# Patient Record
Sex: Male | Born: 1950 | Race: Black or African American | Hispanic: No | Smoking: Former smoker
Health system: Southern US, Community
[De-identification: ages and names within clinical notes are randomized; demographics above are authoritative.]

## PROBLEM LIST (undated history)

## (undated) DIAGNOSIS — I509 Heart failure, unspecified: Secondary | ICD-10-CM

## (undated) DIAGNOSIS — I10 Essential (primary) hypertension: Secondary | ICD-10-CM

## (undated) DIAGNOSIS — F1410 Cocaine abuse, uncomplicated: Secondary | ICD-10-CM

## (undated) DIAGNOSIS — F1010 Alcohol abuse, uncomplicated: Secondary | ICD-10-CM

## (undated) DIAGNOSIS — R0600 Dyspnea, unspecified: Secondary | ICD-10-CM

## (undated) DIAGNOSIS — L0291 Cutaneous abscess, unspecified: Secondary | ICD-10-CM

## (undated) DIAGNOSIS — M51369 Other intervertebral disc degeneration, lumbar region without mention of lumbar back pain or lower extremity pain: Secondary | ICD-10-CM

## (undated) DIAGNOSIS — N400 Enlarged prostate without lower urinary tract symptoms: Secondary | ICD-10-CM

## (undated) DIAGNOSIS — D649 Anemia, unspecified: Secondary | ICD-10-CM

## (undated) DIAGNOSIS — E119 Type 2 diabetes mellitus without complications: Secondary | ICD-10-CM

## (undated) DIAGNOSIS — K219 Gastro-esophageal reflux disease without esophagitis: Secondary | ICD-10-CM

## (undated) DIAGNOSIS — R569 Unspecified convulsions: Secondary | ICD-10-CM

## (undated) DIAGNOSIS — M48061 Spinal stenosis, lumbar region without neurogenic claudication: Secondary | ICD-10-CM

## (undated) DIAGNOSIS — M5136 Other intervertebral disc degeneration, lumbar region: Secondary | ICD-10-CM

## (undated) HISTORY — PX: BACK SURGERY: SHX140

---

## 1998-12-01 ENCOUNTER — Encounter: Payer: Self-pay | Admitting: Emergency Medicine

## 1998-12-01 ENCOUNTER — Emergency Department (HOSPITAL_COMMUNITY): Admission: EM | Admit: 1998-12-01 | Discharge: 1998-12-01 | Payer: Self-pay | Admitting: Emergency Medicine

## 1998-12-27 ENCOUNTER — Encounter: Payer: Self-pay | Admitting: Neurological Surgery

## 1998-12-27 ENCOUNTER — Ambulatory Visit (HOSPITAL_COMMUNITY): Admission: RE | Admit: 1998-12-27 | Discharge: 1998-12-27 | Payer: Self-pay | Admitting: Neurological Surgery

## 1999-01-15 ENCOUNTER — Encounter: Payer: Self-pay | Admitting: Neurological Surgery

## 1999-01-17 ENCOUNTER — Encounter: Payer: Self-pay | Admitting: Neurological Surgery

## 1999-01-17 ENCOUNTER — Inpatient Hospital Stay (HOSPITAL_COMMUNITY): Admission: RE | Admit: 1999-01-17 | Discharge: 1999-01-17 | Payer: Self-pay | Admitting: Neurological Surgery

## 1999-05-26 ENCOUNTER — Encounter: Payer: Self-pay | Admitting: Neurological Surgery

## 1999-05-26 ENCOUNTER — Ambulatory Visit (HOSPITAL_COMMUNITY): Admission: RE | Admit: 1999-05-26 | Discharge: 1999-05-26 | Payer: Self-pay | Admitting: Neurological Surgery

## 2001-01-06 ENCOUNTER — Emergency Department (HOSPITAL_COMMUNITY): Admission: EM | Admit: 2001-01-06 | Discharge: 2001-01-06 | Payer: Self-pay | Admitting: Emergency Medicine

## 2002-08-14 ENCOUNTER — Emergency Department (HOSPITAL_COMMUNITY): Admission: EM | Admit: 2002-08-14 | Discharge: 2002-08-14 | Payer: Self-pay | Admitting: Emergency Medicine

## 2006-07-03 ENCOUNTER — Emergency Department (HOSPITAL_COMMUNITY): Admission: EM | Admit: 2006-07-03 | Discharge: 2006-07-03 | Payer: Self-pay | Admitting: Family Medicine

## 2006-07-06 ENCOUNTER — Emergency Department (HOSPITAL_COMMUNITY): Admission: EM | Admit: 2006-07-06 | Discharge: 2006-07-06 | Payer: Self-pay | Admitting: Emergency Medicine

## 2006-07-07 ENCOUNTER — Observation Stay (HOSPITAL_COMMUNITY): Admission: EM | Admit: 2006-07-07 | Discharge: 2006-07-08 | Payer: Self-pay | Admitting: Emergency Medicine

## 2006-08-06 ENCOUNTER — Emergency Department (HOSPITAL_COMMUNITY): Admission: EM | Admit: 2006-08-06 | Discharge: 2006-08-07 | Payer: Self-pay | Admitting: Emergency Medicine

## 2007-01-07 ENCOUNTER — Emergency Department (HOSPITAL_COMMUNITY): Admission: EM | Admit: 2007-01-07 | Discharge: 2007-01-07 | Payer: Self-pay | Admitting: Family Medicine

## 2007-01-09 ENCOUNTER — Emergency Department (HOSPITAL_COMMUNITY): Admission: EM | Admit: 2007-01-09 | Discharge: 2007-01-09 | Payer: Self-pay | Admitting: Emergency Medicine

## 2007-01-11 ENCOUNTER — Emergency Department (HOSPITAL_COMMUNITY): Admission: EM | Admit: 2007-01-11 | Discharge: 2007-01-11 | Payer: Self-pay | Admitting: Family Medicine

## 2007-01-15 ENCOUNTER — Emergency Department (HOSPITAL_COMMUNITY): Admission: EM | Admit: 2007-01-15 | Discharge: 2007-01-15 | Payer: Self-pay | Admitting: Family Medicine

## 2011-11-18 ENCOUNTER — Emergency Department (HOSPITAL_COMMUNITY)
Admission: EM | Admit: 2011-11-18 | Discharge: 2011-11-18 | Disposition: A | Discharge status: Home / Self Care | Payer: Self-pay | Attending: Emergency Medicine | Admitting: Emergency Medicine

## 2011-11-18 ENCOUNTER — Encounter (HOSPITAL_COMMUNITY): Payer: Self-pay | Admitting: *Deleted

## 2011-11-18 DIAGNOSIS — M5430 Sciatica, unspecified side: Secondary | ICD-10-CM | POA: Insufficient documentation

## 2011-11-18 DIAGNOSIS — I10 Essential (primary) hypertension: Secondary | ICD-10-CM | POA: Insufficient documentation

## 2011-11-18 DIAGNOSIS — R209 Unspecified disturbances of skin sensation: Secondary | ICD-10-CM | POA: Insufficient documentation

## 2011-11-18 DIAGNOSIS — F17200 Nicotine dependence, unspecified, uncomplicated: Secondary | ICD-10-CM | POA: Insufficient documentation

## 2011-11-18 DIAGNOSIS — Z79899 Other long term (current) drug therapy: Secondary | ICD-10-CM | POA: Insufficient documentation

## 2011-11-18 DIAGNOSIS — M549 Dorsalgia, unspecified: Secondary | ICD-10-CM | POA: Insufficient documentation

## 2011-11-18 HISTORY — DX: Gastro-esophageal reflux disease without esophagitis: K21.9

## 2011-11-18 HISTORY — DX: Essential (primary) hypertension: I10

## 2011-11-18 HISTORY — DX: Benign prostatic hyperplasia without lower urinary tract symptoms: N40.0

## 2011-11-18 MED ORDER — DEXAMETHASONE SODIUM PHOSPHATE 10 MG/ML IJ SOLN
10.0000 mg | Freq: Once | INTRAMUSCULAR | Status: DC
  Filled 2011-11-18: qty 1

## 2011-11-18 MED ORDER — METHOCARBAMOL 500 MG PO TABS
500.0000 mg | ORAL_TABLET | Freq: Once | ORAL | Status: AC
  Administered 2011-11-18: 500 mg via ORAL
  Filled 2011-11-18: qty 1

## 2011-11-18 MED ORDER — IBUPROFEN 800 MG PO TABS: 800.0000 mg | ORAL_TABLET | Freq: Three times a day (TID) | ORAL | Status: AC

## 2011-11-18 MED ORDER — METHOCARBAMOL 500 MG PO TABS: 500.0000 mg | ORAL_TABLET | Freq: Two times a day (BID) | ORAL | Status: AC

## 2011-11-18 MED ORDER — HYDROCODONE-ACETAMINOPHEN 5-325 MG PO TABS: 1.0000 | ORAL_TABLET | Freq: Four times a day (QID) | ORAL | Status: AC | PRN

## 2011-11-18 MED ORDER — KETOROLAC TROMETHAMINE 60 MG/2ML IM SOLN
60.0000 mg | Freq: Once | INTRAMUSCULAR | Status: DC
  Filled 2011-11-18: qty 2

## 2011-11-18 MED ORDER — TRAMADOL HCL 50 MG PO TABS: 50.0000 mg | ORAL_TABLET | Freq: Four times a day (QID) | ORAL | Status: AC | PRN

## 2011-11-18 NOTE — ED Provider Notes (Signed)
History     CSN: [id number]  Arrival date & time 11/18/11  1033   First MD Initiated Contact with Patient 11/18/11 1204     HPI Patient reports a significant history of back pain and neck pain. Reports back pain for 8 years has been intermittent. Reports most recent exacerbation was 2 and half weeks ago. Pain is severe in his lower back and radiates down bilateral lower extremities. Denies new injury. Reports associated with numbness sensation mid back. Reports sharp shooting pains down bilateral lower extremities. Denies saddle anesthesias, perineal numbness, abdominal pain, incontinence, urinary symptoms. Reports numbness and tingling in bilateral feet. Patient is a 61 y.o. male presenting with back pain. The history is provided by the patient.  Back Pain  This is a chronic problem. The problem occurs constantly. The problem has been gradually worsening. The pain is associated with no known injury. The pain is present in the lumbar spine. The quality of the pain is described as shooting, stabbing and burning. The pain radiates to the left thigh and right thigh. The pain is severe. The symptoms are aggravated by certain positions. Associated symptoms include numbness, leg pain, paresthesias and tingling. Pertinent negatives include no chest pain, no fever, no headaches, no abdominal pain, no bowel incontinence, no perianal numbness, no bladder incontinence, no dysuria, no pelvic pain, no paresis and no weakness. He has tried NSAIDs for the symptoms. The treatment provided mild relief.    Past Medical History  Diagnosis Date  . Hypertension   . Acid reflux   . Enlarged prostate     Past Surgical History  Procedure Date  . Back surgery     cervical    No family history on file.  History  Substance Use Topics  . Smoking status: Current Everyday Smoker -- 0.5 packs/day  . Smokeless tobacco: Not on file  . Alcohol Use: 7.2 oz/week    12 Cans of beer per week      Review of Systems    Constitutional: Negative for fever and chills.  HENT: Negative for neck pain.   Respiratory: Negative for cough and shortness of breath.   Cardiovascular: Negative for chest pain and palpitations.  Gastrointestinal: Negative for nausea, vomiting, abdominal pain and bowel incontinence.  Genitourinary: Negative for bladder incontinence, dysuria, hematuria, flank pain and pelvic pain.  Musculoskeletal: Positive for back pain. Negative for myalgias and gait problem.       Denies saddle anesthesias, perineal numbness, bowel incontinence, urinary incontinence  Neurological: Positive for tingling, numbness and paresthesias. Negative for dizziness, weakness and headaches.  All other systems reviewed and are negative.    Allergies  Review of patient's allergies indicates no known allergies.  Home Medications   Current Outpatient Rx  Name Route Sig Dispense Refill  . DOXAZOSIN MESYLATE 8 MG PO TABS Oral Take 4 mg by mouth at bedtime. Takes 1/2 tablet    . RANITIDINE HCL 150 MG PO TABS Oral Take 150 mg by mouth 2 (two) times daily.    . TRAMADOL HCL 50 MG PO TABS Oral Take 50 mg by mouth every 6 (six) hours as needed. Pain    . TRIAMTERENE-HCTZ 75-50 MG PO TABS Oral Take 1 tablet by mouth daily.      BP 159/95  Pulse 108  Temp(Src) 98.4 F (36.9 C) (Oral)  Resp 20  Wt 249 lb (112.946 kg)  SpO2 98%  Physical Exam  Constitutional: He is oriented to person, place, and time. He appears well-developed and well-nourished.  HENT:  Head: Normocephalic and atraumatic.  Eyes: Conjunctivae are normal. Pupils are equal, round, and reactive to light.  Neck: Normal range of motion. Neck supple.  Cardiovascular: Normal rate, regular rhythm and normal heart sounds.   Pulmonary/Chest: Effort normal and breath sounds normal.  Abdominal: Soft. Bowel sounds are normal.  Musculoskeletal:       Lumbar back: He exhibits decreased range of motion (patient only able to flex approximately 45.). He exhibits  no tenderness, no bony tenderness, no swelling, no edema, no deformity, no laceration, no pain, no spasm and normal pulse.       Back:  Neurological: He is alert and oriented to person, place, and time.  Skin: Skin is warm and dry. No rash noted. No erythema. No pallor.  Psychiatric: He has a normal mood and affect. His behavior is normal.    ED Course  Procedures  MDM   Due to patient's report of numbness of back, which is a new finding recommended MRI. Will try to order outpatient MRI. Treat with Ultram, Robaxin, prednisone pack. The patient had surgery years ago in 1995 he followed up with Dr. Ellene Route. Will refer back to Dr. Ellene Route. Patient agrees to plan and is ready for discharge.     Sheliah Mends, PA-C 11/18/11 1258

## 2011-11-18 NOTE — Discharge Instructions (Signed)
You have an MRI schelduled for Thursday, 11/20/11 @ 11:45a. Please call back one day after your MRI for results. Followup with Dr. Ellene Route for further management your back pain. You should call and make an appointment.  Back Pain, Adult Low back pain is very common. About 1 in 5 people have back pain.The cause of low back pain is rarely dangerous. The pain often gets better over time.About half of people with a sudden onset of back pain feel better in just 2 weeks. About 8 in 10 people feel better by 6 weeks.  CAUSES Some common causes of back pain include:  Strain of the muscles or ligaments supporting the spine.   Wear and tear (degeneration) of the spinal discs.   Arthritis.   Direct injury to the back.  DIAGNOSIS Most of the time, the direct cause of low back pain is not known.However, back pain can be treated effectively even when the exact cause of the pain is unknown.Answering your caregiver's questions about your overall health and symptoms is one of the most accurate ways to make sure the cause of your pain is not dangerous. If your caregiver needs more information, he or she may order lab work or imaging tests (X-rays or MRIs).However, even if imaging tests show changes in your back, this usually does not require surgery. HOME CARE INSTRUCTIONS For many people, back pain returns.Since low back pain is rarely dangerous, it is often a condition that people can learn to [facility] - [facility] their own.   Remain active. It is stressful on the back to sit or stand in one place. Do not sit, drive, or stand in one place for more than 30 minutes at a time. Take short walks on level surfaces as soon as pain allows.Try to increase the length of time you walk each day.   Do not stay in bed.Resting more than 1 or 2 days can delay your recovery.   Do not avoid exercise or work.Your body is made to move.It is not dangerous to be active, even though your back may hurt.Your back will likely heal faster  if you return to being active before your pain is gone.   Pay attention to your body when you bend and lift. Many people have less discomfortwhen lifting if they bend their knees, keep the load close to their bodies,and avoid twisting. Often, the most comfortable positions are those that put less stress on your recovering back.   Find a comfortable position to sleep. Use a firm mattress and lie on your side with your knees slightly bent. If you lie on your back, put a pillow under your knees.   Only take over-the-counter or prescription medicines as directed by your caregiver. Over-the-counter medicines to reduce pain and inflammation are often the most helpful.Your caregiver may prescribe muscle relaxant drugs.These medicines help dull your pain so you can more quickly return to your normal activities and healthy exercise.   Put ice on the injured area.   Put ice in a plastic bag.   Place a towel between your skin and the bag.   Leave the ice on for 15 to 20 minutes, 3 to 4 times a day for the first 2 to 3 days. After that, ice and heat may be alternated to reduce pain and spasms.   Ask your caregiver about trying back exercises and gentle massage. This may be of some benefit.   Avoid feeling anxious or stressed.Stress increases muscle tension and can worsen back pain.It is important to  recognize when you are anxious or stressed and learn ways to manage it.Exercise is a great option.  SEEK MEDICAL CARE IF:  You have pain that is not relieved with rest or medicine.   You have pain that does not improve in 1 week.   You have new symptoms.   You are generally not feeling well.  SEEK IMMEDIATE MEDICAL CARE IF:   You have pain that radiates from your back into your legs.   You develop new bowel or bladder control problems.   You have unusual weakness or numbness in your arms or legs.   You develop nausea or vomiting.   You develop abdominal pain.   You feel faint.    Document Released: 09/15/2005 Document Revised: 05/28/2011 Document Reviewed: 02/03/2011 [facility] Patient Information 2012 [place].

## 2011-11-20 ENCOUNTER — Ambulatory Visit (HOSPITAL_COMMUNITY)
Admit: 2011-11-20 | Discharge: 2011-11-20 | Disposition: A | Discharge status: Home / Self Care | Payer: Self-pay | Attending: Emergency Medicine | Admitting: Emergency Medicine

## 2011-11-20 DIAGNOSIS — M545 Low back pain, unspecified: Secondary | ICD-10-CM | POA: Insufficient documentation

## 2011-11-20 DIAGNOSIS — M51379 Other intervertebral disc degeneration, lumbosacral region without mention of lumbar back pain or lower extremity pain: Secondary | ICD-10-CM | POA: Insufficient documentation

## 2011-11-20 DIAGNOSIS — M47817 Spondylosis without myelopathy or radiculopathy, lumbosacral region: Secondary | ICD-10-CM | POA: Insufficient documentation

## 2011-11-20 DIAGNOSIS — Q762 Congenital spondylolisthesis: Secondary | ICD-10-CM | POA: Insufficient documentation

## 2011-11-20 DIAGNOSIS — M79609 Pain in unspecified limb: Secondary | ICD-10-CM | POA: Insufficient documentation

## 2011-11-20 DIAGNOSIS — M5137 Other intervertebral disc degeneration, lumbosacral region: Secondary | ICD-10-CM | POA: Insufficient documentation

## 2011-11-20 NOTE — ED Provider Notes (Signed)
Medical screening examination/treatment/procedure(s) were performed by non-physician practitioner and as supervising physician I was immediately available for consultation/collaboration.  Chauncy Passy, MD 11/20/11 [phone]

## 2012-03-11 ENCOUNTER — Emergency Department (HOSPITAL_COMMUNITY)
Admission: EM | Admit: 2012-03-11 | Discharge: 2012-03-12 | Disposition: A | Discharge status: Home / Self Care | Payer: Non-veteran care | Attending: Emergency Medicine | Admitting: Emergency Medicine

## 2012-03-11 ENCOUNTER — Encounter (HOSPITAL_COMMUNITY): Payer: Self-pay | Admitting: *Deleted

## 2012-03-11 ENCOUNTER — Encounter (HOSPITAL_COMMUNITY): Payer: Self-pay | Admitting: Emergency Medicine

## 2012-03-11 ENCOUNTER — Emergency Department (INDEPENDENT_AMBULATORY_CARE_PROVIDER_SITE_OTHER)
Admission: EM | Admit: 2012-03-11 | Discharge: 2012-03-11 | Disposition: A | Discharge status: Nursing Home (Includes ICF) | Payer: Self-pay | Source: Home / Self Care | Attending: Emergency Medicine | Admitting: Emergency Medicine

## 2012-03-11 DIAGNOSIS — N400 Enlarged prostate without lower urinary tract symptoms: Secondary | ICD-10-CM | POA: Insufficient documentation

## 2012-03-11 DIAGNOSIS — E119 Type 2 diabetes mellitus without complications: Secondary | ICD-10-CM | POA: Insufficient documentation

## 2012-03-11 DIAGNOSIS — Z79899 Other long term (current) drug therapy: Secondary | ICD-10-CM | POA: Insufficient documentation

## 2012-03-11 DIAGNOSIS — K219 Gastro-esophageal reflux disease without esophagitis: Secondary | ICD-10-CM | POA: Insufficient documentation

## 2012-03-11 DIAGNOSIS — M5137 Other intervertebral disc degeneration, lumbosacral region: Secondary | ICD-10-CM | POA: Insufficient documentation

## 2012-03-11 DIAGNOSIS — M51379 Other intervertebral disc degeneration, lumbosacral region without mention of lumbar back pain or lower extremity pain: Secondary | ICD-10-CM | POA: Insufficient documentation

## 2012-03-11 DIAGNOSIS — I10 Essential (primary) hypertension: Secondary | ICD-10-CM | POA: Insufficient documentation

## 2012-03-11 DIAGNOSIS — F17200 Nicotine dependence, unspecified, uncomplicated: Secondary | ICD-10-CM | POA: Insufficient documentation

## 2012-03-11 HISTORY — DX: Spinal stenosis, lumbar region without neurogenic claudication: M48.061

## 2012-03-11 HISTORY — DX: Other intervertebral disc degeneration, lumbar region without mention of lumbar back pain or lower extremity pain: M51.369

## 2012-03-11 HISTORY — DX: Other intervertebral disc degeneration, lumbar region: M51.36

## 2012-03-11 LAB — POCT I-STAT 3, VENOUS BLOOD GAS (G3P V)
O2 Saturation: 86 %
TCO2: 26 mmol/L (ref 0–100)

## 2012-03-11 LAB — URINALYSIS, ROUTINE W REFLEX MICROSCOPIC
Bilirubin Urine: NEGATIVE
Glucose, UA: >1000 mg/dL — AB
Hgb urine dipstick: NEGATIVE
Ketones, ur: NEGATIVE mg/dL
Nitrite: NEGATIVE
Specific Gravity, Urine: 1.027 (ref 1.005–1.030)
Specific Gravity, Urine: 1.029 (ref 1.005–1.030)
Urobilinogen, UA: 0.2 mg/dL (ref 0.0–1.0)
Urobilinogen, UA: 0.2 mg/dL (ref 0.0–1.0)
pH: 5.5 (ref 5.0–8.0)
pH: 5.5 (ref 5.0–8.0)

## 2012-03-11 LAB — POCT I-STAT, CHEM 8
BUN: 27 mg/dL — ABNORMAL HIGH (ref 6–23)
Chloride: 98 mEq/L (ref 96–112)
Creatinine, Ser: 1.2 mg/dL (ref 0.50–1.35)
Glucose, Bld: 471 mg/dL — ABNORMAL HIGH (ref 70–99)
Potassium: 4.3 mEq/L (ref 3.5–5.1)

## 2012-03-11 LAB — DIFFERENTIAL
Basophils Absolute: 0 10*3/uL (ref 0.0–0.1)
Eosinophils Relative: 2 % (ref 0–5)
Lymphocytes Relative: 25 % (ref 12–46)
Lymphs Abs: 1.9 10*3/uL (ref 0.7–4.0)
Monocytes Absolute: 0.9 10*3/uL (ref 0.1–1.0)

## 2012-03-11 LAB — CBC
HCT: 41.2 % (ref 39.0–52.0)
Hemoglobin: 14.1 g/dL (ref 13.0–17.0)
MCV: 84.8 fL (ref 78.0–100.0)
RBC: 4.86 MIL/uL (ref 4.22–5.81)
RDW: 12 % (ref 11.5–15.5)

## 2012-03-11 LAB — COMPREHENSIVE METABOLIC PANEL
CO2: 27 mEq/L (ref 19–32)
Calcium: 10.4 mg/dL (ref 8.4–10.5)
Creatinine, Ser: 1.12 mg/dL (ref 0.50–1.35)
GFR calc Af Amer: 79 mL/min — ABNORMAL LOW (ref >90)
GFR calc non Af Amer: 68 mL/min — ABNORMAL LOW (ref >90)
Glucose, Bld: 349 mg/dL — ABNORMAL HIGH (ref 70–99)

## 2012-03-11 LAB — POCT URINALYSIS DIP (DEVICE)
Bilirubin Urine: NEGATIVE
Ketones, ur: NEGATIVE mg/dL
Protein, ur: NEGATIVE mg/dL
Specific Gravity, Urine: 1.01 (ref 1.005–1.030)
pH: 6 (ref 5.0–8.0)

## 2012-03-11 LAB — URINE MICROSCOPIC-ADD ON

## 2012-03-11 LAB — GLUCOSE, CAPILLARY: Glucose-Capillary: 373 mg/dL — ABNORMAL HIGH (ref 70–99)

## 2012-03-11 MED ORDER — SODIUM CHLORIDE 0.9 % IV BOLUS (SEPSIS)
1000.0000 mL | Freq: Once | INTRAVENOUS | Status: AC
  Administered 2012-03-11: 1000 mL via INTRAVENOUS

## 2012-03-11 MED ORDER — INSULIN ASPART 100 UNIT/ML ~~LOC~~ SOLN
5.0000 [IU] | Freq: Once | SUBCUTANEOUS | Status: AC
  Administered 2012-03-11: 23:00:00 via SUBCUTANEOUS
  Filled 2012-03-11: qty 1

## 2012-03-11 NOTE — ED Notes (Signed)
Pt is tx from urgent care, pt sent for further work up of CBG

## 2012-03-11 NOTE — ED Provider Notes (Signed)
History     CSN: [id number]  Arrival date & time 03/11/12  1640   First MD Initiated Contact with Patient 03/11/12 2157      Chief Complaint  Patient presents with  . Hyperglycemia    (Consider location/radiation/quality/duration/timing/severity/associated sxs/prior treatment) HPI Comments: Patient sent from urgent care with new onset hyperglycemia. He was getting a preop physical at the [facility] for lumbar surgery and found to have a random glucose greater than 500. Patient had polyuria and polydipsia for the past 3 weeks. No chest pain, shortness of breath, fever chills or vomiting. Is chronic and foot paresthesias are unchanged. Is a history of spinal stenosis and that is why he is going to have a surgery at the [facility].  The history is provided by the patient.    Past Medical History  Diagnosis Date  . Hypertension   . Acid reflux   . Enlarged prostate   . DDD (degenerative disc disease), lumbar   . Spinal stenosis, lumbar     Past Surgical History  Procedure Date  . Back surgery     cervical    History reviewed. No pertinent family history.  History  Substance Use Topics  . Smoking status: Current Everyday Smoker -- 0.5 packs/day  . Smokeless tobacco: Not on file  . Alcohol Use: 7.2 oz/week    12 Cans of beer per week      Review of Systems  Constitutional: Negative for activity change.  HENT: Negative for congestion and rhinorrhea.   Eyes: Negative for visual disturbance.  Respiratory: Negative for cough, chest tightness and shortness of breath.   Cardiovascular: Negative for chest pain.  Gastrointestinal: Negative for nausea, vomiting and abdominal pain.  Genitourinary: Negative for dysuria.  Musculoskeletal: Positive for gait problem. Negative for back pain.  Neurological: Negative for dizziness and light-headedness.    Allergies  Review of patient's allergies indicates no known allergies.  Home Medications   Current Outpatient Rx  Name Route Sig Dispense  Refill  . DOXAZOSIN MESYLATE 8 MG PO TABS Oral Take 4 mg by mouth at bedtime. Takes 1/2 tablet    . RANITIDINE HCL 150 MG PO TABS Oral Take 150 mg by mouth 2 (two) times daily.    . TRAMADOL HCL 50 MG PO TABS Oral Take 50 mg by mouth every 6 (six) hours as needed. Pain    . TRIAMTERENE-HCTZ 75-50 MG PO TABS Oral Take 1 tablet by mouth daily.    Marland Kitchen METFORMIN HCL 500 MG PO TABS Oral Take 1 tablet (500 mg total) by mouth 2 (two) times daily with a meal. 30 tablet 0    BP 125/79  Pulse 90  Temp 98.6 F (37 C) (Oral)  Resp 14  SpO2 96%  Physical Exam  Constitutional: He is oriented to person, place, and time. He appears well-developed and well-nourished. No distress.  HENT:  Head: Normocephalic.  Mouth/Throat: Oropharynx is clear and moist. No oropharyngeal exudate.  Eyes: Conjunctivae and EOM are normal. Pupils are equal, round, and reactive to light.  Neck: Normal range of motion. Neck supple.  Cardiovascular: Normal rate, regular rhythm and normal heart sounds.   No murmur heard. Pulmonary/Chest: Effort normal and breath sounds normal. No respiratory distress.  Abdominal: Soft. There is no tenderness. There is no rebound and no guarding.  Musculoskeletal: Normal range of motion. He exhibits no edema and no tenderness.  Neurological: He is alert and oriented to person, place, and time. No cranial nerve deficit.  Skin: Skin is warm.  ED Course  Procedures (including critical care time)  Labs Reviewed  URINALYSIS, ROUTINE W REFLEX MICROSCOPIC - Abnormal; Notable for the following:    Glucose, UA >1000 (*)     Hgb urine dipstick TRACE (*)     Leukocytes, UA SMALL (*)     All other components within normal limits  COMPREHENSIVE METABOLIC PANEL - Abnormal; Notable for the following:    Sodium 132 (*)     Chloride 93 (*)     Glucose, Bld 349 (*)     BUN 24 (*)     GFR calc non Af Amer 68 (*)     GFR calc Af Amer 79 (*)     All other components within normal limits  URINE  MICROSCOPIC-ADD ON - Abnormal; Notable for the following:    Squamous Epithelial / LPF FEW (*)     All other components within normal limits  POCT I-STAT 3, BLOOD GAS (G3P V) - Abnormal; Notable for the following:    pH, Ven 7.450 (*)     pCO2, Ven 36.3 (*)     pO2, Ven 48.0 (*)     Bicarbonate 25.2 (*)     All other components within normal limits  GLUCOSE, CAPILLARY - Abnormal; Notable for the following:    Glucose-Capillary 373 (*)     All other components within normal limits  CBC  DIFFERENTIAL  KETONES, QUALITATIVE  BLOOD GAS, VENOUS   No results found.   1. Diabetes mellitus, new onset       MDM  New-onset diabetes without evidence of DKA. Normal anion gap, no ketones in the urine, bicarbonate normal.  Patient tolerating by mouth in the ED. No vomiting, no abdominal pain. She has normal renal function so we'll start low-dose metformin and [facility].  Sugars controlled in the ED with IV fluids.       Ezequiel Essex, MD 03/12/12 [phone]

## 2012-03-11 NOTE — ED Notes (Signed)
Pt is oriented x3 and skin is clammy. Pt had just got out of the [place] which was not running the A/C and the Pt was sweating. Pt is speaking full sentences with out anything difficulty.  Pt state he feels fine and has no pain. Pt states he was at the [facility] in [place], [place], and was told that he had  a sugar reading of 500, and need to come to [facility]cc to be seen. I advice Pt to let us know if anything changed, so we can reevaluate him.

## 2012-03-11 NOTE — ED Notes (Signed)
Patient is scheduled for lumbar surgery 6/18.  Patient went today fro preop.  The preop team noted cbg elevated.  Family member reported a1c was "8 something".  Patient denies any complaints.

## 2012-03-11 NOTE — ED Provider Notes (Signed)
History     CSN: [id number]  Arrival date & time 03/11/12  1324   First MD Initiated Contact with Patient 03/11/12 1326      Chief Complaint  Patient presents with  . Hyperglycemia    (Consider location/radiation/quality/duration/timing/severity/associated sxs/prior treatment) HPI Comments: Patient was getting a preop physical at the [facility] for  Lumbar surgery earlier today. They found a random glucose of about 500. They advised patient to go to the [facility] ER.  Patient reports polyuria, polydipsia for the past 3 weeks. No nausea, vomiting, fevers, chest pain, shortness of breath. No abdominal pain, unintentional weight loss. C/o hand and foot parasthesias but states this has been for "years," he has severe cervical and lumbar DJD, spinal stenosis. It is unchanged recently. He has no diagnosis of diabetes, but per family member, patient's primary care physician in [place] did an A1c earlier this year, and it was over 8.  ROS as noted in HPI. All other ROS negative.    Patient is a 61 y.o. male presenting with diabetes problem. The history is provided by the patient. No language interpreter was used.  Diabetes He presents for his initial diabetic visit. He has type 2 diabetes mellitus. Associated symptoms include polydipsia and polyuria. Pertinent negatives for diabetes include no blurred vision, no chest pain, no fatigue, no visual change, no weakness and no weight loss. Risk factors for coronary artery disease include hypertension, male sex and obesity.    Past Medical History  Diagnosis Date  . Hypertension   . Acid reflux   . Enlarged prostate   . DDD (degenerative disc disease), lumbar   . Spinal stenosis, lumbar     Past Surgical History  Procedure Date  . Back surgery     cervical    History reviewed. No pertinent family history.  History  Substance Use Topics  . Smoking status: Current Everyday Smoker -- 0.5 packs/day  . Smokeless tobacco: Not on file  . Alcohol  Use: 7.2 oz/week    12 Cans of beer per week      Review of Systems  Constitutional: Negative for weight loss and fatigue.  Eyes: Negative for blurred vision.  Cardiovascular: Negative for chest pain.  Genitourinary: Positive for polyuria.  Neurological: Negative for weakness.  Hematological: Positive for polydipsia.    Allergies  Review of patient's allergies indicates no known allergies.  Home Medications   Current Outpatient Rx  Name Route Sig Dispense Refill  . OXYCODONE HCL PO Oral Take by mouth.    Marland Kitchen DOXAZOSIN MESYLATE 8 MG PO TABS Oral Take 4 mg by mouth at bedtime. Takes 1/2 tablet    . RANITIDINE HCL 150 MG PO TABS Oral Take 150 mg by mouth 2 (two) times daily.    . TRAMADOL HCL 50 MG PO TABS Oral Take 50 mg by mouth every 6 (six) hours as needed. Pain    . TRIAMTERENE-HCTZ 75-50 MG PO TABS Oral Take 1 tablet by mouth daily.      BP 135/82  Pulse 98  Temp 97.7 F (36.5 C) (Oral)  Resp 20  SpO2 96%  Physical Exam  Nursing note and vitals reviewed. Constitutional: He is oriented to person, place, and time. He appears well-developed and well-nourished.  HENT:  Head: Normocephalic and atraumatic.  Eyes: Conjunctivae and EOM are normal.  Neck: Normal range of motion.  Cardiovascular: Normal rate, regular rhythm and normal heart sounds.   Pulmonary/Chest: Effort normal and breath sounds normal. No respiratory distress.  Abdominal: Bowel  sounds are normal. He exhibits no distension. There is no tenderness.  Musculoskeletal: Normal range of motion.  Neurological: He is alert and oriented to person, place, and time.  Skin: Skin is warm and dry.  Psychiatric: He has a normal mood and affect. His behavior is normal.    ED Course  Procedures (including critical care time)  Labs Reviewed  POCT I-STAT, CHEM 8 - Abnormal; Notable for the following:    Sodium 133 (*)     BUN 27 (*)     Glucose, Bld 471 (*)     All other components within normal limits  POCT  URINALYSIS DIP (DEVICE) - Abnormal; Notable for the following:    Glucose, UA 500 (*)     Hgb urine dipstick TRACE (*)     All other components within normal limits  URINALYSIS, ROUTINE W REFLEX MICROSCOPIC   No results found.   1. Diabetes mellitus, new onset     Results for orders placed during the hospital encounter of 03/11/12  POCT I-STAT, CHEM 8      Component Value Range   Sodium 133 (*) 135 - 145 mEq/L   Potassium 4.3  3.5 - 5.1 mEq/L   Chloride 98  96 - 112 mEq/L   BUN 27 (*) 6 - 23 mg/dL   Creatinine, Ser 1.20  0.50 - 1.35 mg/dL   Glucose, Bld 471 (*) 70 - 99 mg/dL   Calcium, Ion 1.24  1.12 - 1.32 mmol/L   TCO2 26  0 - 100 mmol/L   Hemoglobin 15.0  13.0 - 17.0 g/dL   HCT 44.0  39.0 - 52.0 %  POCT URINALYSIS DIP (DEVICE)      Component Value Range   Glucose, UA 500 (*) NEGATIVE mg/dL   Bilirubin Urine NEGATIVE  NEGATIVE   Ketones, ur NEGATIVE  NEGATIVE mg/dL   Specific Gravity, Urine 1.010  1.005 - 1.030   Hgb urine dipstick TRACE (*) NEGATIVE   pH 6.0  5.0 - 8.0   Protein, ur NEGATIVE  NEGATIVE mg/dL   Urobilinogen, UA 0.2  0.0 - 1.0 mg/dL   Nitrite NEGATIVE  NEGATIVE   Leukocytes, UA NEGATIVE  NEGATIVE    MDM  Previous records reviewed. As noted in history of present illness.  Patient was last seen in February 2013 for back pain. No diagnosis of diabetes. No previous labs available.  EKG: NSR rate 79 nml axis, nml intrervals no hypertrophy no sttw changes. No previous EKG for comparison.   Pt with new onset DM. No evidence of MI, acidosis, ketosis, but pt is elderly. Transferring for IVF, glucommander protocol.   Cherly Beach, MD 03/11/12 [phone]

## 2012-03-11 NOTE — ED Notes (Signed)
Tim Bradley) [phone]

## 2012-03-11 NOTE — ED Notes (Signed)
Sent to ED from [facility] for eval of hyperglycemia. Pt states he was just told today that he was a diabetic. Pt had pre-op labs for back surgery done in [place], called today and told to come to hospital. Martin Majestic to [facility] and sent to ED. Denies pain. Aaox4. Appears in NAD

## 2012-03-12 LAB — GLUCOSE, CAPILLARY: Glucose-Capillary: 272 mg/dL — ABNORMAL HIGH (ref 70–99)

## 2012-03-12 MED ORDER — METFORMIN HCL 500 MG PO TABS
500.0000 mg | ORAL_TABLET | Freq: Once | ORAL | Status: AC
  Administered 2012-03-12: 500 mg via ORAL
  Filled 2012-03-12: qty 1

## 2012-03-12 MED ORDER — METFORMIN HCL 500 MG PO TABS: 500.0000 mg | ORAL_TABLET | Freq: Two times a day (BID) | ORAL | Status: DC

## 2012-03-12 NOTE — ED Notes (Signed)
224

## 2012-03-12 NOTE — Discharge Instructions (Signed)
Diabetes, Type 2 Take the medication metformin as prescribed. Follow up with your doctor at the [facility] for recheck of your diabetes. Return to the ED if you develop new or worsening symptoms. Diabetes is a long-lasting (chronic) disease. In type 2 diabetes, the pancreas does not make enough insulin (a hormone), and the body does not respond normally to the insulin that is made. This type of diabetes was also previously called adult-onset diabetes. It usually occurs after the age of 78, but it can occur at any age.  CAUSES  Type 2 diabetes happens because the pancreasis not making enough insulin or your body has trouble using the insulin that your pancreas does make properly. SYMPTOMS   Drinking more than usual.   Urinating more than usual.   Blurred vision.   Dry, itchy skin.   Frequent infections.   Feeling more tired than usual (fatigue).  DIAGNOSIS The diagnosis of type 2 diabetes is usually made by one of the following tests:  Fasting blood glucose test. You will not eat for at least 8 hours and then take a blood test.   Random blood glucose test. Your blood glucose (sugar) is checked at any time of the day regardless of when you ate.   Oral glucose tolerance test (OGTT). Your blood glucose is measured after you have not eaten (fasted) and then after you drink a glucose containing beverage.  TREATMENT   Healthy eating.   Exercise.   Medicine, if needed.   Monitoring blood glucose.   Seeing your caregiver regularly.  HOME CARE INSTRUCTIONS   Check your blood glucose at least once a day. More frequent monitoring may be necessary, depending on your medicines and on how well your diabetes is controlled. Your caregiver will advise you.   Take your medicine as directed by your caregiver.   Do not smoke.   Make wise food choices. Ask your caregiver for information. Weight loss can improve your diabetes.   Learn about low blood glucose (hypoglycemia) and how to treat it.    Get your eyes checked regularly.   Have a yearly physical exam. Have your blood pressure checked and your blood and urine tested.   Wear a pendant or bracelet saying that you have diabetes.   Check your feet every night for cuts, sores, blisters, and redness. Let your caregiver know if you have any problems.  SEEK MEDICAL CARE IF:   You have problems keeping your blood glucose in target range.   You have problems with your medicines.   You have symptoms of an illness that do not improve after 24 hours.   You have a sore or wound that is not healing.   You notice a change in vision or a new problem with your vision.   You have a fever.  MAKE SURE YOU:  Understand these instructions.   Will watch your condition.   Will get help right away if you are not doing well or get worse.  Document Released: 09/15/2005 Document Revised: 09/04/2011 Document Reviewed: 03/03/2011 [facility] Patient Information 2012 [place].

## 2012-03-12 NOTE — ED Notes (Signed)
Pharmacy notified for metformin

## 2014-08-02 ENCOUNTER — Encounter (HOSPITAL_COMMUNITY): Payer: Self-pay | Admitting: Emergency Medicine

## 2014-08-02 ENCOUNTER — Emergency Department (HOSPITAL_COMMUNITY): Payer: Non-veteran care

## 2014-08-02 ENCOUNTER — Emergency Department (HOSPITAL_COMMUNITY)
Admission: EM | Admit: 2014-08-02 | Discharge: 2014-08-03 | Disposition: A | Discharge status: Home / Self Care | Payer: Non-veteran care | Attending: Emergency Medicine | Admitting: Emergency Medicine

## 2014-08-02 DIAGNOSIS — E119 Type 2 diabetes mellitus without complications: Secondary | ICD-10-CM | POA: Diagnosis not present

## 2014-08-02 DIAGNOSIS — Z720 Tobacco use: Secondary | ICD-10-CM | POA: Insufficient documentation

## 2014-08-02 DIAGNOSIS — K219 Gastro-esophageal reflux disease without esophagitis: Secondary | ICD-10-CM | POA: Diagnosis not present

## 2014-08-02 DIAGNOSIS — M79652 Pain in left thigh: Secondary | ICD-10-CM | POA: Diagnosis not present

## 2014-08-02 DIAGNOSIS — Z9889 Other specified postprocedural states: Secondary | ICD-10-CM | POA: Insufficient documentation

## 2014-08-02 DIAGNOSIS — N400 Enlarged prostate without lower urinary tract symptoms: Secondary | ICD-10-CM | POA: Insufficient documentation

## 2014-08-02 DIAGNOSIS — Z79899 Other long term (current) drug therapy: Secondary | ICD-10-CM | POA: Insufficient documentation

## 2014-08-02 DIAGNOSIS — R1032 Left lower quadrant pain: Secondary | ICD-10-CM

## 2014-08-02 DIAGNOSIS — M25559 Pain in unspecified hip: Secondary | ICD-10-CM

## 2014-08-02 DIAGNOSIS — I10 Essential (primary) hypertension: Secondary | ICD-10-CM | POA: Diagnosis not present

## 2014-08-02 HISTORY — DX: Type 2 diabetes mellitus without complications: E11.9

## 2014-08-02 MED ORDER — OXYCODONE-ACETAMINOPHEN 5-325 MG PO TABS
2.0000 | ORAL_TABLET | Freq: Once | ORAL | Status: AC
  Administered 2014-08-02: 2 via ORAL
  Filled 2014-08-02: qty 2

## 2014-08-02 NOTE — ED Notes (Signed)
The patient said he started having leg pain a couple of days ago.  He said he can walk but the pain is excruciating.  Pulses are good and the leg color is appropriate for ethnicity.

## 2014-08-02 NOTE — ED Provider Notes (Signed)
CSN: [id number]     Arrival date & time 08/02/14  2025 History  This chart was scribed for non-physician practitioner, Alecia Lemming, PA-C working with Leota Jacobsen, MD, and Varney Biles MD, by Erling Conte, ED Scribe. This patient was seen in room TR07C/TR07C and the patient's care was started at 9:20 PM.   Chief Complaint  Patient presents with  . Leg Pain    The patient said he started having leg pain a couple of days ago.  He said he can walk but the pain is excruciating.    The history is provided by the patient. No language interpreter was used.    HPI Comments: Tim Bradley is a 63 y.o. male with a h/o HTN, acid reflux, enlarged prostate, lumbar DDD, lumbar spinal stenosis, DM w/o complication who presents to the Emergency Department complaining of constant, gradually worsening, "excruciating", "sharp", left leg pain that began 2 days ago.Pain began as an ache and gradually worsened. Pt states that the pain is localized in the medial upper left thigh. Pt states he is having associated difficulty walking and difficulty putting pressure on the leg. He states last night he was trying to walk and he fell twice. He denies any LOC or head injury. He notes the pain is so bad it is even difficult to stand up. Denies any known injury for the pain. He denies taking any prednisone. Pt takes Tramadol for his frequent headaches. He denies any h/o DVT. Pt denies any swelling, fever, recent weight loss, color change in the leg or testicular pain. Pt has a h/o of bone graft taken from left iliac crest.    Past Medical History  Diagnosis Date  . Hypertension   . Acid reflux   . Enlarged prostate   . DDD (degenerative disc disease), lumbar   . Spinal stenosis, lumbar   . Diabetes mellitus without complication    Past Surgical History  Procedure Laterality Date  . Back surgery      cervical   History reviewed. No pertinent family history. History  Substance Use Topics  . Smoking status:  Current Every Day Smoker -- 0.50 packs/day  . Smokeless tobacco: Not on file  . Alcohol Use: 7.2 oz/week    12 Cans of beer per week    Review of Systems  Constitutional: Negative for fever, chills and unexpected weight change.  Cardiovascular: Negative for leg swelling.  Gastrointestinal: Negative for constipation.       Neg for fecal incontinence  Genitourinary: Negative for hematuria, flank pain, difficulty urinating and testicular pain.       Negative for urinary incontinence or retention  Musculoskeletal: Positive for myalgias (left leg), back pain, arthralgias (left leg) and gait problem. Negative for joint swelling.  Skin: Negative for color change.  Neurological: Negative for weakness and numbness.       Negative for saddle paresthesias       Allergies  Review of patient's allergies indicates no known allergies.  Home Medications   Prior to Admission medications   Medication Sig Start Date End Date Taking? Authorizing Provider  doxazosin (CARDURA) 8 MG tablet Take 4 mg by mouth at bedtime. Takes 1/2 tablet    Historical Provider, MD  metFORMIN (GLUCOPHAGE) 500 MG tablet Take 1 tablet (500 mg total) by mouth 2 (two) times daily with a meal. 03/12/12 03/12/13  Ezequiel Essex, MD  ranitidine (ZANTAC) 150 MG tablet Take 150 mg by mouth 2 (two) times daily.    Historical Provider, MD  traMADol (ULTRAM) 50 MG tablet Take 50 mg by mouth every 6 (six) hours as needed. Pain    Historical Provider, MD  triamterene-hydrochlorothiazide (MAXZIDE) 75-50 MG per tablet Take 1 tablet by mouth daily.    Historical Provider, MD   Triage Vitals: BP 128/88 mmHg  Pulse 86  Temp(Src) 99 F (37.2 C)  Resp 18  Ht 6\' 2"  (1.88 m)  Wt 244 lb (110.678 kg)  BMI 31.31 kg/m2  SpO2 99%  Physical Exam  Constitutional: He appears well-developed and well-nourished.  HENT:  Head: Normocephalic and atraumatic.  Eyes: Conjunctivae are normal.  Neck: Normal range of motion.  Abdominal: Soft. There  is no tenderness. There is no CVA tenderness.  Genitourinary: Right testis shows no tenderness. Left testis shows no tenderness.  Musculoskeletal: He exhibits tenderness. He exhibits no edema.       Left hip: He exhibits decreased strength, tenderness and bony tenderness. He exhibits normal range of motion.       Thoracic back: He exhibits normal range of motion, no tenderness and no bony tenderness.       Lumbar back: Normal. He exhibits normal range of motion, no tenderness and no bony tenderness.       Right upper leg: He exhibits no tenderness, no bony tenderness and no swelling.       Left upper leg: He exhibits no tenderness, no bony tenderness and no swelling.       Legs: No step-off noted with palpation of spine.   Neurological: He is alert. He has normal reflexes. No sensory deficit. He exhibits normal muscle tone.  5/5 strength in entire lower extremities bilaterally. No sensation deficit.   Skin: Skin is warm and dry.  Psychiatric: He has a normal mood and affect.  Nursing note and vitals reviewed.   ED Course  Procedures (including critical care time)  DIAGNOSTIC STUDIES: Oxygen Saturation is 99% on RA, normal by my interpretation.    COORDINATION OF CARE: 9:25 PM- Will order Percocet and diagnostic imaging of left hip.  Pt advised of plan for treatment and pt agrees.     Labs Review Labs Reviewed - No data to display  Imaging Review Dg Hip Complete Left  08/02/2014   CLINICAL DATA:  One day history of pain.  No known trauma  EXAM: LEFT HIP - COMPLETE 2+ VIEW  COMPARISON:  None.  FINDINGS: Frontal pelvis as well as frontal and lateral left hip images were obtained. There is no demonstrable fracture or dislocation. There is slight narrowing of both hip joints. There is calcification slightly superior to the greater trochanter on the left, a finding that is likely due to ligamentous calcification.  There is a focal expansile lesion arising from the left iliac crest  measuring 4.9 x 3.8 cm. This mass has a lucent central region with a sclerotic periphery.  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 Its etiology is uncertain. Question whether the patient has had a graft taken from this area with reactive change following surgery in this area. If there is a history of prior surgery in this area, he would be reasonable to consider follow-up radiographic examination in 3 months to assess for stability. If the patient does not have history of prior surgery involving this area bone, nonemergent CT of the pelvis to further evaluate this lesion would be reasonable.  There is apparent enthesopathic calcification near the greater trochanter on the left.  There is mild  narrowing of both hip joints. No fracture or dislocation. No erosive change.   Electronically Signed   By: Lowella Grip M.D.   On: 08/02/2014 21:54     EKG Interpretation None      Patient was initially discussed after imaging with Dr. Zenia Resides. Patient is really having a tough time standing. CT ordered.   CT was neg except for arthritis. Patient discussed with and seen by Dr. Kathrynn Humble. Doubt septic joint. Patient is more mobile but still with significant pain with hip flexion.   Plan: check CK (pt ultimately refused), additional pain medication, crutches. Patient has [facility] at [facility] next week.   1:15 AM   Patient ambulatory with support of crutches. He is ready to go home.  Patient counseled on use of narcotic pain medications. Counseled not to combine these medications with others containing tylenol. Urged not to drink alcohol, drive, or perform any other activities that requires focus while taking these medications. The patient verbalizes understanding and agrees with the plan.  No red flag s/s of low back pain. Patient was counseled on back pain precautions and told to do activity as tolerated but do not lift, push, or pull heavy objects more  than 10 pounds for the next week.  Patient counseled to use ice or heat on back for no longer than 15 minutes every hour.   Patient prescribed muscle relaxer and counseled on proper use of muscle relaxant medication.    Patient prescribed narcotic pain medicine and counseled on proper use of narcotic pain medications. Counseled not to combine this medication with others containing tylenol.   Urged patient not to drink alcohol, drive, or perform any other activities that requires focus while taking either of these medications.  Patient urged to follow-up with PCP if pain does not improve with treatment and rest or if pain becomes recurrent. Urged to return with worsening severe pain, loss of bowel or bladder control, trouble walking.   The patient verbalizes understanding and agrees with the plan.     MDM   Final diagnoses:  Groin pain, left  Hip pain   Patient with pain and trouble walking. Pain is isolated to the left groin. There is no radicular features of pain or weakness in his leg. There is no swelling of the leg or signs of DVT. Given location of pain, patient had imaging of hip which was negative. Patient evaluated by Dr. Kathrynn Humble. We do not suspect septic joint. Pain seems to be related to a hip flexor muscle. Patient treated with pain and given crutches for mobility assistance. Patient has follow-up next week lower extremity is neurovascularly intact with good pulses.  I personally performed the services described in this documentation, which was scribed in my presence. The recorded information has been reviewed and is accurate.    Carlisle Cater, PA-C 08/03/14 [place], MD 08/03/14 [phone]

## 2014-08-02 NOTE — ED Notes (Signed)
Patient transported to X-ray 

## 2014-08-03 MED ORDER — OXYCODONE HCL 5 MG PO TABS
10.0000 mg | ORAL_TABLET | Freq: Once | ORAL | Status: AC
  Administered 2014-08-03: 10 mg via ORAL
  Filled 2014-08-03: qty 2

## 2014-08-03 MED ORDER — OXYCODONE-ACETAMINOPHEN 5-325 MG PO TABS: 1.0000 | ORAL_TABLET | Freq: Four times a day (QID) | ORAL | Status: DC | PRN

## 2014-08-03 MED ORDER — METHOCARBAMOL 500 MG PO TABS: 500.0000 mg | ORAL_TABLET | Freq: Two times a day (BID) | ORAL | Status: DC

## 2014-08-03 NOTE — Discharge Instructions (Signed)
Please read and follow all provided instructions.  Your diagnoses today include:  1. Groin pain, left   2. Hip pain     Tests performed today include:  Vital signs - see below for your results today  X-ray of hip - normal  CT of hip - mild arthritis, no other problems  Medications prescribed:   Percocet (oxycodone/acetaminophen) - narcotic pain medication  DO NOT drive or perform any activities that require you to be awake and alert because this medicine can make you drowsy. BE VERY CAREFUL not to take multiple medicines containing Tylenol (also called acetaminophen). Doing so can lead to an overdose which can damage your liver and cause liver failure and possibly death.   Robaxin (methocarbamol) - muscle relaxer medication  DO NOT drive or perform any activities that require you to be awake and alert because this medicine can make you drowsy.   Take any prescribed medications only as directed.  Home care instructions:   Follow any educational materials contained in this packet  Please rest, use ice or heat on your back for the next several days  Do not lift, push, pull anything more than 10 pounds for the next week  Follow-up instructions: Please follow-up with your primary care provider in the next 3 days for further evaluation of your symptoms.   Return instructions:  SEEK IMMEDIATE MEDICAL ATTENTION IF YOU HAVE:  New numbness, tingling, weakness, or problem with the use of your arms or legs  Severe back pain not relieved with medications  Loss control of your bowels or bladder  Increasing pain in any areas of the body (such as chest or abdominal pain)  Shortness of breath, dizziness, or fainting.   Worsening nausea (feeling sick to your stomach), vomiting, fever, or sweats  Any other emergent concerns regarding your health   Additional Information:  Your vital signs today were: BP 133/91 mmHg   Pulse 91   Temp(Src) 98.3 F (36.8 C) (Oral)   Resp 18   Ht  6\' 2"  (1.88 m)   Wt 244 lb (110.678 kg)   BMI 31.31 kg/m2   SpO2 98% If your blood pressure (BP) was elevated above 135/85 this visit, please have this repeated by your doctor within one month. --------------

## 2017-02-25 ENCOUNTER — Emergency Department (HOSPITAL_COMMUNITY)
Admission: EM | Admit: 2017-02-25 | Discharge: 2017-02-25 | Disposition: A | Discharge status: Home / Self Care | Payer: Medicare Other | Attending: Emergency Medicine | Admitting: Emergency Medicine

## 2017-02-25 ENCOUNTER — Encounter (HOSPITAL_COMMUNITY): Payer: Self-pay

## 2017-02-25 DIAGNOSIS — Z79899 Other long term (current) drug therapy: Secondary | ICD-10-CM | POA: Diagnosis not present

## 2017-02-25 DIAGNOSIS — Z7984 Long term (current) use of oral hypoglycemic drugs: Secondary | ICD-10-CM | POA: Insufficient documentation

## 2017-02-25 DIAGNOSIS — I10 Essential (primary) hypertension: Secondary | ICD-10-CM | POA: Insufficient documentation

## 2017-02-25 DIAGNOSIS — M545 Low back pain, unspecified: Secondary | ICD-10-CM

## 2017-02-25 DIAGNOSIS — E119 Type 2 diabetes mellitus without complications: Secondary | ICD-10-CM | POA: Insufficient documentation

## 2017-02-25 DIAGNOSIS — F17200 Nicotine dependence, unspecified, uncomplicated: Secondary | ICD-10-CM | POA: Diagnosis not present

## 2017-02-25 MED ORDER — HYDROCODONE-ACETAMINOPHEN 5-325 MG PO TABS
1.0000 | ORAL_TABLET | Freq: Once | ORAL | Status: AC
  Administered 2017-02-25: 1 via ORAL
  Filled 2017-02-25: qty 1

## 2017-02-25 MED ORDER — HYDROCODONE-ACETAMINOPHEN 5-325 MG PO TABS: 1.0000 | ORAL_TABLET | Freq: Four times a day (QID) | ORAL | 0 refills | Status: DC | PRN

## 2017-02-25 NOTE — Discharge Instructions (Signed)
Take the pain medicine as directed. [facility] Return for any new or worse symptoms.

## 2017-02-25 NOTE — ED Triage Notes (Signed)
Patient complains of chronic lower back pain that is worse x 1 week after moving boxes around. Alert and oriented

## 2017-02-25 NOTE — ED Provider Notes (Signed)
[facility] DEPT Provider Note   CSN: [id number] Arrival date & time: 02/25/17  1513  By signing my name below, I, Levester Fresh, attest that this documentation has been prepared under the direction and in the presence of Fredia Sorrow, MD . Electronically Signed: Levester Fresh, Scribe. 02/25/2017. 5:58 PM.  History   Chief Complaint Back Pain  HPI Comments BUZZ AXEL is a 66 y.o. male with a PMHx significant for lumbar degenerative disc disease, spinal stenosis, HTN and DM, who presents to the Emergency Department with complaints of his chronic bilateral lower back pain x3 days, which worsened after picking up a box.  Notes radiation to BLE.  Pain worse wityh movement. Numbness and weakness to LE at baseline with no recent changes. Pain has been constant since onset, rated a 8/10 in severity. No bladder or bowel incontinence.  No recent falls. No alleviating factors noted.  Sx not relieved by tramadol.  Pt denies experiencing any other acute sx. Reports no known drug allergies.   The history is provided by the patient and medical records. No language interpreter was used.   Past Medical History:  Diagnosis Date  . Acid reflux   . DDD (degenerative disc disease), lumbar   . Diabetes mellitus without complication ([place])   . Enlarged prostate   . Hypertension   . Spinal stenosis, lumbar     There are no active problems to display for this patient.   Past Surgical History:  Procedure Laterality Date  . BACK SURGERY     cervical       Home Medications    Prior to Admission medications   Medication Sig Start Date End Date Taking? Authorizing Provider  doxazosin (CARDURA) 8 MG tablet Take 4 mg by mouth at bedtime. Takes 1/2 tablet    [provider]  HYDROcodone-acetaminophen (NORCO/VICODIN) 5-325 MG tablet Take 1-2 tablets by mouth every 6 (six) hours as needed for moderate pain. 02/25/17   Fredia Sorrow, MD  metFORMIN (GLUCOPHAGE) 1000 MG tablet Take  1,000 mg by mouth 2 (two) times daily with a meal.    [provider]  metFORMIN (GLUCOPHAGE) 500 MG tablet Take 1 tablet (500 mg total) by mouth 2 (two) times daily with a meal. 03/12/12 03/12/13  Rancour, Annie Main, MD  methocarbamol (ROBAXIN) 500 MG tablet Take 1 tablet (500 mg total) by mouth 2 (two) times daily. 08/03/14   Carlisle Cater, PA-C  oxyCODONE-acetaminophen (PERCOCET/ROXICET) 5-325 MG per tablet Take 1-2 tablets by mouth every 6 (six) hours as needed for severe pain. 08/03/14   Carlisle Cater, PA-C  ranitidine (ZANTAC) 150 MG tablet Take 150 mg by mouth 2 (two) times daily.    [provider]  traMADol (ULTRAM) 50 MG tablet Take 50 mg by mouth every 6 (six) hours as needed. Pain    [provider]  triamterene-hydrochlorothiazide (MAXZIDE) 75-50 MG per tablet Take 1 tablet by mouth daily.    [provider]    Family History No family history on file.  Social History Social History  Substance Use Topics  . Smoking status: Current Every Day Smoker    Packs/day: 0.50  . Smokeless tobacco: Not on file  . Alcohol use 7.2 oz/week    12 Cans of beer per week     Allergies   Patient has no known allergies.   Review of Systems Review of Systems  Constitutional: Negative for chills and fever.  HENT: Negative for rhinorrhea and sore throat.   Eyes: Negative  for visual disturbance.  Respiratory: Negative for cough and shortness of breath.   Cardiovascular: Negative for chest pain and leg swelling.  Gastrointestinal: Negative for abdominal pain, diarrhea, nausea and vomiting.  Genitourinary: Negative for difficulty urinating and hematuria.  Musculoskeletal: Positive for back pain.  Skin: Negative for rash.  Neurological: Positive for weakness ([name] baseline) and numbness ([name] baseline). Negative for headaches.  Hematological: Does not bruise/bleed easily.   Physical Exam Updated Vital Signs BP (!) 139/92   Pulse 94   Temp 98.8 F (37.1  C) (Oral)   Resp 18   SpO2 99%   Physical Exam  Constitutional: He is oriented to person, place, and time. He appears well-developed and well-nourished. No distress.  HENT:  Head: Normocephalic and atraumatic.  Mouth/Throat: Mucous membranes are normal.  Eyes: Conjunctivae and EOM are normal. Pupils are equal, round, and reactive to light. No scleral icterus.  Pulmonary/Chest: Effort normal and breath sounds normal. He has no wheezes. He has no rales. He exhibits no tenderness.  Abdominal: Soft. Bowel sounds are normal. There is no tenderness.  Musculoskeletal: Normal range of motion. He exhibits no edema.  No pitting edema.  Lower lumbar area, midline and bilateral with tenderness to palpation. No appreciable spasms.  Neurological: He is alert and oriented to person, place, and time. No cranial nerve deficit or sensory deficit. He exhibits normal muscle tone. Coordination normal.  Nursing note and vitals reviewed.  ED Treatments / Results  DIAGNOSTIC STUDIES: Oxygen Saturation is 99% on room air, normal by my interpretation.    COORDINATION OF CARE: 4:58 PM Discussed treatment plan with pt at bedside and pt agreed to plan.  Labs (all labs ordered are listed, but only abnormal results are displayed) Labs Reviewed - No data to display  EKG  EKG Interpretation None      Radiology No results found.  Procedures Procedures (including critical care time)  Medications Ordered in ED Medications  HYDROcodone-acetaminophen (NORCO/VICODIN) 5-325 MG per tablet 1 tablet (not administered)     Initial Impression / Assessment and Plan / ED Course  I have reviewed the triage vital signs and the nursing notes.  Pertinent labs & imaging results that were available during my care of the patient were reviewed by me and considered in my medical decision making (see chart for details).    Patient with complaint of back pain for the past few days. Patient's had long-standing lower  extremity weakness and some numbness. But nothing new or worse. No fall or injury. Will treat symptomatically. Patient has follow-up with the [facility] hospital next week.well or a limited abdominal by [place] on other 25 to be started worrying [place] a local urgent ring of the local people first with his surgeon in be a is there a cure so where this is there still with this point somewhere else in [place] and air and  I personally performed the services described in this documentation, which was scribed in my presence. The recorded information has been reviewed and is accurate.    Final Clinical Impressions(s) / ED Diagnoses   Final diagnoses:  Acute bilateral low back pain without sciatica    New Prescriptions New Prescriptions   HYDROCODONE-ACETAMINOPHEN (NORCO/VICODIN) 5-325 MG TABLET    Take 1-2 tablets by mouth every 6 (six) hours as needed for moderate pain.     Fredia Sorrow, MD 02/25/17 [phone]

## 2017-02-27 DIAGNOSIS — L0291 Cutaneous abscess, unspecified: Secondary | ICD-10-CM

## 2017-02-27 HISTORY — DX: Cutaneous abscess, unspecified: L02.91

## 2017-03-25 ENCOUNTER — Encounter (HOSPITAL_COMMUNITY): Payer: Self-pay | Admitting: Emergency Medicine

## 2017-03-25 ENCOUNTER — Inpatient Hospital Stay (HOSPITAL_COMMUNITY)
Admission: EM | Admit: 2017-03-25 | Discharge: 2017-03-28 | DRG: 344 | Disposition: A | Discharge status: Home / Self Care | Payer: Medicare Other | Attending: General Surgery | Admitting: General Surgery

## 2017-03-25 DIAGNOSIS — N179 Acute kidney failure, unspecified: Secondary | ICD-10-CM | POA: Diagnosis present

## 2017-03-25 DIAGNOSIS — Z79899 Other long term (current) drug therapy: Secondary | ICD-10-CM

## 2017-03-25 DIAGNOSIS — N400 Enlarged prostate without lower urinary tract symptoms: Secondary | ICD-10-CM | POA: Diagnosis present

## 2017-03-25 DIAGNOSIS — F17210 Nicotine dependence, cigarettes, uncomplicated: Secondary | ICD-10-CM | POA: Diagnosis present

## 2017-03-25 DIAGNOSIS — E1169 Type 2 diabetes mellitus with other specified complication: Secondary | ICD-10-CM

## 2017-03-25 DIAGNOSIS — E871 Hypo-osmolality and hyponatremia: Secondary | ICD-10-CM | POA: Diagnosis not present

## 2017-03-25 DIAGNOSIS — K611 Rectal abscess: Secondary | ICD-10-CM | POA: Diagnosis not present

## 2017-03-25 DIAGNOSIS — E279 Disorder of adrenal gland, unspecified: Secondary | ICD-10-CM | POA: Diagnosis present

## 2017-03-25 DIAGNOSIS — I10 Essential (primary) hypertension: Secondary | ICD-10-CM | POA: Diagnosis present

## 2017-03-25 DIAGNOSIS — E119 Type 2 diabetes mellitus without complications: Secondary | ICD-10-CM | POA: Diagnosis present

## 2017-03-25 DIAGNOSIS — M48061 Spinal stenosis, lumbar region without neurogenic claudication: Secondary | ICD-10-CM | POA: Diagnosis present

## 2017-03-25 DIAGNOSIS — Z7984 Long term (current) use of oral hypoglycemic drugs: Secondary | ICD-10-CM

## 2017-03-25 DIAGNOSIS — K5900 Constipation, unspecified: Secondary | ICD-10-CM | POA: Diagnosis present

## 2017-03-25 DIAGNOSIS — E278 Other specified disorders of adrenal gland: Secondary | ICD-10-CM | POA: Diagnosis present

## 2017-03-25 DIAGNOSIS — K219 Gastro-esophageal reflux disease without esophagitis: Secondary | ICD-10-CM | POA: Diagnosis present

## 2017-03-25 DIAGNOSIS — F1410 Cocaine abuse, uncomplicated: Secondary | ICD-10-CM | POA: Diagnosis present

## 2017-03-25 DIAGNOSIS — S22009A Unspecified fracture of unspecified thoracic vertebra, initial encounter for closed fracture: Secondary | ICD-10-CM | POA: Diagnosis present

## 2017-03-25 DIAGNOSIS — L0231 Cutaneous abscess of buttock: Secondary | ICD-10-CM | POA: Diagnosis present

## 2017-03-25 DIAGNOSIS — A419 Sepsis, unspecified organism: Secondary | ICD-10-CM | POA: Diagnosis present

## 2017-03-25 DIAGNOSIS — N390 Urinary tract infection, site not specified: Secondary | ICD-10-CM

## 2017-03-25 HISTORY — DX: Cutaneous abscess, unspecified: L02.91

## 2017-03-25 NOTE — ED Triage Notes (Signed)
C/o large abscess to L buttocks x 2 weeks.  Denies fever/chills.

## 2017-03-26 ENCOUNTER — Emergency Department (HOSPITAL_COMMUNITY): Payer: Medicare Other

## 2017-03-26 ENCOUNTER — Encounter (HOSPITAL_COMMUNITY): Admission: EM | Disposition: A | Discharge status: Home / Self Care | Payer: Self-pay | Source: Home / Self Care

## 2017-03-26 ENCOUNTER — Observation Stay (HOSPITAL_COMMUNITY): Payer: Medicare Other | Admitting: Anesthesiology

## 2017-03-26 ENCOUNTER — Encounter (HOSPITAL_COMMUNITY): Payer: Self-pay | Admitting: Radiology

## 2017-03-26 DIAGNOSIS — E119 Type 2 diabetes mellitus without complications: Secondary | ICD-10-CM

## 2017-03-26 DIAGNOSIS — L0231 Cutaneous abscess of buttock: Secondary | ICD-10-CM | POA: Diagnosis present

## 2017-03-26 DIAGNOSIS — E871 Hypo-osmolality and hyponatremia: Secondary | ICD-10-CM | POA: Diagnosis not present

## 2017-03-26 DIAGNOSIS — S22009A Unspecified fracture of unspecified thoracic vertebra, initial encounter for closed fracture: Secondary | ICD-10-CM | POA: Diagnosis present

## 2017-03-26 DIAGNOSIS — N179 Acute kidney failure, unspecified: Secondary | ICD-10-CM

## 2017-03-26 DIAGNOSIS — E279 Disorder of adrenal gland, unspecified: Secondary | ICD-10-CM

## 2017-03-26 DIAGNOSIS — M48061 Spinal stenosis, lumbar region without neurogenic claudication: Secondary | ICD-10-CM | POA: Diagnosis present

## 2017-03-26 DIAGNOSIS — E278 Other specified disorders of adrenal gland: Secondary | ICD-10-CM | POA: Diagnosis present

## 2017-03-26 DIAGNOSIS — N390 Urinary tract infection, site not specified: Secondary | ICD-10-CM | POA: Diagnosis not present

## 2017-03-26 DIAGNOSIS — A419 Sepsis, unspecified organism: Secondary | ICD-10-CM

## 2017-03-26 DIAGNOSIS — E1169 Type 2 diabetes mellitus with other specified complication: Secondary | ICD-10-CM

## 2017-03-26 HISTORY — PX: INCISION AND DRAINAGE PERIRECTAL ABSCESS: SHX1804

## 2017-03-26 LAB — CBC WITH DIFFERENTIAL/PLATELET
BASOS ABS: 0 10*3/uL (ref 0.0–0.1)
Basophils Relative: 0 %
Eosinophils Absolute: 0 10*3/uL (ref 0.0–0.7)
Eosinophils Relative: 0 %
HEMATOCRIT: 45.5 % (ref 39.0–52.0)
HEMOGLOBIN: 14.7 g/dL (ref 13.0–17.0)
LYMPHS PCT: 14 %
Lymphs Abs: 1.6 10*3/uL (ref 0.7–4.0)
MCH: 28.4 pg (ref 26.0–34.0)
MCHC: 32.3 g/dL (ref 30.0–36.0)
MCV: 87.8 fL (ref 78.0–100.0)
Monocytes Absolute: 0.9 10*3/uL (ref 0.1–1.0)
Monocytes Relative: 8 %
NEUTROS ABS: 9 10*3/uL — AB (ref 1.7–7.7)
NEUTROS PCT: 78 %
Platelets: 303 10*3/uL (ref 150–400)
RBC: 5.18 MIL/uL (ref 4.22–5.81)
RDW: 13.3 % (ref 11.5–15.5)
WBC: 11.5 10*3/uL — AB (ref 4.0–10.5)

## 2017-03-26 LAB — COMPREHENSIVE METABOLIC PANEL
ALT: 14 U/L — ABNORMAL LOW (ref 17–63)
ANION GAP: 10 (ref 5–15)
AST: 21 U/L (ref 15–41)
Albumin: 3.6 g/dL (ref 3.5–5.0)
Alkaline Phosphatase: 91 U/L (ref 38–126)
BUN: 18 mg/dL (ref 6–20)
CHLORIDE: 100 mmol/L — AB (ref 101–111)
CO2: 21 mmol/L — AB (ref 22–32)
CREATININE: 1.46 mg/dL — AB (ref 0.61–1.24)
Calcium: 9.3 mg/dL (ref 8.9–10.3)
GFR calc non Af Amer: 48 mL/min — ABNORMAL LOW (ref >60)
GFR, EST AFRICAN AMERICAN: 55 mL/min — AB (ref >60)
Glucose, Bld: 245 mg/dL — ABNORMAL HIGH (ref 65–99)
Potassium: 3.9 mmol/L (ref 3.5–5.1)
SODIUM: 131 mmol/L — AB (ref 135–145)
Total Bilirubin: 0.9 mg/dL (ref 0.3–1.2)
Total Protein: 7.3 g/dL (ref 6.5–8.1)

## 2017-03-26 LAB — URINALYSIS, ROUTINE W REFLEX MICROSCOPIC
Bacteria, UA: NONE SEEN
Bilirubin Urine: NEGATIVE
GLUCOSE, UA: NEGATIVE mg/dL
KETONES UR: NEGATIVE mg/dL
NITRITE: NEGATIVE
PH: 5 (ref 5.0–8.0)
PROTEIN: NEGATIVE mg/dL
Specific Gravity, Urine: 1.014 (ref 1.005–1.030)

## 2017-03-26 LAB — I-STAT CG4 LACTIC ACID, ED
LACTIC ACID, VENOUS: 1.21 mmol/L (ref 0.5–1.9)
Lactic Acid, Venous: 3.3 mmol/L (ref 0.5–1.9)

## 2017-03-26 LAB — PROTIME-INR
INR: 1.32
Prothrombin Time: 16.5 seconds — ABNORMAL HIGH (ref 11.4–15.2)

## 2017-03-26 LAB — GLUCOSE, CAPILLARY
GLUCOSE-CAPILLARY: 130 mg/dL — AB (ref 65–99)
Glucose-Capillary: 112 mg/dL — ABNORMAL HIGH (ref 65–99)
Glucose-Capillary: 120 mg/dL — ABNORMAL HIGH (ref 65–99)
Glucose-Capillary: 186 mg/dL — ABNORMAL HIGH (ref 65–99)
Glucose-Capillary: 229 mg/dL — ABNORMAL HIGH (ref 65–99)

## 2017-03-26 LAB — SURGICAL PCR SCREEN
MRSA, PCR: NEGATIVE
STAPHYLOCOCCUS AUREUS: NEGATIVE

## 2017-03-26 SURGERY — INCISION AND DRAINAGE, ABSCESS, PERIRECTAL
Anesthesia: General | Site: Buttocks

## 2017-03-26 MED ORDER — MORPHINE SULFATE (PF) 4 MG/ML IV SOLN
4.0000 mg | Freq: Once | INTRAVENOUS | Status: AC
  Administered 2017-03-26: 4 mg via INTRAVENOUS
  Filled 2017-03-26: qty 1

## 2017-03-26 MED ORDER — DIPHENHYDRAMINE HCL 12.5 MG/5ML PO ELIX: 12.5000 mg | ORAL_SOLUTION | Freq: Four times a day (QID) | ORAL | Status: DC | PRN

## 2017-03-26 MED ORDER — ACETAMINOPHEN 325 MG PO TABS
650.0000 mg | ORAL_TABLET | Freq: Four times a day (QID) | ORAL | Status: DC | PRN
  Filled 2017-03-26: qty 2

## 2017-03-26 MED ORDER — VANCOMYCIN HCL IN DEXTROSE 1-5 GM/200ML-% IV SOLN
1000.0000 mg | Freq: Once | INTRAVENOUS | Status: AC
  Administered 2017-03-26: 1000 mg via INTRAVENOUS
  Filled 2017-03-26: qty 200

## 2017-03-26 MED ORDER — ACETAMINOPHEN 325 MG PO TABS
ORAL_TABLET | ORAL | Status: AC
  Administered 2017-03-26: 650 mg via ORAL
  Filled 2017-03-26: qty 2

## 2017-03-26 MED ORDER — DOXAZOSIN MESYLATE 4 MG PO TABS
4.0000 mg | ORAL_TABLET | Freq: Every day | ORAL | Status: DC
  Administered 2017-03-26 – 2017-03-27 (×2): 4 mg via ORAL
  Filled 2017-03-26 (×3): qty 1

## 2017-03-26 MED ORDER — SODIUM CHLORIDE 0.9 % IV SOLN
INTRAVENOUS | Status: DC
  Administered 2017-03-26: 08:00:00 via INTRAVENOUS

## 2017-03-26 MED ORDER — MUPIROCIN 2 % EX OINT
TOPICAL_OINTMENT | CUTANEOUS | Status: AC
  Administered 2017-03-26: 10:00:00
  Filled 2017-03-26: qty 22

## 2017-03-26 MED ORDER — MORPHINE SULFATE (PF) 2 MG/ML IV SOLN: 1.0000 mg | INTRAVENOUS | Status: DC | PRN

## 2017-03-26 MED ORDER — SODIUM CHLORIDE 0.9 % IV SOLN: INTRAVENOUS | Status: DC

## 2017-03-26 MED ORDER — LORAZEPAM 2 MG/ML IJ SOLN
0.5000 mg | Freq: Once | INTRAMUSCULAR | Status: AC
  Administered 2017-03-26: 0.5 mg via INTRAVENOUS
  Filled 2017-03-26: qty 1

## 2017-03-26 MED ORDER — ACETAMINOPHEN 650 MG RE SUPP: 650.0000 mg | Freq: Four times a day (QID) | RECTAL | Status: DC | PRN

## 2017-03-26 MED ORDER — ENOXAPARIN SODIUM 40 MG/0.4ML ~~LOC~~ SOLN: 40.0000 mg | SUBCUTANEOUS | Status: DC

## 2017-03-26 MED ORDER — INSULIN ASPART 100 UNIT/ML ~~LOC~~ SOLN: 0.0000 [IU] | SUBCUTANEOUS | Status: DC

## 2017-03-26 MED ORDER — FENTANYL CITRATE (PF) 250 MCG/5ML IJ SOLN
INTRAMUSCULAR | Status: DC | PRN
  Administered 2017-03-26: 100 ug via INTRAVENOUS

## 2017-03-26 MED ORDER — MORPHINE SULFATE (PF) 4 MG/ML IV SOLN: 1.0000 mg | INTRAVENOUS | Status: DC | PRN

## 2017-03-26 MED ORDER — INSULIN ASPART 100 UNIT/ML ~~LOC~~ SOLN
0.0000 [IU] | SUBCUTANEOUS | Status: DC
  Administered 2017-03-26: 5 [IU] via SUBCUTANEOUS
  Administered 2017-03-27: 2 [IU] via SUBCUTANEOUS
  Administered 2017-03-27: 3 [IU] via SUBCUTANEOUS

## 2017-03-26 MED ORDER — ACETAMINOPHEN 325 MG PO TABS
650.0000 mg | ORAL_TABLET | Freq: Once | ORAL | Status: AC | PRN
  Administered 2017-03-26: 650 mg via ORAL

## 2017-03-26 MED ORDER — ONDANSETRON HCL 4 MG/2ML IJ SOLN
INTRAMUSCULAR | Status: DC | PRN
  Administered 2017-03-26: 4 mg via INTRAVENOUS

## 2017-03-26 MED ORDER — IOPAMIDOL (ISOVUE-300) INJECTION 61%
INTRAVENOUS | Status: AC
  Administered 2017-03-26: 100 mL
  Filled 2017-03-26: qty 100

## 2017-03-26 MED ORDER — VANCOMYCIN HCL IN DEXTROSE 1-5 GM/200ML-% IV SOLN
1000.0000 mg | Freq: Two times a day (BID) | INTRAVENOUS | Status: DC
  Administered 2017-03-26: 1000 mg via INTRAVENOUS
  Filled 2017-03-26 (×3): qty 200

## 2017-03-26 MED ORDER — SUGAMMADEX SODIUM 200 MG/2ML IV SOLN
INTRAVENOUS | Status: AC
  Filled 2017-03-26: qty 2

## 2017-03-26 MED ORDER — 0.9 % SODIUM CHLORIDE (POUR BTL) OPTIME
TOPICAL | Status: DC | PRN
  Administered 2017-03-26: 1000 mL

## 2017-03-26 MED ORDER — SIMETHICONE 80 MG PO CHEW: 40.0000 mg | CHEWABLE_TABLET | Freq: Four times a day (QID) | ORAL | Status: DC | PRN

## 2017-03-26 MED ORDER — PIPERACILLIN-TAZOBACTAM 3.375 G IVPB 30 MIN
3.3750 g | INTRAVENOUS | Status: DC
  Filled 2017-03-26: qty 50

## 2017-03-26 MED ORDER — PROPOFOL 10 MG/ML IV BOLUS
INTRAVENOUS | Status: DC | PRN
  Administered 2017-03-26: 150 mg via INTRAVENOUS
  Administered 2017-03-26: 50 mg via INTRAVENOUS

## 2017-03-26 MED ORDER — PIPERACILLIN-TAZOBACTAM 3.375 G IVPB
3.3750 g | Freq: Three times a day (TID) | INTRAVENOUS | Status: DC
  Administered 2017-03-26 – 2017-03-27 (×2): 3.375 g via INTRAVENOUS
  Filled 2017-03-26 (×3): qty 50

## 2017-03-26 MED ORDER — ONDANSETRON HCL 4 MG/2ML IJ SOLN
INTRAMUSCULAR | Status: AC
  Filled 2017-03-26: qty 2

## 2017-03-26 MED ORDER — HYDRALAZINE HCL 20 MG/ML IJ SOLN: 10.0000 mg | INTRAMUSCULAR | Status: DC | PRN

## 2017-03-26 MED ORDER — SENNA 8.6 MG PO TABS
1.0000 | ORAL_TABLET | Freq: Two times a day (BID) | ORAL | Status: DC
  Administered 2017-03-26 – 2017-03-27 (×3): 8.6 mg via ORAL
  Filled 2017-03-26 (×3): qty 1

## 2017-03-26 MED ORDER — POLYETHYLENE GLYCOL 3350 17 G PO PACK
17.0000 g | PACK | Freq: Every day | ORAL | Status: DC
Start: 2017-03-26 — End: 2017-03-27

## 2017-03-26 MED ORDER — ACETAMINOPHEN 325 MG PO TABS: 650.0000 mg | ORAL_TABLET | Freq: Four times a day (QID) | ORAL | Status: DC | PRN

## 2017-03-26 MED ORDER — SODIUM CHLORIDE 0.9 % IV SOLN
INTRAVENOUS | Status: AC
  Administered 2017-03-26: 14:00:00 via INTRAVENOUS

## 2017-03-26 MED ORDER — LORAZEPAM 2 MG/ML IJ SOLN
0.5000 mg | Freq: Four times a day (QID) | INTRAMUSCULAR | Status: DC | PRN
  Administered 2017-03-26 – 2017-03-27 (×2): 0.5 mg via INTRAVENOUS
  Filled 2017-03-26 (×2): qty 1

## 2017-03-26 MED ORDER — ROCURONIUM BROMIDE 10 MG/ML (PF) SYRINGE
PREFILLED_SYRINGE | INTRAVENOUS | Status: DC | PRN
  Administered 2017-03-26: 30 mg via INTRAVENOUS

## 2017-03-26 MED ORDER — ENOXAPARIN SODIUM 40 MG/0.4ML ~~LOC~~ SOLN
40.0000 mg | SUBCUTANEOUS | Status: DC
  Administered 2017-03-27 – 2017-03-28 (×2): 40 mg via SUBCUTANEOUS
  Filled 2017-03-26 (×2): qty 0.4

## 2017-03-26 MED ORDER — ONDANSETRON HCL 4 MG/2ML IJ SOLN: 4.0000 mg | Freq: Four times a day (QID) | INTRAMUSCULAR | Status: DC | PRN

## 2017-03-26 MED ORDER — DIPHENHYDRAMINE HCL 50 MG/ML IJ SOLN: 12.5000 mg | Freq: Four times a day (QID) | INTRAMUSCULAR | Status: DC | PRN

## 2017-03-26 MED ORDER — OXYCODONE-ACETAMINOPHEN 5-325 MG PO TABS
1.0000 | ORAL_TABLET | ORAL | Status: DC | PRN
  Administered 2017-03-26: 1 via ORAL
  Administered 2017-03-27: 2 via ORAL
  Filled 2017-03-26: qty 2
  Filled 2017-03-26: qty 1

## 2017-03-26 MED ORDER — MIDAZOLAM HCL 2 MG/2ML IJ SOLN
INTRAMUSCULAR | Status: DC | PRN
  Administered 2017-03-26: 1 mg via INTRAVENOUS

## 2017-03-26 MED ORDER — SODIUM CHLORIDE 0.9 % IV BOLUS (SEPSIS)
1000.0000 mL | Freq: Once | INTRAVENOUS | Status: AC
  Administered 2017-03-26: 1000 mL via INTRAVENOUS

## 2017-03-26 MED ORDER — HYDROMORPHONE HCL 1 MG/ML IJ SOLN: 0.2500 mg | INTRAMUSCULAR | Status: DC | PRN

## 2017-03-26 MED ORDER — ONDANSETRON 4 MG PO TBDP: 4.0000 mg | ORAL_TABLET | Freq: Four times a day (QID) | ORAL | Status: DC | PRN

## 2017-03-26 MED ORDER — TRIAMTERENE-HCTZ 75-50 MG PO TABS
1.0000 | ORAL_TABLET | Freq: Every day | ORAL | Status: DC
  Administered 2017-03-26 – 2017-03-28 (×3): 1 via ORAL
  Filled 2017-03-26 (×3): qty 1

## 2017-03-26 MED ORDER — SODIUM CHLORIDE 0.9 % IV BOLUS (SEPSIS)
1000.0000 mL | Freq: Once | INTRAVENOUS | Status: AC
Start: 2017-03-26 — End: 2017-03-26
  Administered 2017-03-26: 1000 mL via INTRAVENOUS

## 2017-03-26 MED ORDER — FENTANYL CITRATE (PF) 250 MCG/5ML IJ SOLN
INTRAMUSCULAR | Status: AC
  Filled 2017-03-26: qty 5

## 2017-03-26 MED ORDER — MIDAZOLAM HCL 2 MG/2ML IJ SOLN
INTRAMUSCULAR | Status: AC
  Filled 2017-03-26: qty 2

## 2017-03-26 MED ORDER — SUGAMMADEX SODIUM 200 MG/2ML IV SOLN
INTRAVENOUS | Status: DC | PRN
  Administered 2017-03-26: 200 mg via INTRAVENOUS

## 2017-03-26 MED ORDER — LIDOCAINE 2% (20 MG/ML) 5 ML SYRINGE
INTRAMUSCULAR | Status: AC
  Filled 2017-03-26: qty 5

## 2017-03-26 MED ORDER — PROPOFOL 10 MG/ML IV BOLUS
INTRAVENOUS | Status: AC
  Filled 2017-03-26: qty 20

## 2017-03-26 MED ORDER — POLYETHYLENE GLYCOL 3350 17 G PO PACK: 17.0000 g | PACK | Freq: Every day | ORAL | Status: DC | PRN

## 2017-03-26 MED ORDER — PIPERACILLIN-TAZOBACTAM 3.375 G IVPB 30 MIN
3.3750 g | Freq: Once | INTRAVENOUS | Status: AC
  Administered 2017-03-26: 3.375 g via INTRAVENOUS
  Filled 2017-03-26: qty 50

## 2017-03-26 SURGICAL SUPPLY — 30 items
CANISTER SUCT 3000ML PPV (MISCELLANEOUS) ×3 IMPLANT
COVER SURGICAL LIGHT HANDLE (MISCELLANEOUS) ×3 IMPLANT
DRSG PAD ABDOMINAL 8X10 ST (GAUZE/BANDAGES/DRESSINGS) ×3 IMPLANT
ELECT CAUTERY BLADE 6.4 (BLADE) IMPLANT
ELECT REM PT RETURN 9FT ADLT (ELECTROSURGICAL)
ELECTRODE REM PT RTRN 9FT ADLT (ELECTROSURGICAL) IMPLANT
GAUZE IODOFORM PACK 1/2 7832 (GAUZE/BANDAGES/DRESSINGS) ×2 IMPLANT
GAUZE PACKING IODOFORM 1 (PACKING) IMPLANT
GAUZE SPONGE 4X4 12PLY STRL (GAUZE/BANDAGES/DRESSINGS) ×3 IMPLANT
GLOVE BIO SURGEON STRL SZ7 (GLOVE) ×3 IMPLANT
GLOVE BIOGEL PI IND STRL 7.5 (GLOVE) ×1 IMPLANT
GLOVE BIOGEL PI INDICATOR 7.5 (GLOVE) ×2
GOWN STRL REUS W/ TWL LRG LVL3 (GOWN DISPOSABLE) ×2 IMPLANT
GOWN STRL REUS W/TWL LRG LVL3 (GOWN DISPOSABLE) ×6
KIT BASIN OR (CUSTOM PROCEDURE TRAY) ×3 IMPLANT
KIT ROOM TURNOVER OR (KITS) ×3 IMPLANT
NS IRRIG 1000ML POUR BTL (IV SOLUTION) ×3 IMPLANT
PACK LITHOTOMY IV (CUSTOM PROCEDURE TRAY) ×1 IMPLANT
PACK SURGICAL SETUP 50X90 (CUSTOM PROCEDURE TRAY) ×2 IMPLANT
PAD ARMBOARD 7.5X6 YLW CONV (MISCELLANEOUS) ×3 IMPLANT
PENCIL BUTTON HOLSTER BLD 10FT (ELECTRODE) ×3 IMPLANT
SPONGE LAP 18X18 X RAY DECT (DISPOSABLE) ×3 IMPLANT
SWAB COLLECTION DEVICE MRSA (MISCELLANEOUS) ×2 IMPLANT
SWAB CULTURE ESWAB REG 1ML (MISCELLANEOUS) ×2 IMPLANT
TOWEL OR 17X24 6PK STRL BLUE (TOWEL DISPOSABLE) ×3 IMPLANT
TOWEL OR 17X26 10 PK STRL BLUE (TOWEL DISPOSABLE) ×3 IMPLANT
TUBE CONNECTING 12'X1/4 (SUCTIONS) ×1
TUBE CONNECTING 12X1/4 (SUCTIONS) ×2 IMPLANT
UNDERPAD 30X30 (UNDERPADS AND DIAPERS) ×3 IMPLANT
YANKAUER SUCT BULB TIP NO VENT (SUCTIONS) ×3 IMPLANT

## 2017-03-26 NOTE — H&P (Signed)
[facility] Surgery Admission Note  DEADRIAN Bradley November 24, 1950  [id number].    Requesting MD: Roxanne Mins Chief Complaint/Reason for Consult: Left gluteal abscess  HPI:  Tim Bradley is a 66yo male PMH DM who presented to [facility] late last night with 2 weeks of progressive left gluteal pain. States that he can feel a nodule in this area. States that the pain is constant and severe. Worse with sitting. He denies any drainage from this area. States that he has never had anything like this in the past. Denies fever, chills, nausea, vomiting, dysuria. Patient is diabetic and states that he does not check in blood glucose regularly.   Last meal last night Last [facility] yesterday  Hospital workup: - CT scan shows left gluteal fold abscess measuring at least 3.2 cm with surrounding inflammatory changes of left buttocks subcutaneous fat - WBC 11.5, lactic acid 3.3 (now 1.21), TMAX 101.1 - started on vancomycin and zosyn  PMH significant for DM, BPH, Tobacco abuse, Illicit drug use Anticoagulants: none Smokes about 1/3 PPD Lives at home alone  ROS: Review of Systems  Constitutional: Positive for chills and fever.  HENT: Negative.   Eyes: Negative.   Respiratory: Negative.   Cardiovascular: Negative.   Gastrointestinal: Negative.   Genitourinary: Negative for dysuria.       Left gluteal pain  Musculoskeletal: Negative.   Skin: Negative.   Neurological: Negative.   All systems reviewed and otherwise negative except for as above  No family history on file.  Past Medical History:  Diagnosis Date  . Acid reflux   . DDD (degenerative disc disease), lumbar   . Diabetes mellitus without complication ([place])   . Enlarged prostate   . Hypertension   . Spinal stenosis, lumbar     Past Surgical History:  Procedure Laterality Date  . BACK SURGERY     cervical    Social History:  reports that he has been smoking.  He has been smoking about 0.50 packs per day. He has never used smokeless  tobacco. He reports that he drinks about 7.2 oz of alcohol per week . He reports that he uses drugs, including Cocaine.  Allergies: No Known Allergies   (Not in a hospital admission)  Prior to Admission medications   Medication Sig Start Date End Date Taking? Authorizing Provider  clonazePAM (KLONOPIN) 1 MG tablet Take 1 mg by mouth 3 (three) times daily. 03/19/17  Yes [provider]  doxazosin (CARDURA) 8 MG tablet Take 4 mg by mouth at bedtime. Takes 1/2 tablet   Yes [provider]  metFORMIN (GLUCOPHAGE) 500 MG tablet Take 500 mg by mouth 2 (two) times daily with a meal.   Yes [provider]  ranitidine (ZANTAC) 150 MG tablet Take 150 mg by mouth 2 (two) times daily.   Yes [provider]  traMADol (ULTRAM) 50 MG tablet Take 50 mg by mouth every 6 (six) hours as needed. Pain   Yes [provider]  triamterene-hydrochlorothiazide (MAXZIDE) 75-50 MG per tablet Take 1 tablet by mouth daily.   Yes [provider]  HYDROcodone-acetaminophen (NORCO/VICODIN) 5-325 MG tablet Take 1-2 tablets by mouth every 6 (six) hours as needed for moderate pain. Patient not taking: Reported on 03/26/2017 02/25/17   Fredia Sorrow, MD  methocarbamol (ROBAXIN) 500 MG tablet Take 1 tablet (500 mg total) by mouth 2 (two) times daily. Patient not taking: Reported on 03/26/2017 08/03/14   Carlisle Cater, PA-C  oxyCODONE-acetaminophen (PERCOCET/ROXICET) 5-325 MG per tablet Take 1-2  tablets by mouth every 6 (six) hours as needed for severe pain. Patient not taking: Reported on 03/26/2017 08/03/14   Carlisle Cater, PA-C    Blood pressure 122/75, pulse 79, temperature 99.6 F (37.6 C), temperature source Oral, resp. rate 20, height 6' 2"  (1.88 m), weight 209 lb 12.8 oz (95.2 kg), SpO2 91 %. Physical Exam: General: pleasant, WD/WN AA male who is laying in bed in NAD HEENT: head is normocephalic, atraumatic.  Sclera are noninjected.  Pupils equal and round.  Ears and  nose without any masses or lesions.  Mouth is pink and moist. Dentition fair Heart: regular, rate, and rhythm.  No obvious murmurs, gallops, or rubs noted.  Palpable pedal pulses bilaterally Lungs: CTAB, no wheezes, rhonchi, or rales noted.  Respiratory effort nonlabored Abd: soft, NT/ND, +BS, no masses, hernias, or organomegaly MS: all 4 extremities are symmetrical with no cyanosis, clubbing, or edema. Skin: warm and dry with no masses, lesions, or rashes Psych: A&Ox3 with an appropriate affect. Neuro: cranial nerves grossly intact, extremity CSM intact bilaterally, normal speech GU: large left gluteal abscess with induration and tenderness, no active drainage  Results for orders placed or performed during the hospital encounter of 03/25/17 (from the past 48 hour(s))  Comprehensive metabolic panel     Status: Abnormal   Collection Time: 03/26/17 12:54 AM  Result Value Ref Range   Sodium 131 (L) 135 - 145 mmol/L   Potassium 3.9 3.5 - 5.1 mmol/L   Chloride 100 (L) 101 - 111 mmol/L   CO2 21 (L) 22 - 32 mmol/L   Glucose, Bld 245 (H) 65 - 99 mg/dL   BUN 18 6 - 20 mg/dL   Creatinine, Ser 1.46 (H) 0.61 - 1.24 mg/dL   Calcium 9.3 8.9 - 10.3 mg/dL   Total Protein 7.3 6.5 - 8.1 g/dL   Albumin 3.6 3.5 - 5.0 g/dL   AST 21 15 - 41 U/L   ALT 14 (L) 17 - 63 U/L   Alkaline Phosphatase 91 38 - 126 U/L   Total Bilirubin 0.9 0.3 - 1.2 mg/dL   GFR calc non Af Amer 48 (L) >60 mL/min   GFR calc Af Amer 55 (L) >60 mL/min    Comment: (NOTE) The eGFR has been calculated using the CKD EPI equation. This calculation has not been validated in all clinical situations. eGFR's persistently <60 mL/min signify possible Chronic Kidney Disease.    Anion gap 10 5 - 15  CBC with Differential     Status: Abnormal   Collection Time: 03/26/17 12:54 AM  Result Value Ref Range   WBC 11.5 (H) 4.0 - 10.5 K/uL   RBC 5.18 4.22 - 5.81 MIL/uL   Hemoglobin 14.7 13.0 - 17.0 g/dL   HCT 45.5 39.0 - 52.0 %   MCV 87.8 78.0 -  100.0 fL   MCH 28.4 26.0 - 34.0 pg   MCHC 32.3 30.0 - 36.0 g/dL   RDW 13.3 11.5 - 15.5 %   Platelets 303 150 - 400 K/uL   Neutrophils Relative % 78 %   Neutro Abs 9.0 (H) 1.7 - 7.7 K/uL   Lymphocytes Relative 14 %   Lymphs Abs 1.6 0.7 - 4.0 K/uL   Monocytes Relative 8 %   Monocytes Absolute 0.9 0.1 - 1.0 K/uL   Eosinophils Relative 0 %   Eosinophils Absolute 0.0 0.0 - 0.7 K/uL   Basophils Relative 0 %   Basophils Absolute 0.0 0.0 - 0.1 K/uL  Protime-INR  Status: Abnormal   Collection Time: 03/26/17 12:54 AM  Result Value Ref Range   Prothrombin Time 16.5 (H) 11.4 - 15.2 seconds   INR 1.32   I-Stat CG4 Lactic Acid, ED     Status: Abnormal   Collection Time: 03/26/17  1:20 AM  Result Value Ref Range   Lactic Acid, Venous 3.30 (HH) 0.5 - 1.9 mmol/L   Comment NOTIFIED PHYSICIAN   Urinalysis, Routine w reflex microscopic     Status: Abnormal   Collection Time: 03/26/17  1:23 AM  Result Value Ref Range   Color, Urine YELLOW YELLOW   APPearance CLEAR CLEAR   Specific Gravity, Urine 1.014 1.005 - 1.030   pH 5.0 5.0 - 8.0   Glucose, UA NEGATIVE NEGATIVE mg/dL   Hgb urine dipstick MODERATE (A) NEGATIVE   Bilirubin Urine NEGATIVE NEGATIVE   Ketones, ur NEGATIVE NEGATIVE mg/dL   Protein, ur NEGATIVE NEGATIVE mg/dL   Nitrite NEGATIVE NEGATIVE   Leukocytes, UA SMALL (A) NEGATIVE   RBC / HPF 6-30 0 - 5 RBC/hpf   WBC, UA TOO NUMEROUS TO COUNT 0 - 5 WBC/hpf   Bacteria, UA NONE SEEN NONE SEEN   Squamous Epithelial / LPF 0-5 (A) NONE SEEN   Mucous PRESENT   I-Stat CG4 Lactic Acid, ED     Status: None   Collection Time: 03/26/17  4:00 AM  Result Value Ref Range   Lactic Acid, Venous 1.21 0.5 - 1.9 mmol/L   Dg Chest 2 View  Result Date: 03/26/2017 CLINICAL DATA:  Fever abdominal pain. EXAM: CHEST  2 VIEW COMPARISON:  None. FINDINGS: The lungs are clear. The pulmonary vasculature is normal. Heart size is normal. Hilar and mediastinal contours are unremarkable. There is no pleural  effusion. IMPRESSION: No active cardiopulmonary disease. Electronically Signed   By: Andreas Newport M.D.   On: 03/26/2017 01:10   Ct Abdomen Pelvis W Contrast  Result Date: 03/26/2017 CLINICAL DATA:  66 y/o  M; abdominal bloating, left gluteal abscess. EXAM: CT ABDOMEN AND PELVIS WITH CONTRAST TECHNIQUE: Multidetector CT imaging of the abdomen and pelvis was performed using the standard protocol following bolus administration of intravenous contrast. CONTRAST:  161m ISOVUE-300 IOPAMIDOL (ISOVUE-300) INJECTION 61% COMPARISON:  11/20/2011 lumbar spine MRI. FINDINGS: Lower chest: No acute abnormality. Hepatobiliary: No focal liver abnormality is seen. No gallstones, gallbladder wall thickening, or biliary dilatation. Pancreas: Unremarkable. No pancreatic ductal dilatation or surrounding inflammatory changes. Spleen: Normal in size without focal abnormality. Adrenals/Urinary Tract: 13 mm right and 19 mm left adrenal nodule (series 3, image 24 and series 6, image 96). No focal kidney lesion is identified. No urinary stone disease or obstructive uropathy. Normal bladder. Stomach/Bowel: Stomach is within normal limits. Appendix appears normal. No evidence of bowel wall thickening, distention, or inflammatory changes. Moderate volume of stool throughout the colon. Vascular/Lymphatic: Aortic atherosclerosis. No enlarged abdominal or pelvic lymph nodes. Reproductive: Moderate enlargement of prostate. Other: Small paraumbilical hernia containing fat. Musculoskeletal: Partially visualize left gluteal fold abscess measuring at least 3.2 cm (series 3, image 96). Surrounding inflammatory changes in subcutaneous fat of the left buttocks. Superior endplate fracture of TJ24vertebral body with mild 30-40% loss of height appears recent. Advanced lumbar spine degenerative changes greatest at the L4-5 and L5-S1 levels. Multifactorial moderate to severe L2-3 through L5-S1 canal stenosis. Stable mild anterior chronic compression  deformity of L2. IMPRESSION: 1. Bilateral indeterminate adrenal nodules measuring up to 19 mm. In the absence of primary malignancy these are probably benign, consider 12 month follow-up  with adrenal CT. 2. Moderate volume of stool throughout the colon, question constipation. 3. Moderate prostate enlargement. 4. Partially visualized left gluteal fold abscess measuring at least 3.2 cm with surrounding inflammatory changes of left buttocks subcutaneous fat. 5. T12 vertebral body superior endplate fracture with mild 30-40% loss of height appears recent, correlate for focal pain. 6. Advanced degenerative changes of the lumbar spine with multiple levels of moderate to severe canal stenosis. Electronically Signed   By: Kristine Garbe M.D.   On: 03/26/2017 04:14   Anti-infectives    Start     Dose/Rate Route Frequency Ordered Stop   03/26/17 1100  vancomycin (VANCOCIN) IVPB 1000 mg/200 mL premix     1,000 mg 200 mL/hr over 60 Minutes Intravenous Every 12 hours 03/26/17 0304     03/26/17 0900  piperacillin-tazobactam (ZOSYN) IVPB 3.375 g     3.375 g 12.5 mL/hr over 240 Minutes Intravenous Every 8 hours 03/26/17 0248     03/26/17 0215  piperacillin-tazobactam (ZOSYN) IVPB 3.375 g     3.375 g 100 mL/hr over 30 Minutes Intravenous  Once 03/26/17 0212 03/26/17 1308   03/26/17 0215  vancomycin (VANCOCIN) IVPB 1000 mg/200 mL premix     1,000 mg 200 mL/hr over 60 Minutes Intravenous  Once 03/26/17 6578 03/26/17 4696        Assessment/Plan Left gluteal abscess - 2 weeks of progressive left gluteal tenderness - CT scan shows left gluteal fold abscess measuring at least 3.2 cm with surrounding inflammatory changes of left buttocks subcutaneous fat - WBC 11.5, lactic acid 3.3 (now 1.21), TMAX 101.1  DM - SSI BPH Tobacco abuse Illicit drug use - last use of cocaine 2-3 days ago AKI - Cr 1.46, continue IVF Hyponatremia - IVF Constipation - start daily miralax postoperatively  ID -  zosyn/vancomycin 6/28>> VTE - SCDs FEN - IVF, NPO  Plan - Agree with IV antibiotics. Plan for OR today for I&D of left gluteal abscess. Admit to med-surg. NPO, IVF, pain control. Blood cultures pending.  Jerrye Beavers, [facility] Surgery 03/26/2017, 7:03 AM Pager: [phone] Consults: [phone] Mon-Fri 7:00 am-4:30 pm Sat-Sun 7:00 am-11:30 am

## 2017-03-26 NOTE — Anesthesia Postprocedure Evaluation (Signed)
Anesthesia Post Note  Patient: ORLANDER NORWOOD  Procedure(s) Performed: Procedure(s) (LRB): IRRIGATION AND DEBRIDEMENT PERIRECTAL ABSCESS (N/A)     Patient location during evaluation: PACU Anesthesia Type: General Level of consciousness: awake and alert Pain management: pain level controlled Vital Signs Assessment: post-procedure vital signs reviewed and stable Respiratory status: spontaneous breathing, nonlabored ventilation, respiratory function stable and patient connected to nasal cannula oxygen Cardiovascular status: blood pressure returned to baseline and stable Postop Assessment: no signs of nausea or vomiting Anesthetic complications: no    Last Vitals:  Vitals:   03/26/17 1210 03/26/17 1225  BP: 100/64 (!) 100/54  Pulse: 91 90  Resp: (!) 23 (!) 24  Temp: 37.3 C     Last Pain:  Vitals:   03/26/17 1210  TempSrc:   PainSc: Asleep                 Keerat Denicola,W. EDMOND

## 2017-03-26 NOTE — ED Provider Notes (Addendum)
[facility] DEPT Provider Note   CSN: [id number] Arrival date & time: 03/25/17  2009  By signing my name below, I, Tim Bradley, attest that this documentation has been prepared under the direction and in the presence of Delora Fuel, MD. Electronically Signed: Collene Bradley, Scribe. 03/26/17. 2:13 AM.  History   Chief Complaint Chief Complaint  Patient presents with  . Abscess   HPI Comments: Tim Bradley is a 66 y.o. male with a history of DM, who presents to the Emergency Department complaining of a gradual-onset "knot" to the left buttocks, which appeared two weeks ago. Patient states a 10.5/10 "knot" appeared on his buttocks several weeks ago. Patient reports associated pain and swelling. No modifying factors indicated. Patient denies any fever, chills, numbness, or weakness.   Patient is also complaining of sudden-onset, constant abdominal pain that began two weeks ago. Patient reports gradually worsening pain. Patient reports associated constipation x2 weeks and nausea. Patient states his pain is worse with eating, nothing improves his pain. Patient denies any vomiting.   The history is provided by the patient. No language interpreter was used.    Past Medical History:  Diagnosis Date  . Acid reflux   . DDD (degenerative disc disease), lumbar   . Diabetes mellitus without complication ([place])   . Enlarged prostate   . Hypertension   . Spinal stenosis, lumbar     There are no active problems to display for this patient.   Past Surgical History:  Procedure Laterality Date  . BACK SURGERY     cervical       Home Medications    Prior to Admission medications   Medication Sig Start Date End Date Taking? Authorizing Provider  doxazosin (CARDURA) 8 MG tablet Take 4 mg by mouth at bedtime. Takes 1/2 tablet    [provider]  HYDROcodone-acetaminophen (NORCO/VICODIN) 5-325 MG tablet Take 1-2 tablets by mouth every 6 (six) hours as needed for moderate pain.  02/25/17   Fredia Sorrow, MD  metFORMIN (GLUCOPHAGE) 1000 MG tablet Take 1,000 mg by mouth 2 (two) times daily with a meal.    [provider]  metFORMIN (GLUCOPHAGE) 500 MG tablet Take 1 tablet (500 mg total) by mouth 2 (two) times daily with a meal. 03/12/12 03/12/13  Rancour, Annie Main, MD  methocarbamol (ROBAXIN) 500 MG tablet Take 1 tablet (500 mg total) by mouth 2 (two) times daily. 08/03/14   Carlisle Cater, PA-C  oxyCODONE-acetaminophen (PERCOCET/ROXICET) 5-325 MG per tablet Take 1-2 tablets by mouth every 6 (six) hours as needed for severe pain. 08/03/14   Carlisle Cater, PA-C  ranitidine (ZANTAC) 150 MG tablet Take 150 mg by mouth 2 (two) times daily.    [provider]  traMADol (ULTRAM) 50 MG tablet Take 50 mg by mouth every 6 (six) hours as needed. Pain    [provider]  triamterene-hydrochlorothiazide (MAXZIDE) 75-50 MG per tablet Take 1 tablet by mouth daily.    [provider]    Family History No family history on file.  Social History Social History  Substance Use Topics  . Smoking status: Current Every Day Smoker    Packs/day: 0.50  . Smokeless tobacco: Never Used  . Alcohol use 7.2 oz/week    12 Cans of beer per week     Allergies   Patient has no known allergies.   Review of Systems Review of Systems  Constitutional: Negative for chills and fever.  Gastrointestinal: Positive for constipation and nausea. Negative for abdominal pain, diarrhea  and vomiting.  Skin:       Abscess to the buttocks.   Neurological: Negative for weakness and numbness.  All other systems reviewed and are negative.    Physical Exam Updated Vital Signs BP (!) 131/96 (BP Location: Left Arm)   Pulse (!) 110   Temp 99.6 F (37.6 C) (Oral)   Resp (!) 22   Ht 6\' 2"  (1.88 m)   Wt 209 lb 12.8 oz (95.2 kg)   SpO2 99%   BMI 26.94 kg/m   Physical Exam  Constitutional: He is oriented to person, place, and time. He appears well-developed and  well-nourished.  HENT:  Head: Normocephalic and atraumatic.  Eyes: EOM are normal. Pupils are equal, round, and reactive to light.  Neck: Normal range of motion. Neck supple. No JVD present.  Cardiovascular: Normal rate, regular rhythm and normal heart sounds.   No murmur heard. Pulmonary/Chest: Effort normal and breath sounds normal. He has no wheezes. He has no rales. He exhibits no tenderness.  Abdominal: Soft. Bowel sounds are normal. He exhibits distension. He exhibits no mass. There is no tenderness.  Abdomen mildly distended.   Genitourinary:  Genitourinary Comments: Chaperone present throughout entire exam. Large left gluteal abscess whit induration and tenderness. Induration extending to the anus.    Musculoskeletal: Normal range of motion. He exhibits no edema.  Lymphadenopathy:    He has no cervical adenopathy.  Neurological: He is alert and oriented to person, place, and time. No cranial nerve deficit. He exhibits normal muscle tone. Coordination normal.  Skin: Skin is warm and dry. No rash noted.  Psychiatric: He has a normal mood and affect. His behavior is normal. Judgment and thought content normal.  Nursing note and vitals reviewed.    ED Treatments / Results  DIAGNOSTIC STUDIES: Oxygen Saturation is 99% on RA, normal by my interpretation.    COORDINATION OF CARE: 2:12 AM Discussed treatment plan with pt at bedside and pt agreed to plan, which includes imaging.   Labs (all labs ordered are listed, but only abnormal results are displayed) Labs Reviewed  COMPREHENSIVE METABOLIC PANEL - Abnormal; Notable for the following:       Result Value   Sodium 131 (*)    Chloride 100 (*)    CO2 21 (*)    Glucose, Bld 245 (*)    Creatinine, Ser 1.46 (*)    ALT 14 (*)    GFR calc non Af Amer 48 (*)    GFR calc Af Amer 55 (*)    All other components within normal limits  CBC WITH DIFFERENTIAL/PLATELET - Abnormal; Notable for the following:    WBC 11.5 (*)    Neutro Abs  9.0 (*)    All other components within normal limits  PROTIME-INR - Abnormal; Notable for the following:    Prothrombin Time 16.5 (*)    All other components within normal limits  URINALYSIS, ROUTINE W REFLEX MICROSCOPIC - Abnormal; Notable for the following:    Hgb urine dipstick MODERATE (*)    Leukocytes, UA SMALL (*)    Squamous Epithelial / LPF 0-5 (*)    All other components within normal limits  I-STAT CG4 LACTIC ACID, ED - Abnormal; Notable for the following:    Lactic Acid, Venous 3.30 (*)    All other components within normal limits  CULTURE, BLOOD (ROUTINE X 2)  CULTURE, BLOOD (ROUTINE X 2)  URINE CULTURE  I-STAT CG4 LACTIC ACID, ED    Radiology Dg Chest 2 View  Result Date: 03/26/2017 CLINICAL DATA:  Fever abdominal pain. EXAM: CHEST  2 VIEW COMPARISON:  None. FINDINGS: The lungs are clear. The pulmonary vasculature is normal. Heart size is normal. Hilar and mediastinal contours are unremarkable. There is no pleural effusion. IMPRESSION: No active cardiopulmonary disease. Electronically Signed   By: Andreas Newport M.D.   On: 03/26/2017 01:10   Ct Abdomen Pelvis W Contrast  Result Date: 03/26/2017 CLINICAL DATA:  66 y/o  M; abdominal bloating, left gluteal abscess. EXAM: CT ABDOMEN AND PELVIS WITH CONTRAST TECHNIQUE: Multidetector CT imaging of the abdomen and pelvis was performed using the standard protocol following bolus administration of intravenous contrast. CONTRAST:  132mL ISOVUE-300 IOPAMIDOL (ISOVUE-300) INJECTION 61% COMPARISON:  11/20/2011 lumbar spine MRI. FINDINGS: Lower chest: No acute abnormality. Hepatobiliary: No focal liver abnormality is seen. No gallstones, gallbladder wall thickening, or biliary dilatation. Pancreas: Unremarkable. No pancreatic ductal dilatation or surrounding inflammatory changes. Spleen: Normal in size without focal abnormality. Adrenals/Urinary Tract: 13 mm right and 19 mm left adrenal nodule (series 3, image 24 and series 6, image 96).  No focal kidney lesion is identified. No urinary stone disease or obstructive uropathy. Normal bladder. Stomach/Bowel: Stomach is within normal limits. Appendix appears normal. No evidence of bowel wall thickening, distention, or inflammatory changes. Moderate volume of stool throughout the colon. Vascular/Lymphatic: Aortic atherosclerosis. No enlarged abdominal or pelvic lymph nodes. Reproductive: Moderate enlargement of prostate. Other: Small paraumbilical hernia containing fat. Musculoskeletal: Partially visualize left gluteal fold abscess measuring at least 3.2 cm (series 3, image 96). Surrounding inflammatory changes in subcutaneous fat of the left buttocks. Superior endplate fracture of W41 vertebral body with mild 30-40% loss of height appears recent. Advanced lumbar spine degenerative changes greatest at the L4-5 and L5-S1 levels. Multifactorial moderate to severe L2-3 through L5-S1 canal stenosis. Stable mild anterior chronic compression deformity of L2. IMPRESSION: 1. Bilateral indeterminate adrenal nodules measuring up to 19 mm. In the absence of primary malignancy these are probably benign, consider 12 month follow-up with adrenal CT. 2. Moderate volume of stool throughout the colon, question constipation. 3. Moderate prostate enlargement. 4. Partially visualized left gluteal fold abscess measuring at least 3.2 cm with surrounding inflammatory changes of left buttocks subcutaneous fat. 5. T12 vertebral body superior endplate fracture with mild 30-40% loss of height appears recent, correlate for focal pain. 6. Advanced degenerative changes of the lumbar spine with multiple levels of moderate to severe canal stenosis. Electronically Signed   By: Kristine Garbe M.D.   On: 03/26/2017 04:14    Procedures Procedures (including critical care time) CRITICAL CARE Performed by: LKGMW,NUUVO Total critical care time: 60 minutes Critical care time was exclusive of separately billable procedures and  treating other patients. Critical care was necessary to treat or prevent imminent or life-threatening deterioration. Critical care was time spent personally by me on the following activities: development of treatment plan with patient and/or surrogate as well as nursing, discussions with consultants, evaluation of patient's response to treatment, examination of patient, obtaining history from patient or surrogate, ordering and performing treatments and interventions, ordering and review of laboratory studies, ordering and review of radiographic studies, pulse oximetry and re-evaluation of patient's condition.  Medications Ordered in ED Medications  piperacillin-tazobactam (ZOSYN) IVPB 3.375 g (not administered)  vancomycin (VANCOCIN) IVPB 1000 mg/200 mL premix (not administered)  doxazosin (CARDURA) tablet 4 mg (not administered)  triamterene-hydrochlorothiazide (MAXZIDE) 75-50 MG per tablet 1 tablet (not administered)  morphine 4 MG/ML injection 1-4 mg (not administered)  LORazepam (ATIVAN) injection 0.5  mg (not administered)  enoxaparin (LOVENOX) injection 40 mg (not administered)  0.9 %  sodium chloride infusion (not administered)  acetaminophen (TYLENOL) tablet 650 mg (not administered)    Or  acetaminophen (TYLENOL) suppository 650 mg (not administered)  senna (SENOKOT) tablet 8.6 mg (not administered)  insulin aspart (novoLOG) injection 0-15 Units (not administered)  polyethylene glycol (MIRALAX / GLYCOLAX) packet 17 g (not administered)  acetaminophen (TYLENOL) tablet 650 mg (650 mg Oral Given 03/26/17 0044)  piperacillin-tazobactam (ZOSYN) IVPB 3.375 g (0 g Intravenous Stopped 03/26/17 0633)  vancomycin (VANCOCIN) IVPB 1000 mg/200 mL premix (0 mg Intravenous Stopped 03/26/17 0633)  sodium chloride 0.9 % bolus 1,000 mL (0 mLs Intravenous Stopped 03/26/17 0633)    And  sodium chloride 0.9 % bolus 1,000 mL (0 mLs Intravenous Stopped 03/26/17 7544)    And  sodium chloride 0.9 % bolus 1,000  mL (0 mLs Intravenous Stopped 03/26/17 0633)  morphine 4 MG/ML injection 4 mg (4 mg Intravenous Given 03/26/17 0234)  LORazepam (ATIVAN) injection 0.5 mg (0.5 mg Intravenous Given 03/26/17 0232)  iopamidol (ISOVUE-300) 61 % injection (100 mLs  Contrast Given 03/26/17 0320)     Initial Impression / Assessment and Plan / ED Course  I have reviewed the triage vital signs and the nursing notes.  Pertinent labs & imaging results that were available during my care of the patient were reviewed by me and considered in my medical decision making (see chart for details).  Abscess of the left buttock in the diabetic patient. He does have tachycardia and fever, and initial lactic acid is elevated which does qualify for sepsis. He was started on sepsis protocol and received early goal-directed fluid and treatment. He was also started on antibiotics. There was concern that the abscess was too close to the rectum for drainage in the ED. He was sent for CT of abdomen and pelvis, but unfortunately, CT scan ended without showing the complete abscess and extensive abscess. Subsequent lactic acid level has normalized. Case is discussed with Dr. Juleen China of internal medicine teaching service, who agrees to admit the patient. Consultation has been requested with general surgery for incision and drainage. Of note, laboratory evaluation does show evidence of mild acute kidney injury. He is already received adequate hydration. Mild hyponatremia is present which is not of any clinical significance. Urinalysis does appear to show urinary tract infection with too numerous to count WBCs, but no bacteria seen. The specimen has been sent for culture.  Final Clinical Impressions(s) / ED Diagnoses   Final diagnoses:  Abscess of left buttock  Sepsis, due to unspecified organism [facility])  Urinary tract infection without hematuria, site unspecified  Hyponatremia  Acute kidney injury (nontraumatic) ([facility])    New Prescriptions New  Prescriptions   No medications on file   I personally performed the services described in this documentation, which was scribed in my presence. The recorded information has been reviewed and is accurate.       Delora Fuel, MD 92/01/00 [id number]    Delora Fuel, MD 97/58/83 [phone]

## 2017-03-26 NOTE — Anesthesia Procedure Notes (Signed)
Procedure Name: Intubation Date/Time: 03/26/2017 10:17 AM Performed by: Julieta Bellini Pre-anesthesia Checklist: Patient identified, Emergency Drugs available, Suction available and Patient being monitored Patient Re-evaluated:Patient Re-evaluated prior to inductionOxygen Delivery Method: Circle system utilized Preoxygenation: Pre-oxygenation with 100% oxygen Intubation Type: IV induction Ventilation: Oral airway inserted - appropriate to patient size, Two handed mask ventilation required and Mask ventilation with difficulty Laryngoscope Size: Mac Grade View: Grade I Tube type: Oral Tube size: 7.5 mm Number of attempts: 1 Airway Equipment and Method: Stylet Placement Confirmation: ETT inserted through vocal cords under direct vision,  positive ETCO2 and breath sounds checked- equal and bilateral Secured at: 23 cm Tube secured with: Tape Dental Injury: Teeth and Oropharynx as per pre-operative assessment

## 2017-03-26 NOTE — Progress Notes (Signed)
Pharmacy Antibiotic Note Tim Bradley is a 66 y.o. male admitted on 03/25/2017 with left buttocks cellulitis. Pharmacy has been consulted for Zosyn and vancomycin dosing.  Plan: 1. Vancomycin 1000 IV every 12 hours.  Goal trough 15-20 mcg/mL. 2. Zosyn 3.375g IV q8h (4 hour infusion).  3. SCr every 72 hours while on vancomycin   Height: 6\' 2"  (188 cm) Weight: 209 lb 12.8 oz (95.2 kg) IBW/kg (Calculated) : 82.2  Temp (24hrs), Avg:99.9 F (37.7 C), Min:98.9 F (37.2 C), Max:101.1 F (38.4 C)   Recent Labs Lab 03/26/17 0054 03/26/17 0120  WBC 11.5*  --   CREATININE 1.46*  --   LATICACIDVEN  --  3.30*    Estimated Creatinine Clearance: 56.3 mL/min (A) (by C-G formula based on SCr of 1.46 mg/dL (H)).    No Known Allergies  Antimicrobials this admission: 6/28 Zosyn >>  6/28 vancomycin >>   Microbiology results: px  Thank you for allowing pharmacy to be a part of this patient's care.  Vincenza Hews, PharmD, BCPS 03/26/2017, 3:07 AM

## 2017-03-26 NOTE — Op Note (Signed)
Preoperative diagnosis: perirectal abscess Postoperative diagnosis: same as above Procedure: Incision and drainage of perirectal abscess Surgeon: Dr Serita Grammes Anes: general Specimens cultures to micro EBL minimal Drains none Complications none Sponge and needle count correct dispo to recovery stable   Indications: this is a 54 yom who has a week history of a left buttock abscess that is worsening. He came in overnight and I have evaluated him. Discussed going to the OR for incision and drainage today.   Procedure: After informed consent obtained patient taken to the OR. He was already on abx.  He had scds in place.  He was placed under general anesthesia without complication.  He was rolled prone and appropriately padded.  He was prepped and draped in standard sterile surgical fashion. Timeout was performed.  He had a large left sided perirectal abscess. I made elliptical incision over this and drained purulence.  I took cultures although he had already been given antibiotics.  I then irrigated the cavity and placed packing. Dressing was placed. Tolerated well, extubated and transferred to recovery.

## 2017-03-26 NOTE — ED Notes (Signed)
Wait for treatment room explained.

## 2017-03-26 NOTE — Transfer of Care (Signed)
Immediate Anesthesia Transfer of Care Note  Patient: Tim Bradley  Procedure(s) Performed: Procedure(s): IRRIGATION AND DEBRIDEMENT PERIRECTAL ABSCESS (N/A)  Patient Location: PACU  Anesthesia Type:General  Level of Consciousness: awake, alert , oriented and patient cooperative  Airway & Oxygen Therapy: Patient Spontanous Breathing and Patient connected to nasal cannula oxygen  Post-op Assessment: Report given to RN, Post -op Vital signs reviewed and stable and Patient moving all extremities X 4  Post vital signs: Reviewed and stable  Last Vitals:  Vitals:   03/26/17 0730 03/26/17 0800  BP: 127/83 120/66  Pulse: 79 87  Resp: 16 16  Temp:      Last Pain:  Vitals:   03/26/17 0412  TempSrc:   PainSc: Asleep         Complications: No apparent anesthesia complications

## 2017-03-26 NOTE — ED Notes (Signed)
Vital signs reviewed.  Pt has went outside with son to smoke.  When he returns vitals to be rechecked with temp.

## 2017-03-26 NOTE — ED Notes (Signed)
Surgery at bedside.

## 2017-03-26 NOTE — Anesthesia Preprocedure Evaluation (Addendum)
Anesthesia Evaluation  Patient identified by MRN, date of birth, ID band Patient awake    Reviewed: Allergy & Precautions, H&P , NPO status , Patient's Chart, lab work & pertinent test results  Airway Mallampati: II  TM Distance: >3 FB Neck ROM: Full    Dental no notable dental hx. (+) Upper Dentures, Edentulous Lower, Dental Advisory Given   Pulmonary Current Smoker,    Pulmonary exam normal breath sounds clear to auscultation       Cardiovascular hypertension, Pt. on medications  Rhythm:Regular Rate:Normal     Neuro/Psych negative neurological ROS  negative psych ROS   GI/Hepatic Neg liver ROS, GERD  Medicated and Controlled,  Endo/Other  diabetes, Type 2, Oral Hypoglycemic Agents  Renal/GU negative Renal ROS  negative genitourinary   Musculoskeletal  (+) Arthritis , Osteoarthritis,    Abdominal   Peds  Hematology negative hematology ROS (+)   Anesthesia Other Findings   Reproductive/Obstetrics negative OB ROS                            Anesthesia Physical Anesthesia Plan  ASA: II  Anesthesia Plan: General   Post-op Pain Management:    Induction: Intravenous  PONV Risk Score and Plan: 2 and Ondansetron and Midazolam  Airway Management Planned: LMA and Oral ETT  Additional Equipment:   Intra-op Plan:   Post-operative Plan: Extubation in OR  Informed Consent: I have reviewed the patients History and Physical, chart, labs and discussed the procedure including the risks, benefits and alternatives for the proposed anesthesia with the patient or authorized representative who has indicated his/her understanding and acceptance.   Dental advisory given  Plan Discussed with: CRNA  Anesthesia Plan Comments:        Anesthesia Quick Evaluation

## 2017-03-26 NOTE — Consult Note (Signed)
Date: 03/26/2017               Patient Name:  Tim Bradley MRN: [id number]  DOB: [birth year] Age / Sex: 66 y.o., male   PCP: System, Pcp Not In         Requesting Physician: Dr. Heber [place], Rachel Moulds, DO    Consulting Reason:  Diabetes management      Chief Complaint: Left gluteal pain   History of Present Illness: Mr. Coffield is a 66yo man with PMHx of type 2 DM, HTN, severe spinal stenosis, BPH, and GERD who presents with a 2 week hx of left gluteal pain. He reports the pain gradually worsened and is now severe which prompted him to go to the emergency room. He has been using a heating pad over the area which provides temporary relief. He has also been using 2 pills of Tramadol a few times a day that does not provide any relief. He denies any drainage from the area. He denies any fevers or chills, but does does report some nausea. He also notes constipation that started about 1 month ago after he lifted a heavy box and had severe pain in his back. He was prescribed the Tramadol for his back pain. He describes having a bowel movement every 3-4 days and having to strain. He has tried an OTC laxative but did not have relief. His last BM was yesterday.   He takes Metformin for his diabetes. He has a meter at home but does not check his sugars regularly. No prior A1c available in records. Blood sugar on admission is 245.   Meds: Current Facility-Administered Medications  Medication Dose Route Frequency Provider Last Rate Last Dose  . 0.9 %  sodium chloride infusion   Intravenous Continuous Saifan Rayford, Sindy Guadeloupe, MD 100 mL/hr at 03/26/17 0826    . acetaminophen (TYLENOL) tablet 650 mg  650 mg Oral Q6H PRN Giavanna Kang J, MD       Or  . acetaminophen (TYLENOL) suppository 650 mg  650 mg Rectal Q6H PRN Bladyn Tipps J, MD      . doxazosin (CARDURA) tablet 4 mg  4 mg Oral QHS Miller, Brooke A, PA-C      . [START ON 03/27/2017] enoxaparin (LOVENOX) injection 40 mg  40 mg Subcutaneous Q24H Madelena Maturin J, MD      .  insulin aspart (novoLOG) injection 0-15 Units  0-15 Units Subcutaneous Q4H Layaan Mott J, MD      . LORazepam (ATIVAN) injection 0.5 mg  0.5 mg Intravenous Q6H PRN Izora Gala A, PA-C   0.5 mg at 03/26/17 0819  . morphine 4 MG/ML injection 1-4 mg  1-4 mg Intravenous Q2H PRN Izora Gala A, PA-C      . piperacillin-tazobactam (ZOSYN) IVPB 3.375 g  3.375 g Intravenous T4S Delora Fuel, MD      . polyethylene glycol (MIRALAX / GLYCOLAX) packet 17 g  17 g Oral Daily Epimenio Schetter J, MD      . senna (SENOKOT) tablet 8.6 mg  1 tablet Oral BID Kmarion Rawl J, MD      . triamterene-hydrochlorothiazide (MAXZIDE) 75-50 MG per tablet 1 tablet  1 tablet Oral Daily Izora Gala A, PA-C      . vancomycin (VANCOCIN) IVPB 1000 mg/200 mL premix  1,000 mg Intravenous F68L Delora Fuel, MD       Current Outpatient Prescriptions  Medication Sig Dispense Refill  . clonazePAM (KLONOPIN) 1 MG tablet Take 1 mg by mouth 3 (three)  times daily.  5  . doxazosin (CARDURA) 8 MG tablet Take 4 mg by mouth at bedtime. Takes 1/2 tablet    . metFORMIN (GLUCOPHAGE) 500 MG tablet Take 500 mg by mouth 2 (two) times daily with a meal.    . ranitidine (ZANTAC) 150 MG tablet Take 150 mg by mouth 2 (two) times daily.    . traMADol (ULTRAM) 50 MG tablet Take 50 mg by mouth every 6 (six) hours as needed. Pain    . triamterene-hydrochlorothiazide (MAXZIDE) 75-50 MG per tablet Take 1 tablet by mouth daily.    Marland Kitchen HYDROcodone-acetaminophen (NORCO/VICODIN) 5-325 MG tablet Take 1-2 tablets by mouth every 6 (six) hours as needed for moderate pain. (Patient not taking: Reported on 03/26/2017) 20 tablet 0  . methocarbamol (ROBAXIN) 500 MG tablet Take 1 tablet (500 mg total) by mouth 2 (two) times daily. (Patient not taking: Reported on 03/26/2017) 15 tablet 0  . oxyCODONE-acetaminophen (PERCOCET/ROXICET) 5-325 MG per tablet Take 1-2 tablets by mouth every 6 (six) hours as needed for severe pain. (Patient not taking: Reported on 03/26/2017) 12  tablet 0    Allergies: Allergies as of 03/25/2017  . (No Known Allergies)   Past Medical History:  Diagnosis Date  . Acid reflux   . DDD (degenerative disc disease), lumbar   . Diabetes mellitus without complication ([place])   . Enlarged prostate   . Hypertension   . Spinal stenosis, lumbar    Past Surgical History:  Procedure Laterality Date  . BACK SURGERY     cervical   Family History  Problem Relation Age of Onset  . Hypertension Mother   . Cancer - Lung Father    Social History   Social History  . Marital status: Legally Separated    Spouse name: N/A  . Number of children: N/A  . Years of education: N/A   Occupational History  . Not on file.   Social History Main Topics  . Smoking status: Current Every Day Smoker    Packs/day: 0.50  . Smokeless tobacco: Never Used  . Alcohol use 7.2 oz/week    12 Cans of beer per week  . Drug use: Yes    Types: Cocaine, Marijuana  . Sexual activity: Not on file   Other Topics Concern  . Not on file   Social History Narrative   [place] veteran    Review of Systems: All negative except per HPI  Physical Exam: Blood pressure 120/66, pulse 87, temperature 99.6 F (37.6 C), temperature source Oral, resp. rate 16, height 6\' 2"  (1.88 m), weight 209 lb 12.8 oz (95.2 kg), SpO2 95 %. General: Well-nourished man resting in bed, NAD HEENT: [place]/AT, EOMI, sclera anicteric, mucus membranes moist CV: RRR, no m/g/r Pulm: CTA bilaterally, breaths non-labored Abd: BS+, soft, non-tender  Ext: warm, no peripheral edema, moves all Neuro: alert and oriented x 3, strength intact Skin: There is a large area of induration and tenderness of the left buttock. No erythema appreciated.  Lab results: WBC 11.5 Glucose 245 BUN 18 Cr 1.46 PT 16.5/INR 1.32 Lactic acid 3.3>1.2 UA moderate Hgb, sm leuks, RBC 6-30, WBCs TNTC, bacteria none  Imaging results:   CXR: No active process.  CT abd/pelvis:  IMPRESSION: 1. Bilateral  indeterminate adrenal nodules measuring up to 19 mm. In the absence of primary malignancy these are probably benign, consider 12 month follow-up with adrenal CT. 2. Moderate volume of stool throughout the colon, question constipation. 3. Moderate prostate enlargement. 4. Partially visualized left gluteal fold  abscess measuring at least 3.2 cm with surrounding inflammatory changes of left buttocks subcutaneous fat. 5. T12 vertebral body superior endplate fracture with mild 30-40% loss of height appears recent, correlate for focal pain. 6. Advanced degenerative changes of the lumbar spine with multiple levels of moderate to severe canal stenosis.  Other results: EKG: pending   Assessment, Plan, & Recommendations by Problem:  Left gluteal abscess: Patient presenting with a 2 week hx of worsening left gluteal pain found to have an abscess on exam and CT. He will go to the OR this morning for I&D. Agree with IV antibiotics for now but consider switching to oral antibiotics after surgery given he does not have significant erythema, no longer febrile, and WBC count mild.  - I&D today - Recommend obtaining cultures to guide antibiotic therapy - Continue Vanc/Zosyn - Consider switching to Doxy and Augmentin  Type 2 DM: No prior A1c in records. He is taking Metformin only so does not need to check his blood sugars at home. He does need follow up at the [facility] for better monitoring. Will start a moderate insulin sliding scale while inpatient and consider starting Lantus once eating after surgery. Blood sugars likely elevated due to his infection. - Start moderate ISS - CBGs Q4H then with meals and bedtime - Check HbA1c for baseline   Constipation: Likely secondary to opioids as patient has been taking Tramadol for several years and then recently had prescription for Percocet after hurting his back 1 month ago. Will need to start a bowel regimen as do not want him straining given his gluteal  abscess. - Start Miralax and Senokot   Pyuria and hematuria: UA with TNTC WBCs, sm leuks, 6-30 RBCs, and no bacteria. Patient without dysuria, gross hematuria, or frequency. This likely does not represent a UTI. Agree with getting urine cx. Patient did admit to having multiple sexual partners since his wife left him. Can consider checking for GC/chlamydia if urine cx negative.   Bilateral adrenal nodules: Noted on CT scan. Will need follow up adrenal CT in 12 months.   HTN: BP well controlled. - Continue home Maxzide   BPH: - Continue home Doxazosin   GERD: - Continue home Ranitidine    Diet: NPO for now, Carb mod after surgery DVT Ppx: Lovenox SQ Dispo: Discharge likely in 1-2 days   Signed: Albin Felling, MD, MPH Internal Medicine Resident, PGY-III Pager: [phone] 03/26/2017, 8:22 AM

## 2017-03-27 ENCOUNTER — Encounter (HOSPITAL_COMMUNITY): Payer: Self-pay | Admitting: General Surgery

## 2017-03-27 DIAGNOSIS — N400 Enlarged prostate without lower urinary tract symptoms: Secondary | ICD-10-CM | POA: Diagnosis not present

## 2017-03-27 DIAGNOSIS — F1410 Cocaine abuse, uncomplicated: Secondary | ICD-10-CM | POA: Diagnosis not present

## 2017-03-27 DIAGNOSIS — N390 Urinary tract infection, site not specified: Secondary | ICD-10-CM | POA: Diagnosis not present

## 2017-03-27 DIAGNOSIS — Z7984 Long term (current) use of oral hypoglycemic drugs: Secondary | ICD-10-CM | POA: Diagnosis not present

## 2017-03-27 DIAGNOSIS — K5900 Constipation, unspecified: Secondary | ICD-10-CM | POA: Diagnosis present

## 2017-03-27 DIAGNOSIS — K611 Rectal abscess: Secondary | ICD-10-CM | POA: Diagnosis not present

## 2017-03-27 DIAGNOSIS — E278 Other specified disorders of adrenal gland: Secondary | ICD-10-CM | POA: Diagnosis present

## 2017-03-27 DIAGNOSIS — I10 Essential (primary) hypertension: Secondary | ICD-10-CM | POA: Diagnosis not present

## 2017-03-27 DIAGNOSIS — A419 Sepsis, unspecified organism: Secondary | ICD-10-CM | POA: Diagnosis not present

## 2017-03-27 DIAGNOSIS — E119 Type 2 diabetes mellitus without complications: Secondary | ICD-10-CM | POA: Diagnosis not present

## 2017-03-27 DIAGNOSIS — K219 Gastro-esophageal reflux disease without esophagitis: Secondary | ICD-10-CM | POA: Diagnosis present

## 2017-03-27 DIAGNOSIS — F17210 Nicotine dependence, cigarettes, uncomplicated: Secondary | ICD-10-CM | POA: Diagnosis not present

## 2017-03-27 DIAGNOSIS — Z79899 Other long term (current) drug therapy: Secondary | ICD-10-CM | POA: Diagnosis not present

## 2017-03-27 DIAGNOSIS — E871 Hypo-osmolality and hyponatremia: Secondary | ICD-10-CM | POA: Diagnosis not present

## 2017-03-27 DIAGNOSIS — M48061 Spinal stenosis, lumbar region without neurogenic claudication: Secondary | ICD-10-CM | POA: Diagnosis present

## 2017-03-27 DIAGNOSIS — L0231 Cutaneous abscess of buttock: Secondary | ICD-10-CM | POA: Diagnosis not present

## 2017-03-27 DIAGNOSIS — N179 Acute kidney failure, unspecified: Secondary | ICD-10-CM | POA: Diagnosis not present

## 2017-03-27 LAB — CBC
HEMATOCRIT: 36.4 % — AB (ref 39.0–52.0)
Hemoglobin: 11.8 g/dL — ABNORMAL LOW (ref 13.0–17.0)
MCH: 28.9 pg (ref 26.0–34.0)
MCHC: 32.4 g/dL (ref 30.0–36.0)
MCV: 89.2 fL (ref 78.0–100.0)
Platelets: 266 10*3/uL (ref 150–400)
RBC: 4.08 MIL/uL — ABNORMAL LOW (ref 4.22–5.81)
RDW: 13.6 % (ref 11.5–15.5)
WBC: 8.1 10*3/uL (ref 4.0–10.5)

## 2017-03-27 LAB — BASIC METABOLIC PANEL
Anion gap: 6 (ref 5–15)
BUN: 10 mg/dL (ref 6–20)
CALCIUM: 8 mg/dL — AB (ref 8.9–10.3)
CO2: 23 mmol/L (ref 22–32)
Chloride: 105 mmol/L (ref 101–111)
Creatinine, Ser: 1.1 mg/dL (ref 0.61–1.24)
GFR calc Af Amer: >60 mL/min (ref >60)
Glucose, Bld: 165 mg/dL — ABNORMAL HIGH (ref 65–99)
POTASSIUM: 3.6 mmol/L (ref 3.5–5.1)
SODIUM: 134 mmol/L — AB (ref 135–145)

## 2017-03-27 LAB — GLUCOSE, CAPILLARY
GLUCOSE-CAPILLARY: 119 mg/dL — AB (ref 65–99)
GLUCOSE-CAPILLARY: 135 mg/dL — AB (ref 65–99)
GLUCOSE-CAPILLARY: 133 mg/dL — AB (ref 65–99)
GLUCOSE-CAPILLARY: 180 mg/dL — AB (ref 65–99)
Glucose-Capillary: 115 mg/dL — ABNORMAL HIGH (ref 65–99)

## 2017-03-27 LAB — URINE CULTURE

## 2017-03-27 LAB — HEMOGLOBIN A1C
Hgb A1c MFr Bld: 6.3 % — ABNORMAL HIGH (ref 4.8–5.6)
Mean Plasma Glucose: 134 mg/dL

## 2017-03-27 MED ORDER — INSULIN ASPART 100 UNIT/ML ~~LOC~~ SOLN: 0.0000 [IU] | Freq: Every day | SUBCUTANEOUS | Status: DC

## 2017-03-27 MED ORDER — MORPHINE SULFATE (PF) 4 MG/ML IV SOLN
1.0000 mg | Freq: Four times a day (QID) | INTRAVENOUS | Status: DC | PRN
  Administered 2017-03-27 – 2017-03-28 (×2): 2 mg via INTRAVENOUS
  Filled 2017-03-27 (×2): qty 1

## 2017-03-27 MED ORDER — SODIUM CHLORIDE 0.9 % IV SOLN
3.0000 g | Freq: Four times a day (QID) | INTRAVENOUS | Status: DC
  Administered 2017-03-27 – 2017-03-28 (×4): 3 g via INTRAVENOUS
  Filled 2017-03-27 (×5): qty 3

## 2017-03-27 MED ORDER — INSULIN ASPART 100 UNIT/ML ~~LOC~~ SOLN
0.0000 [IU] | Freq: Three times a day (TID) | SUBCUTANEOUS | Status: DC
  Administered 2017-03-27 – 2017-03-28 (×3): 2 [IU] via SUBCUTANEOUS

## 2017-03-27 MED ORDER — FAMOTIDINE 20 MG PO TABS
20.0000 mg | ORAL_TABLET | Freq: Two times a day (BID) | ORAL | Status: DC
  Administered 2017-03-27 – 2017-03-28 (×3): 20 mg via ORAL
  Filled 2017-03-27 (×3): qty 1

## 2017-03-27 MED ORDER — POLYETHYLENE GLYCOL 3350 17 G PO PACK
17.0000 g | PACK | Freq: Two times a day (BID) | ORAL | Status: DC
  Administered 2017-03-27 – 2017-03-28 (×3): 17 g via ORAL
  Filled 2017-03-27 (×3): qty 1

## 2017-03-27 MED ORDER — CLONAZEPAM 1 MG PO TABS
1.0000 mg | ORAL_TABLET | Freq: Three times a day (TID) | ORAL | Status: DC
  Administered 2017-03-27 – 2017-03-28 (×5): 1 mg via ORAL
  Filled 2017-03-27 (×5): qty 1

## 2017-03-27 MED ORDER — OXYCODONE-ACETAMINOPHEN 5-325 MG PO TABS
1.0000 | ORAL_TABLET | ORAL | Status: DC | PRN
  Administered 2017-03-27 – 2017-03-28 (×6): 2 via ORAL
  Filled 2017-03-27 (×7): qty 2

## 2017-03-27 NOTE — Discharge Instructions (Signed)
Disposable Sitz Bath A disposable sitz bath is a plastic basin that fits over the toilet. A bag is hung above the toilet, and the bag is connected to a tube that opens into the basin. The bag is filled with warm water that flows into the basin through the tube. A sitz bath can be used to help relieve symptoms, clean, and promote healing in the genital and anal areas, as well as in the lower abdomen and buttocks. What are the risks? Sitz baths are generally very safe. It is possible for the skin between the genitals and the anus (perineum) to become infected, but this is rare. You can avoid this by cleaning your sitz bath supplies thoroughly. How to use a disposable sitz bath 1. Close the clamp on the tube. Make sure the clamp is closed tightly to prevent leakage. 2. Fill the sitz bath basin and the plastic bag with warm water. The water should be warm enough to be comfortable, but not hot. 3. Raise the toilet seat and place the filled basin on the toilet. Make sure the overflow opening is facing toward the back of the toilet. ? If you prefer, you may place the empty basin on the toilet first, and then use the plastic bag to fill the basin with warm water. 4. Hang the filled plastic bag overhead on a hook or towel rack close to the toilet. The bag should be higher than the toilet so that the water will flow down through the tube. 5. Attach the tube to the opening on the basin. Make sure that the tube is attached to the basin tightly to prevent leakage. 6. Sit on the basin and release the clamp. This will allow warm water to flow into the basin and flush the area around your genitals and anus. 7. Remain sitting on the basin for about 15-20 minutes, or as long as told by your health care provider. 8. Stand up and gently pat your skin dry. If directed, apply clean bandages (dressings) to the affected area as told by your health care provider. 9. Carefully remove the basin from the toilet seat and tip the  basin into the toilet to empty any remaining water. Empty any remaining water from the plastic bag into the toilet. Then, flush the toilet. 10. Wash the basin with warm water and soap. Let the basin air dry in the sink. You should also let the plastic bag and the tubing air dry. 11. Store the basin, tubing, and plastic bag in a clean, dry area. 12. Wash your hands with soap and water. If soap and water are not available, use hand sanitizer. Contact a health care provider if:  You have symptoms that get worse instead of better.  You develop new skin irritation, redness, or swelling around your genitals or anus. This information is not intended to replace advice given to you by your health care provider. Make sure you discuss any questions you have with your health care provider. Document Released: 03/16/2012 Document Revised: 02/21/2016 Document Reviewed: 08/05/2015 Elsevier Interactive Patient Education  2018 [employer].    Outpatient Metformin Instructions (Glucophage, Glucovance, Fortamet, Riomet, [place], [place], Actoplus met  Avandamet, Janumet)   Patient: Tim Bradley                                                03/26/2017:  Radiology Exam:  CT abdomen and Pelvis with IV contrast   As part of your exam today in the Radiology Department, you were given a radiographic contrast material or x-ray dye.  Because you have had this contrast material and you are taking a Metformin drug (Glucophage, Glucovance, Avandamet, Fortamet, Riomet, Metaglip, Glumetza, Actoplus met, Actoplus Met XR, Prandimet or Janumet), please observe the following instructions:   DO NOT  Take this medication for 48 hours after your exam.  Because you have normal renal function and have no comorbidities, you may restart your medication in 48 hours with no need for a renal function test or consultation with your physician.  You have normal renal function but have some comorbidities.  Comorbidities  include liver disease, alcohol overuse, heart failure, myocardial or muscular ischemia, sepsis, or other severe infection.  Therefore you should consult your physician before restarting your medication.  You have impaired renal function.  You should consult your physician before restarting your medication and you are advised to get a renal function test before restarting your medication.  Please discuss this with your physician.   Call your doctor before you start taking this medication again.  Your doctor may want to check your kidney function before you start taking this medication again.  I understand these instructions and have had an opportunity to discuss them with Radiology Department personnel.

## 2017-03-27 NOTE — Progress Notes (Signed)
Internal Medicine Attending:   I saw and examined the patient. I reviewed the resident's note and I agree with the resident's findings and plan as documented in the resident's note. Patient reports feeling well, notes back pain improved, passing gas but no BM yet.  Vitals stable, WBC down to 8.1.  CBG at inpatient goal of <180.  Diabetes - Continue SSI, can restart metformin on discharge.  Left gluteal abscess - Gram stain G+ Cocci in pairs, G- coccobacilli- cultures pending. - Discussed with surgery, recommend deescalation to Unasyn (do not need Pseudomonal coverage of Zosyn), can consider transition to Augmentin versus further narrowing tomorrow.  Pyuria/ microscopic hematuria - F/U culture, at minimum will need repeat U/A at followup

## 2017-03-27 NOTE — Care Management Important Message (Signed)
Important Message  Patient Details  Name: Tim Bradley MRN: [id number] Date of Birth: [birth year]   Medicare Important Message Given:  Yes    Ella Bodo, RN 03/27/2017, 4:52 PM

## 2017-03-27 NOTE — Progress Notes (Signed)
   Subjective:  Patient states he feels well today; he denies fevers or chills. He was able to eat well after the surgery and has not had N/V. He is passing flatus.   Objective:  Vital signs in last 24 hours: Vitals:   03/26/17 1311 03/26/17 2019 03/27/17 0422 03/27/17 0643  BP: 137/75 108/67 107/60 116/73  Pulse: 88 82 88 92  Resp: (!) 21 20 18 18   Temp: 99.4 F (37.4 C) 98.7 F (37.1 C) 99 F (37.2 C) 98.1 F (36.7 C)  TempSrc: Oral Oral Oral Oral  SpO2: 97% 96% 95% 97%  Weight:      Height:       Constitutional: NAD, pleasant, eating breakfast CV: RRR, no murmurs, rubs or gallops appreciated Resp: CTAB, no increased work of breathing MSK: left perirectal abscess mildly draining with packing in place, no surrounding erythema, induration or tenderness  Assessment/Plan:  Active Problems:   Abscess, gluteal, left   Type 2 diabetes mellitus ([facility])   Adrenal nodule ([facility])   Closed fracture of body of thoracic vertebra ([facility])   Spinal stenosis of lumbar region  Left gluteal abscess:  S/p I&D on 6/28 by surgery who is primary. Cultures are pending though were obtained after antibiotics started. Prelim gram stain showing G+ cocci in pairs and G- coccobacilli. Patient remains afebrile and has no leukocytosis. --d/c Vanc/Zosyn --start Unasyn --f/u cultures  Type 2 DM:  A1c is 6.3; on metformin outpatient. CBG's relatively stable from 112-135 since last night.  --moderate ISS --CBGs qid wc/hs  Constipation:  Still no bowel movement; passing flatus. --Miralax and Senokot   Pyuria and hematuria:  Asymptomatic . --f/u Urine culture --consider repeat UA outpatient  Bilateral adrenal nodules:  Noted on CT scan. Will need follow up adrenal CT in 12 months.   HTN:  BP well controlled. --Continue home Maxzide   BPH: --Continue home Doxazosin   GERD: --Continue home Ranitidine   Dispo: Anticipated discharge in approximately 1-2 day(s) per surgery primary.    Alphonzo Grieve, MD 03/27/2017, 10:57 AM Pager [phone]

## 2017-03-27 NOTE — Progress Notes (Signed)
Pharmacy Antibiotic Note  Tim Bradley is a 66 y.o. male admitted on 03/25/2017 with L-buttocks cellulitis, s/p I & D of abscess.  Pharmacy has been consulted for Unasyn dosing.  Cultures growing Gm (+) cocci and Gm (-) coccobacillus, narrowing abx to Unasyn.  Last dose of Zosyn at 7AM.  Plan: Unasyn 3g IV q6, next dose at 1500 today Pharmacy sign off as no renal adjustment necessary  Height: 6\' 2"  (188 cm) Weight: 209 lb 12.8 oz (95.2 kg) IBW/kg (Calculated) : 82.2  Temp (24hrs), Avg:98.8 F (37.1 C), Min:98.1 F (36.7 C), Max:99.4 F (37.4 C)   Recent Labs Lab 03/26/17 0054 03/26/17 0120 03/26/17 0400 03/27/17 0441  WBC 11.5*  --   --  8.1  CREATININE 1.46*  --   --  1.10  LATICACIDVEN  --  3.30* 1.21  --     Estimated Creatinine Clearance: 74.7 mL/min (by C-G formula based on SCr of 1.1 mg/dL).    No Known Allergies  Antimicrobials this admission: Vanc 6/28 >> 6/29 Zosyn 6/28 >> 6/29 Unasyn 6/29 >>  Microbiology results: 6/28 left gluteal abscess - GPC / GNcoccobacilli on Gram stain 6/28 surgical screen - negative 6/28 BCx -  Thank you for allowing pharmacy to be a part of this patient's care.  Lewie Chamber., PharmD Clinical Pharmacist [facility]

## 2017-03-27 NOTE — Progress Notes (Signed)
Patient ID: Tim Bradley, male   DOB: 1951-06-28, 66 y.o.   MRN: [id number]  [facility] Progress Note  1 Day Post-Op  Subjective: CC- perirectal abscess Patient states that his pain is much less today. He is able to sit in bed. Tolerating regular diet. Has not yet had BM, on miralax and senokot.  Objective: Vital signs in last 24 hours: Temp:  [98.1 F (36.7 C)-99.4 F (37.4 C)] 98.1 F (36.7 C) (06/29 0643) Pulse Rate:  [82-100] 92 (06/29 0643) Resp:  [18-27] 18 (06/29 0643) BP: (100-137)/(54-75) 116/73 (06/29 0643) SpO2:  [92 %-97 %] 97 % (06/29 0643) Last BM Date: 03/20/17  Intake/Output from previous day: 06/28 0701 - 06/29 0700 In: 1583.8 [P.O.:540; I.V.:743.8; IV Piggyback:300] Out: 5397 [Urine:1425; Blood:3] Intake/Output this shift: No intake/output data recorded.  PE: Gen:  Alert, NAD, pleasant HEENT: EOM's intact, pupils equal  Pulm:  CTAB, no W/R/R, effort normal Abd: Soft, NT/ND, +BS Ext:  No erythema, edema, or tenderness BUE/BLE  GU: perirectal abscess s/p I&D with packing in place, less tenderness and induration today, no active drainage noted >> ABD pad changed  Lab Results:   Recent Labs  03/26/17 0054 03/27/17 0441  WBC 11.5* 8.1  HGB 14.7 11.8*  HCT 45.5 36.4*  PLT 303 266   BMET  Recent Labs  03/26/17 0054 03/27/17 0441  NA 131* 134*  K 3.9 3.6  CL 100* 105  CO2 21* 23  GLUCOSE 245* 165*  BUN 18 10  CREATININE 1.46* 1.10  CALCIUM 9.3 8.0*   PT/INR  Recent Labs  03/26/17 0054  LABPROT 16.5*  INR 1.32   CMP     Component Value Date/Time   NA 134 (L) 03/27/2017 0441   K 3.6 03/27/2017 0441   CL 105 03/27/2017 0441   CO2 23 03/27/2017 0441   GLUCOSE 165 (H) 03/27/2017 0441   BUN 10 03/27/2017 0441   CREATININE 1.10 03/27/2017 0441   CALCIUM 8.0 (L) 03/27/2017 0441   PROT 7.3 03/26/2017 0054   ALBUMIN 3.6 03/26/2017 0054   AST 21 03/26/2017 0054   ALT 14 (L) 03/26/2017 0054   ALKPHOS 91 03/26/2017 0054    BILITOT 0.9 03/26/2017 0054   GFRNONAA >60 03/27/2017 0441   GFRAA >60 03/27/2017 0441   Lipase  No results found for: LIPASE     Studies/Results: Dg Chest 2 View  Result Date: 03/26/2017 CLINICAL DATA:  Fever abdominal pain. EXAM: CHEST  2 VIEW COMPARISON:  None. FINDINGS: The lungs are clear. The pulmonary vasculature is normal. Heart size is normal. Hilar and mediastinal contours are unremarkable. There is no pleural effusion. IMPRESSION: No active cardiopulmonary disease. Electronically Signed   By: Andreas Newport M.D.   On: 03/26/2017 01:10   Ct Abdomen Pelvis W Contrast  Result Date: 03/26/2017 CLINICAL DATA:  66 y/o  M; abdominal bloating, left gluteal abscess. EXAM: CT ABDOMEN AND PELVIS WITH CONTRAST TECHNIQUE: Multidetector CT imaging of the abdomen and pelvis was performed using the standard protocol following bolus administration of intravenous contrast. CONTRAST:  174mL ISOVUE-300 IOPAMIDOL (ISOVUE-300) INJECTION 61% COMPARISON:  11/20/2011 lumbar spine MRI. FINDINGS: Lower chest: No acute abnormality. Hepatobiliary: No focal liver abnormality is seen. No gallstones, gallbladder wall thickening, or biliary dilatation. Pancreas: Unremarkable. No pancreatic ductal dilatation or surrounding inflammatory changes. Spleen: Normal in size without focal abnormality. Adrenals/Urinary Tract: 13 mm right and 19 mm left adrenal nodule (series 3, image 24 and series 6, image 96). No focal kidney lesion is  identified. No urinary stone disease or obstructive uropathy. Normal bladder. Stomach/Bowel: Stomach is within normal limits. Appendix appears normal. No evidence of bowel wall thickening, distention, or inflammatory changes. Moderate volume of stool throughout the colon. Vascular/Lymphatic: Aortic atherosclerosis. No enlarged abdominal or pelvic lymph nodes. Reproductive: Moderate enlargement of prostate. Other: Small paraumbilical hernia containing fat. Musculoskeletal: Partially visualize  left gluteal fold abscess measuring at least 3.2 cm (series 3, image 96). Surrounding inflammatory changes in subcutaneous fat of the left buttocks. Superior endplate fracture of L37 vertebral body with mild 30-40% loss of height appears recent. Advanced lumbar spine degenerative changes greatest at the L4-5 and L5-S1 levels. Multifactorial moderate to severe L2-3 through L5-S1 canal stenosis. Stable mild anterior chronic compression deformity of L2. IMPRESSION: 1. Bilateral indeterminate adrenal nodules measuring up to 19 mm. In the absence of primary malignancy these are probably benign, consider 12 month follow-up with adrenal CT. 2. Moderate volume of stool throughout the colon, question constipation. 3. Moderate prostate enlargement. 4. Partially visualized left gluteal fold abscess measuring at least 3.2 cm with surrounding inflammatory changes of left buttocks subcutaneous fat. 5. T12 vertebral body superior endplate fracture with mild 30-40% loss of height appears recent, correlate for focal pain. 6. Advanced degenerative changes of the lumbar spine with multiple levels of moderate to severe canal stenosis. Electronically Signed   By: Kristine Garbe M.D.   On: 03/26/2017 04:14    Anti-infectives: Anti-infectives    Start     Dose/Rate Route Frequency Ordered Stop   03/26/17 1100  vancomycin (VANCOCIN) IVPB 1000 mg/200 mL premix     1,000 mg 200 mL/hr over 60 Minutes Intravenous Every 12 hours 03/26/17 0304     03/26/17 1000  piperacillin-tazobactam (ZOSYN) IVPB 3.375 g  Status:  Discontinued     3.375 g 100 mL/hr over 30 Minutes Intravenous To Surgery 03/26/17 0949 03/26/17 1308   03/26/17 0900  piperacillin-tazobactam (ZOSYN) IVPB 3.375 g     3.375 g 12.5 mL/hr over 240 Minutes Intravenous Every 8 hours 03/26/17 0248     03/26/17 0215  piperacillin-tazobactam (ZOSYN) IVPB 3.375 g     3.375 g 100 mL/hr over 30 Minutes Intravenous  Once 03/26/17 0212 03/26/17 3428   03/26/17 0215   vancomycin (VANCOCIN) IVPB 1000 mg/200 mL premix     1,000 mg 200 mL/hr over 60 Minutes Intravenous  Once 03/26/17 7681 03/26/17 1572       Assessment/Plan DM - SSI BPH Anxiety - restart home klonopin Tobacco abuse Illicit drug use - last use of cocaine 2-3 days ago AKI - resolved with IVF Hyponatremia - improving with IVF Constipation - miralax and senokot  Perirectal abscess S/p Incision and drainage of perirectal abscess 6/28 Dr. Donne Hazel - POD 1 - WBC WNL, afebrile - gram stain growing gram positive cocci and gram negative coccobacilli, culture pending  ID - zosyn/vancomycin 6/28>> FEN - carb modified diet VTE - SCDs, lovenox  Plan - keep packing in today, plan to remove tomorrow and likely start sitz baths. Follow culture and narrow abx as able. Continue miralax/senokot for now, may need enema or suppository if constipation not improving.   LOS: 0 days    Jerrye Beavers , [facility] 03/27/2017, 8:52 AM Pager: [phone] Consults: [phone] Mon-Fri 7:00 am-4:30 pm Sat-Sun 7:00 am-11:30 am

## 2017-03-28 LAB — CBC
HEMATOCRIT: 38.5 % — AB (ref 39.0–52.0)
HEMOGLOBIN: 12.5 g/dL — AB (ref 13.0–17.0)
MCH: 28.7 pg (ref 26.0–34.0)
MCHC: 32.5 g/dL (ref 30.0–36.0)
MCV: 88.3 fL (ref 78.0–100.0)
Platelets: 273 10*3/uL (ref 150–400)
RBC: 4.36 MIL/uL (ref 4.22–5.81)
RDW: 13.3 % (ref 11.5–15.5)
WBC: 6.9 10*3/uL (ref 4.0–10.5)

## 2017-03-28 LAB — GLUCOSE, CAPILLARY
GLUCOSE-CAPILLARY: 125 mg/dL — AB (ref 65–99)
GLUCOSE-CAPILLARY: 152 mg/dL — AB (ref 65–99)
Glucose-Capillary: 135 mg/dL — ABNORMAL HIGH (ref 65–99)

## 2017-03-28 MED ORDER — SENNOSIDES-DOCUSATE SODIUM 8.6-50 MG PO TABS
2.0000 | ORAL_TABLET | Freq: Two times a day (BID) | ORAL | Status: DC
  Administered 2017-03-28: 2 via ORAL
  Filled 2017-03-28: qty 2

## 2017-03-28 MED ORDER — AMOXICILLIN-POT CLAVULANATE 875-125 MG PO TABS
1.0000 | ORAL_TABLET | Freq: Two times a day (BID) | ORAL | Status: DC
  Administered 2017-03-28: 1 via ORAL
  Filled 2017-03-28: qty 1

## 2017-03-28 MED ORDER — OXYCODONE-ACETAMINOPHEN 5-325 MG PO TABS: 1.0000 | ORAL_TABLET | ORAL | 0 refills | Status: DC | PRN

## 2017-03-28 MED ORDER — AMOXICILLIN-POT CLAVULANATE 875-125 MG PO TABS: 1.0000 | ORAL_TABLET | Freq: Two times a day (BID) | ORAL | 0 refills | Status: DC

## 2017-03-28 NOTE — Discharge Summary (Signed)
Patient ID: Tim Bradley [id number] 66 y.o. [birth year]  Admit date: 03/25/2017  Discharge date and time: 03/28/2017  Admitting Physician: Rolm Bookbinder  Discharge Physician: Adin Hector  Admission Diagnoses: Hyponatremia [E87.1] Acute kidney injury (nontraumatic) ([place]) [N17.9] Abscess of left buttock [L02.31] Sepsis, due to unspecified organism [facility]) [A41.9] Urinary tract infection without hematuria, site unspecified [N39.0]  Discharge Diagnoses: Abscess of left buttock                                          History cocaine abuse                                          Hyponatremia                                           Bilateral adrenal nodules.                                          Hypertension.  Well controlled.  Operations: Procedure(s): IRRIGATION AND DEBRIDEMENT PERIRECTAL ABSCESS  Admission Condition: fair  Discharged Condition: good  Indication for Admission: This is a 66 year old African American male with past medical history of non-insulin dependent diabetes, hypertension, cocaine abuse, tobacco abuse, BPH.Marland Kitchen  He presented with a two-week history of progressive left gluteal pain and mass.  Worse with sitting.     Denies drainage.  CT scan showed left gluteal fold abscess measuring 3.2 cm.  WBC 11,500, lactic acid 3.3, repeat 1.21.  Fever 101.1.  Hospital Course: The patient was admitted started on IV fluids.  Start on IV vancomycin and Zosyn.  The patient was taken to the operating room on the day of admission by Dr. Donne Hazel and underwent incision and drainage of perirectal abscess.  The wound was packed on postop day 1 he was stable but having too much pain to go home.  Postop day 2 he was feeling better and was willing to go home.  He was tolerating diet.  Voiding uneventfully.    He was followed by internal medicine while in the hospital who noted his multiple medical problems and documented follow-up care for diabetes, BPH, hypertension,  incidental adrenal nodules.    On postop day 2 I removed all of his packing and examined the wound.  He was still a little tender still has some induration but it looks much better.  There was no.  No sore odor.  There was no bleeding.  The wound was loosely repacked.     He was given an 8 day prescription for Augmentin.  He was given a prescription for oxycodone.  He was told to remove the packing on Monday, 48 hours following discharge, and not to repack.  Warm tub baths 3 times a day were recommended.  He was advised follow-up in the [place] [facility] on July 12.  Consults: Internal medicine  Significant Diagnostic Studies: Lab work.  CT scan.  Cultures.  Treatments: surgery: Incision and drainage left perirectal abscess  Disposition: Home  Patient Instructions:  Allergies as of 03/28/2017   No Known Allergies  Medication List    TAKE these medications   amoxicillin-clavulanate 875-125 MG tablet Commonly known as:  AUGMENTIN Take 1 tablet by mouth every 12 (twelve) hours.   clonazePAM 1 MG tablet Commonly known as:  KLONOPIN Take 1 mg by mouth 3 (three) times daily.   doxazosin 8 MG tablet Commonly known as:  CARDURA Take 4 mg by mouth at bedtime. Takes 1/2 tablet   HYDROcodone-acetaminophen 5-325 MG tablet Commonly known as:  NORCO/VICODIN Take 1-2 tablets by mouth every 6 (six) hours as needed for moderate pain.   metFORMIN 500 MG tablet Commonly known as:  GLUCOPHAGE Take 500 mg by mouth 2 (two) times daily with a meal.   methocarbamol 500 MG tablet Commonly known as:  ROBAXIN Take 1 tablet (500 mg total) by mouth 2 (two) times daily.   oxyCODONE-acetaminophen 5-325 MG tablet Commonly known as:  PERCOCET/ROXICET Take 1-2 tablets by mouth every 6 (six) hours as needed for severe pain. What changed:  Another medication with the same name was added. Make sure you understand how and when to take each.   oxyCODONE-acetaminophen 5-325 MG  tablet Commonly known as:  PERCOCET/ROXICET Take 1 tablet by mouth every 4 (four) hours as needed for moderate pain or severe pain. What changed:  You were already taking a medication with the same name, and this prescription was added. Make sure you understand how and when to take each.   ranitidine 150 MG tablet Commonly known as:  ZANTAC Take 150 mg by mouth 2 (two) times daily.   traMADol 50 MG tablet Commonly known as:  ULTRAM Take 50 mg by mouth every 6 (six) hours as needed. Pain   triamterene-hydrochlorothiazide 75-50 MG tablet Commonly known as:  MAXZIDE Take 1 tablet by mouth daily.       Activity: activity as tolerated Diet: diabetic diet Wound Care: as directed  Follow-up:  With [facility] surgery, [place] clinic in 2 weeks.  Signed: Edsel Petrin. Dalbert Batman, M.D., FACS General and minimally invasive surgery Breast and Colorectal Surgery  Addendum: I logged onto the [place] website and reviewed his prescription medication history  03/28/2017, 11:30 AM

## 2017-03-28 NOTE — Progress Notes (Signed)
   Subjective:  Patient reports feeling well. States he has been eating his meals. No nausea, vomiting, or abdominal pain. States his last bowel movement was a week ago that he continues to pass flatus. States the pain at the site of the abscess in his left buttock is currently well-controlled. No other complaints.  Objective:  Vital signs in last 24 hours: Vitals:   03/27/17 0643 03/27/17 1350 03/27/17 2128 03/28/17 0629  BP: 116/73 126/73 120/70 122/74  Pulse: 92 91 90 84  Resp: 18 19 18 17   Temp: 98.1 F (36.7 C) 98.2 F (36.8 C) 98 F (36.7 C) 98.3 F (36.8 C)  TempSrc: Oral Oral Oral   SpO2: 97% 97% 96% 97%  Weight:      Height:       Constitutional: NAD, Lying comfortably in bed CV: RRR, no murmurs, rubs or gallops appreciated Resp: CTAB, no increased work of breathing Abdomen: Soft, nontender, nondistended, positive bowel sounds MSK: left perirectal abscess mildly draining with packing in place, no surrounding erythema, induration or tenderness  Assessment/Plan:  Active Problems:   Abscess, gluteal, left   Type 2 diabetes mellitus (HCC)   Adrenal nodule (HCC)   Closed fracture of body of thoracic vertebra (HCC)   Spinal stenosis of lumbar region  Left gluteal abscess:  S/p I&D on 6/28 by surgery who is primary. Cultures are pending though were obtained after antibiotics started. Prelim gram stain showing G+ cocci in pairs and G- coccobacilli. Patient remains afebrile and has no leukocytosis. --Continue Unasyn --f/u cultures  Type 2 DM:  A1c is 6.3; on metformin outpatient. CBG's relatively stable in the 100s. CBG 135 this morning.   --moderate ISS --CBGs qid wc/hs  Constipation:  Still no bowel movement; passing flatus. --Miralax --Senokot-S 2 tabs BID  Pyuria and hematuria:  Asymptomatic. Urine culture with multiple species, likely contaminant.  --f/u Urine culture --consider repeat UA outpatient  Bilateral adrenal nodules:  Noted on CT scan. Will  need follow up adrenal CT in 12 months.   HTN:  BP well controlled. --Continue home Maxzide   BPH: --Continue home Doxazosin   GERD: --Continue home Ranitidine   Dispo: Anticipated discharge in approximately 1-2 day(s) per surgery primary.   Shela Leff, MD 03/28/2017, 11:09 AM Pager [phone]

## 2017-03-30 LAB — AEROBIC/ANAEROBIC CULTURE (SURGICAL/DEEP WOUND)

## 2017-03-30 LAB — AEROBIC/ANAEROBIC CULTURE W GRAM STAIN (SURGICAL/DEEP WOUND)

## 2017-03-31 LAB — CULTURE, BLOOD (ROUTINE X 2)
CULTURE: NO GROWTH
CULTURE: NO GROWTH
SPECIAL REQUESTS: ADEQUATE

## 2017-04-09 ENCOUNTER — Emergency Department (HOSPITAL_COMMUNITY)
Admission: EM | Admit: 2017-04-09 | Discharge: 2017-04-09 | Disposition: A | Discharge status: Home / Self Care | Payer: Medicare Other | Attending: Emergency Medicine | Admitting: Emergency Medicine

## 2017-04-09 ENCOUNTER — Encounter (HOSPITAL_COMMUNITY): Payer: Self-pay | Admitting: Emergency Medicine

## 2017-04-09 ENCOUNTER — Emergency Department (HOSPITAL_COMMUNITY): Payer: Medicare Other

## 2017-04-09 DIAGNOSIS — E119 Type 2 diabetes mellitus without complications: Secondary | ICD-10-CM | POA: Insufficient documentation

## 2017-04-09 DIAGNOSIS — I10 Essential (primary) hypertension: Secondary | ICD-10-CM | POA: Diagnosis not present

## 2017-04-09 DIAGNOSIS — F17200 Nicotine dependence, unspecified, uncomplicated: Secondary | ICD-10-CM | POA: Diagnosis not present

## 2017-04-09 DIAGNOSIS — K5900 Constipation, unspecified: Secondary | ICD-10-CM

## 2017-04-09 DIAGNOSIS — Z79899 Other long term (current) drug therapy: Secondary | ICD-10-CM | POA: Insufficient documentation

## 2017-04-09 DIAGNOSIS — R1084 Generalized abdominal pain: Secondary | ICD-10-CM | POA: Diagnosis not present

## 2017-04-09 DIAGNOSIS — F1910 Other psychoactive substance abuse, uncomplicated: Secondary | ICD-10-CM

## 2017-04-09 DIAGNOSIS — Z7984 Long term (current) use of oral hypoglycemic drugs: Secondary | ICD-10-CM | POA: Insufficient documentation

## 2017-04-09 LAB — COMPREHENSIVE METABOLIC PANEL
ALT: 15 U/L — ABNORMAL LOW (ref 17–63)
ANION GAP: 8 (ref 5–15)
AST: 17 U/L (ref 15–41)
Albumin: 3.7 g/dL (ref 3.5–5.0)
Alkaline Phosphatase: 85 U/L (ref 38–126)
BILIRUBIN TOTAL: 0.6 mg/dL (ref 0.3–1.2)
BUN: 22 mg/dL — ABNORMAL HIGH (ref 6–20)
CO2: 26 mmol/L (ref 22–32)
Calcium: 9.6 mg/dL (ref 8.9–10.3)
Chloride: 103 mmol/L (ref 101–111)
Creatinine, Ser: 1.57 mg/dL — ABNORMAL HIGH (ref 0.61–1.24)
GFR, EST AFRICAN AMERICAN: 51 mL/min — AB (ref >60)
GFR, EST NON AFRICAN AMERICAN: 44 mL/min — AB (ref >60)
Glucose, Bld: 120 mg/dL — ABNORMAL HIGH (ref 65–99)
POTASSIUM: 3.8 mmol/L (ref 3.5–5.1)
Sodium: 137 mmol/L (ref 135–145)
TOTAL PROTEIN: 7.5 g/dL (ref 6.5–8.1)

## 2017-04-09 LAB — CBC WITH DIFFERENTIAL/PLATELET
Basophils Absolute: 0.1 10*3/uL (ref 0.0–0.1)
Basophils Relative: 1 %
Eosinophils Absolute: 0.1 10*3/uL (ref 0.0–0.7)
Eosinophils Relative: 1 %
HEMATOCRIT: 45.7 % (ref 39.0–52.0)
Hemoglobin: 14.8 g/dL (ref 13.0–17.0)
LYMPHS PCT: 32 %
Lymphs Abs: 2.6 10*3/uL (ref 0.7–4.0)
MCH: 28.5 pg (ref 26.0–34.0)
MCHC: 32.4 g/dL (ref 30.0–36.0)
MCV: 87.9 fL (ref 78.0–100.0)
MONO ABS: 0.4 10*3/uL (ref 0.1–1.0)
MONOS PCT: 5 %
NEUTROS ABS: 5 10*3/uL (ref 1.7–7.7)
Neutrophils Relative %: 61 %
Platelets: 377 10*3/uL (ref 150–400)
RBC: 5.2 MIL/uL (ref 4.22–5.81)
RDW: 13.8 % (ref 11.5–15.5)
WBC: 8.1 10*3/uL (ref 4.0–10.5)

## 2017-04-09 LAB — URINALYSIS, ROUTINE W REFLEX MICROSCOPIC
Bilirubin Urine: NEGATIVE
GLUCOSE, UA: NEGATIVE mg/dL
Ketones, ur: NEGATIVE mg/dL
NITRITE: NEGATIVE
PROTEIN: NEGATIVE mg/dL
Specific Gravity, Urine: 1.02 (ref 1.005–1.030)
pH: 5 (ref 5.0–8.0)

## 2017-04-09 LAB — LIPASE, BLOOD: LIPASE: 25 U/L (ref 11–51)

## 2017-04-09 MED ORDER — LORAZEPAM 1 MG PO TABS
1.0000 mg | ORAL_TABLET | Freq: Once | ORAL | Status: AC
  Administered 2017-04-09: 1 mg via ORAL
  Filled 2017-04-09: qty 1

## 2017-04-09 MED ORDER — MORPHINE SULFATE (PF) 4 MG/ML IV SOLN
4.0000 mg | Freq: Once | INTRAVENOUS | Status: AC
  Administered 2017-04-09: 4 mg via INTRAVENOUS
  Filled 2017-04-09: qty 1

## 2017-04-09 MED ORDER — IOPAMIDOL (ISOVUE-300) INJECTION 61%
INTRAVENOUS | Status: AC
  Administered 2017-04-09: 100 mL
  Filled 2017-04-09: qty 100

## 2017-04-09 MED ORDER — SODIUM CHLORIDE 0.9 % IV BOLUS (SEPSIS)
1000.0000 mL | Freq: Once | INTRAVENOUS | Status: AC
Start: 2017-04-09 — End: 2017-04-09
  Administered 2017-04-09: 1000 mL via INTRAVENOUS

## 2017-04-09 NOTE — Discharge Instructions (Signed)
You were evaluated in the ED for constipation and abdominal pain. Your lab work was overall reassuring. Your CT scan was also reassuring. I suspect your constipation is from a chronic use of tramadol and other narcotic pain medications even prescribed. Your urine was sent for culture, you will be notified if you have a urinary tract infection at which point you will need antibiotics.  We will start a bowel regimen to help with constipation. Please take MiraLAX, stool softener and ensure you drink 2-3 L of water daily. Increase fiber intake and physical activity to help induce BMs.  Call your PCP today make an appointment within 1 week to ensure that your symptoms have improved.  Note: Your CT scan found 2 nodule on your adrenal glands. You need to speak to your PCP about this, and you will likely need a repeat CT scan to monitor the growth and determine the cause.

## 2017-04-09 NOTE — ED Provider Notes (Signed)
[facility] DEPT Provider Note   CSN: [id number] Arrival date & time: 04/09/17  7616     History   Chief Complaint Chief Complaint  Patient presents with  . Constipation    HPI JAFAR POFFENBERGER is a 66 y.o. male with history of non-insulin-dependent type 2 diabetes mellitus, chronic back pain, tobacco abuse, EtOH abuse, cocaine abuse presents to the ED with gradually worsening generalized abdominal pain associated with changing pain BMs and abdominal distention 1 month. Patient reports smaller caliber stools with straining, BM every 2-3 days, painless. 20 lb weight loss since February 2018, after wife passed. No previous history of constipation in the past. No associated fevers, night sweats, chills, nausea, vomiting, melena, hematochezia, rectal pain. No recent falls, saddle anesthesia, B/B incontinence. Never had a colonoscopy. Does not know if colon cancer in family. Since wife passed patient states he hasn't been eating as much, has stopped socializing and started drinking more ETOH and using cocaine.   Was seen in ED for abscess last week, had surgical I&D and prescribed oxycodone which he took as prescribed. Takes tramadol for chronic back pain. Does not check CBGs for DM.   Daily ETOH 6 pack or more. Cocaine often. Cigarettes 0.5 ppd   HPI  Past Medical History:  Diagnosis Date  . Abscess 02/2017   LEFT GLUTEAL   . Acid reflux   . DDD (degenerative disc disease), lumbar   . Diabetes mellitus without complication ([place])   . Enlarged prostate   . Hypertension   . Spinal stenosis, lumbar     Patient Active Problem List   Diagnosis Date Noted  . Abscess, gluteal, left 03/26/2017  . Type 2 diabetes mellitus ([place]) 03/26/2017  . Adrenal nodule ([place]) 03/26/2017  . Closed fracture of body of thoracic vertebra ([place]) 03/26/2017  . Spinal stenosis of lumbar region 03/26/2017    Past Surgical History:  Procedure Laterality Date  . BACK SURGERY     cervical  . INCISION AND  DRAINAGE PERIRECTAL ABSCESS N/A 03/26/2017   Procedure: IRRIGATION AND DEBRIDEMENT PERIRECTAL ABSCESS;  Surgeon: Rolm Bookbinder, MD;  Location: [facility];  Service: General;  Laterality: N/A;       Home Medications    Prior to Admission medications   Medication Sig Start Date End Date Taking? Authorizing Provider  clonazePAM (KLONOPIN) 1 MG tablet Take 1 mg by mouth 3 (three) times daily. 03/19/17  Yes [provider]  doxazosin (CARDURA) 8 MG tablet Take 4 mg by mouth at bedtime. Takes 1/2 tablet   Yes [provider]  metFORMIN (GLUCOPHAGE) 500 MG tablet Take 500 mg by mouth 2 (two) times daily with a meal.   Yes [provider]  ranitidine (ZANTAC) 150 MG tablet Take 150 mg by mouth 2 (two) times daily.   Yes [provider]  traMADol (ULTRAM) 50 MG tablet Take 50 mg by mouth every 6 (six) hours as needed. Pain   Yes [provider]  triamterene-hydrochlorothiazide (MAXZIDE) 75-50 MG per tablet Take 1 tablet by mouth daily.   Yes [provider]  amoxicillin-clavulanate (AUGMENTIN) 875-125 MG tablet Take 1 tablet by mouth every 12 (twelve) hours. Patient not taking: Reported on 04/09/2017 03/28/17   Fanny Skates, MD  HYDROcodone-acetaminophen (NORCO/VICODIN) 5-325 MG tablet Take 1-2 tablets by mouth every 6 (six) hours as needed for moderate pain. Patient not taking: Reported on 03/26/2017 02/25/17   Fredia Sorrow, MD  methocarbamol (ROBAXIN) 500 MG tablet Take 1 tablet (500 mg total) by mouth 2 (  two) times daily. Patient not taking: Reported on 03/26/2017 08/03/14   Carlisle Cater, PA-C  oxyCODONE-acetaminophen (PERCOCET/ROXICET) 5-325 MG per tablet Take 1-2 tablets by mouth every 6 (six) hours as needed for severe pain. Patient not taking: Reported on 03/26/2017 08/03/14   Carlisle Cater, PA-C  oxyCODONE-acetaminophen (PERCOCET/ROXICET) 5-325 MG tablet Take 1 tablet by mouth every 4 (four) hours as needed for moderate pain or severe  pain. Patient not taking: Reported on 04/09/2017 03/28/17   Fanny Skates, MD    Family History Family History  Problem Relation Age of Onset  . Hypertension Mother   . Cancer - Lung Father     Social History Social History  Substance Use Topics  . Smoking status: Current Every Day Smoker    Packs/day: 0.50  . Smokeless tobacco: Never Used  . Alcohol use 7.2 oz/week    12 Cans of beer per week     Allergies   Patient has no known allergies.   Review of Systems Review of Systems  Constitutional: Positive for unexpected weight change. Negative for diaphoresis and fever.  HENT: Negative for congestion and sore throat.   Respiratory: Negative for cough and shortness of breath.   Cardiovascular: Negative for chest pain.  Gastrointestinal: Positive for abdominal distention, abdominal pain and constipation. Negative for anal bleeding, blood in stool, diarrhea, nausea, rectal pain and vomiting.  Endocrine: Negative for polydipsia and polyuria.  Genitourinary: Negative for difficulty urinating, dysuria and hematuria.  Musculoskeletal: Negative for back pain and myalgias.  Skin: Negative for color change.  Allergic/Immunologic: Positive for immunocompromised state.  Neurological: Negative for headaches.     Physical Exam Updated Vital Signs BP 133/85   Pulse 72   Temp (!) 97.5 F (36.4 C) (Oral)   Resp 19   Ht 6\' 2"  (1.88 m)   Wt 90.7 kg (200 lb)   SpO2 93%   BMI 25.68 kg/m   Physical Exam  Constitutional: He is oriented to person, place, and time. He appears well-developed and well-nourished. No distress.  HENT:  Head: Normocephalic and atraumatic.  Nose: Nose normal.  Mouth/Throat: No oropharyngeal exudate.  Mildly dry mucous membranes   Eyes: Pupils are equal, round, and reactive to light. Conjunctivae and EOM are normal.  Neck: Normal range of motion. Neck supple.  Cardiovascular: Regular rhythm, normal heart sounds and intact distal pulses.  Tachycardia  present.   No murmur heard. HR 110-120  Pulmonary/Chest: Effort normal and breath sounds normal. No respiratory distress. He has no wheezes. He has no rales.  Abdominal: Soft. Bowel sounds are normal. He exhibits no distension. There is generalized tenderness.    No suprapubic or CVA tenderness  No obvious distention  No hernias  Musculoskeletal: Normal range of motion. He exhibits no deformity.  Lymphadenopathy:    He has no cervical adenopathy.  Neurological: He is alert and oriented to person, place, and time.  Skin: Skin is warm and dry. Capillary refill takes less than 2 seconds.  Psychiatric: He has a normal mood and affect. His behavior is normal. Judgment and thought content normal.  Nursing note and vitals reviewed.    ED Treatments / Results  Labs (all labs ordered are listed, but only abnormal results are displayed) Labs Reviewed  COMPREHENSIVE METABOLIC PANEL - Abnormal; Notable for the following:       Result Value   Glucose, Bld 120 (*)    BUN 22 (*)    Creatinine, Ser 1.57 (*)    ALT 15 (*)  GFR calc non Af Amer 44 (*)    GFR calc Af Amer 51 (*)    All other components within normal limits  URINALYSIS, ROUTINE W REFLEX MICROSCOPIC - Abnormal; Notable for the following:    APPearance HAZY (*)    Hgb urine dipstick SMALL (*)    Leukocytes, UA LARGE (*)    Bacteria, UA RARE (*)    Squamous Epithelial / LPF 0-5 (*)    All other components within normal limits  URINE CULTURE  CBC WITH DIFFERENTIAL/PLATELET  LIPASE, BLOOD    EKG  EKG Interpretation  Date/Time:  Thursday April 09 2017 07:23:08 EDT Ventricular Rate:  88 PR Interval:    QRS Duration: 80 QT Interval:  352 QTC Calculation: 426 R Axis:   40 Text Interpretation:  Normal sinus rhythm Baseline wander in lead(s) V1 Reconfirmed by Tanna Furry [phone]) on 04/09/2017 8:00:41 AM       Radiology Ct Abdomen Pelvis W Contrast  Result Date: 04/09/2017 CLINICAL DATA:  Patient with constipation for  multiple weeks. EXAM: CT ABDOMEN AND PELVIS WITH CONTRAST TECHNIQUE: Multidetector CT imaging of the abdomen and pelvis was performed using the standard protocol following bolus administration of intravenous contrast. CONTRAST:  171mL ISOVUE-300 IOPAMIDOL (ISOVUE-300) INJECTION 61% COMPARISON:  CT abdomen pelvis 03/26/2017. FINDINGS: Lower chest: Normal heart size. Coronary arterial vascular calcifications. Dependent atelectasis within the bilateral lower lobes. No pleural effusion. Hepatobiliary: Liver is normal in size and contour. No focal lesion is identified. Gallbladder is unremarkable. Pancreas: Unremarkable Spleen: Unremarkable Adrenals/Urinary Tract: Unchanged 2.5 cm left adrenal nodule and 2.1 cm right adrenal nodule, indeterminate. Kidneys enhance symmetrically with contrast. No hydronephrosis. Urinary bladder is unremarkable. Stomach/Bowel: Stool throughout the colon. No abnormal bowel wall thickening or evidence for bowel obstruction. No free fluid or free intraperitoneal air. Normal morphology of the stomach. Vascular/Lymphatic: Normal caliber abdominal aorta. Peripheral calcified atherosclerotic plaque. No retroperitoneal lymphadenopathy. Reproductive: Median lobe prostatic hypertrophy. Other: Interval decrease in subcutaneous fat stranding involving the left gluteal cleft (image 98; series 3). Musculoskeletal: Stable probable bone island left ilium. Lumbar spine degenerative changes. Unchanged L2 compression deformity. Similar-appearing superior endplate compression deformity of the T12 vertebral body where there is approximately 30% height loss. IMPRESSION: Re- demonstrated superior endplate compression deformity of the T12 vertebral body. Recommend correlation for point tenderness. Interval decrease in inflammatory stranding involving the left gluteal cleft. Stool throughout the colon as can be seen with constipation. Persistent bilateral indeterminate adrenal nodules. Consider follow-up CT  Electronically Signed   By: Lovey Newcomer M.D.   On: 04/09/2017 11:56    Procedures Procedures (including critical care time)  Medications Ordered in ED Medications  morphine 4 MG/ML injection 4 mg (4 mg Intravenous Given 04/09/17 0701)  sodium chloride 0.9 % bolus 1,000 mL (0 mLs Intravenous Stopped 04/09/17 0855)  LORazepam (ATIVAN) tablet 1 mg (1 mg Oral Given 04/09/17 0855)  iopamidol (ISOVUE-300) 61 % injection (100 mLs  Contrast Given 04/09/17 1131)     Initial Impression / Assessment and Plan / ED Course  I have reviewed the triage vital signs and the nursing notes.  Pertinent labs & imaging results that were available during my care of the patient were reviewed by me and considered in my medical decision making (see chart for details).  Clinical Course as of Apr 09 1328  Thu Apr 09, 2017  0713 Leukocytes, UA: (!) LARGE [CG]  0714 WBC, UA: TOO NUMEROUS TO COUNT [CG]  0751 Creatinine: (!) 1.57 [CG]  0751 GFR, Est  African American: (!) 51 [CG]  0754 HR 88 Normal sinus rhythm Probable anteroseptal infarct, recent Baseline wander in lead(s) V1 Reconfirmed by Tanna Furry [phone]) on 04/09/2017 7:31:20 AM EKG 12-Lead [CG]  1243 IMPRESSION: Re- demonstrated superior endplate compression deformity of the T12 vertebral body. Recommend correlation for point tenderness.  Interval decrease in inflammatory stranding involving the left gluteal cleft.  Stool throughout the colon as can be seen with constipation.  Persistent bilateral indeterminate adrenal nodules. Considerfollow-up CT  [CG]    Clinical Course User Index [CG] Kinnie Feil, PA-C   66 year old male with history of non-insulin-dependent type 2 diabetes mellitus and chronic back pain on terminal presents to the ED for evaluation of constipation 1 month. No previous history of the same. Associated with generalized abdominal discomfort. 20 pound weight loss since 11-28-22, after his wife passed away. Has never had a  colonoscopy. Generalized abdominal tenderness on exam, dry mucous membranes, patient is tachycardic 110-120. Normotensive. Given age, chronicity of symptoms will get screening labs and likely CT.  ED lab work and imaging overall reassuring. Creatinine slightly elevated at 1.57, not significantly elevated from previous. CBC without leukocytosis or anemia. Lipase negative. U/A with large leukocytes, TNTC WBCs and rare bacteria. Patient does not have urinary symptoms. CT a/p scan also reassuring. Will send culture but hold antibiotics for UTI today as there is no fever or urinary symptoms.   Final Clinical Impressions(s) / ED Diagnoses   Final diagnoses:  Constipation in male  Polysubstance abuse   66 year old male presents to the ED for evaluation of constipation and generalized abdominal discomfort. ED lab work and CT scan overall reassuring. Urinalysis with possible UTI, however has no urinary symptoms or fever. We'll send culture to confirm, we'll hold off antibiotics at this time. Patient is considered safe for discharge with bowel regimen to help with constipation. Polysubstance abuse resources given. Patient is aware of persistent bilateral adrenal nodules found on CT scan, he was advised to follow-up with PCP for monitoring of these nodules. Strict ED return precautions given. Patient verbalized understanding and is agreeable with plan.  New Prescriptions New Prescriptions   No medications on file     Arlean Hopping 04/09/17 1329    Dina Rich Barbette Hair, MD 04/10/17 [phone]

## 2017-04-09 NOTE — ED Notes (Signed)
Pt presents today with a complaint of constipation. Pt states he went to the bathroom on 04/08/17 but it was small and stringy. Pt states he was here 2 weeks ago for the same thing.

## 2017-04-09 NOTE — ED Notes (Signed)
Pt reports he has been constipated "for months" and is very uncomfortable. He reports he was in the hospital for an abscess but they "did nothing" for my constipation.

## 2017-04-11 LAB — URINE CULTURE

## 2017-04-12 ENCOUNTER — Telehealth: Payer: Self-pay

## 2017-04-12 NOTE — Progress Notes (Signed)
ED Antimicrobial Stewardship Positive Culture Follow Up   Tim Bradley is an 66 y.o. male who presented to [facility] on 04/09/2017 with a chief complaint of constipation. Chief Complaint  Patient presents with  . Constipation    Recent Results (from the past 720 hour(s))  Culture, blood (Routine x 2)     Status: None   Collection Time: 03/26/17 12:54 AM  Result Value Ref Range Status   Specimen Description BLOOD LEFT ARM  Final   Special Requests   Final    BOTTLES DRAWN AEROBIC AND ANAEROBIC Blood Culture results may not be optimal due to an inadequate volume of blood received in culture bottles   Culture NO GROWTH 5 DAYS  Final   Report Status 03/31/2017 FINAL  Final  Culture, blood (Routine x 2)     Status: None   Collection Time: 03/26/17  1:10 AM  Result Value Ref Range Status   Specimen Description BLOOD RIGHT ARM  Final   Special Requests IN PEDIATRIC BOTTLE Blood Culture adequate volume  Final   Culture NO GROWTH 5 DAYS  Final   Report Status 03/31/2017 FINAL  Final  Urine culture     Status: Abnormal   Collection Time: 03/26/17  1:23 AM  Result Value Ref Range Status   Specimen Description URINE, RANDOM  Final   Special Requests NONE  Final   Culture MULTIPLE SPECIES PRESENT, SUGGEST RECOLLECTION (A)  Final   Report Status 03/27/2017 FINAL  Final  Surgical pcr screen     Status: None   Collection Time: 03/26/17  9:50 AM  Result Value Ref Range Status   MRSA, PCR NEGATIVE NEGATIVE Final   Staphylococcus aureus NEGATIVE NEGATIVE Final    Comment:        The Xpert SA Assay (FDA approved for NASAL specimens in patients over 47 years of age), is one component of a comprehensive surveillance program.  Test performance has been validated by [facility] [place] for patients greater than or equal to 50 year old. It is not intended to diagnose infection nor to guide or monitor treatment.   Aerobic/Anaerobic Culture (surgical/deep wound)     Status: Abnormal   Collection  Time: 03/26/17 10:36 AM  Result Value Ref Range Status   Specimen Description ABSCESS LEFT  Final   Special Requests GLUTEAL  Final   Gram Stain   Final    ABUNDANT WBC PRESENT, PREDOMINANTLY PMN ABUNDANT GRAM POSITIVE COCCI IN PAIRS ABUNDANT GRAM NEGATIVE COCCOBACILLI    Culture (A)  Final    MULTIPLE ORGANISMS PRESENT, NONE PREDOMINANT MIXED ANAEROBIC FLORA PRESENT.  CALL LAB IF FURTHER IID REQUIRED.    Report Status 03/30/2017 FINAL  Final  Urine culture     Status: Abnormal   Collection Time: 04/09/17  6:41 AM  Result Value Ref Range Status   Specimen Description URINE, RANDOM  Final   Special Requests NONE  Final   Culture 70,000 COLONIES/mL ESCHERICHIA COLI (A)  Final   Report Status 04/11/2017 FINAL  Final   Organism ID, Bacteria ESCHERICHIA COLI (A)  Final      Susceptibility   Escherichia coli - MIC*    AMPICILLIN >=32 RESISTANT Resistant     CEFAZOLIN <=4 SENSITIVE Sensitive     CEFTRIAXONE <=1 SENSITIVE Sensitive     CIPROFLOXACIN <=0.25 SENSITIVE Sensitive     GENTAMICIN >=16 RESISTANT Resistant     IMIPENEM <=0.25 SENSITIVE Sensitive     NITROFURANTOIN <=16 SENSITIVE Sensitive     TRIMETH/SULFA >=320  RESISTANT Resistant     AMPICILLIN/SULBACTAM >=32 RESISTANT Resistant     PIP/TAZO <=4 SENSITIVE Sensitive     Extended ESBL NEGATIVE Sensitive     * 70,000 COLONIES/mL ESCHERICHIA COLI   Call patient.  If urinary symptoms, cephalexin 500 mg BID x 7 days. If no symptoms, no antibiotic.   ED Provider: [place], PharmD 04/12/2017, 9:38 AM PGY2 Infectious Diseases Pharmacy Resident Phone# [phone]

## 2017-04-12 NOTE — Telephone Encounter (Signed)
Called for Urinary symptom check. Pt states no further problems Encouraged to return is any urinary problems

## 2017-11-27 ENCOUNTER — Encounter (HOSPITAL_COMMUNITY): Payer: Self-pay

## 2017-11-27 ENCOUNTER — Emergency Department (HOSPITAL_COMMUNITY)
Admission: EM | Admit: 2017-11-27 | Discharge: 2017-11-28 | Disposition: A | Discharge status: Home / Self Care | Payer: Medicare Other | Attending: Emergency Medicine | Admitting: Emergency Medicine

## 2017-11-27 ENCOUNTER — Other Ambulatory Visit: Payer: Self-pay

## 2017-11-27 DIAGNOSIS — Y998 Other external cause status: Secondary | ICD-10-CM | POA: Diagnosis not present

## 2017-11-27 DIAGNOSIS — Y33XXXA Other specified events, undetermined intent, initial encounter: Secondary | ICD-10-CM | POA: Diagnosis not present

## 2017-11-27 DIAGNOSIS — S22080A Wedge compression fracture of T11-T12 vertebra, initial encounter for closed fracture: Secondary | ICD-10-CM | POA: Diagnosis not present

## 2017-11-27 DIAGNOSIS — Z7984 Long term (current) use of oral hypoglycemic drugs: Secondary | ICD-10-CM | POA: Diagnosis not present

## 2017-11-27 DIAGNOSIS — Z79899 Other long term (current) drug therapy: Secondary | ICD-10-CM | POA: Diagnosis not present

## 2017-11-27 DIAGNOSIS — E119 Type 2 diabetes mellitus without complications: Secondary | ICD-10-CM | POA: Insufficient documentation

## 2017-11-27 DIAGNOSIS — Y929 Unspecified place or not applicable: Secondary | ICD-10-CM | POA: Diagnosis not present

## 2017-11-27 DIAGNOSIS — M5416 Radiculopathy, lumbar region: Secondary | ICD-10-CM | POA: Insufficient documentation

## 2017-11-27 DIAGNOSIS — Y939 Activity, unspecified: Secondary | ICD-10-CM | POA: Insufficient documentation

## 2017-11-27 DIAGNOSIS — F17200 Nicotine dependence, unspecified, uncomplicated: Secondary | ICD-10-CM | POA: Diagnosis not present

## 2017-11-27 NOTE — ED Provider Notes (Signed)
[facility] DEPT Provider Note   CSN: [id number] Arrival date & time: 11/27/17  1713     History   Chief Complaint Chief Complaint  Patient presents with  . Fall  . abnormal MRI    from [place] in [place] is a 67 y.o. male.  HPI Patient with no recent trauma.  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  Had routine MRI performed today.  Was called back due to concern for vertebral fracture and spinal cord compromise.  Patient states he has ongoing left leg numbness and believes he has some atrophy to his left quadricep.  Denies any new low back pain.  States he has chronic urinary frequency.  This is unchanged.  Past Medical History:  Diagnosis Date  . Abscess 02/2017   LEFT GLUTEAL   . Acid reflux   . DDD (degenerative disc disease), lumbar   . Diabetes mellitus without complication ([place])   . Enlarged prostate   . Hypertension   . Spinal stenosis, lumbar     Patient Active Problem List   Diagnosis Date Noted  . Abscess, gluteal, left 03/26/2017  . Type 2 diabetes mellitus ([place]) 03/26/2017  . Adrenal nodule ([place]) 03/26/2017  . Closed fracture of body of thoracic vertebra ([place]) 03/26/2017  . Spinal stenosis of lumbar region 03/26/2017    Past Surgical History:  Procedure Laterality Date  . BACK SURGERY     cervical  . INCISION AND DRAINAGE PERIRECTAL ABSCESS N/A 03/26/2017   Procedure: IRRIGATION AND DEBRIDEMENT PERIRECTAL ABSCESS;  Surgeon: Rolm Bookbinder, MD;  Location: [facility];  Service: General;  Laterality: N/A;       Home Medications    Prior to Admission medications   Medication Sig Start Date End Date Taking? Authorizing Provider  clonazePAM (KLONOPIN) 1 MG tablet Take 1 mg by mouth 3 (three) times daily. 03/19/17  Yes [provider]  gabapentin (NEURONTIN) 300 MG capsule Take 300 mg by mouth 3 (three) times daily. 10/13/17  Yes [provider]  metFORMIN (GLUCOPHAGE)  500 MG tablet Take 500 mg by mouth 2 (two) times daily with a meal.   Yes [provider]  ranitidine (ZANTAC) 150 MG tablet Take 150 mg by mouth daily as needed for heartburn.    Yes [provider]  triamterene-hydrochlorothiazide (MAXZIDE) 75-50 MG per tablet Take 1 tablet by mouth daily.   Yes [provider]  amoxicillin-clavulanate (AUGMENTIN) 875-125 MG tablet Take 1 tablet by mouth every 12 (twelve) hours. Patient not taking: Reported on 11/27/2017 03/28/17   Fanny Skates, MD  HYDROcodone-acetaminophen (NORCO/VICODIN) 5-325 MG tablet Take 1-2 tablets by mouth every 6 (six) hours as needed for moderate pain. Patient not taking: Reported on 03/26/2017 02/25/17   Fredia Sorrow, MD  methocarbamol (ROBAXIN) 500 MG tablet Take 1 tablet (500 mg total) by mouth 2 (two) times daily. Patient not taking: Reported on 03/26/2017 08/03/14   Carlisle Cater, PA-C  oxyCODONE-acetaminophen (PERCOCET/ROXICET) 5-325 MG per tablet Take 1-2 tablets by mouth every 6 (six) hours as needed for severe pain. Patient not taking: Reported on 03/26/2017 08/03/14   Carlisle Cater, PA-C  oxyCODONE-acetaminophen (PERCOCET/ROXICET) 5-325 MG tablet Take 1 tablet by mouth every 4 (four) hours as needed for moderate pain or severe pain. Patient not taking: Reported on 04/09/2017 03/28/17   Fanny Skates, MD    Family History Family History  Problem Relation Age of Onset  . Hypertension Mother   . Cancer -  Lung Father     Social History Social History   Tobacco Use  . Smoking status: Current Every Day Smoker    Packs/day: 0.50  . Smokeless tobacco: Never Used  Substance Use Topics  . Alcohol use: Yes    Alcohol/week: 7.2 oz    Types: 12 Cans of beer per week  . Drug use: Yes    Types: Cocaine, Marijuana     Allergies   Patient has no known allergies.   Review of Systems Review of Systems  Constitutional: Negative for chills and fever.  Respiratory: Negative for cough, chest  tightness, shortness of breath and wheezing.   Cardiovascular: Negative for chest pain.  Gastrointestinal: Negative for abdominal pain, constipation, diarrhea, nausea and vomiting.  Genitourinary: Positive for frequency. Negative for difficulty urinating, flank pain and hematuria.  Musculoskeletal: Positive for back pain, gait problem and myalgias. Negative for arthralgias, neck pain and neck stiffness.  Skin: Negative for rash and wound.  Neurological: Positive for numbness. Negative for dizziness, weakness, light-headedness and headaches.  All other systems reviewed and are negative.    Physical Exam Updated Vital Signs BP (!) 158/97 (BP Location: Right Arm)   Pulse (!) 109   Temp 98.3 F (36.8 C) (Oral)   Resp 20   Ht 6\' 2"  (1.88 m)   Wt 102.1 kg (225 lb)   SpO2 94%   BMI 28.89 kg/m   Physical Exam  Constitutional: He is oriented to person, place, and time. He appears well-developed and well-nourished. No distress.  HENT:  Head: Normocephalic and atraumatic.  Mouth/Throat: Oropharynx is clear and moist. No oropharyngeal exudate.  Eyes: EOM are normal. Pupils are equal, round, and reactive to light.  Neck: Normal range of motion. Neck supple.  Cardiovascular: Normal rate and regular rhythm. Exam reveals no gallop and no friction rub.  No murmur heard. Pulmonary/Chest: Effort normal and breath sounds normal. No stridor. No respiratory distress. He has no wheezes. He has no rales. He exhibits no tenderness.  Abdominal: Soft. Bowel sounds are normal. There is no tenderness. There is no rebound and no guarding.  Musculoskeletal: Normal range of motion. He exhibits tenderness. He exhibits no edema.  No definite midline thoracic or lumbar tenderness.  Patient does have some left-sided lumbar paraspinal tenderness.  Question left mild left quadricep atrophy compared to right.  Distal pulses are 2+.  Neurological: He is alert and oriented to person, place, and time.  5/5 motor  bilateral lower extremities.  Patient does have some decreased sensation to the left foot greater than right.  No saddle anesthesia.  Skin: Skin is warm and dry. Capillary refill takes less than 2 seconds. No rash noted. He is not diaphoretic. No erythema.  Psychiatric: He has a normal mood and affect. His behavior is normal.  Nursing note and vitals reviewed.    ED Treatments / Results  Labs (all labs ordered are listed, but only abnormal results are displayed) Labs Reviewed - No data to display  EKG  EKG Interpretation None       Radiology No results found.  Procedures Procedures (including critical care time)  Medications Ordered in ED Medications - No data to display   Initial Impression / Assessment and Plan / ED Course  I have reviewed the triage vital signs and the nursing notes.  Pertinent labs & imaging results that were available during my care of the patient were reviewed by me and considered in my medical decision making (see chart for details).  We will attempt to get MRI results from [place].  Patient does not appear to have any acute symptoms  Obtain results from MRI today.  Patient has a new acute/subacute T12 compression fracture with mild spinal canal stenosis at T11-12.  Been recommended repeat MRI of the lumbar spine in 6 months.  Patient also has multilevel lumbar severe spinal canal and neuroforaminal stenosis due to degenerative disc disease.  No definite acute changes.  Given ongoing nature of the patient's symptoms low suspicion for any acute neurologic changes.  He will need to follow-up with spinal surgery.  He was given extensive return precautions and has voiced understanding. Final Clinical Impressions(s) / ED Diagnoses   Final diagnoses:  Compression fracture of T12 vertebra [facility])  Lumbar radiculopathy, chronic    ED Discharge Orders    None       Julianne Rice, MD 11/28/17 0005

## 2017-11-27 NOTE — ED Notes (Addendum)
Patient's sister Roena Malady, [phone]

## 2017-11-27 NOTE — ED Triage Notes (Signed)
Patient went to his [facility] appointment today. Patient states he had an MRI today and was called by the [place] and told to get to the ED immediately because he fracture and a bulging disc ws pushing into the spinal cord and it could cause paralysis.

## 2017-11-27 NOTE — ED Notes (Signed)
Bed: WA09 Expected date:  Expected time:  Means of arrival:  Comments: Triage 3

## 2017-11-28 NOTE — ED Notes (Signed)
Patient unable to sign.  

## 2017-11-28 NOTE — ED Notes (Signed)
[name], Roena Malady, was contacted at this time and was made aware of pt's discharge---- pt's sister will be here at around 0930 to pick him up.

## 2017-12-07 ENCOUNTER — Other Ambulatory Visit: Payer: Self-pay | Admitting: Neurosurgery

## 2017-12-07 DIAGNOSIS — G959 Disease of spinal cord, unspecified: Secondary | ICD-10-CM

## 2017-12-18 ENCOUNTER — Ambulatory Visit
Admission: RE | Admit: 2017-12-18 | Discharge: 2017-12-18 | Disposition: A | Discharge status: Home / Self Care | Payer: Medicare Other | Source: Ambulatory Visit | Attending: Neurosurgery | Admitting: Neurosurgery

## 2017-12-18 DIAGNOSIS — G959 Disease of spinal cord, unspecified: Secondary | ICD-10-CM

## 2017-12-28 ENCOUNTER — Other Ambulatory Visit: Payer: Self-pay | Admitting: Neurological Surgery

## 2017-12-28 DIAGNOSIS — M48061 Spinal stenosis, lumbar region without neurogenic claudication: Secondary | ICD-10-CM

## 2018-01-07 ENCOUNTER — Other Ambulatory Visit: Payer: Medicare Other

## 2018-01-14 ENCOUNTER — Ambulatory Visit
Admission: RE | Admit: 2018-01-14 | Discharge: 2018-01-14 | Disposition: A | Discharge status: Home / Self Care | Payer: Medicare Other | Source: Ambulatory Visit | Attending: Neurological Surgery | Admitting: Neurological Surgery

## 2018-01-14 DIAGNOSIS — M48061 Spinal stenosis, lumbar region without neurogenic claudication: Secondary | ICD-10-CM

## 2018-01-23 ENCOUNTER — Other Ambulatory Visit: Payer: Medicare Other

## 2018-04-23 ENCOUNTER — Other Ambulatory Visit: Payer: Self-pay | Admitting: Neurological Surgery

## 2018-04-27 NOTE — Pre-Procedure Instructions (Signed)
Tim Bradley  04/27/2018      [id number] [place], [place] - [address] [place] [address] Phone: [phone] Fax: [phone]  [id number] [place], [place] [address] [place] [address] Phone: [phone] Fax: [phone]  [place], [place] - [address] [place]  [address] Phone: [phone] Fax: [phone]    Your procedure is scheduled on August 5  Report to [facility] at 1015 A.M.  Call this number if you have problems the morning of surgery:  [phone]   Remember:  Do not eat or drink after midnight.    Take these medicines the morning of surgery with A SIP OF WATER  clonazePAM (KLONOPIN)  gabapentin (NEURONTIN)  ranitidine (ZANTAC)  7 days prior to surgery STOP taking any Aspirin(unless otherwise instructed by your surgeon), Aleve, Naproxen, Ibuprofen, Motrin, Advil, Goody's, BC's, all herbal medications, fish oil, and all vitamins   WHAT DO I DO ABOUT MY DIABETES MEDICATION?   Marland Kitchen Do not take oral diabetes medicines (pills) the morning of surgery. metFORMIN (GLUCOPHAGE   How to Manage Your Diabetes Before and After Surgery  Why is it important to control my blood sugar before and after surgery? . Improving blood sugar levels before and after surgery helps healing and can limit problems. . A way of improving blood sugar control is eating a healthy diet by: o  Eating less sugar and carbohydrates o  Increasing activity/exercise o  Talking with your doctor about reaching your blood sugar goals . High blood sugars (greater than 180 mg/dL) can raise your risk of infections and slow your recovery, so you will need to focus on controlling your diabetes during the weeks before surgery. . Make sure that the doctor who takes  care of your diabetes knows about your planned surgery including the date and location.  How do I manage my blood sugar before surgery? . Check your blood sugar at least 4 times a day, starting 2 days before surgery, to make sure that the level is not too high or low. o Check your blood sugar the morning of your surgery when you wake up and every 2 hours until you get to the Short Stay unit. . If your blood sugar is less than 70 mg/dL, you will need to treat for low blood sugar: o Do not take insulin. o Treat a low blood sugar (less than 70 mg/dL) with  cup of clear juice (cranberry or apple), 4 glucose tablets, OR glucose gel. o Recheck blood sugar in 15 minutes after treatment (to make sure it is greater than 70 mg/dL). If your blood sugar is not greater than 70 mg/dL on recheck, call [phone] for further instructions. . Report your blood sugar to the short stay nurse when you get to Short Stay.  . If you are admitted to the hospital after surgery: o Your blood sugar will be checked by the staff and you will probably be given insulin after surgery (instead of oral diabetes medicines) to make sure you have good blood sugar levels. o The goal for blood sugar control after surgery is 80-180 mg/dL.     Do not wear jewelry  Do not wear lotions, powders, or cologne, or deodorant.  Men may shave face and neck.  Do not bring valuables to  the hospital.  [facility] is not responsible for any belongings or valuables.  Contacts, dentures or bridgework may not be worn into surgery.  Leave your suitcase in the car.  After surgery it may be brought to your room.  For patients admitted to the hospital, discharge time will be determined by your treatment team.  Patients discharged the day of surgery will not be allowed to drive home.    Special instructions:   - Preparing For Surgery  Before surgery, you can play an important role. Because skin is not sterile, your skin needs to be  as free of germs as possible. You can reduce the number of germs on your skin by washing with CHG (chlorahexidine gluconate) Soap before surgery.  CHG is an antiseptic cleaner which kills germs and bonds with the skin to continue killing germs even after washing.    Oral Hygiene is also important to reduce your risk of infection.  Remember - BRUSH YOUR TEETH THE MORNING OF SURGERY WITH YOUR REGULAR TOOTHPASTE  Please do not use if you have an allergy to CHG or antibacterial soaps. If your skin becomes reddened/irritated stop using the CHG.  Do not shave (including legs and underarms) for at least 48 hours prior to first CHG shower. It is OK to shave your face.  Please follow these instructions carefully.   1. Shower the NIGHT BEFORE SURGERY and the MORNING OF SURGERY with CHG.   2. If you chose to wash your hair, wash your hair first as usual with your normal shampoo.  3. After you shampoo, rinse your hair and body thoroughly to remove the shampoo.  4. Use CHG as you would any other liquid soap. You can apply CHG directly to the skin and wash gently with a scrungie or a clean washcloth.   5. Apply the CHG Soap to your body ONLY FROM THE NECK DOWN.  Do not use on open wounds or open sores. Avoid contact with your eyes, ears, mouth and genitals (private parts). Wash Face and genitals (private parts)  with your normal soap.  6. Wash thoroughly, paying special attention to the area where your surgery will be performed.  7. Thoroughly rinse your body with warm water from the neck down.  8. DO NOT shower/wash with your normal soap after using and rinsing off the CHG Soap.  9. Pat yourself dry with a CLEAN TOWEL.  10. Wear CLEAN PAJAMAS to bed the night before surgery, wear comfortable clothes the morning of surgery  11. Place CLEAN SHEETS on your bed the night of your first shower and DO NOT SLEEP WITH PETS.    Day of Surgery:  Do not apply any deodorants/lotions.  Please wear clean  clothes to the hospital/surgery center.   Remember to brush your teeth WITH YOUR REGULAR TOOTHPASTE.    Please read over the following fact sheets that you were given.

## 2018-04-28 ENCOUNTER — Encounter (HOSPITAL_COMMUNITY): Payer: Self-pay

## 2018-04-28 ENCOUNTER — Encounter (HOSPITAL_COMMUNITY)
Admission: RE | Admit: 2018-04-28 | Discharge: 2018-04-28 | Disposition: A | Discharge status: Home / Self Care | Payer: Medicare Other | Source: Ambulatory Visit | Attending: Neurological Surgery | Admitting: Neurological Surgery

## 2018-04-28 ENCOUNTER — Other Ambulatory Visit: Payer: Self-pay

## 2018-04-28 DIAGNOSIS — Z01810 Encounter for preprocedural cardiovascular examination: Secondary | ICD-10-CM | POA: Diagnosis not present

## 2018-04-28 DIAGNOSIS — K219 Gastro-esophageal reflux disease without esophagitis: Secondary | ICD-10-CM | POA: Diagnosis not present

## 2018-04-28 DIAGNOSIS — I10 Essential (primary) hypertension: Secondary | ICD-10-CM | POA: Diagnosis not present

## 2018-04-28 DIAGNOSIS — Z7984 Long term (current) use of oral hypoglycemic drugs: Secondary | ICD-10-CM | POA: Diagnosis not present

## 2018-04-28 DIAGNOSIS — F17200 Nicotine dependence, unspecified, uncomplicated: Secondary | ICD-10-CM | POA: Diagnosis not present

## 2018-04-28 DIAGNOSIS — E119 Type 2 diabetes mellitus without complications: Secondary | ICD-10-CM | POA: Diagnosis not present

## 2018-04-28 DIAGNOSIS — Z79899 Other long term (current) drug therapy: Secondary | ICD-10-CM | POA: Insufficient documentation

## 2018-04-28 DIAGNOSIS — Z01812 Encounter for preprocedural laboratory examination: Secondary | ICD-10-CM | POA: Diagnosis not present

## 2018-04-28 DIAGNOSIS — G9589 Other specified diseases of spinal cord: Secondary | ICD-10-CM | POA: Diagnosis not present

## 2018-04-28 LAB — COMPREHENSIVE METABOLIC PANEL
ALK PHOS: 53 U/L (ref 38–126)
ALT: 18 U/L (ref 0–44)
ANION GAP: 7 (ref 5–15)
AST: 20 U/L (ref 15–41)
Albumin: 3.1 g/dL — ABNORMAL LOW (ref 3.5–5.0)
BILIRUBIN TOTAL: 0.9 mg/dL (ref 0.3–1.2)
BUN: 21 mg/dL (ref 8–23)
CALCIUM: 9 mg/dL (ref 8.9–10.3)
CO2: 24 mmol/L (ref 22–32)
Chloride: 107 mmol/L (ref 98–111)
Creatinine, Ser: 1.57 mg/dL — ABNORMAL HIGH (ref 0.61–1.24)
GFR calc non Af Amer: 43 mL/min — ABNORMAL LOW (ref >60)
GFR, EST AFRICAN AMERICAN: 50 mL/min — AB (ref >60)
Glucose, Bld: 114 mg/dL — ABNORMAL HIGH (ref 70–99)
Potassium: 4.2 mmol/L (ref 3.5–5.1)
SODIUM: 138 mmol/L (ref 135–145)
TOTAL PROTEIN: 6.5 g/dL (ref 6.5–8.1)

## 2018-04-28 LAB — CBC
HEMATOCRIT: 46.1 % (ref 39.0–52.0)
HEMOGLOBIN: 14.4 g/dL (ref 13.0–17.0)
MCH: 29.5 pg (ref 26.0–34.0)
MCHC: 31.2 g/dL (ref 30.0–36.0)
MCV: 94.5 fL (ref 78.0–100.0)
Platelets: 253 10*3/uL (ref 150–400)
RBC: 4.88 MIL/uL (ref 4.22–5.81)
RDW: 13.2 % (ref 11.5–15.5)
WBC: 6.3 10*3/uL (ref 4.0–10.5)

## 2018-04-28 LAB — TYPE AND SCREEN
ABO/RH(D): A POS
Antibody Screen: NEGATIVE

## 2018-04-28 LAB — GLUCOSE, CAPILLARY: Glucose-Capillary: 111 mg/dL — ABNORMAL HIGH (ref 70–99)

## 2018-04-28 LAB — HEMOGLOBIN A1C
HEMOGLOBIN A1C: 5.7 % — AB (ref 4.8–5.6)
MEAN PLASMA GLUCOSE: 116.89 mg/dL

## 2018-04-28 LAB — SURGICAL PCR SCREEN
MRSA, PCR: NEGATIVE
STAPHYLOCOCCUS AUREUS: NEGATIVE

## 2018-04-28 LAB — ABO/RH: ABO/RH(D): A POS

## 2018-04-29 ENCOUNTER — Ambulatory Visit (HOSPITAL_COMMUNITY): Payer: Medicare Other | Admitting: Physician Assistant

## 2018-04-29 NOTE — Progress Notes (Signed)
Anesthesia Chart Review:  Case:  [id number] Date/Time:  05/03/18 1202   Procedure:  Cervical 5-6 Cervical 7-Thoracic 1 Anterior cervical decompression/discectomy/fusion (N/A ) - Cervical 5-6 Cervical 7-Thoracic 1 Anterior cervical decompression/discectomy/fusion   Anesthesia type:  General   Pre-op diagnosis:  Cervical myelopathy   Location:  [facility] OR ROOM 21 / [facility]   Surgeon:  Kristeen Miss, MD      DISCUSSION: 67 yo current smoker for above procedure. Pertinent hx includes HTN, DMII, Acid reflux.  Preop evaluation indicates pt has used cocaine within the past month. Dr. Clarice Pole office was made aware and requested DOS UDS, order placed.  Elevated creatinine 1.57 on preop testing. Review of previous labs shows similar values previously, appears stable.  Creatinine, Ser (mg/dL)  Date Value  04/28/2018 1.57 (H)  04/09/2017 1.57 (H)  03/27/2017 1.10  03/26/2017 1.46 (H)  03/11/2012 1.12    Anticipate can proceed with surgery as planned barring acute status change.  VS: BP (!) 151/91   Pulse 94   Temp 36.9 C   Resp 20   Ht 6\' 2"  (1.88 m)   Wt 219 lb 11.2 oz (99.7 kg)   SpO2 95%   BMI 28.21 kg/m   PROVIDERS: Roxanna Mew, MD is PCP at [facility]: Labs reviewed: Acceptable for surgery.  Elevated creatinine, stable when compared to previous labs one year ago. (all labs ordered are listed, but only abnormal results are displayed)  Labs Reviewed  GLUCOSE, CAPILLARY - Abnormal; Notable for the following components:      Result Value   Glucose-Capillary 111 (*)    All other components within normal limits  COMPREHENSIVE METABOLIC PANEL - Abnormal; Notable for the following components:   Glucose, Bld 114 (*)    Creatinine, Ser 1.57 (*)    Albumin 3.1 (*)    GFR calc non Af Amer 43 (*)    GFR calc Af Amer 50 (*)    All other components within normal limits  HEMOGLOBIN A1C - Abnormal; Notable for the following components:   Hgb A1c MFr Bld 5.7 (*)    All other  components within normal limits  SURGICAL PCR SCREEN  CBC  TYPE AND SCREEN  ABO/RH     IMAGES: CT Abdomen 04/09/2017 IMPRESSION: Re- demonstrated superior endplate compression deformity of the T12 vertebral body. Recommend correlation for point tenderness.  Interval decrease in inflammatory stranding involving the left gluteal cleft.  Stool throughout the colon as can be seen with constipation.  Persistent bilateral indeterminate adrenal nodules. Consider follow-up CT    EKG: 04/28/2018: Normal sinus rhythm. Minimal voltage criteria for LVH, may be normal variant   CV: N/A  Past Medical History:  Diagnosis Date  . Abscess 02/2017   LEFT GLUTEAL   . Acid reflux   . DDD (degenerative disc disease), lumbar   . Diabetes mellitus without complication ([place])   . Enlarged prostate   . Hypertension   . Spinal stenosis, lumbar     Past Surgical History:  Procedure Laterality Date  . BACK SURGERY     cervical x2  . INCISION AND DRAINAGE PERIRECTAL ABSCESS N/A 03/26/2017   Procedure: IRRIGATION AND DEBRIDEMENT PERIRECTAL ABSCESS;  Surgeon: Rolm Bookbinder, MD;  Location: [facility];  Service: General;  Laterality: N/A;    MEDICATIONS: . clonazePAM (KLONOPIN) 1 MG tablet  . diclofenac sodium (VOLTAREN) 1 % GEL  . HYDROcodone-acetaminophen (NORCO/VICODIN) 5-325 MG tablet  . lidocaine (LIDODERM) 5 %  . metFORMIN (GLUCOPHAGE) 500 MG  tablet  . methocarbamol (ROBAXIN) 500 MG tablet  . oxyCODONE-acetaminophen (PERCOCET/ROXICET) 5-325 MG per tablet  . oxyCODONE-acetaminophen (PERCOCET/ROXICET) 5-325 MG tablet  . ranitidine (ZANTAC) 150 MG tablet  . tamsulosin (FLOMAX) 0.4 MG CAPS capsule  . traMADol (ULTRAM) 50 MG tablet  . triamterene-hydrochlorothiazide (MAXZIDE) 75-50 MG per tablet   No current facility-administered medications for this encounter.      Wynonia Musty [facility] Short Stay Center/Anesthesiology Phone [phone] 04/29/2018 11:32 AM

## 2018-05-03 ENCOUNTER — Other Ambulatory Visit: Payer: Self-pay

## 2018-05-03 ENCOUNTER — Encounter (HOSPITAL_COMMUNITY)
Admission: RE | Disposition: A | Discharge status: Home / Self Care | Payer: Self-pay | Source: Ambulatory Visit | Attending: Neurological Surgery

## 2018-05-03 ENCOUNTER — Encounter (HOSPITAL_COMMUNITY): Payer: Self-pay

## 2018-05-03 ENCOUNTER — Ambulatory Visit (HOSPITAL_COMMUNITY)
Admission: RE | Admit: 2018-05-03 | Discharge: 2018-05-03 | Disposition: A | Discharge status: Home / Self Care | Payer: Medicare Other | Source: Ambulatory Visit | Attending: Neurological Surgery | Admitting: Neurological Surgery

## 2018-05-03 DIAGNOSIS — M4712 Other spondylosis with myelopathy, cervical region: Secondary | ICD-10-CM | POA: Insufficient documentation

## 2018-05-03 DIAGNOSIS — I10 Essential (primary) hypertension: Secondary | ICD-10-CM | POA: Diagnosis not present

## 2018-05-03 DIAGNOSIS — M6250 Muscle wasting and atrophy, not elsewhere classified, unspecified site: Secondary | ICD-10-CM | POA: Insufficient documentation

## 2018-05-03 DIAGNOSIS — Z538 Procedure and treatment not carried out for other reasons: Secondary | ICD-10-CM | POA: Diagnosis not present

## 2018-05-03 DIAGNOSIS — M4713 Other spondylosis with myelopathy, cervicothoracic region: Secondary | ICD-10-CM | POA: Insufficient documentation

## 2018-05-03 DIAGNOSIS — E119 Type 2 diabetes mellitus without complications: Secondary | ICD-10-CM | POA: Diagnosis not present

## 2018-05-03 DIAGNOSIS — F17210 Nicotine dependence, cigarettes, uncomplicated: Secondary | ICD-10-CM | POA: Diagnosis not present

## 2018-05-03 LAB — RAPID URINE DRUG SCREEN, HOSP PERFORMED
AMPHETAMINES: NOT DETECTED
BENZODIAZEPINES: NOT DETECTED
Barbiturates: NOT DETECTED
Cocaine: POSITIVE — AB
Opiates: NOT DETECTED
TETRAHYDROCANNABINOL: NOT DETECTED

## 2018-05-03 LAB — GLUCOSE, CAPILLARY
Glucose-Capillary: 98 mg/dL (ref 70–99)
Glucose-Capillary: 88 mg/dL (ref 70–99)

## 2018-05-03 SURGERY — CANCELLED PROCEDURE
Anesthesia: General

## 2018-05-03 MED ORDER — CHLORHEXIDINE GLUCONATE CLOTH 2 % EX PADS: 6.0000 | MEDICATED_PAD | Freq: Once | CUTANEOUS | Status: DC

## 2018-05-03 MED ORDER — MIDAZOLAM HCL 2 MG/2ML IJ SOLN
INTRAMUSCULAR | Status: AC
  Filled 2018-05-03: qty 2

## 2018-05-03 MED ORDER — ROCURONIUM BROMIDE 10 MG/ML (PF) SYRINGE
PREFILLED_SYRINGE | INTRAVENOUS | Status: AC
  Filled 2018-05-03: qty 10

## 2018-05-03 MED ORDER — LIDOCAINE 2% (20 MG/ML) 5 ML SYRINGE
INTRAMUSCULAR | Status: AC
  Filled 2018-05-03: qty 5

## 2018-05-03 MED ORDER — CEFAZOLIN SODIUM-DEXTROSE 2-4 GM/100ML-% IV SOLN
2.0000 g | INTRAVENOUS | Status: DC
  Filled 2018-05-03: qty 100

## 2018-05-03 MED ORDER — FENTANYL CITRATE (PF) 250 MCG/5ML IJ SOLN
INTRAMUSCULAR | Status: AC
  Filled 2018-05-03: qty 5

## 2018-05-03 MED ORDER — PROPOFOL 10 MG/ML IV BOLUS
INTRAVENOUS | Status: AC
  Filled 2018-05-03: qty 20

## 2018-05-03 MED ORDER — LACTATED RINGERS IV SOLN
INTRAVENOUS | Status: DC
  Administered 2018-05-03: 11:00:00 via INTRAVENOUS

## 2018-05-03 MED ORDER — GLYCOPYRROLATE PF 0.2 MG/ML IJ SOSY
PREFILLED_SYRINGE | INTRAMUSCULAR | Status: AC
  Filled 2018-05-03: qty 1

## 2018-05-03 NOTE — Anesthesia Preprocedure Evaluation (Deleted)
Anesthesia Evaluation  Patient identified by MRN, date of birth, ID band Patient awake    Reviewed: Allergy & Precautions, H&P , NPO status , Patient's Chart, lab work & pertinent test results  Airway Mallampati: II  TM Distance: >3 FB Neck ROM: Full    Dental no notable dental hx. (+) Edentulous Upper, Edentulous Lower, Dental Advisory Given   Pulmonary Current Smoker,    Pulmonary exam normal breath sounds clear to auscultation       Cardiovascular hypertension, Pt. on medications  Rhythm:Regular Rate:Normal     Neuro/Psych negative neurological ROS  negative psych ROS   GI/Hepatic GERD  Medicated and Controlled,(+)     substance abuse  cocaine use and marijuana use,   Endo/Other  diabetes, Type 2, Oral Hypoglycemic Agents  Renal/GU negative Renal ROS  negative genitourinary   Musculoskeletal  (+) Arthritis , Osteoarthritis,    Abdominal   Peds  Hematology negative hematology ROS (+)   Anesthesia Other Findings   Reproductive/Obstetrics negative OB ROS                             Anesthesia Physical Anesthesia Plan  ASA: III  Anesthesia Plan: General   Post-op Pain Management:    Induction: Intravenous  PONV Risk Score and Plan: 2 and Ondansetron, Dexamethasone and Midazolam  Airway Management Planned: Oral ETT  Additional Equipment:   Intra-op Plan:   Post-operative Plan: Extubation in OR  Informed Consent: I have reviewed the patients History and Physical, chart, labs and discussed the procedure including the risks, benefits and alternatives for the proposed anesthesia with the patient or authorized representative who has indicated his/her understanding and acceptance.   Dental advisory given  Plan Discussed with: CRNA  Anesthesia Plan Comments:         Anesthesia Quick Evaluation

## 2018-05-03 NOTE — H&P (Signed)
Tim Bradley is an 67 y.o. male.   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  Lately he is experienced increasing weakness in his hands particularly in the intrinsics with loss of muscle mass and atrophy.  A recent MRI performed in April demonstrates that the patient has evidence of a large disc herniation at the C7-T1 level behind the body of C7 in addition to advanced spondylosis at C5-C6 above his C6-C7 fusion with moderate cord compression at that level he has by foraminal stenosis at both C5-6 and at C7-T1.  I advised surgical decompression of both these areas and he is now admitted for this procedure.  Past Medical History:  Diagnosis Date  . Abscess 02/2017   LEFT GLUTEAL   . Acid reflux   . DDD (degenerative disc disease), lumbar   . Diabetes mellitus without complication ([place])   . Enlarged prostate   . Hypertension   . Spinal stenosis, lumbar     Past Surgical History:  Procedure Laterality Date  . BACK SURGERY     cervical x2  . INCISION AND DRAINAGE PERIRECTAL ABSCESS N/A 03/26/2017   Procedure: IRRIGATION AND DEBRIDEMENT PERIRECTAL ABSCESS;  Surgeon: Rolm Bookbinder, MD;  Location: [facility];  Service: General;  Laterality: N/A;    Family History  Problem Relation Age of Onset  . Hypertension Mother   . Cancer - Lung Father    Social History:  reports that he has been smoking.  He has been smoking about 0.50 packs per day. He has never used smokeless tobacco. He reports that he drinks about 7.2 oz of alcohol per week. He reports  that he has current or past drug history. Drugs: Cocaine and Marijuana.  Allergies: No Known Allergies  No medications prior to admission.    No results found for this or any previous visit (from the past 48 hour(s)). No results found.  Review of Systems  Constitutional: Positive for malaise/fatigue.  HENT: Negative.   Eyes: Negative.   Respiratory: Negative.   Cardiovascular: Negative.   Gastrointestinal: Negative.   Genitourinary: Negative.   Musculoskeletal: Positive for back pain and neck pain.  Skin: Negative.   Neurological:       Weakness in the upper and lower extremities.  Endo/Heme/Allergies: Negative.   Psychiatric/Behavioral: Negative.     There were no vitals taken for this visit. Physical Exam  Constitutional: He is oriented to person, place, and time. He appears well-developed and well-nourished.  HENT:  Head: Normocephalic and atraumatic.  Eyes: Pupils are equal, round, and reactive to light. Conjunctivae and EOM are normal.  Neck:  Omitted range of motion turning only 30 degrees to the left into the right flexing and extending less than 50% of normal.  Musculoskeletal:  Atrophy in intrinsic muscles of both hands left worse than right  Neurological: He is alert and oriented to person, place, and time.  Weakness in the wrist extensors grips and intrinsics bilaterally graded at 4- out of 5.  Rapid alternating movements were not possible in the upper extremities due to stiffness and rigidity.  Absent reflexes in the biceps and triceps 3+ reflexes in the patellae and Achilles upgoing Babinski's bilaterally.  Cranial nerve examination is normal.  Skin: Skin is warm and dry.  Psychiatric: He has a normal mood and affect. His behavior is normal. Judgment and thought content normal.     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  Bilateral upper extremity weakness with intrinsic muscle atrophy.  Plan: Anterior cervical decompression C5-6 and  C7-T1.  Arthrodesis with allograft and structural plate.  Earleen Newport, MD 05/03/2018, 7:37 AM

## 2018-05-03 NOTE — Progress Notes (Signed)
Pt notified of need for a urine specimen per orders for a UDS. Pt unable to produce sample at this time. Pt also requesting Xanax. BP 173/99, T; 99-O, HR 91, R 18, SpO2 97 on RA, CBG 98. Dr. Ola Spurr notified. Per Dr. Ola Spurr nothing will be ordered for pt until a specimen is collected and resulted.

## 2018-05-04 ENCOUNTER — Other Ambulatory Visit: Payer: Self-pay | Admitting: Neurological Surgery

## 2018-05-06 ENCOUNTER — Ambulatory Visit (HOSPITAL_COMMUNITY): Payer: Medicare Other | Admitting: Anesthesiology

## 2018-05-06 ENCOUNTER — Other Ambulatory Visit: Payer: Self-pay | Admitting: Neurological Surgery

## 2018-05-06 ENCOUNTER — Other Ambulatory Visit: Payer: Self-pay

## 2018-05-06 ENCOUNTER — Encounter (HOSPITAL_COMMUNITY): Payer: Self-pay | Admitting: *Deleted

## 2018-05-06 NOTE — Anesthesia Preprocedure Evaluation (Deleted)
Anesthesia Evaluation  Patient identified by MRN, date of birth, ID band Patient awake    Reviewed: Allergy & Precautions, H&P , NPO status , Patient's Chart, lab work & pertinent test results  Airway Mallampati: II  TM Distance: >3 FB Neck ROM: Full    Dental no notable dental hx. (+) Edentulous Upper, Edentulous Lower, Dental Advisory Given   Pulmonary Current Smoker,    Pulmonary exam normal breath sounds clear to auscultation       Cardiovascular Exercise Tolerance: Good hypertension, Pt. on medications  Rhythm:Regular Rate:Normal     Neuro/Psych negative neurological ROS  negative psych ROS   GI/Hepatic GERD  Medicated,(+)     substance abuse  cocaine use,   Endo/Other  diabetes, Type 2, Oral Hypoglycemic Agents  Renal/GU negative Renal ROS  negative genitourinary   Musculoskeletal  (+) Arthritis ,   Abdominal   Peds  Hematology negative hematology ROS (+)   Anesthesia Other Findings   Reproductive/Obstetrics negative OB ROS                            Anesthesia Physical Anesthesia Plan  ASA: III  Anesthesia Plan: General   Post-op Pain Management:    Induction: Intravenous  PONV Risk Score and Plan: 2 and Ondansetron and Midazolam  Airway Management Planned: Oral ETT  Additional Equipment:   Intra-op Plan:   Post-operative Plan: Extubation in OR  Informed Consent: I have reviewed the patients History and Physical, chart, labs and discussed the procedure including the risks, benefits and alternatives for the proposed anesthesia with the patient or authorized representative who has indicated his/her understanding and acceptance.   Dental advisory given  Plan Discussed with: CRNA  Anesthesia Plan Comments:         Anesthesia Quick Evaluation

## 2018-05-06 NOTE — Progress Notes (Signed)
   05/06/18 1641  OBSTRUCTIVE SLEEP APNEA  Have you ever been diagnosed with sleep apnea through a sleep study? No  Do you snore loudly (loud enough to be heard through closed doors)?  0  Do you often feel tired, fatigued, or sleepy during the daytime (such as falling asleep during driving or talking to someone)? 0  Has anyone observed you stop breathing during your sleep? 0  Do you have, or are you being treated for high blood pressure? 1  BMI more than 35 kg/m2? 0  Age > 50 (1-yes) 1  Neck circumference greater than:Male 16 inches or larger, Male 17inches or larger? 1  Male Gender (Yes=1) 1  Obstructive Sleep Apnea Score 4

## 2018-05-06 NOTE — Progress Notes (Signed)
Pt denies SOB, chest pain, and being under the care of a cardiologist. Pt denies having a stress test, echo and cardiac cath . Pt denies having a chest x ray within the last year. Pt had recent labs (in [place]) . Pt made aware to stop taking  Aspirin, vitamins, fish oil and herbal medications. Do not take any NSAIDs ie: Ibuprofen, Advil, Naproxen (Aleve), Motrin, BC and Goody Powder, Voltaren or any medication containing Aspirin. Pt made aware to not take Metformin DOS. Pt made aware to check BG every 2 hours prior to arrival to hospital on DOS. Pt made aware to treat a BG < 70 with  4 ounces of apple  juice, wait 15 minutes after intervention to recheck BG, if BG remains < 70, call Short Stay unit to speak with a nurse. Pt verbalized understanding of all pre-op instructions.

## 2018-05-07 ENCOUNTER — Encounter (HOSPITAL_COMMUNITY)
Admission: RE | Disposition: A | Discharge status: Home / Self Care | Payer: Self-pay | Source: Ambulatory Visit | Attending: Neurological Surgery

## 2018-05-07 ENCOUNTER — Ambulatory Visit (HOSPITAL_COMMUNITY)
Admission: RE | Admit: 2018-05-07 | Discharge: 2018-05-07 | Disposition: A | Discharge status: Home / Self Care | Payer: Medicare Other | Source: Ambulatory Visit | Attending: Neurological Surgery | Admitting: Neurological Surgery

## 2018-05-07 ENCOUNTER — Encounter (HOSPITAL_COMMUNITY): Payer: Self-pay | Admitting: *Deleted

## 2018-05-07 DIAGNOSIS — Z7984 Long term (current) use of oral hypoglycemic drugs: Secondary | ICD-10-CM | POA: Insufficient documentation

## 2018-05-07 DIAGNOSIS — I10 Essential (primary) hypertension: Secondary | ICD-10-CM | POA: Diagnosis not present

## 2018-05-07 DIAGNOSIS — M5136 Other intervertebral disc degeneration, lumbar region: Secondary | ICD-10-CM | POA: Insufficient documentation

## 2018-05-07 DIAGNOSIS — M50022 Cervical disc disorder at C5-C6 level with myelopathy: Secondary | ICD-10-CM | POA: Diagnosis not present

## 2018-05-07 DIAGNOSIS — Z5309 Procedure and treatment not carried out because of other contraindication: Secondary | ICD-10-CM | POA: Diagnosis not present

## 2018-05-07 DIAGNOSIS — E119 Type 2 diabetes mellitus without complications: Secondary | ICD-10-CM | POA: Insufficient documentation

## 2018-05-07 DIAGNOSIS — M48061 Spinal stenosis, lumbar region without neurogenic claudication: Secondary | ICD-10-CM | POA: Insufficient documentation

## 2018-05-07 DIAGNOSIS — M4712 Other spondylosis with myelopathy, cervical region: Secondary | ICD-10-CM | POA: Diagnosis not present

## 2018-05-07 DIAGNOSIS — N400 Enlarged prostate without lower urinary tract symptoms: Secondary | ICD-10-CM | POA: Insufficient documentation

## 2018-05-07 DIAGNOSIS — F17210 Nicotine dependence, cigarettes, uncomplicated: Secondary | ICD-10-CM | POA: Insufficient documentation

## 2018-05-07 DIAGNOSIS — K219 Gastro-esophageal reflux disease without esophagitis: Secondary | ICD-10-CM | POA: Diagnosis not present

## 2018-05-07 LAB — GLUCOSE, CAPILLARY: Glucose-Capillary: 110 mg/dL — ABNORMAL HIGH (ref 70–99)

## 2018-05-07 LAB — RAPID URINE DRUG SCREEN, HOSP PERFORMED
AMPHETAMINES: NOT DETECTED
BARBITURATES: NOT DETECTED
Benzodiazepines: NOT DETECTED
Cocaine: POSITIVE — AB
Opiates: NOT DETECTED
TETRAHYDROCANNABINOL: NOT DETECTED

## 2018-05-07 SURGERY — ANTERIOR CERVICAL DECOMPRESSION/DISCECTOMY FUSION 2 LEVELS
Anesthesia: General

## 2018-05-07 MED ORDER — LIDOCAINE-EPINEPHRINE 1 %-1:100000 IJ SOLN
INTRAMUSCULAR | Status: AC
  Filled 2018-05-07: qty 1

## 2018-05-07 MED ORDER — FENTANYL CITRATE (PF) 250 MCG/5ML IJ SOLN
INTRAMUSCULAR | Status: AC
  Filled 2018-05-07: qty 5

## 2018-05-07 MED ORDER — BUPIVACAINE HCL (PF) 0.5 % IJ SOLN
INTRAMUSCULAR | Status: AC
  Filled 2018-05-07: qty 30

## 2018-05-07 MED ORDER — MIDAZOLAM HCL 2 MG/2ML IJ SOLN
INTRAMUSCULAR | Status: AC
  Filled 2018-05-07: qty 2

## 2018-05-07 SURGICAL SUPPLY — 45 items
ADH SKN CLS APL DERMABOND .7 (GAUZE/BANDAGES/DRESSINGS)
BAG DECANTER FOR FLEXI CONT (MISCELLANEOUS) ×1 IMPLANT
BIT DRILL NEURO 2X3.1 SFT TUCH (MISCELLANEOUS) ×1 IMPLANT
BNDG GAUZE ELAST 4 BULKY (GAUZE/BANDAGES/DRESSINGS) IMPLANT
BUR BARREL STRAIGHT FLUTE 4.0 (BURR) IMPLANT
CANISTER SUCT 3000ML PPV (MISCELLANEOUS) ×1 IMPLANT
DECANTER SPIKE VIAL GLASS SM (MISCELLANEOUS) ×1 IMPLANT
DERMABOND ADVANCED (GAUZE/BANDAGES/DRESSINGS)
DERMABOND ADVANCED .7 DNX12 (GAUZE/BANDAGES/DRESSINGS) ×1 IMPLANT
DRAPE LAPAROTOMY 100X72 PEDS (DRAPES) ×1 IMPLANT
DRAPE MICROSCOPE LEICA (MISCELLANEOUS) IMPLANT
DRILL NEURO 2X3.1 SOFT TOUCH (MISCELLANEOUS)
DURAPREP 6ML APPLICATOR 50/CS (WOUND CARE) ×1 IMPLANT
ELECT REM PT RETURN 9FT ADLT (ELECTROSURGICAL)
ELECTRODE REM PT RTRN 9FT ADLT (ELECTROSURGICAL) ×1 IMPLANT
FLOSEAL 5ML (HEMOSTASIS) IMPLANT
GAUZE 4X4 16PLY RFD (DISPOSABLE) IMPLANT
GAUZE SPONGE 4X4 12PLY STRL (GAUZE/BANDAGES/DRESSINGS) ×1 IMPLANT
GLOVE BIOGEL PI IND STRL 8.5 (GLOVE) ×1 IMPLANT
GLOVE BIOGEL PI INDICATOR 8.5 (GLOVE)
GLOVE ECLIPSE 8.5 STRL (GLOVE) ×1 IMPLANT
GLOVE EXAM NITRILE LRG STRL (GLOVE) IMPLANT
GLOVE EXAM NITRILE XL STR (GLOVE) IMPLANT
GLOVE EXAM NITRILE XS STR PU (GLOVE) IMPLANT
GOWN STRL REUS W/ TWL LRG LVL3 (GOWN DISPOSABLE) IMPLANT
GOWN STRL REUS W/ TWL XL LVL3 (GOWN DISPOSABLE) IMPLANT
GOWN STRL REUS W/TWL 2XL LVL3 (GOWN DISPOSABLE) ×4 IMPLANT
GOWN STRL REUS W/TWL LRG LVL3 (GOWN DISPOSABLE)
GOWN STRL REUS W/TWL XL LVL3 (GOWN DISPOSABLE)
HALTER HD/CHIN CERV TRACTION D (MISCELLANEOUS) ×1 IMPLANT
HEMOSTAT POWDER KIT SURGIFOAM (HEMOSTASIS) ×1 IMPLANT
KIT BASIN OR (CUSTOM PROCEDURE TRAY) ×1 IMPLANT
KIT TURNOVER KIT B (KITS) ×1 IMPLANT
NDL SPNL 22GX3.5 QUINCKE BK (NEEDLE) ×1 IMPLANT
NEEDLE HYPO 22GX1.5 SAFETY (NEEDLE) ×1 IMPLANT
NEEDLE SPNL 22GX3.5 QUINCKE BK (NEEDLE) IMPLANT
NS IRRIG 1000ML POUR BTL (IV SOLUTION) ×1 IMPLANT
PACK LAMINECTOMY NEURO (CUSTOM PROCEDURE TRAY) ×1 IMPLANT
PAD ARMBOARD 7.5X6 YLW CONV (MISCELLANEOUS) ×3 IMPLANT
RUBBERBAND STERILE (MISCELLANEOUS) IMPLANT
SPONGE INTESTINAL PEANUT (DISPOSABLE) ×1 IMPLANT
SUT VIC AB 4-0 RB1 18 (SUTURE) ×2 IMPLANT
TOWEL GREEN STERILE (TOWEL DISPOSABLE) ×1 IMPLANT
TOWEL GREEN STERILE FF (TOWEL DISPOSABLE) ×1 IMPLANT
WATER STERILE IRR 1000ML POUR (IV SOLUTION) ×1 IMPLANT

## 2018-05-07 NOTE — Progress Notes (Signed)
Patient's urine drug screen came back positive for cocaine, tho patient emphatically denies using any since this past Sunday.  He wants to wait until the drug screen comes back "Neg", so decision by all was to let patient reschedule.  Dr. Ellene Route aware and has been by to chat with patient. Discharged from facility by [facility] personnel and follow by family.

## 2018-05-07 NOTE — H&P (Addendum)
Patient was to be admitted on 05/03/2018 for anterior cervical decompression above and below previously C6-C7 decompression and arthrodesis.  At the time of admission he was found to be positive for substances in his urine.  He is been rescheduled for surgery today and no changes have occurred to his history and physical since 05/03/2018. Tim Bradley is an 67 y.o. male.   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  Lately he is experienced increasing weakness in his hands particularly in the intrinsics with loss of muscle mass and atrophy.  A recent MRI performed in April demonstrates that the patient has evidence of a large disc herniation at the C7-T1 level behind the body of C7 in addition to advanced spondylosis at C5-C6 above his C6-C7 fusion with moderate cord compression at that level he has by foraminal stenosis at both C5-6 and at C7-T1.  I advised surgical decompression of both these areas and he is now admitted for this procedure.      Past Medical History:  Diagnosis Date  . Abscess 02/2017   LEFT GLUTEAL   . Acid reflux   . DDD (degenerative disc disease), lumbar   . Diabetes mellitus without complication ([place])   . Enlarged prostate   . Hypertension   . Spinal stenosis, lumbar          Past Surgical History:  Procedure Laterality Date  . BACK SURGERY     cervical x2  . INCISION AND DRAINAGE PERIRECTAL ABSCESS N/A 03/26/2017   Procedure: IRRIGATION AND DEBRIDEMENT PERIRECTAL ABSCESS;  Surgeon: Rolm Bookbinder, MD;   Location: [facility];  Service: General;  Laterality: N/A;         Family History  Problem Relation Age of Onset  . Hypertension Mother   . Cancer - Lung Father    Social History:  reports that he has been smoking.  He has been smoking about 0.50 packs per day. He has never used smokeless tobacco. He reports that he drinks about 7.2 oz of alcohol per week. He reports that he has current or past drug history. Drugs: Cocaine and Marijuana.  Allergies: No Known Allergies  No medications prior to admission.    LabResultsLast48Hours  No results found for this or any previous visit (from the past 48 hour(s)).   ImagingResults(Last48hours)  No results found.    Review of Systems  Constitutional: Positive for malaise/fatigue.  HENT: Negative.   Eyes: Negative.   Respiratory: Negative.   Cardiovascular: Negative.   Gastrointestinal: Negative.   Genitourinary: Negative.   Musculoskeletal: Positive for back pain and neck pain.  Skin: Negative.   Neurological:       Weakness in the upper and lower extremities.  Endo/Heme/Allergies: Negative.   Psychiatric/Behavioral: Negative.     There were no vitals taken for this visit. Physical Exam  Constitutional: He is oriented to person, place, and time. He appears well-developed and well-nourished.  HENT:  Head: Normocephalic and atraumatic.  Eyes: Pupils are equal, round, and reactive to light. Conjunctivae and EOM are normal.  Neck:  Omitted range  of motion turning only 30 degrees to the left into the right flexing and extending less than 50% of normal.  Musculoskeletal:  Atrophy in intrinsic muscles of both hands left worse than right  Neurological: He is alert and oriented to person, place, and time.  Weakness in the wrist extensors grips and intrinsics bilaterally graded at 4- out of 5.  Rapid alternating movements were not possible in the upper extremities due to stiffness and rigidity.  Absent reflexes in the  biceps and triceps 3+ reflexes in the patellae and Achilles upgoing Babinski's bilaterally.  Cranial nerve examination is normal.  Skin: Skin is warm and dry.  Psychiatric: He has a normal mood and affect. His behavior is normal. Judgment and thought content normal.     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  Bilateral upper extremity weakness with intrinsic muscle atrophy.  Plan: Anterior cervical decompression C5-6 and C7-T1.  Arthrodesis with allograft and structural plate.  Tim Newport, MD 05/03/2018, 7:37 AM

## 2018-08-13 ENCOUNTER — Emergency Department (HOSPITAL_COMMUNITY)
Admission: EM | Admit: 2018-08-13 | Discharge: 2018-08-13 | Disposition: A | Discharge status: Home / Self Care | Payer: Non-veteran care | Attending: Emergency Medicine | Admitting: Emergency Medicine

## 2018-08-13 ENCOUNTER — Encounter (HOSPITAL_COMMUNITY): Payer: Self-pay

## 2018-08-13 ENCOUNTER — Emergency Department (HOSPITAL_COMMUNITY): Payer: Non-veteran care

## 2018-08-13 DIAGNOSIS — K5900 Constipation, unspecified: Secondary | ICD-10-CM | POA: Diagnosis not present

## 2018-08-13 DIAGNOSIS — R1084 Generalized abdominal pain: Secondary | ICD-10-CM | POA: Diagnosis present

## 2018-08-13 DIAGNOSIS — F17210 Nicotine dependence, cigarettes, uncomplicated: Secondary | ICD-10-CM | POA: Diagnosis not present

## 2018-08-13 DIAGNOSIS — F1410 Cocaine abuse, uncomplicated: Secondary | ICD-10-CM | POA: Diagnosis not present

## 2018-08-13 DIAGNOSIS — F1210 Cannabis abuse, uncomplicated: Secondary | ICD-10-CM | POA: Diagnosis not present

## 2018-08-13 DIAGNOSIS — E119 Type 2 diabetes mellitus without complications: Secondary | ICD-10-CM | POA: Insufficient documentation

## 2018-08-13 DIAGNOSIS — Z7984 Long term (current) use of oral hypoglycemic drugs: Secondary | ICD-10-CM | POA: Insufficient documentation

## 2018-08-13 DIAGNOSIS — F1910 Other psychoactive substance abuse, uncomplicated: Secondary | ICD-10-CM | POA: Diagnosis not present

## 2018-08-13 DIAGNOSIS — K2960 Other gastritis without bleeding: Secondary | ICD-10-CM | POA: Insufficient documentation

## 2018-08-13 DIAGNOSIS — I10 Essential (primary) hypertension: Secondary | ICD-10-CM | POA: Diagnosis not present

## 2018-08-13 DIAGNOSIS — Z79899 Other long term (current) drug therapy: Secondary | ICD-10-CM | POA: Diagnosis not present

## 2018-08-13 LAB — COMPREHENSIVE METABOLIC PANEL
ALBUMIN: 2.7 g/dL — AB (ref 3.5–5.0)
ALK PHOS: 72 U/L (ref 38–126)
ALT: 17 U/L (ref 0–44)
ANION GAP: 10 (ref 5–15)
AST: 19 U/L (ref 15–41)
BILIRUBIN TOTAL: 0.7 mg/dL (ref 0.3–1.2)
BUN: 19 mg/dL (ref 8–23)
CALCIUM: 8.9 mg/dL (ref 8.9–10.3)
CO2: 27 mmol/L (ref 22–32)
Chloride: 94 mmol/L — ABNORMAL LOW (ref 98–111)
Creatinine, Ser: 1.66 mg/dL — ABNORMAL HIGH (ref 0.61–1.24)
GFR calc Af Amer: 47 mL/min — ABNORMAL LOW (ref >60)
GFR calc non Af Amer: 40 mL/min — ABNORMAL LOW (ref >60)
GLUCOSE: 143 mg/dL — AB (ref 70–99)
POTASSIUM: 4 mmol/L (ref 3.5–5.1)
Sodium: 131 mmol/L — ABNORMAL LOW (ref 135–145)
Total Protein: 6.5 g/dL (ref 6.5–8.1)

## 2018-08-13 LAB — URINALYSIS, ROUTINE W REFLEX MICROSCOPIC
Bilirubin Urine: NEGATIVE
Glucose, UA: NEGATIVE mg/dL
Hgb urine dipstick: NEGATIVE
Ketones, ur: NEGATIVE mg/dL
Nitrite: NEGATIVE
SPECIFIC GRAVITY, URINE: 1.032 — AB (ref 1.005–1.030)
pH: 6 (ref 5.0–8.0)

## 2018-08-13 LAB — CBC WITH DIFFERENTIAL/PLATELET
Abs Immature Granulocytes: 0.03 10*3/uL (ref 0.00–0.07)
Basophils Absolute: 0 10*3/uL (ref 0.0–0.1)
Basophils Relative: 0 %
EOS ABS: 0.1 10*3/uL (ref 0.0–0.5)
EOS PCT: 1 %
HEMATOCRIT: 47.4 % (ref 39.0–52.0)
HEMOGLOBIN: 14.5 g/dL (ref 13.0–17.0)
Immature Granulocytes: 0 %
LYMPHS ABS: 1.6 10*3/uL (ref 0.7–4.0)
LYMPHS PCT: 22 %
MCH: 27.9 pg (ref 26.0–34.0)
MCHC: 30.6 g/dL (ref 30.0–36.0)
MCV: 91.3 fL (ref 80.0–100.0)
MONO ABS: 0.7 10*3/uL (ref 0.1–1.0)
MONOS PCT: 10 %
NEUTROS ABS: 4.8 10*3/uL (ref 1.7–7.7)
Neutrophils Relative %: 67 %
Platelets: 411 10*3/uL — ABNORMAL HIGH (ref 150–400)
RBC: 5.19 MIL/uL (ref 4.22–5.81)
RDW: 11.8 % (ref 11.5–15.5)
WBC: 7.3 10*3/uL (ref 4.0–10.5)
nRBC: 0 % (ref 0.0–0.2)

## 2018-08-13 MED ORDER — FLEET ENEMA 7-19 GM/118ML RE ENEM: 1.0000 | ENEMA | Freq: Once | RECTAL | 0 refills | Status: AC

## 2018-08-13 MED ORDER — POLYETHYLENE GLYCOL 3350 17 G PO PACK: PACK | ORAL | 0 refills | Status: DC

## 2018-08-13 MED ORDER — IOHEXOL 300 MG/ML  SOLN
100.0000 mL | Freq: Once | INTRAMUSCULAR | Status: AC | PRN
  Administered 2018-08-13: 100 mL via INTRAVENOUS

## 2018-08-13 MED ORDER — MAGNESIUM CITRATE PO SOLN: 1.0000 | Freq: Once | ORAL | 0 refills | Status: AC

## 2018-08-13 MED ORDER — METOCLOPRAMIDE HCL 5 MG/ML IJ SOLN
10.0000 mg | Freq: Once | INTRAMUSCULAR | Status: AC
  Administered 2018-08-13: 10 mg via INTRAVENOUS
  Filled 2018-08-13: qty 2

## 2018-08-13 MED ORDER — METOCLOPRAMIDE HCL 10 MG PO TABS: 10.0000 mg | ORAL_TABLET | Freq: Four times a day (QID) | ORAL | 0 refills | Status: DC

## 2018-08-13 MED ORDER — DOCUSATE SODIUM 250 MG PO CAPS: 250.0000 mg | ORAL_CAPSULE | Freq: Every day | ORAL | 0 refills | Status: DC

## 2018-08-13 NOTE — ED Triage Notes (Signed)
Pt states that he has not has a BM in three days, took stool softners without relief, some nausea and generalized abd pain.

## 2018-08-13 NOTE — ED Notes (Signed)
Gave patient a urinal and reminded him that we need a urine specimen.

## 2018-08-13 NOTE — ED Provider Notes (Signed)
[facility] EMERGENCY DEPARTMENT Provider Note   CSN: [id number] Arrival date & time: 08/13/18  0113     History   Chief Complaint Chief Complaint  Patient presents with  . Constipation    HPI Tim Bradley is a 67 y.o. male.  Patient here for evaluation of generalized abdominal pain that started today. He has had nausea without vomiting. He denies fever. No bowel movement in 3 days but he reports he is passing gas. He felt the pain was caused by constipation and he took a stool softener without relief or BM. No abdominal surgeries in the past. No aggravating or alleviating factors.   The history is provided by the patient. No language interpreter was used.  Constipation   Associated symptoms include abdominal pain. Pertinent negatives include no dysuria.    Past Medical History:  Diagnosis Date  . Abscess 02/2017   LEFT GLUTEAL   . Acid reflux   . DDD (degenerative disc disease), lumbar   . Diabetes mellitus without complication ([place])   . Enlarged prostate   . Hypertension   . Spinal stenosis, lumbar     Patient Active Problem List   Diagnosis Date Noted  . Abscess, gluteal, left 03/26/2017  . Type 2 diabetes mellitus ([place]) 03/26/2017  . Adrenal nodule ([place]) 03/26/2017  . Closed fracture of body of thoracic vertebra ([place]) 03/26/2017  . Spinal stenosis of lumbar region 03/26/2017    Past Surgical History:  Procedure Laterality Date  . BACK SURGERY     cervical x2  . INCISION AND DRAINAGE PERIRECTAL ABSCESS N/A 03/26/2017   Procedure: IRRIGATION AND DEBRIDEMENT PERIRECTAL ABSCESS;  Surgeon: Rolm Bookbinder, MD;  Location: [facility];  Service: General;  Laterality: N/A;        Home Medications    Prior to Admission medications   Medication Sig Start Date End Date Taking? Authorizing Provider  clonazePAM (KLONOPIN) 1 MG tablet Take 1 mg by mouth 3 (three) times daily. 03/19/17   [provider]  diclofenac sodium (VOLTAREN) 1 % GEL  Apply 2 g topically 4 (four) times daily as needed (knee pain).    [provider]  HYDROcodone-acetaminophen (NORCO/VICODIN) 5-325 MG tablet Take 1-2 tablets by mouth every 6 (six) hours as needed for moderate pain. Patient not taking: Reported on 03/26/2017 02/25/17   Fredia Sorrow, MD  lidocaine (LIDODERM) 5 % Place 1 patch onto the skin daily. Remove & Discard patch within 12 hours or as directed to knees per MD    [provider]  metFORMIN (GLUCOPHAGE) 500 MG tablet Take 500 mg by mouth 2 (two) times daily with a meal.    [provider]  methocarbamol (ROBAXIN) 500 MG tablet Take 1 tablet (500 mg total) by mouth 2 (two) times daily. Patient not taking: Reported on 03/26/2017 08/03/14   Carlisle Cater, PA-C  oxyCODONE-acetaminophen (PERCOCET/ROXICET) 5-325 MG per tablet Take 1-2 tablets by mouth every 6 (six) hours as needed for severe pain. Patient not taking: Reported on 03/26/2017 08/03/14   Carlisle Cater, PA-C  oxyCODONE-acetaminophen (PERCOCET/ROXICET) 5-325 MG tablet Take 1 tablet by mouth every 4 (four) hours as needed for moderate pain or severe pain. Patient not taking: Reported on 04/09/2017 03/28/17   Fanny Skates, MD  ranitidine (ZANTAC) 150 MG tablet Take 150 mg by mouth daily.     [provider]  tamsulosin (FLOMAX) 0.4 MG CAPS capsule Take 0.4 mg by mouth daily.    [provider]  traMADol (ULTRAM) 50 MG  tablet Take 50 mg by mouth 3 (three) times daily. for pain 04/20/18   [provider]  triamterene-hydrochlorothiazide (MAXZIDE) 75-50 MG per tablet Take 1 tablet by mouth daily.    [provider]    Family History Family History  Problem Relation Age of Onset  . Hypertension Mother   . Cancer - Lung Father     Social History Social History   Tobacco Use  . Smoking status: Current Every Day Smoker    Packs/day: 0.25    Types: Cigarettes  . Smokeless tobacco: Never Used  Substance Use Topics  .  Alcohol use: Yes    Alcohol/week: 12.0 standard drinks    Types: 12 Cans of beer per week    Comment: i can of beer daily  . Drug use: Yes    Types: Cocaine, Marijuana    Comment: cocaine last use Sunday 05/02/18     Allergies   Patient has no known allergies.   Review of Systems Review of Systems  Constitutional: Negative for chills and fever.  Respiratory: Negative.  Negative for shortness of breath.   Cardiovascular: Negative.  Negative for chest pain.  Gastrointestinal: Positive for abdominal distention, abdominal pain, constipation and nausea. Negative for vomiting.  Genitourinary: Negative for dysuria and hematuria.  Musculoskeletal: Negative.  Negative for back pain.  Skin: Negative.   Neurological: Negative.  Negative for syncope and weakness.     Physical Exam Updated Vital Signs BP (!) 159/106   Pulse (!) 108   Temp 98.3 F (36.8 C) (Oral)   Resp 10   SpO2 95%   Physical Exam  Constitutional: He is oriented to person, place, and time. He appears well-developed and well-nourished.  HENT:  Head: Normocephalic.  Neck: Normal range of motion. Neck supple.  Cardiovascular: Normal rate and regular rhythm.  Pulmonary/Chest: Effort normal and breath sounds normal. He has no wheezes. He has no rales.  Abdominal: Soft. He exhibits distension. He exhibits no mass. There is tenderness. There is no rebound and no guarding.  Abdomen is soft, diffusely tender, without mass. BS decreased.   Musculoskeletal: Normal range of motion.  Neurological: He is alert and oriented to person, place, and time.  Skin: Skin is warm and dry. No rash noted.  Psychiatric: He has a normal mood and affect.     ED Treatments / Results  Labs (all labs ordered are listed, but only abnormal results are displayed) Labs Reviewed - No data to display Results for orders placed or performed during the hospital encounter of 08/13/18  CBC with Differential  Result Value Ref Range   WBC 7.3 4.0 -  10.5 K/uL   RBC 5.19 4.22 - 5.81 MIL/uL   Hemoglobin 14.5 13.0 - 17.0 g/dL   HCT 47.4 39.0 - 52.0 %   MCV 91.3 80.0 - 100.0 fL   MCH 27.9 26.0 - 34.0 pg   MCHC 30.6 30.0 - 36.0 g/dL   RDW 11.8 11.5 - 15.5 %   Platelets 411 (H) 150 - 400 K/uL   nRBC 0.0 0.0 - 0.2 %   Neutrophils Relative % 67 %   Neutro Abs 4.8 1.7 - 7.7 K/uL   Lymphocytes Relative 22 %   Lymphs Abs 1.6 0.7 - 4.0 K/uL   Monocytes Relative 10 %   Monocytes Absolute 0.7 0.1 - 1.0 K/uL   Eosinophils Relative 1 %   Eosinophils Absolute 0.1 0.0 - 0.5 K/uL   Basophils Relative 0 %   Basophils Absolute 0.0 0.0 -  0.1 K/uL   Immature Granulocytes 0 %   Abs Immature Granulocytes 0.03 0.00 - 0.07 K/uL  Comprehensive metabolic panel  Result Value Ref Range   Sodium 131 (L) 135 - 145 mmol/L   Potassium 4.0 3.5 - 5.1 mmol/L   Chloride 94 (L) 98 - 111 mmol/L   CO2 27 22 - 32 mmol/L   Glucose, Bld 143 (H) 70 - 99 mg/dL   BUN 19 8 - 23 mg/dL   Creatinine, Ser 1.66 (H) 0.61 - 1.24 mg/dL   Calcium 8.9 8.9 - 10.3 mg/dL   Total Protein 6.5 6.5 - 8.1 g/dL   Albumin 2.7 (L) 3.5 - 5.0 g/dL   AST 19 15 - 41 U/L   ALT 17 0 - 44 U/L   Alkaline Phosphatase 72 38 - 126 U/L   Total Bilirubin 0.7 0.3 - 1.2 mg/dL   GFR calc non Af Amer 40 (L) >60 mL/min   GFR calc Af Amer 47 (L) >60 mL/min   Anion gap 10 5 - 15  Urinalysis, Routine w reflex microscopic  Result Value Ref Range   Color, Urine YELLOW YELLOW   APPearance HAZY (A) CLEAR   Specific Gravity, Urine 1.032 (H) 1.005 - 1.030   pH 6.0 5.0 - 8.0   Glucose, UA NEGATIVE NEGATIVE mg/dL   Hgb urine dipstick NEGATIVE NEGATIVE   Bilirubin Urine NEGATIVE NEGATIVE   Ketones, ur NEGATIVE NEGATIVE mg/dL   Protein, ur >=300 (A) NEGATIVE mg/dL   Nitrite NEGATIVE NEGATIVE   Leukocytes, UA TRACE (A) NEGATIVE   RBC / HPF 0-5 0 - 5 RBC/hpf   WBC, UA 21-50 0 - 5 WBC/hpf   Bacteria, UA RARE (A) NONE SEEN   Squamous Epithelial / LPF 0-5 0 - 5   Mucus PRESENT    Trichomonas, UA PRESENT (A)  NONE SEEN   Hyaline Casts, UA PRESENT     EKG None  Radiology No results found. Ct Abdomen Pelvis W Contrast  Result Date: 08/13/2018 CLINICAL DATA:  Nausea, abdominal pain without bowel movement for 3 days. History of perirectal abscess, prostatomegaly. EXAM: CT ABDOMEN AND PELVIS WITH CONTRAST TECHNIQUE: Multidetector CT imaging of the abdomen and pelvis was performed using the standard protocol following bolus administration of intravenous contrast. CONTRAST:  115mL OMNIPAQUE IOHEXOL 300 MG/ML  SOLN COMPARISON:  CT abdomen and pelvis April 09, 2017 FINDINGS: LOWER CHEST: Minimal dependent atelectasis. Included heart size is normal. No pericardial effusion. HEPATOBILIARY: Liver and gallbladder are normal. PANCREAS: Normal. SPLEEN: Normal. ADRENALS/URINARY TRACT: Kidneys are orthotopic, demonstrating symmetric enhancement. No nephrolithiasis, hydronephrosis or solid renal masses. The unopacified ureters are normal in course and caliber. Delayed imaging through the kidneys demonstrates symmetric prompt contrast excretion within the proximal urinary collecting system. Urinary bladder is partially distended and non suspicious. Stable bilateral adrenal nodules measuring to 2.4 cm on the LEFT, 10 Hounsfield units on delayed phase with greater than 50% washout compatible with benign adenomas. STOMACH/BOWEL: Edematous, thickened gastric walls with fat stranding. The small and large bowel are normal in course and caliber without inflammatory changes, sensitivity decreased without oral contrast. Mild colonic diverticulosis. Mild amount of retained large bowel stool. Normal appendix. VASCULAR/LYMPHATIC: Aortoiliac vessels are normal in course and caliber. Mild intimal thickening calcific atherosclerosis. No lymphadenopathy by CT size criteria. REPRODUCTIVE: 5.8 x 6.2 cm prostate with significant filled bladder invasion. OTHER: No intraperitoneal free fluid or free air. Small bilobed fat containing umbilical  hernia. 18 mm LEFT paraspinal sebaceous cyst. MUSCULOSKELETAL: Nonacute. Old moderate T12 compression  fracture without further height loss. Minimal old L2 compression fracture. Severe lower lumbar facet arthropathy. Severe L4-5 and L5-S1 spondylosis. Severe canal stenosis L3-4. Multilevel severe neural foraminal narrowing. IMPRESSION: 1. Acute gastritis without complication. Mild amount of retained large bowel stool without bowel obstruction. 2. Stable prostatomegaly invading base of bladder. 3. Stable bilateral adrenal nodules compatible with adenomas. 4. Severe canal stenosis L3-4. Multilevel severe neural foraminal narrowing. Aortic Atherosclerosis (ICD10-I70.0). Electronically Signed   By: Elon Alas M.D.   On: 08/13/2018 04:59    Procedures Procedures (including critical care time)  Medications Ordered in ED Medications - No data to display   Initial Impression / Assessment and Plan / ED Course  I have reviewed the triage vital signs and the nursing notes.  Pertinent labs & imaging results that were available during my care of the patient were reviewed by me and considered in my medical decision making (see chart for details).     Patient to ED with complaint of diffuse abdominal pain and constipation having no BM in 3-4 days. No vomiting. He continues to eat and drink. No fever.   Labs and CT imaging ordered. The patient has been sleeping on multiple rechecks. Given likelihood of constipation and history of substance abuse and multiple recent positive drug screens, narcotic are avoided.   5:15 - CT scan results show uncomplicated gastritis and constipation without obstruction. These results are reviewed with Dr. Leonette Monarch.   6:00 - Patient awake and sitting up. He is having belching and complains of generalized abdominal pain as on initial presentation. IV Reglan provided. Discussed plan to discharge home with recommended treatments for constipation, including Miralax up to TID,  stool softener, Mag Citrate and enema.   The patient is felt appropriate for discharge home.    Final Clinical Impressions(s) / ED Diagnoses   Final diagnoses:  None   1. Diffuse abdominal pain 2. Constipation 3. Gastritis 4. Substance abuse  ED Discharge Orders    None       Charlann Lange, PA-C 08/13/18 1975    Fatima Blank, MD 08/13/18 [phone]

## 2018-08-13 NOTE — Discharge Instructions (Addendum)
Several medications to relieve your constipation have been recommended. Use them as directed. A prescription for Reglan for abdominal cramping has been sent to your pharmacy.   It is important to follow up with your doctor for recheck in 2 days.   Return to the emergency department if you develop high fever, severe pain, uncontrolled vomiting or bleeding per rectum.

## 2018-08-14 LAB — URINE CULTURE

## 2018-08-16 ENCOUNTER — Other Ambulatory Visit: Payer: Self-pay

## 2018-08-16 ENCOUNTER — Emergency Department (HOSPITAL_COMMUNITY): Payer: Medicare Other

## 2018-08-16 ENCOUNTER — Encounter (HOSPITAL_COMMUNITY): Payer: Self-pay | Admitting: Emergency Medicine

## 2018-08-16 ENCOUNTER — Emergency Department (HOSPITAL_COMMUNITY)
Admission: EM | Admit: 2018-08-16 | Discharge: 2018-08-16 | Disposition: A | Discharge status: Home / Self Care | Payer: Medicare Other | Attending: Emergency Medicine | Admitting: Emergency Medicine

## 2018-08-16 DIAGNOSIS — F17210 Nicotine dependence, cigarettes, uncomplicated: Secondary | ICD-10-CM | POA: Diagnosis not present

## 2018-08-16 DIAGNOSIS — E119 Type 2 diabetes mellitus without complications: Secondary | ICD-10-CM | POA: Diagnosis not present

## 2018-08-16 DIAGNOSIS — K5900 Constipation, unspecified: Secondary | ICD-10-CM

## 2018-08-16 DIAGNOSIS — I10 Essential (primary) hypertension: Secondary | ICD-10-CM | POA: Insufficient documentation

## 2018-08-16 MED ORDER — SENNOSIDES-DOCUSATE SODIUM 8.6-50 MG PO TABS: 1.0000 | ORAL_TABLET | Freq: Two times a day (BID) | ORAL | 0 refills | Status: AC

## 2018-08-16 NOTE — ED Triage Notes (Addendum)
Patient reports constipation and abdominal pain. Seen yesterday at [facility] for same. Last BM x1 week. Reports taking OTC laxatives. Denies N/V.

## 2018-08-16 NOTE — ED Provider Notes (Signed)
[facility] DEPT Provider Note  CSN: [id number] Arrival date & time: 08/16/18  1627  History   Chief Complaint Chief Complaint  Patient presents with  . Constipation   HPI Tim Bradley is a 67 y.o. male with a medical history of HTN, chronic back pain, BPH, Type 2 DM who presented to the ED for The history is provided by the patient and a caregiver.  Constipation   This is a chronic problem. The current episode started more than 2 days ago. The stool is described as firm and formed. Associated symptoms include abdominal pain. Pertinent negatives include no flatus and no dysuria. There is no fiber in the patient's diet. He does not exercise regularly. There has not been adequate water intake. Treatments tried: Colace. The treatment provided no relief.   Additional history obtained from medical chart. Patient seen in the ED on 08/13/18 for the same complaint. CT abdomen shows gastritis and constipation. Discharged with supportive treatment recommendations.  Past Medical History:  Diagnosis Date  . Abscess 02/2017   LEFT GLUTEAL   . Acid reflux   . DDD (degenerative disc disease), lumbar   . Diabetes mellitus without complication ([place])   . Enlarged prostate   . Hypertension   . Spinal stenosis, lumbar     Patient Active Problem List   Diagnosis Date Noted  . Abscess, gluteal, left 03/26/2017  . Type 2 diabetes mellitus ([place]) 03/26/2017  . Adrenal nodule ([place]) 03/26/2017  . Closed fracture of body of thoracic vertebra ([place]) 03/26/2017  . Spinal stenosis of lumbar region 03/26/2017    Past Surgical History:  Procedure Laterality Date  . BACK SURGERY     cervical x2  . INCISION AND DRAINAGE PERIRECTAL ABSCESS N/A 03/26/2017   Procedure: IRRIGATION AND DEBRIDEMENT PERIRECTAL ABSCESS;  Surgeon: Rolm Bookbinder, MD;  Location: [facility];  Service: General;  Laterality: N/A;        Home Medications    Prior to Admission medications     Medication Sig Start Date End Date Taking? Authorizing Provider  docusate sodium (COLACE) 250 MG capsule Take 1 capsule (250 mg total) by mouth daily. 08/13/18   Charlann Lange, PA-C  HYDROcodone-acetaminophen (NORCO/VICODIN) 5-325 MG tablet Take 1-2 tablets by mouth every 6 (six) hours as needed for moderate pain. Patient not taking: Reported on 03/26/2017 02/25/17   Fredia Sorrow, MD  metFORMIN (GLUCOPHAGE) 500 MG tablet Take 500 mg by mouth 2 (two) times daily with a meal.    [provider]  methocarbamol (ROBAXIN) 500 MG tablet Take 1 tablet (500 mg total) by mouth 2 (two) times daily. Patient not taking: Reported on 03/26/2017 08/03/14   Carlisle Cater, PA-C  metoCLOPramide (REGLAN) 10 MG tablet Take 1 tablet (10 mg total) by mouth every 6 (six) hours. 08/13/18   Charlann Lange, PA-C  oxyCODONE-acetaminophen (PERCOCET/ROXICET) 5-325 MG per tablet Take 1-2 tablets by mouth every 6 (six) hours as needed for severe pain. Patient not taking: Reported on 03/26/2017 08/03/14   Carlisle Cater, PA-C  oxyCODONE-acetaminophen (PERCOCET/ROXICET) 5-325 MG tablet Take 1 tablet by mouth every 4 (four) hours as needed for moderate pain or severe pain. Patient not taking: Reported on 04/09/2017 03/28/17   Fanny Skates, MD  polyethylene glycol [facility]) packet Use one dose up to three times daily, maximum 3 consecutive days 08/13/18   Charlann Lange, PA-C  ranitidine (ZANTAC) 150 MG tablet Take 150 mg by mouth daily.     [provider]  tamsulosin (FLOMAX) 0.4 MG  CAPS capsule Take 0.4 mg by mouth daily.    [provider]  traMADol (ULTRAM) 50 MG tablet Take 50 mg by mouth 3 (three) times daily. for pain 04/20/18   [provider]  triamterene-hydrochlorothiazide (MAXZIDE) 75-50 MG per tablet Take 1 tablet by mouth daily.    [provider]    Family History Family History  Problem Relation Age of Onset  . Hypertension Mother   . Cancer - Lung Father      Social History Social History   Tobacco Use  . Smoking status: Current Every Day Smoker    Packs/day: 0.25    Types: Cigarettes  . Smokeless tobacco: Never Used  Substance Use Topics  . Alcohol use: Yes    Alcohol/week: 12.0 standard drinks    Types: 12 Cans of beer per week    Comment: i can of beer daily  . Drug use: Yes    Types: Cocaine, Marijuana    Comment: cocaine last use Sunday 05/02/18     Allergies   Patient has no known allergies.   Review of Systems Review of Systems  Constitutional: Negative for fever.  Gastrointestinal: Positive for abdominal pain and constipation. Negative for blood in stool, diarrhea, flatus, nausea, rectal pain and vomiting.  Genitourinary: Negative for dysuria.  All other systems reviewed and are negative.    Physical Exam Updated Vital Signs BP (!) 166/98 (BP Location: Right Arm)   Pulse 69   Temp 98 F (36.7 C) (Oral)   Resp 14   Ht 6\' 2"  (1.88 m)   Wt 99.8 kg   SpO2 98%   BMI 28.25 kg/m   Physical Exam  Constitutional:  Sitting upright on stool uncomfortable.  Cardiovascular: Normal rate, regular rhythm and normal heart sounds.  Pulmonary/Chest: Effort normal and breath sounds normal.  Abdominal: Soft. Bowel sounds are normal.  Endorsed left side tenderness to palpation.  Genitourinary: Prostate is enlarged.  Genitourinary Comments: RN chaperone present. No stool impaction in rectal vault appreciated due to prostate size.   Skin: Skin is warm and intact. Capillary refill takes 2 to 3 seconds.  Nursing note and vitals reviewed.  ED Treatments / Results  Labs (all labs ordered are listed, but only abnormal results are displayed) Labs Reviewed - No data to display  EKG None  Radiology No results found.  Procedures Procedures (including critical care time)  Medications Ordered in ED Medications - No data to display   Initial Impression / Assessment and Plan / ED Course  Triage vital signs and the  nursing notes have been reviewed.  Pertinent labs & imaging results that were available during care of the patient were reviewed and considered in medical decision making (see chart for details).  Patient presents to the ED with complaints of constipation that has been present > 5 days. Patient states he has taken 8 Colace tablets since he was seen in the ED on 08/13/18 for the same complaint. He does not report any worsening in the constipation, but states it has not improved with stool softener. Abdomen is non-distended, soft with normal bowel sounds which is reassuring. He endorses left sided abdominal tenderness and appears to be in discomfort. CT abdomen ordered at last ED visit. Will order acute abdomen x-ray. Plans to perform enema today for acute evacuation. Clinical Course as of Aug 16 2340  Mon Aug 16, 2018  2042 Patient hypertensive in triage. He carries a diagnosis of HTN, but states he has not taken anti-hypertensives today.  No s/s of end organ damage that warrant further evaluation today.   [GM]  2114 Nonobstructive pattern seen on abdominal x-ray. Enema performed for acute evacuation of stool burden. Patient reports significant relief after enema.   [GM]    Clinical Course User Index [GM] Clarance Bollard, Jonelle Sports, PA-C     Final Clinical Impressions(s) / ED Diagnoses  1. Constipation. Relieved with enema. Rx for Senokot prescribed. Education provided on OTC and supportive treatment for relief. Advised to follow-up with PCP if constipation continues to be an issue. Education provided on red flag s/s that warrant return to the ED vs PCP.  Dispo: Home. After thorough clinical evaluation, this patient is determined to be medically stable and can be safely discharged with the previously mentioned treatment and/or outpatient follow-up/referral(s). At this time, there are no other apparent medical conditions that require further screening, evaluation or treatment.   Final diagnoses:   Constipation, unspecified constipation type    ED Discharge Orders         Ordered    senna-docusate (SENOKOT-S) 8.6-50 MG tablet  2 times daily     08/16/18 2243            Junita Push 08/16/18 2342    Dorie Rank, MD 08/17/18 [phone]

## 2018-08-16 NOTE — Discharge Instructions (Addendum)
I'm glad we were able to provide you with some relief today.   I have prescribed you Senokot which is a combination stool softener and laxative. You may take this twice a day. Continue to take the Miralax as well. Please be sure to stay hydrated and drink plenty of water.  Please follow-up with your PCP soon to discuss this.  Thank you for allowing [place] to take care of you today. Take care!

## 2018-08-17 ENCOUNTER — Emergency Department (HOSPITAL_COMMUNITY): Payer: Medicare Other

## 2018-08-17 ENCOUNTER — Emergency Department (HOSPITAL_COMMUNITY)
Admission: EM | Admit: 2018-08-17 | Discharge: 2018-08-17 | Disposition: A | Discharge status: Home / Self Care | Payer: Medicare Other | Attending: Emergency Medicine | Admitting: Emergency Medicine

## 2018-08-17 ENCOUNTER — Encounter (HOSPITAL_COMMUNITY): Payer: Self-pay

## 2018-08-17 ENCOUNTER — Other Ambulatory Visit: Payer: Self-pay

## 2018-08-17 ENCOUNTER — Emergency Department (HOSPITAL_COMMUNITY)
Admission: EM | Admit: 2018-08-17 | Discharge: 2018-08-17 | Disposition: A | Discharge status: Home / Self Care | Payer: Non-veteran care | Attending: Emergency Medicine | Admitting: Emergency Medicine

## 2018-08-17 ENCOUNTER — Encounter (HOSPITAL_COMMUNITY): Payer: Self-pay | Admitting: Emergency Medicine

## 2018-08-17 DIAGNOSIS — R1084 Generalized abdominal pain: Secondary | ICD-10-CM | POA: Diagnosis not present

## 2018-08-17 DIAGNOSIS — Z7984 Long term (current) use of oral hypoglycemic drugs: Secondary | ICD-10-CM | POA: Diagnosis not present

## 2018-08-17 DIAGNOSIS — I10 Essential (primary) hypertension: Secondary | ICD-10-CM | POA: Insufficient documentation

## 2018-08-17 DIAGNOSIS — F17210 Nicotine dependence, cigarettes, uncomplicated: Secondary | ICD-10-CM | POA: Insufficient documentation

## 2018-08-17 DIAGNOSIS — E119 Type 2 diabetes mellitus without complications: Secondary | ICD-10-CM | POA: Insufficient documentation

## 2018-08-17 DIAGNOSIS — R1013 Epigastric pain: Secondary | ICD-10-CM | POA: Insufficient documentation

## 2018-08-17 DIAGNOSIS — Z79899 Other long term (current) drug therapy: Secondary | ICD-10-CM | POA: Insufficient documentation

## 2018-08-17 DIAGNOSIS — R109 Unspecified abdominal pain: Secondary | ICD-10-CM | POA: Diagnosis present

## 2018-08-17 LAB — CBC WITH DIFFERENTIAL/PLATELET
Abs Immature Granulocytes: 0.07 10*3/uL (ref 0.00–0.07)
Basophils Absolute: 0.1 10*3/uL (ref 0.0–0.1)
Basophils Relative: 1 %
EOS PCT: 1 %
Eosinophils Absolute: 0.1 10*3/uL (ref 0.0–0.5)
HEMATOCRIT: 44.1 % (ref 39.0–52.0)
Hemoglobin: 14.4 g/dL (ref 13.0–17.0)
Immature Granulocytes: 1 %
LYMPHS ABS: 2.1 10*3/uL (ref 0.7–4.0)
Lymphocytes Relative: 19 %
MCH: 28.5 pg (ref 26.0–34.0)
MCHC: 32.7 g/dL (ref 30.0–36.0)
MCV: 87.3 fL (ref 80.0–100.0)
MONO ABS: 1 10*3/uL (ref 0.1–1.0)
MONOS PCT: 9 %
Neutro Abs: 7.4 10*3/uL (ref 1.7–7.7)
Neutrophils Relative %: 69 %
Platelets: 475 10*3/uL — ABNORMAL HIGH (ref 150–400)
RBC: 5.05 MIL/uL (ref 4.22–5.81)
RDW: 11.6 % (ref 11.5–15.5)
WBC: 10.7 10*3/uL — ABNORMAL HIGH (ref 4.0–10.5)
nRBC: 0 % (ref 0.0–0.2)

## 2018-08-17 LAB — COMPREHENSIVE METABOLIC PANEL
ALBUMIN: 3.1 g/dL — AB (ref 3.5–5.0)
ALK PHOS: 74 U/L (ref 38–126)
ALT: 18 U/L (ref 0–44)
AST: 23 U/L (ref 15–41)
Anion gap: 11 (ref 5–15)
BUN: 20 mg/dL (ref 8–23)
CALCIUM: 9.1 mg/dL (ref 8.9–10.3)
CHLORIDE: 92 mmol/L — AB (ref 98–111)
CO2: 24 mmol/L (ref 22–32)
CREATININE: 1.61 mg/dL — AB (ref 0.61–1.24)
GFR calc Af Amer: 49 mL/min — ABNORMAL LOW (ref >60)
GFR calc non Af Amer: 42 mL/min — ABNORMAL LOW (ref >60)
GLUCOSE: 139 mg/dL — AB (ref 70–99)
Potassium: 4.7 mmol/L (ref 3.5–5.1)
SODIUM: 127 mmol/L — AB (ref 135–145)
Total Bilirubin: 0.9 mg/dL (ref 0.3–1.2)
Total Protein: 7.7 g/dL (ref 6.5–8.1)

## 2018-08-17 LAB — URINALYSIS, ROUTINE W REFLEX MICROSCOPIC
Bilirubin Urine: NEGATIVE
Glucose, UA: NEGATIVE mg/dL
Ketones, ur: NEGATIVE mg/dL
Nitrite: NEGATIVE
PROTEIN: 100 mg/dL — AB
SPECIFIC GRAVITY, URINE: 1.008 (ref 1.005–1.030)
pH: 6 (ref 5.0–8.0)

## 2018-08-17 LAB — LIPASE, BLOOD: Lipase: 51 U/L (ref 11–51)

## 2018-08-17 LAB — I-STAT CG4 LACTIC ACID, ED: LACTIC ACID, VENOUS: 1.45 mmol/L (ref 0.5–1.9)

## 2018-08-17 MED ORDER — PANTOPRAZOLE SODIUM 40 MG PO TBEC: 40.0000 mg | DELAYED_RELEASE_TABLET | Freq: Every day | ORAL | 0 refills | Status: DC

## 2018-08-17 MED ORDER — DICYCLOMINE HCL 20 MG PO TABS: 20.0000 mg | ORAL_TABLET | Freq: Three times a day (TID) | ORAL | 0 refills | Status: DC

## 2018-08-17 MED ORDER — LACTULOSE 10 GM/15ML PO SOLN: 20.0000 g | Freq: Two times a day (BID) | ORAL | 0 refills | Status: DC | PRN

## 2018-08-17 MED ORDER — ALUM & MAG HYDROXIDE-SIMETH 200-200-20 MG/5ML PO SUSP
15.0000 mL | Freq: Once | ORAL | Status: AC
  Administered 2018-08-17: 15 mL via ORAL
  Filled 2018-08-17: qty 30

## 2018-08-17 MED ORDER — ACETAMINOPHEN 500 MG PO TABS
1000.0000 mg | ORAL_TABLET | Freq: Once | ORAL | Status: AC
  Administered 2018-08-17: 1000 mg via ORAL
  Filled 2018-08-17: qty 2

## 2018-08-17 MED ORDER — HYDROMORPHONE HCL 1 MG/ML IJ SOLN
0.5000 mg | Freq: Once | INTRAMUSCULAR | Status: AC
  Administered 2018-08-17: 0.5 mg via INTRAVENOUS
  Filled 2018-08-17: qty 1

## 2018-08-17 MED ORDER — ONDANSETRON HCL 4 MG/2ML IJ SOLN
4.0000 mg | Freq: Once | INTRAMUSCULAR | Status: AC
  Administered 2018-08-17: 4 mg via INTRAVENOUS
  Filled 2018-08-17: qty 2

## 2018-08-17 MED ORDER — SODIUM CHLORIDE 0.9 % IV BOLUS
500.0000 mL | Freq: Once | INTRAVENOUS | Status: AC
  Administered 2018-08-17: 500 mL via INTRAVENOUS

## 2018-08-17 MED ORDER — FAMOTIDINE 20 MG PO TABS
20.0000 mg | ORAL_TABLET | Freq: Once | ORAL | Status: AC
  Administered 2018-08-17: 20 mg via ORAL
  Filled 2018-08-17: qty 1

## 2018-08-17 NOTE — ED Triage Notes (Signed)
Patient is complaining of abdominal pain. Patient was here earlier for the same symptoms. Patient states that woke up and was having abdominal pain all over. Patient walks with a cane at baseline. Patient was not given anything by EMS. Patient is from home.

## 2018-08-17 NOTE — ED Notes (Signed)
Bed: WTR5 Expected date:  Expected time:  Means of arrival:  Comments: 

## 2018-08-17 NOTE — ED Notes (Signed)
Pt made aware that he could not get up alone and pt verified he understood. Pt has call bell to call staff if help is needed.

## 2018-08-17 NOTE — ED Notes (Signed)
Bed: WA19 Expected date:  Expected time:  Means of arrival:  Comments: EMS 

## 2018-08-17 NOTE — ED Notes (Signed)
Patient was awakened by security approx 0800 today. Patient came into triage wanting coffee at that time. Patient had been sleeping in the lobby. Patient was redirected to the lobby. Patient checked himself in with c/o abdominal pain. When writer went to retrieve the patient from the lobby to go to Triage 5 he had 4-5 empty coffee cups and 3/4 eaten sandwich, shoes off and a blanket where he had been lying. Dr. Ashok Cordia notified of the situation.

## 2018-08-17 NOTE — ED Provider Notes (Signed)
[facility] DEPT Provider Note   CSN: [id number] Arrival date & time: 08/17/18  0219     History   Chief Complaint Chief Complaint  Patient presents with  . Abdominal Pain    HPI Tim Bradley is a 67 y.o. male.  Patient presents to the emergency department for evaluation of abdominal pain.  Patient seen in the ER earlier tonight.  He was evaluated and diagnosed with constipation, provided an enema.  He reports that since he went home he has started having increased abdominal pain.  He reports diffuse, sharp and stabbing pain all over.  He still feels constipated, like he might need another enema.     Past Medical History:  Diagnosis Date  . Abscess 02/2017   LEFT GLUTEAL   . Acid reflux   . DDD (degenerative disc disease), lumbar   . Diabetes mellitus without complication ([place])   . Enlarged prostate   . Hypertension   . Spinal stenosis, lumbar     Patient Active Problem List   Diagnosis Date Noted  . Abscess, gluteal, left 03/26/2017  . Type 2 diabetes mellitus ([place]) 03/26/2017  . Adrenal nodule ([place]) 03/26/2017  . Closed fracture of body of thoracic vertebra ([place]) 03/26/2017  . Spinal stenosis of lumbar region 03/26/2017    Past Surgical History:  Procedure Laterality Date  . BACK SURGERY     cervical x2  . INCISION AND DRAINAGE PERIRECTAL ABSCESS N/A 03/26/2017   Procedure: IRRIGATION AND DEBRIDEMENT PERIRECTAL ABSCESS;  Surgeon: Rolm Bookbinder, MD;  Location: [facility];  Service: General;  Laterality: N/A;        Home Medications    Prior to Admission medications   Medication Sig Start Date End Date Taking? Authorizing Provider  metFORMIN (GLUCOPHAGE) 500 MG tablet Take 500 mg by mouth 2 (two) times daily with a meal.   Yes [provider]  polyethylene glycol (MIRALAX) packet Use one dose up to three times daily, maximum 3 consecutive days Patient taking differently: Take 17 g by mouth 3 (three) times daily as  needed for mild constipation. Use one dose up to three times daily, maximum 3 consecutive days 08/13/18  Yes Charlann Lange, PA-C  tamsulosin (FLOMAX) 0.4 MG CAPS capsule Take 0.4 mg by mouth daily.   Yes [provider]  traMADol (ULTRAM) 50 MG tablet Take 50 mg by mouth 3 (three) times daily. for pain 04/20/18  Yes [provider]  triamterene-hydrochlorothiazide (MAXZIDE) 75-50 MG per tablet Take 1 tablet by mouth daily.   Yes [provider]  dicyclomine (BENTYL) 20 MG tablet Take 1 tablet (20 mg total) by mouth 3 (three) times daily before meals. 08/17/18   Orpah Greek, MD  docusate sodium (COLACE) 250 MG capsule Take 1 capsule (250 mg total) by mouth daily. 08/13/18   Charlann Lange, PA-C  lactulose (CHRONULAC) 10 GM/15ML solution Take 30 mLs (20 g total) by mouth 2 (two) times daily as needed for moderate constipation. Use this instead of MiraLAX 08/17/18   Pollina, Gwenyth Allegra, MD  metoCLOPramide (REGLAN) 10 MG tablet Take 1 tablet (10 mg total) by mouth every 6 (six) hours. 08/13/18   Charlann Lange, PA-C  senna-docusate (SENOKOT-S) 8.6-50 MG tablet Take 1 tablet by mouth 2 (two) times daily for 10 days. 08/16/18 08/26/18  Mortis, Jonelle Sports, PA-C    Family History Family History  Problem Relation Age of Onset  . Hypertension Mother   . Cancer - Lung Father  Social History Social History   Tobacco Use  . Smoking status: Current Every Day Smoker    Packs/day: 0.25    Types: Cigarettes  . Smokeless tobacco: Never Used  Substance Use Topics  . Alcohol use: Yes    Alcohol/week: 12.0 standard drinks    Types: 12 Cans of beer per week    Comment: i can of beer daily  . Drug use: Yes    Types: Cocaine, Marijuana    Comment: cocaine last use Sunday 05/02/18     Allergies   Patient has no known allergies.   Review of Systems Review of Systems  Gastrointestinal: Positive for abdominal pain.  All other systems reviewed and are  negative.    Physical Exam Updated Vital Signs BP 136/83   Pulse 93   Temp 98.1 F (36.7 C) (Oral)   Resp (!) 21   Ht 6\' 2"  (1.88 m)   Wt 90.7 kg   SpO2 91%   BMI 25.68 kg/m   Physical Exam  Constitutional: He is oriented to person, place, and time. He appears well-developed and well-nourished. No distress.  HENT:  Head: Normocephalic and atraumatic.  Right Ear: Hearing normal.  Left Ear: Hearing normal.  Nose: Nose normal.  Mouth/Throat: Oropharynx is clear and moist and mucous membranes are normal.  Eyes: Pupils are equal, round, and reactive to light. Conjunctivae and EOM are normal.  Neck: Normal range of motion. Neck supple.  Cardiovascular: Regular rhythm, S1 normal and S2 normal. Exam reveals no gallop and no friction rub.  No murmur heard. Pulmonary/Chest: Effort normal and breath sounds normal. No respiratory distress. He exhibits no tenderness.  Abdominal: Soft. Normal appearance and bowel sounds are normal. There is no hepatosplenomegaly. There is generalized tenderness. There is no rebound, no guarding, no tenderness at McBurney's point and negative Murphy's sign. No hernia.  Musculoskeletal: Normal range of motion.  Neurological: He is alert and oriented to person, place, and time. He has normal strength. No cranial nerve deficit or sensory deficit. Coordination normal. GCS eye subscore is 4. GCS verbal subscore is 5. GCS motor subscore is 6.  Skin: Skin is warm, dry and intact. No rash noted. No cyanosis.  Psychiatric: He has a normal mood and affect. His speech is normal and behavior is normal. Thought content normal.  Nursing note and vitals reviewed.    ED Treatments / Results  Labs (all labs ordered are listed, but only abnormal results are displayed) Labs Reviewed  CBC WITH DIFFERENTIAL/PLATELET - Abnormal; Notable for the following components:      Result Value   WBC 10.7 (*)    Platelets 475 (*)    All other components within normal limits    COMPREHENSIVE METABOLIC PANEL - Abnormal; Notable for the following components:   Sodium 127 (*)    Chloride 92 (*)    Glucose, Bld 139 (*)    Creatinine, Ser 1.61 (*)    Albumin 3.1 (*)    GFR calc non Af Amer 42 (*)    GFR calc Af Amer 49 (*)    All other components within normal limits  URINALYSIS, ROUTINE W REFLEX MICROSCOPIC - Abnormal; Notable for the following components:   Hgb urine dipstick SMALL (*)    Protein, ur 100 (*)    Leukocytes, UA MODERATE (*)    Bacteria, UA RARE (*)    All other components within normal limits  LIPASE, BLOOD  I-STAT CG4 LACTIC ACID, ED  I-STAT CG4 LACTIC ACID, ED  EKG None  Radiology Dg Abd Acute W/chest  Result Date: 08/17/2018 CLINICAL DATA:  Severe abdominal pain EXAM: DG ABDOMEN ACUTE W/ 1V CHEST COMPARISON:  Chest and abdominal radiograph 08/16/2018 FINDINGS: There is no evidence of dilated bowel loops or free intraperitoneal air. No radiopaque calculi or other significant radiographic abnormality is seen. Heart size and mediastinal contours are within normal limits. Both lungs are clear. IMPRESSION: Nonobstructed bowel gas pattern.  No acute cardiopulmonary disease. Electronically Signed   By: Ulyses Jarred M.D.   On: 08/17/2018 03:33   Dg Abdomen Acute W/chest  Result Date: 08/16/2018 CLINICAL DATA:  Constipation and abdominal pain. Seen yesterday for same. EXAM: DG ABDOMEN ACUTE W/ 1V CHEST COMPARISON:  CT abdomen and pelvis 08/13/2018 FINDINGS: Normal heart size and pulmonary vascularity. No focal airspace disease or consolidation in the lungs. No blunting of costophrenic angles. No pneumothorax. Mediastinal contours appear intact. Degenerative changes in the spine and shoulders. Postoperative change in the cervical spine. Tortuous aorta. Scattered gas and stool throughout the colon. No small or large bowel distention. No free intra-abdominal air. No abnormal air-fluid levels. No radiopaque stones. Degenerative changes in the lumbar  spine and hips. Vascular calcifications. IMPRESSION: No evidence of active pulmonary disease. Nonobstructive bowel gas pattern. Electronically Signed   By: Lucienne Capers M.D.   On: 08/16/2018 21:36    Procedures Procedures (including critical care time)  Medications Ordered in ED Medications  HYDROmorphone (DILAUDID) injection 0.5 mg (0.5 mg Intravenous Given 08/17/18 0406)  ondansetron (ZOFRAN) injection 4 mg (4 mg Intravenous Given 08/17/18 0406)  sodium chloride 0.9 % bolus 500 mL (0 mLs Intravenous Stopped 08/17/18 0602)     Initial Impression / Assessment and Plan / ED Course  I have reviewed the triage vital signs and the nursing notes.  Pertinent labs & imaging results that were available during my care of the patient were reviewed by me and considered in my medical decision making (see chart for details).     Patient presents to the emergency department for evaluation of abdominal pain.  Patient seen earlier tonight for the same symptoms.  He had an x-ray and a CT scan at that time.  CT scan showed only evidence of constipation, no acute pathology.  Patient has had lab work and urinalysis performed at this visit.  All lab work is normal including a lactic acid of 1.45.  No consideration for ischemic bowel based on CAT scan and lactic acid.  Repeat x-ray shows no change, no evidence of free air, obstruction, etc. after the enema was given.  I do not feel the patient requires any further work-up at this time.  Vital signs are all normal.    Final Clinical Impressions(s) / ED Diagnoses   Final diagnoses:  Generalized abdominal pain    ED Discharge Orders         Ordered    dicyclomine (BENTYL) 20 MG tablet  3 times daily before meals     08/17/18 0641    lactulose (CHRONULAC) 10 GM/15ML solution  2 times daily PRN     08/17/18 0641           Orpah Greek, MD 08/17/18 [phone]

## 2018-08-17 NOTE — ED Provider Notes (Signed)
[facility] DEPT Provider Note   CSN: [id number] Arrival date & time: 08/17/18  0831     History   Chief Complaint No chief complaint on file.   HPI Tim Bradley is a 67 y.o. male.  Patient c/o epigastric pain. Pain describes as gradual onset, constant, dull, non radiating. Patient had been seen in ED with same earlier this AM - he was discharged, but stayed in waiting room - nursing indicates drank several cups of coffee and ate [place] sandwich, and then decided to check back in this AM. Patient denies any acute or abrupt change in his pain. No vomiting. No fever or chills. Last bm yesterday. No chest pain or discomfort. No cough or uri symptoms.   The history is provided by the patient.    Past Medical History:  Diagnosis Date  . Abscess 02/2017   LEFT GLUTEAL   . Acid reflux   . DDD (degenerative disc disease), lumbar   . Diabetes mellitus without complication ([place])   . Enlarged prostate   . Hypertension   . Spinal stenosis, lumbar     Patient Active Problem List   Diagnosis Date Noted  . Abscess, gluteal, left 03/26/2017  . Type 2 diabetes mellitus ([place]) 03/26/2017  . Adrenal nodule ([place]) 03/26/2017  . Closed fracture of body of thoracic vertebra ([place]) 03/26/2017  . Spinal stenosis of lumbar region 03/26/2017    Past Surgical History:  Procedure Laterality Date  . BACK SURGERY     cervical x2  . INCISION AND DRAINAGE PERIRECTAL ABSCESS N/A 03/26/2017   Procedure: IRRIGATION AND DEBRIDEMENT PERIRECTAL ABSCESS;  Surgeon: Rolm Bookbinder, MD;  Location: [facility];  Service: General;  Laterality: N/A;        Home Medications    Prior to Admission medications   Medication Sig Start Date End Date Taking? Authorizing Provider  dicyclomine (BENTYL) 20 MG tablet Take 1 tablet (20 mg total) by mouth 3 (three) times daily before meals. 08/17/18   Orpah Greek, MD  docusate sodium (COLACE) 250 MG capsule Take 1 capsule (250 mg  total) by mouth daily. 08/13/18   Charlann Lange, PA-C  lactulose (CHRONULAC) 10 GM/15ML solution Take 30 mLs (20 g total) by mouth 2 (two) times daily as needed for moderate constipation. Use this instead of MiraLAX 08/17/18   Pollina, Gwenyth Allegra, MD  metFORMIN (GLUCOPHAGE) 500 MG tablet Take 500 mg by mouth 2 (two) times daily with a meal.    [provider]  metoCLOPramide (REGLAN) 10 MG tablet Take 1 tablet (10 mg total) by mouth every 6 (six) hours. 08/13/18   Charlann Lange, PA-C  polyethylene glycol (MIRALAX) packet Use one dose up to three times daily, maximum 3 consecutive days Patient taking differently: Take 17 g by mouth 3 (three) times daily as needed for mild constipation. Use one dose up to three times daily, maximum 3 consecutive days 08/13/18   Charlann Lange, PA-C  senna-docusate (SENOKOT-S) 8.6-50 MG tablet Take 1 tablet by mouth 2 (two) times daily for 10 days. 08/16/18 08/26/18  Mortis, Alvie Heidelberg I, PA-C  tamsulosin (FLOMAX) 0.4 MG CAPS capsule Take 0.4 mg by mouth daily.    [provider]  traMADol (ULTRAM) 50 MG tablet Take 50 mg by mouth 3 (three) times daily. for pain 04/20/18   [provider]  triamterene-hydrochlorothiazide (MAXZIDE) 75-50 MG per tablet Take 1 tablet by mouth daily.    [provider]    Family History Family History  Problem  Relation Age of Onset  . Hypertension Mother   . Cancer - Lung Father     Social History Social History   Tobacco Use  . Smoking status: Current Every Day Smoker    Packs/day: 0.25    Types: Cigarettes  . Smokeless tobacco: Never Used  Substance Use Topics  . Alcohol use: Yes    Alcohol/week: 12.0 standard drinks    Types: 12 Cans of beer per week    Comment: i can of beer daily  . Drug use: Yes    Types: Cocaine, Marijuana    Comment: cocaine last use Sunday 05/02/18     Allergies   Patient has no known allergies.   Review of Systems Review of Systems  Constitutional:  Negative for fever.  HENT: Negative for sore throat.   Eyes: Negative for redness.  Respiratory: Negative for shortness of breath.   Cardiovascular: Negative for chest pain.  Gastrointestinal: Positive for abdominal pain. Negative for vomiting.  Genitourinary: Negative for dysuria and flank pain.  Musculoskeletal: Negative for back pain and neck pain.  Skin: Negative for rash.  Neurological: Negative for headaches.  Hematological: Does not bruise/bleed easily.  Psychiatric/Behavioral: Negative for confusion.     Physical Exam Updated Vital Signs There were no vitals taken for this visit.  Physical Exam  Constitutional: He appears well-developed and well-nourished.  HENT:  Mouth/Throat: Oropharynx is clear and moist.  Eyes: Conjunctivae are normal. No scleral icterus.  Neck: Neck supple. No tracheal deviation present.  Cardiovascular: Normal rate, regular rhythm, normal heart sounds and intact distal pulses.  Pulmonary/Chest: Effort normal and breath sounds normal. No accessory muscle usage. No respiratory distress.  Abdominal: Soft. Bowel sounds are normal. He exhibits no distension and no mass. There is no tenderness. There is no rebound and no guarding. No hernia.  Genitourinary:  Genitourinary Comments: No cva tenderness.   Musculoskeletal: He exhibits no edema.  Neurological: He is alert.  Skin: Skin is warm and dry. No rash noted.  Psychiatric: He has a normal mood and affect.  Nursing note and vitals reviewed.    ED Treatments / Results  Labs (all labs ordered are listed, but only abnormal results are displayed) Labs Reviewed - No data to display  EKG None  Radiology Dg Abd Acute W/chest  Result Date: 08/17/2018 CLINICAL DATA:  Severe abdominal pain EXAM: DG ABDOMEN ACUTE W/ 1V CHEST COMPARISON:  Chest and abdominal radiograph 08/16/2018 FINDINGS: There is no evidence of dilated bowel loops or free intraperitoneal air. No radiopaque calculi or other significant  radiographic abnormality is seen. Heart size and mediastinal contours are within normal limits. Both lungs are clear. IMPRESSION: Nonobstructed bowel gas pattern.  No acute cardiopulmonary disease. Electronically Signed   By: Ulyses Jarred M.D.   On: 08/17/2018 03:33   Dg Abdomen Acute W/chest  Result Date: 08/16/2018 CLINICAL DATA:  Constipation and abdominal pain. Seen yesterday for same. EXAM: DG ABDOMEN ACUTE W/ 1V CHEST COMPARISON:  CT abdomen and pelvis 08/13/2018 FINDINGS: Normal heart size and pulmonary vascularity. No focal airspace disease or consolidation in the lungs. No blunting of costophrenic angles. No pneumothorax. Mediastinal contours appear intact. Degenerative changes in the spine and shoulders. Postoperative change in the cervical spine. Tortuous aorta. Scattered gas and stool throughout the colon. No small or large bowel distention. No free intra-abdominal air. No abnormal air-fluid levels. No radiopaque stones. Degenerative changes in the lumbar spine and hips. Vascular calcifications. IMPRESSION: No evidence of active pulmonary disease. Nonobstructive bowel gas pattern.  Electronically Signed   By: Lucienne Capers M.D.   On: 08/16/2018 21:36    Procedures Procedures (including critical care time)  Medications Ordered in ED Medications  famotidine (PEPCID) tablet 20 mg (has no administration in time range)  alum & mag hydroxide-simeth (MAALOX/MYLANTA) 200-200-20 MG/5ML suspension 15 mL (has no administration in time range)  acetaminophen (TYLENOL) tablet 1,000 mg (has no administration in time range)     Initial Impression / Assessment and Plan / ED Course  I have reviewed the triage vital signs and the nursing notes.  Pertinent labs & imaging results that were available during my care of the patient were reviewed by me and considered in my medical decision making (see chart for details).  Reviewed nursing notes and prior charts for additional history.  Pt had labs done  earlier today - lipase normal. Pt had abd series earlier today - no sbo. For abd pain, pt with ct 11/15 - ?gastritis then.   pepcid po, maalox po, and acetaminophen - given for symptom relief.  Pt is eating and drinking. abd exam benign.   rx for acid blocker therapy and encouraged for close pcp f/u.  Pt currently appears stable for d/c.     Final Clinical Impressions(s) / ED Diagnoses   Final diagnoses:  None    ED Discharge Orders    None       Lajean Saver, MD 08/23/18 1600

## 2018-08-17 NOTE — Discharge Instructions (Addendum)
It was our pleasure to provide your ER care today - we hope that you feel better.  Take protonix (acid blocker medication). You may also try pepcid and maalox for symptom relief.  If constipated, get adequate fiber in diet, and take colace 2x/day and miralax 1x/day as need - these two medications as available over the counter.   Follow up with primary care doctor in the next 1-2 days for recheck. From earlier lab tests, your sodium level was mildly low - follow up with primary care doctor for recheck this week.   Return to ER if worse, new symptoms, fevers, persistent vomiting, new or severe pain, other concern.

## 2018-08-20 ENCOUNTER — Other Ambulatory Visit: Payer: Self-pay

## 2018-08-20 ENCOUNTER — Emergency Department (HOSPITAL_COMMUNITY): Payer: Medicare Other

## 2018-08-20 ENCOUNTER — Inpatient Hospital Stay (HOSPITAL_COMMUNITY)
Admission: EM | Admit: 2018-08-20 | Discharge: 2018-08-22 | DRG: 384 | Disposition: A | Discharge status: Home / Self Care | Payer: Medicare Other | Attending: Internal Medicine | Admitting: Internal Medicine

## 2018-08-20 ENCOUNTER — Encounter (HOSPITAL_COMMUNITY): Payer: Self-pay | Admitting: Emergency Medicine

## 2018-08-20 DIAGNOSIS — K259 Gastric ulcer, unspecified as acute or chronic, without hemorrhage or perforation: Principal | ICD-10-CM | POA: Diagnosis present

## 2018-08-20 DIAGNOSIS — Z79899 Other long term (current) drug therapy: Secondary | ICD-10-CM

## 2018-08-20 DIAGNOSIS — K2970 Gastritis, unspecified, without bleeding: Secondary | ICD-10-CM | POA: Diagnosis present

## 2018-08-20 DIAGNOSIS — N183 Chronic kidney disease, stage 3 (moderate): Secondary | ICD-10-CM | POA: Diagnosis present

## 2018-08-20 DIAGNOSIS — D62 Acute posthemorrhagic anemia: Secondary | ICD-10-CM

## 2018-08-20 DIAGNOSIS — D649 Anemia, unspecified: Secondary | ICD-10-CM

## 2018-08-20 DIAGNOSIS — K5900 Constipation, unspecified: Secondary | ICD-10-CM | POA: Diagnosis not present

## 2018-08-20 DIAGNOSIS — K2971 Gastritis, unspecified, with bleeding: Secondary | ICD-10-CM | POA: Diagnosis present

## 2018-08-20 DIAGNOSIS — I129 Hypertensive chronic kidney disease with stage 1 through stage 4 chronic kidney disease, or unspecified chronic kidney disease: Secondary | ICD-10-CM | POA: Diagnosis present

## 2018-08-20 DIAGNOSIS — K219 Gastro-esophageal reflux disease without esophagitis: Secondary | ICD-10-CM | POA: Diagnosis present

## 2018-08-20 DIAGNOSIS — Z23 Encounter for immunization: Secondary | ICD-10-CM

## 2018-08-20 DIAGNOSIS — Z7984 Long term (current) use of oral hypoglycemic drugs: Secondary | ICD-10-CM

## 2018-08-20 DIAGNOSIS — M5136 Other intervertebral disc degeneration, lumbar region: Secondary | ICD-10-CM | POA: Diagnosis present

## 2018-08-20 DIAGNOSIS — R195 Other fecal abnormalities: Secondary | ICD-10-CM

## 2018-08-20 DIAGNOSIS — R1013 Epigastric pain: Secondary | ICD-10-CM

## 2018-08-20 DIAGNOSIS — E861 Hypovolemia: Secondary | ICD-10-CM | POA: Diagnosis present

## 2018-08-20 DIAGNOSIS — M48061 Spinal stenosis, lumbar region without neurogenic claudication: Secondary | ICD-10-CM | POA: Diagnosis present

## 2018-08-20 DIAGNOSIS — N400 Enlarged prostate without lower urinary tract symptoms: Secondary | ICD-10-CM | POA: Diagnosis present

## 2018-08-20 DIAGNOSIS — E871 Hypo-osmolality and hyponatremia: Secondary | ICD-10-CM

## 2018-08-20 DIAGNOSIS — F17210 Nicotine dependence, cigarettes, uncomplicated: Secondary | ICD-10-CM | POA: Diagnosis present

## 2018-08-20 DIAGNOSIS — Z87891 Personal history of nicotine dependence: Secondary | ICD-10-CM

## 2018-08-20 DIAGNOSIS — I447 Left bundle-branch block, unspecified: Secondary | ICD-10-CM | POA: Diagnosis present

## 2018-08-20 DIAGNOSIS — Z8249 Family history of ischemic heart disease and other diseases of the circulatory system: Secondary | ICD-10-CM

## 2018-08-20 DIAGNOSIS — E1122 Type 2 diabetes mellitus with diabetic chronic kidney disease: Secondary | ICD-10-CM | POA: Diagnosis present

## 2018-08-20 LAB — CBC WITH DIFFERENTIAL/PLATELET
ABS IMMATURE GRANULOCYTES: 0.05 10*3/uL (ref 0.00–0.07)
BASOS PCT: 0 %
Basophils Absolute: 0 10*3/uL (ref 0.0–0.1)
Eosinophils Absolute: 0.1 10*3/uL (ref 0.0–0.5)
Eosinophils Relative: 1 %
HCT: 38.1 % — ABNORMAL LOW (ref 39.0–52.0)
Hemoglobin: 11.9 g/dL — ABNORMAL LOW (ref 13.0–17.0)
IMMATURE GRANULOCYTES: 1 %
Lymphocytes Relative: 14 %
Lymphs Abs: 1.5 10*3/uL (ref 0.7–4.0)
MCH: 27.9 pg (ref 26.0–34.0)
MCHC: 31.2 g/dL (ref 30.0–36.0)
MCV: 89.4 fL (ref 80.0–100.0)
MONOS PCT: 7 %
Monocytes Absolute: 0.7 10*3/uL (ref 0.1–1.0)
NEUTROS ABS: 8.1 10*3/uL — AB (ref 1.7–7.7)
NEUTROS PCT: 77 %
PLATELETS: 484 10*3/uL — AB (ref 150–400)
RBC: 4.26 MIL/uL (ref 4.22–5.81)
RDW: 11.8 % (ref 11.5–15.5)
WBC: 10.5 10*3/uL (ref 4.0–10.5)
nRBC: 0 % (ref 0.0–0.2)

## 2018-08-20 LAB — COMPREHENSIVE METABOLIC PANEL
ALT: 20 U/L (ref 0–44)
AST: 21 U/L (ref 15–41)
Albumin: 2.3 g/dL — ABNORMAL LOW (ref 3.5–5.0)
Alkaline Phosphatase: 62 U/L (ref 38–126)
Anion gap: 10 (ref 5–15)
BUN: 31 mg/dL — ABNORMAL HIGH (ref 8–23)
CALCIUM: 9.2 mg/dL (ref 8.9–10.3)
CO2: 23 mmol/L (ref 22–32)
CREATININE: 1.66 mg/dL — AB (ref 0.61–1.24)
Chloride: 97 mmol/L — ABNORMAL LOW (ref 98–111)
GFR, EST AFRICAN AMERICAN: 47 mL/min — AB (ref >60)
GFR, EST NON AFRICAN AMERICAN: 40 mL/min — AB (ref >60)
Glucose, Bld: 171 mg/dL — ABNORMAL HIGH (ref 70–99)
Potassium: 3.9 mmol/L (ref 3.5–5.1)
Sodium: 130 mmol/L — ABNORMAL LOW (ref 135–145)
Total Bilirubin: 0.7 mg/dL (ref 0.3–1.2)
Total Protein: 6.9 g/dL (ref 6.5–8.1)

## 2018-08-20 LAB — POC OCCULT BLOOD, ED: Fecal Occult Bld: POSITIVE — AB

## 2018-08-20 LAB — RAPID URINE DRUG SCREEN, HOSP PERFORMED
AMPHETAMINES: NOT DETECTED
Barbiturates: NOT DETECTED
Benzodiazepines: NOT DETECTED
Cocaine: POSITIVE — AB
Opiates: NOT DETECTED
Tetrahydrocannabinol: NOT DETECTED

## 2018-08-20 LAB — LIPASE, BLOOD: LIPASE: 32 U/L (ref 11–51)

## 2018-08-20 LAB — TROPONIN I

## 2018-08-20 LAB — I-STAT TROPONIN, ED: Troponin i, poc: 0 ng/mL (ref 0.00–0.08)

## 2018-08-20 LAB — GLUCOSE, CAPILLARY
GLUCOSE-CAPILLARY: 175 mg/dL — AB (ref 70–99)
GLUCOSE-CAPILLARY: 173 mg/dL — AB (ref 70–99)

## 2018-08-20 LAB — I-STAT CG4 LACTIC ACID, ED: LACTIC ACID, VENOUS: 1.62 mmol/L (ref 0.5–1.9)

## 2018-08-20 MED ORDER — IOPAMIDOL (ISOVUE-370) INJECTION 76%
100.0000 mL | Freq: Once | INTRAVENOUS | Status: AC | PRN
  Administered 2018-08-20: 100 mL via INTRAVENOUS

## 2018-08-20 MED ORDER — PANTOPRAZOLE SODIUM 40 MG IV SOLR
40.0000 mg | Freq: Once | INTRAVENOUS | Status: AC
  Administered 2018-08-20: 40 mg via INTRAVENOUS
  Filled 2018-08-20: qty 40

## 2018-08-20 MED ORDER — SODIUM CHLORIDE 0.9 % IV BOLUS
1000.0000 mL | Freq: Once | INTRAVENOUS | Status: AC
  Administered 2018-08-20: 1000 mL via INTRAVENOUS

## 2018-08-20 MED ORDER — ALUM & MAG HYDROXIDE-SIMETH 200-200-20 MG/5ML PO SUSP
30.0000 mL | Freq: Once | ORAL | Status: AC
  Administered 2018-08-20: 30 mL via ORAL
  Filled 2018-08-20: qty 30

## 2018-08-20 MED ORDER — INSULIN ASPART 100 UNIT/ML ~~LOC~~ SOLN
0.0000 [IU] | Freq: Three times a day (TID) | SUBCUTANEOUS | Status: DC
  Administered 2018-08-20: 3 [IU] via SUBCUTANEOUS
  Administered 2018-08-21: 2 [IU] via SUBCUTANEOUS
  Administered 2018-08-22: 3 [IU] via SUBCUTANEOUS

## 2018-08-20 MED ORDER — SODIUM CHLORIDE 0.9 % IV SOLN
INTRAVENOUS | Status: DC
  Administered 2018-08-20: 23:00:00 via INTRAVENOUS

## 2018-08-20 MED ORDER — NICOTINE 7 MG/24HR TD PT24
7.0000 mg | MEDICATED_PATCH | Freq: Every day | TRANSDERMAL | Status: DC
  Administered 2018-08-20 – 2018-08-22 (×3): 7 mg via TRANSDERMAL
  Filled 2018-08-20 (×3): qty 1

## 2018-08-20 MED ORDER — HYDROMORPHONE HCL 1 MG/ML IJ SOLN
0.5000 mg | Freq: Once | INTRAMUSCULAR | Status: DC
  Filled 2018-08-20: qty 1

## 2018-08-20 MED ORDER — PANTOPRAZOLE SODIUM 40 MG PO TBEC
40.0000 mg | DELAYED_RELEASE_TABLET | Freq: Every day | ORAL | Status: DC
  Administered 2018-08-21: 40 mg via ORAL
  Filled 2018-08-20: qty 1

## 2018-08-20 MED ORDER — PANTOPRAZOLE SODIUM 40 MG IV SOLR: 40.0000 mg | Freq: Once | INTRAVENOUS | Status: DC

## 2018-08-20 MED ORDER — SUCRALFATE 1 G PO TABS
1.0000 g | ORAL_TABLET | Freq: Every day | ORAL | Status: DC
  Administered 2018-08-21: 1 g via ORAL
  Filled 2018-08-20: qty 1

## 2018-08-20 MED ORDER — SODIUM CHLORIDE 0.9 % IV SOLN
8.0000 mg/h | INTRAVENOUS | Status: DC
  Administered 2018-08-20: 8 mg/h via INTRAVENOUS
  Filled 2018-08-20 (×2): qty 80

## 2018-08-20 MED ORDER — METOCLOPRAMIDE HCL 5 MG/ML IJ SOLN: 10.0000 mg | Freq: Once | INTRAMUSCULAR | Status: DC

## 2018-08-20 MED ORDER — TAMSULOSIN HCL 0.4 MG PO CAPS
0.4000 mg | ORAL_CAPSULE | Freq: Every day | ORAL | Status: DC
  Administered 2018-08-20 – 2018-08-22 (×3): 0.4 mg via ORAL
  Filled 2018-08-20 (×3): qty 1

## 2018-08-20 MED ORDER — SENNOSIDES-DOCUSATE SODIUM 8.6-50 MG PO TABS: 1.0000 | ORAL_TABLET | Freq: Every evening | ORAL | Status: DC | PRN

## 2018-08-20 MED ORDER — LIDOCAINE VISCOUS HCL 2 % MT SOLN
15.0000 mL | Freq: Once | OROMUCOSAL | Status: DC
  Filled 2018-08-20: qty 15

## 2018-08-20 MED ORDER — INSULIN ASPART 100 UNIT/ML ~~LOC~~ SOLN: 0.0000 [IU] | Freq: Every day | SUBCUTANEOUS | Status: DC

## 2018-08-20 MED ORDER — PANTOPRAZOLE SODIUM 40 MG IV SOLR: 40.0000 mg | Freq: Two times a day (BID) | INTRAVENOUS | Status: DC

## 2018-08-20 MED ORDER — SUCRALFATE 1 G PO TABS
1.0000 g | ORAL_TABLET | Freq: Once | ORAL | Status: AC
  Administered 2018-08-20: 1 g via ORAL
  Filled 2018-08-20: qty 1

## 2018-08-20 MED ORDER — IOPAMIDOL (ISOVUE-370) INJECTION 76%
INTRAVENOUS | Status: AC
  Filled 2018-08-20: qty 100

## 2018-08-20 MED ORDER — ACETAMINOPHEN 325 MG PO TABS: 650.0000 mg | ORAL_TABLET | Freq: Four times a day (QID) | ORAL | Status: DC | PRN

## 2018-08-20 MED ORDER — ACETAMINOPHEN 650 MG RE SUPP: 650.0000 mg | Freq: Four times a day (QID) | RECTAL | Status: DC | PRN

## 2018-08-20 NOTE — Consult Note (Signed)
Referring Provider: Emergency room provider Primary Care Physician:  System, Pcp Not In Primary Gastroenterologist: Unassigned  Reason for Consultation: Abdominal pain  HPI: Tim Bradley is a 67 y.o. male admitted to the hospital for further evaluation of abdominal pain.  He has been having intermittent abdominal pain for last 2 weeks.  Describes pain as left upper quadrant abdominal pain radiating towards epigastric area and then towards right upper quadrant.  Associated with nausea and nonbloody vomiting.  Abdominal pain is sharp in nature and worse with the smoking cigarette.  Pain not associate with the food.  Complaining of constipation but denies any diarrhea.  Denies any blood in the stool or black stool.  No previous EGD or colonoscopy.  No family history of colon cancer  Past Medical History:  Diagnosis Date  . Abscess 02/2017   LEFT GLUTEAL   . Acid reflux   . DDD (degenerative disc disease), lumbar   . Diabetes mellitus without complication ([place])   . Enlarged prostate   . Hypertension   . Spinal stenosis, lumbar     Past Surgical History:  Procedure Laterality Date  . BACK SURGERY     cervical x2  . INCISION AND DRAINAGE PERIRECTAL ABSCESS N/A 03/26/2017   Procedure: IRRIGATION AND DEBRIDEMENT PERIRECTAL ABSCESS;  Surgeon: Rolm Bookbinder, MD;  Location: [facility];  Service: General;  Laterality: N/A;    Prior to Admission medications   Medication Sig Start Date End Date Taking? Authorizing Provider  metFORMIN (GLUCOPHAGE) 500 MG tablet Take 500 mg by mouth 2 (two) times daily with a meal.   Yes [provider]  polyethylene glycol (MIRALAX) packet Use one dose up to three times daily, maximum 3 consecutive days Patient taking differently: Take 17 g by mouth 3 (three) times daily as needed for mild constipation. Use one dose up to three times daily, maximum 3 consecutive days 08/13/18  Yes Upstill, Shari, PA-C  senna-docusate (SENOKOT-S) 8.6-50 MG tablet Take 1  tablet by mouth 2 (two) times daily for 10 days. 08/16/18 08/26/18 Yes Mortis, Jonelle Sports, PA-C  tamsulosin (FLOMAX) 0.4 MG CAPS capsule Take 0.4 mg by mouth daily.   Yes [provider]  traMADol (ULTRAM) 50 MG tablet Take 50 mg by mouth 3 (three) times daily. for pain 04/20/18  Yes [provider]  triamterene-hydrochlorothiazide (MAXZIDE) 75-50 MG per tablet Take 1 tablet by mouth daily.   Yes [provider]  dicyclomine (BENTYL) 20 MG tablet Take 1 tablet (20 mg total) by mouth 3 (three) times daily before meals. 08/17/18   Orpah Greek, MD  docusate sodium (COLACE) 250 MG capsule Take 1 capsule (250 mg total) by mouth daily. 08/13/18   Charlann Lange, PA-C  lactulose (CHRONULAC) 10 GM/15ML solution Take 30 mLs (20 g total) by mouth 2 (two) times daily as needed for moderate constipation. Use this instead of MiraLAX Patient not taking: Reported on 08/20/2018 08/17/18   Orpah Greek, MD  metoCLOPramide (REGLAN) 10 MG tablet Take 1 tablet (10 mg total) by mouth every 6 (six) hours. Patient not taking: Reported on 08/20/2018 08/13/18   Charlann Lange, PA-C  pantoprazole (PROTONIX) 40 MG tablet Take 1 tablet (40 mg total) by mouth daily. 08/17/18   Lajean Saver, MD    Scheduled Meds: . alum & mag hydroxide-simeth  30 mL Oral Once   And  . lidocaine  15 mL Oral Once  . tamsulosin  0.4 mg Oral Daily   Continuous Infusions: PRN Meds:.acetaminophen **OR** acetaminophen, senna-docusate  Allergies as of 08/20/2018  . (No Known Allergies)    Family History  Problem Relation Age of Onset  . Hypertension Mother   . Cancer - Lung Father     Social History   Socioeconomic History  . Marital status: Widowed    Spouse name: Not on file  . Number of children: 0  . Years of education: Not on file  . Highest education level: High school graduate  Occupational History  . Occupation: retired  Scientific laboratory technician  . Financial resource strain: Somewhat  hard  . Food insecurity:    Worry: Never true    Inability: Never true  . Transportation needs:    Medical: Yes    Non-medical: No  Tobacco Use  . Smoking status: Current Every Day Smoker    Packs/day: 0.25    Types: Cigarettes  . Smokeless tobacco: Never Used  Substance and Sexual Activity  . Alcohol use: Yes    Alcohol/week: 12.0 standard drinks    Types: 12 Cans of beer per week    Comment: i can of beer daily  . Drug use: Yes    Types: Cocaine, Marijuana    Comment: cocaine last use Sunday 05/02/18  . Sexual activity: Not on file  Lifestyle  . Physical activity:    Days per week: 0 days    Minutes per session: 0 min  . Stress: Only a little  Relationships  . Social connections:    Talks on phone: More than three times a week    Gets together: Once a week    Attends religious service: Never    Active member of club or organization: No    Attends meetings of clubs or organizations: Never    Relationship status: Widowed  . Intimate partner violence:    Fear of current or ex partner: No    Emotionally abused: No    Physically abused: No    Forced sexual activity: No  Other Topics Concern  . Not on file  Social History Narrative   [place] veteran    Review of Systems: Review of Systems  Constitutional: Negative for chills and fever.  HENT: Negative for hearing loss and tinnitus.   Eyes: Negative for blurred vision and double vision.  Respiratory: Negative for cough and hemoptysis.   Cardiovascular: Negative for chest pain and palpitations.  Gastrointestinal: Positive for abdominal pain, constipation, nausea and vomiting. Negative for heartburn.  Genitourinary: Negative for dysuria and urgency.  Musculoskeletal: Negative for myalgias and neck pain.  Skin: Negative for rash.  Neurological: Negative for seizures and loss of consciousness.  Endo/Heme/Allergies: Does not bruise/bleed easily.  Psychiatric/Behavioral: Negative for hallucinations and suicidal ideas.     Physical Exam: Vital signs: Vitals:   08/20/18 1030 08/20/18 1408  BP: (!) 146/89 134/88  Pulse: 88 90  Resp: (!) 21 18  Temp:  98.8 F (37.1 C)  SpO2: 100% 97%     Physical Exam  Constitutional: He is oriented to person, place, and time. He appears well-developed and well-nourished.  HENT:  Head: Normocephalic and atraumatic.  Mouth/Throat: Oropharynx is clear and moist. No oropharyngeal exudate.  Eyes: EOM are normal. No scleral icterus.  Neck: Normal range of motion. Neck supple. No thyromegaly present.  Cardiovascular: Normal rate, regular rhythm and normal heart sounds.  Pulmonary/Chest: Effort normal and breath sounds normal. No respiratory distress.  Abdominal: Soft. Bowel sounds are normal. He exhibits no distension. There is no tenderness. There is no rebound and no guarding.  Musculoskeletal:  Normal range of motion. He exhibits no edema.  Neurological: He is alert and oriented to person, place, and time.  Skin: Skin is warm. No erythema.  Psychiatric: He has a normal mood and affect. Judgment normal.  Vitals reviewed.   GI:  Lab Results: Recent Labs    08/20/18 0626  WBC 10.5  HGB 11.9*  HCT 38.1*  PLT 484*   BMET Recent Labs    08/20/18 0626  NA 130*  K 3.9  CL 97*  CO2 23  GLUCOSE 171*  BUN 31*  CREATININE 1.66*  CALCIUM 9.2   LFT Recent Labs    08/20/18 0626  PROT 6.9  ALBUMIN 2.3*  AST 21  ALT 20  ALKPHOS 62  BILITOT 0.7   PT/INR No results for input(s): LABPROT, INR in the last 72 hours.   Studies/Results: Ct Angio Abd/pel W And/or Wo Contrast  Result Date: 08/20/2018 CLINICAL DATA:  Acute abdominal pain with cramping EXAM: CT ANGIOGRAPHY ABDOMEN AND PELVIS WITH CONTRAST AND WITHOUT CONTRAST TECHNIQUE: Multidetector CT imaging of the abdomen and pelvis was performed using the standard protocol during bolus administration of intravenous contrast. Multiplanar reconstructed images and MIPs were obtained and reviewed to evaluate  the vascular anatomy. CONTRAST:  165mL ISOVUE-370 IOPAMIDOL (ISOVUE-370) INJECTION 76% COMPARISON:  08/13/2018 FINDINGS: VASCULAR Aorta: Aortoiliac atherosclerosis noted without significant aneurysm, occlusion, dissection, retroperitoneal hemorrhage. Celiac: Widely patent origin including its branches. Left gastric and splenic branches are patent. Hepatic vasculature and gastroduodenal branches originate from the SMA. SMA: Widely patent origin. Hepatic vasculature including the gastroduodenal artery originates off of the SMA. SMA main trunk and its branches throughout the mesentery widely patent. Renals: Atherosclerotic origins but remain patent. No accessory renal artery. IMA: Remains patent off the distal aorta including its branches Inflow: Iliac atherosclerosis noted without occlusion or inflow disease. The common, internal external iliac arteries remain patent. No aneurysm, dissection or acute vascular process. Proximal Outflow: Atherosclerosis of the patent common femoral, proximal profunda femoral, and proximal superficial femoral arteries without occlusion. Veins: No veno-occlusive process Review of the MIP images confirms the above findings. NON-VASCULAR Lower chest: Minor dependent basilar atelectasis. Normal heart size. No pericardial or pleural effusion. Small hiatal hernia noted. Hepatobiliary: No focal liver abnormality is seen. No gallstones, gallbladder wall thickening, or biliary dilatation. Pancreas: Unremarkable. No pancreatic ductal dilatation or surrounding inflammatory changes. Spleen: Normal in size without focal abnormality. Adrenals/Urinary Tract: Stable hypodense adrenal nodules, compatible with adenomas by prior imaging. No renal obstruction, hydronephrosis, or surrounding inflammatory process. No hydroureter or ureteral calculus. Bladder unremarkable. Prostate gland is enlarged impressing upon the bladder base. Stomach/Bowel: Similar nonspecific wall prominence of the stomach with mucosal  enhancement suggesting gastritis as before. Negative for obstruction, significant dilatation, ileus, free air. Normal appearing appendix. Negative for fluid collection, ascites, or abscess. Lymphatic: No adenopathy. Reproductive: Markedly enlarged heterogeneous prostate impressing upon the bladder base. Other: No abdominal wall hernia or abnormality. No abdominopelvic ascites. Musculoskeletal: Advanced degenerative changes of the spine. Chronic T12 compression fracture. No acute osseous finding by CT. IMPRESSION: VASCULAR Nonocclusive atherosclerosis. Patent mesenteric and renal vasculature. No evidence of mesenteric occlusive disease by CT. Complete replacement of the hepatic and gastroduodenal arterial vasculature from the SMA, normal variant. Aortoiliac atherosclerosis without significant aneurysm or occlusive disease. NON-VASCULAR Nonspecific gastric wall prominence with mucosal enhancement can be seen with gastritis. As previously described 08/13/2018. Stable adrenal adenomas Marked prostate enlargement No other acute intra-abdominal or pelvic finding. Electronically Signed   By: Jerilynn Mages.  Shick M.D.  On: 08/20/2018 08:39    Impression/Plan: -Abdominal pain with a CT scan showing nonspecific gastric wall thickening.  Patient's pain is worse with the smoking and not associate with food.  Normal lipase.  Normal LFTs.  Multiple ER visit for abdominal pain. -Occult Blood positive stool  Recommendations -------------------------- - Continue  Protonix -EGD tomorrow for further evaluation. NPO PM  - Recommend Outpatient colonoscopy.  Risks (bleeding, infection, bowel perforation that could require surgery, sedation-related changes in cardiopulmonary systems), benefits (identification and possible treatment of source of symptoms, exclusion of certain causes of symptoms), and alternatives (watchful waiting, radiographic imaging studies, empiric medical treatment)  were explained to patient in detail and patient  wishes to proceed.    LOS: 0 days   Otis Brace  MD, FACP 08/20/2018, 3:08 PM  Contact #  [phone]

## 2018-08-20 NOTE — ED Notes (Signed)
GI at bedside

## 2018-08-20 NOTE — ED Triage Notes (Signed)
BIB EMS from home. C/o generalized abd pain, cramping with constipation for over 1 week now. Seen for same recently w/o relief. Began having N/V around 0200 tonight.

## 2018-08-20 NOTE — H&P (Addendum)
Date: 08/20/2018               Patient Name:  Tim Bradley MRN: [id number]  DOB: 1951-01-04 Age / Sex: 67 y.o., male   PCP: System, Pcp Not In         Medical Service: Internal Medicine Teaching Service         Attending Physician: Dr. Oval Linsey, MD    First Contact: Dr. Sharon Seller Pager: [phone]  Second Contact: Dr. Frederico Hamman Pager: [phone]       After Hours (After 5p/  First Contact Pager: [phone]  weekends / holidays): Second Contact Pager: [phone]   Chief Complaint: Abdominal pain  History of Present Illness: Mr. Tim Bradley is a 67 year old male with past medical history of hypertension, diabetes and benign prostatic hyperplasia that presents to the emergency department for a 2 week history of abdominal pain that he describes as sharp and diffuse throughout the abdomen. He states he has not had a bowel movement in 2-1/2 weeks. He had one episode of nonbloody, nonbilious emesis this morning.  He states the abdominal pain is relieved by morphine and worse after eating.  He denies NSAID use or alcohol use.  He reports several visits to the ED in the past week and states he has not been able to pick up prescriptions given to him after each encounter.  He denies fever/chills, chest pain, shortness of breath or leg swelling.    Patient has presented to the ED on 4 separate occasions in the past week for abdominal pain.  During these encounters he has had imaging of his abdomen including a CT scan of abdomen and pelvis 33/82 showing uncomplicated gastritis with constipation.  He also had an enema in the ED with a bowel movement noted 11/18.  Meds:  Current Meds  Medication Sig  . metFORMIN (GLUCOPHAGE) 500 MG tablet Take 500 mg by mouth 2 (two) times daily with a meal.  . polyethylene glycol (MIRALAX) packet Use one dose up to three times daily, maximum 3 consecutive days (Patient taking differently: Take 17 g by mouth 3 (three) times daily as needed for mild constipation. Use one  dose up to three times daily, maximum 3 consecutive days)  . senna-docusate (SENOKOT-S) 8.6-50 MG tablet Take 1 tablet by mouth 2 (two) times daily for 10 days.  . tamsulosin (FLOMAX) 0.4 MG CAPS capsule Take 0.4 mg by mouth daily.  . traMADol (ULTRAM) 50 MG tablet Take 50 mg by mouth 3 (three) times daily. for pain  . triamterene-hydrochlorothiazide (MAXZIDE) 75-50 MG per tablet Take 1 tablet by mouth daily.     Allergies: Allergies as of 08/20/2018  . (No Known Allergies)   Past Medical History:  Diagnosis Date  . Abscess 02/2017   LEFT GLUTEAL   . Acid reflux   . DDD (degenerative disc disease), lumbar   . Diabetes mellitus without complication ([place])   . Enlarged prostate   . Hypertension   . Spinal stenosis, lumbar     Family History:  Family History  Problem Relation Age of Onset  . Hypertension Mother   . Cancer - Lung Father      Social History: Tobacco use: 20-pack-year smoking history Alcohol use: Denies Illicit drug use: Denies  Review of Systems: A complete ROS was negative except as per HPI.   Physical Exam: Blood pressure 134/88, pulse 90, temperature 98.8 F (37.1 C), temperature source Oral, resp. rate 18, height 6\' 2"  (1.88 m), weight 90.7 kg,  SpO2 97 %. Physical Exam  Constitutional: He is oriented to person, place, and time and well-developed, well-nourished, and in no distress.  HENT:  Head: Normocephalic and atraumatic.  Eyes: Pupils are equal, round, and reactive to light. Conjunctivae are normal.  Neck: Normal range of motion.  Cardiovascular: Normal rate, regular rhythm and normal heart sounds. Exam reveals no gallop and no friction rub.  No murmur heard. Abdominal: Soft. Bowel sounds are normal. He exhibits no distension and no mass. There is no tenderness. There is no rebound and no guarding.  Musculoskeletal: He exhibits no edema.  Neurological: He is alert and oriented to person, place, and time.  Skin: Skin is warm and dry.    EKG:  personally reviewed my interpretation is normal sinus rhythm, new LBBB  Assessment & Plan by Problem: Active Problems:   Gastritis  Gastritis Patient had a CT angiogram of the abdomen and pelvis with and without contrast today.  It noted nonspecific gastric wall prominence with mucosal enhancement as previously seen on previous image on 11/15.  Lactic acid was normal.  Patient denies alcohol and NSAID use.  Does drink coffee.  Does report acid reflux after eating.  Will need further discussion on lifestyle modifications with diet. Patient currently denies abdominal pain. He received Carafate in the ED with improvement in pain. Will treat gastritis with PPI and Carafate daily.  GI was consulted in the ED.   -PPI -Carafate -GI cocktail - f/u GI recs  Constipation No stool burden was noted on imaging today. Unclear if patient is constipated as it is reported he had a bowel movement with an enema this week by EDP.  Patient states he has not had a bowel movement in over 2 weeks. -Senakot   Normocytic anemia Patient has been evaluated in the ED on several occasions for abdominal pain. His hemoglobin on 11/19 was 14 and today was 11. FOBT was positive.  GI was consulted by the EDP and recommendations included monitoring hemoglobin overnight. -CBC in the morning  EKG changes New LBBB noted on EKG this admission.  Initial troponin negative.  Denies chest pain currently.  Will need ischemic workup outpatient.    Hyponatremia Sodium of 130 on admission. Appears chronic.  -bmet in the am  DM II -SSI  BPH -continue home flomax  CKD III Creatinine stable  Dispo: Admit patient to Observation with expected length of stay less than 2 midnights.  Signed: Valinda Party, DO 08/20/2018, 2:23 PM  Pager: [phone]

## 2018-08-20 NOTE — ED Notes (Signed)
ED Provider at bedside. 

## 2018-08-20 NOTE — ED Provider Notes (Signed)
[facility] EMERGENCY DEPARTMENT Provider Note   CSN: [id number] Arrival date & time: 08/20/18  2542     History   Chief Complaint Chief Complaint  Patient presents with  . Abdominal Pain    HPI Tim Bradley is a 67 y.o. male.  HPI 67 year old male, diabetic, presents with abdominal pain.  Patient states has had the symptoms for 1 to 2 weeks.  This is patient's 5th visit for this complaint in the last week.  He states persistent constipation.  He notes one episode of vomiting this morning at approximately 2 AM.  He denies any change in his abdominal pain, new or worsening abdominal pain.  He states only a small amount of stool.  He denies any hematochezia, melena, hematemesis, fevers, chills, chest pain, shortness of breath.  Past Medical History:  Diagnosis Date  . Abscess 02/2017   LEFT GLUTEAL   . Acid reflux   . DDD (degenerative disc disease), lumbar   . Diabetes mellitus without complication ([place])   . Enlarged prostate   . Hypertension   . Spinal stenosis, lumbar     Patient Active Problem List   Diagnosis Date Noted  . Abscess, gluteal, left 03/26/2017  . Type 2 diabetes mellitus ([place]) 03/26/2017  . Adrenal nodule ([place]) 03/26/2017  . Closed fracture of body of thoracic vertebra ([place]) 03/26/2017  . Spinal stenosis of lumbar region 03/26/2017    Past Surgical History:  Procedure Laterality Date  . BACK SURGERY     cervical x2  . INCISION AND DRAINAGE PERIRECTAL ABSCESS N/A 03/26/2017   Procedure: IRRIGATION AND DEBRIDEMENT PERIRECTAL ABSCESS;  Surgeon: Rolm Bookbinder, MD;  Location: [facility];  Service: General;  Laterality: N/A;        Home Medications    Prior to Admission medications   Medication Sig Start Date End Date Taking? Authorizing Provider  dicyclomine (BENTYL) 20 MG tablet Take 1 tablet (20 mg total) by mouth 3 (three) times daily before meals. 08/17/18   Orpah Greek, MD  docusate sodium (COLACE) 250 MG capsule  Take 1 capsule (250 mg total) by mouth daily. 08/13/18   Charlann Lange, PA-C  lactulose (CHRONULAC) 10 GM/15ML solution Take 30 mLs (20 g total) by mouth 2 (two) times daily as needed for moderate constipation. Use this instead of MiraLAX 08/17/18   Pollina, Gwenyth Allegra, MD  metFORMIN (GLUCOPHAGE) 500 MG tablet Take 500 mg by mouth 2 (two) times daily with a meal.    [provider]  metoCLOPramide (REGLAN) 10 MG tablet Take 1 tablet (10 mg total) by mouth every 6 (six) hours. 08/13/18   Charlann Lange, PA-C  pantoprazole (PROTONIX) 40 MG tablet Take 1 tablet (40 mg total) by mouth daily. 08/17/18   Lajean Saver, MD  polyethylene glycol [facility]) packet Use one dose up to three times daily, maximum 3 consecutive days Patient taking differently: Take 17 g by mouth 3 (three) times daily as needed for mild constipation. Use one dose up to three times daily, maximum 3 consecutive days 08/13/18   Charlann Lange, PA-C  senna-docusate (SENOKOT-S) 8.6-50 MG tablet Take 1 tablet by mouth 2 (two) times daily for 10 days. 08/16/18 08/26/18  Mortis, Alvie Heidelberg I, PA-C  tamsulosin (FLOMAX) 0.4 MG CAPS capsule Take 0.4 mg by mouth daily.    [provider]  traMADol (ULTRAM) 50 MG tablet Take 50 mg by mouth 3 (three) times daily. for pain 04/20/18   [provider]  triamterene-hydrochlorothiazide (MAXZIDE) 75-50 MG per  tablet Take 1 tablet by mouth daily.    [provider]    Family History Family History  Problem Relation Age of Onset  . Hypertension Mother   . Cancer - Lung Father     Social History Social History   Tobacco Use  . Smoking status: Current Every Day Smoker    Packs/day: 0.25    Types: Cigarettes  . Smokeless tobacco: Never Used  Substance Use Topics  . Alcohol use: Yes    Alcohol/week: 12.0 standard drinks    Types: 12 Cans of beer per week    Comment: i can of beer daily  . Drug use: Yes    Types: Cocaine, Marijuana    Comment: cocaine  last use Sunday 05/02/18     Allergies   Patient has no known allergies.   Review of Systems Review of Systems  Constitutional: Negative for chills and fever.  Respiratory: Negative for shortness of breath.   Cardiovascular: Negative for chest pain.  Gastrointestinal: Positive for abdominal pain, constipation, nausea and vomiting. Negative for diarrhea.  Genitourinary: Negative for dysuria.     Physical Exam Updated Vital Signs BP (!) 150/88 (BP Location: Left Arm)   Pulse (!) 105   Temp 98.8 F (37.1 C) (Oral)   Resp (!) 23   Ht 6\' 2"  (1.88 m)   Wt 90.7 kg   SpO2 99%   BMI 25.67 kg/m   Physical Exam  Constitutional: He is oriented to person, place, and time. He appears well-developed and well-nourished.  HENT:  Head: Normocephalic and atraumatic.  Eyes: Conjunctivae and EOM are normal.  Neck: Neck supple.  Cardiovascular: Normal rate, regular rhythm and normal heart sounds.  No murmur heard. Pulmonary/Chest: Effort normal and breath sounds normal. No respiratory distress. He has no wheezes. He has no rales.  Abdominal: Soft. Bowel sounds are normal. He exhibits no distension. There is generalized tenderness. A hernia (umbilical, easily reduced) is present.  Musculoskeletal: Normal range of motion. He exhibits no tenderness or deformity.  Neurological: He is alert and oriented to person, place, and time.  Skin: Skin is warm and dry. No rash noted. No erythema.  Psychiatric: He has a normal mood and affect. His behavior is normal.  Nursing note and vitals reviewed.    ED Treatments / Results  Labs (all labs ordered are listed, but only abnormal results are displayed) Labs Reviewed  CBC WITH DIFFERENTIAL/PLATELET  COMPREHENSIVE METABOLIC PANEL  LIPASE, BLOOD  I-STAT CG4 LACTIC ACID, ED    EKG None  Radiology No results found.  Procedures Procedures (including critical care time)  Medications Ordered in ED Medications  sodium chloride 0.9 % bolus 1,000  mL (has no administration in time range)     Initial Impression / Assessment and Plan / ED Course  I have reviewed the triage vital signs and the nursing notes.  Pertinent labs & imaging results that were available during my care of the patient were reviewed by me and considered in my medical decision making (see chart for details).  Clinical Course as of Aug 20 900  Fri Aug 20, 2018  0848 CT Angio Abd/Pel W and/or Wo Contrast [CK]  0855 Resting comfortably in bed, no acute distress, nontoxic, non-lethargic.  Vital signs stable.  Patient's abdomen is soft and nontender to palpation.  He states he is feeling better after Protonix.  He has not had any vomiting in the ED.  Will p.o. challenge and give 1 dose of Carafate.  Patient states  Bentyl at home has been helping.  He has not been taking the Protonix previously prescribed to him.  CT angios shows possible gastritis, no evidence of mesenteric ischemia.   [CK]    Clinical Course User Index [CK] Kimyatta Lecy S, PA-C    Pt seen and evaluated by Dr. Ellender Hose. He commends adding a troponin, EKG and CT angios abdomen and pelvis.  Troponin negative.  EKG with left bundle branch block.  Patient has not been taking his Protonix as prescribed.  CT shows likely gastritis.  No evidence of mesenteric ischemia, small bowel obstruction, perforation, or abdominal abscess.  Hemoglobin dropped from 14.4-11.9 in the last 3 days, BUN increased, guaiac positive.  Protonix bolus and drip started.  Will discuss with GI for consult and the hospitalist for admission.    10:32 AM Discussed with the GI on-call, Dr. Paulita Fujita.  No emergent interventions by GI at this time.  Will discuss with hospitalist for observation of hgb.  Final Clinical Impressions(s) / ED Diagnoses   Final diagnoses:  None    ED Discharge Orders    None       Rachel Moulds 08/20/18 1629    Duffy Bruce, MD 08/21/18 1329

## 2018-08-21 ENCOUNTER — Observation Stay (HOSPITAL_COMMUNITY): Payer: Medicare Other | Admitting: Certified Registered"

## 2018-08-21 ENCOUNTER — Encounter (HOSPITAL_COMMUNITY): Payer: Self-pay | Admitting: Gastroenterology

## 2018-08-21 ENCOUNTER — Encounter (HOSPITAL_COMMUNITY)
Admission: EM | Disposition: A | Discharge status: Home / Self Care | Payer: Self-pay | Source: Home / Self Care | Attending: Internal Medicine

## 2018-08-21 DIAGNOSIS — E1122 Type 2 diabetes mellitus with diabetic chronic kidney disease: Secondary | ICD-10-CM | POA: Diagnosis present

## 2018-08-21 DIAGNOSIS — M48061 Spinal stenosis, lumbar region without neurogenic claudication: Secondary | ICD-10-CM | POA: Diagnosis present

## 2018-08-21 DIAGNOSIS — E871 Hypo-osmolality and hyponatremia: Secondary | ICD-10-CM | POA: Diagnosis present

## 2018-08-21 DIAGNOSIS — D62 Acute posthemorrhagic anemia: Secondary | ICD-10-CM | POA: Diagnosis not present

## 2018-08-21 DIAGNOSIS — K219 Gastro-esophageal reflux disease without esophagitis: Secondary | ICD-10-CM | POA: Diagnosis present

## 2018-08-21 DIAGNOSIS — Z23 Encounter for immunization: Secondary | ICD-10-CM | POA: Diagnosis not present

## 2018-08-21 DIAGNOSIS — I129 Hypertensive chronic kidney disease with stage 1 through stage 4 chronic kidney disease, or unspecified chronic kidney disease: Secondary | ICD-10-CM | POA: Diagnosis present

## 2018-08-21 DIAGNOSIS — M5136 Other intervertebral disc degeneration, lumbar region: Secondary | ICD-10-CM | POA: Diagnosis present

## 2018-08-21 DIAGNOSIS — Z79899 Other long term (current) drug therapy: Secondary | ICD-10-CM | POA: Diagnosis not present

## 2018-08-21 DIAGNOSIS — Z7984 Long term (current) use of oral hypoglycemic drugs: Secondary | ICD-10-CM | POA: Diagnosis not present

## 2018-08-21 DIAGNOSIS — K259 Gastric ulcer, unspecified as acute or chronic, without hemorrhage or perforation: Secondary | ICD-10-CM | POA: Diagnosis present

## 2018-08-21 DIAGNOSIS — K2971 Gastritis, unspecified, with bleeding: Secondary | ICD-10-CM | POA: Diagnosis present

## 2018-08-21 DIAGNOSIS — N183 Chronic kidney disease, stage 3 (moderate): Secondary | ICD-10-CM | POA: Diagnosis present

## 2018-08-21 DIAGNOSIS — E861 Hypovolemia: Secondary | ICD-10-CM | POA: Diagnosis present

## 2018-08-21 DIAGNOSIS — R1013 Epigastric pain: Secondary | ICD-10-CM | POA: Diagnosis present

## 2018-08-21 DIAGNOSIS — K254 Chronic or unspecified gastric ulcer with hemorrhage: Secondary | ICD-10-CM | POA: Diagnosis not present

## 2018-08-21 DIAGNOSIS — K5900 Constipation, unspecified: Secondary | ICD-10-CM | POA: Diagnosis present

## 2018-08-21 DIAGNOSIS — F17210 Nicotine dependence, cigarettes, uncomplicated: Secondary | ICD-10-CM | POA: Diagnosis present

## 2018-08-21 DIAGNOSIS — N400 Enlarged prostate without lower urinary tract symptoms: Secondary | ICD-10-CM | POA: Diagnosis present

## 2018-08-21 DIAGNOSIS — Z8249 Family history of ischemic heart disease and other diseases of the circulatory system: Secondary | ICD-10-CM | POA: Diagnosis not present

## 2018-08-21 DIAGNOSIS — I447 Left bundle-branch block, unspecified: Secondary | ICD-10-CM | POA: Diagnosis present

## 2018-08-21 HISTORY — PX: BIOPSY: SHX5522

## 2018-08-21 HISTORY — PX: ESOPHAGOGASTRODUODENOSCOPY (EGD) WITH PROPOFOL: SHX5813

## 2018-08-21 LAB — MRSA PCR SCREENING: MRSA BY PCR: NEGATIVE

## 2018-08-21 LAB — BASIC METABOLIC PANEL
Anion gap: 8 (ref 5–15)
BUN: 27 mg/dL — ABNORMAL HIGH (ref 8–23)
CALCIUM: 8.4 mg/dL — AB (ref 8.9–10.3)
CO2: 23 mmol/L (ref 22–32)
CREATININE: 1.69 mg/dL — AB (ref 0.61–1.24)
Chloride: 103 mmol/L (ref 98–111)
GFR, EST AFRICAN AMERICAN: 46 mL/min — AB (ref >60)
GFR, EST NON AFRICAN AMERICAN: 40 mL/min — AB (ref >60)
Glucose, Bld: 151 mg/dL — ABNORMAL HIGH (ref 70–99)
Potassium: 3.9 mmol/L (ref 3.5–5.1)
SODIUM: 134 mmol/L — AB (ref 135–145)

## 2018-08-21 LAB — CBC
HCT: 31.5 % — ABNORMAL LOW (ref 39.0–52.0)
Hemoglobin: 10 g/dL — ABNORMAL LOW (ref 13.0–17.0)
MCH: 28.1 pg (ref 26.0–34.0)
MCHC: 31.7 g/dL (ref 30.0–36.0)
MCV: 88.5 fL (ref 80.0–100.0)
NRBC: 0 % (ref 0.0–0.2)
PLATELETS: 410 10*3/uL — AB (ref 150–400)
RBC: 3.56 MIL/uL — AB (ref 4.22–5.81)
RDW: 11.7 % (ref 11.5–15.5)
WBC: 7.6 10*3/uL (ref 4.0–10.5)

## 2018-08-21 LAB — HIV ANTIBODY (ROUTINE TESTING W REFLEX): HIV Screen 4th Generation wRfx: NONREACTIVE

## 2018-08-21 LAB — GLUCOSE, CAPILLARY
GLUCOSE-CAPILLARY: 131 mg/dL — AB (ref 70–99)
GLUCOSE-CAPILLARY: 74 mg/dL (ref 70–99)
GLUCOSE-CAPILLARY: 157 mg/dL — AB (ref 70–99)
Glucose-Capillary: 127 mg/dL — ABNORMAL HIGH (ref 70–99)
Glucose-Capillary: 94 mg/dL (ref 70–99)
Glucose-Capillary: 201 mg/dL — ABNORMAL HIGH (ref 70–99)

## 2018-08-21 LAB — TROPONIN I: Troponin I: <0.03 ng/mL (ref <0.03)

## 2018-08-21 SURGERY — ESOPHAGOGASTRODUODENOSCOPY (EGD) WITH PROPOFOL
Anesthesia: Monitor Anesthesia Care

## 2018-08-21 MED ORDER — SODIUM CHLORIDE 0.9 % IV SOLN
INTRAVENOUS | Status: DC
  Administered 2018-08-21: 15:00:00 via INTRAVENOUS

## 2018-08-21 MED ORDER — PANTOPRAZOLE SODIUM 40 MG PO TBEC
40.0000 mg | DELAYED_RELEASE_TABLET | Freq: Two times a day (BID) | ORAL | Status: DC
  Administered 2018-08-21 – 2018-08-22 (×2): 40 mg via ORAL
  Filled 2018-08-21 (×2): qty 1

## 2018-08-21 MED ORDER — PROPOFOL 500 MG/50ML IV EMUL
INTRAVENOUS | Status: DC | PRN
  Administered 2018-08-21: 125 ug/kg/min via INTRAVENOUS

## 2018-08-21 MED ORDER — PROPOFOL 10 MG/ML IV BOLUS
INTRAVENOUS | Status: DC | PRN
  Administered 2018-08-21: 20 mg via INTRAVENOUS
  Administered 2018-08-21: 40 mg via INTRAVENOUS

## 2018-08-21 MED ORDER — SUCRALFATE 1 G PO TABS
1.0000 g | ORAL_TABLET | Freq: Four times a day (QID) | ORAL | Status: DC
  Administered 2018-08-21 – 2018-08-22 (×4): 1 g via ORAL
  Filled 2018-08-21 (×4): qty 1

## 2018-08-21 MED ORDER — PHENYLEPHRINE 40 MCG/ML (10ML) SYRINGE FOR IV PUSH (FOR BLOOD PRESSURE SUPPORT)
PREFILLED_SYRINGE | INTRAVENOUS | Status: DC | PRN
  Administered 2018-08-21: 160 ug via INTRAVENOUS

## 2018-08-21 MED ORDER — SENNOSIDES-DOCUSATE SODIUM 8.6-50 MG PO TABS
2.0000 | ORAL_TABLET | Freq: Two times a day (BID) | ORAL | Status: DC
  Administered 2018-08-21 – 2018-08-22 (×3): 2 via ORAL
  Filled 2018-08-21 (×3): qty 2

## 2018-08-21 MED ORDER — DEXTROSE 50 % IV SOLN
INTRAVENOUS | Status: AC
  Administered 2018-08-21: 50 mL
  Filled 2018-08-21: qty 50

## 2018-08-21 SURGICAL SUPPLY — 15 items

## 2018-08-21 NOTE — Progress Notes (Signed)
   Subjective:  No acute events overnight.  He denies abdominal pain, nausea, and vomiting.  Remains n.p.o. for endoscopy this afternoon.  Objective:  Vital signs in last 24 hours: Vitals:   08/21/18 1458 08/21/18 1629 08/21/18 1630 08/21/18 1640  BP: (!) 143/80 102/63  100/64  Pulse: 75 77 75 70  Resp: 18 (!) 23 (!) 23 18  Temp:  98.1 F (36.7 C)    TempSrc:  Oral    SpO2: 97% 100% 100% 96%  Weight: 90.7 kg     Height: 6\' 2"  (1.88 m)      General: Well-developed elderly male, watching TV in bed in no acute distress CV: Regular rate and rhythm with no murmurs or gallops Abd: Abdomen is soft and nontender, tympanic, normoactive bowel sounds  Assessment/Plan:  Active Problems:   Gastritis   Acute blood loss anemia secondary to massive gastric ulcer   Hyponatremia  # Acute blood loss anemia 2/2 massive gastric ulcer: Patient presented with LUQ and dark stools yesterday and was found to have a 3 g drop in hemoglobin.  He underwent EGD today which showed a massive proximal gastric ulcer as well as a medium size antral ulcer, both no nbleeding.  GI recommended PPI and Carafate. Will follow up biopsy.   # Hyponatremia: Na 130 on admission likely from hypovolemia. Improved today to 134.   # Constipation: Patient denied BM since admission. Reported last BM was 2 weeks ago. Abdomen is tympanic on exam but non-acute. Started bowel regimen.   # New LBBB: No cardiac symptoms. Will refer to cardiology for outpatient stress test.   Dispo: Anticipate discharge tomorrow if hemoglobin remains stable.  Welford Roche, MD 08/21/2018, 5:00 PM Pager: [phone]

## 2018-08-21 NOTE — Op Note (Signed)
[facility] Patient Name: Tim Bradley Procedure Date : 08/21/2018 MRN: [id number] Attending MD: Ronald Lobo , MD Date of Birth: 10-20-1950 CSN: [id number] Age: 67 Admit Type: Inpatient Procedure:                Upper GI endoscopy Indications:              Upper abdominal pain, gastric thickening on CT Providers:                Ronald Lobo, MD, Angus Seller, Laverda Sorenson,                            Technician, Lance Coon, CRNA Referring MD:              Medicines:                Monitored Anesthesia Care Complications:            No immediate complications. Estimated Blood Loss:     Estimated blood loss was minimal. Procedure:                Pre-Anesthesia Assessment:                           - Prior to the procedure, a History and Physical                            was performed, and patient medications and                            allergies were reviewed. The patient's tolerance of                            previous anesthesia was also reviewed. The risks                            and benefits of the procedure and the sedation                            options and risks were discussed with the patient.                            All questions were answered, and informed consent                            was obtained. Prior Anticoagulants: The patient has                            taken no previous anticoagulant or antiplatelet                            agents. ASA Grade Assessment: III - A patient with                            severe systemic disease. After reviewing the risks  and benefits, the patient was deemed in                            satisfactory condition to undergo the procedure.                           After obtaining informed consent, the endoscope was                            passed under direct vision. Throughout the                            procedure, the patient's blood pressure, pulse, and                          oxygen saturations were monitored continuously. The                            GIF-H190 ([id number] Olympus Adult EGD was introduced                            through the mouth, and advanced to the second part                            of duodenum. The upper GI endoscopy was                            accomplished without difficulty. The patient                            tolerated the procedure well. Scope In: Scope Out: Findings:      The larynx was normal.      The examined esophagus was normal.      A large amount of food (residue) was found in the gastric fundus.      One non-obstructing non-bleeding cratered gastric ulcer of significant       severity with pigmented material was found on the lesser curvature of       the gastric body. The lesion was 60 mm in largest dimension. Biopsies       were taken with a cold forceps for histology. Estimated blood loss was       minimal.      One non-bleeding cratered gastric ulcer with a clean ulcer base (Forrest       Class III) was found on the anterior wall of the gastric antrum. The       lesion was 25 mm in largest dimension. Biopsies were taken with a cold       forceps for histology. Estimated blood loss was minimal.      The exam of the stomach was otherwise normal.      The cardia and gastric fundus were normal on retroflexion.      The examined duodenum was normal. Impression:               - MASSIVE PROXIMAL GASTRIC ULCER and medium-sized  antral ulcer                           - Normal larynx.                           - Normal esophagus.                           - A large amount of food (residue) in the stomach,                            possibly related to patient's diabetes.                           - Non-obstructing non-bleeding gastric ulcer with                            pigmented material, roughly 5-6 cm in diameter.                            Biopsied.                            - Non-bleeding gastric ulcer with a clean ulcer                            base (Forrest Class III) roughly 2 cm diameter.                            Biopsied.                           - Normal examined duodenum. Recommendation:           - Await pathology results.                           - Use Protonix (pantoprazole) 40 mg PO BID for 2                            months.                           - Use sucralfate tablets 1 gram PO QID for 2 weeks. Procedure Code(s):        --- Professional ---                           [phone], Esophagogastroduodenoscopy, flexible,                            transoral; with biopsy, single or multiple Diagnosis Code(s):        --- Professional ---                           K25.9, Gastric ulcer, unspecified as acute or  chronic, without hemorrhage or perforation                           R10.10, Upper abdominal pain, unspecified CPT copyright 2018 American Medical Association. All rights reserved. The codes documented in this report are preliminary and upon coder review may  be revised to meet current compliance requirements. Ronald Lobo, MD 08/21/2018 4:35:03 PM This report has been signed electronically. Number of Addenda: 0

## 2018-08-21 NOTE — Interval H&P Note (Signed)
History and Physical Interval Note:  08/21/2018 4:09 PM  Tim Bradley  has presented today for surgery, with the diagnosis of Abnormal CT, Gastritis  The various methods of treatment have been discussed with the patient. After consideration of risks, benefits and other options for treatment, the patient has consented to  Procedure(s): ESOPHAGOGASTRODUODENOSCOPY (EGD) WITH PROPOFOL (N/A) as a surgical intervention .  The patient's history has been reviewed, patient examined, no change in status, stable for surgery.  I have reviewed the patient's chart and labs.  Questions were answered to the patient's satisfaction.     [name]

## 2018-08-21 NOTE — Progress Notes (Addendum)
Patient's endoscopy, somewhat surprisingly, shows a massive gastric ulcer along the lesser curve in the distal body of the stomach near the incisura, as well as smaller ulcer in the antrum.  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  These ulcers look benign, but the patient only admits to very limited exposure to nonsteroidal anti-inflammatory drugs, and no aspirin use.  Please see dictated procedure report for more details.  I have intensified his antipeptic therapy and have advanced him to a low fiber diet, since he is hungry.  Please call me if you would like to discuss his case in more detail.  Cleotis Nipper, M.D. Pager [phone] If no answer or after 5 PM call [phone]

## 2018-08-21 NOTE — Transfer of Care (Signed)
Immediate Anesthesia Transfer of Care Note  Patient: Tim Bradley  Procedure(s) Performed: ESOPHAGOGASTRODUODENOSCOPY (EGD) WITH PROPOFOL (N/A ) BIOPSY  Patient Location: Endoscopy Unit  Anesthesia Type:MAC  Level of Consciousness: drowsy and patient cooperative  Airway & Oxygen Therapy: Patient Spontanous Breathing  Post-op Assessment: Report given to RN and Post -op Vital signs reviewed and stable  Post vital signs: Reviewed and stable  Last Vitals:  Vitals Value Taken Time  BP 102/63 08/21/2018  4:29 PM  Temp    Pulse 75 08/21/2018  4:30 PM  Resp 23 08/21/2018  4:30 PM  SpO2 100 % 08/21/2018  4:30 PM  Vitals shown include unvalidated device data.  Last Pain:  Vitals:   08/21/18 1458  TempSrc:   PainSc: 0-No pain         Complications: No apparent anesthesia complications

## 2018-08-21 NOTE — Anesthesia Preprocedure Evaluation (Signed)
Anesthesia Evaluation  Patient identified by MRN, date of birth, ID band Patient awake    Reviewed: Allergy & Precautions, NPO status , Patient's Chart, lab work & pertinent test results  Airway Mallampati: II  TM Distance: >3 FB Neck ROM: Full    Dental  (+) Edentulous Upper, Edentulous Lower   Pulmonary Current Smoker,    Pulmonary exam normal breath sounds clear to auscultation       Cardiovascular hypertension, Pt. on medications Normal cardiovascular exam Rhythm:Regular Rate:Normal     Neuro/Psych negative neurological ROS  negative psych ROS   GI/Hepatic Neg liver ROS, GERD  ,Gastritis    Endo/Other  diabetes, Type 2, Oral Hypoglycemic Agents  Renal/GU negative Renal ROS     Musculoskeletal  (+) Arthritis ,   Abdominal   Peds  Hematology  (+) Blood dyscrasia, anemia ,   Anesthesia Other Findings Day of surgery medications reviewed with the patient.  Reproductive/Obstetrics                             Anesthesia Physical Anesthesia Plan  ASA: III  Anesthesia Plan: MAC   Post-op Pain Management:    Induction: Intravenous  PONV Risk Score and Plan: 0 and Propofol infusion and Treatment may vary due to age or medical condition  Airway Management Planned: Nasal Cannula and Natural Airway  Additional Equipment:   Intra-op Plan:   Post-operative Plan:   Informed Consent: I have reviewed the patients History and Physical, chart, labs and discussed the procedure including the risks, benefits and alternatives for the proposed anesthesia with the patient or authorized representative who has indicated his/her understanding and acceptance.   Dental advisory given  Plan Discussed with: CRNA and Anesthesiologist  Anesthesia Plan Comments:         Anesthesia Quick Evaluation

## 2018-08-21 NOTE — Anesthesia Postprocedure Evaluation (Signed)
Anesthesia Post Note  Patient: CHANOCH MCCLEERY  Procedure(s) Performed: ESOPHAGOGASTRODUODENOSCOPY (EGD) WITH PROPOFOL (N/A ) BIOPSY     Patient location during evaluation: Endoscopy Anesthesia Type: MAC Level of consciousness: awake and alert, oriented and awake Pain management: pain level controlled Vital Signs Assessment: post-procedure vital signs reviewed and stable Respiratory status: spontaneous breathing, nonlabored ventilation and respiratory function stable Cardiovascular status: stable and blood pressure returned to baseline Postop Assessment: no apparent nausea or vomiting Anesthetic complications: no    Last Vitals:  Vitals:   08/21/18 1640 08/21/18 1705  BP: 100/64 125/72  Pulse: 70 70  Resp: 18 18  Temp:  37.1 C  SpO2: 96% 97%    Last Pain:  Vitals:   08/21/18 1707  TempSrc:   PainSc: 0-No pain                 Catalina Gravel

## 2018-08-21 NOTE — Anesthesia Procedure Notes (Signed)
Procedure Name: MAC Date/Time: 08/21/2018 4:05 PM Performed by: Lance Coon, CRNA Pre-anesthesia Checklist: Patient identified, Emergency Drugs available, Suction available, Patient being monitored and Timeout performed Patient Re-evaluated:Patient Re-evaluated prior to induction Oxygen Delivery Method: Nasal cannula

## 2018-08-22 DIAGNOSIS — K5900 Constipation, unspecified: Secondary | ICD-10-CM

## 2018-08-22 DIAGNOSIS — D62 Acute posthemorrhagic anemia: Secondary | ICD-10-CM

## 2018-08-22 DIAGNOSIS — K254 Chronic or unspecified gastric ulcer with hemorrhage: Secondary | ICD-10-CM

## 2018-08-22 LAB — CBC
HCT: 31.2 % — ABNORMAL LOW (ref 39.0–52.0)
Hemoglobin: 10 g/dL — ABNORMAL LOW (ref 13.0–17.0)
MCH: 28.8 pg (ref 26.0–34.0)
MCHC: 32.1 g/dL (ref 30.0–36.0)
MCV: 89.9 fL (ref 80.0–100.0)
PLATELETS: 426 10*3/uL — AB (ref 150–400)
RBC: 3.47 MIL/uL — ABNORMAL LOW (ref 4.22–5.81)
RDW: 11.9 % (ref 11.5–15.5)
WBC: 6.4 10*3/uL (ref 4.0–10.5)
nRBC: 0 % (ref 0.0–0.2)

## 2018-08-22 LAB — BASIC METABOLIC PANEL
Anion gap: 8 (ref 5–15)
BUN: 26 mg/dL — ABNORMAL HIGH (ref 8–23)
CO2: 22 mmol/L (ref 22–32)
CREATININE: 1.42 mg/dL — AB (ref 0.61–1.24)
Calcium: 8.2 mg/dL — ABNORMAL LOW (ref 8.9–10.3)
Chloride: 106 mmol/L (ref 98–111)
GFR calc Af Amer: 57 mL/min — ABNORMAL LOW (ref >60)
GFR calc non Af Amer: 49 mL/min — ABNORMAL LOW (ref >60)
Glucose, Bld: 185 mg/dL — ABNORMAL HIGH (ref 70–99)
Potassium: 4.1 mmol/L (ref 3.5–5.1)
Sodium: 136 mmol/L (ref 135–145)

## 2018-08-22 LAB — GLUCOSE, CAPILLARY
Glucose-Capillary: 122 mg/dL — ABNORMAL HIGH (ref 70–99)
Glucose-Capillary: 173 mg/dL — ABNORMAL HIGH (ref 70–99)

## 2018-08-22 MED ORDER — RAMELTEON 8 MG PO TABS
8.0000 mg | ORAL_TABLET | Freq: Once | ORAL | Status: AC
  Administered 2018-08-22: 8 mg via ORAL
  Filled 2018-08-22: qty 1

## 2018-08-22 MED ORDER — PANTOPRAZOLE SODIUM 40 MG PO TBEC: 40.0000 mg | DELAYED_RELEASE_TABLET | Freq: Two times a day (BID) | ORAL | 1 refills | Status: DC

## 2018-08-22 MED ORDER — INFLUENZA VAC SPLIT HIGH-DOSE 0.5 ML IM SUSY: 0.5000 mL | PREFILLED_SYRINGE | INTRAMUSCULAR | Status: DC

## 2018-08-22 MED ORDER — SUCRALFATE 1 G PO TABS: 1.0000 g | ORAL_TABLET | Freq: Four times a day (QID) | ORAL | 0 refills | Status: DC

## 2018-08-22 MED ORDER — MAGNESIUM CITRATE PO SOLN
1.0000 | Freq: Once | ORAL | Status: AC
  Administered 2018-08-22: 1 via ORAL
  Filled 2018-08-22: qty 296

## 2018-08-22 MED ORDER — PNEUMOCOCCAL VAC POLYVALENT 25 MCG/0.5ML IJ INJ: 0.5000 mL | INJECTION | INTRAMUSCULAR | Status: DC

## 2018-08-22 NOTE — Plan of Care (Signed)
  Problem: Education: Goal: Knowledge of General Education information will improve Description Including pain rating scale, medication(s)/side effects and non-pharmacologic comfort measures Outcome: Progressing   

## 2018-08-22 NOTE — Progress Notes (Signed)
  Date: 08/22/2018  Patient name: Tim Bradley  Medical record number: [id number]  Date of birth: 1951-04-05   I have seen and evaluated this patient and I have discussed the plan of care with the house staff. Please see their note for complete details. I concur with their findings with the following additions/corrections: [name] was seen on morning rounds with the team.  He ate all of his breakfast and is hungry for more food.  He went for a walk in the hallways after he left his room.  Pathology reports from his ulcer biopsy are pending.  He remains on Carafate and a PPI.  He is stable to be discharged home if arrangements can be made.  He will need outpatient follow-up  Bartholomew Crews, MD 08/22/2018, 2:12 PM

## 2018-08-22 NOTE — Discharge Summary (Signed)
Name: Tim Bradley MRN: [id number] DOB: 1951-04-28 67 y.o. PCP: System, Pcp Not In  Date of Admission: 08/20/2018  6:12 AM Date of Discharge: 08/22/2018 Attending Physician: Dr. Eppie Gibson   Discharge Diagnosis: 1. Upper GI bleed secondary to gastric ulcer  Discharge Medications: Allergies as of 08/22/2018   No Known Allergies     Medication List    STOP taking these medications   lactulose 10 GM/15ML solution Commonly known as:  CHRONULAC   metoCLOPramide 10 MG tablet Commonly known as:  REGLAN     TAKE these medications   dicyclomine 20 MG tablet Commonly known as:  BENTYL Take 1 tablet (20 mg total) by mouth 3 (three) times daily before meals.   docusate sodium 250 MG capsule Commonly known as:  COLACE Take 1 capsule (250 mg total) by mouth daily.   metFORMIN 500 MG tablet Commonly known as:  GLUCOPHAGE Take 500 mg by mouth 2 (two) times daily with a meal.   pantoprazole 40 MG tablet Commonly known as:  PROTONIX Take 1 tablet (40 mg total) by mouth 2 (two) times daily before a meal. What changed:  when to take this   polyethylene glycol packet Commonly known as:  MIRALAX / GLYCOLAX Use one dose up to three times daily, maximum 3 consecutive days What changed:    how much to take  how to take this  when to take this  reasons to take this   senna-docusate 8.6-50 MG tablet Commonly known as:  Senokot-S Take 1 tablet by mouth 2 (two) times daily for 10 days.   sucralfate 1 g tablet Commonly known as:  CARAFATE Take 1 tablet (1 g total) by mouth 4 (four) times daily.   tamsulosin 0.4 MG Caps capsule Commonly known as:  FLOMAX Take 0.4 mg by mouth daily.   traMADol 50 MG tablet Commonly known as:  ULTRAM Take 50 mg by mouth 3 (three) times daily. for pain   triamterene-hydrochlorothiazide 75-50 MG tablet Commonly known as:  MAXZIDE Take 1 tablet by mouth daily.       Disposition and follow-up:   Tim Bradley was discharged from [facility] in Stable condition.  At the hospital follow up visit please address:  1.  Please ensure patient has been compliant with PPI and carafate, and H. pylori treatment. Please ensure patient has follow up with GI.   2.  Labs / imaging needed at time of follow-up: CBC to check Hgb, will need confirmation of eradication testing in 4 weeks   3.  Pending labs/ test needing follow-up: None   Follow-up Appointments: [facility] Follow up.   Specialty:  General Practice Why:  Please schedule a hospital follow-up appointment with your primary care doctor within the next 1 or 2 weeks to ensure you are continuing to do well. Contact information: [place] [address] [phone]           Hospital Course by problem list:  1. Upper GI bleed secondary to large gastric ulcer: Tim Bradley presented with a 2-week history of LUQ pain, nausea, vomiting, dark stools and was found to have a 3g decrease in hemoglobin. GI was consulted for EGD given concern for upper GI bleed, which showed a massive gastric ulcer (see report below). He was started on PPI and Carafate and discharged home in stable condition. EGD biopsy results, obtained after patient was discharged, showed presence of H. pylori. Patient was contacted  on 11/27 with biopsy results. He was started on clarithromycin 500 mg BID and amoxicillin 1g BID in addition to the PPI. He was advised to follow up with his PCP at the [facility] as he will need testing for confirmation of eradication in 4 weeks.   2. New LBBB: This was noted in patient's admission EKG without . We recommended outpatient evaluation given lack of cardiac symptoms on presentation.   Discharge Vitals:   BP 117/71 (BP Location: Left Arm)   Pulse 81   Temp 98.3 F (36.8 C) (Oral)   Resp 18   Ht 6\' 2"  (1.88 m)   Wt 199 lb 15.3 oz (90.7 kg)   SpO2 98%   BMI 25.67 kg/m   Pertinent Labs, Studies, and Procedures:    CBC Latest Ref Rng & Units 08/22/2018 08/21/2018 08/20/2018  WBC 4.0 - 10.5 K/uL 6.4 7.6 10.5  Hemoglobin 13.0 - 17.0 g/dL 10.0(L) 10.0(L) 11.9(L)  Hematocrit 39.0 - 52.0 % 31.2(L) 31.5(L) 38.1(L)  Platelets 150 - 400 K/uL 426(H) 410(H) 484(H)   BMP Latest Ref Rng & Units 08/22/2018 08/21/2018 08/20/2018  Glucose 70 - 99 mg/dL 185(H) 151(H) 171(H)  BUN 8 - 23 mg/dL 26(H) 27(H) 31(H)  Creatinine 0.61 - 1.24 mg/dL 1.42(H) 1.69(H) 1.66(H)  Sodium 135 - 145 mmol/L 136 134(L) 130(L)  Potassium 3.5 - 5.1 mmol/L 4.1 3.9 3.9  Chloride 98 - 111 mmol/L 106 103 97(L)  CO2 22 - 32 mmol/L 22 23 23   Calcium 8.9 - 10.3 mg/dL 8.2(L) 8.4(L) 9.2   EGD 11/23:  - MASSIVE PROXIMAL GASTRIC ULCER and medium-sized antral ulcer - Normal larynx. - Normal esophagus. - A large amount of food (residue) in the stomach, possibly related to patient's diabetes. - Non-obstructing non-bleeding gastric ulcer with pigmented material, roughly 5-6 cm in diameter. Biopsied. - Non-bleeding gastric ulcer with a clean ulcer base (Forrest Class III) roughly 2 cm diameter. Biopsied. - Normal examined duodenum.  Discharge Instructions: Discharge Instructions    Call MD for:  persistant nausea and vomiting   Complete by:  As directed    Call MD for:  severe uncontrolled pain   Complete by:  As directed    Diet - low sodium heart healthy   Complete by:  As directed    Discharge instructions   Complete by:  As directed    Tim Bradley, Tim Bradley were admitted to the hospital for abdominal pain and were found to have a very big ulcer in your stomach.  He will need start taking Protonix 40 mg 2 times a day.  Please take this medicine 30 minutes before eating breakfast and dinner.  You will also start taking Carafate 1 mg tabs 4 times a day this will help with the pain.  To prevent ulcers in your stomach from recurrening please avoid the use of alcohol in the future as well as NSAIDs (ibuprofen, Advil, Aleve, Motrin, naproxen,  Goody/BC powder). The stomach doctors will contact you   Please make sure to make a hospital follow-up appointment with your primary care provider within the next 1 to 2 weeks to make sure you are continued to do well.  Please call if you have any questions.   Increase activity slowly   Complete by:  As directed       Signed: Welford Roche, MD 08/23/2018, 1:11 PM   Pager: [phone]

## 2018-08-22 NOTE — Progress Notes (Signed)
   Subjective:  No acute events overnight yesterday.  Patient tolerated endoscopy well.  Denies abdominal pain and states he feels very hungry after eating breakfast.  No bowel movement since his admission reports last bowel movement was 2 weeks ago. Denies N/V.   Objective:  Vital signs in last 24 hours: Vitals:   08/21/18 1705 08/21/18 2100 08/22/18 0517 08/22/18 1427  BP: 125/72 108/60 (!) 142/82 117/71  Pulse: 70 77 (!) 114 81  Resp: 18 20 20 18   Temp: 98.7 F (37.1 C) 98.7 F (37.1 C) 98.3 F (36.8 C) 98.3 F (36.8 C)  TempSrc: Oral Oral Oral Oral  SpO2: 97% 98% 98% 98%  Weight:      Height:       General: Elderly male in bed, no acute distress Abd: Abdomen remains soft but tympanic to percussion, hyperactive bowel sounds, nontender  Assessment/Plan:  Principal Problem:   Acute blood loss anemia secondary to massive gastric ulcer Active Problems:   Gastritis   Hyponatremia   Constipation  # Acute blood loss anemia 2/2 massive gastric ulcer: Hgb remains stable in the 10s after endoscopic procedure. He has tolerated PO intake very well as well. GI recommended PPI x 8 weeks and Carafate x 2 weeks. Will follow up biopsy.   # Hyponatremia: Resolved.   # Constipation: Patient reports last bowel movement was 2 weeks ago.  His abdominal exam is benign and CTA of abdomen and pelvis on 11/22 was negative for obstruction, significant dilatation, ileus, and free air.  We will adjust bowel regimen.   # New LBBB: No cardiac symptoms. Will refer to cardiology for outpatient stress test.   Dispo: Home today.    Welford Roche, MD 08/22/2018, 2:44 PM Pager: [phone]

## 2018-08-22 NOTE — Progress Notes (Signed)
Hemoglobin completely stable.  Endoscopic findings reviewed with patient and sister at bedside.  No abdominal pain--this severe pain he was previously having has resolved.  Tolerating soft diet without difficulty.  Impression: Stable massive gastric ulceration without evidence of bleeding, obstruction, or ongoing pain.  Recommendations:  1.  Okay for discharge at any time.  No dietary restrictions.  Would recommend twice daily PPI for 1 month, then once daily PPI thereafter.  No need for sucralfate following discharge (ideally, he might take it for a week or so, but rather than complicate his medical regimen, it probably makes the most sense to single-agent therapy).  No dietary restrictions.  General principles of gastroparesis diet discussed with patient and sister, in view of the retained food noted on his endoscopy.  This might be a consequence of his ulcer disease, or due to his diabetes. 2.  We will contact the patient with his biopsy results in the near future, and arrange endoscopic follow-up.  In the past, he has had his care through the [facility] in [place] but he thinks he would like to have his follow-up endoscopy here, since he lives in [place]. 3.  I will sign off.  Please call if you have any questions or if we can be of further assistance in this patient's care.  Cleotis Nipper, M.D. Pager [phone] If no answer or after 5 PM call [phone]

## 2018-08-24 ENCOUNTER — Encounter (HOSPITAL_COMMUNITY): Payer: Self-pay | Admitting: Gastroenterology

## 2018-08-25 ENCOUNTER — Other Ambulatory Visit: Payer: Self-pay | Admitting: Internal Medicine

## 2018-08-25 MED ORDER — CLARITHROMYCIN 500 MG PO TABS: 500.0000 mg | ORAL_TABLET | Freq: Two times a day (BID) | ORAL | 0 refills | Status: AC

## 2018-08-25 MED ORDER — AMOXICILLIN 500 MG PO CAPS: 1000.0000 mg | ORAL_CAPSULE | Freq: Two times a day (BID) | ORAL | 0 refills | Status: AC

## 2018-08-31 ENCOUNTER — Emergency Department (HOSPITAL_COMMUNITY): Payer: Medicare Other

## 2018-08-31 ENCOUNTER — Emergency Department (HOSPITAL_COMMUNITY)
Admission: EM | Admit: 2018-08-31 | Discharge: 2018-08-31 | Disposition: A | Discharge status: Home / Self Care | Payer: Medicare Other | Attending: Emergency Medicine | Admitting: Emergency Medicine

## 2018-08-31 ENCOUNTER — Other Ambulatory Visit: Payer: Self-pay

## 2018-08-31 ENCOUNTER — Encounter (HOSPITAL_COMMUNITY): Payer: Self-pay | Admitting: Emergency Medicine

## 2018-08-31 DIAGNOSIS — Y998 Other external cause status: Secondary | ICD-10-CM | POA: Diagnosis not present

## 2018-08-31 DIAGNOSIS — I10 Essential (primary) hypertension: Secondary | ICD-10-CM | POA: Insufficient documentation

## 2018-08-31 DIAGNOSIS — W19XXXA Unspecified fall, initial encounter: Secondary | ICD-10-CM | POA: Diagnosis not present

## 2018-08-31 DIAGNOSIS — Z7984 Long term (current) use of oral hypoglycemic drugs: Secondary | ICD-10-CM | POA: Diagnosis not present

## 2018-08-31 DIAGNOSIS — M5412 Radiculopathy, cervical region: Secondary | ICD-10-CM | POA: Insufficient documentation

## 2018-08-31 DIAGNOSIS — Z79899 Other long term (current) drug therapy: Secondary | ICD-10-CM | POA: Diagnosis not present

## 2018-08-31 DIAGNOSIS — F17210 Nicotine dependence, cigarettes, uncomplicated: Secondary | ICD-10-CM | POA: Insufficient documentation

## 2018-08-31 DIAGNOSIS — E119 Type 2 diabetes mellitus without complications: Secondary | ICD-10-CM | POA: Diagnosis not present

## 2018-08-31 DIAGNOSIS — Y939 Activity, unspecified: Secondary | ICD-10-CM | POA: Diagnosis not present

## 2018-08-31 DIAGNOSIS — Y929 Unspecified place or not applicable: Secondary | ICD-10-CM | POA: Insufficient documentation

## 2018-08-31 DIAGNOSIS — S0990XA Unspecified injury of head, initial encounter: Secondary | ICD-10-CM | POA: Diagnosis present

## 2018-08-31 MED ORDER — LORAZEPAM 2 MG/ML IJ SOLN
2.0000 mg | Freq: Once | INTRAMUSCULAR | Status: AC
  Administered 2018-08-31: 2 mg via INTRAVENOUS
  Filled 2018-08-31: qty 1

## 2018-08-31 MED ORDER — ACETAMINOPHEN 325 MG PO TABS
650.0000 mg | ORAL_TABLET | Freq: Once | ORAL | Status: AC
  Administered 2018-08-31: 650 mg via ORAL
  Filled 2018-08-31: qty 2

## 2018-08-31 MED ORDER — METHOCARBAMOL 500 MG PO TABS
500.0000 mg | ORAL_TABLET | Freq: Once | ORAL | Status: AC
  Administered 2018-08-31: 500 mg via ORAL
  Filled 2018-08-31: qty 1

## 2018-08-31 MED ORDER — LORAZEPAM 1 MG PO TABS
1.0000 mg | ORAL_TABLET | Freq: Once | ORAL | Status: AC
  Administered 2018-08-31: 1 mg via ORAL
  Filled 2018-08-31: qty 1

## 2018-08-31 NOTE — ED Notes (Signed)
Got patient on the monitor patient is resting with call bell in reach  ?

## 2018-08-31 NOTE — ED Provider Notes (Signed)
[facility] EMERGENCY DEPARTMENT Provider Note   CSN: [id number] Arrival date & time: 08/31/18  1049     History   Chief Complaint Chief Complaint  Patient presents with  . Fall    HPI Tim Bradley is a 67 y.o. male.  The history is provided by the patient.  Fall  This is a new problem. The current episode started more than 2 days ago. The problem has been resolved. Pertinent negatives include no chest pain, no abdominal pain, no headaches and no shortness of breath. Associated symptoms comments: Tingling to bilaterally upper extremities, hx of multiple neck surgeries, chronic hand weakness but states tingling is new since the fall three days ago.. Nothing aggravates the symptoms. Nothing relieves the symptoms. He has tried nothing for the symptoms. The treatment provided no relief.    Past Medical History:  Diagnosis Date  . Abscess 02/2017   LEFT GLUTEAL   . Acid reflux   . DDD (degenerative disc disease), lumbar   . Diabetes mellitus without complication ([place])   . Enlarged prostate   . Hypertension   . Spinal stenosis, lumbar     Patient Active Problem List   Diagnosis Date Noted  . Constipation 08/22/2018  . Gastritis 08/20/2018  . Heme positive stool   . Acute blood loss anemia secondary to massive gastric ulcer   . Hyponatremia   . Abscess, gluteal, left 03/26/2017  . Type 2 diabetes mellitus ([place]) 03/26/2017  . Adrenal nodule ([place]) 03/26/2017  . Closed fracture of body of thoracic vertebra ([place]) 03/26/2017  . Spinal stenosis of lumbar region 03/26/2017    Past Surgical History:  Procedure Laterality Date  . BACK SURGERY     cervical x2  . BIOPSY  08/21/2018   Procedure: BIOPSY;  Surgeon: Ronald Lobo, MD;  Location: [facility];  Service: Endoscopy;;  . ESOPHAGOGASTRODUODENOSCOPY (EGD) WITH PROPOFOL N/A 08/21/2018   Procedure: ESOPHAGOGASTRODUODENOSCOPY (EGD) WITH PROPOFOL;  Surgeon: Ronald Lobo, MD;  Location: [facility];   Service: Endoscopy;  Laterality: N/A;  . INCISION AND DRAINAGE PERIRECTAL ABSCESS N/A 03/26/2017   Procedure: IRRIGATION AND DEBRIDEMENT PERIRECTAL ABSCESS;  Surgeon: Rolm Bookbinder, MD;  Location: [facility];  Service: General;  Laterality: N/A;        Home Medications    Prior to Admission medications   Medication Sig Start Date End Date Taking? Authorizing Provider  amoxicillin (AMOXIL) 500 MG capsule Take 2 capsules (1,000 mg total) by mouth 2 (two) times daily for 14 days. 08/25/18 09/08/18  Welford Roche, MD  clarithromycin (BIAXIN) 500 MG tablet Take 1 tablet (500 mg total) by mouth 2 (two) times daily for 14 days. 08/25/18 09/08/18  Welford Roche, MD  dicyclomine (BENTYL) 20 MG tablet Take 1 tablet (20 mg total) by mouth 3 (three) times daily before meals. 08/17/18   Orpah Greek, MD  docusate sodium (COLACE) 250 MG capsule Take 1 capsule (250 mg total) by mouth daily. 08/13/18   Charlann Lange, PA-C  metFORMIN (GLUCOPHAGE) 500 MG tablet Take 500 mg by mouth 2 (two) times daily with a meal.    [provider]  pantoprazole (PROTONIX) 40 MG tablet Take 1 tablet (40 mg total) by mouth 2 (two) times daily before a meal. 08/22/18   Santos-Sanchez, Merlene Morse, MD  polyethylene glycol (MIRALAX) packet Use one dose up to three times daily, maximum 3 consecutive days Patient taking differently: Take 17 g by mouth 3 (three) times daily as needed for mild constipation. Use one dose up  to three times daily, maximum 3 consecutive days 08/13/18   Charlann Lange, PA-C  sucralfate (CARAFATE) 1 g tablet Take 1 tablet (1 g total) by mouth 4 (four) times daily. 08/22/18   Santos-Sanchez, Merlene Morse, MD  tamsulosin (FLOMAX) 0.4 MG CAPS capsule Take 0.4 mg by mouth daily.    [provider]  traMADol (ULTRAM) 50 MG tablet Take 50 mg by mouth 3 (three) times daily. for pain 04/20/18   [provider]  triamterene-hydrochlorothiazide (MAXZIDE) 75-50 MG per tablet  Take 1 tablet by mouth daily.    [provider]    Family History Family History  Problem Relation Age of Onset  . Hypertension Mother   . Cancer - Lung Father     Social History Social History   Tobacco Use  . Smoking status: Current Every Day Smoker    Packs/day: 0.25    Types: Cigarettes  . Smokeless tobacco: Never Used  Substance Use Topics  . Alcohol use: Yes    Alcohol/week: 12.0 standard drinks    Types: 12 Cans of beer per week    Comment: i can of beer daily  . Drug use: Yes    Types: Cocaine, Marijuana    Comment: cocaine last use Sunday 05/02/18     Allergies   Patient has no known allergies.   Review of Systems Review of Systems  Constitutional: Negative for chills and fever.  HENT: Negative for ear pain and sore throat.   Eyes: Negative for pain and visual disturbance.  Respiratory: Negative for cough and shortness of breath.   Cardiovascular: Negative for chest pain and palpitations.  Gastrointestinal: Negative for abdominal pain and vomiting.  Genitourinary: Negative for dysuria and hematuria.  Musculoskeletal: Negative for arthralgias and back pain.  Skin: Negative for color change and rash.  Neurological: Positive for weakness (chronic) and numbness. Negative for dizziness, tremors, seizures, syncope, facial asymmetry, speech difficulty, light-headedness and headaches.  All other systems reviewed and are negative.    Physical Exam Updated Vital Signs  ED Triage Vitals [08/31/18 1047]  Enc Vitals Group     BP 140/60     Pulse Rate 60     Resp 16     Temp 97.8 F (36.6 C)     Temp Source Oral     SpO2 99 %     Weight      Height      Head Circumference      Peak Flow      Pain Score 3     Pain Loc      Pain Edu?      Excl. in [place]?     Physical Exam  Constitutional: He is oriented to person, place, and time. He appears well-developed and well-nourished.  HENT:  Head: Normocephalic and atraumatic.  Eyes: Pupils are equal,  round, and reactive to light. Conjunctivae and EOM are normal.  Neck: Normal range of motion. Neck supple.  Cardiovascular: Normal rate, regular rhythm, normal heart sounds and intact distal pulses.  No murmur heard. Pulmonary/Chest: Effort normal and breath sounds normal. No respiratory distress.  Abdominal: Soft. There is no tenderness.  Musculoskeletal: Normal range of motion. He exhibits no edema or tenderness.  No midline spinal tenderness, decreased range of motion of the neck at baseline about 30 degrees in each direction  Neurological: He is alert and oriented to person, place, and time. No cranial nerve deficit. Coordination normal.  Patient with 4+ out of 5 strength in bilateral intrinsic hand  muscles, 2+ out of 5 strength in left fourth and fifth fingers which patient states is chronic, weakness bilaterally and wrist extensors 4+ out of 5, decreased sensation along ulnar side of bilateral upper extremities, otherwise strength and sensation intact throughout  Skin: Skin is warm and dry.  Psychiatric: He has a normal mood and affect.  Nursing note and vitals reviewed.    ED Treatments / Results  Labs (all labs ordered are listed, but only abnormal results are displayed) Labs Reviewed - No data to display  EKG None  Radiology No results found.  Procedures Procedures (including critical care time)  Medications Ordered in ED Medications  acetaminophen (TYLENOL) tablet 650 mg (650 mg Oral Given 08/31/18 1120)  methocarbamol (ROBAXIN) tablet 500 mg (500 mg Oral Given 08/31/18 1120)  LORazepam (ATIVAN) tablet 1 mg (1 mg Oral Given 08/31/18 1348)  LORazepam (ATIVAN) injection 2 mg (2 mg Intravenous Given 08/31/18 1507)     Initial Impression / Assessment and Plan / ED Course  I have reviewed the triage vital signs and the nursing notes.  Pertinent labs & imaging results that were available during my care of the patient were reviewed by me and considered in my medical decision  making (see chart for details).     Tim Bradley is a 67 year old male with history of chronic neck pain with multiple cervical surgeries in the past who presents to the ED following a fall 2 days ago with numbness to bilateral upper extremities.  Patient denies any neck pain, midline spinal pain.  Patient with normal vitals.  No fever.  He states that he has baseline weakness in both hands but typically does not have numbness in his arms.  Patient appears to have numbness in the medial sides of each arm bilaterally for mid upper arm down to 4th-5th fingers.  Patient has 4+ out of 5 strength in bilateral intrinsic hand muscles, wrist extensors but otherwise sensation and strength appears normal throughout.  Has no midline spinal tenderness.  Range of motion of the neck appears to be baseline as about 30 degrees to each side which is the same from prior notes.  Patient has the most severe weakness in his left fourth and fifth digits which she states is also baseline.  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    Patient given Tylenol and Robaxin for pain.  Patient given Ativan for MRI which has been delayed as patient was unable to tolerate MRI initially.  Patient waiting for repeat MRI with IV Ativan at this time.  Hemodynamically stable.  Patient handed off to oncoming ED staff with patient pending MRI results.  Will need to recontact neurosurgery to evaluate imaging.  Patient does not appear to have any new muscle weakness but does appear to have some new sensation changes. Pt refuses to wear cervical collar.  This chart was dictated using voice recognition  software.  Despite best efforts to proofread,  errors can occur which can change the documentation meaning.   Final Clinical Impressions(s) / ED Diagnoses   Final diagnoses:  Fall, initial encounter  Cervical radiculopathy    ED Discharge Orders  None       Lennice Sites, DO 08/31/18 8412

## 2018-08-31 NOTE — ED Triage Notes (Signed)
Pt had a fall on 11/29 where he had a LOC and hit the top and has a healing lac on top of head. Pt states ever since the fall he has had bilateral tingling in both arms with swelling in both hands worse on right. Pt is alert and ox4.

## 2018-08-31 NOTE — Progress Notes (Signed)
Pt re-attempted MRI. Pt was given IV medications and pt was laying on the stretcher asleep after 15-20 minutes.  Moved pt over to the MRI table and pt said he knows he won't be able to complete exam.  When asked if he didn't want to do it, he said I can't do this, I feel sick.  Pt was moved back to his stretcher to be transported back to the ER.

## 2018-08-31 NOTE — ED Notes (Signed)
PT returned from MRI at this time. Resting comfortably in progressive care area

## 2018-08-31 NOTE — ED Notes (Signed)
Patient transported to MRI 

## 2018-08-31 NOTE — ED Notes (Signed)
Pt refusing to wear C-collar despite recommendations.

## 2018-08-31 NOTE — ED Notes (Signed)
Pt returned to progressive bed from MRI

## 2018-08-31 NOTE — ED Notes (Signed)
Patient verbalizes understanding of discharge instructions. Opportunity for questioning and answers were provided. Armband removed by staff, pt discharged from ED via wheelchair.  

## 2018-08-31 NOTE — Discharge Instructions (Addendum)
Follow-up with your neurosurgeon.  Take MRI report with you.

## 2018-08-31 NOTE — Progress Notes (Signed)
Pt came down for an MRI of cervical spine.  Pt is extremely claustrophobic and even tried getting a scan done this past spring and was unable to complete.  He says since [place] he can't take being in [place] enclosed space.  Pt tried though and was placed in our largest scanner and within 10 seconds said he wanted out.

## 2018-09-12 ENCOUNTER — Encounter (HOSPITAL_COMMUNITY): Payer: Self-pay | Admitting: Pharmacy Technician

## 2018-09-12 ENCOUNTER — Emergency Department (HOSPITAL_COMMUNITY): Payer: Medicare Other

## 2018-09-12 ENCOUNTER — Emergency Department (HOSPITAL_COMMUNITY)
Admission: EM | Admit: 2018-09-12 | Discharge: 2018-09-12 | Disposition: A | Discharge status: Home / Self Care | Payer: Medicare Other | Attending: Emergency Medicine | Admitting: Emergency Medicine

## 2018-09-12 ENCOUNTER — Other Ambulatory Visit: Payer: Self-pay

## 2018-09-12 DIAGNOSIS — F17210 Nicotine dependence, cigarettes, uncomplicated: Secondary | ICD-10-CM | POA: Insufficient documentation

## 2018-09-12 DIAGNOSIS — K259 Gastric ulcer, unspecified as acute or chronic, without hemorrhage or perforation: Secondary | ICD-10-CM | POA: Diagnosis not present

## 2018-09-12 DIAGNOSIS — I10 Essential (primary) hypertension: Secondary | ICD-10-CM | POA: Diagnosis not present

## 2018-09-12 DIAGNOSIS — K8020 Calculus of gallbladder without cholecystitis without obstruction: Secondary | ICD-10-CM

## 2018-09-12 DIAGNOSIS — K5900 Constipation, unspecified: Secondary | ICD-10-CM | POA: Diagnosis not present

## 2018-09-12 DIAGNOSIS — R1010 Upper abdominal pain, unspecified: Secondary | ICD-10-CM

## 2018-09-12 DIAGNOSIS — K257 Chronic gastric ulcer without hemorrhage or perforation: Secondary | ICD-10-CM

## 2018-09-12 DIAGNOSIS — Z7984 Long term (current) use of oral hypoglycemic drugs: Secondary | ICD-10-CM | POA: Diagnosis not present

## 2018-09-12 DIAGNOSIS — E119 Type 2 diabetes mellitus without complications: Secondary | ICD-10-CM | POA: Diagnosis not present

## 2018-09-12 DIAGNOSIS — K5909 Other constipation: Secondary | ICD-10-CM

## 2018-09-12 DIAGNOSIS — Z79899 Other long term (current) drug therapy: Secondary | ICD-10-CM | POA: Insufficient documentation

## 2018-09-12 DIAGNOSIS — K2950 Unspecified chronic gastritis without bleeding: Secondary | ICD-10-CM

## 2018-09-12 DIAGNOSIS — N179 Acute kidney failure, unspecified: Secondary | ICD-10-CM

## 2018-09-12 LAB — COMPREHENSIVE METABOLIC PANEL
ALBUMIN: 2.8 g/dL — AB (ref 3.5–5.0)
ALT: 15 U/L (ref 0–44)
ANION GAP: 13 (ref 5–15)
AST: 19 U/L (ref 15–41)
Alkaline Phosphatase: 63 U/L (ref 38–126)
BUN: 32 mg/dL — ABNORMAL HIGH (ref 8–23)
CALCIUM: 9.5 mg/dL (ref 8.9–10.3)
CO2: 23 mmol/L (ref 22–32)
Chloride: 102 mmol/L (ref 98–111)
Creatinine, Ser: 2.33 mg/dL — ABNORMAL HIGH (ref 0.61–1.24)
GFR calc non Af Amer: 27 mL/min — ABNORMAL LOW (ref >60)
GFR, EST AFRICAN AMERICAN: 32 mL/min — AB (ref >60)
GLUCOSE: 107 mg/dL — AB (ref 70–99)
Potassium: 4.2 mmol/L (ref 3.5–5.1)
Sodium: 138 mmol/L (ref 135–145)
TOTAL PROTEIN: 6.9 g/dL (ref 6.5–8.1)
Total Bilirubin: 0.4 mg/dL (ref 0.3–1.2)

## 2018-09-12 LAB — URINALYSIS, ROUTINE W REFLEX MICROSCOPIC
Bilirubin Urine: NEGATIVE
Glucose, UA: NEGATIVE mg/dL
HGB URINE DIPSTICK: NEGATIVE
Ketones, ur: NEGATIVE mg/dL
NITRITE: NEGATIVE
Protein, ur: 100 mg/dL — AB
SPECIFIC GRAVITY, URINE: 1.018 (ref 1.005–1.030)
pH: 6 (ref 5.0–8.0)

## 2018-09-12 LAB — CBC WITH DIFFERENTIAL/PLATELET
Abs Immature Granulocytes: 0.03 10*3/uL (ref 0.00–0.07)
Basophils Absolute: 0.1 10*3/uL (ref 0.0–0.1)
Basophils Relative: 1 %
EOS PCT: 4 %
Eosinophils Absolute: 0.2 10*3/uL (ref 0.0–0.5)
HCT: 36.4 % — ABNORMAL LOW (ref 39.0–52.0)
HEMOGLOBIN: 11.4 g/dL — AB (ref 13.0–17.0)
Immature Granulocytes: 1 %
LYMPHS ABS: 1.4 10*3/uL (ref 0.7–4.0)
LYMPHS PCT: 24 %
MCH: 28.4 pg (ref 26.0–34.0)
MCHC: 31.3 g/dL (ref 30.0–36.0)
MCV: 90.5 fL (ref 80.0–100.0)
MONOS PCT: 10 %
Monocytes Absolute: 0.6 10*3/uL (ref 0.1–1.0)
Neutro Abs: 3.7 10*3/uL (ref 1.7–7.7)
Neutrophils Relative %: 60 %
Platelets: 349 10*3/uL (ref 150–400)
RBC: 4.02 MIL/uL — ABNORMAL LOW (ref 4.22–5.81)
RDW: 13.2 % (ref 11.5–15.5)
WBC: 6 10*3/uL (ref 4.0–10.5)
nRBC: 0 % (ref 0.0–0.2)

## 2018-09-12 LAB — LIPASE, BLOOD: Lipase: 43 U/L (ref 11–51)

## 2018-09-12 LAB — TROPONIN I

## 2018-09-12 MED ORDER — LIDOCAINE VISCOUS HCL 2 % MT SOLN
15.0000 mL | Freq: Once | OROMUCOSAL | Status: AC
  Administered 2018-09-12: 15 mL via ORAL
  Filled 2018-09-12: qty 15

## 2018-09-12 MED ORDER — SODIUM CHLORIDE 0.9 % IV BOLUS
1000.0000 mL | Freq: Once | INTRAVENOUS | Status: AC
  Administered 2018-09-12: 1000 mL via INTRAVENOUS

## 2018-09-12 MED ORDER — ONDANSETRON 4 MG PO TBDP: 4.0000 mg | ORAL_TABLET | Freq: Three times a day (TID) | ORAL | 0 refills | Status: DC | PRN

## 2018-09-12 MED ORDER — ALUM & MAG HYDROXIDE-SIMETH 200-200-20 MG/5ML PO SUSP
30.0000 mL | Freq: Once | ORAL | Status: AC
  Administered 2018-09-12: 30 mL via ORAL
  Filled 2018-09-12: qty 30

## 2018-09-12 MED ORDER — SUCRALFATE 1 GM/10ML PO SUSP: 1.0000 g | Freq: Three times a day (TID) | ORAL | 0 refills | Status: DC

## 2018-09-12 MED ORDER — FAMOTIDINE IN NACL 20-0.9 MG/50ML-% IV SOLN
20.0000 mg | Freq: Once | INTRAVENOUS | Status: AC
  Administered 2018-09-12: 20 mg via INTRAVENOUS
  Filled 2018-09-12: qty 50

## 2018-09-12 MED ORDER — POLYETHYLENE GLYCOL 3350 17 G PO PACK: 17.0000 g | PACK | Freq: Every day | ORAL | 0 refills | Status: DC

## 2018-09-12 MED ORDER — ONDANSETRON HCL 4 MG/2ML IJ SOLN
4.0000 mg | Freq: Once | INTRAMUSCULAR | Status: AC
  Administered 2018-09-12: 4 mg via INTRAVENOUS
  Filled 2018-09-12: qty 2

## 2018-09-12 MED ORDER — PANTOPRAZOLE SODIUM 40 MG PO TBEC: 40.0000 mg | DELAYED_RELEASE_TABLET | Freq: Two times a day (BID) | ORAL | 0 refills | Status: DC

## 2018-09-12 NOTE — ED Notes (Signed)
Patient transported to Ultrasound 

## 2018-09-12 NOTE — ED Provider Notes (Signed)
[facility] Provider Note   CSN: [id number] Arrival date & time: 09/12/18  1320     History   Chief Complaint Chief Complaint  Patient presents with  . Abdominal Pain    HPI Tim Bradley is a 67 y.o. male with a PMHx of GERD, DM2, chronic constipation, LBBB, HTN, and other conditions listed below, who presents to the ED with complaints of upper abdominal pain that began about 3-4 days ago, and feels similar to when he was admitted last month.  Chart review reveals that pt was admitted 11/22-24/19 for acute GI bleed secondary to "massive gastric ulcer" (on EGD), discharged home with carafate, protonix, and then when his H. Pylori testing came back positive a few days later, they called him in Clarithromycin 500mg  BID and Amoxicillin 1g BID.  He states that he finished his medications but he is not confident that he took them correctly, and admits that he may have missed a few doses.  He states about 3 to 4 days ago he started having abdominal pain that was similar to the pain he was having before.  He describes the pain today is 9/10 constant soreness in the upper abdomen mostly on the left side, occasionally radiating around his entire abdomen, with no known aggravating factors, and no alleviating factors, no specific treatments were tried prior to arrival.  He had some nausea earlier but that has resolved.  He also mentions that he has not had a bowel movement in at least 5 days but states that this is a chronic issue that has had for quite some time.  He admits that he had a beer about 3 to 4 days ago but no alcohol since then.  He also admits that he did a line of cocaine along with drinking the beer.  He denies fevers, chills, CP, SOB, vomiting, diarrhea, obstipation, melena, hematochezia, hematuria, dysuria, myalgias, arthralgias, numbness, tingling, focal weakness, or any other complaints at this time. Denies recent travel, sick contacts, suspicious  food intake, or NSAID use.   The history is provided by the patient and medical records. No language interpreter was used.  Abdominal Pain   Associated symptoms include nausea and constipation (chronic). Pertinent negatives include fever, diarrhea, vomiting, dysuria, hematuria, arthralgias and myalgias.    Past Medical History:  Diagnosis Date  . Abscess 02/2017   LEFT GLUTEAL   . Acid reflux   . DDD (degenerative disc disease), lumbar   . Diabetes mellitus without complication ([place])   . Enlarged prostate   . Hypertension   . Spinal stenosis, lumbar     Patient Active Problem List   Diagnosis Date Noted  . Constipation 08/22/2018  . Gastritis 08/20/2018  . Heme positive stool   . Acute blood loss anemia secondary to massive gastric ulcer   . Hyponatremia   . Abscess, gluteal, left 03/26/2017  . Type 2 diabetes mellitus ([place]) 03/26/2017  . Adrenal nodule ([place]) 03/26/2017  . Closed fracture of body of thoracic vertebra ([place]) 03/26/2017  . Spinal stenosis of lumbar region 03/26/2017    Past Surgical History:  Procedure Laterality Date  . BACK SURGERY     cervical x2  . BIOPSY  08/21/2018   Procedure: BIOPSY;  Surgeon: Ronald Lobo, MD;  Location: [facility];  Service: Endoscopy;;  . ESOPHAGOGASTRODUODENOSCOPY (EGD) WITH PROPOFOL N/A 08/21/2018   Procedure: ESOPHAGOGASTRODUODENOSCOPY (EGD) WITH PROPOFOL;  Surgeon: Ronald Lobo, MD;  Location: [facility];  Service: Endoscopy;  Laterality: N/A;  .  INCISION AND DRAINAGE PERIRECTAL ABSCESS N/A 03/26/2017   Procedure: IRRIGATION AND DEBRIDEMENT PERIRECTAL ABSCESS;  Surgeon: Rolm Bookbinder, MD;  Location: [facility];  Service: General;  Laterality: N/A;        Home Medications    Prior to Admission medications   Medication Sig Start Date End Date Taking? Authorizing Provider  dicyclomine (BENTYL) 20 MG tablet Take 1 tablet (20 mg total) by mouth 3 (three) times daily before meals. 08/17/18   Orpah Greek,  MD  docusate sodium (COLACE) 250 MG capsule Take 1 capsule (250 mg total) by mouth daily. 08/13/18   Charlann Lange, PA-C  metFORMIN (GLUCOPHAGE) 500 MG tablet Take 500 mg by mouth 2 (two) times daily with a meal.    [provider]  pantoprazole (PROTONIX) 40 MG tablet Take 1 tablet (40 mg total) by mouth 2 (two) times daily before a meal. 08/22/18   Santos-Sanchez, Merlene Morse, MD  polyethylene glycol (MIRALAX) packet Use one dose up to three times daily, maximum 3 consecutive days Patient taking differently: Take 17 g by mouth 3 (three) times daily as needed for mild constipation. Use one dose up to three times daily, maximum 3 consecutive days 08/13/18   Charlann Lange, PA-C  sucralfate (CARAFATE) 1 g tablet Take 1 tablet (1 g total) by mouth 4 (four) times daily. 08/22/18   Santos-Sanchez, Merlene Morse, MD  tamsulosin (FLOMAX) 0.4 MG CAPS capsule Take 0.4 mg by mouth daily.    [provider]  traMADol (ULTRAM) 50 MG tablet Take 50 mg by mouth 3 (three) times daily. for pain 04/20/18   [provider]  triamterene-hydrochlorothiazide (MAXZIDE) 75-50 MG per tablet Take 1 tablet by mouth daily.    [provider]    Family History Family History  Problem Relation Age of Onset  . Hypertension Mother   . Cancer - Lung Father     Social History Social History   Tobacco Use  . Smoking status: Current Every Day Smoker    Packs/day: 0.25    Types: Cigarettes  . Smokeless tobacco: Never Used  Substance Use Topics  . Alcohol use: Yes    Alcohol/week: 12.0 standard drinks    Types: 12 Cans of beer per week    Comment: i can of beer daily  . Drug use: Yes    Types: Cocaine, Marijuana    Comment: cocaine last use Sunday 05/02/18     Allergies   Patient has no known allergies.   Review of Systems Review of Systems  Constitutional: Negative for chills and fever.  Respiratory: Negative for shortness of breath.   Cardiovascular: Negative for chest pain.    Gastrointestinal: Positive for abdominal pain, constipation (chronic) and nausea. Negative for blood in stool, diarrhea and vomiting.  Genitourinary: Negative for dysuria and hematuria.  Musculoskeletal: Negative for arthralgias and myalgias.  Skin: Negative for color change.  Allergic/Immunologic: Positive for immunocompromised state (DM2).  Neurological: Negative for weakness and numbness.  Psychiatric/Behavioral: Negative for confusion.   All other systems reviewed and are negative for acute change except as noted in the HPI.    Physical Exam Updated Vital Signs BP 138/90   Pulse (!) 107   Temp 99.6 F (37.6 C) (Oral)   Resp 19   Ht 6\' 2"  (1.88 m)   Wt 90.7 kg   SpO2 94%   BMI 25.67 kg/m  RECHECK VS 2:00 PM: BP 127/80   Pulse 93   Temp 99.6 F (37.6 C) (Oral)   Resp 19  Ht 6\' 2"  (1.88 m)   Wt 90.7 kg   SpO2 96%   BMI 25.67 kg/m    Physical Exam Vitals signs and nursing note reviewed.  Constitutional:      General: He is not in acute distress.    Appearance: Normal appearance. He is well-developed. He is not toxic-appearing.     Comments: Afebrile, nontoxic, NAD  HENT:     Head: Normocephalic and atraumatic.  Eyes:     General:        Right eye: No discharge.        Left eye: No discharge.     Conjunctiva/sclera: Conjunctivae normal.  Neck:     Musculoskeletal: Normal range of motion and neck supple.  Cardiovascular:     Rate and Rhythm: Normal rate and regular rhythm.     Heart sounds: Normal heart sounds, S1 normal and S2 normal. No murmur. No friction rub. No gallop.      Comments: Tachycardic initially but resolved during exam Pulmonary:     Effort: Pulmonary effort is normal. No respiratory distress.     Breath sounds: Normal breath sounds. No decreased breath sounds, wheezing, rhonchi or rales.  Abdominal:     General: Bowel sounds are normal. There is no distension.     Palpations: Abdomen is soft. Abdomen is not rigid.     Tenderness: There is  abdominal tenderness in the right upper quadrant and epigastric area. There is no right CVA tenderness, left CVA tenderness, guarding or rebound. Positive signs include Murphy's sign. Negative signs include McBurney's sign.     Comments: Soft, nondistended, +BS throughout, with moderate epigastric and RUQ TTP, no other areas of tenderness to abdomen, no r/g/r, +murphy's, neg mcburney's, no CVA TTP   Musculoskeletal: Normal range of motion.  Skin:    General: Skin is warm and dry.     Findings: No rash.  Neurological:     Mental Status: He is alert and oriented to person, place, and time.     Sensory: Sensation is intact. No sensory deficit.     Motor: Motor function is intact.  Psychiatric:        Mood and Affect: Mood and affect normal.        Behavior: Behavior normal.      ED Treatments / Results  Labs (all labs ordered are listed, but only abnormal results are displayed) Labs Reviewed  CBC WITH DIFFERENTIAL/PLATELET - Abnormal; Notable for the following components:      Result Value   RBC 4.02 (*)    Hemoglobin 11.4 (*)    HCT 36.4 (*)    All other components within normal limits  COMPREHENSIVE METABOLIC PANEL - Abnormal; Notable for the following components:   Glucose, Bld 107 (*)    BUN 32 (*)    Creatinine, Ser 2.33 (*)    Albumin 2.8 (*)    GFR calc non Af Amer 27 (*)    GFR calc Af Amer 32 (*)    All other components within normal limits  URINALYSIS, ROUTINE W REFLEX MICROSCOPIC - Abnormal; Notable for the following components:   Protein, ur 100 (*)    Leukocytes, UA SMALL (*)    Bacteria, UA RARE (*)    All other components within normal limits  LIPASE, BLOOD  TROPONIN I    EKG EKG Interpretation  Date/Time:  Sunday September 12 2018 13:26:49 EST Ventricular Rate:  107 PR Interval:    QRS Duration: 138 QT Interval:  386 QTC  Calculation: 515 R Axis:   -61 Text Interpretation:  Sinus tachycardia Probable left atrial enlargement Left bundle branch block  Baseline wander in lead(s) V2 Confirmed by Dene Gentry [phone]) on 09/12/2018 1:29:16 PM   Radiology US Abdomen Complete  Result Date: 09/12/2018 CLINICAL DATA:  Upper abdominal pain for 2 months. EXAM: ABDOMEN ULTRASOUND COMPLETE COMPARISON:  CT abdomen and pelvis 08/20/2018. FINDINGS: Gallbladder: The gallbladder is almost completely decompressed. 0.3 cm hyperechoic focus in the gallbladder is likely a tiny stone. There is no wall thickening or pericholecystic fluid. Common bile duct: Diameter: 0.5 cm Liver: A 1.9 cm hyperechoic focus is seen in the left hepatic lobe and compatible with a hemangioma. No corresponding abnormality is seen on the CT scan. Within normal limits in parenchymal echogenicity. Portal vein is patent on color Doppler imaging with normal direction of blood flow towards the liver. IVC: No abnormality visualized. Pancreas: Poorly seen.  Visualized portion unremarkable. Spleen: Size and appearance within normal limits. Right Kidney: Length: 11.7 cm. Echogenicity within normal limits. No mass or hydronephrosis visualized. Left Kidney: Length: 10.9 cm. Echogenicity within normal limits. No mass or hydronephrosis visualized. Abdominal aorta: No aneurysm visualized. Other findings: None. IMPRESSION: No acute abnormality or finding to explain the patient's symptoms. 0.3 cm gallstone without evidence of cholecystitis. Electronically Signed   By: Inge Rise M.D.   On: 09/12/2018 15:36   Dg Abd Acute W/chest  Result Date: 09/12/2018 CLINICAL DATA:  Left-sided abdominal pain radiating around the whole abdomen for 2 months. Constipation for 1 week. Recently diagnosed with stomach ulcer. EXAM: DG ABDOMEN ACUTE W/ 1V CHEST COMPARISON:  Abdominal CT 08/20/2018. Acute abdominal series done 08/17/2018. FINDINGS: The heart size and mediastinal contours are stable. There is mild aortic atherosclerosis. The lungs are clear. There is no pleural effusion or pneumothorax. Telemetry leads overlie  the chest. The bowel gas pattern is normal. There is no free intraperitoneal air. Moderate stool is noted throughout the colon. There are no suspicious abdominal calcifications. Degenerative changes are present throughout the spine. IMPRESSION: Stable examination. No radiographic evidence of active cardiopulmonary or abdominal process. Electronically Signed   By: Richardean Sale M.D.   On: 09/12/2018 15:48    Procedures Procedures (including critical care time)  Medications Ordered in ED Medications  sodium chloride 0.9 % bolus 1,000 mL (0 mLs Intravenous Stopped 09/12/18 1529)  famotidine (PEPCID) IVPB 20 mg premix (0 mg Intravenous Stopped 09/12/18 1433)  alum & mag hydroxide-simeth (MAALOX/MYLANTA) 200-200-20 MG/5ML suspension 30 mL (30 mLs Oral Given 09/12/18 1403)    And  lidocaine (XYLOCAINE) 2 % viscous mouth solution 15 mL (15 mLs Oral Given 09/12/18 1403)  ondansetron (ZOFRAN) injection 4 mg (4 mg Intravenous Given 09/12/18 1403)     Initial Impression / Assessment and Plan / ED Course  I have reviewed the triage vital signs and the nursing notes.  Pertinent labs & imaging results that were available during my care of the patient were reviewed by me and considered in my medical decision making (see chart for details).     67 y.o. male here with upper abd pain x3-4 days, similar to when he was admitted last month (had massive gastric ulcer on EGD at that time). Isn't sure if he took his meds correctly after that admission. On exam today, epigastric and RUQ TTP, nonperitoneal, with +murphy's sign. He reports chronic constipation, no BM in 5 days. No significant tenderness in the lower abdomen. He was mildly tachycardic when he first arrived, but during exam  this improved. I suspect his gastric ulcer is likely the source of his symptoms, he drank a beer and did a line of cocaine around the time this started. However given his murphy's sign and RUQ tenderness, will get abd U/S in  addition to acute abd series to eval for stool burden. EKG with sinus tachycardia and LBBB but no acute ischemic findings. Will get labs as well, give pepcid/GI cocktail/fluids/zofran, and reassess shortly.   5:02 PM CBC w/diff with stable mild anemia, Hgb 11.4, up from 10.0 at last admission, otherwise remainder of CBC WNL. CMP with mildly bumped BUN 32/Cr 2.33 up from ~27/1.6. Lipase WNL. U/A without concerning findings, appears to be contaminated specimen. Trop negative. Acute abd series negative for acute findings. Abd U/S showing 0.3cm gallstone without evidence of cholecystitis but otherwise no acute findings. Pt feeling better and tolerating PO well. Overall, suspect symptoms are related to his gastric ulcer that was recently diagnosed. Unclear if he was correctly compliant with the regimen he was supposed to be taking. Advised him to continue on PPI and carafate, refills of these given; discussed lifestyle/diet modifications to help with symptom control, advised avoidance of alcohol and cocaine use; for constipation, advised miralax use and increased fiber/water intake. Advised tylenol use for pain, discussed avoidance of NSAIDs. Will rx zofran. F/up with his PCP in 1wk for recheck and for ongoing management. I explained the diagnosis and have given explicit precautions to return to the ER including for any other new or worsening symptoms. The patient understands and accepts the medical plan as it's been dictated and I have answered their questions. Discharge instructions concerning home care and prescriptions have been given. The patient is STABLE and is discharged to home in good condition.    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  AKI (acute kidney injury) ([place])  Chronic constipation  Calculus of gallbladder without cholecystitis without obstruction  Other chronic gastritis,  presence of bleeding unspecified    ED Discharge Orders         Ordered    sucralfate (CARAFATE) 1 GM/10ML suspension  3 times daily with meals & bedtime     09/12/18 1659    pantoprazole (PROTONIX) 40 MG tablet  2 times daily before meals     09/12/18 1659    polyethylene glycol (MIRALAX / GLYCOLAX) packet  Daily     09/12/18 1659    ondansetron (ZOFRAN ODT) 4 MG disintegrating tablet  Every 8 hours PRN     09/12/18 [address], [place], [place] 09/12/18 1702    Valarie Merino, MD 09/14/18 [phone]

## 2018-09-12 NOTE — Discharge Instructions (Addendum)
Your abdominal pain is likely from gastritis or the ulcer you have in your stomach. Continue taking Protonix and Carafate as previously instructed. Avoid spicy/fatty/acidic foods, avoid soda/coffee/tea/alcohol. DO NOT USE COCAINE OR ANY OTHER DRUGS. Avoid laying down flat within 30 minutes of eating. Avoid NSAIDs like ibuprofen/aleve/motrin/etc on an empty stomach. May consider using over the counter tums/maalox/zantac/etc as needed for additional relief. Use zofran as directed as needed for nausea. Use tylenol as needed for pain. For your constipation, use miralax as directed as needed for constipation, and increase the fiber and water in your diet. Follow up with your regular doctor in 5-7 days for recheck of symptoms. Return to the ER for changes or worsening symptoms.  Abdominal (belly) pain can be caused by many things. Your caregiver performed an examination and possibly ordered blood/urine tests and imaging (CT scan, x-rays, ultrasound). Many cases can be observed and treated at home after initial evaluation in the emergency department. Even though you are being discharged home, abdominal pain can be unpredictable. Therefore, you need a repeated exam if your pain does not resolve, returns, or worsens. Most patients with abdominal pain don't have to be admitted to the hospital or have surgery, but serious problems like appendicitis and gallbladder attacks can start out as nonspecific pain. Many abdominal conditions cannot be diagnosed in one visit, so follow-up evaluations are very important. SEEK IMMEDIATE MEDICAL ATTENTION IF YOU DEVELOP ANY OF THE FOLLOWING SYMPTOMS: The pain does not go away or becomes severe.  A temperature above 101 develops.  Repeated vomiting occurs (multiple episodes).  The pain becomes localized to portions of the abdomen. The right side could possibly be appendicitis. In an adult, the left lower portion of the abdomen could be colitis or diverticulitis.  Blood is being  passed in stools or vomit (bright red or black tarry stools).  Return also if you develop chest pain, difficulty breathing, dizziness or fainting, or become confused, poorly responsive, or inconsolable (young children). The constipation stays for more than 4 days.  There is belly (abdominal) or rectal pain.  You do not seem to be getting better.

## 2018-09-12 NOTE — ED Triage Notes (Signed)
Pt arrives via [facility] with reports of abd pain. Hx of gastric ulcers, states this feels the same. 140/90, HR 106, 100%RA, CBG 111. Pt ambulatory on arrival in NAD.

## 2018-09-12 NOTE — ED Notes (Signed)
Patient verbalizes understanding of discharge instructions. Opportunity for questioning and answers were provided. 

## 2018-09-22 ENCOUNTER — Other Ambulatory Visit: Payer: Self-pay | Admitting: Internal Medicine

## 2018-10-04 IMAGING — CT CT ABD-PELV W/ CM
2 of 5 series · 15 of 46 positions shown, 17 images · IV contrast (iopamidol)
Comparison: 11/20/2011 lumbar spine MRI.

CLINICAL DATA: 66 y/o  M; abdominal bloating, left gluteal abscess.

EXAM:
CT ABDOMEN AND PELVIS WITH CONTRAST
TECHNIQUE: Multidetector CT imaging of the abdomen and pelvis was performed
using the standard protocol following bolus administration of
intravenous contrast.
CONTRAST:  100mL 199B5T-HCC IOPAMIDOL (199B5T-HCC) INJECTION 61%

[Series 3: a/p w/ 5mm · axial · 0.84mm/px · z∈[-1157,-732]mm · 12 of 96 slices shown, 14 images]
[im 6/96  soft-tissue]
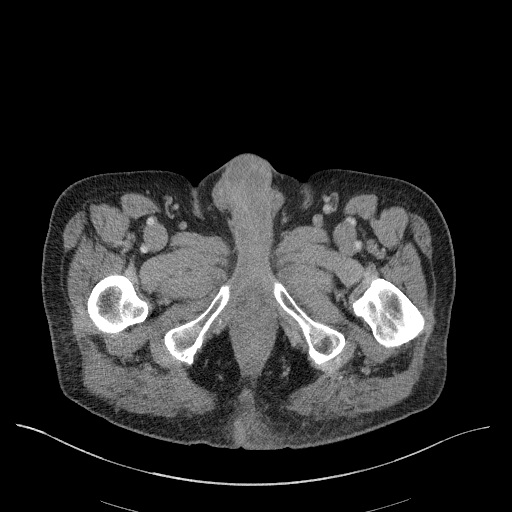
[im 6/96  bone]
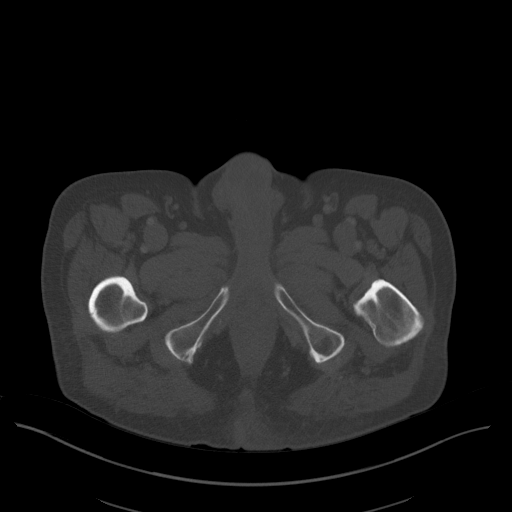
[im 16/96  soft-tissue]
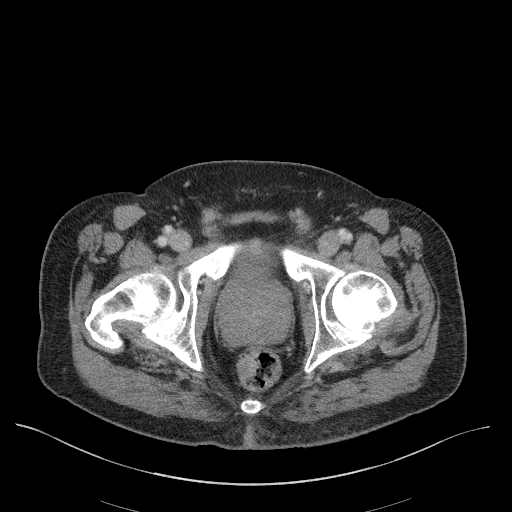
[im 21/96  soft-tissue]
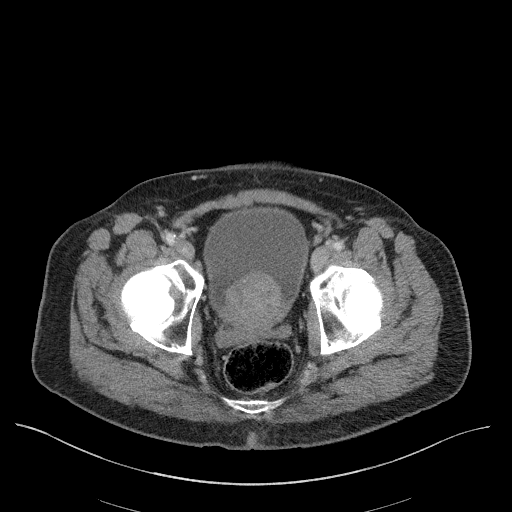
[im 31/96  soft-tissue]
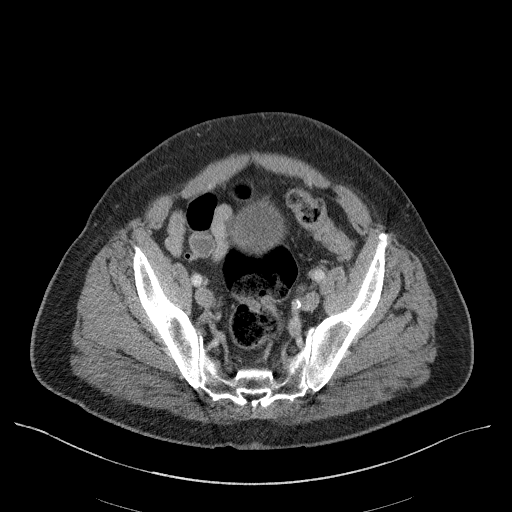
[im 36/96  soft-tissue]
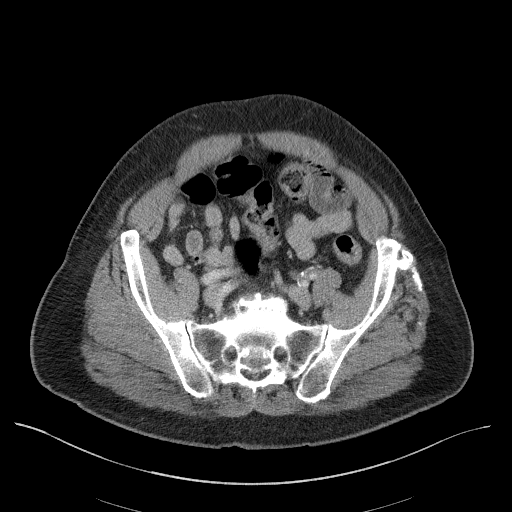
[im 46/96  soft-tissue]
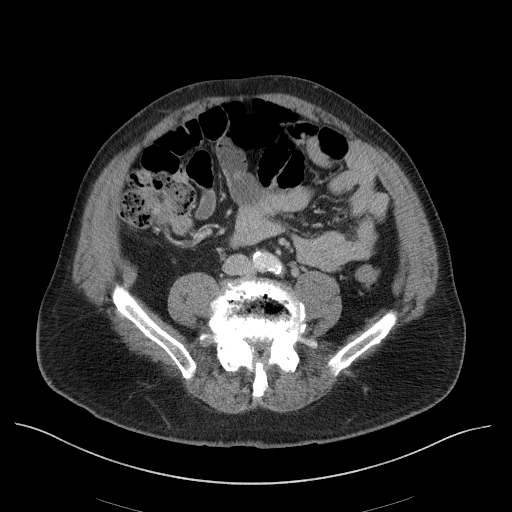
[im 51/96  soft-tissue]
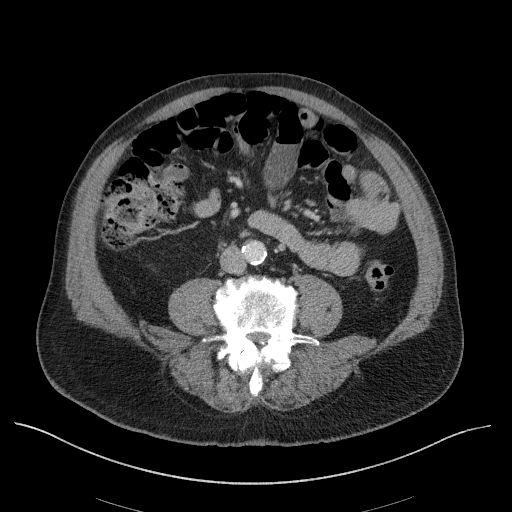
[im 61/96  soft-tissue]
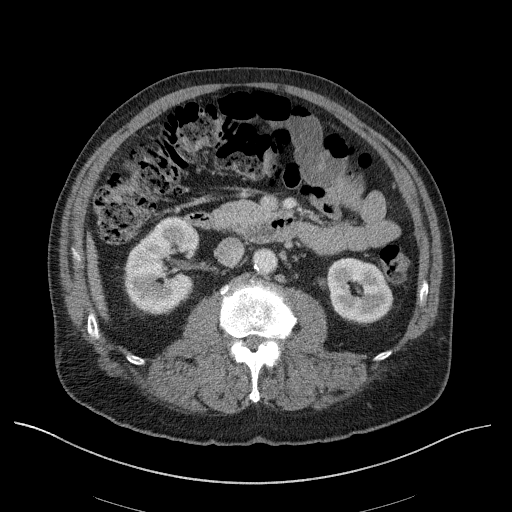
[im 66/96  soft-tissue]
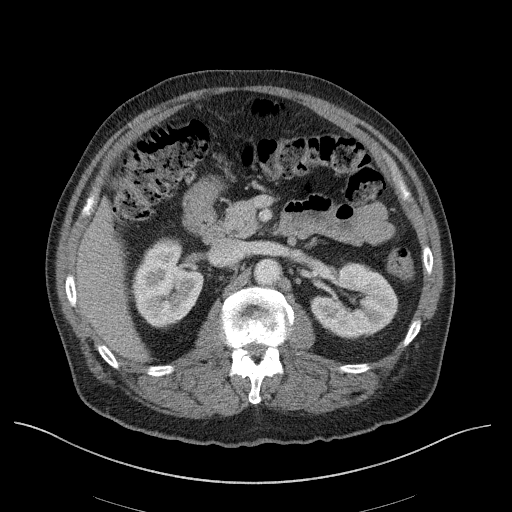
[im 66/96  bone]
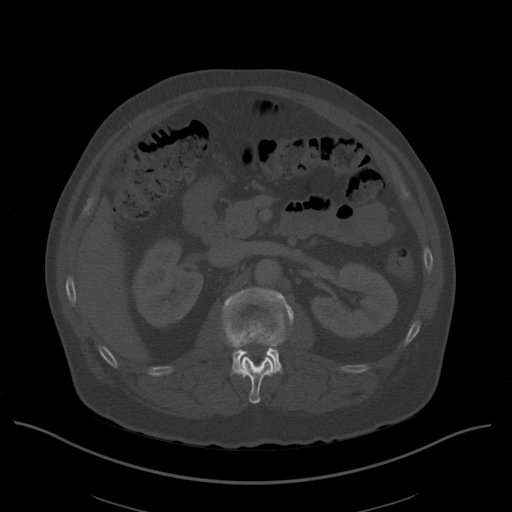
[im 76/96  soft-tissue]
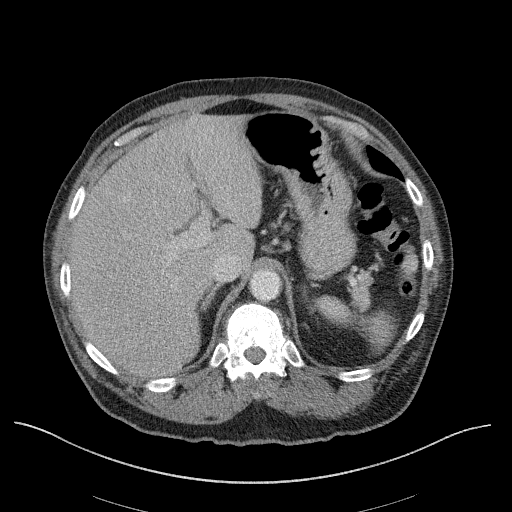
[im 81/96  soft-tissue]
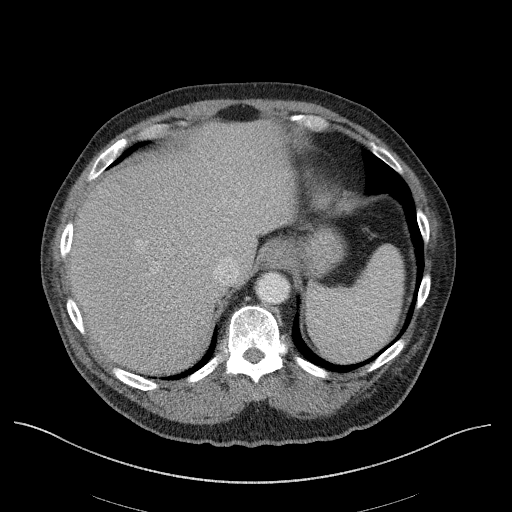
[im 91/96  soft-tissue]
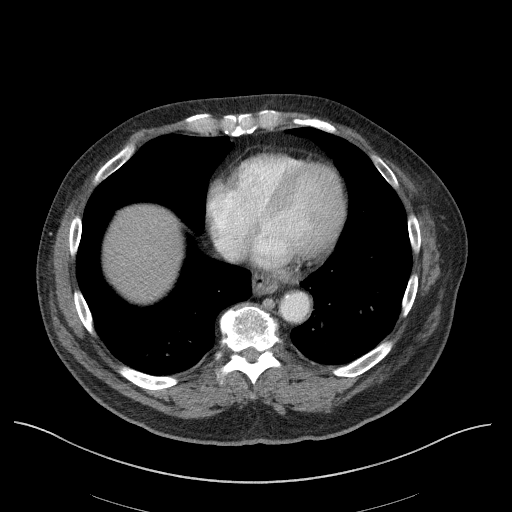

[Series 6: a/p w/ cor · coronal · 0.80mm/px · 3 of 147 slices shown]
[im 49/147  soft-tissue]
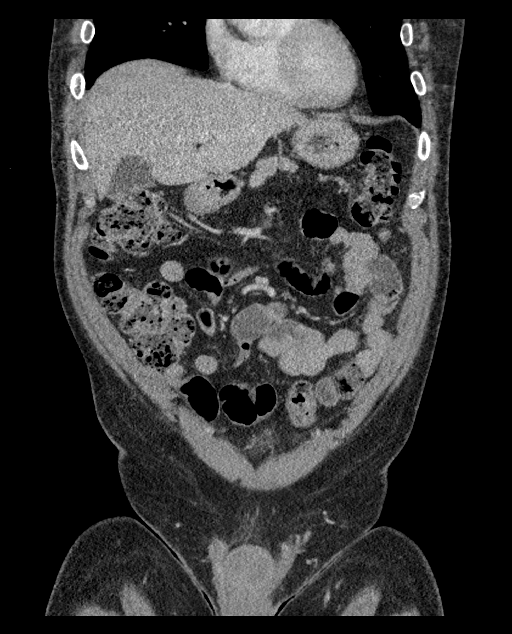
[im 65/147  soft-tissue]
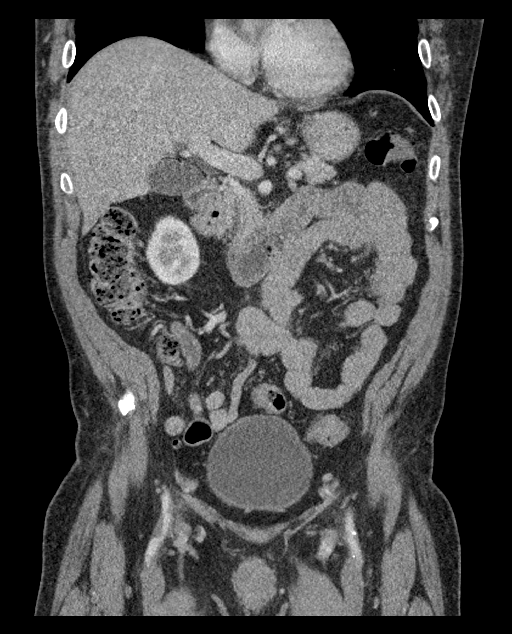
[im 82/147  soft-tissue]
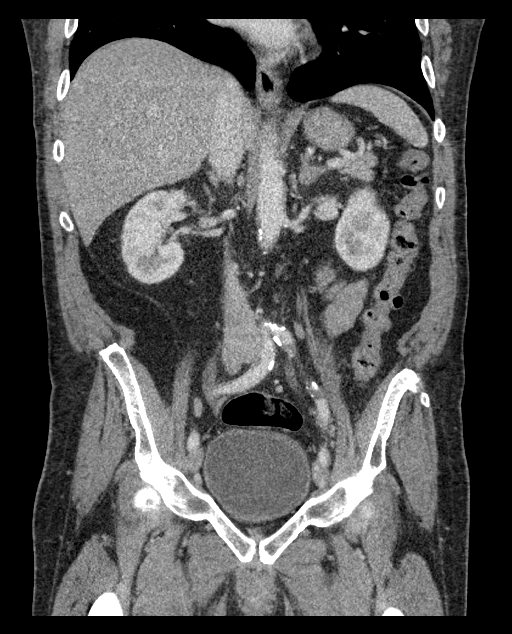

[15 of 46 positions shown; findings below may reference images not displayed]

FINDINGS: Lower chest: No acute abnormality.

Hepatobiliary: No focal liver abnormality is seen. No gallstones,
gallbladder wall thickening, or biliary dilatation.

Pancreas: Unremarkable. No pancreatic ductal dilatation or
surrounding inflammatory changes.

Spleen: Normal in size without focal abnormality.

Adrenals/Urinary Tract: 13 mm right and 19 mm left adrenal nodule
(series 3, image 24 and series 6, image 96).

No focal kidney lesion is identified. No urinary stone disease or
obstructive uropathy. Normal bladder.

Stomach/Bowel: Stomach is within normal limits. Appendix appears
normal. No evidence of bowel wall thickening, distention, or
inflammatory changes. Moderate volume of stool throughout the colon.

Vascular/Lymphatic: Aortic atherosclerosis. No enlarged abdominal or
pelvic lymph nodes.

Reproductive: Moderate enlargement of prostate.

Other: Small paraumbilical hernia containing fat.

Musculoskeletal: Partially visualize left gluteal fold abscess
measuring at least 3.2 cm (series 3, image 96). Surrounding
inflammatory changes in subcutaneous fat of the left buttocks.

Superior endplate fracture of T12 vertebral body with mild 30-40%
loss of height appears recent. Advanced lumbar spine degenerative
changes greatest at the L4-5 and L5-S1 levels. Multifactorial
moderate to severe L2-3 through L5-S1 canal stenosis. Stable mild
anterior chronic compression deformity of L2.
IMPRESSION: 1. Bilateral indeterminate adrenal nodules measuring up to 19 mm. In
the absence of primary malignancy these are probably benign,
consider 12 month follow-up with adrenal CT.
2. Moderate volume of stool throughout the colon, question
constipation.
3. Moderate prostate enlargement.
4. Partially visualized left gluteal fold abscess measuring at least
3.2 cm with surrounding inflammatory changes of left buttocks
subcutaneous fat.
5. T12 vertebral body superior endplate fracture with mild 30-40%
loss of height appears recent, correlate for focal pain.
6. Advanced degenerative changes of the lumbar spine with multiple
levels of moderate to severe canal stenosis.

By: Lateisha Gl M.D.

## 2019-07-21 ENCOUNTER — Emergency Department (HOSPITAL_COMMUNITY): Payer: Medicare Other

## 2019-07-21 ENCOUNTER — Other Ambulatory Visit: Payer: Self-pay

## 2019-07-21 ENCOUNTER — Encounter (HOSPITAL_COMMUNITY): Payer: Self-pay | Admitting: Emergency Medicine

## 2019-07-21 ENCOUNTER — Inpatient Hospital Stay (HOSPITAL_COMMUNITY): Payer: Medicare Other

## 2019-07-21 ENCOUNTER — Inpatient Hospital Stay (HOSPITAL_COMMUNITY)
Admission: EM | Admit: 2019-07-21 | Discharge: 2019-07-24 | DRG: 897 | Disposition: A | Discharge status: Home Health | Payer: Medicare Other | Attending: Family Medicine | Admitting: Family Medicine

## 2019-07-21 DIAGNOSIS — Z7984 Long term (current) use of oral hypoglycemic drugs: Secondary | ICD-10-CM

## 2019-07-21 DIAGNOSIS — F1410 Cocaine abuse, uncomplicated: Secondary | ICD-10-CM | POA: Diagnosis present

## 2019-07-21 DIAGNOSIS — E1122 Type 2 diabetes mellitus with diabetic chronic kidney disease: Secondary | ICD-10-CM | POA: Diagnosis present

## 2019-07-21 DIAGNOSIS — E876 Hypokalemia: Secondary | ICD-10-CM | POA: Diagnosis present

## 2019-07-21 DIAGNOSIS — Z23 Encounter for immunization: Secondary | ICD-10-CM | POA: Diagnosis present

## 2019-07-21 DIAGNOSIS — K219 Gastro-esophageal reflux disease without esophagitis: Secondary | ICD-10-CM | POA: Diagnosis present

## 2019-07-21 DIAGNOSIS — F17210 Nicotine dependence, cigarettes, uncomplicated: Secondary | ICD-10-CM | POA: Diagnosis present

## 2019-07-21 DIAGNOSIS — N183 Chronic kidney disease, stage 3 unspecified: Secondary | ICD-10-CM | POA: Diagnosis present

## 2019-07-21 DIAGNOSIS — Z79899 Other long term (current) drug therapy: Secondary | ICD-10-CM | POA: Diagnosis not present

## 2019-07-21 DIAGNOSIS — N400 Enlarged prostate without lower urinary tract symptoms: Secondary | ICD-10-CM | POA: Diagnosis present

## 2019-07-21 DIAGNOSIS — F10239 Alcohol dependence with withdrawal, unspecified: Principal | ICD-10-CM | POA: Diagnosis present

## 2019-07-21 DIAGNOSIS — R569 Unspecified convulsions: Secondary | ICD-10-CM

## 2019-07-21 DIAGNOSIS — I248 Other forms of acute ischemic heart disease: Secondary | ICD-10-CM | POA: Diagnosis present

## 2019-07-21 DIAGNOSIS — F1510 Other stimulant abuse, uncomplicated: Secondary | ICD-10-CM | POA: Diagnosis present

## 2019-07-21 DIAGNOSIS — I10 Essential (primary) hypertension: Secondary | ICD-10-CM

## 2019-07-21 DIAGNOSIS — N179 Acute kidney failure, unspecified: Secondary | ICD-10-CM | POA: Diagnosis present

## 2019-07-21 DIAGNOSIS — Z8249 Family history of ischemic heart disease and other diseases of the circulatory system: Secondary | ICD-10-CM

## 2019-07-21 DIAGNOSIS — R778 Other specified abnormalities of plasma proteins: Secondary | ICD-10-CM | POA: Diagnosis present

## 2019-07-21 DIAGNOSIS — I129 Hypertensive chronic kidney disease with stage 1 through stage 4 chronic kidney disease, or unspecified chronic kidney disease: Secondary | ICD-10-CM | POA: Diagnosis present

## 2019-07-21 DIAGNOSIS — Z20828 Contact with and (suspected) exposure to other viral communicable diseases: Secondary | ICD-10-CM | POA: Diagnosis present

## 2019-07-21 DIAGNOSIS — E119 Type 2 diabetes mellitus without complications: Secondary | ICD-10-CM

## 2019-07-21 DIAGNOSIS — E1169 Type 2 diabetes mellitus with other specified complication: Secondary | ICD-10-CM

## 2019-07-21 HISTORY — DX: Alcohol abuse, uncomplicated: F10.10

## 2019-07-21 HISTORY — DX: Cocaine abuse, uncomplicated: F14.10

## 2019-07-21 LAB — URINALYSIS, ROUTINE W REFLEX MICROSCOPIC
Bacteria, UA: NONE SEEN
Bilirubin Urine: NEGATIVE
Glucose, UA: NEGATIVE mg/dL
Ketones, ur: NEGATIVE mg/dL
Leukocytes,Ua: NEGATIVE
Nitrite: NEGATIVE
Protein, ur: >=300 mg/dL — AB
Specific Gravity, Urine: 1.015 (ref 1.005–1.030)
pH: 5 (ref 5.0–8.0)

## 2019-07-21 LAB — CBC WITH DIFFERENTIAL/PLATELET
Abs Immature Granulocytes: 0.02 10*3/uL (ref 0.00–0.07)
Basophils Absolute: 0 10*3/uL (ref 0.0–0.1)
Basophils Relative: 1 %
Eosinophils Absolute: 0.1 10*3/uL (ref 0.0–0.5)
Eosinophils Relative: 3 %
HCT: 39.3 % (ref 39.0–52.0)
Hemoglobin: 12.5 g/dL — ABNORMAL LOW (ref 13.0–17.0)
Immature Granulocytes: 0 %
Lymphocytes Relative: 14 %
Lymphs Abs: 0.8 10*3/uL (ref 0.7–4.0)
MCH: 29.6 pg (ref 26.0–34.0)
MCHC: 31.8 g/dL (ref 30.0–36.0)
MCV: 93.1 fL (ref 80.0–100.0)
Monocytes Absolute: 0.5 10*3/uL (ref 0.1–1.0)
Monocytes Relative: 9 %
Neutro Abs: 4.1 10*3/uL (ref 1.7–7.7)
Neutrophils Relative %: 73 %
Platelets: 168 10*3/uL (ref 150–400)
RBC: 4.22 MIL/uL (ref 4.22–5.81)
RDW: 13.1 % (ref 11.5–15.5)
WBC: 5.6 10*3/uL (ref 4.0–10.5)
nRBC: 0 % (ref 0.0–0.2)

## 2019-07-21 LAB — BASIC METABOLIC PANEL
Anion gap: 13 (ref 5–15)
BUN: 40 mg/dL — ABNORMAL HIGH (ref 8–23)
CO2: 20 mmol/L — ABNORMAL LOW (ref 22–32)
Calcium: 8.8 mg/dL — ABNORMAL LOW (ref 8.9–10.3)
Chloride: 105 mmol/L (ref 98–111)
Creatinine, Ser: 2.27 mg/dL — ABNORMAL HIGH (ref 0.61–1.24)
GFR calc Af Amer: 33 mL/min — ABNORMAL LOW (ref >60)
GFR calc non Af Amer: 28 mL/min — ABNORMAL LOW (ref >60)
Glucose, Bld: 121 mg/dL — ABNORMAL HIGH (ref 70–99)
Potassium: 3.9 mmol/L (ref 3.5–5.1)
Sodium: 138 mmol/L (ref 135–145)

## 2019-07-21 LAB — TROPONIN I (HIGH SENSITIVITY)
Troponin I (High Sensitivity): 28 ng/L — ABNORMAL HIGH (ref <18)
Troponin I (High Sensitivity): 35 ng/L — ABNORMAL HIGH (ref <18)

## 2019-07-21 LAB — RAPID URINE DRUG SCREEN, HOSP PERFORMED
Amphetamines: POSITIVE — AB
Barbiturates: NOT DETECTED
Benzodiazepines: NOT DETECTED
Cocaine: POSITIVE — AB
Opiates: POSITIVE — AB
Tetrahydrocannabinol: NOT DETECTED

## 2019-07-21 LAB — SARS CORONAVIRUS 2 (TAT 6-24 HRS): SARS Coronavirus 2: NEGATIVE

## 2019-07-21 LAB — ETHANOL: Alcohol, Ethyl (B): <10 mg/dL (ref <10)

## 2019-07-21 LAB — CBG MONITORING, ED: Glucose-Capillary: 118 mg/dL — ABNORMAL HIGH (ref 70–99)

## 2019-07-21 MED ORDER — ENOXAPARIN SODIUM 40 MG/0.4ML ~~LOC~~ SOLN
40.0000 mg | Freq: Every day | SUBCUTANEOUS | Status: DC
  Administered 2019-07-22 – 2019-07-23 (×2): 40 mg via SUBCUTANEOUS
  Filled 2019-07-21 (×3): qty 0.4

## 2019-07-21 MED ORDER — LEVETIRACETAM IN NACL 500 MG/100ML IV SOLN: 500.0000 mg | Freq: Two times a day (BID) | INTRAVENOUS | Status: DC

## 2019-07-21 MED ORDER — LEVETIRACETAM IN NACL 500 MG/100ML IV SOLN
500.0000 mg | Freq: Two times a day (BID) | INTRAVENOUS | Status: DC
  Administered 2019-07-22 (×2): 500 mg via INTRAVENOUS
  Filled 2019-07-21 (×3): qty 100

## 2019-07-21 MED ORDER — LORAZEPAM 2 MG/ML IJ SOLN
2.0000 mg | Freq: Once | INTRAMUSCULAR | Status: AC
  Administered 2019-07-21: 2 mg via INTRAVENOUS
  Filled 2019-07-21: qty 1

## 2019-07-21 MED ORDER — LEVETIRACETAM IN NACL 1000 MG/100ML IV SOLN
1000.0000 mg | Freq: Once | INTRAVENOUS | Status: AC
  Administered 2019-07-21: 1000 mg via INTRAVENOUS
  Filled 2019-07-21: qty 100

## 2019-07-21 NOTE — ED Notes (Signed)
[name] [phone] please update

## 2019-07-21 NOTE — ED Notes (Signed)
When pt returns from MRI, pt to go upstairs.

## 2019-07-21 NOTE — ED Provider Notes (Signed)
Care assumed from Domenic Moras, PA-C, at shift change, please see their notes for full documentation of patient's complaint/HPI. Briefly, pt here with witnessed seizure like activity that lasted approximately 5 minutes PTA. While being evaluated in the ED patient had another seizure that lasted about 1 minute, currently in postictal state. Has already received 2 mg Ativan and Keppra. Per family patient has had multiple similar episodes since January 2020; has never been evaluated for seizures in the past. Results so far show baseline elevation in creatinine at 2.27. Awaiting CT Head. Plan is to consult neurology and admit to medicine.   Physical Exam  BP (!) 162/95 (BP Location: Right Arm)   Pulse 81   Temp 98.5 F (36.9 C) (Oral)   Resp 14   SpO2 94%   Physical Exam  ED Course/Procedures     Procedures  MDM  Head CT without acute findings besides soft tissue swelling above orbit on right side. Will consult neuro at this time as well as medicine for admission.    Discussed case with neuro who will evaluate patient. Able to speak with niece on the phone with neurologist Dr. Cheral Marker; niece states that pt does not drink heavily; less likely to be alcohol withdrawal seizures. She is aware of his cocaine use.   Neuro reccs- MRI brain, EEG, Keppra IV 500 mg BID and change to PO when pt capable of taking PO meds.   Upon reevaluation of patient he is awake and alert. He cannot recall events of today but states he has no complaints at this time.   Discussed case with Dr. Flossie Buffy with hospitalist service; agrees to accept patient for admission.        Eustaquio Maize, PA-C 07/21/19 1853    Wyvonnia Dusky, MD 07/22/19 [phone]

## 2019-07-21 NOTE — ED Notes (Signed)
Tech attempted to get blood x2. Tech was unsuccessful. Nurse notified.

## 2019-07-21 NOTE — H&P (Signed)
History and Physical    Tim Bradley [id number] DOB: Apr 08, 1951 DOA: 07/21/2019  PCP: System, Pcp Not In  Patient coming from: Home  I have personally briefly reviewed patient's old medical records in [place]  Chief Complaint: Witnessed seizure  HPI: Tim Bradley is a 68 y.o. male with medical history significant cocaine abuse, hypertension presents with concerns for witnessed seizure.  Patient unable to provide much history due to somnolent and incoherent speech from missing dentition.  No family at bedside.  Reportedly from ED physician he had a witnessed seizure at home lasting for 5 minutes. Family report that patient had similar episodes since January.  Patient endorsed cocaine use last night but denies alcohol use.  While in the ED, patient had another witnessed seizure activity with reportedly lipsmacking, eyes rolled back and arms tremor followed by postictal state.  He was given 2 mg of Ativan and loading dose and started on Keppra.  Neurology was consulted by ED PA and recommend admission for MRI brain and EEG work-up.    Review of Systems:  Unable to fully obtain due to AMS  Past Medical History:  Diagnosis Date  . Abscess 02/2017   LEFT GLUTEAL   . Acid reflux   . Alcohol abuse   . Cocaine abuse ([place])   . DDD (degenerative disc disease), lumbar   . Diabetes mellitus without complication (Tim Bradley)   . Enlarged prostate   . Hypertension   . Spinal stenosis, lumbar     Past Surgical History:  Procedure Laterality Date  . BACK SURGERY     cervical x2  . BIOPSY  08/21/2018   Procedure: BIOPSY;  Surgeon: Ronald Lobo, MD;  Location: [place];  Service: Endoscopy;;  . ESOPHAGOGASTRODUODENOSCOPY (EGD) WITH PROPOFOL N/A 08/21/2018   Procedure: ESOPHAGOGASTRODUODENOSCOPY (EGD) WITH PROPOFOL;  Surgeon: Ronald Lobo, MD;  Location: [place];  Service: Endoscopy;  Laterality: N/A;  . INCISION AND DRAINAGE PERIRECTAL ABSCESS N/A 03/26/2017   Procedure:  IRRIGATION AND DEBRIDEMENT PERIRECTAL ABSCESS;  Surgeon: Rolm Bookbinder, MD;  Location: [place];  Service: General;  Laterality: N/A;     reports that he has been smoking cigarettes. He has been smoking about 0.25 packs per day. He has never used smokeless tobacco. He reports current alcohol use of about 12.0 standard drinks of alcohol per week. He reports current drug use. Drugs: Cocaine and Marijuana.  No Known Allergies  Family History  Problem Relation Age of Onset  . Hypertension Mother   . Cancer - Lung Father     Family history reviewed and not pertinent   Prior to Admission medications   Medication Sig Start Date End Date Taking? Authorizing Provider  dicyclomine (BENTYL) 20 MG tablet Take 1 tablet (20 mg total) by mouth 3 (three) times daily before meals. Patient not taking: Reported on 09/12/2018 08/17/18   Orpah Greek, MD  docusate sodium (COLACE) 250 MG capsule Take 1 capsule (250 mg total) by mouth daily. 08/13/18   Charlann Lange, PA-C  metFORMIN (GLUCOPHAGE) 500 MG tablet Take 500 mg by mouth 2 (two) times daily with a meal.    [provider]  ondansetron (ZOFRAN ODT) 4 MG disintegrating tablet Take 1 tablet (4 mg total) by mouth every 8 (eight) hours as needed for nausea or vomiting. 09/12/18   Street, [place], PA-C  pantoprazole (PROTONIX) 40 MG tablet Take 1 tablet (40 mg total) by mouth 2 (two) times daily before a meal. 08/22/18   Welford Roche, MD  pantoprazole (  PROTONIX) 40 MG tablet Take 1 tablet (40 mg total) by mouth 2 (two) times daily before a meal. 09/12/18   Street, [place], PA-C  polyethylene glycol (MIRALAX / GLYCOLAX) packet Take 17 g by mouth daily. Take up to 3 times per day until you achieve soft daily bowel movements, then titrate dosing to continue having daily soft bowel movements 09/12/18   Street, [place], PA-C  polyethylene glycol [facility]) packet Use one dose up to three times daily, maximum 3 consecutive days  Patient not taking: Reported on 09/12/2018 08/13/18   Charlann Lange, PA-C  sucralfate (CARAFATE) 1 g tablet Take 1 tablet (1 g total) by mouth 4 (four) times daily. 08/22/18   Santos-Sanchez, Merlene Morse, MD  sucralfate (CARAFATE) 1 GM/10ML suspension Take 10 mLs (1 g total) by mouth 4 (four) times daily -  with meals and at bedtime. 09/12/18   Street, [place], PA-C  tamsulosin (FLOMAX) 0.4 MG CAPS capsule Take 0.4 mg by mouth daily.    [provider]  triamterene-hydrochlorothiazide (MAXZIDE) 75-50 MG per tablet Take 1 tablet by mouth daily.    [provider]    Physical Exam: Vitals:   07/21/19 1815 07/21/19 1830 07/21/19 1845 07/21/19 1900  BP: 134/81 (!) 165/93 135/87 134/82  Pulse:   67 65  Resp: 15 16 15 14   Temp:      TempSrc:      SpO2:   97% 97%    Constitutional: NAD, calm, comfortable, asleep sitting upright in bed. Awoke briefly to noxious stimuli. Able to answer some questions but difficult to understand speech due to missing dentition.  Vitals:   07/21/19 1815 07/21/19 1830 07/21/19 1845 07/21/19 1900  BP: 134/81 (!) 165/93 135/87 134/82  Pulse:   67 65  Resp: 15 16 15 14   Temp:      TempSrc:      SpO2:   97% 97%   Eyes: PERRL, lids and conjunctivae normal ENMT: Mucous membranes are moist. Posterior pharynx clear of any exudate or lesions.No dentition.  Neck: normal, supple, no masses Respiratory: clear to auscultation bilaterally, no wheezing, no crackles. Normal respiratory effort with O2 of 97% on via 2L [place]. No accessory muscle use.  Cardiovascular: Regular rate and rhythm, no murmurs / rubs / gallops. No extremity edema. 2+ pedal pulses.  Abdomen: no tenderness, no masses palpated.  Bowel sounds positive.  Musculoskeletal: no clubbing / cyanosis. No joint deformity upper and lower extremities. Good ROM, no contractures. Normal muscle tone.  Skin: no rashes, lesions, ulcers. No induration Neurologic: CN 2-12 grossly intact. Sensation intact, DTR  normal. Strength 5/5 in all 4. Alert to noxious stimuli but unable to determine orientation due to somnolent. Able to follow simple commands.  Psychiatric: Unable to access due to somnolent.    Labs on Admission: I have personally reviewed following labs and imaging studies  CBC: Recent Labs  Lab 07/21/19 1421  WBC 5.6  NEUTROABS 4.1  HGB 12.5*  HCT 39.3  MCV 93.1  PLT [id number]   Basic Metabolic Panel: Recent Labs  Lab 07/21/19 1421  NA 138  K 3.9  CL 105  CO2 20*  GLUCOSE 121*  BUN 40*  CREATININE 2.27*  CALCIUM 8.8*   GFR: CrCl cannot be calculated (Unknown ideal weight.). Liver Function Tests: No results for input(s): AST, ALT, ALKPHOS, BILITOT, PROT, ALBUMIN in the last 168 hours. No results for input(s): LIPASE, AMYLASE in the last 168 hours. No results for input(s): AMMONIA in the last 168 hours. Coagulation Profile:  No results for input(s): INR, PROTIME in the last 168 hours. Cardiac Enzymes: No results for input(s): CKTOTAL, CKMB, CKMBINDEX, TROPONINI in the last 168 hours. BNP (last 3 results) No results for input(s): PROBNP in the last 8760 hours. HbA1C: No results for input(s): HGBA1C in the last 72 hours. CBG: Recent Labs  Lab 07/21/19 1426  GLUCAP 118*   Lipid Profile: No results for input(s): CHOL, HDL, LDLCALC, TRIG, CHOLHDL, LDLDIRECT in the last 72 hours. Thyroid Function Tests: No results for input(s): TSH, T4TOTAL, FREET4, T3FREE, THYROIDAB in the last 72 hours. Anemia Panel: No results for input(s): VITAMINB12, FOLATE, FERRITIN, TIBC, IRON, RETICCTPCT in the last 72 hours. Urine analysis:    Component Value Date/Time   COLORURINE YELLOW 07/21/2019 1830   APPEARANCEUR CLEAR 07/21/2019 1830   LABSPEC 1.015 07/21/2019 1830   PHURINE 5.0 07/21/2019 1830   GLUCOSEU NEGATIVE 07/21/2019 1830   HGBUR MODERATE (A) 07/21/2019 [place] NEGATIVE 07/21/2019 [place] 07/21/2019 1830   PROTEINUR >=300 (A) 07/21/2019 1830    UROBILINOGEN 0.2 03/11/2012 2012   NITRITE NEGATIVE 07/21/2019 1830   LEUKOCYTESUR NEGATIVE 07/21/2019 1830    Radiological Exams on Admission: Ct Head Wo Contrast  Result Date: 07/21/2019 CLINICAL DATA:  Seizure. EXAM: CT HEAD WITHOUT CONTRAST TECHNIQUE: Contiguous axial images were obtained from the base of the skull through the vertex without intravenous contrast. COMPARISON:  None FINDINGS: Brain: No evidence of acute infarction, hemorrhage, hydrocephalus, extra-axial collection or mass lesion/mass effect. Vascular: No hyperdense vessel or unexpected calcification. Skull: Normal. Negative for fracture or focal lesion. Sinuses/Orbits: No acute finding. Other: Soft tissue swelling over the right frontal region may represent a small hematoma, correlate clinically IMPRESSION: No acute intracranial findings. Small area of soft tissue swelling over right supra orbital region. Electronically Signed   By: Zetta Bills M.D.   On: 07/21/2019 15:07    EKG: Independently reviewed.   Assessment/Plan Seizure Witnessed at home and in ED Unclear tigger but pt endorse cocaine use. Reportedly had similar episodes since January IV Keppar loading dose in ED. Then IV 500mg  BID until able to tolerate PO.  MRI brain when able  EEG  Seizure precautions. Speech eval prior to starting diet Appreciate neurology recs  Hypertension -continue Maxzide and Doxazosin   Cocaine use - UDS pending - reports use last night  Medication reconciliation not completed at the time of this documentation.  All medication deemed necessary at this time was ordered for the time being.  DVT prophylaxis:.Lovenox Code Status:Full Family Communication: Plan discussed with patient at bedside  disposition Plan: Home with at least 2 midnight stays  Consults called: Neurology Admission status: inpatient  Albertha Beattie T Chancy Smigiel DO [facility]   If 7PM-7AM, please contact night-coverage [email or web address] Password [facility]   07/21/2019, 7:58 PM

## 2019-07-21 NOTE — ED Provider Notes (Signed)
[place] EMERGENCY DEPARTMENT Provider Note   CSN: [id number] Arrival date & time: 07/21/19  1401     History   Chief Complaint Chief Complaint  Patient presents with  . Seizures    HPI Tim Bradley is a 68 y.o. male.     The history is provided by the patient. No language interpreter was used.  Seizures    68 year old male brought here via EMS from home for evaluation of seizure activity.  Patient has history of diabetes, spinal stenosis, hypertension.  Patient states he was talking to one of his friends at home when he had an apparent syncopal episode.  He reports open up his eyes and EMS was surrounding him.  He denies any significant pain anywhere denies any headache, neck pain, pain in his chest, trouble breathing, abdominal pain, back pain, dysuria, focal numbness or focal weakness.  He denies any prior history of seizures however per EMS note, patient was having a witnessed seizure by friend lasting for approximately 5 minutes and it was reported that patient has had several similar episodes since the start of this year but has not been seen or evaluated for that.  Patient does admits to drinking alcohol on a regular basis usually 1-2 beer, last drink was last night.  Denies any substance use, denies any medication changes or any recent sickness.  No recent sick contact with anyone with COVID-19.  Patient states he lives at home alone.  Patient denies tongue biting or urinary or bowel incontinence.  Past Medical History:  Diagnosis Date  . Abscess 02/2017   LEFT GLUTEAL   . Acid reflux   . DDD (degenerative disc disease), lumbar   . Diabetes mellitus without complication ([place])   . Enlarged prostate   . Hypertension   . Spinal stenosis, lumbar     Patient Active Problem List   Diagnosis Date Noted  . Constipation 08/22/2018  . Gastritis 08/20/2018  . Heme positive stool   . Acute blood loss anemia secondary to massive gastric ulcer   .  Hyponatremia   . Abscess, gluteal, left 03/26/2017  . Type 2 diabetes mellitus ([place]) 03/26/2017  . Adrenal nodule ([place]) 03/26/2017  . Closed fracture of body of thoracic vertebra ([place]) 03/26/2017  . Spinal stenosis of lumbar region 03/26/2017    Past Surgical History:  Procedure Laterality Date  . BACK SURGERY     cervical x2  . BIOPSY  08/21/2018   Procedure: BIOPSY;  Surgeon: Ronald Lobo, MD;  Location: [place];  Service: Endoscopy;;  . ESOPHAGOGASTRODUODENOSCOPY (EGD) WITH PROPOFOL N/A 08/21/2018   Procedure: ESOPHAGOGASTRODUODENOSCOPY (EGD) WITH PROPOFOL;  Surgeon: Ronald Lobo, MD;  Location: [place];  Service: Endoscopy;  Laterality: N/A;  . INCISION AND DRAINAGE PERIRECTAL ABSCESS N/A 03/26/2017   Procedure: IRRIGATION AND DEBRIDEMENT PERIRECTAL ABSCESS;  Surgeon: Rolm Bookbinder, MD;  Location: [place];  Service: General;  Laterality: N/A;        Home Medications    Prior to Admission medications   Medication Sig Start Date End Date Taking? Authorizing Provider  dicyclomine (BENTYL) 20 MG tablet Take 1 tablet (20 mg total) by mouth 3 (three) times daily before meals. Patient not taking: Reported on 09/12/2018 08/17/18   Orpah Greek, MD  docusate sodium (COLACE) 250 MG capsule Take 1 capsule (250 mg total) by mouth daily. 08/13/18   Charlann Lange, PA-C  metFORMIN (GLUCOPHAGE) 500 MG tablet Take 500 mg by mouth 2 (two) times daily with a meal.  [provider]  ondansetron (ZOFRAN ODT) 4 MG disintegrating tablet Take 1 tablet (4 mg total) by mouth every 8 (eight) hours as needed for nausea or vomiting. 09/12/18   Street, [place], PA-C  pantoprazole (PROTONIX) 40 MG tablet Take 1 tablet (40 mg total) by mouth 2 (two) times daily before a meal. 08/22/18   Santos-Sanchez, Merlene Morse, MD  pantoprazole (PROTONIX) 40 MG tablet Take 1 tablet (40 mg total) by mouth 2 (two) times daily before a meal. 09/12/18   Street, [place], PA-C  polyethylene  glycol (MIRALAX / GLYCOLAX) packet Take 17 g by mouth daily. Take up to 3 times per day until you achieve soft daily bowel movements, then titrate dosing to continue having daily soft bowel movements 09/12/18   Street, [place], PA-C  polyethylene glycol [facility]) packet Use one dose up to three times daily, maximum 3 consecutive days Patient not taking: Reported on 09/12/2018 08/13/18   Charlann Lange, PA-C  sucralfate (CARAFATE) 1 g tablet Take 1 tablet (1 g total) by mouth 4 (four) times daily. 08/22/18   Santos-Sanchez, Merlene Morse, MD  sucralfate (CARAFATE) 1 GM/10ML suspension Take 10 mLs (1 g total) by mouth 4 (four) times daily -  with meals and at bedtime. 09/12/18   Street, [place], PA-C  tamsulosin (FLOMAX) 0.4 MG CAPS capsule Take 0.4 mg by mouth daily.    [provider]  triamterene-hydrochlorothiazide (MAXZIDE) 75-50 MG per tablet Take 1 tablet by mouth daily.    [provider]    Family History Family History  Problem Relation Age of Onset  . Hypertension Mother   . Cancer - Lung Father     Social History Social History   Tobacco Use  . Smoking status: Current Every Day Smoker    Packs/day: 0.25    Types: Cigarettes  . Smokeless tobacco: Never Used  Substance Use Topics  . Alcohol use: Yes    Alcohol/week: 12.0 standard drinks    Types: 12 Cans of beer per week    Comment: i can of beer daily  . Drug use: Yes    Types: Cocaine, Marijuana    Comment: cocaine last use Sunday 05/02/18     Allergies   Patient has no known allergies.   Review of Systems Review of Systems  Neurological: Positive for seizures.  All other systems reviewed and are negative.    Physical Exam Updated Vital Signs BP (!) 162/95 (BP Location: Right Arm)   Pulse 81   Temp 98.5 F (36.9 C) (Oral)   Resp 14   SpO2 94%   Physical Exam Vitals signs and nursing note reviewed.  Constitutional:      General: He is not in acute distress.    Appearance: He is  well-developed.     Comments: Elderly male appears to be in no acute discomfort.  HENT:     Head: Normocephalic and atraumatic.     Comments: No scalp tenderness no facial tenderness. Eyes:     Extraocular Movements: Extraocular movements intact.     Conjunctiva/sclera: Conjunctivae normal.     Pupils: Pupils are equal, round, and reactive to light.  Neck:     Musculoskeletal: Normal range of motion and neck supple. No neck rigidity.  Cardiovascular:     Rate and Rhythm: Normal rate and regular rhythm.     Pulses: Normal pulses.     Heart sounds: Normal heart sounds.  Pulmonary:     Effort: Pulmonary effort is normal.     Breath sounds: Normal  breath sounds.  Abdominal:     Palpations: Abdomen is soft.     Tenderness: There is no abdominal tenderness.  Musculoskeletal:        General: Swelling (Bilateral 1+ pitting edema to lower extremities) present.  Skin:    Findings: No rash.  Neurological:     Mental Status: He is alert and oriented to person, place, and time.  Psychiatric:        Mood and Affect: Mood normal.      ED Treatments / Results  Labs (all labs ordered are listed, but only abnormal results are displayed) Labs Reviewed  BASIC METABOLIC PANEL - Abnormal; Notable for the following components:      Result Value   CO2 20 (*)    Glucose, Bld 121 (*)    BUN 40 (*)    Creatinine, Ser 2.27 (*)    Calcium 8.8 (*)    GFR calc non Af Amer 28 (*)    GFR calc Af Amer 33 (*)    All other components within normal limits  CBC WITH DIFFERENTIAL/PLATELET - Abnormal; Notable for the following components:   Hemoglobin 12.5 (*)    All other components within normal limits  CBG MONITORING, ED - Abnormal; Notable for the following components:   Glucose-Capillary 118 (*)    All other components within normal limits  SARS CORONAVIRUS 2 (TAT 6-24 HRS)  ETHANOL  RAPID URINE DRUG SCREEN, HOSP PERFORMED  URINALYSIS, ROUTINE W REFLEX MICROSCOPIC  TROPONIN I (HIGH SENSITIVITY)     EKG None  ED ECG REPORT   Date: 07/21/2019  Rate: 84  Rhythm: normal sinus rhythm  QRS Axis: left  Intervals: QT prolonged  ST/T Wave abnormalities: nonspecific ST changes  Conduction Disutrbances:left bundle branch block  Narrative Interpretation:   Old EKG Reviewed: unchanged  I have personally reviewed the EKG tracing and agree with the computerized printout as noted.   Radiology No results found.  Procedures Procedures (including critical care time)  Medications Ordered in ED Medications  LORazepam (ATIVAN) injection 2 mg (2 mg Intravenous Given 07/21/19 1425)  levETIRAcetam (KEPPRA) IVPB 1000 mg/100 mL premix (0 mg Intravenous Stopped 07/21/19 1500)     Initial Impression / Assessment and Plan / ED Course  I have reviewed the triage vital signs and the nursing notes.  Pertinent labs & imaging results that were available during my care of the patient were reviewed by me and considered in my medical decision making (see chart for details).        BP (!) 162/95 (BP Location: Right Arm)   Pulse 81   Temp 98.5 F (36.9 C) (Oral)   Resp 14   SpO2 94%    Final Clinical Impressions(s) / ED Diagnoses   Final diagnoses:  Seizure [facility])    ED Discharge Orders    None     2:31 PM Patient brought here for witnessed seizure by family member at home lasting for approximately 5 minutes.  When he arrived and during my interview, patient was alert, oriented able to give history and appears to be in no acute discomfort.  However during the physical examination patient did develop seizure activities which includes lipsmacking, eyes rolled back, arm tremors left greater than right lasting for less than a minute followed by a postictal state.  Per note, patient has had similar symptoms throughout this year without any official diagnosis or evaluation for seizure activities.  He does admits to drinking alcohol on a regular basis usually 1-2  beer every other day last drink  was yesterday therefore I have low suspicion for alcohol withdrawal seizure.  Work-up initiated.  Anticipate admission.  Patient was given Ativan as well as a loading dose of Keppra.  3:07 PM Patient still postictal, mildly hypoxic with O2 sats of 94% on room air, patient placed on 2 L of oxygen with improvement of his O2.  Patient did report alcohol use yesterday as well as cocaine use last night.  Will obtain EKG and troponin.  Patient signed out to [facility], PA-C who will f/u on head CT and labs, and will consult neuro as well as having pt admit to medicine.    Domenic Moras, PA-C 07/21/19 1511    Lucrezia Starch, MD 07/21/19 [id number]

## 2019-07-21 NOTE — Consult Note (Addendum)
Neurology Consultation  Reason for Consult: Seizure  Referring Physician: Dr. Langston Masker  History is obtained from: Chart as patient is extremely lethargic secondary to lorazepam and also postictal  HPI: Tim Bradley is a 68 y.o. male with a past medical history of spinal stenosis, hypertension, diabetes, acid reflux, cocaine abuse and history of alcohol abuse.  Per note patient was talking to one of his friends at home when he had a syncopal episode.  He does not recall the episode; however, when he opened his eyes EMS was surrounding him.  Per EMS, patient had a seizure witnessed by his friend lasting approximately 5 minutes. It was also reported that he has had several similar episodes since the start of this year but has not been seen by a physician for this.  Per notes, he admits to drinking alcohol on a regular basis but it is unclear how much. Per report, he had 1-2 beers last night; however there is no alcohol in his system per blood EtOH level.   Currently patient is laying in bed and responds to noxious stimuli only. When noxious stimuli is given patient immediately becomes aggressive.  Only minimal exam was able to be gathered due to this.    ROS:  Unable to obtain due to altered mental status.   Past Medical History:  Diagnosis Date  . Abscess 02/2017   LEFT GLUTEAL   . Acid reflux   . Alcohol abuse   . Cocaine abuse ([place])   . DDD (degenerative disc disease), lumbar   . Diabetes mellitus without complication ([place])   . Enlarged prostate   . Hypertension   . Spinal stenosis, lumbar      Family History  Problem Relation Age of Onset  . Hypertension Mother   . Cancer - Lung Father    Social History:   reports that he has been smoking cigarettes. He has been smoking about 0.25 packs per day. He has never used smokeless tobacco. He reports current alcohol use of about 12.0 standard drinks of alcohol per week. He reports current drug use. Drugs: Cocaine and  Marijuana.  Medications No current facility-administered medications for this encounter.   Current Outpatient Medications:  .  dicyclomine (BENTYL) 20 MG tablet, Take 1 tablet (20 mg total) by mouth 3 (three) times daily before meals. (Patient not taking: Reported on 09/12/2018), Disp: 21 tablet, Rfl: 0 .  docusate sodium (COLACE) 250 MG capsule, Take 1 capsule (250 mg total) by mouth daily., Disp: 10 capsule, Rfl: 0 .  metFORMIN (GLUCOPHAGE) 500 MG tablet, Take 500 mg by mouth 2 (two) times daily with a meal., Disp: , Rfl:  .  ondansetron (ZOFRAN ODT) 4 MG disintegrating tablet, Take 1 tablet (4 mg total) by mouth every 8 (eight) hours as needed for nausea or vomiting., Disp: 15 tablet, Rfl: 0 .  pantoprazole (PROTONIX) 40 MG tablet, Take 1 tablet (40 mg total) by mouth 2 (two) times daily before a meal., Disp: 60 tablet, Rfl: 1 .  pantoprazole (PROTONIX) 40 MG tablet, Take 1 tablet (40 mg total) by mouth 2 (two) times daily before a meal., Disp: 60 tablet, Rfl: 0 .  polyethylene glycol (MIRALAX / GLYCOLAX) packet, Take 17 g by mouth daily. Take up to 3 times per day until you achieve soft daily bowel movements, then titrate dosing to continue having daily soft bowel movements, Disp: 30 each, Rfl: 0 .  polyethylene glycol (MIRALAX) packet, Use one dose up to three times daily, maximum 3 consecutive  days (Patient not taking: Reported on 09/12/2018), Disp: 9 each, Rfl: 0 .  sucralfate (CARAFATE) 1 g tablet, Take 1 tablet (1 g total) by mouth 4 (four) times daily., Disp: 56 tablet, Rfl: 0 .  sucralfate (CARAFATE) 1 GM/10ML suspension, Take 10 mLs (1 g total) by mouth 4 (four) times daily -  with meals and at bedtime., Disp: 840 mL, Rfl: 0 .  tamsulosin (FLOMAX) 0.4 MG CAPS capsule, Take 0.4 mg by mouth daily., Disp: , Rfl:  .  triamterene-hydrochlorothiazide (MAXZIDE) 75-50 MG per tablet, Take 1 tablet by mouth daily., Disp: , Rfl:    Exam: Current vital signs: BP (!) 162/95 (BP Location: Right  Arm)   Pulse 81   Temp 98.5 F (36.9 C) (Oral)   Resp 14   SpO2 94%  Vital signs in last 24 hours: Temp:  [98.5 F (36.9 C)] 98.5 F (36.9 C) (10/22 1408) Pulse Rate:  [81] 81 (10/22 1408) Resp:  [14] 14 (10/22 1408) BP: (162)/(95) 162/95 (10/22 1408) SpO2:  [94 %-98 %] 94 % (10/22 1408)  Physical Exam  Review of systems was unable to be obtained as patient is extremely lethargic and only awakens to noxious stimuli    Neuro: Mental Status: Patient is in a deep sleep and only responds to noxious stimuli.  He becomes very agitated with any form of testing and attempts to push practitioner away Cranial Nerves: II: Not evaluated as patient was clenching eyes extremely tight III,IV, VI: Was able to obtain dolls response and pupillary response V: Unable to be obtained VII: Facial grimace symmetrical Motor: While aggressively attempting to push practitioner away he showed 5/5 strength throughout Sensory: Responded to noxious stimuli throughout Deep Tendon Reflexes: 2+ and symmetric in the biceps and patellae.  Plantars: Upgoing toes Cerebellar: Unable to be obtained  Labs I have reviewed labs in epic and the results pertinent to this consultation are:   CBC    Component Value Date/Time   WBC 5.6 07/21/2019 1421   RBC 4.22 07/21/2019 1421   HGB 12.5 (L) 07/21/2019 1421   HCT 39.3 07/21/2019 1421   PLT 168 07/21/2019 1421   MCV 93.1 07/21/2019 1421   MCH 29.6 07/21/2019 1421   MCHC 31.8 07/21/2019 1421   RDW 13.1 07/21/2019 1421   LYMPHSABS 0.8 07/21/2019 1421   MONOABS 0.5 07/21/2019 1421   EOSABS 0.1 07/21/2019 1421   BASOSABS 0.0 07/21/2019 1421    CMP     Component Value Date/Time   NA 138 07/21/2019 1421   K 3.9 07/21/2019 1421   CL 105 07/21/2019 1421   CO2 20 (L) 07/21/2019 1421   GLUCOSE 121 (H) 07/21/2019 1421   BUN 40 (H) 07/21/2019 1421   CREATININE 2.27 (H) 07/21/2019 1421   CALCIUM 8.8 (L) 07/21/2019 1421   PROT 6.9 09/12/2018 1401   ALBUMIN  2.8 (L) 09/12/2018 1401   AST 19 09/12/2018 1401   ALT 15 09/12/2018 1401   ALKPHOS 63 09/12/2018 1401   BILITOT 0.4 09/12/2018 1401   GFRNONAA 28 (L) 07/21/2019 1421   GFRAA 33 (L) 07/21/2019 1421    Lipid Panel  No results found for: CHOL, TRIG, HDL, CHOLHDL, VLDL, LDLCALC, LDLDIRECT   Imaging I have reviewed the images obtained:  CT-scan of the brain-no acute findings  Etta Quill PA-C [facility] [phone] 07/21/2019, 5:05 PM    Assessment:  68 year old male presenting to hospital secondary to seizure activity.   1. Etiology for seizures includes metabolic derangement, EtOH withdrawal and  onset of epilepsy within the past year (lesional versus cryptogenic). Cocaine-induced seizure is also possible.  2. CT head was negative  3. Neurological exam was limited due to somnolence/postictal state, but strength was 5/5 in all four extremities and no clinical seizure activity was seen.   Recommendations: 1.  MRI brain without contrast when able 2.  EEG 3.  Load Keppra 1000 mg followed by Keppra IV 500 mg twice daily and change to p.o. when capable of taking p.o. medications 4.  Seizure precautions 5..Per [facility] statutes, patients with seizures are not allowed to drive until  they have been seizure-free for six months. Use caution when using heavy equipment or power tools. Avoid working on ladders or at heights. Take showers instead of baths. Ensure the water temperature is not too high on the home water heater. Do not go swimming alone. When caring for infants or small children, sit down when holding, feeding, or changing them to minimize risk of injury to the child in the event you have a seizure. Also, Maintain good sleep hygiene. Avoid alcohol. 6. CIWA protocol 7. EtOH cessation counseling.   I have seen and examined the patient. I have formulated the assessment and plan. 68 year old male presenting with seizure today. Per report, has had recurrent  seizures since January. Exam limited by postictal state reveals 5/5 strength in all 4 extremities with no clinical seizure activity. Recommendations as above.  Electronically signed: Dr. Kerney Elbe

## 2019-07-21 NOTE — ED Notes (Signed)
Patient transported to MRI 

## 2019-07-21 NOTE — ED Notes (Addendum)
ED TO INPATIENT HANDOFF REPORT  ED Nurse Name and Phone #:  [phone]  S Name/Age/Gender Tim Bradley 68 y.o. male Room/Bed: 032C/032C  Code Status   Code Status: Full Code  Home/SNF/Other Home Patient oriented to: self, place, time and situation Is this baseline? Yes   Triage Complete: Triage complete  Chief Complaint sz  Triage Note Pt BIB [facility] from home. Per EMS pt experienced a witnessed seizure lasting approximately 5 minutes. Per pt family pt has been having similar episodes since January but has not been seen for prior events. VSS. NAD> A&Ox4.   Allergies No Known Allergies  Level of Care/Admitting Diagnosis ED Disposition    ED Disposition Condition [facility] Area: [place] [[id number]]  Level of Care: Telemetry Cardiac [103]  Covid Evaluation: Asymptomatic Screening Protocol (No Symptoms)  Diagnosis: Seizure [facility]) [id number]  Admitting Physician: Orene Desanctis [id number]  Attending Physician: Orene Desanctis [id number]  Estimated length of stay: past midnight tomorrow  Certification:: I certify this patient will need inpatient services for at least 2 midnights  PT Class (Do Not Modify): Inpatient [101]  PT Acc Code (Do Not Modify): Private [1]       B Medical/Surgery History Past Medical History:  Diagnosis Date  . Abscess 02/2017   LEFT GLUTEAL   . Acid reflux   . Alcohol abuse   . Cocaine abuse ([place])   . DDD (degenerative disc disease), lumbar   . Diabetes mellitus without complication ([place])   . Enlarged prostate   . Hypertension   . Spinal stenosis, lumbar    Past Surgical History:  Procedure Laterality Date  . BACK SURGERY     cervical x2  . BIOPSY  08/21/2018   Procedure: BIOPSY;  Surgeon: Ronald Lobo, MD;  Location: [place];  Service: Endoscopy;;  . ESOPHAGOGASTRODUODENOSCOPY (EGD) WITH PROPOFOL N/A 08/21/2018   Procedure: ESOPHAGOGASTRODUODENOSCOPY (EGD) WITH PROPOFOL;  Surgeon: Ronald Lobo,  MD;  Location: [place];  Service: Endoscopy;  Laterality: N/A;  . INCISION AND DRAINAGE PERIRECTAL ABSCESS N/A 03/26/2017   Procedure: IRRIGATION AND DEBRIDEMENT PERIRECTAL ABSCESS;  Surgeon: Rolm Bookbinder, MD;  Location: [place];  Service: General;  Laterality: N/A;     A IV Location/Drains/Wounds Patient Lines/Drains/Airways Status   Active Line/Drains/Airways    Name:   Placement date:   Placement time:   Site:   Days:   Peripheral IV 07/21/19 Right Antecubital   07/21/19    1403    Antecubital   less than 1          Intake/Output Last 24 hours  Intake/Output Summary (Last 24 hours) at 07/21/2019 2138 Last data filed at 07/21/2019 1831 Gross per 24 hour  Intake 100 ml  Output 300 ml  Net -200 ml    Labs/Imaging Results for orders placed or performed during the hospital encounter of 07/21/19 (from the past 48 hour(s))  Basic metabolic panel     Status: Abnormal   Collection Time: 07/21/19  2:21 PM  Result Value Ref Range   Sodium 138 135 - 145 mmol/L   Potassium 3.9 3.5 - 5.1 mmol/L   Chloride 105 98 - 111 mmol/L   CO2 20 (L) 22 - 32 mmol/L   Glucose, Bld 121 (H) 70 - 99 mg/dL   BUN 40 (H) 8 - 23 mg/dL   Creatinine, Ser 2.27 (H) 0.61 - 1.24 mg/dL   Calcium 8.8 (L) 8.9 - 10.3 mg/dL   GFR calc non Af Wyvonnia Lora  28 (L) >60 mL/min   GFR calc Af Amer 33 (L) >60 mL/min   Anion gap 13 5 - 15    Comment: Performed at [facility] [address], [place], [place] [address]  CBC WITH DIFFERENTIAL     Status: Abnormal   Collection Time: 07/21/19  2:21 PM  Result Value Ref Range   WBC 5.6 4.0 - 10.5 K/uL   RBC 4.22 4.22 - 5.81 MIL/uL   Hemoglobin 12.5 (L) 13.0 - 17.0 g/dL   HCT 39.3 39.0 - 52.0 %   MCV 93.1 80.0 - 100.0 fL   MCH 29.6 26.0 - 34.0 pg   MCHC 31.8 30.0 - 36.0 g/dL   RDW 13.1 11.5 - 15.5 %   Platelets 168 150 - 400 K/uL   nRBC 0.0 0.0 - 0.2 %   Neutrophils Relative % 73 %   Neutro Abs 4.1 1.7 - 7.7 K/uL   Lymphocytes Relative 14 %   Lymphs Abs 0.8  0.7 - 4.0 K/uL   Monocytes Relative 9 %   Monocytes Absolute 0.5 0.1 - 1.0 K/uL   Eosinophils Relative 3 %   Eosinophils Absolute 0.1 0.0 - 0.5 K/uL   Basophils Relative 1 %   Basophils Absolute 0.0 0.0 - 0.1 K/uL   Immature Granulocytes 0 %   Abs Immature Granulocytes 0.02 0.00 - 0.07 K/uL    Comment: Performed at [facility] [address] [place], [place] [address]  Ethanol/ETOH     Status: None   Collection Time: 07/21/19  2:21 PM  Result Value Ref Range   Alcohol, Ethyl (B) <10 <10 mg/dL    Comment: (NOTE) Lowest detectable limit for serum alcohol is 10 mg/dL. For medical purposes only. Performed at [facility], [place] [address], [place], [place] [address]   CBG monitoring, ED     Status: Abnormal   Collection Time: 07/21/19  2:26 PM  Result Value Ref Range   Glucose-Capillary 118 (H) 70 - 99 mg/dL  Troponin I (High Sensitivity)     Status: Abnormal   Collection Time: 07/21/19  3:00 PM  Result Value Ref Range   Troponin I (High Sensitivity) 28 (H) <18 ng/L    Comment: (NOTE) Elevated high sensitivity troponin I (hsTnI) values and significant  changes across serial measurements may suggest ACS but many other  chronic and acute conditions are known to elevate hsTnI results.  Refer to the "Links" section for chest pain algorithms and additional  guidance. Performed at [facility], [place] [address] [place], [place] [address]   SARS CORONAVIRUS 2 (TAT 6-24 HRS) Nasopharyngeal Nasopharyngeal Swab     Status: None   Collection Time: 07/21/19  3:10 PM   Specimen: Nasopharyngeal Swab  Result Value Ref Range   SARS Coronavirus 2 NEGATIVE NEGATIVE    Comment: (NOTE) SARS-CoV-2 target nucleic acids are NOT DETECTED. The SARS-CoV-2 RNA is generally detectable in upper and lower respiratory specimens during the acute phase of infection. Negative results do not preclude SARS-CoV-2 infection, do not rule out co-infections with other pathogens, and should not be  used as the sole basis for treatment or other patient management decisions. Negative results must be combined with clinical observations, patient history, and epidemiological information. The expected result is Negative. Fact Sheet for Patients: SugarRoll.be Fact Sheet for Healthcare Providers: https://www.woods-mathews.com/ This test is not yet approved or cleared by the [place] FDA and  has been authorized for detection and/or diagnosis of SARS-CoV-2  by FDA under an Emergency Use Authorization (EUA). This EUA will remain  in effect (meaning this test can be used) for the duration of the COVID-19 declaration under Section 56 4(b)(1) of the Act, 21 U.S.C. section 360bbb-3(b)(1), unless the authorization is terminated or revoked sooner. Performed at [facility], [place] [address], [place], [place] [address]   Troponin I (High Sensitivity)     Status: Abnormal   Collection Time: 07/21/19  5:00 PM  Result Value Ref Range   Troponin I (High Sensitivity) 35 (H) <18 ng/L    Comment: (NOTE) Elevated high sensitivity troponin I (hsTnI) values and significant  changes across serial measurements may suggest ACS but many other  chronic and acute conditions are known to elevate hsTnI results.  Refer to the "Links" section for chest pain algorithms and additional  guidance. Performed at [facility], [place] [address], [place], [place] [address]   Urine rapid drug screen (hosp performed)     Status: Abnormal   Collection Time: 07/21/19  6:30 PM  Result Value Ref Range   Opiates POSITIVE (A) NONE DETECTED   Cocaine POSITIVE (A) NONE DETECTED   Benzodiazepines NONE DETECTED NONE DETECTED   Amphetamines POSITIVE (A) NONE DETECTED   Tetrahydrocannabinol NONE DETECTED NONE DETECTED   Barbiturates NONE DETECTED NONE DETECTED    Comment: (NOTE) DRUG SCREEN FOR MEDICAL PURPOSES ONLY.  IF CONFIRMATION IS NEEDED FOR ANY PURPOSE, NOTIFY  LAB WITHIN 5 DAYS. LOWEST DETECTABLE LIMITS FOR URINE DRUG SCREEN Drug Class                     Cutoff (ng/mL) Amphetamine and metabolites    1000 Barbiturate and metabolites    200 Benzodiazepine                 [id number] Tricyclics and metabolites     300 Opiates and metabolites        300 Cocaine and metabolites        300 THC                            50 Performed at [facility], [place] [address], [place], [place] [address]   Urinalysis, Routine w reflex microscopic     Status: Abnormal   Collection Time: 07/21/19  6:30 PM  Result Value Ref Range   Color, Urine YELLOW YELLOW   APPearance CLEAR CLEAR   Specific Gravity, Urine 1.015 1.005 - 1.030   pH 5.0 5.0 - 8.0   Glucose, UA NEGATIVE NEGATIVE mg/dL   Hgb urine dipstick MODERATE (A) NEGATIVE   Bilirubin Urine NEGATIVE NEGATIVE   Ketones, ur NEGATIVE NEGATIVE mg/dL   Protein, ur >=300 (A) NEGATIVE mg/dL   Nitrite NEGATIVE NEGATIVE   Leukocytes,Ua NEGATIVE NEGATIVE   RBC / HPF 11-20 0 - 5 RBC/hpf   WBC, UA 0-5 0 - 5 WBC/hpf   Bacteria, UA NONE SEEN NONE SEEN   Squamous Epithelial / LPF 0-5 0 - 5    Comment: Performed at [facility], [place] [address] [place], [place] [address]   Ct Head Wo Contrast  Result Date: 07/21/2019 CLINICAL DATA:  Seizure. EXAM: CT HEAD WITHOUT CONTRAST TECHNIQUE: Contiguous axial images were obtained from the base of the skull through the vertex without intravenous contrast. COMPARISON:  None FINDINGS: Brain: No evidence of acute infarction, hemorrhage, hydrocephalus, extra-axial collection or mass lesion/mass effect. Vascular: No hyperdense vessel or  unexpected calcification. Skull: Normal. Negative for fracture or focal lesion. Sinuses/Orbits: No acute finding. Other: Soft tissue swelling over the right frontal region may represent a small hematoma, correlate clinically IMPRESSION: No acute intracranial findings. Small area of soft tissue swelling over right supra orbital region.  Electronically Signed   By: Zetta Bills M.D.   On: 07/21/2019 15:07    Pending Labs Unresulted Labs (From admission, onward)    Start     Ordered   07/22/19 [id number]  Basic metabolic panel  Tomorrow morning,   R     07/21/19 1943   07/22/19 0500  CBC  Tomorrow morning,   R     07/21/19 1943   07/22/19 0500  TSH  Tomorrow morning,   R     07/21/19 1943          Vitals/Pain Today's Vitals   07/21/19 1845 07/21/19 1900 07/21/19 2030 07/21/19 2100  BP: 135/87 134/82  (!) 136/92  Pulse: 67 65  69  Resp: 15 14  17   Temp:      TempSrc:      SpO2: 97% 97%  98%  PainSc:   0-No pain     Isolation Precautions No active isolations  Medications Medications  levETIRAcetam (KEPPRA) IVPB 500 mg/100 mL premix (has no administration in time range)  enoxaparin (LOVENOX) injection 40 mg (has no administration in time range)  LORazepam (ATIVAN) injection 2 mg (2 mg Intravenous Given 07/21/19 1425)  levETIRAcetam (KEPPRA) IVPB 1000 mg/100 mL premix (0 mg Intravenous Stopped 07/21/19 1500)    Mobility walks with device Low fall risk   Focused Assessments Neuro Assessment Handoff:  Cardiac Rhythm: (S) Normal sinus rhythm NIH Stroke Scale ( + Modified Stroke Scale Criteria)  LOC Questions (1b. )   +: (S) Answers both questions correctly LOC Commands (1c. )   + : Performs both tasks correctly Best Gaze (2. )  +: Normal Visual (3. )  +: No visual loss Motor Arm, Left (5a. )   +: No drift Motor Arm, Right (5b. )   +: No drift Motor Leg, Left (6a. )   +: No drift Motor Leg, Right (6b. )   +: No drift Sensory (8. )   +: Normal, no sensory loss Best Language (9. )   +: No aphasia Extinction/Inattention (11.)   +: No Abnormality Modified SS Total  +: 0 Last date known well: 07/21/19   Neuro Assessment: Exceptions to WDL Neuro Checks:      Last Documented NIHSS Modified Score: 0 (07/21/19 2031) Has TPA been given? No If patient is a Neuro Trauma and patient is going to OR before  floor call report to [place] nurse: [phone] or [phone]     R Recommendations: See Admitting Provider Note  Report given to:   Additional Notes: -

## 2019-07-21 NOTE — ED Triage Notes (Signed)
Pt BIB [facility] from home. Per EMS pt experienced a witnessed seizure lasting approximately 5 minutes. Per pt family pt has been having similar episodes since January but has not been seen for prior events. VSS. NAD> A&Ox4.

## 2019-07-22 ENCOUNTER — Inpatient Hospital Stay (HOSPITAL_COMMUNITY): Payer: Medicare Other

## 2019-07-22 DIAGNOSIS — R569 Unspecified convulsions: Secondary | ICD-10-CM

## 2019-07-22 DIAGNOSIS — I10 Essential (primary) hypertension: Secondary | ICD-10-CM

## 2019-07-22 LAB — CBC
HCT: 39.3 % (ref 39.0–52.0)
Hemoglobin: 12.7 g/dL — ABNORMAL LOW (ref 13.0–17.0)
MCH: 30 pg (ref 26.0–34.0)
MCHC: 32.3 g/dL (ref 30.0–36.0)
MCV: 92.9 fL (ref 80.0–100.0)
Platelets: 168 10*3/uL (ref 150–400)
RBC: 4.23 MIL/uL (ref 4.22–5.81)
RDW: 13 % (ref 11.5–15.5)
WBC: 6.2 10*3/uL (ref 4.0–10.5)
nRBC: 0 % (ref 0.0–0.2)

## 2019-07-22 LAB — TSH: TSH: 0.758 u[IU]/mL (ref 0.350–4.500)

## 2019-07-22 LAB — BASIC METABOLIC PANEL
Anion gap: 10 (ref 5–15)
BUN: 38 mg/dL — ABNORMAL HIGH (ref 8–23)
CO2: 25 mmol/L (ref 22–32)
Calcium: 8.7 mg/dL — ABNORMAL LOW (ref 8.9–10.3)
Chloride: 106 mmol/L (ref 98–111)
Creatinine, Ser: 2.26 mg/dL — ABNORMAL HIGH (ref 0.61–1.24)
GFR calc Af Amer: 33 mL/min — ABNORMAL LOW (ref >60)
GFR calc non Af Amer: 28 mL/min — ABNORMAL LOW (ref >60)
Glucose, Bld: 97 mg/dL (ref 70–99)
Potassium: 3.4 mmol/L — ABNORMAL LOW (ref 3.5–5.1)
Sodium: 141 mmol/L (ref 135–145)

## 2019-07-22 LAB — GLUCOSE, CAPILLARY
Glucose-Capillary: 85 mg/dL (ref 70–99)
Glucose-Capillary: 190 mg/dL — ABNORMAL HIGH (ref 70–99)

## 2019-07-22 MED ORDER — INFLUENZA VAC A&B SA ADJ QUAD 0.5 ML IM PRSY
0.5000 mL | PREFILLED_SYRINGE | INTRAMUSCULAR | Status: AC
  Administered 2019-07-23: 0.5 mL via INTRAMUSCULAR
  Filled 2019-07-22: qty 0.5

## 2019-07-22 MED ORDER — DOXAZOSIN MESYLATE 8 MG PO TABS
8.0000 mg | ORAL_TABLET | Freq: Every day | ORAL | Status: DC
  Administered 2019-07-22 – 2019-07-24 (×3): 8 mg via ORAL
  Filled 2019-07-22 (×3): qty 1

## 2019-07-22 MED ORDER — SODIUM CHLORIDE 0.9 % IV SOLN
INTRAVENOUS | Status: DC | PRN
  Administered 2019-07-22 (×2): 250 mL via INTRAVENOUS

## 2019-07-22 MED ORDER — POTASSIUM CHLORIDE 10 MEQ/100ML IV SOLN
10.0000 meq | INTRAVENOUS | Status: AC
  Administered 2019-07-22 (×2): 10 meq via INTRAVENOUS
  Filled 2019-07-22 (×2): qty 100

## 2019-07-22 MED ORDER — TRIAMTERENE-HCTZ 75-50 MG PO TABS
1.0000 | ORAL_TABLET | Freq: Every day | ORAL | Status: DC
  Administered 2019-07-22 – 2019-07-23 (×2): 1 via ORAL
  Filled 2019-07-22 (×2): qty 1

## 2019-07-22 NOTE — Progress Notes (Signed)
EEG complete - results pending 

## 2019-07-22 NOTE — Progress Notes (Signed)
PROGRESS NOTE  Tim Bradley [id number] DOB: Jan 14, 1951 DOA: 07/21/2019 PCP: System, Pcp Not In  HPI/Recap of past 24 hours: Tim Bradley is a 68 y.o. male with medical history significant cocaine abuse, hypertension presents with concerns for witnessed seizure.  Patient unable to provide much history due to somnolent and incoherent speech from missing dentition.  No family at bedside.  Reportedly from ED physician he had a witnessed seizure at home lasting for 5 minutes. Family report that patient had similar episodes since January.  Patient endorsed cocaine use last night but denies alcohol use.  While in the ED, patient had another witnessed seizure activity with reportedly lipsmacking, eyes rolled back and arms tremor followed by postictal state.  He was given 2 mg of Ativan and loading dose and started on Keppra.  Neurology was consulted by ED PA and recommend admission for MRI brain and EEG work-up.   07/22/19: Patient was seen and examined at his bedside this morning.  He is alert and oriented x3.  Admits to recent alcohol and cocaine use prior to admission.  Assessment/Plan: Active Problems:   Seizure ([place])  Witnessed seizure activity likely multifactorial in the setting of amphetamine, cocaine and alcohol use. MRI brain no acute intracranial findings, mild to moderate chronic microvascular ischemic disease. Seen by neurology, following EEG 07/22/2019 no seizure or epileptiform discharges Started on [place] protocol in place Polysubstance/amphetamine/cocaine/alcohol cessation counseling done at bedside Continue seizure precautions IV Ativan for breakthrough seizures  Polysubstance abuse disorder UDS done on 07/21/2019 + for amphetamine, opiates, cocaine Admits to regular use of alcohol  Alcohol abuse with concern for withdrawal Continue CIWA protocol  Elevated troponin, likely demand ischemia in the setting of seizure activity Troponin S peaked at 35 Denies any  anginal symptoms  CKD 3 Appears to be at baseline 2.2 with GFR of 30. Continue to avoid nephrotoxins  monitor urine output  Hypokalemia Potassium 3.4 repleted BMP in the morning  Essential hypertension Blood pressure and heart rate elevated. Continue home antihypertensives Continue to monitor vital signs   DVT prophylaxis:.Lovenox subcu daily Code Status:Full Family Communication: Plan discussed with patient at bedside   Disposition Plan: Patient is currently not appropriate for discharge due to ongoing work-up.  Patient will require at least 2 midnights for further evaluation and treatment of present condition.  Consults called: Neurology    Objective: Vitals:   07/21/19 2319 07/22/19 0332 07/22/19 0818 07/22/19 1154  BP: (!) 160/106 (!) 150/91 (!) 162/102 (!) 109/92  Pulse: 81 (!) 55 73 71  Resp:  17 18 18   Temp: 98.4 F (36.9 C) 98.1 F (36.7 C) 98.5 F (36.9 C) 98.6 F (37 C)  TempSrc: Oral Oral Oral Oral  SpO2: 98% 100% 100% 97%  Weight: 94.6 kg       Intake/Output Summary (Last 24 hours) at 07/22/2019 1407 Last data filed at 07/22/2019 1154 Gross per 24 hour  Intake 460.85 ml  Output 1050 ml  Net -589.15 ml   Filed Weights   07/21/19 2319  Weight: 94.6 kg    Exam:   General: 68 y.o. year-old male well developed well nourished in no acute distress.  Alert and oriented x3.  Cardiovascular: Regular rate and rhythm with no rubs or gallops.  No thyromegaly or JVD noted.    Respiratory: Clear to auscultation with no wheezes or rales. Good inspiratory effort.  Abdomen: Soft nontender nondistended with normal bowel sounds x4 quadrants.  Musculoskeletal: No lower extremity edema. 2/4 pulses in  all 4 extremities.  Psychiatry: Mood is appropriate for condition and setting   Data Reviewed: CBC: Recent Labs  Lab 07/21/19 1421 07/22/19 0332  WBC 5.6 6.2  NEUTROABS 4.1  --   HGB 12.5* 12.7*  HCT 39.3 39.3  MCV 93.1 92.9  PLT 168 [id number]   Basic  Metabolic Panel: Recent Labs  Lab 07/21/19 1421 07/22/19 0332  NA 138 141  K 3.9 3.4*  CL 105 106  CO2 20* 25  GLUCOSE 121* 97  BUN 40* 38*  CREATININE 2.27* 2.26*  CALCIUM 8.8* 8.7*   GFR: CrCl cannot be calculated (Unknown ideal weight.). Liver Function Tests: No results for input(s): AST, ALT, ALKPHOS, BILITOT, PROT, ALBUMIN in the last 168 hours. No results for input(s): LIPASE, AMYLASE in the last 168 hours. No results for input(s): AMMONIA in the last 168 hours. Coagulation Profile: No results for input(s): INR, PROTIME in the last 168 hours. Cardiac Enzymes: No results for input(s): CKTOTAL, CKMB, CKMBINDEX, TROPONINI in the last 168 hours. BNP (last 3 results) No results for input(s): PROBNP in the last 8760 hours. HbA1C: No results for input(s): HGBA1C in the last 72 hours. CBG: Recent Labs  Lab 07/21/19 1426  GLUCAP 118*   Lipid Profile: No results for input(s): CHOL, HDL, LDLCALC, TRIG, CHOLHDL, LDLDIRECT in the last 72 hours. Thyroid Function Tests: Recent Labs    07/22/19 0332  TSH 0.758   Anemia Panel: No results for input(s): VITAMINB12, FOLATE, FERRITIN, TIBC, IRON, RETICCTPCT in the last 72 hours. Urine analysis:    Component Value Date/Time   COLORURINE YELLOW 07/21/2019 1830   APPEARANCEUR CLEAR 07/21/2019 1830   LABSPEC 1.015 07/21/2019 1830   PHURINE 5.0 07/21/2019 1830   GLUCOSEU NEGATIVE 07/21/2019 1830   HGBUR MODERATE (A) 07/21/2019 1830   BILIRUBINUR NEGATIVE 07/21/2019 [place] 07/21/2019 1830   PROTEINUR >=300 (A) 07/21/2019 1830   UROBILINOGEN 0.2 03/11/2012 2012   NITRITE NEGATIVE 07/21/2019 1830   LEUKOCYTESUR NEGATIVE 07/21/2019 1830   Sepsis Labs: @LABRCNTIP (procalcitonin:4,lacticidven:4)  ) Recent Results (from the past 240 hour(s))  SARS CORONAVIRUS 2 (TAT 6-24 HRS) Nasopharyngeal Nasopharyngeal Swab     Status: None   Collection Time: 07/21/19  3:10 PM   Specimen: Nasopharyngeal Swab  Result Value  Ref Range Status   SARS Coronavirus 2 NEGATIVE NEGATIVE Final    Comment: (NOTE) SARS-CoV-2 target nucleic acids are NOT DETECTED. The SARS-CoV-2 RNA is generally detectable in upper and lower respiratory specimens during the acute phase of infection. Negative results do not preclude SARS-CoV-2 infection, do not rule out co-infections with other pathogens, and should not be used as the sole basis for treatment or other patient management decisions. Negative results must be combined with clinical observations, patient history, and epidemiological information. The expected result is Negative. Fact Sheet for Patients: SugarRoll.be Fact Sheet for Healthcare Providers: https://www.woods-mathews.com/ This test is not yet approved or cleared by the [place] FDA and  has been authorized for detection and/or diagnosis of SARS-CoV-2 by FDA under an Emergency Use Authorization (EUA). This EUA will remain  in effect (meaning this test can be used) for the duration of the COVID-19 declaration under Section 56 4(b)(1) of the Act, 21 U.S.C. section 360bbb-3(b)(1), unless the authorization is terminated or revoked sooner. Performed at [facility], [place] [address], [place], [place] [address]       Studies: Ct Head Wo Contrast  Result Date: 07/21/2019 CLINICAL DATA:  Seizure. EXAM: CT HEAD WITHOUT CONTRAST TECHNIQUE: Contiguous  axial images were obtained from the base of the skull through the vertex without intravenous contrast. COMPARISON:  None FINDINGS: Brain: No evidence of acute infarction, hemorrhage, hydrocephalus, extra-axial collection or mass lesion/mass effect. Vascular: No hyperdense vessel or unexpected calcification. Skull: Normal. Negative for fracture or focal lesion. Sinuses/Orbits: No acute finding. Other: Soft tissue swelling over the right frontal region may represent a small hematoma, correlate clinically IMPRESSION: No acute  intracranial findings. Small area of soft tissue swelling over right supra orbital region. Electronically Signed   By: Zetta Bills M.D.   On: 07/21/2019 15:07   Mr Brain Wo Contrast  Result Date: 07/21/2019 CLINICAL DATA:  Initial evaluation for new onset seizure. EXAM: MRI HEAD WITHOUT CONTRAST TECHNIQUE: Multiplanar, multiecho pulse sequences of the brain and surrounding structures were obtained without intravenous contrast. COMPARISON:  Prior head CT from earlier the same day. FINDINGS: Brain: Generalized age-related cerebral atrophy. Patchy and confluent T2/FLAIR hyperintensity involving the periventricular and deep white matter both cerebral hemispheres, most consistent with chronic small vessel ischemic disease, mild to moderate in nature. No abnormal foci of restricted diffusion to suggest acute or subacute ischemia or changes related to seizure. No encephalomalacia to suggest chronic cortical infarction. No foci of susceptibility artifact to suggest acute or chronic intracranial hemorrhage. No mass lesion, midline shift or mass effect. Ventricles normal size without hydrocephalus. No extra-axial fluid collection. Pituitary gland suprasellar region normal. Midline structures intact. No intrinsic temporal lobe abnormality. Vascular: Major intracranial vascular flow voids are maintained. Skull and upper cervical spine: Craniocervical junction within normal limits. Postsurgical changes partially visualize within the upper cervical spine. Bone marrow signal intensity normal. No acute scalp soft tissue abnormality. Sinuses/Orbits: Globes and orbital soft tissues within normal limits. Paranasal sinuses are clear. No mastoid effusion. Inner ear structures grossly normal. Other: None. IMPRESSION: 1. No acute intracranial abnormality. 2. Age-related cerebral atrophy with mild to moderate chronic microvascular ischemic disease. Electronically Signed   By: Jeannine Boga M.D.   On: 07/21/2019 23:16     Scheduled Meds:  doxazosin  8 mg Oral Daily   enoxaparin (LOVENOX) injection  40 mg Subcutaneous Daily   triamterene-hydrochlorothiazide  1 tablet Oral Daily    Continuous Infusions:  sodium chloride Stopped (07/22/19 0601)   levETIRAcetam 500 mg (07/22/19 0917)     LOS: 1 day     Kayleen Memos, MD [facility] Pager [phone]  If 7PM-7AM, please contact night-coverage [email or web address] Password [email or web address] 07/22/2019, 2:07 PM

## 2019-07-22 NOTE — TOC Initial Note (Addendum)
Transition of Care [facility]) - Initial/Assessment Note    Patient Details  Name: Tim Bradley MRN: [id number] Date of Birth: [birth year]  Transition of Care [facility]) CM/SW Contact:    Pollie Friar, RN Phone Number: 07/22/2019, 1:25 PM  Clinical Narrative:                 Pt states he lives home alone but has a male friend that comes and goes. He has no other support at home.  Pt states he doesn't take his medications as prescribed. States "I sometimes forget to take them".  [facility]Dr Roxanna Mew SW at the [place]: Mickel Baas [id number] ext: [phone] Pt drives. If not able to after admission may need some transportation assistance. CM will provide him with resources. TOC following.  Expected Discharge Plan: Home/Self Care Barriers to Discharge: Continued Medical Work up   Patient Goals and CMS Choice        Expected Discharge Plan and Services Expected Discharge Plan: Home/Self Care   Discharge Planning Services: CM Consult                                          Prior Living Arrangements/Services   Lives with:: Self Patient language and need for interpreter reviewed:: Yes(no needs) Do you feel safe going back to the place where you live?: Yes        Care giver support system in place?: No (comment) Current home services: DME(walker and cane) Criminal Activity/Legal Involvement Pertinent to Current Situation/Hospitalization: No - Comment as needed  Activities of Daily Living      Permission Sought/Granted                  Emotional Assessment Appearance:: Appears stated age Attitude/Demeanor/Rapport: Lethargic Affect (typically observed): Accepting Orientation: : Oriented to Self, Oriented to Place, Oriented to  Time, Oriented to Situation   Psych Involvement: No (comment)  Admission diagnosis:  Seizure [facility]) [R56.9] Patient Active Problem List   Diagnosis Date Noted  . Seizure ([employer] [employer]) 07/21/2019  . Cocaine abuse ([place])   .  Constipation 08/22/2018  . Gastritis 08/20/2018  . Heme positive stool   . Acute blood loss anemia secondary to massive gastric ulcer   . Hyponatremia   . Abscess, gluteal, left 03/26/2017  . Type 2 diabetes mellitus ([place]) 03/26/2017  . Adrenal nodule ([place]) 03/26/2017  . Closed fracture of body of thoracic vertebra ([place]) 03/26/2017  . Spinal stenosis of lumbar region 03/26/2017   PCP:  System, Pcp Not In Pharmacy:   [id number] - [place], [place] [id number] [place] [id number] Phone: [phone] Fax: [phone]  [place], [place] - [address] [place] [address] Phone: [phone] Fax: [place], [place]. [place] [address] Phone: [phone] Fax: [phone]     Social Determinants of Health (SDOH) Interventions    Readmission Risk Interventions No flowsheet data found.

## 2019-07-22 NOTE — Procedures (Signed)
Patient Name: Tim Bradley  MRN: [id number]  Epilepsy Attending: Lora Havens  Referring Physician/Provider: Etta Quill, PA Date: 07/22/2019 Duration: 25.59 mins  Patient history: 68yo M with seizure like activity. EEG to evaluate for seizure.  Level of alertness: awake, asleep  AEDs during EEG study: Keppra  Technical aspects: This EEG study was done with scalp electrodes positioned according to the 10-20 International system of electrode placement. Electrical activity was acquired at a sampling rate of 500Hz  and reviewed with a high frequency filter of 70Hz  and a low frequency filter of 1Hz . EEG data were recorded continuously and digitally stored.    DESCRIPTION: During awake state, no clear posterior dominant rhythm was seen. Sleep was characterized by sleep spindles (12 to 14 Hz), maximal frontocentral.  EEG also showed continuous generalized 2 to 5 Hz theta-delta slowing. Hyperventilation and photic stimulation were not performed.  Abnormality  -Continued slow, generalized  IMPRESSION: This study is suggestive of mild to moderate diffuse encephalopathy, nonspecific etiology. No seizures or epileptiform discharges were seen throughout the recording.  Tim Bradley [name]

## 2019-07-22 NOTE — Progress Notes (Addendum)
NEURO HOSPITALIST PROGRESS NOTE   Subjective: Patient awake, keeps eyes closed, in bed, NAD, EEG being placed.  Exam: Vitals:   07/22/19 0332 07/22/19 0818  BP: (!) 150/91 (!) 162/102  Pulse: (!) 55 73  Resp: 17 18  Temp: 98.1 F (36.7 C) 98.5 F (36.9 C)  SpO2: 100% 100%    Physical Exam  HEENT-  Normocephalic, no lesions, without obvious abnormality.  Normal external eye and conjunctiva.   Cardiovascular- S1-S2 audible, pulses palpable throughout   Lungs- no excessive working breathing.  Saturations within normal limits on RA Extremities- Warm, dry and intact Musculoskeletal-no joint tenderness, deformity or swelling Skin-warm and dry, no hyperpigmentation, vitiligo, or suspicious lesions  Neuro:  Mental Status: Alert, oriented name/age/month/year thought content appropriate.  Speech fluent without evidence of aphasia.  Able to follow  commands without difficulty. Cranial Nerves: II:  Visual fields grossly normal. PERRL.  III,IV, VI: ptosis not present, extra-ocular motions intact bilaterally  V,VII: smile symmetric, edentulous. Facial light touch sensation normal bilaterally VIII: hearing intact to voice IX,X: uvula rises symmetrically XI: bilateral shoulder shrug XII: midline tongue extension Motor: Right : Upper extremity   5/5   Left:     Upper extremity   5/5  Lower extremity   5/5    Lower extremity   5/5 Tone and bulk:normal tone throughout; no atrophy noted Sensory:  light touch intact throughout, bilaterally Cerebellar: normal finger-to-nose Gait: deferred    Medications:  Scheduled: . doxazosin  8 mg Oral Daily  . enoxaparin (LOVENOX) injection  40 mg Subcutaneous Daily  . triamterene-hydrochlorothiazide  1 tablet Oral Daily   Continuous: . sodium chloride Stopped (07/22/19 0601)  . levETIRAcetam 500 mg (07/22/19 0917)   [id number] chloride  Pertinent Labs/Diagnostics:   Ct Head Wo Contrast  Result Date:  07/21/2019 CLINICAL DATA:  Seizure. EXAM: CT HEAD WITHOUT CONTRAST TECHNIQUE: Contiguous axial images were obtained from the base of the skull through the vertex without intravenous contrast. COMPARISON:  None FINDINGS: Brain: No evidence of acute infarction, hemorrhage, hydrocephalus, extra-axial collection or mass lesion/mass effect. Vascular: No hyperdense vessel or unexpected calcification. Skull: Normal. Negative for fracture or focal lesion. Sinuses/Orbits: No acute finding. Other: Soft tissue swelling over the right frontal region may represent a small hematoma, correlate clinically IMPRESSION: No acute intracranial findings. Small area of soft tissue swelling over right supra orbital region. Electronically Signed   By: Zetta Bills M.D.   On: 07/21/2019 15:07   Mr Brain Wo Contrast  Result Date: 07/21/2019 CLINICAL DATA:  Initial evaluation for new onset seizure. EXAM: MRI HEAD WITHOUT CONTRAST TECHNIQUE: Multiplanar, multiecho pulse sequences of the brain and surrounding structures were obtained without intravenous contrast. COMPARISON:  Prior head CT from earlier the same day. FINDINGS: Brain: Generalized age-related cerebral atrophy. Patchy and confluent T2/FLAIR hyperintensity involving the periventricular and deep white matter both cerebral hemispheres, most consistent with chronic small vessel ischemic disease, mild to moderate in nature. No abnormal foci of restricted diffusion to suggest acute or subacute ischemia or changes related to seizure. No encephalomalacia to suggest chronic cortical infarction. No foci of susceptibility artifact to suggest acute or chronic intracranial hemorrhage. No mass lesion, midline shift or mass effect. Ventricles normal size without hydrocephalus. No extra-axial fluid collection. Pituitary gland suprasellar region normal. Midline structures intact. No intrinsic temporal lobe abnormality. Vascular: Major intracranial vascular flow voids are maintained. Skull  and upper cervical spine: Craniocervical junction within normal limits. Postsurgical changes partially visualize within the upper cervical spine. Bone marrow signal intensity normal. No acute scalp soft tissue abnormality. Sinuses/Orbits: Globes and orbital soft tissues within normal limits. Paranasal sinuses are clear. No mastoid effusion. Inner ear structures grossly normal. Other: None. IMPRESSION: 1. No acute intracranial abnormality. 2. Age-related cerebral atrophy with mild to moderate chronic microvascular ischemic disease. Electronically Signed   By: Jeannine Boga M.D.   On: 07/21/2019 23:16   Assessment:  68 year old male presenting to hospital secondary to seizure activity.   1. Etiology for seizures includes metabolic derangement, EtOH withdrawal and onset of epilepsy within the past year (lesional versus cryptogenic). Cocaine-induced seizure is also possible as UDS was positive for opiates, amphetamines and cocaine. 2. CT head was negative, MRI brain showed no acute abnormality.  3. No clinical seizure activity was seen or reported from nursing o/n. Patient being connected to EEG.  Recommendations: -- Continue Keppra IV 500 mg twice daily and change to p.o. when capable of taking p.o. medications -- Seizure precautions --.Per [facility] statutes, patients with seizures are not allowed to drive until  they have been seizure-free for six months. Use caution when using heavy equipment or power tools. Avoid working on ladders or at heights. Take showers instead of baths. Ensure the water temperature is not too high on the home water heater. Do not go swimming alone. When caring for infants or small children, sit down when holding, feeding, or changing them to minimize risk of injury to the child in the event you have a seizure. Also, Maintain good sleep hygiene. Avoid alcohol. --.CIWA protocol -- EtOH cessation counseling.   Laurey Morale, MSN, NP-C [facility] [phone]  Addendum: Routine EEG 07/22/2019 This study is suggestive of mild to moderate diffuse encephalopathy, nonspecific etiology. No seizures or epileptiform discharges were seen throughout the recording.  Neurology to sign off at this time. Please call with any further questions.    Electronically signed: Dr. Kerney Elbe 07/22/2019, 9:58 AM

## 2019-07-23 DIAGNOSIS — I10 Essential (primary) hypertension: Secondary | ICD-10-CM | POA: Diagnosis not present

## 2019-07-23 LAB — BASIC METABOLIC PANEL
Anion gap: 9 (ref 5–15)
BUN: 32 mg/dL — ABNORMAL HIGH (ref 8–23)
CO2: 22 mmol/L (ref 22–32)
Calcium: 8.5 mg/dL — ABNORMAL LOW (ref 8.9–10.3)
Chloride: 108 mmol/L (ref 98–111)
Creatinine, Ser: 1.99 mg/dL — ABNORMAL HIGH (ref 0.61–1.24)
GFR calc Af Amer: 38 mL/min — ABNORMAL LOW (ref >60)
GFR calc non Af Amer: 33 mL/min — ABNORMAL LOW (ref >60)
Glucose, Bld: 118 mg/dL — ABNORMAL HIGH (ref 70–99)
Potassium: 3.8 mmol/L (ref 3.5–5.1)
Sodium: 139 mmol/L (ref 135–145)

## 2019-07-23 LAB — GLUCOSE, CAPILLARY
Glucose-Capillary: 100 mg/dL — ABNORMAL HIGH (ref 70–99)
Glucose-Capillary: 130 mg/dL — ABNORMAL HIGH (ref 70–99)
Glucose-Capillary: 113 mg/dL — ABNORMAL HIGH (ref 70–99)
Glucose-Capillary: 183 mg/dL — ABNORMAL HIGH (ref 70–99)

## 2019-07-23 MED ORDER — AMLODIPINE BESYLATE 10 MG PO TABS
10.0000 mg | ORAL_TABLET | Freq: Every day | ORAL | Status: DC
  Administered 2019-07-23 – 2019-07-24 (×2): 10 mg via ORAL
  Filled 2019-07-23 (×2): qty 1

## 2019-07-23 MED ORDER — LORAZEPAM 1 MG PO TABS: 1.0000 mg | ORAL_TABLET | ORAL | Status: DC | PRN

## 2019-07-23 MED ORDER — VITAMIN B-1 100 MG PO TABS
100.0000 mg | ORAL_TABLET | Freq: Every day | ORAL | Status: DC
  Administered 2019-07-23 – 2019-07-24 (×2): 100 mg via ORAL
  Filled 2019-07-23 (×2): qty 1

## 2019-07-23 MED ORDER — FOLIC ACID 1 MG PO TABS
1.0000 mg | ORAL_TABLET | Freq: Every day | ORAL | Status: DC
  Administered 2019-07-23 – 2019-07-24 (×2): 1 mg via ORAL
  Filled 2019-07-23 (×2): qty 1

## 2019-07-23 MED ORDER — LORAZEPAM 2 MG/ML IJ SOLN: 1.0000 mg | INTRAMUSCULAR | Status: DC | PRN

## 2019-07-23 MED ORDER — ADULT MULTIVITAMIN W/MINERALS CH
1.0000 | ORAL_TABLET | Freq: Every day | ORAL | Status: DC
  Administered 2019-07-23 – 2019-07-24 (×2): 1 via ORAL
  Filled 2019-07-23 (×2): qty 1

## 2019-07-23 MED ORDER — LEVETIRACETAM 500 MG PO TABS
500.0000 mg | ORAL_TABLET | Freq: Two times a day (BID) | ORAL | Status: DC
  Administered 2019-07-23 – 2019-07-24 (×3): 500 mg via ORAL
  Filled 2019-07-23 (×3): qty 1

## 2019-07-23 MED ORDER — THIAMINE HCL 100 MG/ML IJ SOLN: 100.0000 mg | Freq: Every day | INTRAMUSCULAR | Status: DC

## 2019-07-23 MED ORDER — GABAPENTIN 100 MG PO CAPS
200.0000 mg | ORAL_CAPSULE | Freq: Two times a day (BID) | ORAL | Status: DC
  Administered 2019-07-23 – 2019-07-24 (×3): 200 mg via ORAL
  Filled 2019-07-23 (×3): qty 2

## 2019-07-23 MED ORDER — TRAZODONE HCL 50 MG PO TABS
50.0000 mg | ORAL_TABLET | Freq: Once | ORAL | Status: AC
  Administered 2019-07-23: 50 mg via ORAL
  Filled 2019-07-23: qty 1

## 2019-07-23 NOTE — Evaluation (Addendum)
Occupational Therapy Evaluation Patient Details Name: Tim Bradley MRN: [id number] DOB: November 27, 1950 Today's Date: 07/23/2019    History of Present Illness 68 yo male presenting with concerns for witnessed seizure activity with reportedly lipsmacking, eyes rolled back and arms tremor followed by postictal state; endorsed cocaine use. PMH including abscess (02/2017), Acid reflux, Alcohol abuse, Cocaine abuse, DDD (degenerative disc disease) at lumbar, Diabetes mellitus, Enlarged prostate, HTN, and Spinal stenosis at lumbar.    Clinical Impression   PTA, pt was living alone and was performing ADLs and IADLs with use of SPC for functional mobility. Pt reporting he has his girlfriend who can assist him at home. Pt currently requiring Mod A for LB ADLs and functional mobility with RW. Pt presenting with poor balance, activity tolerance, and safety awareness throughout impacting his safe performance of ADLs. Pt would benefit from further acute OT to facilitate safe dc. Recommend dc to home with HHOT for further OT to optimize safety, independence with ADLs, and return to PLOF.      Follow Up Recommendations  Home health OT;Supervision/Assistance - 24 hour    Equipment Recommendations  3 in 1 bedside commode    Recommendations for Other Services PT consult     Precautions / Restrictions Precautions Precautions: Fall      Mobility Bed Mobility               General bed mobility comments: Sitting in recliner upon arrival  Transfers Overall transfer level: Needs assistance Equipment used: Rolling walker (2 wheeled) Transfers: Sit to/from Stand Sit to Stand: Min assist;+2 safety/equipment;Mod assist         General transfer comment: Min A for power up into standing and then Mod A for balance in standing. +2 for safety    Balance Overall balance assessment: Needs assistance Sitting-balance support: No upper extremity supported;Feet supported Sitting balance-Leahy Scale:  Good     Standing balance support: Bilateral upper extremity supported;During functional activity Standing balance-Leahy Scale: Poor Standing balance comment: Reliant on physical A  and UE support                           ADL either performed or assessed with clinical judgement   ADL Overall ADL's : Needs assistance/impaired Eating/Feeding: Set up;Sitting   Grooming: Set up;Sitting;Supervision/safety   Upper Body Bathing: Set up;Supervision/ safety;Sitting   Lower Body Bathing: Moderate assistance;+2 for safety/equipment;Sit to/from stand   Upper Body Dressing : Set up;Supervision/safety;Sitting Upper Body Dressing Details (indicate cue type and reason): Pt donning second gown like jacket. Pt requiring increased time to tie the strings presenting with decreased FM coorindation Lower Body Dressing: Moderate assistance;+2 for safety/equipment;Sit to/from stand Lower Body Dressing Details (indicate cue type and reason): Pt donning his socks while seated at recliner with Min Guard A. Pt requiring Mod A for standing balance  Toilet Transfer: Moderate assistance;RW;Ambulation(Simulated to recliner) Toilet Transfer Details (indicate cue type and reason): Mod A for balance and safety in standing. Requiring Min A for power up and then Min-Mod for safe descent         Functional mobility during ADLs: Moderate assistance;+2 for safety/equipment;Rolling walker General ADL Comments: Pt presenting with decreased balance, activity tolerance, and safety awareness     Vision         Perception     Praxis      Pertinent Vitals/Pain Pain Assessment: Faces Faces Pain Scale: Hurts even more Pain Location: Back Pain Descriptors / Indicators: Constant;Discomfort;Grimacing  Pain Intervention(s): Monitored during session;Limited activity within patient's tolerance;Repositioned     Hand Dominance Left   Extremity/Trunk Assessment Upper Extremity Assessment Upper Extremity  Assessment: Generalized weakness(Poor fine motor skills)   Lower Extremity Assessment Lower Extremity Assessment: Defer to PT evaluation   Cervical / Trunk Assessment Cervical / Trunk Assessment: Other exceptions;Kyphotic Cervical / Trunk Exceptions: Reports several back sx and a back injury from [employer] service   Communication Communication Communication: No difficulties   Cognition Arousal/Alertness: Awake/alert Behavior During Therapy: WFL for tasks assessed/performed Overall Cognitive Status: Impaired/Different from baseline Area of Impairment: Safety/judgement                         Safety/Judgement: Decreased awareness of safety;Decreased awareness of deficits     General Comments: Feel pt is close to baseline cognition. Presenting with decreased awareness of physical deficits and safety.   General Comments  VSS. BP 157/93. SpO2 98-94% on RA. HR 113 with activity.    Exercises     Shoulder Instructions      Home Living Family/patient expects to be discharged to:: Private residence Living Arrangements: Alone Available Help at Discharge: Friend(s);Available PRN/intermittently Type of Home: House Home Access: Stairs to enter;Other (comment)([facility] [place] Entrance Stairs-Number of Steps: 2   Home Layout: One level     Bathroom Shower/Tub: Teacher, early years/pre: Standard     Home Equipment: Environmental consultant - 2 wheels;Cane - single point;Shower seat          Prior Functioning/Environment Level of Independence: Independent with assistive device(s)        Comments: Performs ADLs and IADLs. does not drive. Uses SPC for functional mobility        OT Problem List: Decreased strength;Decreased range of motion;Decreased activity tolerance;Impaired balance (sitting and/or standing);Decreased knowledge of use of DME or AE;Decreased knowledge of precautions      OT Treatment/Interventions: Self-care/ADL training;Therapeutic  exercise;Energy conservation;DME and/or AE instruction;Therapeutic activities;Patient/family education    OT Goals(Current goals can be found in the care plan section) Acute Rehab OT Goals Patient Stated Goal: "Go home" OT Goal Formulation: With patient Time For Goal Achievement: 08/06/19 Potential to Achieve Goals: Good  OT Frequency: Min 2X/week   Barriers to D/C:            Co-evaluation PT/OT/SLP Co-Evaluation/Treatment: Yes Reason for Co-Treatment: For patient/therapist safety;To address functional/ADL transfers   OT goals addressed during session: ADL's and self-care      AM-PAC OT "6 Clicks" Daily Activity     Outcome Measure Help from another person eating meals?: None Help from another person taking care of personal grooming?: None Help from another person toileting, which includes using toliet, bedpan, or urinal?: A Little Help from another person bathing (including washing, rinsing, drying)?: A Lot Help from another person to put on and taking off regular upper body clothing?: None Help from another person to put on and taking off regular lower body clothing?: A Lot 6 Click Score: 19   End of Session Equipment Utilized During Treatment: Gait belt;Rolling walker Nurse Communication: Mobility status  Activity Tolerance: Patient tolerated treatment well Patient left: in chair;with call bell/phone within reach;with chair alarm set  OT Visit Diagnosis: Unsteadiness on feet (R26.81);Other abnormalities of gait and mobility (R26.89);Muscle weakness (generalized) (M62.81)                Time: [id number] OT Time Calculation (min): 20 min Charges:  OT General Charges $OT Visit: 1  Visit OT Evaluation $OT Eval Moderate Complexity: 1 Mod  Shaira Sova MSOT, OTR/L Acute Rehab Pager: [phone] Office: [place] 07/23/2019, 1:03 PM

## 2019-07-23 NOTE — Progress Notes (Signed)
PROGRESS NOTE  Tim Bradley [id number] DOB: 1950/11/02 DOA: 07/21/2019 PCP: System, Pcp Not In  HPI/Recap of past 24 hours: Tim Bradley is a 68 y.o. male with medical history significant cocaine abuse, hypertension presents with concerns for witnessed seizure.  Patient unable to provide much history due to somnolent and incoherent speech from missing dentition.  No family at bedside.  Reportedly from ED physician he had a witnessed seizure at home lasting for 5 minutes. Family report that patient had similar episodes since January.  Patient endorsed cocaine use last night but denies alcohol use.  While in the ED, patient had another witnessed seizure activity with reportedly lipsmacking, eyes rolled back and arms tremor followed by postictal state.  He was given 2 mg of Ativan and loading dose and started on Keppra.  Neurology was consulted by ED PA and recommend admission for MRI brain and EEG work-up.   07/23/19: Patient was seen and examined at his bedside this morning.  No acute events overnight.  Reports jitteriness.  Started on CIWA protocol.  Assessment/Plan: Active Problems:   Seizure ([place])  Witnessed seizure activity likely multifactorial in the setting of amphetamine, cocaine and alcohol use. MRI brain no acute intracranial findings, mild to moderate chronic microvascular ischemic disease. Seen by neurology, signed off on 07/22/2019. EEG 07/22/2019 no seizure or epileptiform discharges Started on Keppra, continue p.o. Keppra 500 mg twice daily per neurology's recommendations Start CIWA protocol Start gabapentin for alcohol withdrawal Polysubstance/amphetamine/cocaine/alcohol cessation counseling done at bedside Continue seizure precautions Continue IV Ativan for breakthrough seizures  Polysubstance abuse disorder UDS done on 07/21/2019 + for amphetamine, opiates, cocaine Admits to regular use of alcohol  Alcohol abuse with concern for withdrawal Start CIWA  protocol.  Start gabapentin 200 mg twice daily.  Elevated troponin, likely demand ischemia in the setting of seizure activity Troponin S peaked at 35 Denies any anginal symptoms  AKI on CKD 3 Presented with creatinine of 2.27 with GFR of 33 Baseline creatinine appears to be 1.9 with GFR 38 Creatinine 1.99 on 07/23/2019 with GFR 38 Continue to avoid nephrotoxins like NSAIDs Repeat BMP in the morning  Resolved post repletion, hypokalemia Potassium 3.4 repleted>> 3.8 on 07/23/2019  Essential hypertension Blood pressure and heart rate elevated. Hold off Maxide due to AKI Start amlodipine 10 mg daily.  DVT prophylaxis:.Lovenox subcu daily Code Status:Full Family Communication: None at bedside.   Disposition Plan: Patient is currently not appropriate for discharge due to concern for alcohol withdrawal.     Consults called: Neurology    Objective: Vitals:   07/23/19 0600 07/23/19 0926 07/23/19 1235 07/23/19 1240  BP:   (!) 158/83   Pulse: 81  84   Resp:   (!) 21   Temp:  98.3 F (36.8 C)  98.3 F (36.8 C)  TempSrc:  Oral  Oral  SpO2:   98%   Weight:        Intake/Output Summary (Last 24 hours) at 07/23/2019 1524 Last data filed at 07/23/2019 0600 Gross per 24 hour  Intake 774.75 ml  Output 1650 ml  Net -875.25 ml   Filed Weights   07/21/19 2319  Weight: 94.6 kg    Exam:  . General: 68 y.o. year-old male well-developed well-nourished in no acute distress.  Alert oriented x3.   . Cardiovascular: Regular rate and rhythm no rubs or gallops no JVD or thyromegaly . Respiratory: Clear to auscultation no wheezes or rales.  Poor inspiratory effort.   . Abdomen: Soft nontender  nondistended normal bowel sounds present.   . Musculoskeletal: No lower extremity edema.  2/4 pulses in all 4 extremities.   Psychiatry: Mood is appropriate for condition and setting.   Data Reviewed: CBC: Recent Labs  Lab 07/21/19 1421 07/22/19 0332  WBC 5.6 6.2  NEUTROABS 4.1  --    HGB 12.5* 12.7*  HCT 39.3 39.3  MCV 93.1 92.9  PLT 168 [id number]   Basic Metabolic Panel: Recent Labs  Lab 07/21/19 1421 07/22/19 0332 07/23/19 0640  NA 138 141 139  K 3.9 3.4* 3.8  CL 105 106 108  CO2 20* 25 22  GLUCOSE 121* 97 118*  BUN 40* 38* 32*  CREATININE 2.27* 2.26* 1.99*  CALCIUM 8.8* 8.7* 8.5*   GFR: CrCl cannot be calculated (Unknown ideal weight.). Liver Function Tests: No results for input(s): AST, ALT, ALKPHOS, BILITOT, PROT, ALBUMIN in the last 168 hours. No results for input(s): LIPASE, AMYLASE in the last 168 hours. No results for input(s): AMMONIA in the last 168 hours. Coagulation Profile: No results for input(s): INR, PROTIME in the last 168 hours. Cardiac Enzymes: No results for input(s): CKTOTAL, CKMB, CKMBINDEX, TROPONINI in the last 168 hours. BNP (last 3 results) No results for input(s): PROBNP in the last 8760 hours. HbA1C: No results for input(s): HGBA1C in the last 72 hours. CBG: Recent Labs  Lab 07/21/19 1426 07/22/19 1643 07/22/19 2111 07/23/19 0615 07/23/19 1243  GLUCAP 118* 85 190* 100* 130*   Lipid Profile: No results for input(s): CHOL, HDL, LDLCALC, TRIG, CHOLHDL, LDLDIRECT in the last 72 hours. Thyroid Function Tests: Recent Labs    07/22/19 0332  TSH 0.758   Anemia Panel: No results for input(s): VITAMINB12, FOLATE, FERRITIN, TIBC, IRON, RETICCTPCT in the last 72 hours. Urine analysis:    Component Value Date/Time   COLORURINE YELLOW 07/21/2019 1830   APPEARANCEUR CLEAR 07/21/2019 1830   LABSPEC 1.015 07/21/2019 1830   PHURINE 5.0 07/21/2019 1830   GLUCOSEU NEGATIVE 07/21/2019 1830   HGBUR MODERATE (A) 07/21/2019 1830   BILIRUBINUR NEGATIVE 07/21/2019 [place] 07/21/2019 1830   PROTEINUR >=300 (A) 07/21/2019 1830   UROBILINOGEN 0.2 03/11/2012 2012   NITRITE NEGATIVE 07/21/2019 1830   LEUKOCYTESUR NEGATIVE 07/21/2019 1830   Sepsis Labs: @LABRCNTIP (procalcitonin:4,lacticidven:4)  ) Recent Results  (from the past 240 hour(s))  SARS CORONAVIRUS 2 (TAT 6-24 HRS) Nasopharyngeal Nasopharyngeal Swab     Status: None   Collection Time: 07/21/19  3:10 PM   Specimen: Nasopharyngeal Swab  Result Value Ref Range Status   SARS Coronavirus 2 NEGATIVE NEGATIVE Final    Comment: (NOTE) SARS-CoV-2 target nucleic acids are NOT DETECTED. The SARS-CoV-2 RNA is generally detectable in upper and lower respiratory specimens during the acute phase of infection. Negative results do not preclude SARS-CoV-2 infection, do not rule out co-infections with other pathogens, and should not be used as the sole basis for treatment or other patient management decisions. Negative results must be combined with clinical observations, patient history, and epidemiological information. The expected result is Negative. Fact Sheet for Patients: SugarRoll.be Fact Sheet for Healthcare Providers: https://www.woods-mathews.com/ This test is not yet approved or cleared by the [place] FDA and  has been authorized for detection and/or diagnosis of SARS-CoV-2 by FDA under an Emergency Use Authorization (EUA). This EUA will remain  in effect (meaning this test can be used) for the duration of the COVID-19 declaration under Section 56 4(b)(1) of the Act, 21 U.S.C. section 360bbb-3(b)(1), unless the authorization is terminated or revoked  sooner. Performed at [facility], [place] [address], [place], [place] [address]       Studies: No results found.  Scheduled Meds: . doxazosin  8 mg Oral Daily  . enoxaparin (LOVENOX) injection  40 mg Subcutaneous Daily  . folic acid  1 mg Oral Daily  . levETIRAcetam  500 mg Oral BID  . multivitamin with minerals  1 tablet Oral Daily  . thiamine  100 mg Oral Daily  . triamterene-hydrochlorothiazide  1 tablet Oral Daily    Continuous Infusions: . sodium chloride Stopped (07/23/19 0055)     LOS: 2 days     Kayleen Memos, MD  [facility] Pager [phone]  If 7PM-7AM, please contact night-coverage [email or web address] Password [email or web address] 07/23/2019, 3:24 PM

## 2019-07-23 NOTE — Evaluation (Signed)
Physical Therapy Evaluation Patient Details Name: Tim Bradley MRN: [id number] DOB: 09/25/1951 Today's Date: 07/23/2019   History of Present Illness  68 yo male presenting with concerns for witnessed seizure activity with reportedly lipsmacking, eyes rolled back and arms tremor followed by postictal state; endorsed cocaine use. PMH including abscess (02/2017), Acid reflux, Alcohol abuse, Cocaine abuse, DDD (degenerative disc disease) at lumbar, Diabetes mellitus, Enlarged prostate, HTN, and Spinal stenosis at lumbar.  Clinical Impression  Pt admitted with above diagnosis. Pt reports he has friends that can stay with him 24/t but given his drug abuse history, question the benefit of these friends. Pt presents with balance impairment, needing mod A with use of cane. With RW, was able to ambulate with min A for safety. Decreased safety awareness noted.  Pt currently with functional limitations due to the deficits listed below (see PT Problem List). Pt will benefit from skilled PT to increase their independence and safety with mobility to allow discharge to the venue listed below.       Follow Up Recommendations Home health PT;Supervision for mobility/OOB    Equipment Recommendations  None recommended by PT    Recommendations for Other Services       Precautions / Restrictions Precautions Precautions: Fall Restrictions Weight Bearing Restrictions: No      Mobility  Bed Mobility               General bed mobility comments: Sitting in recliner upon arrival  Transfers Overall transfer level: Needs assistance Equipment used: Rolling walker (2 wheeled);Straight cane Transfers: Sit to/from Stand Sit to Stand: Min assist;+2 safety/equipment;Mod assist         General transfer comment: Min A for power up into standing and then Mod A for balance in standing. +2 for safety  Ambulation/Gait Ambulation/Gait assistance: Mod assist;+2 physical assistance;+2 safety/equipment Gait  Distance (Feet): 100 Feet Assistive device: Rolling walker (2 wheeled);Straight cane Gait Pattern/deviations: Narrow base of support;Staggering right;Staggering left Gait velocity: decreased Gait velocity interpretation: <1.31 ft/sec, indicative of household ambulator General Gait Details: began with SPC and pt with LOB out of BOS with mod A to correct. Improved stability with RW but still needed min A +2 for support and safety, increasing B knee flexion with distance. vc's for staying close to RW.   Stairs            Wheelchair Mobility    Modified Rankin (Stroke Patients Only)       Balance Overall balance assessment: Needs assistance Sitting-balance support: No upper extremity supported;Feet supported Sitting balance-Leahy Scale: Good     Standing balance support: Bilateral upper extremity supported;During functional activity Standing balance-Leahy Scale: Poor Standing balance comment: Reliant on physical A  and UE support                             Pertinent Vitals/Pain Pain Assessment: Faces Faces Pain Scale: Hurts even more Pain Location: Back Pain Descriptors / Indicators: Constant;Discomfort;Grimacing Pain Intervention(s): Monitored during session;Repositioned;Limited activity within patient's tolerance    Home Living Family/patient expects to be discharged to:: Private residence Living Arrangements: Alone Available Help at Discharge: Friend(s);Available PRN/intermittently Type of Home: House Home Access: Stairs to enter;Other (comment)([facility] [place] Entrance Stairs-Rails: None Entrance Stairs-Number of Steps: 2 Home Layout: One level Home Equipment: Walker - 2 wheels;Cane - single point;Shower seat      Prior Function Level of Independence: Independent with assistive device(s)  Comments: Performs ADLs and IADLs. does not drive. Uses SPC for functional mobility     Hand Dominance   Dominant Hand: Left     Extremity/Trunk Assessment   Upper Extremity Assessment Upper Extremity Assessment: Defer to OT evaluation    Lower Extremity Assessment Lower Extremity Assessment: Generalized weakness(poor coordination noted)    Cervical / Trunk Assessment Cervical / Trunk Assessment: Other exceptions;Kyphotic Cervical / Trunk Exceptions: Reports several back sx and a back injury from [employer] service  Communication   Communication: No difficulties  Cognition Arousal/Alertness: Awake/alert Behavior During Therapy: WFL for tasks assessed/performed Overall Cognitive Status: Impaired/Different from baseline Area of Impairment: Safety/judgement                         Safety/Judgement: Decreased awareness of safety;Decreased awareness of deficits     General Comments: Feel pt is close to baseline cognition. Presenting with decreased awareness of physical deficits and safety.      General Comments General comments (skin integrity, edema, etc.): VSS    Exercises     Assessment/Plan    PT Assessment Patient needs continued PT services  PT Problem List Decreased strength;Decreased balance;Decreased activity tolerance;Decreased mobility;Decreased coordination;Decreased knowledge of use of DME;Decreased knowledge of precautions       PT Treatment Interventions DME instruction;Gait training;Stair training;Functional mobility training;Therapeutic activities;Therapeutic exercise;Balance training;Neuromuscular re-education;Patient/family education;Cognitive remediation    PT Goals (Current goals can be found in the Care Plan section)  Acute Rehab PT Goals Patient Stated Goal: "Go home" PT Goal Formulation: With patient Time For Goal Achievement: 08/06/19 Potential to Achieve Goals: Good    Frequency Min 3X/week   Barriers to discharge Decreased caregiver support pt reports he has friends that can stay with him but question the reliability of this    Co-evaluation PT/OT/SLP  Co-Evaluation/Treatment: Yes Reason for Co-Treatment: For patient/therapist safety;Necessary to address cognition/behavior during functional activity PT goals addressed during session: Mobility/safety with mobility;Balance;Proper use of DME OT goals addressed during session: ADL's and self-care       AM-PAC PT "6 Clicks" Mobility  Outcome Measure Help needed turning from your back to your side while in a flat bed without using bedrails?: None Help needed moving from lying on your back to sitting on the side of a flat bed without using bedrails?: None Help needed moving to and from a bed to a chair (including a wheelchair)?: A Little Help needed standing up from a chair using your arms (e.g., wheelchair or bedside chair)?: A Little Help needed to walk in hospital room?: A Lot Help needed climbing 3-5 steps with a railing? : A Lot 6 Click Score: 18    End of Session Equipment Utilized During Treatment: Gait belt Activity Tolerance: Patient tolerated treatment well Patient left: in chair;with call bell/phone within reach;with chair alarm set Nurse Communication: Mobility status PT Visit Diagnosis: Unsteadiness on feet (R26.81);Muscle weakness (generalized) (M62.81);Difficulty in walking, not elsewhere classified (R26.2)    Time: [id number] PT Time Calculation (min) (ACUTE ONLY): 17 min   Charges:   PT Evaluation $PT Eval Moderate Complexity: [place]  Pager [phone] Office [place] 07/23/2019, 2:56 PM

## 2019-07-24 DIAGNOSIS — E119 Type 2 diabetes mellitus without complications: Secondary | ICD-10-CM

## 2019-07-24 DIAGNOSIS — F1410 Cocaine abuse, uncomplicated: Secondary | ICD-10-CM | POA: Diagnosis not present

## 2019-07-24 LAB — BASIC METABOLIC PANEL
Anion gap: 10 (ref 5–15)
BUN: 27 mg/dL — ABNORMAL HIGH (ref 8–23)
CO2: 22 mmol/L (ref 22–32)
Calcium: 8.3 mg/dL — ABNORMAL LOW (ref 8.9–10.3)
Chloride: 106 mmol/L (ref 98–111)
Creatinine, Ser: 1.94 mg/dL — ABNORMAL HIGH (ref 0.61–1.24)
GFR calc Af Amer: 39 mL/min — ABNORMAL LOW (ref >60)
GFR calc non Af Amer: 34 mL/min — ABNORMAL LOW (ref >60)
Glucose, Bld: 109 mg/dL — ABNORMAL HIGH (ref 70–99)
Potassium: 3.9 mmol/L (ref 3.5–5.1)
Sodium: 138 mmol/L (ref 135–145)

## 2019-07-24 LAB — GLUCOSE, CAPILLARY: Glucose-Capillary: 129 mg/dL — ABNORMAL HIGH (ref 70–99)

## 2019-07-24 MED ORDER — LEVETIRACETAM 500 MG PO TABS: 500.0000 mg | ORAL_TABLET | Freq: Two times a day (BID) | ORAL | 0 refills | Status: DC

## 2019-07-24 NOTE — Discharge Summary (Signed)
Physician Discharge Summary  Patient ID: Tim Bradley MRN: [id number] DOB/AGE: 1951/05/28 68 y.o.  Admit date: 07/21/2019 Discharge date: 07/24/2019  Admission Diagnoses:  Discharge Diagnoses:  Active Problems:   Type 2 diabetes mellitus ([place])   Seizure ([place])   Cocaine abuse [facility])   Discharged Condition: good  Hospital Course: This is a 68 year old male with a history of diabetes, hypertension, polysubstance abuse.  Patient had a witnessed seizure at home and was admitted.  He was loaded with Keppra and continued on Keppra throughout the hospitalization.  Neurology consulted and found no evidence of seizure activity on EEG and a negative MRI.  They recommended continuing the [place].  Patient had no further seizure activity in the hospital.  The patient was counseled that [place] law requires no driving until the patient has been seizure-free for at least 6 months.  Consults: neurology  Significant Diagnostic Studies: EEG negative.  MRI negative  Treatments: IV Keppra  Discharge Exam: Blood pressure (!) 146/84, pulse 80, temperature 99.4 F (37.4 C), temperature source Oral, resp. rate 18, weight 94.6 kg, SpO2 97 %. General appearance: alert, cooperative and no distress Resp: clear to auscultation bilaterally Cardio: regular rate and rhythm, S1, S2 normal, no murmur, click, rub or gallop GI: soft, non-tender; bowel sounds normal; no masses,  no organomegaly Extremities: extremities normal, atraumatic, no cyanosis or edema Skin: Skin color, texture, turgor normal. No rashes or lesions  Disposition: Discharge disposition: 01-Home or Self Care       Discharge Instructions    Call MD for:  difficulty breathing, headache or visual disturbances   Complete by: As directed    Call MD for:  hives   Complete by: As directed    Call MD for:  persistant nausea and vomiting   Complete by: As directed    Call MD for:  redness, tenderness, or signs of infection (pain, swelling,  redness, odor or green/yellow discharge around incision site)   Complete by: As directed    Call MD for:  severe uncontrolled pain   Complete by: As directed    Call MD for:  temperature >100.4   Complete by: As directed    Diet - low sodium heart healthy   Complete by: As directed    Increase activity slowly   Complete by: As directed      Allergies as of 07/24/2019   No Known Allergies     Medication List    TAKE these medications   diclofenac sodium 1 % Gel Commonly known as: VOLTAREN Apply 2 g topically 2 (two) times daily as needed (to affected areas- for pain).   dicyclomine 20 MG tablet Commonly known as: BENTYL Take 1 tablet (20 mg total) by mouth 3 (three) times daily before meals. What changed:   when to take this  reasons to take this   docusate sodium 250 MG capsule Commonly known as: COLACE Take 1 capsule (250 mg total) by mouth daily.   levETIRAcetam 500 MG tablet Commonly known as: KEPPRA Take 1 tablet (500 mg total) by mouth 2 (two) times daily.   metFORMIN 500 MG tablet Commonly known as: GLUCOPHAGE Take 500 mg by mouth 2 (two) times daily with a meal.   pantoprazole 40 MG tablet Commonly known as: PROTONIX Take 1 tablet (40 mg total) by mouth 2 (two) times daily before a meal.   polyethylene glycol 17 g packet Commonly known as: MiraLax Use one dose up to three times daily, maximum 3 consecutive days What  changed:   how much to take  how to take this  when to take this  additional instructions   sucralfate 1 g tablet Commonly known as: CARAFATE Take 1 tablet (1 g total) by mouth 4 (four) times daily.   tamsulosin 0.4 MG Caps capsule Commonly known as: FLOMAX Take 0.4 mg by mouth daily.   triamterene-hydrochlorothiazide 75-50 MG tablet Commonly known as: MAXZIDE Take 1 tablet by mouth daily.            Durable Medical Equipment  (From admission, onward)         Start     Ordered   07/23/19 1525  For home use only DME  3 n 1  Once     07/23/19 1524         [place]. Schedule an appointment as soon as possible for a visit in 2 week(s).   Specialty: General Practice Contact information: [place] [phone]          37 minutes spent in discharging this patient  Signed: Truett Mainland 07/24/2019, 10:20 AM

## 2019-07-24 NOTE — Discharge Instructions (Signed)
No Driving until you have been seizure free for at least 6 months. This is in accordance to [place] state law.  Seizure, Adult A seizure is a sudden burst of abnormal electrical activity in the brain. Seizures usually last from 30 seconds to 2 minutes. They can cause many different symptoms. Usually, seizures are not harmful unless they last a long time. What are the causes? Common causes of this condition include:  Fever or infection.  Conditions that affect the brain, such as: ? A brain abnormality that you were born with. ? A brain or head injury. ? Bleeding in the brain. ? A tumor. ? Stroke. ? Brain disorders such as autism or cerebral palsy.  Low blood sugar.  Conditions that are passed from parent to child (are inherited).  Problems with substances, such as: ? Having a reaction to a drug or a medicine. ? Suddenly stopping the use of a substance (withdrawal). In some cases, the cause may not be known. A person who has repeated seizures over time without a clear cause has a condition called epilepsy. What increases the risk? You are more likely to get this condition if you have:  A family history of epilepsy.  Had a seizure in the past.  A brain disorder.  A history of head injury, lack of oxygen at birth, or strokes. What are the signs or symptoms? There are many types of seizures. The symptoms vary depending on the type of seizure you have. Examples of symptoms during a seizure include:  Shaking (convulsions).  Stiffness in the body.  Passing out (losing consciousness).  Head nodding.  Staring.  Not responding to sound or touch.  Loss of bladder control and bowel control. Some people have symptoms right before and right after a seizure happens. Symptoms before a seizure may include:  Fear.  Worry (anxiety).  Feeling like you may vomit (nauseous).  Feeling like the room is spinning (vertigo).  Feeling like you saw or heard something before (dj  vu).  Odd tastes or smells.  Changes in how you see. You may see flashing lights or spots. Symptoms after a seizure happens can include:  Confusion.  Sleepiness.  Headache.  Weakness on one side of the body. How is this treated? Most seizures will stop on their own in under 5 minutes. In these cases, no treatment is needed. Seizures that last longer than 5 minutes will usually need treatment. Treatment can include:  Medicines given through an IV tube.  Avoiding things that are known to cause your seizures. These can include medicines that you take for another condition.  Medicines to treat epilepsy.  Surgery to stop the seizures. This may be needed if medicines do not help. Follow these instructions at home: Medicines  Take over-the-counter and prescription medicines only as told by your doctor.  Do not eat or drink anything that may keep your medicine from working, such as alcohol. Activity  Do not do any activities that would be dangerous if you had another seizure, like driving or swimming. Wait until your doctor says it is safe for you to do them.  If you live in the [place] ask your local DMV (department of motor vehicles) when you can drive.  Get plenty of rest. Teaching others Teach friends and family what to do when you have a seizure. They should:  Lay you on the ground.  Protect your head and body.  Loosen any tight clothing around your neck.  Turn you on your side.  Not hold you down.  Not put anything into your mouth.  Know whether or not you need emergency care.  Stay with you until you are better.  General instructions  Contact your doctor each time you have a seizure.  Avoid anything that gives you seizures.  Keep a seizure diary. Write down: ? What you think caused each seizure. ? What you remember about each seizure.  Keep all follow-up visits as told by your doctor. This is important. Contact a doctor if:  You have another  seizure.  You have seizures more often.  There is any change in what happens during your seizures.  You keep having seizures with treatment.  You have symptoms of being sick or having an infection. Get help right away if:  You have a seizure that: ? Lasts longer than 5 minutes. ? Is different than seizures you had before. ? Makes it harder to breathe. ? Happens after you hurt your head.  You have any of these symptoms after a seizure: ? Not being able to speak. ? Not being able to use a part of your body. ? Confusion. ? A bad headache.  You have two or more seizures in a row.  You do not wake up right after a seizure.  You get hurt during a seizure. These symptoms may be an emergency. Do not wait to see if the symptoms will go away. Get medical help right away. Call your local emergency services (911 in the [place] Do not drive yourself to the hospital. Summary  Seizures usually last from 30 seconds to 2 minutes. Usually, they are not harmful unless they last a long time.  Do not eat or drink anything that may keep your medicine from working, such as alcohol.  Teach friends and family what to do when you have a seizure.  Contact your doctor each time you have a seizure. This information is not intended to replace advice given to you by your health care provider. Make sure you discuss any questions you have with your health care provider. Document Released: 03/03/2008 Document Revised: 12/03/2018 Document Reviewed: 12/03/2018 Elsevier Patient Education  [place].

## 2019-07-24 NOTE — TOC Transition Note (Signed)
Transition of Care [name]) - CM/SW Discharge Note   Patient Details  Name: Tim Bradley MRN: [id number] Date of Birth: [birth year]  [facility]) CM/SW Contact:  Carles Collet, RN Phone Number: 07/24/2019, 11:10 AM   Clinical Narrative:    Patient to DC to home, will have friend provide transport.  [place] set up w [place]. Declined 3/1. No other CM needs    Final next level of care: Home w Home Health Services Barriers to Discharge: No Barriers Identified   Patient Goals and CMS Choice Patient states their goals for this hospitalization and ongoing recovery are:: to go home CMS Medicare.gov Compare Post Acute Care list provided to:: Patient Choice offered to / list presented to : Patient  Discharge Placement                       Discharge Plan and Services   Discharge Planning Services: CM Consult                      HH Arranged: PT [facility] Agency: [place] Date [place]: 07/24/19 Time [place]: 1109 Representative spoke with at [place]: [place] ([place]) Interventions     Readmission Risk Interventions Readmission Risk Prevention Plan 07/24/2019  Transportation Screening Complete  PCP or Specialist Appt within 3-5 Days Complete  HRI or [place] Complete  Social Work Consult for [place] Planning/Counseling Complete  Palliative Care Screening Not Applicable  Medication Review Press photographer) Complete  Some recent data might be hidden

## 2019-07-24 NOTE — Progress Notes (Signed)
Patient discharged home with home health. Friend called for transport home. All discharge paperwork went over with patient, all questions and concerns addressed. IV taken out. All belongings sent with patient. [place]

## 2019-07-27 LAB — GLUCOSE, CAPILLARY
Glucose-Capillary: 92 mg/dL (ref 70–99)
Glucose-Capillary: 95 mg/dL (ref 70–99)

## 2019-08-25 ENCOUNTER — Other Ambulatory Visit: Payer: Self-pay

## 2019-08-25 ENCOUNTER — Emergency Department (HOSPITAL_COMMUNITY)
Admission: EM | Admit: 2019-08-25 | Discharge: 2019-08-25 | Disposition: A | Discharge status: Home / Self Care | Payer: No Typology Code available for payment source | Attending: Emergency Medicine | Admitting: Emergency Medicine

## 2019-08-25 DIAGNOSIS — Z79899 Other long term (current) drug therapy: Secondary | ICD-10-CM | POA: Diagnosis not present

## 2019-08-25 DIAGNOSIS — I10 Essential (primary) hypertension: Secondary | ICD-10-CM | POA: Insufficient documentation

## 2019-08-25 DIAGNOSIS — Z7984 Long term (current) use of oral hypoglycemic drugs: Secondary | ICD-10-CM | POA: Insufficient documentation

## 2019-08-25 DIAGNOSIS — E119 Type 2 diabetes mellitus without complications: Secondary | ICD-10-CM | POA: Insufficient documentation

## 2019-08-25 DIAGNOSIS — F17210 Nicotine dependence, cigarettes, uncomplicated: Secondary | ICD-10-CM | POA: Insufficient documentation

## 2019-08-25 DIAGNOSIS — R569 Unspecified convulsions: Secondary | ICD-10-CM | POA: Insufficient documentation

## 2019-08-25 LAB — HEPATIC FUNCTION PANEL
ALT: 15 U/L (ref 0–44)
AST: 19 U/L (ref 15–41)
Albumin: 3 g/dL — ABNORMAL LOW (ref 3.5–5.0)
Alkaline Phosphatase: 48 U/L (ref 38–126)
Bilirubin, Direct: <0.1 mg/dL (ref 0.0–0.2)
Total Bilirubin: 0.2 mg/dL — ABNORMAL LOW (ref 0.3–1.2)
Total Protein: 6.4 g/dL — ABNORMAL LOW (ref 6.5–8.1)

## 2019-08-25 LAB — CBC
HCT: 43.4 % (ref 39.0–52.0)
Hemoglobin: 13.7 g/dL (ref 13.0–17.0)
MCH: 29.4 pg (ref 26.0–34.0)
MCHC: 31.6 g/dL (ref 30.0–36.0)
MCV: 93.1 fL (ref 80.0–100.0)
Platelets: 252 10*3/uL (ref 150–400)
RBC: 4.66 MIL/uL (ref 4.22–5.81)
RDW: 12.9 % (ref 11.5–15.5)
WBC: 5.8 10*3/uL (ref 4.0–10.5)
nRBC: 0 % (ref 0.0–0.2)

## 2019-08-25 LAB — BASIC METABOLIC PANEL
Anion gap: 7 (ref 5–15)
BUN: 28 mg/dL — ABNORMAL HIGH (ref 8–23)
CO2: 26 mmol/L (ref 22–32)
Calcium: 9.2 mg/dL (ref 8.9–10.3)
Chloride: 106 mmol/L (ref 98–111)
Creatinine, Ser: 2.19 mg/dL — ABNORMAL HIGH (ref 0.61–1.24)
GFR calc Af Amer: 34 mL/min — ABNORMAL LOW (ref >60)
GFR calc non Af Amer: 29 mL/min — ABNORMAL LOW (ref >60)
Glucose, Bld: 108 mg/dL — ABNORMAL HIGH (ref 70–99)
Potassium: 4 mmol/L (ref 3.5–5.1)
Sodium: 139 mmol/L (ref 135–145)

## 2019-08-25 LAB — ETHANOL: Alcohol, Ethyl (B): <10 mg/dL (ref <10)

## 2019-08-25 MED ORDER — LEVETIRACETAM 500 MG PO TABS
500.0000 mg | ORAL_TABLET | Freq: Once | ORAL | Status: AC
  Administered 2019-08-25: 500 mg via ORAL
  Filled 2019-08-25: qty 1

## 2019-08-25 NOTE — ED Triage Notes (Signed)
Pt arrives POV with friend who dropped him off before triage, states "I guess I blacked out or something." Pt is unsure of how he got here, states he lives by himself and remembers laying in the bed this morning and the next thing he remembers, he was here in a wheelchair in the ER. When asked if he had a seizure, pt sts "that's what they say" and when asked if he's had seizures before says "they say so." Pt A/O x 4 in triage.

## 2019-08-25 NOTE — ED Notes (Signed)
Spoke with pt's friend who drove pt to ED Coral Else"); Coral Else states that pt's "mouth and arms were shaking" and pt wasn't responding to questions for 2-3 minutes

## 2019-08-25 NOTE — ED Provider Notes (Signed)
[facility] EMERGENCY DEPARTMENT Provider Note   CSN: [id number] Arrival date & time: 08/25/19  1231     History   Chief Complaint No chief complaint on file.   HPI Tim Bradley is a 68 y.o. male.     HPI Tim Bradley is a 68 y.o. male with history of alcohol abuse, polysubstance abuse, diabetes, hypertension, "seizures" presents to emergency department after episode of possible seizure.  Patient was a passenger in a car with a friend who is driving.  I did speak on the phone with this friend who said that all of a sudden patient stopped responding and started moving his mouth side to side and jerking his arms.  This lasted about 2 to 3 minutes.  Afterwards patient was confused.  Patient states he does not remember what happened and the first thing he remembers is being rolled into the room here in the emergency department.  Patient denies any recent drug use but admits to drinking alcohol since last night, states "I been sipping on the 40 since yesterday."  He states that he was recently admitted to the hospital for possible seizures.  He was started on Keppra, but he has not taken any since his discharge.  He states "I have a whole bottle of it at home."  During his hospitalization, his EEG was negative but he was recommended to continue to take [place].  Patient is currently at baseline and feels back to his normal self.  He denies any complaints.  Past Medical History:  Diagnosis Date  . Abscess 02/2017   LEFT GLUTEAL   . Acid reflux   . Alcohol abuse   . Cocaine abuse ([name])   . DDD (degenerative disc disease), lumbar   . Diabetes mellitus without complication ([place])   . Enlarged prostate   . Hypertension   . Spinal stenosis, lumbar     Patient Active Problem List   Diagnosis Date Noted  . Seizure ([place]) 07/21/2019  . Cocaine abuse ([place])   . Constipation 08/22/2018  . Gastritis 08/20/2018  . Heme positive stool   . Acute blood loss anemia secondary to  massive gastric ulcer   . Hyponatremia   . Abscess, gluteal, left 03/26/2017  . Type 2 diabetes mellitus ([place]) 03/26/2017  . Adrenal nodule ([place]) 03/26/2017  . Closed fracture of body of thoracic vertebra ([place]) 03/26/2017  . Spinal stenosis of lumbar region 03/26/2017    Past Surgical History:  Procedure Laterality Date  . BACK SURGERY     cervical x2  . BIOPSY  08/21/2018   Procedure: BIOPSY;  Surgeon: Ronald Lobo, MD;  Location: [facility];  Service: Endoscopy;;  . ESOPHAGOGASTRODUODENOSCOPY (EGD) WITH PROPOFOL N/A 08/21/2018   Procedure: ESOPHAGOGASTRODUODENOSCOPY (EGD) WITH PROPOFOL;  Surgeon: Ronald Lobo, MD;  Location: [facility];  Service: Endoscopy;  Laterality: N/A;  . INCISION AND DRAINAGE PERIRECTAL ABSCESS N/A 03/26/2017   Procedure: IRRIGATION AND DEBRIDEMENT PERIRECTAL ABSCESS;  Surgeon: Rolm Bookbinder, MD;  Location: [facility];  Service: General;  Laterality: N/A;        Home Medications    Prior to Admission medications   Medication Sig Start Date End Date Taking? Authorizing Provider  diclofenac sodium (VOLTAREN) 1 % GEL Apply 2 g topically 2 (two) times daily as needed (to affected areas- for pain).    [provider]  dicyclomine (BENTYL) 20 MG tablet Take 1 tablet (20 mg total) by mouth 3 (three) times daily before meals. Patient taking differently: Take 20  mg by mouth 3 (three) times daily as needed for spasms.  08/17/18   Orpah Greek, MD  docusate sodium (COLACE) 250 MG capsule Take 1 capsule (250 mg total) by mouth daily. 08/13/18   Charlann Lange, PA-C  levETIRAcetam (KEPPRA) 500 MG tablet Take 1 tablet (500 mg total) by mouth 2 (two) times daily. 07/24/19   Truett Mainland, DO  metFORMIN (GLUCOPHAGE) 500 MG tablet Take 500 mg by mouth 2 (two) times daily with a meal.    [provider]  pantoprazole (PROTONIX) 40 MG tablet Take 1 tablet (40 mg total) by mouth 2 (two) times daily before a meal. 09/12/18   Street,  [place], PA-C  polyethylene glycol [facility]) packet Use one dose up to three times daily, maximum 3 consecutive days Patient taking differently: Take 17 g by mouth daily.  08/13/18   Charlann Lange, PA-C  sucralfate (CARAFATE) 1 g tablet Take 1 tablet (1 g total) by mouth 4 (four) times daily. 08/22/18   Santos-Sanchez, Merlene Morse, MD  tamsulosin (FLOMAX) 0.4 MG CAPS capsule Take 0.4 mg by mouth daily.    [provider]  triamterene-hydrochlorothiazide (MAXZIDE) 75-50 MG per tablet Take 1 tablet by mouth daily.    [provider]    Family History Family History  Problem Relation Age of Onset  . Hypertension Mother   . Cancer - Lung Father     Social History Social History   Tobacco Use  . Smoking status: Current Every Day Smoker    Packs/day: 0.25    Types: Cigarettes  . Smokeless tobacco: Never Used  Substance Use Topics  . Alcohol use: Yes    Alcohol/week: 12.0 standard drinks    Types: 12 Cans of beer per week    Comment: i can of beer daily  . Drug use: Yes    Types: Cocaine, Marijuana    Comment: cocaine last use Wednesday 07/20/2019     Allergies   Patient has no known allergies.   Review of Systems Review of Systems  Constitutional: Negative for chills and fever.  Respiratory: Negative for cough, chest tightness and shortness of breath.   Cardiovascular: Negative for chest pain, palpitations and leg swelling.  Gastrointestinal: Negative for abdominal distention, abdominal pain, diarrhea, nausea and vomiting.  Genitourinary: Negative for dysuria, frequency, hematuria and urgency.  Musculoskeletal: Negative for arthralgias, myalgias, neck pain and neck stiffness.  Skin: Negative for rash.  Allergic/Immunologic: Negative for immunocompromised state.  Neurological: Positive for seizures. Negative for dizziness, speech difficulty, weakness, light-headedness, numbness and headaches.  All other systems reviewed and are negative.    Physical Exam  Updated Vital Signs BP (!) 149/85   Pulse 93   Temp 98.4 F (36.9 C) (Oral)   Resp (!) 23   SpO2 93%   Physical Exam Vitals signs and nursing note reviewed.  Constitutional:      General: He is not in acute distress.    Appearance: He is well-developed.  HENT:     Head: Normocephalic and atraumatic.  Eyes:     Conjunctiva/sclera: Conjunctivae normal.     Pupils: Pupils are equal, round, and reactive to light.  Neck:     Musculoskeletal: Normal range of motion and neck supple.     Comments: No meningismus Cardiovascular:     Rate and Rhythm: Normal rate and regular rhythm.     Heart sounds: Normal heart sounds.  Pulmonary:     Effort: Pulmonary effort is normal. No respiratory distress.  Breath sounds: Normal breath sounds.  Skin:    General: Skin is warm and dry.     Findings: No rash.  Neurological:     General: No focal deficit present.     Mental Status: He is alert and oriented to person, place, and time. Mental status is at baseline.     Cranial Nerves: No cranial nerve deficit.     Sensory: No sensory deficit.     Motor: No abnormal muscle tone.     Coordination: Coordination normal.     Comments: 5/5 and equal upper and lower extremity strength bilaterally. Equal grip strength bilaterally. Normal finger to nose and heel to shin. No pronator drift.   Psychiatric:        Behavior: Behavior normal.      ED Treatments / Results  Labs (all labs ordered are listed, but only abnormal results are displayed) Labs Reviewed  BASIC METABOLIC PANEL - Abnormal; Notable for the following components:      Result Value   Glucose, Bld 108 (*)    BUN 28 (*)    Creatinine, Ser 2.19 (*)    GFR calc non Af Amer 29 (*)    GFR calc Af Amer 34 (*)    All other components within normal limits  HEPATIC FUNCTION PANEL - Abnormal; Notable for the following components:   Total Protein 6.4 (*)    Albumin 3.0 (*)    Total Bilirubin 0.2 (*)    All other components within normal  limits  CBC  ETHANOL  CBG MONITORING, ED    EKG EKG Interpretation  Date/Time:  Thursday August 25 2019 14:02:49 EST Ventricular Rate:  92 PR Interval:    QRS Duration: 157 QT Interval:  428 QTC Calculation: 530 R Axis:   -58 Text Interpretation: Sinus rhythm Biatrial enlargement Left bundle branch block Abnormal ECG Confirmed by Carmin Muskrat [phone]) on 08/25/2019 2:04:32 PM   Radiology No results found.  Procedures Procedures (including critical care time)  Medications Ordered in ED Medications  levETIRAcetam (KEPPRA) tablet 500 mg (500 mg Oral Given 08/25/19 1426)     Initial Impression / Assessment and Plan / ED Course  I have reviewed the triage vital signs and the nursing notes.  Pertinent labs & imaging results that were available during my care of the patient were reviewed by me and considered in my medical decision making (see chart for details).        Patient seen and examined.  Here after episode of possible seizure.  Patient post ictal after.  Currently at baseline.  No neurological deficits.  He denies any drug use to me but has polysubstance abuse history including cocaine.  Patient also has history of alcohol abuse and admits drinking since last night.  Will check labs, electrolytes, drug screen, alcohol level, monitor.  I will give him a dose of Keppra 500 mg that he is supposed to be taking.  3:26 PM Labs show elevated BUN/Creatinine, hx of the same.  Otherwise unremarkable labs. He received a dose of keppra in ED. patient has been monitored, no more seizure-like activity.  He continues to be alert and oriented and having no complaints.  I think he is stable for discharge home at this time.  I advised him to stop drinking alcohol, stop using drugs, drink plenty of fluids instead.  Make sure to start taking his Keppra regularly as prescribed.  Follow-up with his doctor.  Vitals:   08/25/19 1236 08/25/19 1412 08/25/19 1415 08/25/19  1445  BP: (!)  149/100  (!) 149/85 (!) 136/92  Pulse: 97 95 93 84  Resp: 20 (!) 24 (!) 23 18  Temp: 98.4 F (36.9 C)     TempSrc: Oral     SpO2: 98% 100% 93% 94%     Final Clinical Impressions(s) / ED Diagnoses   Final diagnoses:  Seizure-like activity [facility])    ED Discharge Orders    None       Jeannett Senior, PA-C 08/25/19 1530    Carmin Muskrat, MD 08/26/19 [phone]

## 2019-08-25 NOTE — ED Notes (Signed)
Pt refusing IV at this time; Lahoma Rocker, PA aware

## 2019-08-25 NOTE — ED Notes (Signed)
Patient verbalizes understanding of discharge instructions. Opportunity for questioning and answers were provided. Pt discharged from ED. 

## 2019-08-25 NOTE — Discharge Instructions (Addendum)
Please take Keppra as prescribed by your doctor.  It is imperative that you take it so that you do not have any more seizures.  Stop drinking alcohol.  Do not do use drugs.  Follow-up with your doctor as needed.

## 2020-01-27 ENCOUNTER — Emergency Department (HOSPITAL_COMMUNITY)
Admission: EM | Admit: 2020-01-27 | Discharge: 2020-01-27 | Disposition: A | Discharge status: Home / Self Care | Payer: No Typology Code available for payment source | Attending: Emergency Medicine | Admitting: Emergency Medicine

## 2020-01-27 ENCOUNTER — Encounter (HOSPITAL_COMMUNITY): Payer: Self-pay | Admitting: Emergency Medicine

## 2020-01-27 ENCOUNTER — Other Ambulatory Visit: Payer: Self-pay

## 2020-01-27 DIAGNOSIS — R339 Retention of urine, unspecified: Secondary | ICD-10-CM | POA: Diagnosis not present

## 2020-01-27 DIAGNOSIS — N400 Enlarged prostate without lower urinary tract symptoms: Secondary | ICD-10-CM | POA: Insufficient documentation

## 2020-01-27 DIAGNOSIS — Z7984 Long term (current) use of oral hypoglycemic drugs: Secondary | ICD-10-CM | POA: Insufficient documentation

## 2020-01-27 DIAGNOSIS — E119 Type 2 diabetes mellitus without complications: Secondary | ICD-10-CM | POA: Insufficient documentation

## 2020-01-27 DIAGNOSIS — F17210 Nicotine dependence, cigarettes, uncomplicated: Secondary | ICD-10-CM | POA: Insufficient documentation

## 2020-01-27 MED ORDER — TAMSULOSIN HCL 0.4 MG PO CAPS: 0.4000 mg | ORAL_CAPSULE | Freq: Every day | ORAL | 0 refills | Status: DC

## 2020-01-27 MED ORDER — TAMSULOSIN HCL 0.4 MG PO CAPS
0.4000 mg | ORAL_CAPSULE | Freq: Once | ORAL | Status: AC
  Administered 2020-01-27: 09:00:00 0.4 mg via ORAL
  Filled 2020-01-27: qty 1

## 2020-01-27 NOTE — ED Provider Notes (Signed)
[facility] EMERGENCY DEPARTMENT Provider Note   CSN: [id number] Arrival date & time: 01/27/20  6378     History Chief Complaint  Patient presents with  . Urinary Retention    Tim Bradley is a 69 y.o. male.  69 yo M with a chief complaints of decreased urinary stream and difficulty completing his urinary effort.  This is been going on for at least a few years.  He is seen at the [facility] for this.  Typically takes Flomax.  Has run out of his medicine over the past week or so.  Feels that it is gotten worse.  Has a significant spasm about mid stream where he stops urinating.  He denies fevers denies flank pain.  He feels that if he got his medicine he would be better.  Typically gets it through the mail in order did about 10 days ago but has not received it.  The history is provided by the patient.  Illness Severity:  Moderate Onset quality:  Gradual Duration:  48 months Timing:  Constant Progression:  Worsening Chronicity:  New Associated symptoms: no abdominal pain, no chest pain, no congestion, no diarrhea, no fever, no headaches, no myalgias, no rash, no shortness of breath and no vomiting        Past Medical History:  Diagnosis Date  . Abscess 02/2017   LEFT GLUTEAL   . Acid reflux   . Alcohol abuse   . Cocaine abuse ([place])   . DDD (degenerative disc disease), lumbar   . Diabetes mellitus without complication ([place])   . Enlarged prostate   . Hypertension   . Spinal stenosis, lumbar     Patient Active Problem List   Diagnosis Date Noted  . Seizure ([place]) 07/21/2019  . Cocaine abuse ([place])   . Constipation 08/22/2018  . Gastritis 08/20/2018  . Heme positive stool   . Acute blood loss anemia secondary to massive gastric ulcer   . Hyponatremia   . Abscess, gluteal, left 03/26/2017  . Type 2 diabetes mellitus ([place]) 03/26/2017  . Adrenal nodule ([place]) 03/26/2017  . Closed fracture of body of thoracic vertebra ([place]) 03/26/2017  . Spinal stenosis of  lumbar region 03/26/2017    Past Surgical History:  Procedure Laterality Date  . BACK SURGERY     cervical x2  . BIOPSY  08/21/2018   Procedure: BIOPSY;  Surgeon: Ronald Lobo, MD;  Location: [place];  Service: Endoscopy;;  . ESOPHAGOGASTRODUODENOSCOPY (EGD) WITH PROPOFOL N/A 08/21/2018   Procedure: ESOPHAGOGASTRODUODENOSCOPY (EGD) WITH PROPOFOL;  Surgeon: Ronald Lobo, MD;  Location: [place];  Service: Endoscopy;  Laterality: N/A;  . INCISION AND DRAINAGE PERIRECTAL ABSCESS N/A 03/26/2017   Procedure: IRRIGATION AND DEBRIDEMENT PERIRECTAL ABSCESS;  Surgeon: Rolm Bookbinder, MD;  Location: [place];  Service: General;  Laterality: N/A;       Family History  Problem Relation Age of Onset  . Hypertension Mother   . Cancer - Lung Father     Social History   Tobacco Use  . Smoking status: Current Every Day Smoker    Packs/day: 0.25    Types: Cigarettes  . Smokeless tobacco: Never Used  Substance Use Topics  . Alcohol use: Yes    Alcohol/week: 12.0 standard drinks    Types: 12 Cans of beer per week    Comment: i can of beer daily  . Drug use: Yes    Types: Cocaine, Marijuana    Comment: cocaine last use Wednesday 07/20/2019    Home Medications  Prior to Admission medications   Medication Sig Start Date End Date Taking? Authorizing Provider  diclofenac sodium (VOLTAREN) 1 % GEL Apply 2 g topically 2 (two) times daily as needed (to affected areas- for pain).    [provider]  dicyclomine (BENTYL) 20 MG tablet Take 1 tablet (20 mg total) by mouth 3 (three) times daily before meals. Patient taking differently: Take 20 mg by mouth 3 (three) times daily as needed for spasms.  08/17/18   Orpah Greek, MD  docusate sodium (COLACE) 250 MG capsule Take 1 capsule (250 mg total) by mouth daily. 08/13/18   Charlann Lange, PA-C  levETIRAcetam (KEPPRA) 500 MG tablet Take 1 tablet (500 mg total) by mouth 2 (two) times daily. 07/24/19   Truett Mainland, DO   metFORMIN (GLUCOPHAGE) 500 MG tablet Take 500 mg by mouth 2 (two) times daily with a meal.    [provider]  pantoprazole (PROTONIX) 40 MG tablet Take 1 tablet (40 mg total) by mouth 2 (two) times daily before a meal. 09/12/18   Street, [place], PA-C  polyethylene glycol [facility]) packet Use one dose up to three times daily, maximum 3 consecutive days Patient taking differently: Take 17 g by mouth daily.  08/13/18   Charlann Lange, PA-C  sucralfate (CARAFATE) 1 g tablet Take 1 tablet (1 g total) by mouth 4 (four) times daily. 08/22/18   Santos-Sanchez, Merlene Morse, MD  tamsulosin (FLOMAX) 0.4 MG CAPS capsule Take 0.4 mg by mouth daily.    [provider]  tamsulosin (FLOMAX) 0.4 MG CAPS capsule Take 1 capsule (0.4 mg total) by mouth daily after supper. 01/27/20   Deno Etienne, DO  triamterene-hydrochlorothiazide (MAXZIDE) 75-50 MG per tablet Take 1 tablet by mouth daily.    [provider]    Allergies    Patient has no known allergies.  Review of Systems   Review of Systems  Constitutional: Negative for chills and fever.  HENT: Negative for congestion and facial swelling.   Eyes: Negative for discharge and visual disturbance.  Respiratory: Negative for shortness of breath.   Cardiovascular: Negative for chest pain and palpitations.  Gastrointestinal: Negative for abdominal pain, diarrhea and vomiting.  Genitourinary: Positive for difficulty urinating and frequency. Negative for discharge, penile pain and testicular pain.  Musculoskeletal: Negative for arthralgias and myalgias.  Skin: Negative for color change and rash.  Neurological: Negative for tremors, syncope and headaches.  Psychiatric/Behavioral: Negative for confusion and dysphoric mood.    Physical Exam Updated Vital Signs BP (!) 141/92 (BP Location: Right Arm)   Pulse 82   Temp 98.6 F (37 C) (Oral)   Resp 17   Ht 6\' 2"  (1.88 m)   Wt 94.3 kg   SpO2 98%   BMI 26.71 kg/m   Physical Exam Vitals  and nursing note reviewed.  Constitutional:      Appearance: He is well-developed.  HENT:     Head: Normocephalic and atraumatic.  Eyes:     Pupils: Pupils are equal, round, and reactive to light.  Neck:     Vascular: No JVD.  Cardiovascular:     Rate and Rhythm: Normal rate and regular rhythm.     Heart sounds: No murmur. No friction rub. No gallop.   Pulmonary:     Effort: No respiratory distress.     Breath sounds: No wheezing.  Abdominal:     General: There is no distension.     Tenderness: There is no abdominal tenderness. There is no guarding  or rebound.  Musculoskeletal:        General: Normal range of motion.     Cervical back: Normal range of motion and neck supple.  Skin:    Coloration: Skin is not pale.     Findings: No rash.  Neurological:     Mental Status: He is alert and oriented to person, place, and time.  Psychiatric:        Behavior: Behavior normal.     ED Results / Procedures / Treatments   Labs (all labs ordered are listed, but only abnormal results are displayed) Labs Reviewed  URINALYSIS, ROUTINE W REFLEX MICROSCOPIC    EKG None  Radiology No results found.  Procedures Procedures (including critical care time)  Medications Ordered in ED Medications  tamsulosin (FLOMAX) capsule 0.4 mg (has no administration in time range)    ED Course  I have reviewed the triage vital signs and the nursing notes.  Pertinent labs & imaging results that were available during my care of the patient were reviewed by me and considered in my medical decision making (see chart for details).    MDM Rules/Calculators/A&P                      69 yo M with a chief complaints of urinary retention.  Patient is able to start his stream but about halfway from when he would expect to be done he feels like it suddenly stops and he has a severe pain in his bladder.  Typically takes a medicine for this but is run out.  I discussed multiple treatment options with him,  he has 300+ cc of urine in his bladder I offered to place a Foley catheter which she declined.  Offered to send urine which he also is declining.  Will give him a dose of his Flomax here.  Represcribe him paper copy so he can pick it up at a local pharmacy.  Given urology follow-up through our system if you prefer over the [place].  8:47 AM:  I have discussed the diagnosis/risks/treatment options with the patient and believe the pt to be eligible for discharge home to follow-up with Urology. We also discussed returning to the ED immediately if new or worsening sx occur. We discussed the sx which are most concerning (e.g., sudden worsening pain, fever, flank pain) that necessitate immediate return. Medications administered to the patient during their visit and any new prescriptions provided to the patient are listed below.  Medications given during this visit Medications  tamsulosin (FLOMAX) capsule 0.4 mg (has no administration in time range)     The patient appears reasonably screen and/or stabilized for discharge and I doubt any other medical condition or other [facility] requiring further screening, evaluation, or treatment in the ED at this time prior to discharge.   Final Clinical Impression(s) / ED Diagnoses Final diagnoses:  Urinary retention    Rx / DC Orders ED Discharge Orders         Ordered    tamsulosin (FLOMAX) 0.4 MG CAPS capsule  Daily after supper     01/27/20 [place], [place], DO 01/27/20 [id number]

## 2020-01-27 NOTE — ED Triage Notes (Signed)
Pt reports urinary retention x1-2 weeks after running out of his medication (thinks the med if flomax). States he is able to urinate but has difficulty getting urine stream started at times and also endorses discomfort when this occurs.

## 2020-01-27 NOTE — Discharge Instructions (Signed)
Please follow-up with the [facility] or with the urology group.  Please return for an inability to urinate if you start having fever or flank pain.  Or if you would like to have your urine tested.  Take your medications as prescribed.

## 2020-03-19 ENCOUNTER — Emergency Department (HOSPITAL_COMMUNITY)
Admission: EM | Admit: 2020-03-19 | Discharge: 2020-03-19 | Disposition: A | Discharge status: Home / Self Care | Payer: Non-veteran care | Attending: Emergency Medicine | Admitting: Emergency Medicine

## 2020-03-19 ENCOUNTER — Encounter (HOSPITAL_COMMUNITY): Payer: Self-pay | Admitting: Emergency Medicine

## 2020-03-19 DIAGNOSIS — I10 Essential (primary) hypertension: Secondary | ICD-10-CM | POA: Insufficient documentation

## 2020-03-19 DIAGNOSIS — Z7984 Long term (current) use of oral hypoglycemic drugs: Secondary | ICD-10-CM | POA: Diagnosis not present

## 2020-03-19 DIAGNOSIS — R569 Unspecified convulsions: Secondary | ICD-10-CM | POA: Insufficient documentation

## 2020-03-19 DIAGNOSIS — F17210 Nicotine dependence, cigarettes, uncomplicated: Secondary | ICD-10-CM | POA: Insufficient documentation

## 2020-03-19 DIAGNOSIS — E119 Type 2 diabetes mellitus without complications: Secondary | ICD-10-CM | POA: Diagnosis not present

## 2020-03-19 LAB — COMPREHENSIVE METABOLIC PANEL
ALT: 19 U/L (ref 0–44)
AST: 22 U/L (ref 15–41)
Albumin: 3.3 g/dL — ABNORMAL LOW (ref 3.5–5.0)
Alkaline Phosphatase: 50 U/L (ref 38–126)
Anion gap: 9 (ref 5–15)
BUN: 38 mg/dL — ABNORMAL HIGH (ref 8–23)
CO2: 23 mmol/L (ref 22–32)
Calcium: 8.5 mg/dL — ABNORMAL LOW (ref 8.9–10.3)
Chloride: 108 mmol/L (ref 98–111)
Creatinine, Ser: 2.41 mg/dL — ABNORMAL HIGH (ref 0.61–1.24)
GFR calc Af Amer: 30 mL/min — ABNORMAL LOW (ref >60)
GFR calc non Af Amer: 26 mL/min — ABNORMAL LOW (ref >60)
Glucose, Bld: 147 mg/dL — ABNORMAL HIGH (ref 70–99)
Potassium: 4.4 mmol/L (ref 3.5–5.1)
Sodium: 140 mmol/L (ref 135–145)
Total Bilirubin: 0.8 mg/dL (ref 0.3–1.2)
Total Protein: 6.4 g/dL — ABNORMAL LOW (ref 6.5–8.1)

## 2020-03-19 LAB — CBC WITH DIFFERENTIAL/PLATELET
Abs Immature Granulocytes: 0.02 10*3/uL (ref 0.00–0.07)
Basophils Absolute: 0 10*3/uL (ref 0.0–0.1)
Basophils Relative: 1 %
Eosinophils Absolute: 0.1 10*3/uL (ref 0.0–0.5)
Eosinophils Relative: 1 %
HCT: 41.2 % (ref 39.0–52.0)
Hemoglobin: 13.3 g/dL (ref 13.0–17.0)
Immature Granulocytes: 0 %
Lymphocytes Relative: 14 %
Lymphs Abs: 1 10*3/uL (ref 0.7–4.0)
MCH: 29.6 pg (ref 26.0–34.0)
MCHC: 32.3 g/dL (ref 30.0–36.0)
MCV: 91.8 fL (ref 80.0–100.0)
Monocytes Absolute: 0.6 10*3/uL (ref 0.1–1.0)
Monocytes Relative: 9 %
Neutro Abs: 5.3 10*3/uL (ref 1.7–7.7)
Neutrophils Relative %: 75 %
Platelets: 227 10*3/uL (ref 150–400)
RBC: 4.49 MIL/uL (ref 4.22–5.81)
RDW: 13.2 % (ref 11.5–15.5)
WBC: 7 10*3/uL (ref 4.0–10.5)
nRBC: 0 % (ref 0.0–0.2)

## 2020-03-19 LAB — URINALYSIS, ROUTINE W REFLEX MICROSCOPIC
Bacteria, UA: NONE SEEN
Bilirubin Urine: NEGATIVE
Glucose, UA: NEGATIVE mg/dL
Ketones, ur: NEGATIVE mg/dL
Nitrite: NEGATIVE
Protein, ur: >=300 mg/dL — AB
Specific Gravity, Urine: 1.015 (ref 1.005–1.030)
pH: 5 (ref 5.0–8.0)

## 2020-03-19 LAB — RAPID URINE DRUG SCREEN, HOSP PERFORMED
Amphetamines: NOT DETECTED
Barbiturates: NOT DETECTED
Benzodiazepines: NOT DETECTED
Cocaine: POSITIVE — AB
Opiates: NOT DETECTED
Tetrahydrocannabinol: NOT DETECTED

## 2020-03-19 LAB — CBG MONITORING, ED: Glucose-Capillary: 123 mg/dL — ABNORMAL HIGH (ref 70–99)

## 2020-03-19 LAB — ETHANOL: Alcohol, Ethyl (B): <10 mg/dL (ref <10)

## 2020-03-19 MED ORDER — LEVETIRACETAM IN NACL 1000 MG/100ML IV SOLN
1000.0000 mg | Freq: Once | INTRAVENOUS | Status: AC
  Administered 2020-03-19: 1000 mg via INTRAVENOUS
  Filled 2020-03-19: qty 100

## 2020-03-19 MED ORDER — LEVETIRACETAM 500 MG PO TABS
500.0000 mg | ORAL_TABLET | Freq: Two times a day (BID) | ORAL | 1 refills | Status: DC
Start: 2020-03-19 — End: 2020-07-01

## 2020-03-19 NOTE — ED Provider Notes (Signed)
[facility] DEPT Provider Note   CSN: [id number] Arrival date & time: 03/19/20  1259     History Chief Complaint  Patient presents with  . Seizures    Tim Bradley is a 69 y.o. male with PMH significant for HTN, type II DM, alcohol abuse, polysubstance use disorder, and seizure-like activity presents the ED via EMS for seizure.  EMS reports that patient was postictal for an additional 10 to 12 minutes after they arrived to the scene.  He was no longer seizing at that point, but evidently his roommate watched him seize for 5 minutes duration before it resolved spontaneously.  Patient reports that he never in his life had seizures until approximately 1 year ago.  He has not been seen in the ER for nearly 8 months, however he states that his seizures have been increasing in frequency.  He typically has one episode every 1 to 2 weeks.  He felt entirely fine leading up to his seizure today and otherwise would have had no complaints.  He states that he does not drink alcohol with the frequency that he used to, however does admit to drinking a little bit a few days ago.  He also states that he will recreationally use cocaine, however none in the past 48 hours.  He denies any other illicit drug use.  He did have an episode of incontinence with today's seizure-like episode and admits that he was particularly confused subsequent to his seizure.  I attempted to call the roommate, but she was unable to be reached.  According to the patient, his episodes typically will last that long prior to resolution.  Patient has not followed up with a primary care provider at the [facility] for several months and states that he does not take his Metformin medication anymore.  He denies ever having been on antiseizure medications.  Patient denies any fevers or chills, recent illness or infection, chest pain or shortness of breath, cough, numbness or weakness, blurred vision, headache, tongue biting, back  pain, or other symptoms.  HPI     Past Medical History:  Diagnosis Date  . Abscess 02/2017   LEFT GLUTEAL   . Acid reflux   . Alcohol abuse   . Cocaine abuse ([place])   . DDD (degenerative disc disease), lumbar   . Diabetes mellitus without complication ([place])   . Enlarged prostate   . Hypertension   . Spinal stenosis, lumbar     Patient Active Problem List   Diagnosis Date Noted  . Seizure ([place]) 07/21/2019  . Cocaine abuse ([place])   . Constipation 08/22/2018  . Gastritis 08/20/2018  . Heme positive stool   . Acute blood loss anemia secondary to massive gastric ulcer   . Hyponatremia   . Abscess, gluteal, left 03/26/2017  . Type 2 diabetes mellitus ([place]) 03/26/2017  . Adrenal nodule ([place]) 03/26/2017  . Closed fracture of body of thoracic vertebra ([place]) 03/26/2017  . Spinal stenosis of lumbar region 03/26/2017    Past Surgical History:  Procedure Laterality Date  . BACK SURGERY     cervical x2  . BIOPSY  08/21/2018   Procedure: BIOPSY;  Surgeon: Ronald Lobo, MD;  Location: [place];  Service: Endoscopy;;  . ESOPHAGOGASTRODUODENOSCOPY (EGD) WITH PROPOFOL N/A 08/21/2018   Procedure: ESOPHAGOGASTRODUODENOSCOPY (EGD) WITH PROPOFOL;  Surgeon: Ronald Lobo, MD;  Location: [place];  Service: Endoscopy;  Laterality: N/A;  . INCISION AND DRAINAGE PERIRECTAL ABSCESS N/A 03/26/2017   Procedure: IRRIGATION AND DEBRIDEMENT PERIRECTAL  ABSCESS;  Surgeon: Rolm Bookbinder, MD;  Location: [place];  Service: General;  Laterality: N/A;       Family History  Problem Relation Age of Onset  . Hypertension Mother   . Cancer - Lung Father     Social History   Tobacco Use  . Smoking status: Current Every Day Smoker    Packs/day: 0.25    Types: Cigarettes  . Smokeless tobacco: Never Used  Vaping Use  . Vaping Use: Never used  Substance Use Topics  . Alcohol use: Yes    Alcohol/week: 12.0 standard drinks    Types: 12 Cans of beer per week    Comment: i can of beer  daily  . Drug use: Yes    Types: Cocaine, Marijuana    Comment: cocaine last use Wednesday 07/20/2019    Home Medications Prior to Admission medications   Medication Sig Start Date End Date Taking? Authorizing Provider  tamsulosin (FLOMAX) 0.4 MG CAPS capsule Take 1 capsule (0.4 mg total) by mouth daily after supper. 01/27/20  Yes Deno Etienne, DO  triamterene-hydrochlorothiazide (MAXZIDE) 75-50 MG per tablet Take 1 tablet by mouth daily.   Yes [provider]  diclofenac sodium (VOLTAREN) 1 % GEL Apply 2 g topically 2 (two) times daily as needed (to affected areas- for pain). Patient not taking: Reported on 03/19/2020    [provider]  dicyclomine (BENTYL) 20 MG tablet Take 1 tablet (20 mg total) by mouth 3 (three) times daily before meals. Patient not taking: Reported on 03/19/2020 08/17/18   Orpah Greek, MD  docusate sodium (COLACE) 250 MG capsule Take 1 capsule (250 mg total) by mouth daily. Patient not taking: Reported on 03/19/2020 08/13/18   Charlann Lange, PA-C  levETIRAcetam (KEPPRA) 500 MG tablet Take 1 tablet (500 mg total) by mouth 2 (two) times daily. 03/19/20   Corena Herter, PA-C  pantoprazole (PROTONIX) 40 MG tablet Take 1 tablet (40 mg total) by mouth 2 (two) times daily before a meal. Patient not taking: Reported on 03/19/2020 09/12/18   Street, [place], PA-C  polyethylene glycol [facility]) packet Use one dose up to three times daily, maximum 3 consecutive days Patient not taking: Reported on 03/19/2020 08/13/18   Charlann Lange, PA-C  sucralfate (CARAFATE) 1 g tablet Take 1 tablet (1 g total) by mouth 4 (four) times daily. Patient not taking: Reported on 03/19/2020 08/22/18   Welford Roche, MD    Allergies    Patient has no known allergies.  Review of Systems   Review of Systems  Physical Exam Updated Vital Signs BP (!) 146/92   Pulse 68   Temp 98.3 F (36.8 C) (Oral)   Resp 12   SpO2 98%   Physical Exam Vitals and nursing  note reviewed. Exam conducted with a chaperone present.  Constitutional:      Comments: Smells of urine.  HENT:     Head: Normocephalic and atraumatic.     Mouth/Throat:     Comments: No tongue biting. Eyes:     General: No scleral icterus.    Extraocular Movements: Extraocular movements intact.     Conjunctiva/sclera: Conjunctivae normal.  Cardiovascular:     Rate and Rhythm: Normal rate and regular rhythm.     Pulses: Normal pulses.     Heart sounds: Normal heart sounds.  Pulmonary:     Effort: Pulmonary effort is normal.  Abdominal:     General: Abdomen is flat. There is no distension.     Palpations: Abdomen  is soft.     Tenderness: There is no abdominal tenderness.  Musculoskeletal:        General: Normal range of motion.     Cervical back: Normal range of motion. No rigidity.     Comments: Sebaceous cyst on upper thoracic back, not over midline.  No midline TTP or overlying skin changes.  Skin:    General: Skin is dry.     Capillary Refill: Capillary refill takes less than 2 seconds.  Neurological:     Mental Status: He is alert.     GCS: GCS eye subscore is 4. GCS verbal subscore is 5. GCS motor subscore is 6.     Comments: Alert and oriented x4.  Can move all extremities.  Sensation intact throughout.  Peripheral pulses intact.  Can ambulate without ataxia or dizziness.  Psychiatric:        Mood and Affect: Mood normal.        Behavior: Behavior normal.        Thought Content: Thought content normal.     ED Results / Procedures / Treatments   Labs (all labs ordered are listed, but only abnormal results are displayed) Labs Reviewed  COMPREHENSIVE METABOLIC PANEL - Abnormal; Notable for the following components:      Result Value   Glucose, Bld 147 (*)    BUN 38 (*)    Creatinine, Ser 2.41 (*)    Calcium 8.5 (*)    Total Protein 6.4 (*)    Albumin 3.3 (*)    GFR calc non Af Amer 26 (*)    GFR calc Af Amer 30 (*)    All other components within normal limits   RAPID URINE DRUG SCREEN, HOSP PERFORMED - Abnormal; Notable for the following components:   Cocaine POSITIVE (*)    All other components within normal limits  URINALYSIS, ROUTINE W REFLEX MICROSCOPIC - Abnormal; Notable for the following components:   Hgb urine dipstick SMALL (*)    Protein, ur >=300 (*)    Leukocytes,Ua SMALL (*)    All other components within normal limits  CBG MONITORING, ED - Abnormal; Notable for the following components:   Glucose-Capillary 123 (*)    All other components within normal limits  CBC WITH DIFFERENTIAL/PLATELET  ETHANOL    EKG None  Radiology No results found.  Procedures Procedures (including critical care time)  Medications Ordered in ED Medications  levETIRAcetam (KEPPRA) IVPB 1000 mg/100 mL premix (0 mg Intravenous Stopped 03/19/20 1446)    ED Course  I have reviewed the triage vital signs and the nursing notes.  Pertinent labs & imaging results that were available during my care of the patient were reviewed by me and considered in my medical decision making (see chart for details).    MDM Rules/Calculators/A&P                          Reviewed patient's medical record and he had CT and MR brain obtained 07/21/2019 which revealed no acute intracranial abnormalities that might otherwise explain his relatively newly onset seizure disorder.  CMP is notable for progressively worsening renal function with creatinine elevated 2.41 and GFR reduced to 30.  Encouraged patient to follow-up with his primary care provider as soon as possible regarding this progression and for ongoing evaluation.  UDS performed here in the ED is significant for cocaine which may have precipitated his seizure here today.  Alcohol less than 10, however he is adamant  that this was not a withdrawal induced seizure.  There is no tachycardia, tremor, or other signs or symptoms on my examination otherwise concerning for alcohol withdrawal.  On subsequent evaluation,  patient is accompanied by his best friend Collie Siad.  Collie Siad informs me that she is unhappy with his substance use and has been making an effort to help him coordinate efforts to improve his health and wellbeing.  She will happily assist him with any outpatient follow-up.  Patient can be safely discharged with Keppra 500 mg twice daily as initially directed by hospitalist after admission for seizure like activity in November 2020.  Will refer him to a neurologist for ongoing evaluation and management.  I discussed case with Dr. Sedonia Small who agrees with assessment and plan.    Patient provided strict instructions to avoid driving , bathing, and swimming until being cleared by neurology.  Patient voiced understanding and is agreeable to those precautions.  Strict ED return precautions discussed.  All of the evaluation and work-up results were discussed with the patient and any family at bedside. They were provided opportunity to ask any additional questions and have none at this time. They have expressed understanding of verbal discharge instructions as well as return precautions and are agreeable to the plan.    Final Clinical Impression(s) / ED Diagnoses Final diagnoses:  Seizure-like activity [facility])    Rx / DC Orders ED Discharge Orders         Ordered    levETIRAcetam (KEPPRA) 500 MG tablet  2 times daily     Discontinue  Reprint     03/19/20 1707           Corena Herter, PA-C 03/19/20 1710    Maudie Flakes, MD 03/20/20 1615

## 2020-03-19 NOTE — ED Notes (Signed)
Family at bedside. 

## 2020-03-19 NOTE — ED Notes (Signed)
Patient provided with urinal and made aware urine sample is needed. 

## 2020-03-19 NOTE — ED Triage Notes (Signed)
Per EMS, patient from home, witnessed seizure by roommate lasting approximately five minutes. Postictal upon EMS arrival. Hx seizures. Patient reports "[facility] is [facility]."  20g L FA

## 2020-03-19 NOTE — Discharge Instructions (Addendum)
Please follow-up with your primary care provider at the [facility] regarding this encounter.  You will need to have ongoing evaluation and management of your progressively worsening kidney function.  I would also like for you to call [facility] to schedule an appointment for ongoing evaluation and management of your seizure disorder.  They may wish to obtain further imaging/laboratory work-up.  Please return to the ED or seek immediate medical attention should you experience any new or worsening symptoms.

## 2020-04-10 ENCOUNTER — Encounter (HOSPITAL_COMMUNITY): Payer: Self-pay

## 2020-04-10 ENCOUNTER — Inpatient Hospital Stay (HOSPITAL_COMMUNITY)
Admission: EM | Admit: 2020-04-10 | Discharge: 2020-04-13 | DRG: 291 | Discharge status: Against Medical Advice | Payer: No Typology Code available for payment source | Attending: Internal Medicine | Admitting: Internal Medicine

## 2020-04-10 ENCOUNTER — Emergency Department (HOSPITAL_COMMUNITY): Payer: No Typology Code available for payment source

## 2020-04-10 ENCOUNTER — Other Ambulatory Visit: Payer: Self-pay

## 2020-04-10 DIAGNOSIS — Z9119 Patient's noncompliance with other medical treatment and regimen: Secondary | ICD-10-CM

## 2020-04-10 DIAGNOSIS — F1210 Cannabis abuse, uncomplicated: Secondary | ICD-10-CM | POA: Diagnosis present

## 2020-04-10 DIAGNOSIS — I714 Abdominal aortic aneurysm, without rupture: Secondary | ICD-10-CM | POA: Diagnosis present

## 2020-04-10 DIAGNOSIS — N179 Acute kidney failure, unspecified: Secondary | ICD-10-CM | POA: Diagnosis present

## 2020-04-10 DIAGNOSIS — I10 Essential (primary) hypertension: Secondary | ICD-10-CM

## 2020-04-10 DIAGNOSIS — R609 Edema, unspecified: Secondary | ICD-10-CM | POA: Diagnosis not present

## 2020-04-10 DIAGNOSIS — K219 Gastro-esophageal reflux disease without esophagitis: Secondary | ICD-10-CM | POA: Diagnosis present

## 2020-04-10 DIAGNOSIS — G40909 Epilepsy, unspecified, not intractable, without status epilepticus: Secondary | ICD-10-CM | POA: Diagnosis present

## 2020-04-10 DIAGNOSIS — Z20822 Contact with and (suspected) exposure to covid-19: Secondary | ICD-10-CM | POA: Diagnosis present

## 2020-04-10 DIAGNOSIS — F17210 Nicotine dependence, cigarettes, uncomplicated: Secondary | ICD-10-CM | POA: Diagnosis present

## 2020-04-10 DIAGNOSIS — N400 Enlarged prostate without lower urinary tract symptoms: Secondary | ICD-10-CM

## 2020-04-10 DIAGNOSIS — I509 Heart failure, unspecified: Secondary | ICD-10-CM

## 2020-04-10 DIAGNOSIS — I5043 Acute on chronic combined systolic (congestive) and diastolic (congestive) heart failure: Secondary | ICD-10-CM | POA: Diagnosis present

## 2020-04-10 DIAGNOSIS — N401 Enlarged prostate with lower urinary tract symptoms: Secondary | ICD-10-CM | POA: Diagnosis present

## 2020-04-10 DIAGNOSIS — R569 Unspecified convulsions: Secondary | ICD-10-CM

## 2020-04-10 DIAGNOSIS — F1410 Cocaine abuse, uncomplicated: Secondary | ICD-10-CM | POA: Diagnosis present

## 2020-04-10 DIAGNOSIS — R0602 Shortness of breath: Secondary | ICD-10-CM

## 2020-04-10 DIAGNOSIS — I447 Left bundle-branch block, unspecified: Secondary | ICD-10-CM | POA: Diagnosis present

## 2020-04-10 DIAGNOSIS — E1122 Type 2 diabetes mellitus with diabetic chronic kidney disease: Secondary | ICD-10-CM | POA: Diagnosis present

## 2020-04-10 DIAGNOSIS — Z79899 Other long term (current) drug therapy: Secondary | ICD-10-CM

## 2020-04-10 DIAGNOSIS — E872 Acidosis: Secondary | ICD-10-CM | POA: Diagnosis present

## 2020-04-10 DIAGNOSIS — N184 Chronic kidney disease, stage 4 (severe): Secondary | ICD-10-CM | POA: Diagnosis present

## 2020-04-10 DIAGNOSIS — R531 Weakness: Secondary | ICD-10-CM

## 2020-04-10 DIAGNOSIS — I130 Hypertensive heart and chronic kidney disease with heart failure and stage 1 through stage 4 chronic kidney disease, or unspecified chronic kidney disease: Secondary | ICD-10-CM | POA: Diagnosis not present

## 2020-04-10 DIAGNOSIS — R0600 Dyspnea, unspecified: Secondary | ICD-10-CM

## 2020-04-10 DIAGNOSIS — R600 Localized edema: Secondary | ICD-10-CM

## 2020-04-10 HISTORY — DX: Anemia, unspecified: D64.9

## 2020-04-10 HISTORY — DX: Heart failure, unspecified: I50.9

## 2020-04-10 HISTORY — DX: Dyspnea, unspecified: R06.00

## 2020-04-10 LAB — BASIC METABOLIC PANEL
Anion gap: 9 (ref 5–15)
BUN: 46 mg/dL — ABNORMAL HIGH (ref 8–23)
CO2: 19 mmol/L — ABNORMAL LOW (ref 22–32)
Calcium: 8.9 mg/dL (ref 8.9–10.3)
Chloride: 113 mmol/L — ABNORMAL HIGH (ref 98–111)
Creatinine, Ser: 2.61 mg/dL — ABNORMAL HIGH (ref 0.61–1.24)
GFR calc Af Amer: 27 mL/min — ABNORMAL LOW (ref >60)
GFR calc non Af Amer: 24 mL/min — ABNORMAL LOW (ref >60)
Glucose, Bld: 96 mg/dL (ref 70–99)
Potassium: 5.3 mmol/L — ABNORMAL HIGH (ref 3.5–5.1)
Sodium: 141 mmol/L (ref 135–145)

## 2020-04-10 LAB — CBC
HCT: 39.2 % (ref 39.0–52.0)
Hemoglobin: 12.5 g/dL — ABNORMAL LOW (ref 13.0–17.0)
MCH: 28.9 pg (ref 26.0–34.0)
MCHC: 31.9 g/dL (ref 30.0–36.0)
MCV: 90.7 fL (ref 80.0–100.0)
Platelets: 235 10*3/uL (ref 150–400)
RBC: 4.32 MIL/uL (ref 4.22–5.81)
RDW: 13.3 % (ref 11.5–15.5)
WBC: 6.2 10*3/uL (ref 4.0–10.5)
nRBC: 0 % (ref 0.0–0.2)

## 2020-04-10 LAB — TROPONIN I (HIGH SENSITIVITY): Troponin I (High Sensitivity): 18 ng/L — ABNORMAL HIGH (ref <18)

## 2020-04-10 LAB — BRAIN NATRIURETIC PEPTIDE: B Natriuretic Peptide: 1366.8 pg/mL — ABNORMAL HIGH (ref 0.0–100.0)

## 2020-04-10 MED ORDER — FUROSEMIDE 10 MG/ML IJ SOLN
40.0000 mg | INTRAMUSCULAR | Status: AC
  Administered 2020-04-10: 40 mg via INTRAVENOUS
  Filled 2020-04-10: qty 4

## 2020-04-10 NOTE — ED Triage Notes (Signed)
Pt from home with ems for bilateral leg swelling and pain, hx of CHF already taking lasix. Pt had a fall yesterday because of the pain, no injuries from the fall, ems came out and evaluated him. VSS pt a.o

## 2020-04-10 NOTE — ED Notes (Signed)
EDP at bedside  

## 2020-04-10 NOTE — ED Provider Notes (Signed)
[name] Provider Note   CSN: [id number] Arrival date & time: 04/10/20  1215     History Chief Complaint  Patient presents with  . Leg Swelling    Tim Bradley is a 69 y.o. male.  The history is provided by the patient and medical records.    69 y.o. M with hx of acid reflux, alcohol abuse, CHF, cocaine abuse, DM, HTN, seizure, presenting to the ED for leg swelling and SOB.  States leg swelling has been worsening over the past few weeks, both legs equally bad.  States his legs feel very "heavy" and he does not have the strength to get up and move around.  States he has fallen x3 since this began, most recent was yesterday.  States he has not injured himself during the falls, no head trauma or LOC.  States his SOB has developed over the past few days, worse at night when lying flat.  He is sleeping propped up.  He is unsure of any significant weight gain, he does not check his weights at home. He is on fluid pills from the [place] and reports he has been compliant with medications.  Has had good urine output.  Denies chest pain, fevers, cough, or other infectious symptoms.  Patient reports he lives alone, wife died 3 years ago.  States he has walker but mostly uses his wheelchair in the house but is hard to navigate due to the furniture.  He does have home health aid that comes every so often but has asked for them to come regularly-- states the [place] is supposed to be setting this up.  Past Medical History:  Diagnosis Date  . Abscess 02/2017   LEFT GLUTEAL   . Acid reflux   . Alcohol abuse   . CHF (congestive heart failure) ([place])   . Cocaine abuse ([place])   . DDD (degenerative disc disease), lumbar   . Diabetes mellitus without complication ([place])   . Enlarged prostate   . Hypertension   . Spinal stenosis, lumbar     Patient Active Problem List   Diagnosis Date Noted  . Seizure ([place]) 07/21/2019  . Cocaine abuse ([place])   . Constipation 08/22/2018   . Gastritis 08/20/2018  . Heme positive stool   . Acute blood loss anemia secondary to massive gastric ulcer   . Hyponatremia   . Abscess, gluteal, left 03/26/2017  . Type 2 diabetes mellitus ([place]) 03/26/2017  . Adrenal nodule ([place]) 03/26/2017  . Closed fracture of body of thoracic vertebra ([place]) 03/26/2017  . Spinal stenosis of lumbar region 03/26/2017    Past Surgical History:  Procedure Laterality Date  . BACK SURGERY     cervical x2  . BIOPSY  08/21/2018   Procedure: BIOPSY;  Surgeon: Ronald Lobo, MD;  Location: [facility];  Service: Endoscopy;;  . ESOPHAGOGASTRODUODENOSCOPY (EGD) WITH PROPOFOL N/A 08/21/2018   Procedure: ESOPHAGOGASTRODUODENOSCOPY (EGD) WITH PROPOFOL;  Surgeon: Ronald Lobo, MD;  Location: [facility];  Service: Endoscopy;  Laterality: N/A;  . INCISION AND DRAINAGE PERIRECTAL ABSCESS N/A 03/26/2017   Procedure: IRRIGATION AND DEBRIDEMENT PERIRECTAL ABSCESS;  Surgeon: Rolm Bookbinder, MD;  Location: [facility];  Service: General;  Laterality: N/A;       Family History  Problem Relation Age of Onset  . Hypertension Mother   . Cancer - Lung Father     Social History   Tobacco Use  . Smoking status: Current Every Day Smoker    Packs/day: 0.25  Types: Cigarettes  . Smokeless tobacco: Never Used  Vaping Use  . Vaping Use: Never used  Substance Use Topics  . Alcohol use: Yes    Alcohol/week: 12.0 standard drinks    Types: 12 Cans of beer per week    Comment: i can of beer daily  . Drug use: Yes    Types: Cocaine, Marijuana    Comment: cocaine last use Wednesday 07/20/2019    Home Medications Prior to Admission medications   Medication Sig Start Date End Date Taking? Authorizing Provider  diclofenac sodium (VOLTAREN) 1 % GEL Apply 2 g topically 2 (two) times daily as needed (to affected areas- for pain). Patient not taking: Reported on 03/19/2020    [provider]  dicyclomine (BENTYL) 20 MG tablet Take 1 tablet (20 mg total) by  mouth 3 (three) times daily before meals. Patient not taking: Reported on 03/19/2020 08/17/18   Orpah Greek, MD  docusate sodium (COLACE) 250 MG capsule Take 1 capsule (250 mg total) by mouth daily. Patient not taking: Reported on 03/19/2020 08/13/18   Charlann Lange, PA-C  levETIRAcetam (KEPPRA) 500 MG tablet Take 1 tablet (500 mg total) by mouth 2 (two) times daily. 03/19/20   Corena Herter, PA-C  pantoprazole (PROTONIX) 40 MG tablet Take 1 tablet (40 mg total) by mouth 2 (two) times daily before a meal. Patient not taking: Reported on 03/19/2020 09/12/18   Street, [place], PA-C  polyethylene glycol [facility]) packet Use one dose up to three times daily, maximum 3 consecutive days Patient not taking: Reported on 03/19/2020 08/13/18   Charlann Lange, PA-C  sucralfate (CARAFATE) 1 g tablet Take 1 tablet (1 g total) by mouth 4 (four) times daily. Patient not taking: Reported on 03/19/2020 08/22/18   Welford Roche, MD  tamsulosin (FLOMAX) 0.4 MG CAPS capsule Take 1 capsule (0.4 mg total) by mouth daily after supper. 01/27/20   Deno Etienne, DO  triamterene-hydrochlorothiazide (MAXZIDE) 75-50 MG per tablet Take 1 tablet by mouth daily.    [provider]    Allergies    Patient has no known allergies.  Review of Systems   Review of Systems  Respiratory: Positive for shortness of breath.   Cardiovascular: Positive for leg swelling.  All other systems reviewed and are negative.   Physical Exam Updated Vital Signs BP (!) 140/97 (BP Location: Right Arm)   Pulse 80   Temp 98.3 F (36.8 C) (Oral)   Resp 15   Ht 6\' 2"  (1.88 m)   Wt 90.7 kg   SpO2 100%   BMI 25.68 kg/m   Physical Exam Vitals and nursing note reviewed.  Constitutional:      Appearance: He is well-developed.     Comments: elderly  HENT:     Head: Normocephalic and atraumatic.  Eyes:     Conjunctiva/sclera: Conjunctivae normal.     Pupils: Pupils are equal, round, and reactive to light.   Cardiovascular:     Rate and Rhythm: Normal rate and regular rhythm.     Heart sounds: Normal heart sounds.  Pulmonary:     Effort: Pulmonary effort is normal.     Breath sounds: Normal breath sounds.     Comments: Lungs grossly clear, NAD, able to speak in full sentences, O2 sats 98% during exam on RA Abdominal:     General: Bowel sounds are normal.     Palpations: Abdomen is soft.  Musculoskeletal:        General: Normal range of motion.  Cervical back: Normal range of motion.     Comments: 2+ pitting edema BLE, no calf asymmetry, tenderness, or palpable cords  Skin:    General: Skin is warm and dry.  Neurological:     Mental Status: He is alert and oriented to person, place, and time.     ED Results / Procedures / Treatments   Labs (all labs ordered are listed, but only abnormal results are displayed) Labs Reviewed  BASIC METABOLIC PANEL - Abnormal; Notable for the following components:      Result Value   Potassium 5.3 (*)    Chloride 113 (*)    CO2 19 (*)    BUN 46 (*)    Creatinine, Ser 2.61 (*)    GFR calc non Af Amer 24 (*)    GFR calc Af Amer 27 (*)    All other components within normal limits  CBC - Abnormal; Notable for the following components:   Hemoglobin 12.5 (*)    All other components within normal limits  BRAIN NATRIURETIC PEPTIDE - Abnormal; Notable for the following components:   B Natriuretic Peptide 1,366.8 (*)    All other components within normal limits  URINALYSIS, ROUTINE W REFLEX MICROSCOPIC - Abnormal; Notable for the following components:   Color, Urine COLORLESS (*)    Hgb urine dipstick SMALL (*)    Protein, ur 30 (*)    All other components within normal limits  TROPONIN I (HIGH SENSITIVITY) - Abnormal; Notable for the following components:   Troponin I (High Sensitivity) 18 (*)    All other components within normal limits  TROPONIN I (HIGH SENSITIVITY)    EKG EKG Interpretation  Date/Time:  Tuesday April 10 2020 12:26:20  EDT Ventricular Rate:  90 PR Interval:  146 QRS Duration: 144 QT Interval:  380 QTC Calculation: 464 R Axis:   -117 Text Interpretation: Normal sinus rhythm Right atrial enlargement Right superior axis deviation Left bundle branch block Abnormal ECG Confirmed by Pattricia Boss [phone]) on 04/10/2020 2:43:54 PM   Radiology DG Chest 2 View  Result Date: 04/10/2020 CLINICAL DATA:  Shortness of breath EXAM: CHEST - 2 VIEW COMPARISON:  2018 FINDINGS: Mild chronic interstitial prominence with pulmonary vascular congestion. No pleural effusion or pneumothorax. Stable cardiomediastinal contours. No acute osseous abnormality. IMPRESSION: Mild pulmonary vascular congestion. Electronically Signed   By: Macy Mis M.D.   On: 04/10/2020 12:53    Procedures Procedures (including critical care time)  Medications Ordered in ED Medications  furosemide (LASIX) injection 40 mg (40 mg Intravenous Given 04/10/20 2257)    ED Course  I have reviewed the triage vital signs and the nursing notes.  Pertinent labs & imaging results that were available during my care of the patient were reviewed by me and considered in my medical decision making (see chart for details).    MDM Rules/Calculators/A&P  69 y.o. M presenting to the ED for peripheral edema and SOB.  States leg swelling has been worsening over the past few weeks, SOB over the past few days.  He is afebrile, non-toxic.  Does have 2+ pitting edema of BLE, no rales noted. No signs of respiratory distress currently.  Basic labs reassuring.  CXR with some vascular congestion.  Will add on BNP and troponin.  Given IV lasix.  Troponin just at threshold of normal of 18.  BNP 1366.  Patient starting to diurese.  Will monitor here and obtain delta troponin.  2:08 AM Delta troponin is now within normal limits at  14.  Patient has diuresed out almost 1 L, states he is feeling better.  His oxygen saturation remained stable on room air.  He denies any current  shortness of breath or chest pain.  Discussed with patient that he will need some further diuresis over the next few days, he would like to do this at home.  As his vitals remained stable on room air and troponin has down trended, I feel this is reasonable to attempt, however he is aware he may worsen and require repeat ER visit/admission.  We will plan to discharge home with a few days of Lasix and close PCP follow-up.  3:00 AM Patient now does not feel like he can go home.  He is unable to even stand at the bedside on his own, he required 2 person assist.  Patient does live alone.  In his current state, he is likely going to have a hard time caring for himself, especially if requiring increased diuresis.    Discussed with Dr. Tonie Griffith-- will admit for ongoing care.  Final Clinical Impression(s) / ED Diagnoses Final diagnoses:  Peripheral edema  Acute on chronic congestive heart failure, unspecified heart failure type [facility])    Rx / DC Orders ED Discharge Orders    None       Larene Pickett, PA-C 04/11/20 [id number]    Sherwood Gambler, MD 04/11/20 [phone]

## 2020-04-11 ENCOUNTER — Encounter (HOSPITAL_COMMUNITY): Payer: Self-pay | Admitting: Family Medicine

## 2020-04-11 ENCOUNTER — Inpatient Hospital Stay (HOSPITAL_COMMUNITY): Payer: No Typology Code available for payment source

## 2020-04-11 DIAGNOSIS — N400 Enlarged prostate without lower urinary tract symptoms: Secondary | ICD-10-CM | POA: Diagnosis not present

## 2020-04-11 DIAGNOSIS — I5041 Acute combined systolic (congestive) and diastolic (congestive) heart failure: Secondary | ICD-10-CM | POA: Diagnosis not present

## 2020-04-11 DIAGNOSIS — R569 Unspecified convulsions: Secondary | ICD-10-CM

## 2020-04-11 DIAGNOSIS — R0602 Shortness of breath: Secondary | ICD-10-CM | POA: Diagnosis not present

## 2020-04-11 DIAGNOSIS — R531 Weakness: Secondary | ICD-10-CM

## 2020-04-11 DIAGNOSIS — J449 Chronic obstructive pulmonary disease, unspecified: Secondary | ICD-10-CM | POA: Diagnosis present

## 2020-04-11 DIAGNOSIS — N401 Enlarged prostate with lower urinary tract symptoms: Secondary | ICD-10-CM | POA: Diagnosis present

## 2020-04-11 DIAGNOSIS — N184 Chronic kidney disease, stage 4 (severe): Secondary | ICD-10-CM | POA: Diagnosis present

## 2020-04-11 DIAGNOSIS — I447 Left bundle-branch block, unspecified: Secondary | ICD-10-CM | POA: Diagnosis present

## 2020-04-11 DIAGNOSIS — G40909 Epilepsy, unspecified, not intractable, without status epilepticus: Secondary | ICD-10-CM

## 2020-04-11 DIAGNOSIS — F1410 Cocaine abuse, uncomplicated: Secondary | ICD-10-CM | POA: Diagnosis present

## 2020-04-11 DIAGNOSIS — F1210 Cannabis abuse, uncomplicated: Secondary | ICD-10-CM | POA: Diagnosis present

## 2020-04-11 DIAGNOSIS — I5021 Acute systolic (congestive) heart failure: Secondary | ICD-10-CM

## 2020-04-11 DIAGNOSIS — Z79899 Other long term (current) drug therapy: Secondary | ICD-10-CM | POA: Diagnosis not present

## 2020-04-11 DIAGNOSIS — Z20822 Contact with and (suspected) exposure to covid-19: Secondary | ICD-10-CM | POA: Diagnosis present

## 2020-04-11 DIAGNOSIS — E872 Acidosis: Secondary | ICD-10-CM | POA: Diagnosis present

## 2020-04-11 DIAGNOSIS — N179 Acute kidney failure, unspecified: Secondary | ICD-10-CM | POA: Diagnosis present

## 2020-04-11 DIAGNOSIS — R0600 Dyspnea, unspecified: Secondary | ICD-10-CM

## 2020-04-11 DIAGNOSIS — Z720 Tobacco use: Secondary | ICD-10-CM

## 2020-04-11 DIAGNOSIS — I82509 Chronic embolism and thrombosis of unspecified deep veins of unspecified lower extremity: Secondary | ICD-10-CM | POA: Diagnosis present

## 2020-04-11 DIAGNOSIS — I10 Essential (primary) hypertension: Secondary | ICD-10-CM | POA: Diagnosis not present

## 2020-04-11 DIAGNOSIS — Z9119 Patient's noncompliance with other medical treatment and regimen: Secondary | ICD-10-CM | POA: Diagnosis not present

## 2020-04-11 DIAGNOSIS — F17210 Nicotine dependence, cigarettes, uncomplicated: Secondary | ICD-10-CM | POA: Diagnosis present

## 2020-04-11 DIAGNOSIS — I130 Hypertensive heart and chronic kidney disease with heart failure and stage 1 through stage 4 chronic kidney disease, or unspecified chronic kidney disease: Secondary | ICD-10-CM | POA: Diagnosis present

## 2020-04-11 DIAGNOSIS — I714 Abdominal aortic aneurysm, without rupture: Secondary | ICD-10-CM | POA: Diagnosis present

## 2020-04-11 DIAGNOSIS — E1122 Type 2 diabetes mellitus with diabetic chronic kidney disease: Secondary | ICD-10-CM | POA: Diagnosis present

## 2020-04-11 DIAGNOSIS — I509 Heart failure, unspecified: Secondary | ICD-10-CM | POA: Diagnosis not present

## 2020-04-11 DIAGNOSIS — I5043 Acute on chronic combined systolic (congestive) and diastolic (congestive) heart failure: Secondary | ICD-10-CM | POA: Diagnosis present

## 2020-04-11 DIAGNOSIS — R609 Edema, unspecified: Secondary | ICD-10-CM | POA: Diagnosis present

## 2020-04-11 DIAGNOSIS — R600 Localized edema: Secondary | ICD-10-CM

## 2020-04-11 DIAGNOSIS — F1010 Alcohol abuse, uncomplicated: Secondary | ICD-10-CM

## 2020-04-11 DIAGNOSIS — K219 Gastro-esophageal reflux disease without esophagitis: Secondary | ICD-10-CM | POA: Diagnosis present

## 2020-04-11 HISTORY — DX: Chronic kidney disease, stage 4 (severe): N18.4

## 2020-04-11 LAB — COMPREHENSIVE METABOLIC PANEL
ALT: 15 U/L (ref 0–44)
AST: 18 U/L (ref 15–41)
Albumin: 3.2 g/dL — ABNORMAL LOW (ref 3.5–5.0)
Alkaline Phosphatase: 54 U/L (ref 38–126)
Anion gap: 13 (ref 5–15)
BUN: 46 mg/dL — ABNORMAL HIGH (ref 8–23)
CO2: 20 mmol/L — ABNORMAL LOW (ref 22–32)
Calcium: 9.4 mg/dL (ref 8.9–10.3)
Chloride: 110 mmol/L (ref 98–111)
Creatinine, Ser: 2.41 mg/dL — ABNORMAL HIGH (ref 0.61–1.24)
GFR calc Af Amer: 30 mL/min — ABNORMAL LOW (ref >60)
GFR calc non Af Amer: 26 mL/min — ABNORMAL LOW (ref >60)
Glucose, Bld: 86 mg/dL (ref 70–99)
Potassium: 4.7 mmol/L (ref 3.5–5.1)
Sodium: 143 mmol/L (ref 135–145)
Total Bilirubin: 0.7 mg/dL (ref 0.3–1.2)
Total Protein: 5.8 g/dL — ABNORMAL LOW (ref 6.5–8.1)

## 2020-04-11 LAB — RAPID URINE DRUG SCREEN, HOSP PERFORMED
Amphetamines: NOT DETECTED
Barbiturates: NOT DETECTED
Benzodiazepines: NOT DETECTED
Cocaine: POSITIVE — AB
Opiates: NOT DETECTED
Tetrahydrocannabinol: NOT DETECTED

## 2020-04-11 LAB — TSH: TSH: 1.213 u[IU]/mL (ref 0.350–4.500)

## 2020-04-11 LAB — CBC WITH DIFFERENTIAL/PLATELET
Abs Immature Granulocytes: 0.01 10*3/uL (ref 0.00–0.07)
Basophils Absolute: 0 10*3/uL (ref 0.0–0.1)
Basophils Relative: 1 %
Eosinophils Absolute: 0.1 10*3/uL (ref 0.0–0.5)
Eosinophils Relative: 2 %
HCT: 41.9 % (ref 39.0–52.0)
Hemoglobin: 13.3 g/dL (ref 13.0–17.0)
Immature Granulocytes: 0 %
Lymphocytes Relative: 18 %
Lymphs Abs: 1 10*3/uL (ref 0.7–4.0)
MCH: 28.7 pg (ref 26.0–34.0)
MCHC: 31.7 g/dL (ref 30.0–36.0)
MCV: 90.3 fL (ref 80.0–100.0)
Monocytes Absolute: 0.8 10*3/uL (ref 0.1–1.0)
Monocytes Relative: 13 %
Neutro Abs: 3.9 10*3/uL (ref 1.7–7.7)
Neutrophils Relative %: 66 %
Platelets: 231 10*3/uL (ref 150–400)
RBC: 4.64 MIL/uL (ref 4.22–5.81)
RDW: 13.2 % (ref 11.5–15.5)
WBC: 5.9 10*3/uL (ref 4.0–10.5)
nRBC: 0 % (ref 0.0–0.2)

## 2020-04-11 LAB — ECHOCARDIOGRAM COMPLETE
Height: 74 in
Weight: 3200 oz

## 2020-04-11 LAB — URINALYSIS, ROUTINE W REFLEX MICROSCOPIC
Bacteria, UA: NONE SEEN
Bilirubin Urine: NEGATIVE
Glucose, UA: NEGATIVE mg/dL
Ketones, ur: NEGATIVE mg/dL
Leukocytes,Ua: NEGATIVE
Nitrite: NEGATIVE
Protein, ur: 30 mg/dL — AB
Specific Gravity, Urine: 1.005 (ref 1.005–1.030)
pH: 5 (ref 5.0–8.0)

## 2020-04-11 LAB — MAGNESIUM: Magnesium: 2 mg/dL (ref 1.7–2.4)

## 2020-04-11 LAB — TROPONIN I (HIGH SENSITIVITY): Troponin I (High Sensitivity): 14 ng/L (ref <18)

## 2020-04-11 LAB — PHOSPHORUS: Phosphorus: 4.1 mg/dL (ref 2.5–4.6)

## 2020-04-11 LAB — SARS CORONAVIRUS 2 BY RT PCR (HOSPITAL ORDER, PERFORMED IN ~~LOC~~ HOSPITAL LAB): SARS Coronavirus 2: NEGATIVE

## 2020-04-11 MED ORDER — CARVEDILOL 3.125 MG PO TABS
3.1250 mg | ORAL_TABLET | Freq: Two times a day (BID) | ORAL | Status: DC
  Administered 2020-04-11 – 2020-04-13 (×5): 3.125 mg via ORAL
  Filled 2020-04-11 (×7): qty 1

## 2020-04-11 MED ORDER — HEPARIN SODIUM (PORCINE) 5000 UNIT/ML IJ SOLN
5000.0000 [IU] | Freq: Three times a day (TID) | INTRAMUSCULAR | Status: DC
  Administered 2020-04-11 – 2020-04-13 (×7): 5000 [IU] via SUBCUTANEOUS
  Filled 2020-04-11 (×7): qty 1

## 2020-04-11 MED ORDER — TRIAMTERENE-HCTZ 75-50 MG PO TABS: 1.0000 | ORAL_TABLET | Freq: Every day | ORAL | Status: DC

## 2020-04-11 MED ORDER — POTASSIUM CHLORIDE CRYS ER 10 MEQ PO TBCR
10.0000 meq | EXTENDED_RELEASE_TABLET | Freq: Two times a day (BID) | ORAL | Status: DC
  Administered 2020-04-11 – 2020-04-12 (×4): 10 meq via ORAL
  Filled 2020-04-11 (×6): qty 1

## 2020-04-11 MED ORDER — ACETAMINOPHEN 325 MG PO TABS: 650.0000 mg | ORAL_TABLET | ORAL | Status: DC | PRN

## 2020-04-11 MED ORDER — SODIUM CHLORIDE 0.9 % IV SOLN: 250.0000 mL | INTRAVENOUS | Status: DC | PRN

## 2020-04-11 MED ORDER — SODIUM CHLORIDE 0.9% FLUSH
3.0000 mL | Freq: Two times a day (BID) | INTRAVENOUS | Status: DC
  Administered 2020-04-11 – 2020-04-12 (×3): 3 mL via INTRAVENOUS

## 2020-04-11 MED ORDER — SACUBITRIL-VALSARTAN 24-26 MG PO TABS
1.0000 | ORAL_TABLET | Freq: Two times a day (BID) | ORAL | Status: DC
  Filled 2020-04-11 (×2): qty 1

## 2020-04-11 MED ORDER — ONDANSETRON HCL 4 MG/2ML IJ SOLN: 4.0000 mg | Freq: Four times a day (QID) | INTRAMUSCULAR | Status: DC | PRN

## 2020-04-11 MED ORDER — LEVETIRACETAM 500 MG PO TABS
500.0000 mg | ORAL_TABLET | Freq: Two times a day (BID) | ORAL | Status: DC
  Administered 2020-04-11 – 2020-04-13 (×5): 500 mg via ORAL
  Filled 2020-04-11 (×6): qty 1

## 2020-04-11 MED ORDER — FUROSEMIDE 40 MG PO TABS
40.0000 mg | ORAL_TABLET | Freq: Every day | ORAL | 0 refills | Status: DC
Start: 2020-04-11 — End: 2020-05-09

## 2020-04-11 MED ORDER — ISOSORB DINITRATE-HYDRALAZINE 20-37.5 MG PO TABS
1.0000 | ORAL_TABLET | Freq: Three times a day (TID) | ORAL | Status: DC
  Administered 2020-04-11 – 2020-04-13 (×5): 1 via ORAL
  Filled 2020-04-11 (×8): qty 1

## 2020-04-11 MED ORDER — FUROSEMIDE 10 MG/ML IJ SOLN
40.0000 mg | Freq: Two times a day (BID) | INTRAMUSCULAR | Status: AC
  Administered 2020-04-11 – 2020-04-12 (×3): 40 mg via INTRAVENOUS
  Filled 2020-04-11 (×3): qty 4

## 2020-04-11 MED ORDER — SODIUM CHLORIDE 0.9% FLUSH
3.0000 mL | INTRAVENOUS | Status: DC | PRN
  Administered 2020-04-11: 3 mL via INTRAVENOUS

## 2020-04-11 MED ORDER — ENOXAPARIN SODIUM 40 MG/0.4ML ~~LOC~~ SOLN: 40.0000 mg | SUBCUTANEOUS | Status: DC

## 2020-04-11 MED ORDER — ASPIRIN EC 81 MG PO TBEC
81.0000 mg | DELAYED_RELEASE_TABLET | Freq: Every day | ORAL | Status: DC
  Administered 2020-04-11 – 2020-04-13 (×3): 81 mg via ORAL
  Filled 2020-04-11 (×3): qty 1

## 2020-04-11 MED ORDER — TAMSULOSIN HCL 0.4 MG PO CAPS
0.4000 mg | ORAL_CAPSULE | Freq: Every day | ORAL | Status: DC
  Administered 2020-04-11 – 2020-04-12 (×2): 0.4 mg via ORAL
  Filled 2020-04-11 (×2): qty 1

## 2020-04-11 NOTE — ED Notes (Signed)
CALLED PTAR--Tim Bradley

## 2020-04-11 NOTE — Progress Notes (Signed)
Care started prior to midnight in the emergency room and patient was admitted early this morning after midnight by Dr. Harrold Donath and I am in current agreement with this assessment and plan.  Additional changes to the plan of care been made accordingly. The patient is a 69 year old African-American male with a past medical history difficult for but not limited to, hypertension, CKD stage IV, BPH, seizure disorder, history of type 2 diabetes mellitus as well as other comorbidities who presents with a chief complaint of leg swelling with generalized weakness as well as a fall at home.  Will last 2 weeks he reports increased leg swelling which has made it hard for him to get around the house.  He currently lives alone son reports a fall yesterday and complains of generalized weakness and had some difficulty getting up.  He is able to get to the phone to call EMS and transfer to the hospital.  Denies any chest pain, loss of consciousness or seizure activity and feels he has been compliant with his medications.  Has had home health arranged through the [place] in [place] but they have not come out to initiate yet.  He states that he does feel smothered when he lays flat he states that he has never been diagnosed with congestive heart failure.  He reports urinary hesitancy due to his BPH and has not had any dysuria.  He has a history of cocaine use with previous drug screens positive for cocaine but states that none recently.  He drinks beer every other day and smokes 1 pack of cigarettes a week.  In the ED he was found to have bilateral leg edema and the admitting physician noted bilateral rales on auscultation of his lungs.  His BNP is elevated and he does have chronic kidney disease stage IV and also generalized weakness and not able to stand without the help of 2-3 staff members.  Currently is being admitted for the following and treated for but not limited to:  Acute CHF (congestive heart failure) ([place]),  unknown type currently -Mr. Tim Bradley be admitted to cardiac telemetry floor.   -BNP was 1366.8  -Serial troponins will be obtained to make sure no cardiac ischemia as etiology of CHF. Initial Troponin I was 18 and repeat was 14 -Patient be diuresed with Lasix 40 mg IV twice daily for the next 6 hours.   -Strict I's and O's.  Monitor daily weight. -Obtain echocardiogram to evaluate wall motion, valvular structures, EF -Patient will be started on therapy for CHF with Coreg and Entresto. -Check UDS given Hx of Cocaine Use; Was started on Core so need to be careful -Continue to monitor for signs and symptoms of volume overload and repeat chest x-ray in a.m.  Essential Hypertension -Will stop home dose of Maxide given that he is going to be diuresed.  -Patient was started on Coreg and Entresto for blood pressure and CHF and will defer to Cardiology to change/adjust.   -Continue to Monitor blood pressure.  Leg Edema -Likely 2/2 to CHF.   -Being diuresed with Lasix -May consider obtaining LE venous Duplex  Dyspnea -Secondary to CHF.  -C/w Incentive spirometer hour to 2 hours while awake.  -SpO2: 100 % -C/w Supplemental oxygen as needed to keep O2 sat between 92-96%  CKD (chronic kidney disease) stage 4, GFR 15-29 ml/min (HCC) Metabolic acidosis -Stable CKD stage IV. -BUN/Cr went from 46/2.61 -> 46/2.41 -We will hold his Maxide given that he has been started on Coreg and Entresto  but may need to be changed given his renal dysfunction  -Patient CO2 is 20, anion gap is 13, chloride level is 110 -Avoid nephrotoxic medications, contrast dyes, hypotension and renally dose medications  -Repeat CMP in a.m.  Generalized Weakness -PT/OT to evaluate and Treat -Any home health needs will be identified and coordinated with home health  Seizure Disorder ([place]) -Continue home dose of Levetiracetam 500 mg po BID.   -C/w Seizure precautions  BPH (benign prostatic hyperplasia) -Continue home  dose of Tamsulosin 0.4 mg qHS  Tobacco use -Smoking cessation education to be provided prior to discharge  We will continue to monitor the patient's clinical response to intervention and repeat blood work and imaging in the a.m. as well as follow up on Cardiology evaluation recommendations

## 2020-04-11 NOTE — Consult Note (Addendum)
Cardiology Consultation:   Patient ID: Tim Bradley MRN: [id number]; DOB: 1951/05/04  Admit date: 04/10/2020 Date of Consult: 04/11/2020  Primary Care Provider: System, Pcp Not In [facility] Cardiologist: New to [facility] (Dr. Johnsie Cancel)  Patient Profile:   Tim Bradley is a 69 y.o. male with a hx of hypertension, chronic kidney disease stage IV, seizure disorder, cocaine abuse, diabetes mellitus, tobacco smoking and alcohol abuse who is being seen today for the evaluation of CHF at the request of Dr. Alfredia Ferguson.   History of Present Illness:   Mr. Gang has 3 weeks history of progressive worsening lower extremity edema.  He typically denies shortness of breath or orthopnea but reports sleeping on chair/couch in reclined position with pillows.  He thinks he cannot lay flat. History is unreliable.  He reports last cocaine use about a month ago however urine drugs positive for cocaine.  He smokes 1 pack of cigarette and drinks couple of beers.  Denies fever, chills, cough, congestion, palpitation, chest pain or syncope.  Also reports generalized weakness and leg giving out recently.  Had a fall as well.  Denies prior history of CHF, MI or stroke.  Reports taking his medications.  Endorse eating high salt diet every day.  BUN/SCr 38/2.41>>46/2.41 Urine drug screen positive for cocaine BNP 1366 High-sensitivity troponin 18>>14 TSH normal Covid negative Chest x-ray -mild vascular congestion  Admitted for acute CHF exacerbation and started on IV Lasix.  Breathing improving.  He is also started on Coreg and Entresto. Home Maxzide held.   Patient was found to have new onset left bundle branch block when admitted November 2019 for acute GI bleed secondary to gastric ulcer.  No inpatient work-up was done secondary to asymptomatic.  Seems no outpatient evaluation has been done.   Past Medical History:  Diagnosis Date   Abscess 02/2017   LEFT GLUTEAL    Acid reflux    Alcohol abuse    Anemia     CHF (congestive heart failure) (HCC)    CKD (chronic kidney disease) stage 4, GFR 15-29 ml/min ([place]) 04/11/2020   Cocaine abuse ([place])    DDD (degenerative disc disease), lumbar    Diabetes mellitus without complication ([place])    Dyspnea    Enlarged prostate    Hypertension    Spinal stenosis, lumbar     Past Surgical History:  Procedure Laterality Date   BACK SURGERY     cervical x2   BIOPSY  08/21/2018   Procedure: BIOPSY;  Surgeon: Ronald Lobo, MD;  Location: [place];  Service: Endoscopy;;   ESOPHAGOGASTRODUODENOSCOPY (EGD) WITH PROPOFOL N/A 08/21/2018   Procedure: ESOPHAGOGASTRODUODENOSCOPY (EGD) WITH PROPOFOL;  Surgeon: Ronald Lobo, MD;  Location: [place];  Service: Endoscopy;  Laterality: N/A;   INCISION AND DRAINAGE PERIRECTAL ABSCESS N/A 03/26/2017   Procedure: IRRIGATION AND DEBRIDEMENT PERIRECTAL ABSCESS;  Surgeon: Rolm Bookbinder, MD;  Location: [place];  Service: General;  Laterality: N/A;    Inpatient Medications: Scheduled Meds:  aspirin EC  81 mg Oral Daily   carvedilol  3.125 mg Oral BID WC   furosemide  40 mg Intravenous BID   heparin injection (subcutaneous)  5,000 Units Subcutaneous Q8H   levETIRAcetam  500 mg Oral BID   potassium chloride  10 mEq Oral BID   sacubitril-valsartan  1 tablet Oral BID   sodium chloride flush  3 mL Intravenous Q12H   tamsulosin  0.4 mg Oral QPC supper   Continuous Infusions:  sodium chloride     PRN Meds:  sodium chloride, acetaminophen, ondansetron (ZOFRAN) IV, sodium chloride flush  Allergies:   No Known Allergies  Social History:   Social History   Socioeconomic History   Marital status: Widowed    Spouse name: Not on file   Number of children: 0   Years of education: Not on file   Highest education level: High school graduate  Occupational History   Occupation: retired  Tobacco Use   Smoking status: Current Every Day Smoker    Packs/day: 0.25    Types: Cigarettes   Smokeless tobacco: Never Used   Vaping Use   Vaping Use: Never used  Substance and Sexual Activity   Alcohol use: Yes    Alcohol/week: 12.0 standard drinks    Types: 12 Cans of beer per week    Comment: i can of beer daily   Drug use: Yes    Types: Cocaine, Marijuana    Comment: cocaine last use Wednesday 07/20/2019   Sexual activity: Not on file  Other Topics Concern   Not on file  Social History Narrative   [place] veteran   Social Determinants of Health   Financial Resource Strain:    Difficulty of Paying Living Expenses:   Food Insecurity:    Worried About Charity fundraiser in the Last Year:    Arboriculturist in the Last Year:   Transportation Needs:    Film/video editor (Medical):    Lack of Transportation (Non-Medical):   Physical Activity:    Days of Exercise per Week:    Minutes of Exercise per Session:   Stress:    Feeling of Stress :   Social Connections:    Frequency of Communication with Friends and Family:    Frequency of Social Gatherings with Friends and Family:    Attends Religious Services:    Active Member of Clubs or Organizations:    Attends Music therapist:    Marital Status:   Intimate Partner Violence:    Fear of Current or Ex-Partner:    Emotionally Abused:    Physically Abused:    Sexually Abused:     Family History:   Family History  Problem Relation Age of Onset   Hypertension Mother    Cancer - Lung Father      ROS:  Please see the history of present illness.  All other ROS reviewed and negative.     Physical Exam/Data:   Vitals:   04/11/20 0530 04/11/20 0845 04/11/20 0900 04/11/20 0927  BP: (!) 156/94 (!) 158/97 (!) 152/89 (!) 147/84  Pulse: 71  86 70  Resp: (!) 22 16 (!) 21   Temp:      TempSrc:      SpO2: 98%  100%   Weight:      Height:        Intake/Output Summary (Last 24 hours) at 04/11/2020 1317 Last data filed at 04/11/2020 0902 Gross per 24 hour  Intake --  Output 3800 ml  Net -3800 ml   Last 3 Weights 04/10/2020  04/10/2020 01/27/2020  Weight (lbs) 200 lb 200 lb 208 lb  Weight (kg) 90.719 kg 90.719 kg 94.348 kg     Body mass index is 25.68 kg/m.  General:  Well nourished, well developed, in no acute distress HEENT: normal Lymph: no adenopathy Neck: no JVD Endocrine:  No thryomegaly Vascular: No carotid bruits; FA pulses 2+ bilaterally without bruits  Cardiac:  normal S1, S2; RRR; no murmur  Lungs:  clear to auscultation  bilaterally, no wheezing, rhonchi or rales  Abd: soft, nontender, no hepatomegaly  Ext: 1+ BL LE edema Musculoskeletal:  No deformities, BUE and BLE strength normal and equal Skin: warm and dry  Neuro:  CNs 2-12 intact, no focal abnormalities noted Psych:  Normal affect   EKG:  The EKG was personally reviewed and demonstrates:  NSR, LBBB Telemetry:  Telemetry was personally reviewed and demonstrates:  Sinus rhythm   Relevant CV Studies:  Pending echo  Laboratory Data:  High Sensitivity Troponin:   Recent Labs  Lab 04/10/20 2234 04/11/20 0027  TROPONINIHS 18* 14     Chemistry Recent Labs  Lab 04/10/20 1229 04/11/20 0805  NA 141 143  K 5.3* 4.7  CL 113* 110  CO2 19* 20*  GLUCOSE 96 86  BUN 46* 46*  CREATININE 2.61* 2.41*  CALCIUM 8.9 9.4  GFRNONAA 24* 26*  GFRAA 27* 30*  ANIONGAP 9 13    Recent Labs  Lab 04/11/20 0805  PROT 5.8*  ALBUMIN 3.2*  AST 18  ALT 15  ALKPHOS 54  BILITOT 0.7   Hematology Recent Labs  Lab 04/10/20 1229 04/11/20 0805  WBC 6.2 5.9  RBC 4.32 4.64  HGB 12.5* 13.3  HCT 39.2 41.9  MCV 90.7 90.3  MCH 28.9 28.7  MCHC 31.9 31.7  RDW 13.3 13.2  PLT 235 231   BNP Recent Labs  Lab 04/10/20 2234  BNP 1,366.8*    Radiology/Studies:  DG Chest 2 View  Result Date: 04/10/2020 CLINICAL DATA:  Shortness of breath EXAM: CHEST - 2 VIEW COMPARISON:  2018 FINDINGS: Mild chronic interstitial prominence with pulmonary vascular congestion. No pleural effusion or pneumothorax. Stable cardiomediastinal contours. No acute osseous  abnormality. IMPRESSION: Mild pulmonary vascular congestion. Electronically Signed   By: Macy Mis M.D.   On: 04/10/2020 12:53   {  Assessment and Plan:   Acute CHF exacerbation, Unknown type -Presented with 3 weeks history of progressive worsening lower extremity edema and sounds like shortness of breath with orthopnea.  He sleeps chronically on recliner/couch.  Admits to eating high salt ham every day.  He was noted to have left bundle branch block in 2019 but never had an ischemic evaluation.  This occurred in setting of alcohol cocaine abuse. - BNP 1366. CXR with vascular congestion -Agree with IV diuresis and follow renal function -Started on Coreg and Entresto by primary team - Change Entresto to BiDil given CKD  2.  Kidney disease stage IV -Renal function improved with IV diuresis -avoid nephrotoxic agent   3.  Hypertension -Home Maxide on hold -Continue Coreg - Change Entresto to BiDil  4.  Generalized weakness/seizure disorder -Per primary team  5.  Tobacco, alcohol and Cocaine abuse -Recommended cessation.  Seems poor insight  6. LBBB - Noted 07/2018 first time. No ischemic evaluation.  - Denies chest pain   For questions or updates, please contact [place] Please consult [email or web address] for contact info under    Jarrett Soho, [place]  04/11/2020 1:17 PM  Patient examined chart reviewed Discussed care with patient and PA Exam with patient in no distress. Poor dentition Basilar crackles Mild JVP elevation PMI palpable soft abdomen eating lunch. Plus 2 LE edema. Patient takes very poor care of himself. Discussed smoking cessation and drug use. He also had a very high salt diet and just ate an entire Ham Primary service started [facility] but no LV function known yet and Cr 2.6 would stop this and Rx BP/CHF with Bidil which is  also covered by Medicaid. Suspect he will need 48 hour stay and baseline Cr will be in high 2's Further recommendations pending  echo. ECG with LBBB suggests non ischemic DCM  Jenkins Rouge MD [facility]

## 2020-04-11 NOTE — ED Notes (Signed)
Please call the niece with an update [phone]

## 2020-04-11 NOTE — ED Notes (Signed)
Admitting at bedside 

## 2020-04-11 NOTE — ED Notes (Signed)
Pt states he is having increased discomfort while urinating starting now. No tenderness on palpation. PA Lattie Haw informed. Urine sample sent at this time.

## 2020-04-11 NOTE — ED Notes (Signed)
Just cleaned pt up from bowel movement. Everything was soiled, new condom cath reapplied and lining has been changed.

## 2020-04-11 NOTE — ED Notes (Signed)
Breakfast Ordered--Tim Bradley  

## 2020-04-11 NOTE — H&P (Signed)
History and Physical    [name] DOB: December 16, 1950 DOA: 04/10/2020  [facility] [place]  Patient coming from:  Home  Chief Complaint:   Swelling of legs, SOB with exertion, generalized weakness  HPI: Tim Bradley is a 69 y.o. male with medical history significant for hypertension, BPH, seizure disorder, history of type 2 diabetes mellitus that he states is in remission.  Tim Bradley presents by EMS with complaint of swelling in his legs with generalized weakness and a fall at home.  He reports over the last 2 weeks he has had increasing swelling of his legs which is made it hard for him to get around his house.  He lives alone.  He reports he had a fall yesterday afternoon and had generalized weakness some had difficulty getting up.  He was able to get to the phone to call EMS and was transported to the hospital.  States he has not had any chest pain or pressure.  Reports he did not have any head injury and he had no loss of consciousness.  He states he did not have any seizure activity.  He reports he has been compliant with his medications.  Dates he has had home health arranged through the [place] in [place] did not come out to start services yet.  He reports that he has been sleeping sitting up in his couch semiinclined position.  He states he has been doing this for the last month or 2.  He does report having feeling of being smothered if he lays flat.  He states he has never been diagnosed with congestive heart failure. Reports he has not had any fever, cough, upper respiratory symptoms, abdominal pain, nausea, vomiting, diarrhea.  Patient reports he has urinary hesitancy due to BPH but he has not had any dysuria.  He denies any flank pain.  Denies any history of kidney stones. He has a history of cocaine use with previous drug screens positive for cocaine but states he has not used recently.  He states he drinks 1 beer every other day.  States he smokes 1 pack of cigarettes a  week.  ED Course: In the emergency room patient was found to have bilateral edema of his legs and bilateral rales on auscultation of his lungs.  BNP is elevated.  Patient has chronic kidney disease stage IV which seems to be at baseline.  Patient has generalized weakness and was not able to stand up without the help of 2-3 staff members so he could not be safely discharged home.   Review of Systems:  General: Reports weakness.denies fever, chills, weight loss, night sweats.  Denies dizziness.  Denies change in appetite HENT: Denies head trauma, headache, denies change in hearing, tinnitus.  Denies nasal congestion or bleeding.  Denies sore throat, sores in mouth.  Denies difficulty swallowing Eyes: Denies blurry vision, pain in eye, drainage.  Denies discoloration of eyes. Neck: Denies pain.  Denies swelling.  Denies pain with movement. Cardiovascular: Denies chest pain, palpitations.  Reports lower extremity edema.  Works orthopnea Respiratory: Reports shortness of breath with exertion.  Denies cough.  Denies wheezing.  Denies sputum production Gastrointestinal: Denies abdominal pain, swelling.  Denies nausea, vomiting, diarrhea.  Denies melena.  Denies hematemesis. Musculoskeletal: Denies limitation of movement.  Denies deformity or swelling.  Denies pain.  Denies arthralgias or myalgias. Genitourinary: Reports chronic urinary hesitancy and weak stream.  Denies pelvic pain.  Denies urinary frequency.  Denies dysuria.  Skin: Denies  rash.  Denies petechiae, purpura, ecchymosis. Neurological: Denies headache.  Denies syncope.  Denies recent seizure activity.  Denies weakness or paresthesia.  Denies slurred speech, drooping face.  Denies visual change. Psychiatric: Denies depression, anxiety.  Denies suicidal thoughts or ideation.  Denies hallucinations.  Past Medical History:  Diagnosis Date   Abscess 02/2017   LEFT GLUTEAL    Acid reflux    Alcohol abuse    Anemia    CHF (congestive  heart failure) (HCC)    CKD (chronic kidney disease) stage 4, GFR 15-29 ml/min ([place]) 04/11/2020   Cocaine abuse ([place])    DDD (degenerative disc disease), lumbar    Diabetes mellitus without complication ([employer])    Dyspnea    Enlarged prostate    Hypertension    Spinal stenosis, lumbar     Past Surgical History:  Procedure Laterality Date   BACK SURGERY     cervical x2   BIOPSY  08/21/2018   Procedure: BIOPSY;  Surgeon: Ronald Lobo, MD;  Location: [facility];  Service: Endoscopy;;   ESOPHAGOGASTRODUODENOSCOPY (EGD) WITH PROPOFOL N/A 08/21/2018   Procedure: ESOPHAGOGASTRODUODENOSCOPY (EGD) WITH PROPOFOL;  Surgeon: Ronald Lobo, MD;  Location: [facility];  Service: Endoscopy;  Laterality: N/A;   INCISION AND DRAINAGE PERIRECTAL ABSCESS N/A 03/26/2017   Procedure: IRRIGATION AND DEBRIDEMENT PERIRECTAL ABSCESS;  Surgeon: Rolm Bookbinder, MD;  Location: [facility];  Service: General;  Laterality: N/A;    Social History  reports that he has been smoking cigarettes. He has been smoking about 0.25 packs per day. He has never used smokeless tobacco. He reports current alcohol use of about 12.0 standard drinks of alcohol per week. He reports current drug use. Drugs: Cocaine and Marijuana.  No Known Allergies  Family History  Problem Relation Age of Onset   Hypertension Mother    Cancer - Lung Father      Prior to Admission medications   Medication Sig Start Date End Date Taking? Authorizing Provider  levETIRAcetam (KEPPRA) 500 MG tablet Take 1 tablet (500 mg total) by mouth 2 (two) times daily. 03/19/20  Yes Corena Herter, PA-C  tamsulosin (FLOMAX) 0.4 MG CAPS capsule Take 1 capsule (0.4 mg total) by mouth daily after supper. 01/27/20  Yes Deno Etienne, DO  triamterene-hydrochlorothiazide (MAXZIDE) 75-50 MG per tablet Take 1 tablet by mouth daily.   Yes [provider]  dicyclomine (BENTYL) 20 MG tablet Take 1 tablet (20 mg total) by mouth 3 (three) times daily  before meals. Patient not taking: Reported on 03/19/2020 08/17/18   Orpah Greek, MD  docusate sodium (COLACE) 250 MG capsule Take 1 capsule (250 mg total) by mouth daily. Patient not taking: Reported on 03/19/2020 08/13/18   Charlann Lange, PA-C  furosemide (LASIX) 40 MG tablet Take 1 tablet (40 mg total) by mouth daily. 04/11/20   Larene Pickett, PA-C  pantoprazole (PROTONIX) 40 MG tablet Take 1 tablet (40 mg total) by mouth 2 (two) times daily before a meal. Patient not taking: Reported on 03/19/2020 09/12/18   Street, [place], PA-C  polyethylene glycol [facility]) packet Use one dose up to three times daily, maximum 3 consecutive days Patient not taking: Reported on 03/19/2020 08/13/18   Charlann Lange, PA-C  sucralfate (CARAFATE) 1 g tablet Take 1 tablet (1 g total) by mouth 4 (four) times daily. Patient not taking: Reported on 03/19/2020 08/22/18   Welford Roche, MD    Physical Exam: Vitals:   04/11/20 0015 04/11/20 0115 04/11/20 0215 04/11/20 0416  BP: (!) 178/90 Marland Kitchen)  143/81 (!) 149/91 (!) 157/90  Pulse: 88 71 73 68  Resp: 20 19 18 14   Temp:      TempSrc:      SpO2: 98% 98% 98% 97%  Weight:      Height:        Constitutional: NAD, calm, comfortable Vitals:   04/11/20 0015 04/11/20 0115 04/11/20 0215 04/11/20 0416  BP: (!) 178/90 (!) 143/81 (!) 149/91 (!) 157/90  Pulse: 88 71 73 68  Resp: 20 19 18 14   Temp:      TempSrc:      SpO2: 98% 98% 98% 97%  Weight:      Height:       General: WDWN, Alert and oriented x3.  Eyes: EOMI, PERRL, lids and conjunctivae normal.  Sclera nonicteric.  No nystagmus HENT:  [place]/AT, external ears normal.  Nares patent without epistasis.  Mucous membranes are moist. Posterior pharynx clear of any exudate or lesions. Normal dentition.  Neck: Soft, normal range of motion, supple, no masses, no thyromegaly.  Trachea midline Respiratory: Equal breath sounds but diminished.  Diffuse scattered rales.  No rhonchi.  No wheezing, no  crackles. Normal respiratory effort. No accessory muscle use.  Cardiovascular: Regular rate and rhythm, no murmurs / rubs / gallops.  Mild JVD noted.  Has 2+ lower extremity edema laterally. 1+ pedal pulses. No carotid bruits.  Abdomen: Soft, no tenderness, nondistended, no rebound or guarding.  No masses palpated. Bowel sounds normoactive Musculoskeletal: FROM.  Has clubbing of digits.  No cyanosis. No joint deformity upper and lower extremities. Normal muscle tone.  Skin: Warm, dry, intact no rashes, lesions, ulcers. No induration Neurologic: CN 2-12 grossly intact.  Normal speech.  Sensation intact, patella DTR +1 bilaterally. Strength 3/5 in lower extremities.  Strength 4-5 in upper extremities.  Moves all 4 extremities spontaneously. Psychiatric: Normal judgment and insight.  Normal mood.    Labs on Admission: I have personally reviewed following labs and imaging studies  CBC: Recent Labs  Lab 04/10/20 1229  WBC 6.2  HGB 12.5*  HCT 39.2  MCV 90.7  PLT 258    Basic Metabolic Panel: Recent Labs  Lab 04/10/20 1229  NA 141  K 5.3*  CL 113*  CO2 19*  GLUCOSE 96  BUN 46*  CREATININE 2.61*  CALCIUM 8.9    GFR: Estimated Creatinine Clearance: 30.2 mL/min (A) (by C-G formula based on SCr of 2.61 mg/dL (H)).  Liver Function Tests: No results for input(s): AST, ALT, ALKPHOS, BILITOT, PROT, ALBUMIN in the last 168 hours.  Urine analysis:    Component Value Date/Time   COLORURINE COLORLESS (A) 04/11/2020 0144   APPEARANCEUR CLEAR 04/11/2020 0144   LABSPEC 1.005 04/11/2020 0144   PHURINE 5.0 04/11/2020 0144   GLUCOSEU NEGATIVE 04/11/2020 0144   HGBUR SMALL (A) 04/11/2020 0144   BILIRUBINUR NEGATIVE 04/11/2020 0144   KETONESUR NEGATIVE 04/11/2020 0144   PROTEINUR 30 (A) 04/11/2020 0144   UROBILINOGEN 0.2 03/11/2012 2012   NITRITE NEGATIVE 04/11/2020 0144   LEUKOCYTESUR NEGATIVE 04/11/2020 0144    Radiological Exams on Admission: DG Chest 2 View  Result Date:  04/10/2020 CLINICAL DATA:  Shortness of breath EXAM: CHEST - 2 VIEW COMPARISON:  2018 FINDINGS: Mild chronic interstitial prominence with pulmonary vascular congestion. No pleural effusion or pneumothorax. Stable cardiomediastinal contours. No acute osseous abnormality. IMPRESSION: Mild pulmonary vascular congestion. Electronically Signed   By: Macy Mis M.D.   On: 04/10/2020 12:53    EKG: Independently reviewed.  EKG shows normal  sinus rhythm with right atrial enlargement and left bundle branch block.  QTc 464  Assessment/Plan Principal Problem:   Acute CHF (congestive heart failure) [facility]) Mr. Demicco be admitted to cardiac telemetry floor.  Serial troponins will be obtained to make sure no cardiac ischemia as etiology of CHF. Patient be diuresed with Lasix 40 mg IV twice daily for the next 6 hours.  Monitor INO's.  Monitor daily weight. Obtain echocardiogram to evaluate wall motion, valvular structures, EF Patient will be started on therapy for CHF with Coreg and Entresto.    Essential hypertension Continue home dose of Maxide.  Patient started on Coreg and Entresto for blood pressure and CHF.  Monitor blood pressure.    Leg edema [place] to CHF.  Being diuresed with Lasix    Dyspnea Secondary to CHF.  Incentive spirometer hour to 2 hours while awake.  Supplemental oxygen as needed to keep O2 sat between 92-96%    CKD (chronic kidney disease) stage 4, GFR 15-29 ml/min (HCC) Stable CKD stage IV. Recheck electrolytes renal function morning    Generalized weakness Consult physical therapy for evaluation in the morning.  Any home health needs will be identified and coordinated with home health    Seizure [facility]) Continue home dose of Keppra.  Seizure precautions    BPH (benign prostatic hyperplasia) Continue home dose of Flomax      Tobacco use Smoking cessation education to be provided for discharge   DVT prophylaxis: [place] score elevated.  Lovenox for DVT prophylaxis Code  Status:   Full code Family Communication:  Diagnosis and plan discussed with patient.  Patient verbalized understanding and agrees with plan.  Further recommendations to follow as clinical indicated  Disposition Plan:   Patient is from:  Home  Anticipated DC to:  Home  Anticipated DC date:  Anticipate greater than 2 midnight stay in the hospital to stabilize medical condition  Anticipated DC barriers: Patient will need home health services when he goes home.  He states he is already at home health arranged by the [place]       and will coordinate with them for any new services that are identified   Admission status:  Inpatient  Severity of Illness: The appropriate patient status for this patient is INPATIENT. Inpatient status is judged to be reasonable and necessary in order to provide the required intensity of service to ensure the patient's safety. The patient's presenting symptoms, physical exam findings, and initial radiographic and laboratory data in the context of their chronic comorbidities is felt to place them at high risk for further clinical deterioration. Furthermore, it is not anticipated that the patient will be medically stable for discharge from the hospital within 2 midnights of admission. The following factors support the patient status of inpatient.   " The patient's presenting symptoms include shortness of breath, leg edema, generalized weakness. " The worrisome physical exam findings include edema of legs.  JVD.  Bilateral rales lung fields. " The initial radiographic and laboratory data are worrisome because of elevated BNP.  Pulmonary vascular congestion on chest x-ray. " The chronic co-morbidities include hypertension, history of cocaine use, tobacco use.   * I certify that at the point of admission it is my clinical judgment that the patient will require inpatient hospital care spanning beyond 2 midnights from the point of admission due to high intensity of service, high risk  for further deterioration and high frequency of surveillance required.Yevonne Aline Jadalee Westcott MD [facility]  How to contact the [facility] Attending or Consulting provider [place] or covering provider during after hours [place], for this patient?   1. Check the care team in [facility] and look for a) attending/consulting [facility] provider listed and b) the [facility] team listed 2. Log into [email or web address] and use [place]'s universal password to access. If you do not have the password, please contact the hospital operator. 3. Locate the [facility] provider you are looking for under [facility] and page to a number that you can be directly reached. 4. If you still have difficulty reaching the provider, please page the [facility] (Director on Call) for the Hospitalists listed on amion for assistance.  04/11/2020, 5:21 AM

## 2020-04-11 NOTE — ED Notes (Signed)
Warm blanket was given

## 2020-04-11 NOTE — ED Notes (Signed)
Pt wishes to go home. Secretary requested to call [facility] for transport home. PA Lattie Haw to print discharge paperwork

## 2020-04-11 NOTE — ED Notes (Signed)
[facility] CANCELLED PER RN BAILEY--Tim Bradley

## 2020-04-11 NOTE — Progress Notes (Signed)
  Echocardiogram 2D Echocardiogram has been performed.  Tim Bradley 04/11/2020, 5:12 PM

## 2020-04-12 ENCOUNTER — Encounter (HOSPITAL_COMMUNITY): Payer: Self-pay | Admitting: Family Medicine

## 2020-04-12 ENCOUNTER — Inpatient Hospital Stay (HOSPITAL_COMMUNITY): Payer: No Typology Code available for payment source

## 2020-04-12 DIAGNOSIS — I5041 Acute combined systolic (congestive) and diastolic (congestive) heart failure: Secondary | ICD-10-CM

## 2020-04-12 LAB — COMPREHENSIVE METABOLIC PANEL
ALT: 18 U/L (ref 0–44)
AST: 19 U/L (ref 15–41)
Albumin: 3 g/dL — ABNORMAL LOW (ref 3.5–5.0)
Alkaline Phosphatase: 50 U/L (ref 38–126)
Anion gap: 12 (ref 5–15)
BUN: 49 mg/dL — ABNORMAL HIGH (ref 8–23)
CO2: 23 mmol/L (ref 22–32)
Calcium: 9.1 mg/dL (ref 8.9–10.3)
Chloride: 104 mmol/L (ref 98–111)
Creatinine, Ser: 2.64 mg/dL — ABNORMAL HIGH (ref 0.61–1.24)
GFR calc Af Amer: 27 mL/min — ABNORMAL LOW (ref >60)
GFR calc non Af Amer: 23 mL/min — ABNORMAL LOW (ref >60)
Glucose, Bld: 129 mg/dL — ABNORMAL HIGH (ref 70–99)
Potassium: 4.7 mmol/L (ref 3.5–5.1)
Sodium: 139 mmol/L (ref 135–145)
Total Bilirubin: 0.7 mg/dL (ref 0.3–1.2)
Total Protein: 6.1 g/dL — ABNORMAL LOW (ref 6.5–8.1)

## 2020-04-12 LAB — CBC WITH DIFFERENTIAL/PLATELET
Abs Immature Granulocytes: 0.03 10*3/uL (ref 0.00–0.07)
Basophils Absolute: 0 10*3/uL (ref 0.0–0.1)
Basophils Relative: 1 %
Eosinophils Absolute: 0.1 10*3/uL (ref 0.0–0.5)
Eosinophils Relative: 3 %
HCT: 42.2 % (ref 39.0–52.0)
Hemoglobin: 13.3 g/dL (ref 13.0–17.0)
Immature Granulocytes: 1 %
Lymphocytes Relative: 30 %
Lymphs Abs: 1.3 10*3/uL (ref 0.7–4.0)
MCH: 28.8 pg (ref 26.0–34.0)
MCHC: 31.5 g/dL (ref 30.0–36.0)
MCV: 91.3 fL (ref 80.0–100.0)
Monocytes Absolute: 0.5 10*3/uL (ref 0.1–1.0)
Monocytes Relative: 11 %
Neutro Abs: 2.4 10*3/uL (ref 1.7–7.7)
Neutrophils Relative %: 54 %
Platelets: 217 10*3/uL (ref 150–400)
RBC: 4.62 MIL/uL (ref 4.22–5.81)
RDW: 13.2 % (ref 11.5–15.5)
WBC: 4.4 10*3/uL (ref 4.0–10.5)
nRBC: 0 % (ref 0.0–0.2)

## 2020-04-12 LAB — MAGNESIUM: Magnesium: 1.8 mg/dL (ref 1.7–2.4)

## 2020-04-12 LAB — PHOSPHORUS: Phosphorus: 4.4 mg/dL (ref 2.5–4.6)

## 2020-04-12 NOTE — ED Notes (Signed)
Lunch Tray Ordered @ 1120. 

## 2020-04-12 NOTE — ED Notes (Signed)
Patient condom cath off and linen soiled. Patient cleaned up and new condom cath applied.

## 2020-04-12 NOTE — Progress Notes (Addendum)
Progress Note  Patient Name: Tim Bradley Date of Encounter: 04/12/2020  Primary Cardiologist: Dr. Johnsie Cancel, MD   Subjective   Doing well. No specific complaints including SOB, CP  Inpatient Medications    Scheduled Meds:  aspirin EC  81 mg Oral Daily   carvedilol  3.125 mg Oral BID WC   heparin injection (subcutaneous)  5,000 Units Subcutaneous Q8H   isosorbide-hydrALAZINE  1 tablet Oral TID   levETIRAcetam  500 mg Oral BID   potassium chloride  10 mEq Oral BID   sodium chloride flush  3 mL Intravenous Q12H   tamsulosin  0.4 mg Oral QPC supper   Continuous Infusions:  sodium chloride     PRN Meds: sodium chloride, acetaminophen, ondansetron (ZOFRAN) IV, sodium chloride flush   Vital Signs    Vitals:   04/12/20 0045 04/12/20 0526 04/12/20 0546 04/12/20 0730  BP: 106/81 129/88 129/80 133/87  Pulse:  65 72 61  Resp: 18 (!) 24 18 (!) 21  Temp:      TempSrc:      SpO2:  98% 99% 91%  Weight:      Height:        Intake/Output Summary (Last 24 hours) at 04/12/2020 0907 Last data filed at 04/12/2020 4196 Gross per 24 hour  Intake --  Output 1200 ml  Net -1200 ml   Filed Weights   04/10/20 1217 04/10/20 1227  Weight: 200 lb (90.7 kg) 200 lb (90.7 kg)    Physical Exam   General: Well developed, well nourished, NAD Neck: Negative for carotid bruits. No JVD Lungs:Clear to ausculation bilaterally. Breathing is unlabored. Cardiovascular: RRR with S1 S2. No murmurs Abdomen: Soft, non-tender, non-distended. No obvious abdominal masses. Extremities: 1+ BLE edema. Radial pulses 2+ bilaterally Neuro: Alert and oriented. No focal deficits. No facial asymmetry. MAE spontaneously. Psych: Responds to questions appropriately with normal affect.    Labs    Chemistry Recent Labs  Lab 04/10/20 1229 04/11/20 0805 04/12/20 0500  NA 141 143 139  K 5.3* 4.7 4.7  CL 113* 110 104  CO2 19* 20* 23  GLUCOSE 96 86 129*  BUN 46* 46* 49*  CREATININE 2.61* 2.41* 2.64*   CALCIUM 8.9 9.4 9.1  PROT  --  5.8* 6.1*  ALBUMIN  --  3.2* 3.0*  AST  --  18 19  ALT  --  15 18  ALKPHOS  --  54 50  BILITOT  --  0.7 0.7  GFRNONAA 24* 26* 23*  GFRAA 27* 30* 27*  ANIONGAP 9 13 12      Hematology Recent Labs  Lab 04/10/20 1229 04/11/20 0805 04/12/20 0500  WBC 6.2 5.9 4.4  RBC 4.32 4.64 4.62  HGB 12.5* 13.3 13.3  HCT 39.2 41.9 42.2  MCV 90.7 90.3 91.3  MCH 28.9 28.7 28.8  MCHC 31.9 31.7 31.5  RDW 13.3 13.2 13.2  PLT 235 231 217    Cardiac EnzymesNo results for input(s): TROPONINI in the last 168 hours. No results for input(s): TROPIPOC in the last 168 hours.   BNP Recent Labs  Lab 04/10/20 2234  BNP 1,366.8*     DDimer No results for input(s): DDIMER in the last 168 hours.   Radiology    DG Chest 2 View  Result Date: 04/10/2020 CLINICAL DATA:  Shortness of breath EXAM: CHEST - 2 VIEW COMPARISON:  2018 FINDINGS: Mild chronic interstitial prominence with pulmonary vascular congestion. No pleural effusion or pneumothorax. Stable cardiomediastinal contours. No acute osseous  abnormality. IMPRESSION: Mild pulmonary vascular congestion. Electronically Signed   By: Macy Mis M.D.   On: 04/10/2020 12:53   ECHOCARDIOGRAM COMPLETE  Result Date: 04/11/2020    ECHOCARDIOGRAM REPORT   Patient Name:   Tim Bradley Date of Exam: 04/11/2020 Medical Rec #:  [id number]     Height:       74.0 in Accession #:    [id number]    Weight:       200.0 lb Date of Birth:  1950-12-10     BSA:          2.173 m Patient Age:    69 years      BP:           156/94 mmHg Patient Gender: M             HR:           71 bpm. Exam Location:  Inpatient Procedure: 2D Echo, Cardiac Doppler and Color Doppler Indications:    Dyspnea  History:        Patient has no prior history of Echocardiogram examinations.                 COPD, Signs/Symptoms:Dyspnea; Risk Factors:Hypertension and                 Diabetes. CKD.  Sonographer:    Clayton Lefort RDCS (AE) Referring Phys: [id number] [place]  1. Left ventricular ejection fraction, by estimation, is <20%. The left ventricle has severely decreased function. The left ventricle demonstrates global hypokinesis. There is moderate concentric left ventricular hypertrophy. Left ventricular diastolic parameters are consistent with Grade I diastolic dysfunction (impaired relaxation).  2. Right ventricular systolic function is mildly reduced. The right ventricular size is normal. There is normal pulmonary artery systolic pressure.  3. Left atrial size was mildly dilated.  4. The mitral valve is normal in structure. Trivial mitral valve regurgitation. No evidence of mitral stenosis.  5. The aortic valve is tricuspid. Aortic valve regurgitation is not visualized. No aortic stenosis is present.  6. Aortic dilatation noted. Aneurysm of the ascending aorta, measuring 41 mm. There is mild dilatation at the level of the sinuses of Valsalva measuring 37 mm.  7. The inferior vena cava is dilated in size with >50% respiratory variability, suggesting right atrial pressure of 8 mmHg. FINDINGS  Left Ventricle: Left ventricular ejection fraction, by estimation, is <20%. The left ventricle has severely decreased function. The left ventricle demonstrates global hypokinesis. The left ventricular internal cavity size was normal in size. There is moderate concentric left ventricular hypertrophy. Left ventricular diastolic parameters are consistent with Grade I diastolic dysfunction (impaired relaxation). Right Ventricle: The right ventricular size is normal. No increase in right ventricular wall thickness. Right ventricular systolic function is mildly reduced. There is normal pulmonary artery systolic pressure. The tricuspid regurgitant velocity is 2.35 m/s, and with an assumed right atrial pressure of 8 mmHg, the estimated right ventricular systolic pressure is 82.5 mmHg. Left Atrium: Left atrial size was mildly dilated. Right Atrium: Right atrial size was  normal in size. Pericardium: There is no evidence of pericardial effusion. Mitral Valve: The mitral valve is normal in structure. Normal mobility of the mitral valve leaflets. Trivial mitral valve regurgitation. No evidence of mitral valve stenosis. Tricuspid Valve: The tricuspid valve is normal in structure. Tricuspid valve regurgitation is trivial. No evidence of tricuspid stenosis. Aortic Valve: The aortic valve is tricuspid. Aortic valve regurgitation is not visualized. No aortic  stenosis is present. Aortic valve mean gradient measures 2.0 mmHg. Aortic valve peak gradient measures 3.4 mmHg. Aortic valve area, by VTI measures 2.39 cm. Pulmonic Valve: The pulmonic valve was normal in structure. Pulmonic valve regurgitation is not visualized. No evidence of pulmonic stenosis. Aorta: Aortic dilatation noted. There is mild dilatation at the level of the sinuses of Valsalva measuring 37 mm. There is an aneurysm involving the ascending aorta. The aneurysm measures 41 mm. Venous: The inferior vena cava is dilated in size with greater than 50% respiratory variability, suggesting right atrial pressure of 8 mmHg. IAS/Shunts: No atrial level shunt detected by color flow Doppler.  LEFT VENTRICLE PLAX 2D LVIDd:         4.60 cm LVIDs:         4.20 cm LV PW:         1.29 cm LV IVS:        1.46 cm LVOT diam:     2.00 cm LV SV:         38 LV SV Index:   17 LVOT Area:     3.14 cm  RIGHT VENTRICLE             IVC RV Basal diam:  3.20 cm     IVC diam: 2.20 cm RV S prime:     13.70 cm/s TAPSE (M-mode): 2.1 cm LEFT ATRIUM           Index       RIGHT ATRIUM           Index LA diam:      4.20 cm 1.93 cm/m  RA Area:     15.80 cm LA Vol (A4C): 71.8 ml 33.03 ml/m RA Volume:   38.90 ml  17.90 ml/m  AORTIC VALVE AV Area (Vmax):    2.62 cm AV Area (Vmean):   2.61 cm AV Area (VTI):     2.39 cm AV Vmax:           91.90 cm/s AV Vmean:          61.000 cm/s AV VTI:            0.158 m AV Peak Grad:      3.4 mmHg AV Mean Grad:      2.0 mmHg  LVOT Vmax:         76.60 cm/s LVOT Vmean:        50.700 cm/s LVOT VTI:          0.120 m LVOT/AV VTI ratio: 0.76  AORTA Ao Root diam: 3.70 cm Ao Asc diam:  4.10 cm TRICUSPID VALVE TR Peak grad:   22.1 mmHg TR Vmax:        235.00 cm/s  SHUNTS Systemic VTI:  0.12 m Systemic Diam: 2.00 cm Skeet Latch MD Electronically signed by Skeet Latch MD Signature Date/Time: 04/11/2020/6:29:12 PM    Final     Telemetry    04/12/20 NSR- Personally Reviewed  ECG    No new tracing as of 04/12/20- Personally Reviewed  Cardiac Studies   Echocardiogram 04/11/2020:   1. Left ventricular ejection fraction, by estimation, is <20%. The left  ventricle has severely decreased function. The left ventricle demonstrates  global hypokinesis. There is moderate concentric left ventricular  hypertrophy. Left ventricular diastolic  parameters are consistent with Grade I diastolic dysfunction (impaired  relaxation).   2. Right ventricular systolic function is mildly reduced. The right  ventricular size is normal. There is normal pulmonary artery systolic  pressure.  3. Left atrial size was mildly dilated.   4. The mitral valve is normal in structure. Trivial mitral valve  regurgitation. No evidence of mitral stenosis.   5. The aortic valve is tricuspid. Aortic valve regurgitation is not  visualized. No aortic stenosis is present.   6. Aortic dilatation noted. Aneurysm of the ascending aorta, measuring 41  mm. There is mild dilatation at the level of the sinuses of Valsalva  measuring 37 mm.   7. The inferior vena cava is dilated in size with >50% respiratory  variability, suggesting right atrial pressure of 8 mmHg.   Patient Profile     68 y.o. male  with a hx of hypertension, chronic kidney disease stage IV, seizure disorder, cocaine abuse, diabetes mellitus, tobacco smoking and alcohol abuse who is being seen today for the evaluation of CHF at the request of Dr. Alfredia Ferguson.   Assessment & Plan   1. Acute  systolic CHF exacerbation; LVEF <20%: -Patient presented with a 3-week history of progressive lower extremity edema, shortness of breath and orthopnea symptoms  -BNP 1366 with a CXR with vascular congestion -Started on Coreg and Entresto by primary team -Entresto was changed to BiDil given CKD -Unfortunately, echocardiogram from 04/11/2020 shows an LVEF at <20% with global hypokinesis and moderate concentric LVH, G1 DD and an aortic aneurysm measuring 41 mm below disease -Needs optimal GDMT with plan for repeat echocardiogram in 3 months.  If LV with no improvement, consider further ischemic work-up at that time. -May need additional dose of Lasix however would be cautious given creatiine rise overnight  -I&O, net of 5 L -Weight, 200lb -Continue ASA, carvedilol, Bidil   2. CKD Stage IV: -Creatinine, 2.64, up from 2.41 yesterday -Baseline appears to be 2.0 range  3.  Hypertension: -Stable, 129/80>129/88>106/81 -Continue Coreg -Change BiDil   4. Generalized weakness/seizure disorder: -Per primary team   5. Polysubstance abuse:  -Cessation strongly encouraged    6. LBBB: -Noted 07/2018 first time. No ischemic evaluation.  -Denies chest pain   7.  AAA: -Noted on echocardiogram from 04/11/2020 with ascending aortic dilation measuring 41 mm -Continue with aggressive BP control    Signed, Kathyrn Drown NP-C [facility] Pager: [phone] 04/12/2020, 9:07 AM     For questions or updates, please contact   Please consult [email or web address] for contact info under Cardiology/STEMI.  Patient examined chart reviewed Discussed care with NP. Denies dyspnea edema improved exam with clear lungs, no murmur increased PMI and LE edema  He will need f/u in CHF clinic on d/c. I changed entresto to bidil due to cost and renal failure yesterday. Good diuresis with lasix 2200 cc Would change to PO lasix in am  And follow Cr should likely have f/u with nephrology as well. Counseled on drug cessation    Jenkins Rouge MD [facility]

## 2020-04-12 NOTE — ED Notes (Signed)
Report given to Christy RN

## 2020-04-12 NOTE — ED Notes (Signed)
Breakfast Ordered--Tim Bradley  

## 2020-04-12 NOTE — Progress Notes (Signed)
PT Cancellation Note  Patient Details Name: Tim Bradley MRN: [id number] DOB: 1951-02-06   Cancelled Treatment:    Reason Eval/Treat Not Completed: Other (comment).  In transition to the floor, pt unavailable.   Ramond Dial 04/12/2020, 3:53 PM   Mee Hives, PT MS Acute Rehab Dept. Number: [place] [facility] [place]

## 2020-04-12 NOTE — Progress Notes (Signed)
PROGRESS NOTE    Tim Bradley  BHA:[id number] DOB: [birth year] DOA: 04/10/2020 PCP: System, Pcp Not In   Brief Narrative:  The patient is a 69 year old African-American male with a past medical history difficult for but not limited to, hypertension, CKD stage IV, BPH, seizure disorder, history of type 2 diabetes mellitus as well as other comorbidities who presents with a chief complaint of leg swelling with generalized weakness as well as a fall at home.  Will last 2 weeks he reports increased leg swelling which has made it hard for him to get around the house.  He currently lives alone son reports a fall yesterday and complains of generalized weakness and had some difficulty getting up.  He is able to get to the phone to call EMS and transfer to the hospital.  Denies any chest pain, loss of consciousness or seizure activity and feels he has been compliant with his medications.  Has had home health arranged through the [facility] in [place] but they have not come out to initiate yet.  He states that he does feel smothered when he lays flat he states that he has never been diagnosed with congestive heart failure.  He reports urinary hesitancy due to his BPH and has not had any dysuria.  He has a history of cocaine use with previous drug screens positive for cocaine but states that none recently.  He drinks beer every other day and smokes 1 pack of cigarettes a week.  In the ED he was found to have bilateral leg edema and the admitting physician noted bilateral rales on auscultation of his lungs.  His BNP is elevated and he does have chronic kidney disease stage IV and also generalized weakness and not able to stand without the help of 2-3 staff members.    **Interim History Cardiology was consulted and patient underwent an echocardiogram which showed a left ventricular ejection fraction less than 20% with severely decreased function and grade 1 diastolic dysfunction.  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   Assessment & Plan:   Principal Problem:   Acute CHF (congestive heart failure) (HCC) Active Problems:   Seizure (HCC)   Essential hypertension   Leg edema   Dyspnea   BPH (benign prostatic hyperplasia)   CKD (chronic kidney disease) stage 4, GFR 15-29 ml/min (HCC)   Generalized weakness  Acute Systolic and Grade 1 Diastolic CHF with EF of <97% -Tim Bradley is to be admitted to cardiac telemetry floor.  -BNP was 1366.8  -Serialtroponins will be obtained to make sure no cardiac ischemia as etiology of CHF. Initial Troponin I was 18 and repeat was 14 -Patient be diuresed with Lasix 40 mg IV twice daily yesterday and received IV 40 mg this morning and cardiology recommends change to p.o. Lasix in the a.m. -Strict I's and O's. Monitor daily weight. -Obtain echocardiogram to evaluate wall motion, valvular structures, EF; echocardiogram showed left ventricular ejection fraction less than 20% with severely reduced function global hypokinesis as well as moderate concentric LVH and grade 1 diastolic dysfunction -Patient will be started on therapy for CHF with Coreg and Entresto but he was changed to BiDil at 1 tab p.o. 3 times daily -Check UDS given Hx of Cocaine Use and he again was positive; Was started on Coreg so need to be careful and currently is on 3.125 mg p.o. twice daily -Cardiology feels the need to optimize goal-directed medical therapy  and there is a plan for repeat echocardiogram in 3 months and if the LV function has no improvement then can consider further ischemic work-up at that time -Patient is -5 L since admission and had 2,200 out yesterday; weight not be done in the last weight was taken at 200 pounds -Continue strict I's and O's and daily weights -Currently they recommend cautious diuresis and given a dose of IV Lasix this morning -Neurology recommends continuing aspirin, carvedilol, BiDil for  now -Continue to monitor for signs and symptoms of volume overload and repeat chest x-ray in a.m. -Chest x-ray this morning done and pending official read however I looked at the x-ray and it seems to be a little improved  Essential Hypertension -Will stop home dose of Maxide given that he is going to be diuresed.  -Patient was started on Coreg and Entresto for blood pressure and CHF and will defer to Cardiology to change/adjust.  Stopped his Nadara Mode start the patient on BiDil 1 tab p.o. 3 times daily -Continue to Monitor blood pressure per protocol  Leg Edema -Likely 2/2 to CHF.  -Being diuresed with Lasix -May consider obtaining LE venous Duplex to rule out DVT but his lower extremity swelling is improving  Dyspnea, improving -Secondary to CHF.  -C/wIncentive spirometer hour to 2 hours while awake.  -SpO2: 91 % -C/w Supplemental oxygen as needed to keep O2 sat between 92-96%  CKD (chronic kidney disease) stage 4, GFR 15-29 ml/min (HCC) Metabolic acidosis -Stable CKD stage IV. -BUN/Cr went from 46/2.61 -> 46/2.41 -> 49/2.64 -We will hold his Maxide given that he has been started on Coreg and Entresto but may need to be changed given his renal dysfunction  -Patient CO2 is 20, anion gap is 13, chloride level is 110 yesterday; today his CO2 is 23, anion gap is 12, chloride level is 104 -Avoid nephrotoxic medications, contrast dyes, hypotension and renally dose medications  -Repeat CMP in a.m. -We will need nephrology consultation in outpatient setting  Generalized Weakness -PT/OT to evaluate and Treat -Any home health needs will be identified and coordinated with home health  Seizure Disorder ([place]) -Continue home dose of Levetiracetam 500 mg po BID.  -C/w Seizure precautions  BPH (benign prostatic hyperplasia) -Continue home dose of Tamsulosin 0.4 mg qHS  Tobacco use and polysubstance abuse -Smoking cessation education to be provided prior to  discharge -Patient tested positive for cocaine and recommend cessation  AAA   -Cardiology recommends aggressive blood pressure control  -Had an echocardiogram done which showed an a sending aortic dilatation measuring 41 mm and 37 mm with Valsalva  DVT prophylaxis: Heparin 5,000 units sq q8h Code Status: FULL CODE  Family Communication: No family present at bedside  Disposition Plan: Pending PT evaluation and further evaluation by cardiology clearance by them  Status is: Inpatient  Remains inpatient appropriate because:Ongoing diagnostic testing needed not appropriate for outpatient work up, IV treatments appropriate due to intensity of illness or inability to take PO and Inpatient level of care appropriate due to severity of illness   Dispo: The patient is from: Home              Anticipated d/c is to: TBD              Anticipated d/c date is: 1 day              Patient currently is not medically stable to d/c.  Consultants:   Cardiology   Procedures:  ECHOCARDIOGRAM IMPRESSIONS  1. Left ventricular ejection fraction, by estimation, is <20%. The left  ventricle has severely decreased function. The left ventricle demonstrates  global hypokinesis. There is moderate concentric left ventricular  hypertrophy. Left ventricular diastolic  parameters are consistent with Grade I diastolic dysfunction (impaired  relaxation).  2. Right ventricular systolic function is mildly reduced. The right  ventricular size is normal. There is normal pulmonary artery systolic  pressure.  3. Left atrial size was mildly dilated.  4. The mitral valve is normal in structure. Trivial mitral valve  regurgitation. No evidence of mitral stenosis.  5. The aortic valve is tricuspid. Aortic valve regurgitation is not  visualized. No aortic stenosis is present.  6. Aortic dilatation noted. Aneurysm of the ascending aorta, measuring 41  mm. There is mild dilatation at the level of the sinuses of  Valsalva  measuring 37 mm.  7. The inferior vena cava is dilated in size with >50% respiratory  variability, suggesting right atrial pressure of 8 mmHg.   FINDINGS  Left Ventricle: Left ventricular ejection fraction, by estimation, is  <20%. The left ventricle has severely decreased function. The left  ventricle demonstrates global hypokinesis. The left ventricular internal  cavity size was normal in size. There is  moderate concentric left ventricular hypertrophy. Left ventricular  diastolic parameters are consistent with Grade I diastolic dysfunction  (impaired relaxation).   Right Ventricle: The right ventricular size is normal. No increase in  right ventricular wall thickness. Right ventricular systolic function is  mildly reduced. There is normal pulmonary artery systolic pressure. The  tricuspid regurgitant velocity is 2.35  m/s, and with an assumed right atrial pressure of 8 mmHg, the estimated  right ventricular systolic pressure is 43.1 mmHg.   Left Atrium: Left atrial size was mildly dilated.   Right Atrium: Right atrial size was normal in size.   Pericardium: There is no evidence of pericardial effusion.   Mitral Valve: The mitral valve is normal in structure. Normal mobility of  the mitral valve leaflets. Trivial mitral valve regurgitation. No evidence  of mitral valve stenosis.   Tricuspid Valve: The tricuspid valve is normal in structure. Tricuspid  valve regurgitation is trivial. No evidence of tricuspid stenosis.   Aortic Valve: The aortic valve is tricuspid. Aortic valve regurgitation is  not visualized. No aortic stenosis is present. Aortic valve mean gradient  measures 2.0 mmHg. Aortic valve peak gradient measures 3.4 mmHg. Aortic  valve area, by VTI measures 2.39  cm.   Pulmonic Valve: The pulmonic valve was normal in structure. Pulmonic valve  regurgitation is not visualized. No evidence of pulmonic stenosis.   Aorta: Aortic dilatation noted. There  is mild dilatation at the level of  the sinuses of Valsalva measuring 37 mm. There is an aneurysm involving  the ascending aorta. The aneurysm measures 41 mm.   Venous: The inferior vena cava is dilated in size with greater than 50%  respiratory variability, suggesting right atrial pressure of 8 mmHg.   IAS/Shunts: No atrial level shunt detected by color flow Doppler.     LEFT VENTRICLE  PLAX 2D  LVIDd:     4.60 cm  LVIDs:     4.20 cm  LV PW:     1.29 cm  LV IVS:    1.46 cm  LVOT diam:   2.00 cm  LV SV:     38  LV SV Index:  17  LVOT Area:   3.14 cm     RIGHT VENTRICLE  IVC  RV Basal diam: 3.20 cm   IVC diam: 2.20 cm  RV S prime:   13.70 cm/s  TAPSE (M-mode): 2.1 cm   LEFT ATRIUM      Index    RIGHT ATRIUM      Index  LA diam:   4.20 cm 1.93 cm/m RA Area:   15.80 cm  LA Vol (A4C): 71.8 ml 33.03 ml/m RA Volume:  38.90 ml 17.90 ml/m  AORTIC VALVE  AV Area (Vmax):  2.62 cm  AV Area (Vmean):  2.61 cm  AV Area (VTI):   2.39 cm  AV Vmax:      91.90 cm/s  AV Vmean:     61.000 cm/s  AV VTI:      0.158 m  AV Peak Grad:   3.4 mmHg  AV Mean Grad:   2.0 mmHg  LVOT Vmax:     76.60 cm/s  LVOT Vmean:    50.700 cm/s  LVOT VTI:     0.120 m  LVOT/AV VTI ratio: 0.76    AORTA  Ao Root diam: 3.70 cm  Ao Asc diam: 4.10 cm   TRICUSPID VALVE  TR Peak grad:  22.1 mmHg  TR Vmax:    235.00 cm/s    SHUNTS  Systemic VTI: 0.12 m  Systemic Diam: 2.00 cm   Antimicrobials:  Anti-infectives (From admission, onward)   None     Subjective: Seen and examined at bedside states that he is no longer short of breath.  Wanting to go home.  Denies chest pain, lightheadedness or dizziness.  No other concerns or complaints at this time. He still has some leg swelling but this is improved.  Objective: Vitals:   04/12/20 0045 04/12/20 0526 04/12/20 0546 04/12/20 0730  BP:  106/81 129/88 129/80 133/87  Pulse:  65 72 61  Resp: 18 (!) 24 18 (!) 21  Temp:      TempSrc:      SpO2:  98% 99% 91%  Weight:      Height:        Intake/Output Summary (Last 24 hours) at 04/12/2020 1308 Last data filed at 04/12/2020 5053 Gross per 24 hour  Intake --  Output 1200 ml  Net -1200 ml   Filed Weights   04/10/20 1217 04/10/20 1227  Weight: 90.7 kg 90.7 kg   Examination: Physical Exam:  Constitutional: WN/WD slightly overweight African-American male currently in NAD and appears calm and mildly uncomfortable Eyes: Lids and conjunctivae normal, sclerae anicteric  ENMT: External Ears, Nose appear normal. Grossly normal hearing. Neck: Appears normal, supple, no cervical masses, normal ROM, no appreciable thyromegaly; no JVD Respiratory: Diminished to auscultation bilaterally, no wheezing, rales, rhonchi or crackles. Normal respiratory effort and patient is not tachypenic. No accessory muscle use.  Unlabored breathing Cardiovascular: RRR, no murmurs / rubs / gallops. S1 and S2 auscultated.  1+ lower extremity edema bilaterally Abdomen: Soft, non-tender, slightly distended. Bowel sounds positive.  GU: Deferred. Musculoskeletal: No clubbing / cyanosis of digits/nails. No joint deformity upper and lower extremities.  Skin: No rashes, lesions, ulcers on limited skin evaluation. No induration; Warm and dry.  Neurologic: CN 2-12 grossly intact with no focal deficits. Romberg sign and cerebellar reflexes not assessed.  Psychiatric: Normal judgment and insight. Alert and oriented x 3.  Mildly anxious mood and appropriate affect.   Data Reviewed: I have personally reviewed following labs and imaging studies  CBC: Recent Labs  Lab 04/10/20 1229 04/11/20 0805 04/12/20 0500  WBC 6.2 5.9  4.4  NEUTROABS  --  3.9 2.4  HGB 12.5* 13.3 13.3  HCT 39.2 41.9 42.2  MCV 90.7 90.3 91.3  PLT 235 231 448   Basic Metabolic Panel: Recent Labs  Lab 04/10/20 1229 04/11/20 0805  04/12/20 0500  NA 141 143 139  K 5.3* 4.7 4.7  CL 113* 110 104  CO2 19* 20* 23  GLUCOSE 96 86 129*  BUN 46* 46* 49*  CREATININE 2.61* 2.41* 2.64*  CALCIUM 8.9 9.4 9.1  MG  --  2.0 1.8  PHOS  --  4.1 4.4   GFR: Estimated Creatinine Clearance: 29.8 mL/min (A) (by C-G formula based on SCr of 2.64 mg/dL (H)). Liver Function Tests: Recent Labs  Lab 04/11/20 0805 04/12/20 0500  AST 18 19  ALT 15 18  ALKPHOS 54 50  BILITOT 0.7 0.7  PROT 5.8* 6.1*  ALBUMIN 3.2* 3.0*   No results for input(s): LIPASE, AMYLASE in the last 168 hours. No results for input(s): AMMONIA in the last 168 hours. Coagulation Profile: No results for input(s): INR, PROTIME in the last 168 hours. Cardiac Enzymes: No results for input(s): CKTOTAL, CKMB, CKMBINDEX, TROPONINI in the last 168 hours. BNP (last 3 results) No results for input(s): PROBNP in the last 8760 hours. HbA1C: No results for input(s): HGBA1C in the last 72 hours. CBG: No results for input(s): GLUCAP in the last 168 hours. Lipid Profile: No results for input(s): CHOL, HDL, LDLCALC, TRIG, CHOLHDL, LDLDIRECT in the last 72 hours. Thyroid Function Tests: Recent Labs    04/11/20 0805  TSH 1.213   Anemia Panel: No results for input(s): VITAMINB12, FOLATE, FERRITIN, TIBC, IRON, RETICCTPCT in the last 72 hours. Sepsis Labs: No results for input(s): PROCALCITON, LATICACIDVEN in the last 168 hours.  Recent Results (from the past 240 hour(s))  SARS Coronavirus 2 by RT PCR (hospital order, performed in [facility] hospital lab) Nasopharyngeal Nasopharyngeal Swab     Status: None   Collection Time: 04/11/20  6:15 AM   Specimen: Nasopharyngeal Swab  Result Value Ref Range Status   SARS Coronavirus 2 NEGATIVE NEGATIVE Final    Comment: (NOTE) SARS-CoV-2 target nucleic acids are NOT DETECTED.  The SARS-CoV-2 RNA is generally detectable in upper and lower respiratory specimens during the acute phase of infection. The lowest concentration of  SARS-CoV-2 viral copies this assay can detect is 250 copies / mL. A negative result does not preclude SARS-CoV-2 infection and should not be used as the sole basis for treatment or other patient management decisions.  A negative result may occur with improper specimen collection / handling, submission of specimen other than nasopharyngeal swab, presence of viral mutation(s) within the areas targeted by this assay, and inadequate number of viral copies (<250 copies / mL). A negative result must be combined with clinical observations, patient history, and epidemiological information.  Fact Sheet for Patients:   StrictlyIdeas.no  Fact Sheet for Healthcare Providers: BankingDealers.co.za  This test is not yet approved or  cleared by the [place] FDA and has been authorized for detection and/or diagnosis of SARS-CoV-2 by FDA under an Emergency Use Authorization (EUA).  This EUA will remain in effect (meaning this test can be used) for the duration of the COVID-19 declaration under Section 564(b)(1) of the Act, 21 U.S.C. section 360bbb-3(b)(1), unless the authorization is terminated or revoked sooner.  Performed at [facility], [place] [address] [place], [place] [address]      RN Pressure Injury Documentation:     Estimated  body mass index is 25.68 kg/m as calculated from the following:   Height as of this encounter: 6\' 2"  (1.88 m).   Weight as of this encounter: 90.7 kg.  Malnutrition Type:      Malnutrition Characteristics:      Nutrition Interventions:    Radiology Studies: ECHOCARDIOGRAM COMPLETE  Result Date: 04/11/2020    ECHOCARDIOGRAM REPORT   Patient Name:   ALLARD LIGHTSEY Heimsoth Date of Exam: 04/11/2020 Medical Rec #:  [id number]     Height:       74.0 in Accession #:    [id number]    Weight:       200.0 lb Date of Birth:  [birth year]     BSA:          2.173 m Patient Age:    3 years      BP:           156/94 mmHg  Patient Gender: M             HR:           71 bpm. Exam Location:  Inpatient Procedure: 2D Echo, Cardiac Doppler and Color Doppler Indications:    Dyspnea  History:        Patient has no prior history of Echocardiogram examinations.                 COPD, Signs/Symptoms:Dyspnea; Risk Factors:Hypertension and                 Diabetes. CKD.  Sonographer:    Clayton Lefort RDCS (AE) Referring Phys: [id number] [place]  1. Left ventricular ejection fraction, by estimation, is <20%. The left ventricle has severely decreased function. The left ventricle demonstrates global hypokinesis. There is moderate concentric left ventricular hypertrophy. Left ventricular diastolic parameters are consistent with Grade I diastolic dysfunction (impaired relaxation).  2. Right ventricular systolic function is mildly reduced. The right ventricular size is normal. There is normal pulmonary artery systolic pressure.  3. Left atrial size was mildly dilated.  4. The mitral valve is normal in structure. Trivial mitral valve regurgitation. No evidence of mitral stenosis.  5. The aortic valve is tricuspid. Aortic valve regurgitation is not visualized. No aortic stenosis is present.  6. Aortic dilatation noted. Aneurysm of the ascending aorta, measuring 41 mm. There is mild dilatation at the level of the sinuses of Valsalva measuring 37 mm.  7. The inferior vena cava is dilated in size with >50% respiratory variability, suggesting right atrial pressure of 8 mmHg. FINDINGS  Left Ventricle: Left ventricular ejection fraction, by estimation, is <20%. The left ventricle has severely decreased function. The left ventricle demonstrates global hypokinesis. The left ventricular internal cavity size was normal in size. There is moderate concentric left ventricular hypertrophy. Left ventricular diastolic parameters are consistent with Grade I diastolic dysfunction (impaired relaxation). Right Ventricle: The right ventricular size is normal.  No increase in right ventricular wall thickness. Right ventricular systolic function is mildly reduced. There is normal pulmonary artery systolic pressure. The tricuspid regurgitant velocity is 2.35 m/s, and with an assumed right atrial pressure of 8 mmHg, the estimated right ventricular systolic pressure is 22.9 mmHg. Left Atrium: Left atrial size was mildly dilated. Right Atrium: Right atrial size was normal in size. Pericardium: There is no evidence of pericardial effusion. Mitral Valve: The mitral valve is normal in structure. Normal mobility of the mitral valve leaflets. Trivial mitral valve regurgitation. No evidence of mitral valve stenosis. Tricuspid Valve: The  tricuspid valve is normal in structure. Tricuspid valve regurgitation is trivial. No evidence of tricuspid stenosis. Aortic Valve: The aortic valve is tricuspid. Aortic valve regurgitation is not visualized. No aortic stenosis is present. Aortic valve mean gradient measures 2.0 mmHg. Aortic valve peak gradient measures 3.4 mmHg. Aortic valve area, by VTI measures 2.39 cm. Pulmonic Valve: The pulmonic valve was normal in structure. Pulmonic valve regurgitation is not visualized. No evidence of pulmonic stenosis. Aorta: Aortic dilatation noted. There is mild dilatation at the level of the sinuses of Valsalva measuring 37 mm. There is an aneurysm involving the ascending aorta. The aneurysm measures 41 mm. Venous: The inferior vena cava is dilated in size with greater than 50% respiratory variability, suggesting right atrial pressure of 8 mmHg. IAS/Shunts: No atrial level shunt detected by color flow Doppler.  LEFT VENTRICLE PLAX 2D LVIDd:         4.60 cm LVIDs:         4.20 cm LV PW:         1.29 cm LV IVS:        1.46 cm LVOT diam:     2.00 cm LV SV:         38 LV SV Index:   17 LVOT Area:     3.14 cm  RIGHT VENTRICLE             IVC RV Basal diam:  3.20 cm     IVC diam: 2.20 cm RV S prime:     13.70 cm/s TAPSE (M-mode): 2.1 cm LEFT ATRIUM            Index       RIGHT ATRIUM           Index LA diam:      4.20 cm 1.93 cm/m  RA Area:     15.80 cm LA Vol (A4C): 71.8 ml 33.03 ml/m RA Volume:   38.90 ml  17.90 ml/m  AORTIC VALVE AV Area (Vmax):    2.62 cm AV Area (Vmean):   2.61 cm AV Area (VTI):     2.39 cm AV Vmax:           91.90 cm/s AV Vmean:          61.000 cm/s AV VTI:            0.158 m AV Peak Grad:      3.4 mmHg AV Mean Grad:      2.0 mmHg LVOT Vmax:         76.60 cm/s LVOT Vmean:        50.700 cm/s LVOT VTI:          0.120 m LVOT/AV VTI ratio: 0.76  AORTA Ao Root diam: 3.70 cm Ao Asc diam:  4.10 cm TRICUSPID VALVE TR Peak grad:   22.1 mmHg TR Vmax:        235.00 cm/s  SHUNTS Systemic VTI:  0.12 m Systemic Diam: 2.00 cm Skeet Latch MD Electronically signed by Skeet Latch MD Signature Date/Time: 04/11/2020/6:29:12 PM    Final    Scheduled Meds: . aspirin EC  81 mg Oral Daily  . carvedilol  3.125 mg Oral BID WC  . heparin injection (subcutaneous)  5,000 Units Subcutaneous Q8H  . isosorbide-hydrALAZINE  1 tablet Oral TID  . levETIRAcetam  500 mg Oral BID  . potassium chloride  10 mEq Oral BID  . sodium chloride flush  3 mL Intravenous Q12H  . tamsulosin  0.4 mg Oral QPC  supper   Continuous Infusions: . sodium chloride      LOS: 1 day   Kerney Elbe, DO [facility] PAGER is on [place]  If 7PM-7AM, please contact night-coverage [email or web address]

## 2020-04-12 NOTE — Progress Notes (Signed)
Unit RN heard a noise in patient's room. When entering the room, pt was found on the floor with head under the sink. Pt states he was wanting to get back to bed and decided to scoot to the edge of the chair to make it easier on the staff when they arrived. He slipped out of the chair and hit his head on the corner of the sink. Pt states his head no longer hurts. Pt returned to chair and vital signs obtained. On call Hospitalist notified. Pt states he will notify his family himself. Will implement low bed and mats in room.

## 2020-04-12 NOTE — Evaluation (Signed)
Physical Therapy Evaluation Patient Details Name: Tim Bradley MRN: [id number] DOB: 03-21-1951 Today's Date: 04/12/2020   History of Present Illness  68 year old with a past medical history difficult for but not limited to, hypertension, CKD stage IV, BPH, seizure disorder, history of type 2 diabetes mellitus as well as other comorbidities who presents with a chief complaint of leg swelling with generalized weakness as well as a fall at home. In ED found to have bilateral leg edema and weakness. Admitted 04/10/20 for treatment of acute systolic CHF, essential hypertension  and led edema and dyspnea  Clinical Impression  PTA pt reports living with girlfriend in single story home with 2 steps to enter. Pt reports he mainly uses w/c for mobility in his home, however pt has fallen trying to transfer from w/c to toilet. Pt is limited in safe mobility by decreased safety awareness in presence of decreased LE strength and ROM. Pt is min A for bed mobility and modA for lateral scoot transfer from bed to recliner. Pt would benefit from SNF level rehab at discharge to improve strength and transfers so that he can ultimately safely navigate his home environment. PT will continue to follow acutely     Follow Up Recommendations SNF    Equipment Recommendations  None recommended by PT    Recommendations for Other Services OT consult     Precautions / Restrictions Precautions Precautions: Fall Precaution Comments: self reports 3 falls in last month Restrictions Weight Bearing Restrictions: No      Mobility  Bed Mobility Overal bed mobility: Needs Assistance Bed Mobility: Supine to Sit     Supine to sit: Min assist     General bed mobility comments: able to get LE off of bed, increased cuing for use of bed rails to pull up to side, eventually able to pull to side and requires minA for bringing trunk to upright  Transfers Overall transfer level: Needs assistance Equipment used:  None Transfers: Lateral/Scoot Transfers          Lateral/Scoot Transfers: Mod assist General transfer comment: did not attempt standing due to decreased LE strength, pt able to laterally scoot close to drop arm recliner on his R, instructed to keep scooting to R and reach with R arm to the far armrest, at last second reached to far armrest with his L hand thus was facing the recliner in squatted position requiring modA for bringing hips around to sit in recliner. increased cuing given but not followed  Ambulation/Gait             General Gait Details: unable to attempt         Balance Overall balance assessment: Needs assistance Sitting-balance support: Feet supported;No upper extremity supported Sitting balance-Leahy Scale: Fair                                       Pertinent Vitals/Pain Pain Assessment: No/denies pain    Home Living Family/patient expects to be discharged to:: Private residence Living Arrangements: Non-relatives/Friends Available Help at Discharge: Friend(s);Available PRN/intermittently Type of Home: House Home Access: Stairs to enter Entrance Stairs-Rails: None Entrance Stairs-Number of Steps: 2 Home Layout: One level Home Equipment: Walker - 2 wheels;Cane - single point;Wheelchair - manual      Prior Function Level of Independence: Independent with assistive device(s)         Comments: mainly uses wc for mobility, sometimes SPC, reports  carpet on floor makes it difficult to use W/c     Hand Dominance   Dominant Hand: Left    Extremity/Trunk Assessment   Upper Extremity Assessment Upper Extremity Assessment: Generalized weakness    Lower Extremity Assessment Lower Extremity Assessment: RLE deficits/detail;LLE deficits/detail RLE Deficits / Details: knees lack full extension, strength assessed at 3+/5 RLE Sensation: history of peripheral neuropathy (up to calf) RLE Coordination: decreased fine motor LLE Deficits /  Details: knees lack full extension, strength assessed at 3+/5 LLE Sensation: history of peripheral neuropathy LLE Coordination: decreased fine motor       Communication   Communication: No difficulties  Cognition Arousal/Alertness: Awake/alert Behavior During Therapy: WFL for tasks assessed/performed Overall Cognitive Status: Impaired/Different from baseline Area of Impairment: Problem solving;Safety/judgement;Awareness;Following commands                       Following Commands: Follows multi-step commands inconsistently;Follows one step commands consistently Safety/Judgement: Decreased awareness of safety Awareness: Emergent Problem Solving: Difficulty sequencing;Requires verbal cues;Requires tactile cues;Slow processing        General Comments General comments (skin integrity, edema, etc.): VSS on RA        Assessment/Plan    PT Assessment Patient needs continued PT services  PT Problem List Decreased strength;Decreased range of motion;Decreased activity tolerance;Decreased balance;Decreased mobility;Decreased cognition;Decreased safety awareness;Cardiopulmonary status limiting activity       PT Treatment Interventions DME instruction;Gait training;Stair training;Functional mobility training;Therapeutic activities;Therapeutic exercise;Balance training;Cognitive remediation;Patient/family education    PT Goals (Current goals can be found in the Care Plan section)  Acute Rehab PT Goals Patient Stated Goal: go home PT Goal Formulation: With patient Time For Goal Achievement: 04/26/20 Potential to Achieve Goals: Fair    Frequency Min 2X/week   Barriers to discharge Inaccessible home environment         AM-PAC PT "6 Clicks" Mobility  Outcome Measure Help needed turning from your back to your side while in a flat bed without using bedrails?: None Help needed moving from lying on your back to sitting on the side of a flat bed without using bedrails?: A  Little Help needed moving to and from a bed to a chair (including a wheelchair)?: A Lot Help needed standing up from a chair using your arms (e.g., wheelchair or bedside chair)?: Total Help needed to walk in hospital room?: Total Help needed climbing 3-5 steps with a railing? : Total 6 Click Score: 12    End of Session Equipment Utilized During Treatment: Gait belt Activity Tolerance: Patient tolerated treatment well Patient left: in chair;with call bell/phone within reach;with chair alarm set Nurse Communication: Mobility status PT Visit Diagnosis: Unsteadiness on feet (R26.81);Other abnormalities of gait and mobility (R26.89);Muscle weakness (generalized) (M62.81);Difficulty in walking, not elsewhere classified (R26.2);Repeated falls (R29.6);History of falling (Z91.81)    Time: 3329-5188 PT Time Calculation (min) (ACUTE ONLY): 24 min   Charges:   PT Evaluation $PT Eval Moderate Complexity: 1 Mod PT Treatments $Therapeutic Activity: 8-22 mins        Osa Campoli B. Migdalia Dk PT, DPT Acute Rehabilitation Services Pager [phone] Office [phone]   [place] 04/12/2020, 5:05 PM

## 2020-04-13 ENCOUNTER — Inpatient Hospital Stay (HOSPITAL_COMMUNITY): Payer: No Typology Code available for payment source

## 2020-04-13 LAB — CBC WITH DIFFERENTIAL/PLATELET
Abs Immature Granulocytes: 0.05 10*3/uL (ref 0.00–0.07)
Basophils Absolute: 0 10*3/uL (ref 0.0–0.1)
Basophils Relative: 1 %
Eosinophils Absolute: 0.1 10*3/uL (ref 0.0–0.5)
Eosinophils Relative: 2 %
HCT: 37.5 % — ABNORMAL LOW (ref 39.0–52.0)
Hemoglobin: 12.1 g/dL — ABNORMAL LOW (ref 13.0–17.0)
Immature Granulocytes: 1 %
Lymphocytes Relative: 30 %
Lymphs Abs: 1.5 10*3/uL (ref 0.7–4.0)
MCH: 28.9 pg (ref 26.0–34.0)
MCHC: 32.3 g/dL (ref 30.0–36.0)
MCV: 89.7 fL (ref 80.0–100.0)
Monocytes Absolute: 0.6 10*3/uL (ref 0.1–1.0)
Monocytes Relative: 12 %
Neutro Abs: 2.6 10*3/uL (ref 1.7–7.7)
Neutrophils Relative %: 54 %
Platelets: 225 10*3/uL (ref 150–400)
RBC: 4.18 MIL/uL — ABNORMAL LOW (ref 4.22–5.81)
RDW: 13.1 % (ref 11.5–15.5)
WBC: 4.9 10*3/uL (ref 4.0–10.5)
nRBC: 0 % (ref 0.0–0.2)

## 2020-04-13 LAB — COMPREHENSIVE METABOLIC PANEL
ALT: 13 U/L (ref 0–44)
AST: 21 U/L (ref 15–41)
Albumin: 2.8 g/dL — ABNORMAL LOW (ref 3.5–5.0)
Alkaline Phosphatase: 45 U/L (ref 38–126)
Anion gap: 10 (ref 5–15)
BUN: 56 mg/dL — ABNORMAL HIGH (ref 8–23)
CO2: 25 mmol/L (ref 22–32)
Calcium: 8.8 mg/dL — ABNORMAL LOW (ref 8.9–10.3)
Chloride: 102 mmol/L (ref 98–111)
Creatinine, Ser: 2.87 mg/dL — ABNORMAL HIGH (ref 0.61–1.24)
GFR calc Af Amer: 24 mL/min — ABNORMAL LOW (ref >60)
GFR calc non Af Amer: 21 mL/min — ABNORMAL LOW (ref >60)
Glucose, Bld: 100 mg/dL — ABNORMAL HIGH (ref 70–99)
Potassium: 4.7 mmol/L (ref 3.5–5.1)
Sodium: 137 mmol/L (ref 135–145)
Total Bilirubin: 0.9 mg/dL (ref 0.3–1.2)
Total Protein: 5.8 g/dL — ABNORMAL LOW (ref 6.5–8.1)

## 2020-04-13 LAB — MAGNESIUM: Magnesium: 1.8 mg/dL (ref 1.7–2.4)

## 2020-04-13 LAB — PHOSPHORUS: Phosphorus: 4.9 mg/dL — ABNORMAL HIGH (ref 2.5–4.6)

## 2020-04-13 MED ORDER — ISOSORB DINITRATE-HYDRALAZINE 20-37.5 MG PO TABS: 1.0000 | ORAL_TABLET | Freq: Three times a day (TID) | ORAL | 0 refills | Status: DC

## 2020-04-13 MED ORDER — FUROSEMIDE 40 MG PO TABS
40.0000 mg | ORAL_TABLET | Freq: Every day | ORAL | Status: DC
  Filled 2020-04-13: qty 1

## 2020-04-13 MED ORDER — ASPIRIN 81 MG PO TBEC: 81.0000 mg | DELAYED_RELEASE_TABLET | Freq: Every day | ORAL | 11 refills | Status: DC

## 2020-04-13 MED ORDER — CARVEDILOL 3.125 MG PO TABS: 3.1250 mg | ORAL_TABLET | Freq: Two times a day (BID) | ORAL | 0 refills | Status: DC

## 2020-04-13 NOTE — Discharge Summary (Signed)
Physician Discharge Summary  Tim Bradley [name]:[id number] DOB: 04-01-1951 DOA: 04/10/2020  PCP: System, Pcp Not In  Admit date: 04/10/2020 Discharge date: 04/13/2020  Admitted From: Home Disposition: Signed out AMA  Recommendations for Outpatient Follow-up:  1. Follow up with PCP in 1-2 weeks 2. Follow up with Cardiology within 1-2 weeks 3. Follow up and establish with Nephrologist  4. Avoid Cocaine 5. Please obtain CMP/CBC, Mag, Phos in one week 6. Please follow up on the following pending results:  Home Health: No  Equipment/Devices: None   Discharge Condition: Guarded and Extremely Poor CODE STATUS: FULL CODE Diet recommendation: Heart Healthy Diet   Brief/Interim Summary: The patient is a 69 year old African-American male with a past medical history difficult for but not limited to, hypertension, CKD stage IV, BPH, seizure disorder, history of type 2 diabetes mellitus as well as other comorbidities who presents with a chief complaint of leg swelling with generalized weakness as well as a fall at home. Will last 2 weeks he reports increased leg swelling which has made it hard for him to get around the house. He currently lives alone son reports a fall yesterday and complains of generalized weakness and had some difficulty getting up. He is able to get to the phone to call EMS and transfer to the hospital. Denies any chest pain, loss of consciousness or seizure activity and feels he has been compliant with his medications. Has had home health arranged through the [facility] in [place] but they have not come out to initiate yet. He states that he does feel smothered when he lays flat he states that he has never been diagnosed with congestive heart failure. He reports urinary hesitancy due to his BPH and has not had any dysuria. He has a history of cocaine use with previous drug screens positive for cocaine but states that none recently. He drinks beer every other day and smokes 1 pack of  cigarettes a week.  In the ED he was found to have bilateral leg edema and the admitting physician noted bilateral rales on auscultation of his lungs. His BNP is elevated and he does have chronic kidney disease stage IV and also generalized weakness and not able to stand without the help of 2-3 staff members.   **Interim History Cardiology was consulted and patient underwent an echocardiogram which showed a left ventricular ejection fraction less than 20% with severely decreased function and grade 1 diastolic dysfunction.  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  Cardiology evaluated and started diuresis on the patient and they switched his diuresis to p.o.  PT and OT evaluated and recommended skilled nursing facility given the patient's extreme weakness.  Overnight the patient slipped out of his chair and hit his head against the sink and had a head CT done.  This morning I spoke with him and he wanted to leave I told him I did not feel that he is medically ready and stable to be discharged at this time as I wanted to speak with nephrology given his worsening renal function as well as continue treatment for his heart failure.  Patient stated that he had some urgent things and I have done at home and that he had a dog that he needed to take care of.  I advised him that he is not medically ready to be discharged yet and he subsequently being of sound mind signed out [place].  I discussed  the risks with him of worsening, decompensation and even possible death given his low ejection fraction he fully understands these risks and states that he will come back to the hospital if needed then.  The nurse and I both tried to convince the patient to stay but patient subsequently signed out [place] and has a very high risk of readmission  Discharge Diagnoses:  Principal Problem:   Acute CHF (congestive heart  failure) ([place]) Active Problems:   Seizure ([place])   Essential hypertension   Leg edema   Dyspnea   BPH (benign prostatic hyperplasia)   CKD (chronic kidney disease) stage 4, GFR 15-29 ml/min (HCC)   Generalized weakness  Acute Systolic and Grade 1 Diastolic CHF with EF of <54% -Tim Bradley is to be admitted to cardiac telemetry floor. -BNP was 1366.8  -Serialtroponins will be obtained to make sure no cardiac ischemia as etiology of CHF.Initial Troponin I was 18 and repeat was 14 -Patient be diuresed with Lasix 40 mg IV twice daily yesterday and received IV 40 mg this morning and cardiology recommends change to p.o. Lasix in the a.m. -Strict I's andO's. Monitor daily weight. -Obtain echocardiogram to evaluate wall motion, valvular structures, EF; echocardiogram showed left ventricular ejection fraction less than 20% with severely reduced function global hypokinesis as well as moderate concentric LVH and grade 1 diastolic dysfunction -Patient will be started on therapy for CHF with Coreg and Entresto but he was changed to BiDil at 1 tab p.o. 3 times daily -Check UDS given Hx of Cocaine Use and he again was positive; Was started on Coreg so need to be careful and currently is on 3.125 mg p.o. twice daily -Cardiology feels the need to optimize goal-directed medical therapy and there is a plan for repeat echocardiogram in 3 months and if the LV function has no improvement then can consider further ischemic work-up at that time -Patient is - 6.457 L since admission and had  -1457 out yesterday; weight not be done in the last weight was taken at 200 pounds and this morning was 212 questioning the accuracy -Continue strict I's and O's and daily weights -Currently they recommend cautious diuresis and given a dose of IV Lasix this yesterday morning and was changed to p.o. Lasix today -Cardiology recommends continuing aspirin, carvedilol, BiDil for now and recommending daily Lasix 40 minutes  daily -Continue to monitor for signs and symptoms of volume overload and repeat chest x-ray in a.m. -Chest x-ray this morning showed "No acute cardiopulmonary abnormality." -Patient subsequently signed out [place] after extensive discussion with him; will send his prescription to the pharmacy but unclear if he will actually pick them up and take his medications given his history of noncompliance and history of cocaine abuse  EssentialHypertension -Will stophome dose of Maxidegiven that he is going to be diuresed. -Patientwasstarted on Coreg and Entresto for blood pressure and CHFand will defer to Cardiology to change/adjust. Stopped his Nadara Mode start the patient on BiDil 1 tab p.o. 3 times daily -Continue toMonitor blood pressure per protocol -Blood pressure this morning was 122/70  -Patient signed out [place] 2/2to CHF.  -Being diuresed with Lasix and this was changed to p.o. -May consider obtaining LE venous Duplex to rule out DVT but his lower extremity swelling is improving -Signed out AGAINST MEDICAL ADVICE  Dyspnea, improving -Secondary to CHF. -C/wIncentive spirometer hour to 2 hours while awake. -SpO2: 96 % -C/wSupplemental oxygen as needed to keep O2  sat between 92-96%  AKI on CKD (chronic kidney disease) stage 4, GFR 15-29 ml/min (HCC), worsening Metabolic acidosis -Stable CKD stage IV. -BUN/Cr went from 46/2.61 ->46/2.41 -> 49/2.64 -> 56/2.87 -We will hold his Maxide given that he has been started on Coreg and Entresto but may need to be changed given his renal dysfunction -Patient CO2 is 20, anion gap is 13, chloride level is 110 yesterday; today his CO2 is 25, anion gap is 10, chloride level is 102 -Avoid nephrotoxic medications, contrast dyes, hypotension and renally dose medications -Repeat CMP in a.m. -We will need nephrology consultation in outpatient setting now that he has signed out  [place] -PT/OT to evaluate and Treat they recommending SNF but patient does not want to go to SNF and wants to go home.  -Patient fell overnight and hit his head on the sink and had a head CT done and is not steady on his feet but still wanted to go home and subsequently signed out AMA  SeizureDisorder(HCC) -Continue home dose ofLevetiracetam 500 mg po BID. -C/wSeizure precautions  BPH (benign prostatic hyperplasia) -Continue home dose ofTamsulosin 0.4 mg qHS  Tobacco use and polysubstance abuse -Smoking cessation education to be providedprior todischarge -Patient tested positive for cocaine and recommend cessation  AAA   -Cardiology recommends aggressive blood pressure control  -Had an echocardiogram done which showed an a sending aortic dilatation measuring 41 mm and 37 mm with Valsalva -Follow-up in outpatient setting  Discharge Instructions   Allergies as of 04/13/2020   No Known Allergies     Medication List    STOP taking these medications   dicyclomine 20 MG tablet Commonly known as: BENTYL   docusate sodium 250 MG capsule Commonly known as: COLACE   pantoprazole 40 MG tablet Commonly known as: PROTONIX   polyethylene glycol 17 g packet Commonly known as: MiraLax   sucralfate 1 g tablet Commonly known as: CARAFATE   triamterene-hydrochlorothiazide 75-50 MG tablet Commonly known as: MAXZIDE     TAKE these medications   aspirin 81 MG EC tablet Take 1 tablet (81 mg total) by mouth daily. Swallow whole. Start taking on: April 14, 2020   carvedilol 3.125 MG tablet Commonly known as: COREG Take 1 tablet (3.125 mg total) by mouth 2 (two) times daily with a meal.   furosemide 40 MG tablet Commonly known as: LASIX Take 1 tablet (40 mg total) by mouth daily.   isosorbide-hydrALAZINE 20-37.5 MG tablet Commonly known as: BIDIL Take 1 tablet by mouth 3 (three) times daily.   levETIRAcetam 500 MG  tablet Commonly known as: Keppra Take 1 tablet (500 mg total) by mouth 2 (two) times daily.   tamsulosin 0.4 MG Caps capsule Commonly known as: FLOMAX Take 1 capsule (0.4 mg total) by mouth daily after supper.       No Known Allergies  Consultations: Cardiology  Procedures/Studies: DG Chest 2 View  Result Date: 04/10/2020 CLINICAL DATA:  Shortness of breath EXAM: CHEST - 2 VIEW COMPARISON:  2018 FINDINGS: Mild chronic interstitial prominence with pulmonary vascular congestion. No pleural effusion or pneumothorax. Stable cardiomediastinal contours. No acute osseous abnormality. IMPRESSION: Mild pulmonary vascular congestion. Electronically Signed   By: Macy Mis M.D.   On: 04/10/2020 12:53   CT HEAD WO CONTRAST  Result Date: 04/13/2020 CLINICAL DATA:  69 year old male status post head trauma. EXAM: CT HEAD WITHOUT CONTRAST TECHNIQUE: Contiguous axial images were obtained from the base of the skull through the vertex without intravenous contrast. COMPARISON:  Brain MRI and head CT 07/21/2019 FINDINGS: Brain: Stable. No midline shift, ventriculomegaly, mass effect, evidence of mass lesion, intracranial hemorrhage or evidence of cortically based acute infarction. Mild patchy bilateral white matter hypodensity. Vascular: Calcified atherosclerosis at the skull base. No suspicious intracranial vascular hyperdensity. Chronic calcified atherosclerosis of the ACA branches suspected and stable. Skull: Stable, intact. Sinuses/Orbits: Visualized paranasal sinuses and mastoids are clear. Other: No acute orbit or scalp soft tissue injury identified. IMPRESSION: 1. No acute intracranial abnormality or acute traumatic injury identified. 2. Stable mild for age white matter changes. Electronically Signed   By: Genevie Ann M.D.   On: 04/13/2020 01:20   DG CHEST PORT 1 VIEW  Result Date: 04/13/2020 CLINICAL DATA:  69 year old male with shortness of breath. EXAM: PORTABLE CHEST 1 VIEW COMPARISON:  Portable  chest 04/12/2020 and earlier. FINDINGS: Portable AP semi upright view at 0108 hours. Stable lung volumes and mediastinal contours. Mild diffuse increased interstitial markings appear to be chronic compared to 2018 radiographs. Allowing for portable technique the lungs are clear. Visualized tracheal air column is within normal limits. No acute osseous abnormality identified. IMPRESSION: No acute cardiopulmonary abnormality. Electronically Signed   By: Genevie Ann M.D.   On: 04/13/2020 03:30   DG CHEST PORT 1 VIEW  Result Date: 04/12/2020 CLINICAL DATA:  Dyspnea. EXAM: PORTABLE CHEST 1 VIEW COMPARISON:  PA and lateral chest 04/10/2020 and 03/26/2017. FINDINGS: Lungs clear. Heart size normal. Aortic atherosclerosis. No pneumothorax or pleural fluid no acute focal bony abnormality. IMPRESSION: No acute disease. Aortic Atherosclerosis (ICD10-I70.0). Electronically Signed   By: Inge Rise M.D.   On: 04/12/2020 15:48   ECHOCARDIOGRAM COMPLETE  Result Date: 04/11/2020    ECHOCARDIOGRAM REPORT   Patient Name:   Tim Bradley Date of Exam: 04/11/2020 Medical Rec #:  [id number]     Height:       74.0 in Accession #:    [id number]    Weight:       200.0 lb Date of Birth:  23-Apr-1951     BSA:          2.173 m Patient Age:    4 years      BP:           156/94 mmHg Patient Gender: M             HR:           71 bpm. Exam Location:  Inpatient Procedure: 2D Echo, Cardiac Doppler and Color Doppler Indications:    Dyspnea  History:        Patient has no prior history of Echocardiogram examinations.                 COPD, Signs/Symptoms:Dyspnea; Risk Factors:Hypertension and                 Diabetes. CKD.  Sonographer:    Clayton Lefort RDCS (AE) Referring Phys: [id number] [place]  1. Left ventricular ejection fraction, by estimation, is <20%. The left ventricle has severely decreased function. The left ventricle demonstrates global hypokinesis. There is moderate concentric left ventricular hypertrophy. Left  ventricular diastolic parameters are consistent with Grade I diastolic dysfunction (impaired relaxation).  2. Right ventricular systolic function is mildly reduced. The right ventricular size is normal. There is normal pulmonary artery systolic pressure.  3. Left atrial size was mildly dilated.  4. The mitral valve is normal in structure. Trivial mitral valve regurgitation. No evidence of mitral stenosis.  5. The aortic  valve is tricuspid. Aortic valve regurgitation is not visualized. No aortic stenosis is present.  6. Aortic dilatation noted. Aneurysm of the ascending aorta, measuring 41 mm. There is mild dilatation at the level of the sinuses of Valsalva measuring 37 mm.  7. The inferior vena cava is dilated in size with >50% respiratory variability, suggesting right atrial pressure of 8 mmHg. FINDINGS  Left Ventricle: Left ventricular ejection fraction, by estimation, is <20%. The left ventricle has severely decreased function. The left ventricle demonstrates global hypokinesis. The left ventricular internal cavity size was normal in size. There is moderate concentric left ventricular hypertrophy. Left ventricular diastolic parameters are consistent with Grade I diastolic dysfunction (impaired relaxation). Right Ventricle: The right ventricular size is normal. No increase in right ventricular wall thickness. Right ventricular systolic function is mildly reduced. There is normal pulmonary artery systolic pressure. The tricuspid regurgitant velocity is 2.35 m/s, and with an assumed right atrial pressure of 8 mmHg, the estimated right ventricular systolic pressure is 63.8 mmHg. Left Atrium: Left atrial size was mildly dilated. Right Atrium: Right atrial size was normal in size. Pericardium: There is no evidence of pericardial effusion. Mitral Valve: The mitral valve is normal in structure. Normal mobility of the mitral valve leaflets. Trivial mitral valve regurgitation. No evidence of mitral valve stenosis.  Tricuspid Valve: The tricuspid valve is normal in structure. Tricuspid valve regurgitation is trivial. No evidence of tricuspid stenosis. Aortic Valve: The aortic valve is tricuspid. Aortic valve regurgitation is not visualized. No aortic stenosis is present. Aortic valve mean gradient measures 2.0 mmHg. Aortic valve peak gradient measures 3.4 mmHg. Aortic valve area, by VTI measures 2.39 cm. Pulmonic Valve: The pulmonic valve was normal in structure. Pulmonic valve regurgitation is not visualized. No evidence of pulmonic stenosis. Aorta: Aortic dilatation noted. There is mild dilatation at the level of the sinuses of Valsalva measuring 37 mm. There is an aneurysm involving the ascending aorta. The aneurysm measures 41 mm. Venous: The inferior vena cava is dilated in size with greater than 50% respiratory variability, suggesting right atrial pressure of 8 mmHg. IAS/Shunts: No atrial level shunt detected by color flow Doppler.  LEFT VENTRICLE PLAX 2D LVIDd:         4.60 cm LVIDs:         4.20 cm LV PW:         1.29 cm LV IVS:        1.46 cm LVOT diam:     2.00 cm LV SV:         38 LV SV Index:   17 LVOT Area:     3.14 cm  RIGHT VENTRICLE             IVC RV Basal diam:  3.20 cm     IVC diam: 2.20 cm RV S prime:     13.70 cm/s TAPSE (M-mode): 2.1 cm LEFT ATRIUM           Index       RIGHT ATRIUM           Index LA diam:      4.20 cm 1.93 cm/m  RA Area:     15.80 cm LA Vol (A4C): 71.8 ml 33.03 ml/m RA Volume:   38.90 ml  17.90 ml/m  AORTIC VALVE AV Area (Vmax):    2.62 cm AV Area (Vmean):   2.61 cm AV Area (VTI):     2.39 cm AV Vmax:  91.90 cm/s AV Vmean:          61.000 cm/s AV VTI:            0.158 m AV Peak Grad:      3.4 mmHg AV Mean Grad:      2.0 mmHg LVOT Vmax:         76.60 cm/s LVOT Vmean:        50.700 cm/s LVOT VTI:          0.120 m LVOT/AV VTI ratio: 0.76  AORTA Ao Root diam: 3.70 cm Ao Asc diam:  4.10 cm TRICUSPID VALVE TR Peak grad:   22.1 mmHg TR Vmax:        235.00 cm/s  SHUNTS Systemic  VTI:  0.12 m Systemic Diam: 2.00 cm Skeet Latch MD Electronically signed by Skeet Latch MD Signature Date/Time: 04/11/2020/6:29:12 PM    Final     Subjective: Seen and examined at bedside and was wanting to go home and I spoke to him extensively that I did not feel that he is medically ready to go home given his continued work-up and worsening renal function as well as continuing volume overload.  Patient was of sound mind and after extensive discussion he decided to sign out [place] and states that he needs to go home because "somebody is in my house and I need to take care of my dog".  I discussed the risks of leaving [place] including worsening, decompensation and even possible death given his new onset cardiomyopathy with a low EF.  Patient understands and despite knowing the risks still proceeded to sign out [place].  Discharge Exam: Vitals:   04/13/20 0638 04/13/20 0729  BP: (!) 141/77 122/70  Pulse: 67 67  Resp: 18 18  Temp: 97.6 F (36.4 C) 97.6 F (36.4 C)  SpO2: 94% 96%   Vitals:   04/12/20 2020 04/12/20 2309 04/13/20 0638 04/13/20 0729  BP: 105/68 100/87 (!) 141/77 122/70  Pulse: 80 80 67 67  Resp: 20  18 18   Temp: 98.1 F (36.7 C) (!) 97.5 F (36.4 C) 97.6 F (36.4 C) 97.6 F (36.4 C)  TempSrc: Oral Axillary Oral Oral  SpO2: 93% 100% 94% 96%  Weight:   96.2 kg   Height:       General: Pt is alert, awake, not in acute distress Cardiovascular: RRR, S1/S2 +, no rubs, no gallops Respiratory: Diminished bilaterally, no wheezing, no rhonchi; unlabored breathing Abdominal: Soft, NT, Distended , bowel sounds + Extremities: 2+ LE edema, no cyanosis  The results of significant diagnostics from this hospitalization (including imaging, microbiology, ancillary and laboratory) are listed below for reference.    Microbiology: Recent Results (from the past 240 hour(s))  SARS Coronavirus 2 by RT PCR (hospital order, performed  in [facility] hospital lab) Nasopharyngeal Nasopharyngeal Swab     Status: None   Collection Time: 04/11/20  6:15 AM   Specimen: Nasopharyngeal Swab  Result Value Ref Range Status   SARS Coronavirus 2 NEGATIVE NEGATIVE Final    Comment: (NOTE) SARS-CoV-2 target nucleic acids are NOT DETECTED.  The SARS-CoV-2 RNA is generally detectable in upper and lower respiratory specimens during the acute phase of infection. The lowest concentration of SARS-CoV-2 viral copies this assay can detect is 250 copies / mL. A negative result does not preclude SARS-CoV-2 infection and should not be used as the sole basis for treatment or other patient management decisions.  A negative result may occur with  improper specimen collection / handling, submission of specimen other than nasopharyngeal swab, presence of viral mutation(s) within the areas targeted by this assay, and inadequate number of viral copies (<250 copies / mL). A negative result must be combined with clinical observations, patient history, and epidemiological information.  Fact Sheet for Patients:   StrictlyIdeas.no  Fact Sheet for Healthcare Providers: BankingDealers.co.za  This test is not yet approved or  cleared by the [place] FDA and has been authorized for detection and/or diagnosis of SARS-CoV-2 by FDA under an Emergency Use Authorization (EUA).  This EUA will remain in effect (meaning this test can be used) for the duration of the COVID-19 declaration under Section 564(b)(1) of the Act, 21 U.S.C. section 360bbb-3(b)(1), unless the authorization is terminated or revoked sooner.  Performed at [facility], [place] [address] [place], [place] [address]     Labs: BNP (last 3 results) Recent Labs    04/10/20 2234  BNP 2,703.5*   Basic Metabolic Panel: Recent Labs  Lab 04/10/20 1229 04/11/20 0805 04/12/20 0500 04/13/20 0322  NA 141 143 139 137  K 5.3* 4.7 4.7  4.7  CL 113* 110 104 102  CO2 19* 20* 23 25  GLUCOSE 96 86 129* 100*  BUN 46* 46* 49* 56*  CREATININE 2.61* 2.41* 2.64* 2.87*  CALCIUM 8.9 9.4 9.1 8.8*  MG  --  2.0 1.8 1.8  PHOS  --  4.1 4.4 4.9*   Liver Function Tests: Recent Labs  Lab 04/11/20 0805 04/12/20 0500 04/13/20 0322  AST 18 19 21   ALT 15 18 13   ALKPHOS 54 50 45  BILITOT 0.7 0.7 0.9  PROT 5.8* 6.1* 5.8*  ALBUMIN 3.2* 3.0* 2.8*   No results for input(s): LIPASE, AMYLASE in the last 168 hours. No results for input(s): AMMONIA in the last 168 hours. CBC: Recent Labs  Lab 04/10/20 1229 04/11/20 0805 04/12/20 0500 04/13/20 0322  WBC 6.2 5.9 4.4 4.9  NEUTROABS  --  3.9 2.4 2.6  HGB 12.5* 13.3 13.3 12.1*  HCT 39.2 41.9 42.2 37.5*  MCV 90.7 90.3 91.3 89.7  PLT 235 231 217 225   Cardiac Enzymes: No results for input(s): CKTOTAL, CKMB, CKMBINDEX, TROPONINI in the last 168 hours. BNP: Invalid input(s): POCBNP CBG: No results for input(s): GLUCAP in the last 168 hours. D-Dimer No results for input(s): DDIMER in the last 72 hours. Hgb A1c No results for input(s): HGBA1C in the last 72 hours. Lipid Profile No results for input(s): CHOL, HDL, LDLCALC, TRIG, CHOLHDL, LDLDIRECT in the last 72 hours. Thyroid function studies Recent Labs    04/11/20 0805  TSH 1.213   Anemia work up No results for input(s): VITAMINB12, FOLATE, FERRITIN, TIBC, IRON, RETICCTPCT in the last 72 hours. Urinalysis    Component Value Date/Time   COLORURINE COLORLESS (A) 04/11/2020 0144   APPEARANCEUR CLEAR 04/11/2020 0144   LABSPEC 1.005 04/11/2020 0144   PHURINE 5.0 04/11/2020 0144   GLUCOSEU NEGATIVE 04/11/2020 0144   HGBUR SMALL (A) 04/11/2020 0144   BILIRUBINUR NEGATIVE 04/11/2020 0144   KETONESUR NEGATIVE 04/11/2020 0144   PROTEINUR 30 (A) 04/11/2020 0144   UROBILINOGEN 0.2 03/11/2012 2012   NITRITE NEGATIVE 04/11/2020 0144   LEUKOCYTESUR NEGATIVE 04/11/2020 0144   Sepsis Labs Invalid input(s): PROCALCITONIN,  WBC,   LACTICIDVEN Microbiology Recent Results (from the past 240 hour(s))  SARS Coronavirus 2 by RT PCR (hospital order, performed in [facility] hospital lab) Nasopharyngeal Nasopharyngeal Swab     Status: None  Collection Time: 04/11/20  6:15 AM   Specimen: Nasopharyngeal Swab  Result Value Ref Range Status   SARS Coronavirus 2 NEGATIVE NEGATIVE Final    Comment: (NOTE) SARS-CoV-2 target nucleic acids are NOT DETECTED.  The SARS-CoV-2 RNA is generally detectable in upper and lower respiratory specimens during the acute phase of infection. The lowest concentration of SARS-CoV-2 viral copies this assay can detect is 250 copies / mL. A negative result does not preclude SARS-CoV-2 infection and should not be used as the sole basis for treatment or other patient management decisions.  A negative result may occur with improper specimen collection / handling, submission of specimen other than nasopharyngeal swab, presence of viral mutation(s) within the areas targeted by this assay, and inadequate number of viral copies (<250 copies / mL). A negative result must be combined with clinical observations, patient history, and epidemiological information.  Fact Sheet for Patients:   StrictlyIdeas.no  Fact Sheet for Healthcare Providers: [email or web address]  This test is not yet approved or  cleared by the [place] FDA and has been authorized for detection and/or diagnosis of SARS-CoV-2 by FDA under an Emergency Use Authorization (EUA).  This EUA will remain in effect (meaning this test can be used) for the duration of the COVID-19 declaration under Section 564(b)(1) of the Act, 21 U.S.C. section 360bbb-3(b)(1), unless the authorization is terminated or revoked sooner.  Performed at [facility], [place] [address], [place], [place] [address]    Time coordinating discharge: 25 Minutes  SIGNED:  Kerney Elbe, DO [facility] 04/13/2020, 11:25 AM Pager is on [place]  If 7PM-7AM, please contact night-coverage [email or web address]

## 2020-04-13 NOTE — Progress Notes (Signed)
Patient verbalizing concerns regarding care of his dog, house and situation of sale of his house. Reviewed patient condition. Unable to sit up or walk. Needing help with moving, dressing and and falling last night. Inquired if anyone else is able to take care of the dog and his home so he can stay and be cared for in the hospital. Patient stated he lives alone and was like this before he came in and would be fine and come back when he straightened some things out. He would have his dog stay with a family member and take care of the problems with his home before coming back. Patient informed that we would be unable to help with transportation home or give him discharge paperwork or medication prescriptions because he was leaving against medical advise. Physician and cardiologist both feel strongly that he should stay. Nursing staff very concerned about him going home at this time. Patient understood but still wanted to go home and found a friend to take him home.  AMA paperwork reviewed and signed by patient and patient left with friend transporting him in the wheelchair.

## 2020-04-13 NOTE — Progress Notes (Addendum)
Progress Note  Patient Name: Tim Bradley Date of Encounter: 04/13/2020  Primary Cardiologist: Dr. Johnsie Cancel, MD   Subjective   No specific complaints today. Wants to go home to take care of his dog.   Inpatient Medications    Scheduled Meds:  aspirin EC  81 mg Oral Daily   carvedilol  3.125 mg Oral BID WC   heparin injection (subcutaneous)  5,000 Units Subcutaneous Q8H   isosorbide-hydrALAZINE  1 tablet Oral TID   levETIRAcetam  500 mg Oral BID   potassium chloride  10 mEq Oral BID   sodium chloride flush  3 mL Intravenous Q12H   tamsulosin  0.4 mg Oral QPC supper   Continuous Infusions:  sodium chloride     PRN Meds: sodium chloride, acetaminophen, ondansetron (ZOFRAN) IV, sodium chloride flush   Vital Signs    Vitals:   04/12/20 2020 04/12/20 2309 04/13/20 0638 04/13/20 0729  BP: 105/68 100/87 (!) 141/77 122/70  Pulse: 80 80 67 67  Resp: 20  18 18   Temp: 98.1 F (36.7 C) (!) 97.5 F (36.4 C) 97.6 F (36.4 C) 97.6 F (36.4 C)  TempSrc: Oral Axillary Oral Oral  SpO2: 93% 100% 94% 96%  Weight:   96.2 kg   Height:        Intake/Output Summary (Last 24 hours) at 04/13/2020 0802 Last data filed at 04/13/2020 0635 Gross per 24 hour  Intake 243 ml  Output 1700 ml  Net -1457 ml   Filed Weights   04/10/20 1217 04/10/20 1227 04/13/20 0638  Weight: 90.7 kg 90.7 kg 96.2 kg    Physical Exam   General: Well developed, well nourished, NAD Neck: Negative for carotid bruits. No JVD Lungs:Clear to ausculation bilaterally. Breathing is unlabored. Cardiovascular: RRR with S1 S2. No murmurs Abdomen: Soft, non-tender, distended. No obvious abdominal masses. Extremities: 1-2+ BLE edema. Radial pulses 2+ bilaterally Neuro: Alert and oriented. No focal deficits. No facial asymmetry. MAE spontaneously. Psych: Responds to questions appropriately with normal affect.    Labs    Chemistry Recent Labs  Lab 04/11/20 0805 04/12/20 0500 04/13/20 0322  NA 143 139 137   K 4.7 4.7 4.7  CL 110 104 102  CO2 20* 23 25  GLUCOSE 86 129* 100*  BUN 46* 49* 56*  CREATININE 2.41* 2.64* 2.87*  CALCIUM 9.4 9.1 8.8*  PROT 5.8* 6.1* 5.8*  ALBUMIN 3.2* 3.0* 2.8*  AST 18 19 21   ALT 15 18 13   ALKPHOS 54 50 45  BILITOT 0.7 0.7 0.9  GFRNONAA 26* 23* 21*  GFRAA 30* 27* 24*  ANIONGAP 13 12 10      Hematology Recent Labs  Lab 04/11/20 0805 04/12/20 0500 04/13/20 0322  WBC 5.9 4.4 4.9  RBC 4.64 4.62 4.18*  HGB 13.3 13.3 12.1*  HCT 41.9 42.2 37.5*  MCV 90.3 91.3 89.7  MCH 28.7 28.8 28.9  MCHC 31.7 31.5 32.3  RDW 13.2 13.2 13.1  PLT 231 217 225    Cardiac EnzymesNo results for input(s): TROPONINI in the last 168 hours. No results for input(s): TROPIPOC in the last 168 hours.   BNP Recent Labs  Lab 04/10/20 2234  BNP 1,366.8*     DDimer No results for input(s): DDIMER in the last 168 hours.   Radiology    CT HEAD WO CONTRAST  Result Date: 04/13/2020 CLINICAL DATA:  69 year old male status post head trauma. EXAM: CT HEAD WITHOUT CONTRAST TECHNIQUE: Contiguous axial images were obtained from the base of the  skull through the vertex without intravenous contrast. COMPARISON:  Brain MRI and head CT 07/21/2019 FINDINGS: Brain: Stable. No midline shift, ventriculomegaly, mass effect, evidence of mass lesion, intracranial hemorrhage or evidence of cortically based acute infarction. Mild patchy bilateral white matter hypodensity. Vascular: Calcified atherosclerosis at the skull base. No suspicious intracranial vascular hyperdensity. Chronic calcified atherosclerosis of the ACA branches suspected and stable. Skull: Stable, intact. Sinuses/Orbits: Visualized paranasal sinuses and mastoids are clear. Other: No acute orbit or scalp soft tissue injury identified. IMPRESSION: 1. No acute intracranial abnormality or acute traumatic injury identified. 2. Stable mild for age white matter changes. Electronically Signed   By: Genevie Ann M.D.   On: 04/13/2020 01:20   DG CHEST  PORT 1 VIEW  Result Date: 04/13/2020 CLINICAL DATA:  69 year old male with shortness of breath. EXAM: PORTABLE CHEST 1 VIEW COMPARISON:  Portable chest 04/12/2020 and earlier. FINDINGS: Portable AP semi upright view at 0108 hours. Stable lung volumes and mediastinal contours. Mild diffuse increased interstitial markings appear to be chronic compared to 2018 radiographs. Allowing for portable technique the lungs are clear. Visualized tracheal air column is within normal limits. No acute osseous abnormality identified. IMPRESSION: No acute cardiopulmonary abnormality. Electronically Signed   By: Genevie Ann M.D.   On: 04/13/2020 03:30   DG CHEST PORT 1 VIEW  Result Date: 04/12/2020 CLINICAL DATA:  Dyspnea. EXAM: PORTABLE CHEST 1 VIEW COMPARISON:  PA and lateral chest 04/10/2020 and 03/26/2017. FINDINGS: Lungs clear. Heart size normal. Aortic atherosclerosis. No pneumothorax or pleural fluid no acute focal bony abnormality. IMPRESSION: No acute disease. Aortic Atherosclerosis (ICD10-I70.0). Electronically Signed   By: Inge Rise M.D.   On: 04/12/2020 15:48   ECHOCARDIOGRAM COMPLETE  Result Date: 04/11/2020    ECHOCARDIOGRAM REPORT   Patient Name:   Tim Bradley Date of Exam: 04/11/2020 Medical Rec #:  [id number]     Height:       74.0 in Accession #:    [id number]    Weight:       200.0 lb Date of Birth:  07/27/1951     BSA:          2.173 m Patient Age:    66 years      BP:           156/94 mmHg Patient Gender: M             HR:           71 bpm. Exam Location:  Inpatient Procedure: 2D Echo, Cardiac Doppler and Color Doppler Indications:    Dyspnea  History:        Patient has no prior history of Echocardiogram examinations.                 COPD, Signs/Symptoms:Dyspnea; Risk Factors:Hypertension and                 Diabetes. CKD.  Sonographer:    Clayton Lefort RDCS (AE) Referring Phys: [id number] [place]  1. Left ventricular ejection fraction, by estimation, is <20%. The left ventricle has  severely decreased function. The left ventricle demonstrates global hypokinesis. There is moderate concentric left ventricular hypertrophy. Left ventricular diastolic parameters are consistent with Grade I diastolic dysfunction (impaired relaxation).  2. Right ventricular systolic function is mildly reduced. The right ventricular size is normal. There is normal pulmonary artery systolic pressure.  3. Left atrial size was mildly dilated.  4. The mitral valve is normal in structure. Trivial mitral valve regurgitation.  No evidence of mitral stenosis.  5. The aortic valve is tricuspid. Aortic valve regurgitation is not visualized. No aortic stenosis is present.  6. Aortic dilatation noted. Aneurysm of the ascending aorta, measuring 41 mm. There is mild dilatation at the level of the sinuses of Valsalva measuring 37 mm.  7. The inferior vena cava is dilated in size with >50% respiratory variability, suggesting right atrial pressure of 8 mmHg. FINDINGS  Left Ventricle: Left ventricular ejection fraction, by estimation, is <20%. The left ventricle has severely decreased function. The left ventricle demonstrates global hypokinesis. The left ventricular internal cavity size was normal in size. There is moderate concentric left ventricular hypertrophy. Left ventricular diastolic parameters are consistent with Grade I diastolic dysfunction (impaired relaxation). Right Ventricle: The right ventricular size is normal. No increase in right ventricular wall thickness. Right ventricular systolic function is mildly reduced. There is normal pulmonary artery systolic pressure. The tricuspid regurgitant velocity is 2.35 m/s, and with an assumed right atrial pressure of 8 mmHg, the estimated right ventricular systolic pressure is 40.9 mmHg. Left Atrium: Left atrial size was mildly dilated. Right Atrium: Right atrial size was normal in size. Pericardium: There is no evidence of pericardial effusion. Mitral Valve: The mitral valve is  normal in structure. Normal mobility of the mitral valve leaflets. Trivial mitral valve regurgitation. No evidence of mitral valve stenosis. Tricuspid Valve: The tricuspid valve is normal in structure. Tricuspid valve regurgitation is trivial. No evidence of tricuspid stenosis. Aortic Valve: The aortic valve is tricuspid. Aortic valve regurgitation is not visualized. No aortic stenosis is present. Aortic valve mean gradient measures 2.0 mmHg. Aortic valve peak gradient measures 3.4 mmHg. Aortic valve area, by VTI measures 2.39 cm. Pulmonic Valve: The pulmonic valve was normal in structure. Pulmonic valve regurgitation is not visualized. No evidence of pulmonic stenosis. Aorta: Aortic dilatation noted. There is mild dilatation at the level of the sinuses of Valsalva measuring 37 mm. There is an aneurysm involving the ascending aorta. The aneurysm measures 41 mm. Venous: The inferior vena cava is dilated in size with greater than 50% respiratory variability, suggesting right atrial pressure of 8 mmHg. IAS/Shunts: No atrial level shunt detected by color flow Doppler.  LEFT VENTRICLE PLAX 2D LVIDd:         4.60 cm LVIDs:         4.20 cm LV PW:         1.29 cm LV IVS:        1.46 cm LVOT diam:     2.00 cm LV SV:         38 LV SV Index:   17 LVOT Area:     3.14 cm  RIGHT VENTRICLE             IVC RV Basal diam:  3.20 cm     IVC diam: 2.20 cm RV S prime:     13.70 cm/s TAPSE (M-mode): 2.1 cm LEFT ATRIUM           Index       RIGHT ATRIUM           Index LA diam:      4.20 cm 1.93 cm/m  RA Area:     15.80 cm LA Vol (A4C): 71.8 ml 33.03 ml/m RA Volume:   38.90 ml  17.90 ml/m  AORTIC VALVE AV Area (Vmax):    2.62 cm AV Area (Vmean):   2.61 cm AV Area (VTI):     2.39 cm AV Vmax:  91.90 cm/s AV Vmean:          61.000 cm/s AV VTI:            0.158 m AV Peak Grad:      3.4 mmHg AV Mean Grad:      2.0 mmHg LVOT Vmax:         76.60 cm/s LVOT Vmean:        50.700 cm/s LVOT VTI:          0.120 m LVOT/AV VTI ratio:  0.76  AORTA Ao Root diam: 3.70 cm Ao Asc diam:  4.10 cm TRICUSPID VALVE TR Peak grad:   22.1 mmHg TR Vmax:        235.00 cm/s  SHUNTS Systemic VTI:  0.12 m Systemic Diam: 2.00 cm Tim Latch MD Electronically signed by Tim Latch MD Signature Date/Time: 04/11/2020/6:29:12 PM    Final    Telemetry    04/13/20 NSR- Personally Reviewed  ECG    No new tracing as of 04/13/20- Personally Reviewed  Cardiac Studies   Echocardiogram 04/11/2020:   1. Left ventricular ejection fraction, by estimation, is <20%. The left  ventricle has severely decreased function. The left ventricle demonstrates  global hypokinesis. There is moderate concentric left ventricular  hypertrophy. Left ventricular diastolic  parameters are consistent with Grade I diastolic dysfunction (impaired  relaxation).   2. Right ventricular systolic function is mildly reduced. The right  ventricular size is normal. There is normal pulmonary artery systolic  pressure.   3. Left atrial size was mildly dilated.   4. The mitral valve is normal in structure. Trivial mitral valve  regurgitation. No evidence of mitral stenosis.   5. The aortic valve is tricuspid. Aortic valve regurgitation is not  visualized. No aortic stenosis is present.   6. Aortic dilatation noted. Aneurysm of the ascending aorta, measuring 41  mm. There is mild dilatation at the level of the sinuses of Valsalva  measuring 37 mm.   7. The inferior vena cava is dilated in size with >50% respiratory  variability, suggesting right atrial pressure of 8 mmHg.   Patient Profile     69 y.o. male with a hx of hypertension, chronic kidney disease stage IV, seizure disorder, cocaine abuse, diabetes mellitus, tobacco smoking and alcohol abuse who is being seen today for the evaluation of CHF at the request of Dr. Alfredia Ferguson.   Assessment & Plan    1. Acute systolic CHF exacerbation; LVEF <20%: -Patient presented with a 3-week history of progressive lower extremity  edema, shortness of breath and orthopnea symptoms  -BNP 1366 with a CXR with vascular congestion -Started on Coreg and Entresto by primary team -Entresto was changed to BiDil given CKD>>continue for now  -Echocardiogram from 04/11/2020 shows an LVEF at <20% with global hypokinesis and moderate concentric LVH, G1 DD and an aortic aneurysm measuring 41 mm below disease -Needs optimal GDMT with plan for repeat echocardiogram in 3 months.  If LV with no improvement, consider further ischemic work-up at that time>>plan to follow with AHF clinic at d/c>>concern about compliance -Still has LE edema on exam however creatinine more elevated than yesterday>>>only received one dose of IV Lasix yesterday  -Continue ASA, carvedilol, Bidil   2. CKD Stage IV: -Creatinine, 2.87 today>>>up from 2.64 yesterday -Baseline appears to be 2.0 range   3.  Hypertension: -Stable, 122/70>>>141/77>>100/87 -Continue Coreg -Change BiDil   4. Generalized weakness/seizure disorder: -Per primary team   5. Polysubstance abuse:  -Cessation strongly encouraged  6. LBBB: -Noted 07/2018 first time. No ischemic evaluation.  -Denies chest pain    7.  AAA: -Noted on echocardiogram from 04/11/2020 with ascending aortic dilation measuring 41 mm -Continue with aggressive BP control  Signed, Kathyrn Drown NP-C [facility] Pager: [phone] 04/13/2020, 8:02 AM     For questions or updates, please contact   Please consult [email or web address] for contact info under Cardiology/STEMI.  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 Continue daily lasix follow Cr consider nephrology consult Not a candidate for iv inotropic Rx Continue coreg and Bidil   Jenkins Rouge MD [facility]

## 2020-04-21 ENCOUNTER — Inpatient Hospital Stay (HOSPITAL_COMMUNITY)
Admission: EM | Admit: 2020-04-21 | Discharge: 2020-04-26 | DRG: 291 | Disposition: A | Discharge status: Home Health | Payer: No Typology Code available for payment source | Attending: Internal Medicine | Admitting: Internal Medicine

## 2020-04-21 ENCOUNTER — Other Ambulatory Visit: Payer: Self-pay

## 2020-04-21 DIAGNOSIS — I509 Heart failure, unspecified: Secondary | ICD-10-CM

## 2020-04-21 DIAGNOSIS — Z79899 Other long term (current) drug therapy: Secondary | ICD-10-CM

## 2020-04-21 DIAGNOSIS — Z7982 Long term (current) use of aspirin: Secondary | ICD-10-CM

## 2020-04-21 DIAGNOSIS — R262 Difficulty in walking, not elsewhere classified: Secondary | ICD-10-CM | POA: Diagnosis present

## 2020-04-21 DIAGNOSIS — Z801 Family history of malignant neoplasm of trachea, bronchus and lung: Secondary | ICD-10-CM

## 2020-04-21 DIAGNOSIS — I5041 Acute combined systolic (congestive) and diastolic (congestive) heart failure: Secondary | ICD-10-CM | POA: Insufficient documentation

## 2020-04-21 DIAGNOSIS — F17210 Nicotine dependence, cigarettes, uncomplicated: Secondary | ICD-10-CM | POA: Diagnosis present

## 2020-04-21 DIAGNOSIS — N184 Chronic kidney disease, stage 4 (severe): Secondary | ICD-10-CM | POA: Diagnosis present

## 2020-04-21 DIAGNOSIS — Z8249 Family history of ischemic heart disease and other diseases of the circulatory system: Secondary | ICD-10-CM

## 2020-04-21 DIAGNOSIS — N400 Enlarged prostate without lower urinary tract symptoms: Secondary | ICD-10-CM | POA: Diagnosis present

## 2020-04-21 DIAGNOSIS — N179 Acute kidney failure, unspecified: Secondary | ICD-10-CM | POA: Diagnosis present

## 2020-04-21 DIAGNOSIS — Z20822 Contact with and (suspected) exposure to covid-19: Secondary | ICD-10-CM | POA: Diagnosis present

## 2020-04-21 DIAGNOSIS — R569 Unspecified convulsions: Secondary | ICD-10-CM

## 2020-04-21 DIAGNOSIS — Z950 Presence of cardiac pacemaker: Secondary | ICD-10-CM

## 2020-04-21 DIAGNOSIS — E1122 Type 2 diabetes mellitus with diabetic chronic kidney disease: Secondary | ICD-10-CM | POA: Diagnosis present

## 2020-04-21 DIAGNOSIS — I130 Hypertensive heart and chronic kidney disease with heart failure and stage 1 through stage 4 chronic kidney disease, or unspecified chronic kidney disease: Principal | ICD-10-CM | POA: Diagnosis present

## 2020-04-21 DIAGNOSIS — Z91120 Patient's intentional underdosing of medication regimen due to financial hardship: Secondary | ICD-10-CM

## 2020-04-21 DIAGNOSIS — R531 Weakness: Secondary | ICD-10-CM

## 2020-04-21 DIAGNOSIS — I5043 Acute on chronic combined systolic (congestive) and diastolic (congestive) heart failure: Secondary | ICD-10-CM | POA: Diagnosis present

## 2020-04-21 DIAGNOSIS — I5023 Acute on chronic systolic (congestive) heart failure: Secondary | ICD-10-CM | POA: Insufficient documentation

## 2020-04-21 DIAGNOSIS — T501X6A Underdosing of loop [high-ceiling] diuretics, initial encounter: Secondary | ICD-10-CM | POA: Diagnosis present

## 2020-04-21 DIAGNOSIS — Z9181 History of falling: Secondary | ICD-10-CM

## 2020-04-21 DIAGNOSIS — I10 Essential (primary) hypertension: Secondary | ICD-10-CM | POA: Diagnosis present

## 2020-04-21 DIAGNOSIS — J9601 Acute respiratory failure with hypoxia: Secondary | ICD-10-CM | POA: Diagnosis present

## 2020-04-21 DIAGNOSIS — G40909 Epilepsy, unspecified, not intractable, without status epilepticus: Secondary | ICD-10-CM | POA: Diagnosis present

## 2020-04-21 LAB — CBC WITH DIFFERENTIAL/PLATELET
Abs Immature Granulocytes: 0.02 10*3/uL (ref 0.00–0.07)
Basophils Absolute: 0.1 10*3/uL (ref 0.0–0.1)
Basophils Relative: 1 %
Eosinophils Absolute: 0.2 10*3/uL (ref 0.0–0.5)
Eosinophils Relative: 3 %
HCT: 39.7 % (ref 39.0–52.0)
Hemoglobin: 12.1 g/dL — ABNORMAL LOW (ref 13.0–17.0)
Immature Granulocytes: 0 %
Lymphocytes Relative: 27 %
Lymphs Abs: 1.5 10*3/uL (ref 0.7–4.0)
MCH: 27.8 pg (ref 26.0–34.0)
MCHC: 30.5 g/dL (ref 30.0–36.0)
MCV: 91.3 fL (ref 80.0–100.0)
Monocytes Absolute: 0.6 10*3/uL (ref 0.1–1.0)
Monocytes Relative: 11 %
Neutro Abs: 3.3 10*3/uL (ref 1.7–7.7)
Neutrophils Relative %: 58 %
Platelets: 223 10*3/uL (ref 150–400)
RBC: 4.35 MIL/uL (ref 4.22–5.81)
RDW: 13.4 % (ref 11.5–15.5)
WBC: 5.7 10*3/uL (ref 4.0–10.5)
nRBC: 0 % (ref 0.0–0.2)

## 2020-04-21 LAB — URINALYSIS, ROUTINE W REFLEX MICROSCOPIC
Bilirubin Urine: NEGATIVE
Glucose, UA: NEGATIVE mg/dL
Hgb urine dipstick: NEGATIVE
Ketones, ur: NEGATIVE mg/dL
Nitrite: NEGATIVE
Protein, ur: 100 mg/dL — AB
Specific Gravity, Urine: 1.014 (ref 1.005–1.030)
pH: 7 (ref 5.0–8.0)

## 2020-04-21 MED ORDER — FUROSEMIDE 10 MG/ML IJ SOLN
60.0000 mg | Freq: Once | INTRAMUSCULAR | Status: AC
  Administered 2020-04-22: 60 mg via INTRAVENOUS
  Filled 2020-04-21: qty 6

## 2020-04-21 NOTE — ED Notes (Signed)
Pt. High falls risk. Yellow sock and arm band applied. Nurse will continue to monitor.

## 2020-04-21 NOTE — ED Provider Notes (Signed)
[name]   CSN: [id number] Arrival date & time: 04/21/20  2103     History Chief Complaint  Patient presents with  . Weakness    Tim Bradley is a 69 y.o. male.  HPI   69 year old male with generalized weakness heaviness in his legs and increasing edema.  He was actually just admitted a week ago for the same complaints.  He ended up leaving [place] patient had to tend to some domestic issues in his home.  His symptoms have progressively worsened again.  He reports compliance with his medications though.  He continues to drink beer although he states that he has cut back.  Still smokes.  Denies any recent drug use.  Reports that he fell once yesterday patient legs were very heavy and he was having difficulty walking.  He denies any 7 injury from this fall though.  Past Medical History:  Diagnosis Date  . Abscess 02/2017   LEFT GLUTEAL   . Acid reflux   . Alcohol abuse   . Anemia   . CHF (congestive heart failure) ([place])   . 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 Cocaine abuse ([place])   . DDD (degenerative disc disease), lumbar   . Diabetes mellitus without complication ([place])   . Dyspnea   . Enlarged prostate   . Hypertension   . Spinal stenosis, lumbar     Patient Active Problem List   Diagnosis Date Noted  . Essential hypertension 04/11/2020  . Leg edema 04/11/2020  . Dyspnea 04/11/2020  . Acute CHF (congestive heart failure) ([place]) 04/11/2020  . BPH (benign prostatic hyperplasia) 04/11/2020  . 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 Generalized weakness 04/11/2020  . Seizure ([place]) 07/21/2019  . Cocaine abuse ([place])   . Constipation 08/22/2018  . Gastritis 08/20/2018  . Heme positive stool   . Acute blood loss anemia secondary to massive gastric ulcer   . Hyponatremia   . Abscess, gluteal, left 03/26/2017  . Type 2 diabetes  mellitus ([place]) 03/26/2017  . Adrenal nodule ([place]) 03/26/2017  . Closed fracture of body of thoracic vertebra ([place]) 03/26/2017  . Spinal stenosis of lumbar region 03/26/2017    Past Surgical History:  Procedure Laterality Date  . BACK SURGERY     cervical x2  . BIOPSY  08/21/2018   Procedure: BIOPSY;  Surgeon: Ronald Lobo, MD;  Location: [facility];  Service: Endoscopy;;  . ESOPHAGOGASTRODUODENOSCOPY (EGD) WITH PROPOFOL N/A 08/21/2018   Procedure: ESOPHAGOGASTRODUODENOSCOPY (EGD) WITH PROPOFOL;  Surgeon: Ronald Lobo, MD;  Location: [facility];  Service: Endoscopy;  Laterality: N/A;  . INCISION AND DRAINAGE PERIRECTAL ABSCESS N/A 03/26/2017   Procedure: IRRIGATION AND DEBRIDEMENT PERIRECTAL ABSCESS;  Surgeon: Rolm Bookbinder, MD;  Location: [facility];  Service: General;  Laterality: N/A;       Family History  Problem Relation Age of Onset  . Hypertension Mother   . Cancer - Lung Father     Social History   Tobacco Use  . Smoking status: Current Every Day Smoker    Packs/day: 0.25    Types: Cigarettes  . Smokeless tobacco: Never Used  Vaping Use  . Vaping Use: Never used  Substance Use Topics  . Alcohol use: Yes    Alcohol/week: 12.0 standard drinks    Types: 12 Cans of beer per week    Comment: i can of beer daily  . Drug  use: Yes    Types: Cocaine, Marijuana    Comment: 03/2020     Home Medications Prior to Admission medications   Medication Sig Start Date End Date Taking? Authorizing Provider  aspirin EC 81 MG EC tablet Take 1 tablet (81 mg total) by mouth daily. Swallow whole. 04/14/20   Raiford Noble Latif, DO  carvedilol (COREG) 3.125 MG tablet Take 1 tablet (3.125 mg total) by mouth 2 (two) times daily with a meal. 04/13/20   Sheikh, Omair Latif, DO  furosemide (LASIX) 40 MG tablet Take 1 tablet (40 mg total) by mouth daily. 04/11/20   Larene Pickett, PA-C  isosorbide-hydrALAZINE (BIDIL) 20-37.5 MG tablet Take 1 tablet by mouth 3 (three) times daily.  04/13/20   Raiford Noble Latif, DO  levETIRAcetam (KEPPRA) 500 MG tablet Take 1 tablet (500 mg total) by mouth 2 (two) times daily. 03/19/20   Corena Herter, PA-C  tamsulosin (FLOMAX) 0.4 MG CAPS capsule Take 1 capsule (0.4 mg total) by mouth daily after supper. 01/27/20   Deno Etienne, DO    Allergies    Patient has no known allergies.  Review of Systems   Review of Systems All systems reviewed and negative, other than as noted in HPI.  Physical Exam Updated Vital Signs BP (!) 150/85 (BP Location: Left Arm)   Pulse 85   Temp 99.2 F (37.3 C) (Oral)   Resp 18   Ht 6\' 2"  (1.88 m)   Wt 90.7 kg   SpO2 95%   BMI 25.68 kg/m   Physical Exam Vitals and nursing note reviewed.  Constitutional:      General: He is not in acute distress.    Appearance: He is well-developed.  HENT:     Head: Normocephalic and atraumatic.  Eyes:     General:        Right eye: No discharge.        Left eye: No discharge.     Conjunctiva/sclera: Conjunctivae normal.  Cardiovascular:     Rate and Rhythm: Normal rate and regular rhythm.     Heart sounds: Normal heart sounds. No murmur heard.  No friction rub. No gallop.   Pulmonary:     Effort: Pulmonary effort is normal. No respiratory distress.     Breath sounds: Normal breath sounds.  Abdominal:     General: There is no distension.     Palpations: Abdomen is soft.     Tenderness: There is no abdominal tenderness.  Musculoskeletal:        General: No tenderness.     Cervical back: Neck supple.     Right lower leg: Edema present.     Left lower leg: Edema present.  Skin:    General: Skin is warm and dry.  Neurological:     Mental Status: He is alert.  Psychiatric:        Behavior: Behavior normal.        Thought Content: Thought content normal.     ED Results / Procedures / Treatments   Labs (all labs ordered are listed, but only abnormal results are displayed) Labs Reviewed  URINALYSIS, ROUTINE W REFLEX MICROSCOPIC - Abnormal;  Notable for the following components:      Result Value   Protein, ur 100 (*)    Leukocytes,Ua SMALL (*)    Bacteria, UA RARE (*)    All other components within normal limits  CBC WITH DIFFERENTIAL/PLATELET - Abnormal; Notable for the following components:   Hemoglobin 12.1 (*)  All other components within normal limits  CBC WITH DIFFERENTIAL/PLATELET  BASIC METABOLIC PANEL  BRAIN NATRIURETIC PEPTIDE    EKG EKG Interpretation  Date/Time:  Saturday April 21 2020 22:53:29 EDT Ventricular Rate:  72 PR Interval:  146 QRS Duration: 148 QT Interval:  456 QTC Calculation: 499 R Axis:   -66 Text Interpretation: Normal sinus rhythm Possible Left atrial enlargement Left axis deviation Left bundle branch block Abnormal ECG No significant change since last tracing Confirmed by Orpah Greek [phone]) on 04/21/2020 11:16:06 PM   Radiology No results found.  Procedures Procedures (including critical care time)  Medications Ordered in ED Medications - No data to display  ED Course  I have reviewed the triage vital signs and the nursing notes.  Pertinent labs & imaging results that were available during my care of the patient were reviewed by me and considered in my medical decision making (see chart for details).    MDM Rules/Calculators/A&P                          69 year old male with generalized weakness and increasing peripheral edema.  Recent admission for the same that he left AGAINST MEDICAL ADVICE.  His volume overloaded.  Lasix given.  Increasing difficulty walking and needing significant assistance.  This point I unfortunately think he needs to be admitted back to the hospital.  He may benefit from placement when he is more medically optimized though.  Final Clinical Impression(s) / ED Diagnoses Final diagnoses:  Acute on chronic congestive heart failure, unspecified heart failure type [facility])    Rx / DC Orders ED Discharge Orders    None       Virgel Manifold, MD 04/23/20 2313

## 2020-04-21 NOTE — ED Triage Notes (Signed)
Pt. From home, transported [facility] c/o decrease mobility and difficulty with ambulation. Pt. Was admitted for lower extremity and abd edema but checked out AMA. Pt. Currently denies SOB, n/v/d .

## 2020-04-22 ENCOUNTER — Emergency Department (HOSPITAL_COMMUNITY): Payer: No Typology Code available for payment source

## 2020-04-22 DIAGNOSIS — I5023 Acute on chronic systolic (congestive) heart failure: Secondary | ICD-10-CM | POA: Insufficient documentation

## 2020-04-22 DIAGNOSIS — F17210 Nicotine dependence, cigarettes, uncomplicated: Secondary | ICD-10-CM | POA: Diagnosis present

## 2020-04-22 DIAGNOSIS — Z91120 Patient's intentional underdosing of medication regimen due to financial hardship: Secondary | ICD-10-CM | POA: Diagnosis not present

## 2020-04-22 DIAGNOSIS — Z801 Family history of malignant neoplasm of trachea, bronchus and lung: Secondary | ICD-10-CM | POA: Diagnosis not present

## 2020-04-22 DIAGNOSIS — I509 Heart failure, unspecified: Secondary | ICD-10-CM | POA: Diagnosis not present

## 2020-04-22 DIAGNOSIS — Z7982 Long term (current) use of aspirin: Secondary | ICD-10-CM | POA: Diagnosis not present

## 2020-04-22 DIAGNOSIS — Z8249 Family history of ischemic heart disease and other diseases of the circulatory system: Secondary | ICD-10-CM | POA: Diagnosis not present

## 2020-04-22 DIAGNOSIS — T501X6A Underdosing of loop [high-ceiling] diuretics, initial encounter: Secondary | ICD-10-CM | POA: Diagnosis present

## 2020-04-22 DIAGNOSIS — Z950 Presence of cardiac pacemaker: Secondary | ICD-10-CM | POA: Diagnosis not present

## 2020-04-22 DIAGNOSIS — I5043 Acute on chronic combined systolic (congestive) and diastolic (congestive) heart failure: Secondary | ICD-10-CM

## 2020-04-22 DIAGNOSIS — Z79899 Other long term (current) drug therapy: Secondary | ICD-10-CM | POA: Diagnosis not present

## 2020-04-22 DIAGNOSIS — E1122 Type 2 diabetes mellitus with diabetic chronic kidney disease: Secondary | ICD-10-CM | POA: Diagnosis present

## 2020-04-22 DIAGNOSIS — I10 Essential (primary) hypertension: Secondary | ICD-10-CM | POA: Diagnosis not present

## 2020-04-22 DIAGNOSIS — R262 Difficulty in walking, not elsewhere classified: Secondary | ICD-10-CM | POA: Diagnosis present

## 2020-04-22 DIAGNOSIS — N179 Acute kidney failure, unspecified: Secondary | ICD-10-CM | POA: Diagnosis present

## 2020-04-22 DIAGNOSIS — G40909 Epilepsy, unspecified, not intractable, without status epilepticus: Secondary | ICD-10-CM | POA: Diagnosis present

## 2020-04-22 DIAGNOSIS — N184 Chronic kidney disease, stage 4 (severe): Secondary | ICD-10-CM | POA: Diagnosis not present

## 2020-04-22 DIAGNOSIS — Z20822 Contact with and (suspected) exposure to covid-19: Secondary | ICD-10-CM | POA: Diagnosis present

## 2020-04-22 DIAGNOSIS — I5041 Acute combined systolic (congestive) and diastolic (congestive) heart failure: Secondary | ICD-10-CM | POA: Insufficient documentation

## 2020-04-22 DIAGNOSIS — I130 Hypertensive heart and chronic kidney disease with heart failure and stage 1 through stage 4 chronic kidney disease, or unspecified chronic kidney disease: Secondary | ICD-10-CM | POA: Diagnosis present

## 2020-04-22 DIAGNOSIS — R531 Weakness: Secondary | ICD-10-CM | POA: Diagnosis not present

## 2020-04-22 DIAGNOSIS — J9601 Acute respiratory failure with hypoxia: Secondary | ICD-10-CM | POA: Diagnosis present

## 2020-04-22 DIAGNOSIS — N400 Enlarged prostate without lower urinary tract symptoms: Secondary | ICD-10-CM | POA: Diagnosis not present

## 2020-04-22 DIAGNOSIS — Z9181 History of falling: Secondary | ICD-10-CM | POA: Diagnosis not present

## 2020-04-22 LAB — BASIC METABOLIC PANEL
Anion gap: 8 (ref 5–15)
Anion gap: 8 (ref 5–15)
BUN: 38 mg/dL — ABNORMAL HIGH (ref 8–23)
BUN: 37 mg/dL — ABNORMAL HIGH (ref 8–23)
CO2: 25 mmol/L (ref 22–32)
CO2: 26 mmol/L (ref 22–32)
Calcium: 8.9 mg/dL (ref 8.9–10.3)
Calcium: 9 mg/dL (ref 8.9–10.3)
Chloride: 107 mmol/L (ref 98–111)
Chloride: 108 mmol/L (ref 98–111)
Creatinine, Ser: 2.11 mg/dL — ABNORMAL HIGH (ref 0.61–1.24)
Creatinine, Ser: 2.24 mg/dL — ABNORMAL HIGH (ref 0.61–1.24)
GFR calc Af Amer: 35 mL/min — ABNORMAL LOW (ref >60)
GFR calc Af Amer: 33 mL/min — ABNORMAL LOW (ref >60)
GFR calc non Af Amer: 31 mL/min — ABNORMAL LOW (ref >60)
GFR calc non Af Amer: 28 mL/min — ABNORMAL LOW (ref >60)
Glucose, Bld: 97 mg/dL (ref 70–99)
Glucose, Bld: 117 mg/dL — ABNORMAL HIGH (ref 70–99)
Potassium: 4.3 mmol/L (ref 3.5–5.1)
Potassium: 4.2 mmol/L (ref 3.5–5.1)
Sodium: 140 mmol/L (ref 135–145)
Sodium: 142 mmol/L (ref 135–145)

## 2020-04-22 LAB — CBC
HCT: 39.9 % (ref 39.0–52.0)
Hemoglobin: 12.1 g/dL — ABNORMAL LOW (ref 13.0–17.0)
MCH: 28.1 pg (ref 26.0–34.0)
MCHC: 30.3 g/dL (ref 30.0–36.0)
MCV: 92.8 fL (ref 80.0–100.0)
Platelets: 239 10*3/uL (ref 150–400)
RBC: 4.3 MIL/uL (ref 4.22–5.81)
RDW: 13.4 % (ref 11.5–15.5)
WBC: 6.2 10*3/uL (ref 4.0–10.5)
nRBC: 0 % (ref 0.0–0.2)

## 2020-04-22 LAB — BRAIN NATRIURETIC PEPTIDE: B Natriuretic Peptide: 924.3 pg/mL — ABNORMAL HIGH (ref 0.0–100.0)

## 2020-04-22 LAB — TROPONIN I (HIGH SENSITIVITY)
Troponin I (High Sensitivity): 16 ng/L (ref <18)
Troponin I (High Sensitivity): 12 ng/L (ref <18)

## 2020-04-22 LAB — HEPATIC FUNCTION PANEL
ALT: 18 U/L (ref 0–44)
AST: 18 U/L (ref 15–41)
Albumin: 2.9 g/dL — ABNORMAL LOW (ref 3.5–5.0)
Alkaline Phosphatase: 53 U/L (ref 38–126)
Bilirubin, Direct: <0.1 mg/dL (ref 0.0–0.2)
Total Bilirubin: 0.6 mg/dL (ref 0.3–1.2)
Total Protein: 6 g/dL — ABNORMAL LOW (ref 6.5–8.1)

## 2020-04-22 LAB — RAPID URINE DRUG SCREEN, HOSP PERFORMED
Amphetamines: NOT DETECTED
Barbiturates: NOT DETECTED
Benzodiazepines: NOT DETECTED
Cocaine: POSITIVE — AB
Opiates: NOT DETECTED
Tetrahydrocannabinol: NOT DETECTED

## 2020-04-22 LAB — SARS CORONAVIRUS 2 BY RT PCR (HOSPITAL ORDER, PERFORMED IN ~~LOC~~ HOSPITAL LAB): SARS Coronavirus 2: NEGATIVE

## 2020-04-22 LAB — PROCALCITONIN: Procalcitonin: <0.1 ng/mL

## 2020-04-22 LAB — MAGNESIUM: Magnesium: 1.9 mg/dL (ref 1.7–2.4)

## 2020-04-22 LAB — HEMOGLOBIN A1C
Hgb A1c MFr Bld: 5.9 % — ABNORMAL HIGH (ref 4.8–5.6)
Mean Plasma Glucose: 122.63 mg/dL

## 2020-04-22 LAB — ETHANOL: Alcohol, Ethyl (B): <10 mg/dL (ref <10)

## 2020-04-22 LAB — STREP PNEUMONIAE URINARY ANTIGEN: Strep Pneumo Urinary Antigen: NEGATIVE

## 2020-04-22 MED ORDER — TAMSULOSIN HCL 0.4 MG PO CAPS
0.4000 mg | ORAL_CAPSULE | Freq: Every day | ORAL | Status: DC
  Administered 2020-04-22 – 2020-04-25 (×4): 0.4 mg via ORAL
  Filled 2020-04-22 (×4): qty 1

## 2020-04-22 MED ORDER — ENOXAPARIN SODIUM 40 MG/0.4ML ~~LOC~~ SOLN
40.0000 mg | SUBCUTANEOUS | Status: DC
  Administered 2020-04-22 – 2020-04-23 (×2): 40 mg via SUBCUTANEOUS
  Filled 2020-04-22 (×2): qty 0.4

## 2020-04-22 MED ORDER — LEVETIRACETAM 500 MG PO TABS
500.0000 mg | ORAL_TABLET | Freq: Two times a day (BID) | ORAL | Status: DC
  Administered 2020-04-22 – 2020-04-26 (×10): 500 mg via ORAL
  Filled 2020-04-22 (×10): qty 1

## 2020-04-22 MED ORDER — SODIUM CHLORIDE 0.9 % IV SOLN
500.0000 mg | Freq: Once | INTRAVENOUS | Status: AC
  Administered 2020-04-22: 500 mg via INTRAVENOUS
  Filled 2020-04-22: qty 500

## 2020-04-22 MED ORDER — SODIUM CHLORIDE 0.9 % IV SOLN
1.0000 g | Freq: Once | INTRAVENOUS | Status: AC
  Administered 2020-04-22: 1 g via INTRAVENOUS
  Filled 2020-04-22: qty 10

## 2020-04-22 MED ORDER — CARVEDILOL 3.125 MG PO TABS
3.1250 mg | ORAL_TABLET | Freq: Two times a day (BID) | ORAL | Status: DC
  Administered 2020-04-22 – 2020-04-26 (×9): 3.125 mg via ORAL
  Filled 2020-04-22 (×10): qty 1

## 2020-04-22 MED ORDER — ASPIRIN 81 MG PO CHEW
81.0000 mg | CHEWABLE_TABLET | Freq: Every day | ORAL | Status: DC
  Administered 2020-04-22 – 2020-04-26 (×5): 81 mg via ORAL
  Filled 2020-04-22 (×5): qty 1

## 2020-04-22 MED ORDER — ACETAMINOPHEN 325 MG PO TABS
650.0000 mg | ORAL_TABLET | Freq: Four times a day (QID) | ORAL | Status: DC | PRN
  Administered 2020-04-22 – 2020-04-26 (×5): 650 mg via ORAL
  Filled 2020-04-22 (×5): qty 2

## 2020-04-22 MED ORDER — ISOSORB DINITRATE-HYDRALAZINE 20-37.5 MG PO TABS
1.0000 | ORAL_TABLET | Freq: Three times a day (TID) | ORAL | Status: DC
  Administered 2020-04-22 – 2020-04-26 (×13): 1 via ORAL
  Filled 2020-04-22 (×15): qty 1

## 2020-04-22 MED ORDER — FUROSEMIDE 10 MG/ML IJ SOLN
40.0000 mg | Freq: Two times a day (BID) | INTRAMUSCULAR | Status: AC
  Administered 2020-04-22 (×2): 40 mg via INTRAVENOUS
  Filled 2020-04-22 (×2): qty 4

## 2020-04-22 NOTE — ED Notes (Signed)
Pt assisted to restroom to have BM. Max assist by this RN.

## 2020-04-22 NOTE — ED Notes (Signed)
Sent pharmacy message to received 1000 medications

## 2020-04-22 NOTE — ED Notes (Signed)
Tele   Breakfast ordered  

## 2020-04-22 NOTE — Progress Notes (Signed)
Per HPI: Tim Bradley is a 69 y.o. male with PMH of HTN, CKD, BPH, seizure disorder and CHF who presented to ED with weakness and leg swelling and admitted for CHF exacerbation and placement (left [facility] 7/16).  Patient reports that he returned tonight due to leg pain and worsening LE edema. Since leaving AMA on 7/16, reports progressive worsening. He was unable to pick up his medications as he reports they were too expensive. Denies other complaints at this time. Unsure about weight gain. Reports smoking about 1 pack of cigarettes weekly and drinks a beer maybe every other day; last drink 5-6 days ago. Reports falling once yesterday because his legs felt heavy and that he has had trouble walking in general. Denies headache, dizziness, fever, chills, cough, SOB, chest pain, abdominal pain, nausea, vomiting, diarrhea, constipation, dysuria, hematuria, hematochezia, melena, speech difficulty, trouble eating, confusion or any other complaints.  -Patient admitted after midnight with acute systolic and diastolic CHF in setting of severe LV dysfunction of 20%.  He appears to be diuresing quite well and is noted to have over 4 L of output does not appear to be in any further distress upon my evaluation at bedside.  Continue IV Lasix as ordered with repeat labs pending in a.m.  He was noted to have possible pneumonia on chest x-ray and was given one dose of Rocephin and azithromycin in ED, but procalcitonin is negative and he does not clinically appear to have pneumonia.  PT has evaluated the patient with recommendations for SNF and he will require placement which he appears agreeable to at the moment.  Total care time: 30 minutes.

## 2020-04-22 NOTE — ED Notes (Signed)
PT at bedside to work with patient.

## 2020-04-22 NOTE — ED Notes (Signed)
Pt. Refused COVID test. Nurse will reassess.

## 2020-04-22 NOTE — ED Notes (Signed)
Pt. Transported to xray 

## 2020-04-22 NOTE — Evaluation (Signed)
Physical Therapy Evaluation Patient Details Name: Tim Bradley MRN: [id number] DOB: 1950/10/27 Today's Date: 04/22/2020   History of Present Illness  Pt is a 69 y/o male admitted secondary to increased LE swelling and fall. Thought to be secondary to CHF. PMH includes HTN, CKD, seizures, CHF, DM, substance abuse, alcohol abuse and back surgery.   Clinical Impression  Pt admitted secondary to problem above with deficits below. Pt requiring min A to roll for clean up. Pt on stretcher in ED and has increased weakness and inability to ambulate, therefore further mobility unsafe to attempt with +1. Pt has had increased difficulty with transfers and performing ADLs, and feel he would benefit from SNF level therapies. Will continue to follow acutely to maximize functional mobility independence and safety.     Follow Up Recommendations SNF;Supervision/Assistance - 24 hour    Equipment Recommendations  None recommended by PT    Recommendations for Other Services       Precautions / Restrictions Precautions Precautions: Fall Restrictions Weight Bearing Restrictions: No      Mobility  Bed Mobility Overal bed mobility: Needs Assistance Bed Mobility: Rolling Rolling: Min assist         General bed mobility comments: Bed soiled, so session focused on rolling for clean up. Pt requiring min A to roll and pt required use of bed rails. Unsafe to attempt OOB mobility with +1 assist from higher stretcher height in ED.   Transfers                    Ambulation/Gait                Stairs            Wheelchair Mobility    Modified Rankin (Stroke Patients Only)       Balance                                             Pertinent Vitals/Pain Pain Assessment: Faces Faces Pain Scale: Hurts even more Pain Location: BLE and back  Pain Descriptors / Indicators: Aching;Grimacing;Guarding Pain Intervention(s): Monitored during session;Limited  activity within patient's tolerance;Repositioned    Home Living Family/patient expects to be discharged to:: Private residence Living Arrangements: Non-relatives/Friends Available Help at Discharge: Friend(s);Available PRN/intermittently Type of Home: House Home Access: Stairs to enter Entrance Stairs-Rails: None Entrance Stairs-Number of Steps: 2 Home Layout: One level Home Equipment: Walker - 2 wheels;Cane - single point;Wheelchair - manual      Prior Function Level of Independence: Independent with assistive device(s)         Comments: Reports he uses WC. Has been sliding into WC and has been unable to stand. Reports every time he stands he falls. Reports he has been using a cup to urinate in and has bowel movement on the floor or in his WC and then cleans himself up.      Hand Dominance        Extremity/Trunk Assessment   Upper Extremity Assessment Upper Extremity Assessment: Generalized weakness    Lower Extremity Assessment Lower Extremity Assessment: RLE deficits/detail;LLE deficits/detail RLE Deficits / Details: Able to perform heel slide. Notable extensor lag when performing SLR. Able to perform ankle pump LLE Deficits / Details: Able to perform heel slide. Notable extensor lag when performing SLR. Able to perform ankle pump. Increased pain with movement.  Cervical / Trunk Assessment Cervical / Trunk Assessment: Normal  Communication   Communication: No difficulties  Cognition Arousal/Alertness: Awake/alert Behavior During Therapy: WFL for tasks assessed/performed Overall Cognitive Status: No family/caregiver present to determine baseline cognitive functioning                                        General Comments      Exercises     Assessment/Plan    PT Assessment Patient needs continued PT services  PT Problem List Decreased strength;Decreased range of motion;Decreased activity tolerance;Decreased balance;Decreased  mobility;Decreased cognition;Decreased safety awareness;Cardiopulmonary status limiting activity       PT Treatment Interventions DME instruction;Functional mobility training;Therapeutic activities;Therapeutic exercise;Balance training;Patient/family education    PT Goals (Current goals can be found in the Care Plan section)  Acute Rehab PT Goals Patient Stated Goal: to get stronger PT Goal Formulation: With patient Time For Goal Achievement: 05/06/20 Potential to Achieve Goals: Fair    Frequency Min 2X/week   Barriers to discharge Inaccessible home environment;Decreased caregiver support      Co-evaluation               AM-PAC PT "6 Clicks" Mobility  Outcome Measure Help needed turning from your back to your side while in a flat bed without using bedrails?: A Little Help needed moving from lying on your back to sitting on the side of a flat bed without using bedrails?: A Little Help needed moving to and from a bed to a chair (including a wheelchair)?: A Lot Help needed standing up from a chair using your arms (e.g., wheelchair or bedside chair)?: Total Help needed to walk in hospital room?: Total Help needed climbing 3-5 steps with a railing? : Total 6 Click Score: 11    End of Session Equipment Utilized During Treatment: Gait belt Activity Tolerance: Patient tolerated treatment well Patient left: in bed;with call bell/phone within reach (on stretcher) Nurse Communication: Mobility status PT Visit Diagnosis: Unsteadiness on feet (R26.81);Muscle weakness (generalized) (M62.81);History of falling (Z91.81);Repeated falls (R29.6)    Time: 1660-6301 PT Time Calculation (min) (ACUTE ONLY): 19 min   Charges:   PT Evaluation $PT Eval Moderate Complexity: 1 Mod          Reuel Derby, PT, DPT  Acute Rehabilitation Services  Pager: [phone] Office: [phone]   Rudean Hitt 04/22/2020, 8:41 AM

## 2020-04-22 NOTE — ED Notes (Signed)
2 gram sodium lunch tray ordered 

## 2020-04-22 NOTE — ED Notes (Addendum)
Pt. Still refusing COVID test

## 2020-04-22 NOTE — H&P (Addendum)
[facility] History and Physical  Tim Bradley WUX:[id number] DOB: [birth year] DOA: 04/21/2020  Referring EDP: Wilson Singer PCP: [facility]   Chief Complaint: Weakness, Leg pain  HPI: Tim Bradley is a 69 y.o. male with PMH of HTN, CKD, BPH, seizure disorder and CHF who presented to ED with weakness and leg swelling and admitted for CHF exacerbation and placement (left [facility] 7/16).  Patient reports that he returned tonight due to leg pain and worsening LE edema. Since leaving AMA on 7/16, reports progressive worsening. He was unable to pick up his medications as he reports they were too expensive. Denies other complaints at this time. Unsure about weight gain. Reports smoking about 1 pack of cigarettes weekly and drinks a beer maybe every other day; last drink 5-6 days ago. Reports falling once yesterday because his legs felt heavy and that he has had trouble walking in general. Denies headache, dizziness, fever, chills, cough, SOB, chest pain, abdominal pain, nausea, vomiting, diarrhea, constipation, dysuria, hematuria, hematochezia, melena, speech difficulty, trouble eating, confusion or any other complaints.  In the ED: Vitals stable on room air. Labs remarkable for UA with small leuks, Cr 2.11, CBC WNL, BNP 924.  CXR:  1. Right perihilar and infrahilar airspace opacity concerning for pneumonia. Follow-up to radiologic resolution is recommended. 2. Mild vascular congestion without overt pulmonary edema.  Patient was given 60 mg IV lasix and Rocephin/Azithro ordered for possible PNA. Admission requested for CHF exacerbation and likely placement.   Review of Systems:  All other systems negative unless noted above in HPI.   Past Medical History:  Diagnosis Date  . Abscess 02/2017   LEFT GLUTEAL   . Acid reflux   . Alcohol abuse   . Anemia   . CHF (congestive heart failure) ([place])   . 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 Cocaine abuse ([place])    . DDD (degenerative disc disease), lumbar   . Diabetes mellitus without complication ([place])   . Dyspnea   . Enlarged prostate   . Hypertension   . Spinal stenosis, lumbar    Past Surgical History:  Procedure Laterality Date  . BACK SURGERY     cervical x2  . BIOPSY  08/21/2018   Procedure: BIOPSY;  Surgeon: Ronald Lobo, MD;  Location: [place];  Service: Endoscopy;;  . ESOPHAGOGASTRODUODENOSCOPY (EGD) WITH PROPOFOL N/A 08/21/2018   Procedure: ESOPHAGOGASTRODUODENOSCOPY (EGD) WITH PROPOFOL;  Surgeon: Ronald Lobo, MD;  Location: [place];  Service: Endoscopy;  Laterality: N/A;  . INCISION AND DRAINAGE PERIRECTAL ABSCESS N/A 03/26/2017   Procedure: IRRIGATION AND DEBRIDEMENT PERIRECTAL ABSCESS;  Surgeon: Rolm Bookbinder, MD;  Location: [place];  Service: General;  Laterality: N/A;   Social History:  reports that he has been smoking cigarettes. He has been smoking about 0.25 packs per day. He has never used smokeless tobacco. He reports current alcohol use of about 12.0 standard drinks of alcohol per week. He reports current drug use. Drugs: Cocaine and Marijuana.  No Known Allergies  Family History  Problem Relation Age of Onset  . Hypertension Mother   . Cancer - Lung Father      Prior to Admission medications   Medication Sig Start Date End Date Taking? Authorizing Provider  aspirin EC 81 MG EC tablet Take 1 tablet (81 mg total) by mouth daily. Swallow whole. 04/14/20   Raiford Noble Latif, DO  carvedilol (COREG) 3.125 MG tablet Take 1 tablet (3.125 mg total) by mouth 2 (two)  times daily with a meal. 04/13/20   Sheikh, Georgina Quint Latif, DO  furosemide (LASIX) 40 MG tablet Take 1 tablet (40 mg total) by mouth daily. 04/11/20   Larene Pickett, PA-C  isosorbide-hydrALAZINE (BIDIL) 20-37.5 MG tablet Take 1 tablet by mouth 3 (three) times daily. 04/13/20   Raiford Noble Latif, DO  levETIRAcetam (KEPPRA) 500 MG tablet Take 1 tablet (500 mg total) by mouth 2 (two) times daily.  03/19/20   Corena Herter, PA-C  tamsulosin (FLOMAX) 0.4 MG CAPS capsule Take 1 capsule (0.4 mg total) by mouth daily after supper. 01/27/20   Deno Etienne, DO   Physical Exam: Vitals:   04/21/20 2105 04/21/20 2110 04/21/20 2113  BP: (!) 150/85 (!) 150/85   Pulse: 85 85   Resp: 18 18   Temp: 99.2 F (37.3 C)    TempSrc: Oral    SpO2: 95% 95%   Weight:   90.7 kg  Height:   6\' 2"  (1.88 m)    Wt Readings from Last 3 Encounters:  04/21/20 90.7 kg  04/13/20 96.2 kg  01/27/20 94.3 kg    . General:  Appears calm and comfortable. AAOx4.  . Eyes: EOMI, normal lids, irises & conjunctiva . ENT: grossly normal hearing, lips & tongue . Neck: normal ROM . Cardiovascular: RRR, no m/r/g. 2+ LE pitting edema bilaterally. Marland Kitchen Respiratory: Scarce crackles but otherwise CTAB. Normal respiratory effort. . Abdomen: soft, ntnd . Skin: no rash or induration seen on limited exam . Musculoskeletal: grossly normal tone BUE/BLE . Psychiatric: grossly normal mood and affect, speech fluent and appropriate . Neurologic: grossly non-focal.          Labs on Admission:  Basic Metabolic Panel: Recent Labs  Lab 04/21/20 2328  NA 140  K 4.3  CL 107  CO2 25  GLUCOSE 97  BUN 38*  CREATININE 2.11*  CALCIUM 8.9   Liver Function Tests: No results for input(s): AST, ALT, ALKPHOS, BILITOT, PROT, ALBUMIN in the last 168 hours. No results for input(s): LIPASE, AMYLASE in the last 168 hours. No results for input(s): AMMONIA in the last 168 hours. CBC: Recent Labs  Lab 04/21/20 2328  WBC 5.7  NEUTROABS 3.3  HGB 12.1*  HCT 39.7  MCV 91.3  PLT 223   Cardiac Enzymes: No results for input(s): CKTOTAL, CKMB, CKMBINDEX, TROPONINI in the last 168 hours.  BNP (last 3 results) Recent Labs    04/10/20 2234 04/21/20 2328  BNP 1,366.8* 924.3*    ProBNP (last 3 results) No results for input(s): PROBNP in the last 8760 hours.  CBG: No results for input(s): GLUCAP in the last 168 hours.  Radiological  Exams on Admission: DG Chest Portable 1 View  Result Date: 04/22/2020 CLINICAL DATA:  Dyspnea EXAM: PORTABLE CHEST 1 VIEW COMPARISON:  April 13, 2020 FINDINGS: The heart size is mildly enlarged. There are aortic calcifications. There is a right perihilar and infrahilar airspace opacity. There is no pneumothorax. Mild vascular congestion is noted without evidence for large pleural effusion. There are old healed left-sided rib fractures. IMPRESSION: 1. Right perihilar and infrahilar airspace opacity concerning for pneumonia. Follow-up to radiologic resolution is recommended. 2. Mild vascular congestion without overt pulmonary edema. Electronically Signed   By: Constance Holster M.D.   On: 04/22/2020 01:51    EKG: Independently reviewed. HR 70. Sinus rhythm. QTc 499. No STEMI. LBBB. No significant change when compared to 7/14.  Assessment/Plan Principal Problem:   Acute exacerbation of CHF (congestive heart failure) (HCC) Active Problems:  Seizure ([place])   Essential hypertension   BPH (benign prostatic hyperplasia)   CKD (chronic kidney disease) stage 4, GFR 15-29 ml/min (HCC)   Generalized weakness  69 y.o. male with PMH of HTN, CKD, BPH, seizure disorder and CHF who presented to ED with weakness and leg swelling and admitted for CHF exacerbation and placement (left [facility] 7/16).  AcuteSystolic and Grade 1 DiastolicCHFwith EF of <67% LE edema  -presents with LE edema, weakness and hx of falls -currently on room air and lungs largely CTAB -left AMA on 7/16 after admission for CHF exacerbation  -BNP 924 -S/p 60 mg IV Lasix in ED; home dose is Lasix 40 mg daily although has not been taking due to cost per patieht  -Lasix 40 mg IV BID ordered x2 days and then adjust accordingly -Strict I's andO's. Monitor daily weight. -Last Echo 7/14 with EF < 20%, Grade I diastolic dysfunction and aortic dilatation noted  -Tele -cont home Coreg and Bidil (denies drug use but UDS ordered due to Coreg  use) -Consult Cards PRN -Daily K, Mag   Possible PNA -CXR with possible opacity -patient on room air and WBC normal; denies cough/SOB -Received Rocephin and Azithro in ED -did not cont abx for now; Procal pending; cont as needed   EssentialHypertension -cont home Bidil and Coreg   AKI on CKD (chronic kidney disease) stage 4, GFR 15-29 ml/min (HCC), worsening Metabolic acidosis -Stable CKD stage IV -Cr improved to 2.11 on admission; previously 2.87 on discharge 7/16 -Avoid nephrotoxic medications, contrast dyes, hypotension and renally dose medications  GeneralizedWeakness -PT/OT previously recommended SNF  -PT/OT re-consulted  -Fall precautions  SeizureDisorder(HCC) -Continue home dose ofLevetiracetam 500 mg po BID. -C/wSeizure precautions  BPH (benign prostatic hyperplasia) -Continue home Tamsulosin  Tobacco useand polysubstance abuse -reports smoking about 1 pack of cigarettes weekly; declines replacement -reports last drink about 5-6 days ago and that he only drinks about 1 beer every other day -Ethanol level and UDS ordered  AAA -Noted on Echo on 7/14 -Cardiology recommended aggressive blood pressure control   Hx T2DM - repeat A1c ordered - glucose 97 on BMP - no meds ordered at this time  Code Status: Full DVT Prophylaxis: Lovenox Family Communication: None Disposition Plan: Admit to inpatient. Patient requiring IV medication. Left AMA on 7/16 and placement was recommended at last admission. Patient is at high risk for further decompensation due to age and co-morbidities. Anticipate discharge in 3-4 days likely to SNF vs rehab.   Time spent: 70 minutes  Chauncey Mann, MD [facility] Pager [phone]

## 2020-04-22 NOTE — ED Notes (Signed)
Breakfast tray provided. 

## 2020-04-23 ENCOUNTER — Encounter (HOSPITAL_COMMUNITY): Payer: Self-pay | Admitting: Family Medicine

## 2020-04-23 DIAGNOSIS — I10 Essential (primary) hypertension: Secondary | ICD-10-CM | POA: Diagnosis not present

## 2020-04-23 DIAGNOSIS — R531 Weakness: Secondary | ICD-10-CM

## 2020-04-23 DIAGNOSIS — N184 Chronic kidney disease, stage 4 (severe): Secondary | ICD-10-CM | POA: Diagnosis not present

## 2020-04-23 DIAGNOSIS — R569 Unspecified convulsions: Secondary | ICD-10-CM

## 2020-04-23 DIAGNOSIS — I5043 Acute on chronic combined systolic (congestive) and diastolic (congestive) heart failure: Secondary | ICD-10-CM | POA: Diagnosis not present

## 2020-04-23 DIAGNOSIS — Z9119 Patient's noncompliance with other medical treatment and regimen: Secondary | ICD-10-CM

## 2020-04-23 LAB — URINE CULTURE

## 2020-04-23 LAB — BLOOD CULTURE ID PANEL (REFLEXED)

## 2020-04-23 LAB — BASIC METABOLIC PANEL
Anion gap: 8 (ref 5–15)
BUN: 50 mg/dL — ABNORMAL HIGH (ref 8–23)
CO2: 29 mmol/L (ref 22–32)
Calcium: 8.8 mg/dL — ABNORMAL LOW (ref 8.9–10.3)
Chloride: 102 mmol/L (ref 98–111)
Creatinine, Ser: 2.97 mg/dL — ABNORMAL HIGH (ref 0.61–1.24)
GFR calc Af Amer: 23 mL/min — ABNORMAL LOW (ref >60)
GFR calc non Af Amer: 20 mL/min — ABNORMAL LOW (ref >60)
Glucose, Bld: 127 mg/dL — ABNORMAL HIGH (ref 70–99)
Potassium: 4.1 mmol/L (ref 3.5–5.1)
Sodium: 139 mmol/L (ref 135–145)

## 2020-04-23 LAB — LEGIONELLA PNEUMOPHILA SEROGP 1 UR AG: L. pneumophila Serogp 1 Ur Ag: NEGATIVE

## 2020-04-23 LAB — CBC
HCT: 39.4 % (ref 39.0–52.0)
Hemoglobin: 12.4 g/dL — ABNORMAL LOW (ref 13.0–17.0)
MCH: 28.4 pg (ref 26.0–34.0)
MCHC: 31.5 g/dL (ref 30.0–36.0)
MCV: 90.2 fL (ref 80.0–100.0)
Platelets: 242 10*3/uL (ref 150–400)
RBC: 4.37 MIL/uL (ref 4.22–5.81)
RDW: 13.3 % (ref 11.5–15.5)
WBC: 6.5 10*3/uL (ref 4.0–10.5)
nRBC: 0 % (ref 0.0–0.2)

## 2020-04-23 LAB — MAGNESIUM: Magnesium: 2 mg/dL (ref 1.7–2.4)

## 2020-04-23 MED ORDER — ENOXAPARIN SODIUM 30 MG/0.3ML ~~LOC~~ SOLN
30.0000 mg | SUBCUTANEOUS | Status: DC
  Administered 2020-04-24 – 2020-04-26 (×3): 30 mg via SUBCUTANEOUS
  Filled 2020-04-23 (×3): qty 0.3

## 2020-04-23 NOTE — Progress Notes (Signed)
PHARMACY - PHYSICIAN COMMUNICATION CRITICAL VALUE ALERT - BLOOD CULTURE IDENTIFICATION (BCID)  Tim Bradley is an 69 y.o. male who presented to [facility] on 04/21/2020 with a chief complaint of difficulty with ambulation.  Assessment:  Started on ABX in ED but felt weakness was d/t CHF exacerbation, procalcitonin negative and clinically inconsistent w/ PNA, now w/ coag-neg staph growing in 1/3 blood cx bottles >> likely contaminant.  Name of physician (or Provider) Contacted: Dr Tonie Griffith  Current antibiotics: None (did receive azithro + Rocephin in ED)  Changes to prescribed antibiotics recommended:  No changes needed unless clinical picture changes.   Wynona Neat, PharmD, BCPS  04/23/2020  2:47 AM

## 2020-04-23 NOTE — Progress Notes (Signed)
PROGRESS NOTE    Tim Bradley  XIP:[id number] DOB: Mar 01, 1951 DOA: 04/21/2020 PCP: [facility]   Brief Narrative:  69 y.o.malewith PMH ofHTN, CKD, BPH, seizure disorder and CHF who presented to ED with weakness and leg swelling and admitted for CHF exacerbation and placement (left AMA 7/16).  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  Class III, improving Medication noncompliance -Hold diuretics today due to rising creatinine.  Patient is now back to room air. -Strict input and output -Supportive care, wean off oxygen -Procalcitonin negative, no need for antibiotics -TOC consulted to help with medications -Chest x-ray shows pulmonary vascular congestion. -Echo 7/14-EF less than 20%, grade 1 DD -Coreg, aspirin, BiDil  Generalized weakness -PT/OT recommending SNF  Essential hypertension -Supposed to be on BiDil and Coreg  Acute kidney injury on CKD stage IV -Baseline creatinine around 2.5.  Diabetes mellitus type 2 -Insulin sliding scale and Accu-Chek. -Hemoglobin A1c 5.9  Tobacco use/polysubstance abuse -Advised to quit smoking cigarettes and advised to quit drinking alcohol. -Monitor for any withdrawal symptoms  History of seizures -Keppra 500 mg twice daily  BPH -Flomax   DVT prophylaxis: Lovenox Code Status:  Family Communication: None at bedside  Status is: Inpatient  Remains inpatient appropriate because:Unsafe d/c  plan   Dispo: The patient is from: Home              Anticipated d/c is to: SNF              Anticipated d/c date is: 1 day              Patient currently is medically stable to d/c.  Will have TOC team start looking for placement for him as recommended by PT.       Body mass index is 26.06 kg/m.     Subjective: Sitting at the side of the bed does not have any complaints, he has been weaned off oxygen.  Overall feels very weak.  Review of Systems Otherwise negative except as per HPI, including: General: Denies fever, chills, night sweats or unintended weight loss. Resp: Denies cough, wheezing, shortness of breath. Cardiac: Denies chest pain, palpitations, orthopnea, paroxysmal nocturnal dyspnea. GI: Denies abdominal pain, nausea, vomiting, diarrhea or constipation GU: Denies dysuria, frequency, hesitancy or incontinence MS: Denies muscle aches, joint pain or swelling Neuro: Denies headache, neurologic deficits (focal weakness, numbness, tingling), abnormal gait Psych: Denies anxiety, depression, SI/HI/AVH Skin: Denies new rashes or lesions ID: Denies sick contacts, exotic exposures, travel  Examination:  General exam: Appears calm and comfortable  Respiratory system: Clear to auscultation. Respiratory effort normal. Cardiovascular system: S1 & S2 heard, RRR. No JVD, murmurs, rubs, gallops or clicks. No pedal edema. Gastrointestinal system: Abdomen is nondistended, soft and nontender. No organomegaly or masses felt. Normal bowel sounds heard. Central nervous system: Alert and oriented. No focal neurological deficits. Extremities: Symmetric 5 x 5 power. Skin: No rashes, lesions or ulcers Psychiatry: Judgement and insight appear normal. Mood & affect  appropriate.     Objective: Vitals:   04/22/20 2045 04/23/20 0005 04/23/20 0439 04/23/20 0800  BP: 94/68 111/67 109/72 (!) 103/58  Pulse: 76 67 79 76  Resp: 20 20 20 18   Temp: 98.4 F (36.9 C)  98.4 F (36.9 C) 98.1 F  (36.7 C)  TempSrc: Oral  Oral Oral  SpO2: 99% 100% 96% 100%  Weight:   (!) 92.1 kg   Height:        Intake/Output Summary (Last 24 hours) at 04/23/2020 0812 Last data filed at 04/23/2020 5188 Gross per 24 hour  Intake 580 ml  Output 500 ml  Net 80 ml   Filed Weights   04/21/20 2113 04/23/20 0439  Weight: 90.7 kg (!) 92.1 kg     Data Reviewed:   CBC: Recent Labs  Lab 04/21/20 2328 04/22/20 0323 04/23/20 0513  WBC 5.7 6.2 6.5  NEUTROABS 3.3  --   --   HGB 12.1* 12.1* 12.4*  HCT 39.7 39.9 39.4  MCV 91.3 92.8 90.2  PLT 223 239 416   Basic Metabolic Panel: Recent Labs  Lab 04/21/20 2328 04/22/20 0323 04/23/20 0513  NA 140 142 139  K 4.3 4.2 4.1  CL 107 108 102  CO2 25 26 29   GLUCOSE 97 117* 127*  BUN 38* 37* 50*  CREATININE 2.11* 2.24* 2.97*  CALCIUM 8.9 9.0 8.8*  MG  --  1.9 2.0   GFR: Estimated Creatinine Clearance: 26.5 mL/min (A) (by C-G formula based on SCr of 2.97 mg/dL (H)). Liver Function Tests: Recent Labs  Lab 04/22/20 0323  AST 18  ALT 18  ALKPHOS 53  BILITOT 0.6  PROT 6.0*  ALBUMIN 2.9*   No results for input(s): LIPASE, AMYLASE in the last 168 hours. No results for input(s): AMMONIA in the last 168 hours. Coagulation Profile: No results for input(s): INR, PROTIME in the last 168 hours. Cardiac Enzymes: No results for input(s): CKTOTAL, CKMB, CKMBINDEX, TROPONINI in the last 168 hours. BNP (last 3 results) No results for input(s): PROBNP in the last 8760 hours. HbA1C: Recent Labs    04/22/20 0323  HGBA1C 5.9*   CBG: No results for input(s): GLUCAP in the last 168 hours. Lipid Profile: No results for input(s): CHOL, HDL, LDLCALC, TRIG, CHOLHDL, LDLDIRECT in the last 72 hours. Thyroid Function Tests: No results for input(s): TSH, T4TOTAL, FREET4, T3FREE, THYROIDAB in the last 72 hours. Anemia Panel: No results for input(s): VITAMINB12, FOLATE, FERRITIN, TIBC, IRON, RETICCTPCT in the last 72 hours. Sepsis Labs: Recent Labs   Lab 04/22/20 0323  PROCALCITON <0.10    Recent Results (from the past 240 hour(s))  Culture, blood (Routine X 2) w Reflex to ID Panel     Status: None (Preliminary result)   Collection Time: 04/22/20  3:33 AM   Specimen: BLOOD  Result Value Ref Range Status   Specimen Description BLOOD LEFT ANTECUBITAL  Final   Special Requests   Final    BOTTLES DRAWN AEROBIC AND ANAEROBIC Blood Culture results may not be optimal due to an inadequate volume of blood received in culture bottles   Culture  Setup Time   Final    GRAM POSITIVE COCCI IN BOTH AEROBIC AND ANAEROBIC BOTTLES Organism ID to follow CRITICAL RESULT CALLED TO, READ BACK BY AND VERIFIED WITH: PHRMD V BRYK @0237  04/23/20 BY S GEZAHEGN Performed at [facility], [place] [address] [place], [place] [address]    Culture PENDING  Incomplete   Report Status PENDING  Incomplete  Blood Culture ID Panel (Reflexed)     Status: Abnormal   Collection Time: 04/22/20  3:33 AM  Result Value Ref Range Status   Enterococcus species NOT DETECTED NOT DETECTED Final   Listeria monocytogenes NOT DETECTED NOT DETECTED Final   Staphylococcus species DETECTED (A) NOT DETECTED Final    Comment: Methicillin (oxacillin) susceptible coagulase negative staphylococcus. Possible blood culture contaminant (unless isolated from more than one blood culture draw or clinical case suggests pathogenicity). No antibiotic treatment is indicated for blood  culture contaminants. CRITICAL RESULT CALLED TO, READ BACK BY AND VERIFIED WITH: PHARMD V BRYK [id number]    Staphylococcus aureus (BCID) NOT DETECTED NOT DETECTED Final   Methicillin resistance NOT DETECTED NOT DETECTED Final   Streptococcus species NOT DETECTED NOT DETECTED Final   Streptococcus agalactiae NOT DETECTED NOT DETECTED Final   Streptococcus pneumoniae NOT DETECTED NOT DETECTED Final   Streptococcus pyogenes NOT DETECTED NOT DETECTED Final   Acinetobacter baumannii NOT DETECTED NOT DETECTED  Final   Enterobacteriaceae species NOT DETECTED NOT DETECTED Final   Enterobacter cloacae complex NOT DETECTED NOT DETECTED Final   Escherichia coli NOT DETECTED NOT DETECTED Final   Klebsiella oxytoca NOT DETECTED NOT DETECTED Final   Klebsiella pneumoniae NOT DETECTED NOT DETECTED Final   Proteus species NOT DETECTED NOT DETECTED Final   Serratia marcescens NOT DETECTED NOT DETECTED Final   Haemophilus influenzae NOT DETECTED NOT DETECTED Final   Neisseria meningitidis NOT DETECTED NOT DETECTED Final   Pseudomonas aeruginosa NOT DETECTED NOT DETECTED Final   Candida albicans NOT DETECTED NOT DETECTED Final   Candida glabrata NOT DETECTED NOT DETECTED Final   Candida krusei NOT DETECTED NOT DETECTED Final   Candida parapsilosis NOT DETECTED NOT DETECTED Final   Candida tropicalis NOT DETECTED NOT DETECTED Final    Comment: Performed at [facility], [address] [place], [place] [address]  SARS Coronavirus 2 by RT PCR (hospital order, performed in [facility] hospital lab) Nasopharyngeal Nasopharyngeal Swab     Status: None   Collection Time: 04/22/20 12:05 PM   Specimen: Nasopharyngeal Swab  Result Value Ref Range Status   SARS Coronavirus 2 NEGATIVE NEGATIVE Final    Comment: (NOTE) SARS-CoV-2 target nucleic acids are NOT DETECTED.  The SARS-CoV-2 RNA is generally detectable in upper and lower respiratory specimens during the acute phase of infection. The lowest concentration of SARS-CoV-2 viral copies this assay can detect is 250 copies / mL. A negative result does not preclude SARS-CoV-2 infection and should not be used as the sole basis for treatment or other patient management decisions.  A negative result may occur with improper specimen collection / handling, submission of specimen other than nasopharyngeal swab, presence of viral mutation(s) within the areas targeted by this assay, and inadequate number of viral copies (<250 copies / mL). A negative result must be  combined with clinical observations, patient history, and epidemiological information.  Fact Sheet for Patients:   StrictlyIdeas.no  Fact Sheet for Healthcare Providers: BankingDealers.co.za  This test is not yet approved or  cleared by the [place] FDA and has been authorized for detection and/or diagnosis of SARS-CoV-2 by FDA under an Emergency Use Authorization (EUA).  This EUA will remain in effect (meaning this test can be used) for the duration of the COVID-19 declaration under Section 564(b)(1) of the Act, 21 U.S.C. section 360bbb-3(b)(1), unless the authorization is terminated or revoked sooner.  Performed at [facility], [place] [address], [place], [place]  [address]          Radiology Studies: DG Chest Portable 1 View  Result Date: 04/22/2020 CLINICAL DATA:  Dyspnea EXAM: PORTABLE CHEST 1 VIEW COMPARISON:  April 13, 2020 FINDINGS: The heart size is mildly enlarged. There are aortic calcifications. There is a right perihilar and infrahilar airspace opacity. There is no pneumothorax. Mild vascular congestion is noted without evidence for large pleural effusion. There are old healed left-sided rib fractures. IMPRESSION: 1. Right perihilar and infrahilar airspace opacity concerning for pneumonia. Follow-up to radiologic resolution is recommended. 2. Mild vascular congestion without overt pulmonary edema. Electronically Signed   By: Constance Holster M.D.   On: 04/22/2020 01:51        Scheduled Meds: . aspirin  81 mg Oral Daily  . carvedilol  3.125 mg Oral BID WC  . [START ON 04/24/2020] enoxaparin (LOVENOX) injection  30 mg Subcutaneous Q24H  . isosorbide-hydrALAZINE  1 tablet Oral TID  . levETIRAcetam  500 mg Oral BID  . tamsulosin  0.4 mg Oral QPC supper   Continuous Infusions:   LOS: 1 day   Time spent= 35 mins    Dawnn Nam Arsenio Loader, MD [facility]  If 7PM-7AM, please contact  night-coverage  04/23/2020, 8:12 AM

## 2020-04-23 NOTE — Evaluation (Signed)
Occupational Therapy Evaluation Patient Details Name: Tim Bradley MRN: [id number] DOB: Apr 03, 1951 Today's Date: 04/23/2020    History of Present Illness Pt is a 69 y/o male admitted secondary to increased LE swelling and fall. Thought to be secondary to CHF. PMH includes HTN, CKD, seizures, CHF, DM, substance abuse, alcohol abuse and back surgery.    Clinical Impression   PTA, patient was living in a private residence with his girlfriend and was Mod I from wc level for BADLs. Patient reports progressive generalized weakness limiting mobility to squat-pivot transfers and lateral scoots from bed > wc > commode. Patient currently requiring Mod A for low squat-pivot transfer and LB bathing/dressing. Patient would benefit from continued acute OT services to maximize safety and independence with self-care tasks and decrease caregiver burden. Given patients CLOF, recommendation for short-term SNF rehab in prep for safe d/c to prior level of living.     Follow Up Recommendations  SNF;Supervision/Assistance - 24 hour    Equipment Recommendations  None recommended by OT    Recommendations for Other Services       Precautions / Restrictions Precautions Precautions: Fall Restrictions Weight Bearing Restrictions: No      Mobility Bed Mobility Overal bed mobility: Needs Assistance             General bed mobility comments: Patient seated in recliner upon entry.   Transfers Overall transfer level: Needs assistance Equipment used: None Transfers: Squat Pivot Transfers     Squat pivot transfers: Mod assist;From elevated surface    Lateral/Scoot Transfers: Mod assist General transfer comment: Mod A for low squat-pivot transfer from recliner <> EOB. Patient demonstrates good hand placement without cueing.     Balance Overall balance assessment: Needs assistance Sitting-balance support: Feet supported;No upper extremity supported Sitting balance-Leahy Scale: Fair                                      ADL either performed or assessed with clinical judgement   ADL Overall ADL's : Needs assistance/impaired Eating/Feeding: Set up   Grooming: Set up;Sitting   Upper Body Bathing: Minimal assistance;Sitting   Lower Body Bathing: Moderate assistance;Sitting/lateral leans   Upper Body Dressing : Minimal assistance;Sitting   Lower Body Dressing: Moderate assistance;Sitting/lateral leans   Toilet Transfer: Moderate assistance;Squat-pivot;BSC;Requires drop arm   Toileting- Clothing Manipulation and Hygiene: Moderate assistance;Sitting/lateral lean               Vision Baseline Vision/History: No visual deficits Patient Visual Report: No change from baseline Vision Assessment?: No apparent visual deficits     Perception     Praxis      Pertinent Vitals/Pain Pain Assessment: 0-10 Pain Score: 8  Pain Location: BLE  Pain Descriptors / Indicators: Aching;Grimacing;Guarding Pain Intervention(s): Limited activity within patient's tolerance;Monitored during session     Hand Dominance     Extremity/Trunk Assessment Upper Extremity Assessment Upper Extremity Assessment: Generalized weakness   Lower Extremity Assessment Lower Extremity Assessment: Defer to PT evaluation   Cervical / Trunk Assessment Cervical / Trunk Assessment: Normal   Communication Communication Communication: No difficulties   Cognition Arousal/Alertness: Awake/alert Behavior During Therapy: WFL for tasks assessed/performed Overall Cognitive Status: No family/caregiver present to determine baseline cognitive functioning  General Comments       Exercises     Shoulder Instructions      Home Living Family/patient expects to be discharged to:: Private residence Living Arrangements: Non-relatives/Friends (Girlfriend) Available Help at Discharge: Friend(s);Available 24 hours/day Type of Home: House Home Access:  Stairs to enter [employer] of Steps: 2 Entrance Stairs-Rails: None Home Layout: One level     Bathroom Shower/Tub: Teacher, early years/pre: Standard     Home Equipment: Environmental consultant - 2 wheels;Cane - single point;Wheelchair - manual          Prior Functioning/Environment Level of Independence: Independent with assistive device(s)        Comments: Reports he uses WC. Has been sliding into WC and has been unable to stand. Reports every time he stands he falls. Reports he has been using a cup to urinate in and has bowel movement on the floor or in his WC and then cleans himself up.         OT Problem List: Decreased strength;Decreased activity tolerance;Impaired balance (sitting and/or standing);Decreased coordination;Decreased knowledge of use of DME or AE;Pain      OT Treatment/Interventions: Self-care/ADL training;Therapeutic exercise;Energy conservation;DME and/or AE instruction;Therapeutic activities;Patient/family education;Balance training    OT Goals(Current goals can be found in the care plan section) Acute Rehab OT Goals Patient Stated Goal: To be more independent OT Goal Formulation: With patient Time For Goal Achievement: 05/07/20 Potential to Achieve Goals: Good ADL Goals Pt Will Perform Lower Body Bathing: with modified independence;sitting/lateral leans;with adaptive equipment Pt Will Perform Lower Body Dressing: with modified independence;sit to/from stand;with adaptive equipment Pt Will Transfer to Toilet: bedside commode;stand pivot transfer;with min guard assist Pt Will Perform Toileting - Clothing Manipulation and hygiene: sitting/lateral leans;with supervision  OT Frequency: Min 2X/week   Barriers to D/C: Decreased caregiver support          Co-evaluation              AM-PAC OT "6 Clicks" Daily Activity     Outcome Measure Help from another person eating meals?: A Little Help from another person taking care of personal  grooming?: A Little Help from another person toileting, which includes using toliet, bedpan, or urinal?: A Lot Help from another person bathing (including washing, rinsing, drying)?: A Lot Help from another person to put on and taking off regular upper body clothing?: A Little Help from another person to put on and taking off regular lower body clothing?: A Lot 6 Click Score: 15   End of Session Equipment Utilized During Treatment: Gait belt  Activity Tolerance: Patient tolerated treatment well Patient left: in chair;with call bell/phone within reach;with chair alarm set  OT Visit Diagnosis: Unsteadiness on feet (R26.81);Other abnormalities of gait and mobility (R26.89);Muscle weakness (generalized) (M62.81);Repeated falls (R29.6)                Time: 0086-7619 OT Time Calculation (min): 19 min Charges:  OT General Charges $OT Visit: 1 Visit OT Evaluation $OT Eval Moderate Complexity: 1 Mod  Joevanni Roddey H. OTR/L Supplemental OT, Department of rehab services [phone]  Rabon Scholle R H. 04/23/2020, 12:02 PM

## 2020-04-24 DIAGNOSIS — N400 Enlarged prostate without lower urinary tract symptoms: Secondary | ICD-10-CM | POA: Diagnosis not present

## 2020-04-24 DIAGNOSIS — I10 Essential (primary) hypertension: Secondary | ICD-10-CM | POA: Diagnosis not present

## 2020-04-24 DIAGNOSIS — I5043 Acute on chronic combined systolic (congestive) and diastolic (congestive) heart failure: Secondary | ICD-10-CM | POA: Diagnosis not present

## 2020-04-24 DIAGNOSIS — R531 Weakness: Secondary | ICD-10-CM | POA: Diagnosis not present

## 2020-04-24 LAB — BASIC METABOLIC PANEL
Anion gap: 8 (ref 5–15)
BUN: 52 mg/dL — ABNORMAL HIGH (ref 8–23)
CO2: 27 mmol/L (ref 22–32)
Calcium: 8.5 mg/dL — ABNORMAL LOW (ref 8.9–10.3)
Chloride: 104 mmol/L (ref 98–111)
Creatinine, Ser: 2.64 mg/dL — ABNORMAL HIGH (ref 0.61–1.24)
GFR calc Af Amer: 27 mL/min — ABNORMAL LOW (ref >60)
GFR calc non Af Amer: 23 mL/min — ABNORMAL LOW (ref >60)
Glucose, Bld: 103 mg/dL — ABNORMAL HIGH (ref 70–99)
Potassium: 4 mmol/L (ref 3.5–5.1)
Sodium: 139 mmol/L (ref 135–145)

## 2020-04-24 LAB — CBC
HCT: 36.3 % — ABNORMAL LOW (ref 39.0–52.0)
Hemoglobin: 11.7 g/dL — ABNORMAL LOW (ref 13.0–17.0)
MCH: 29.1 pg (ref 26.0–34.0)
MCHC: 32.2 g/dL (ref 30.0–36.0)
MCV: 90.3 fL (ref 80.0–100.0)
Platelets: 247 10*3/uL (ref 150–400)
RBC: 4.02 MIL/uL — ABNORMAL LOW (ref 4.22–5.81)
RDW: 13.2 % (ref 11.5–15.5)
WBC: 5.4 10*3/uL (ref 4.0–10.5)
nRBC: 0 % (ref 0.0–0.2)

## 2020-04-24 LAB — MAGNESIUM: Magnesium: 2.1 mg/dL (ref 1.7–2.4)

## 2020-04-24 NOTE — TOC Initial Note (Signed)
Transition of Care [facility]) - Initial/Assessment Note    Patient Details  Name: Tim Bradley MRN: [id number] Date of Birth: 1950/11/14  [facility] CM/SW Contact:    Jacquelynn Cree Phone Number: 04/24/2020, 10:13 AM  Clinical Narrative:                 CSW met with patient bedside to discuss PT recommendation for short-term SNF placement at discharge. Patient lives with his girlfriend in a single family residence. Patient states he has been using a wheel chair for assistance. Patient is in agreement with SNF placement at discharge and provided permission to be faxed out to [facility] area. Patient is not vaccinated.  Expected Discharge Plan: Skilled Nursing Facility Barriers to Discharge: Continued Medical Work up   Patient Goals and CMS Choice   CMS Medicare.gov Compare Post Acute Care list provided to:: Patient Choice offered to / list presented to : Patient  Expected Discharge Plan and Services Expected Discharge Plan: [place] In-house Referral: Clinical Social Work     Living arrangements for the past 2 months: [place]                                      Prior Living Arrangements/Services Living arrangements for the past 2 months: Single Family Home Lives with:: Significant Other Patient language and need for interpreter reviewed:: Yes        Need for Family Participation in Patient Care: No (Comment) Care giver support system in place?: Yes (comment)      Activities of Daily Living Home Assistive Devices/Equipment: Cane (specify quad or straight), Walker (specify type) ADL Screening (condition at time of admission) Patient's cognitive ability adequate to safely complete daily activities?: Yes Is the patient deaf or have difficulty hearing?: No Does the patient have difficulty seeing, even when wearing glasses/contacts?: No Does the patient have difficulty concentrating, remembering, or making  decisions?: No Patient able to express need for assistance with ADLs?: Yes Does the patient have difficulty dressing or bathing?: No Independently performs ADLs?: Yes (appropriate for developmental age) Does the patient have difficulty walking or climbing stairs?: Yes Weakness of Legs: Both Weakness of Arms/Hands: None  Permission Sought/Granted Permission sought to share information with : Facility Art therapist granted to share information with : Yes, Verbal Permission Granted  Share Information with NAME: Crystal  Permission granted to share info w AGENCY: SNFs  Permission granted to share info w Relationship: Girlfriend  Permission granted to share info w Contact Information: [phone]  Emotional Assessment Appearance:: Appears stated age Attitude/Demeanor/Rapport: Unable to Assess Affect (typically observed): Unable to Assess Orientation: : Oriented to Self, Oriented to Place, Oriented to  Time, Oriented to Situation Alcohol / Substance Use: Not Applicable Psych Involvement: No (comment)  Admission diagnosis:  Acute exacerbation of CHF (congestive heart failure) (HCC) [I50.9] Acute on chronic congestive heart failure, unspecified heart failure type [facility]) [I50.9] Patient Active Problem List   Diagnosis Date Noted  . Acute exacerbation of CHF (congestive heart failure) ([place]) 04/22/2020  . Essential hypertension 04/11/2020  . Leg edema 04/11/2020  . Dyspnea 04/11/2020  . Acute CHF (congestive heart failure) ([place]) 04/11/2020  . BPH (benign prostatic hyperplasia) 04/11/2020  . 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 Generalized weakness 04/11/2020  . Seizure ([place]) 07/21/2019  . Cocaine abuse ([place])   .  Constipation 08/22/2018  . Gastritis 08/20/2018  . Heme positive stool   . Acute blood loss anemia secondary to massive gastric ulcer   . Hyponatremia   . Abscess, gluteal, left 03/26/2017  . Type 2 diabetes mellitus  ([place]) 03/26/2017  . Adrenal nodule ([place]) 03/26/2017  . Closed fracture of body of thoracic vertebra ([place]) 03/26/2017  . Spinal stenosis of lumbar region 03/26/2017   PCP:  [place]:   [id number] [address] [place]Phone: [phone]: [phone] [place] [address] [address] [address] [address]Phone: [phone]: [address] [address]Phone: [phone]: [phone]    Social Determinants of Health (SDOH) Interventions    Readmission Risk Interventions Readmission Risk Prevention Plan 07/24/2019  Transportation Screening Complete  PCP or Specialist Appt within 3-5 Days Complete  [place]RI or [place]  Social Work Consult for [place]anning/Counseling Complete  Palliative Care Screening Not Applicable  Medication Review (Press photographer Complete  Some recent data might be hidden

## 2020-04-24 NOTE — TOC Progression Note (Signed)
Transition of Care [facility]) - Progression Note    Patient Details  Name: Tim Bradley MRN: [id number] Date of Birth: 10-03-1950  Transition of Care [facility]) CM/SW [place], [place] Phone Number: 04/24/2020, 4:49 PM  Clinical Narrative:    CSW contacted by RN and informed patient's sister Tim Bradley [phone] called about patient being transferred to the [facility] hospital and wanted to provide some information. CSW contacted Tim Bradley who stated patient's [facility] SW is Tim Bradley [phone] ext [phone]. Tim Bradley stated Tim Bradley [phone] ext [phone].   CSW spoke with patient who expressed he did not request to be transferred to the [place]. Patient stated his sister wants him to transfer "trying to run things." Patient expressed he is fine with his current placement.  Expected Discharge Plan: [place] Barriers to Discharge: Continued Medical Work up  Expected Discharge Plan and Services Expected Discharge Plan: [place] In-house Referral: Clinical Social Work     Living arrangements for the past 2 months: Single Family Home                                       Social Determinants of Health (SDOH) Interventions    Readmission Risk Interventions Readmission Risk Prevention Plan 07/24/2019  Transportation Screening Complete  PCP or Specialist Appt within 3-5 Days Complete  HRI or [place] Complete  Social Work Consult for [place] Planning/Counseling Complete  Palliative Care Screening Not Applicable  Medication Review Press photographer) Complete  Some recent data might be hidden

## 2020-04-24 NOTE — Progress Notes (Addendum)
PROGRESS NOTE    PARV MANTHEY  ZOX:[id number] DOB: [birth year] DOA: 04/21/2020 [facility]   Brief Narrative:  69 y.o.malewith PMH ofHTN, CKD, BPH, seizure disorder and CHF who presented to ED with weakness and leg swelling and admitted for CHF exacerbation and placement (left AMA 7/16).  He did not pick up his meds after leaving AMA last time due to financial reasons, continues to smoke 1 pack of cigarettes weekly.  During the hospitalization he was diuresed and is symptomatically started feeling much better.  PT recommended SNF, TOC team consulted.   Assessment & Plan:   Principal Problem:   Acute exacerbation of CHF (congestive heart failure) (HCC) Active Problems:   Seizure (HCC)   Essential hypertension   BPH (benign prostatic hyperplasia)   CKD (chronic kidney disease) stage 4, GFR 15-29 ml/min (HCC)   Generalized weakness  Acute respiratory distress, progressive with hypoxia, 4 L nasal cannula, improved Acute congestive heart failure with reduced ejection fraction, EF 20%.  Class III, improving Medication noncompliance -Diuretics on hold due to rising creatinine, today appears to be improving but does not significantly appear to be volume overloaded or short of breath therefore hold off on diuretics for another day at least. -Strict input and output -Supportive care, wean off oxygen -Procalcitonin negative, no need for antibiotics -TOC consulted to help with medications -Chest x-ray shows pulmonary vascular congestion. -Echo 7/14-EF less than 20%, grade 1 DD -Coreg, aspirin, BiDil  Addendum 220pm: Bcx positive for Stap Aureus in one set, likely a contaminate. No evidence of infection.  Generalized weakness -PT/OT recommending SNF, TOC team is working on this  Essential hypertension -Supposed to be on BiDil and Coreg  Acute kidney injury on CKD stage IV -Baseline creatinine around 2.5.  Creatinine today 2.64  Diabetes mellitus type 2, blood glucose in  acceptable range -Insulin sliding scale and Accu-Chek. -Hemoglobin A1c 5.9  Tobacco use/polysubstance abuse -Advised to quit smoking cigarettes and advised to quit drinking alcohol. -Monitor for any withdrawal symptoms  History of seizures -Keppra 500 mg twice daily  BPH -Flomax   DVT prophylaxis: Lovenox Code Status: Full code Family Communication: None at bedside  Status is: Inpatient  Remains inpatient appropriate because:Unsafe d/c plan   Dispo: The patient is from: Home              Anticipated d/c is to: SNF              Anticipated d/c date is: 1 day              Patient currently is medically stable to d/c.  Symptomatically patient is feeling better, TOC team working on SNF placement       Body mass index is 26.37 kg/m.     Subjective: Patient sitting on the chair feels better denies any complaints this morning.  Review of Systems Otherwise negative except as per HPI, including: General: Denies fever, chills, night sweats or unintended weight loss. Resp: Denies cough, wheezing, shortness of breath. Cardiac: Denies chest pain, palpitations, orthopnea, paroxysmal nocturnal dyspnea. GI: Denies abdominal pain, nausea, vomiting, diarrhea or constipation GU: Denies dysuria, frequency, hesitancy or incontinence MS: Denies muscle aches, joint pain or swelling Neuro: Denies headache, neurologic deficits (focal weakness, numbness, tingling), abnormal gait Psych: Denies anxiety, depression, SI/HI/AVH Skin: Denies new rashes or lesions ID: Denies sick contacts, exotic exposures, travel  Examination:  Constitutional: Not in acute distress Respiratory: Bibasilar crackles Cardiovascular: Normal sinus rhythm, no rubs Abdomen: Nontender nondistended good bowel  sounds Musculoskeletal: 1+ bilateral lower extremity pitting edema Skin: No rashes seen Neurologic: CN 2-12 grossly intact.  And nonfocal Psychiatric: Alert awake oriented X3, poor judgment and  insight   Objective: Vitals:   04/23/20 2303 04/24/20 0040 04/24/20 0455 04/24/20 0802  BP: (!) 117/61 112/70 116/66   Pulse:  79 65 82  Resp:  16 16   Temp:  98.3 F (36.8 C) 97.7 F (36.5 C)   TempSrc:  Oral Oral   SpO2:  95% 98%   Weight:   (!) 93.2 kg   Height:        Intake/Output Summary (Last 24 hours) at 04/24/2020 1136 Last data filed at 04/24/2020 0700 Gross per 24 hour  Intake 1060 ml  Output 1725 ml  Net -665 ml   Filed Weights   04/21/20 2113 04/23/20 0439 04/24/20 0455  Weight: 90.7 kg (!) 92.1 kg (!) 93.2 kg     Data Reviewed:   CBC: Recent Labs  Lab 04/21/20 2328 04/22/20 0323 04/23/20 0513 04/24/20 0414  WBC 5.7 6.2 6.5 5.4  NEUTROABS 3.3  --   --   --   HGB 12.1* 12.1* 12.4* 11.7*  HCT 39.7 39.9 39.4 36.3*  MCV 91.3 92.8 90.2 90.3  PLT 223 239 242 702   Basic Metabolic Panel: Recent Labs  Lab 04/21/20 2328 04/22/20 0323 04/23/20 0513 04/24/20 0414  NA 140 142 139 139  K 4.3 4.2 4.1 4.0  CL 107 108 102 104  CO2 25 26 29 27   GLUCOSE 97 117* 127* 103*  BUN 38* 37* 50* 52*  CREATININE 2.11* 2.24* 2.97* 2.64*  CALCIUM 8.9 9.0 8.8* 8.5*  MG  --  1.9 2.0 2.1   GFR: Estimated Creatinine Clearance: 29.8 mL/min (A) (by C-G formula based on SCr of 2.64 mg/dL (H)). Liver Function Tests: Recent Labs  Lab 04/22/20 0323  AST 18  ALT 18  ALKPHOS 53  BILITOT 0.6  PROT 6.0*  ALBUMIN 2.9*   No results for input(s): LIPASE, AMYLASE in the last 168 hours. No results for input(s): AMMONIA in the last 168 hours. Coagulation Profile: No results for input(s): INR, PROTIME in the last 168 hours. Cardiac Enzymes: No results for input(s): CKTOTAL, CKMB, CKMBINDEX, TROPONINI in the last 168 hours. BNP (last 3 results) No results for input(s): PROBNP in the last 8760 hours. HbA1C: Recent Labs    04/22/20 0323  HGBA1C 5.9*   CBG: No results for input(s): GLUCAP in the last 168 hours. Lipid Profile: No results for input(s): CHOL, HDL,  LDLCALC, TRIG, CHOLHDL, LDLDIRECT in the last 72 hours. Thyroid Function Tests: No results for input(s): TSH, T4TOTAL, FREET4, T3FREE, THYROIDAB in the last 72 hours. Anemia Panel: No results for input(s): VITAMINB12, FOLATE, FERRITIN, TIBC, IRON, RETICCTPCT in the last 72 hours. Sepsis Labs: Recent Labs  Lab 04/22/20 0323  PROCALCITON <0.10    Recent Results (from the past 240 hour(s))  Urine Culture     Status: Abnormal   Collection Time: 04/22/20  2:16 AM   Specimen: Urine, Random  Result Value Ref Range Status   Specimen Description URINE, RANDOM  Final   Special Requests   Final    NONE Performed at [facility], [address], [place], [place] [address]    Culture MULTIPLE SPECIES PRESENT, SUGGEST RECOLLECTION (A)  Final   Report Status 04/23/2020 FINAL  Final  Culture, blood (Routine X 2) w Reflex to ID Panel     Status: None (Preliminary result)  Collection Time: 04/22/20  3:23 AM   Specimen: BLOOD LEFT FOREARM  Result Value Ref Range Status   Specimen Description BLOOD LEFT FOREARM  Final   Special Requests   Final    BOTTLES DRAWN AEROBIC ONLY Blood Culture results may not be optimal due to an inadequate volume of blood received in culture bottles   Culture   Final    NO GROWTH 1 DAY Performed at [facility], [address] [place], [place] [address]    Report Status PENDING  Incomplete  Culture, blood (Routine X 2) w Reflex to ID Panel     Status: Abnormal (Preliminary result)   Collection Time: 04/22/20  3:33 AM   Specimen: BLOOD  Result Value Ref Range Status   Specimen Description BLOOD LEFT ANTECUBITAL  Final   Special Requests   Final    BOTTLES DRAWN AEROBIC AND ANAEROBIC Blood Culture results may not be optimal due to an inadequate volume of blood received in culture bottles   Culture  Setup Time   Final    GRAM POSITIVE COCCI IN BOTH AEROBIC AND ANAEROBIC BOTTLES Organism ID to follow CRITICAL RESULT CALLED TO, READ BACK BY AND  VERIFIED WITH: PHRMD V BRYK @0237  04/23/20 BY S GEZAHEGN    Culture (A)  Final    STAPHYLOCOCCUS SPECIES (COAGULASE NEGATIVE) CULTURE REINCUBATED FOR BETTER GROWTH Performed at [facility], [place] [address] [place], [place] [address]    Report Status PENDING  Incomplete  Blood Culture ID Panel (Reflexed)     Status: Abnormal   Collection Time: 04/22/20  3:33 AM  Result Value Ref Range Status   Enterococcus species NOT DETECTED NOT DETECTED Final   Listeria monocytogenes NOT DETECTED NOT DETECTED Final   Staphylococcus species DETECTED (A) NOT DETECTED Final    Comment: Methicillin (oxacillin) susceptible coagulase negative staphylococcus. Possible blood culture contaminant (unless isolated from more than one blood culture draw or clinical case suggests pathogenicity). No antibiotic treatment is indicated for blood  culture contaminants. CRITICAL RESULT CALLED TO, READ BACK BY AND VERIFIED WITH: PHARMD V BRYK [id number]    Staphylococcus aureus (BCID) NOT DETECTED NOT DETECTED Final   Methicillin resistance NOT DETECTED NOT DETECTED Final   Streptococcus species NOT DETECTED NOT DETECTED Final   Streptococcus agalactiae NOT DETECTED NOT DETECTED Final   Streptococcus pneumoniae NOT DETECTED NOT DETECTED Final   Streptococcus pyogenes NOT DETECTED NOT DETECTED Final   Acinetobacter baumannii NOT DETECTED NOT DETECTED Final   Enterobacteriaceae species NOT DETECTED NOT DETECTED Final   Enterobacter cloacae complex NOT DETECTED NOT DETECTED Final   Escherichia coli NOT DETECTED NOT DETECTED Final   Klebsiella oxytoca NOT DETECTED NOT DETECTED Final   Klebsiella pneumoniae NOT DETECTED NOT DETECTED Final   Proteus species NOT DETECTED NOT DETECTED Final   Serratia marcescens NOT DETECTED NOT DETECTED Final   Haemophilus influenzae NOT DETECTED NOT DETECTED Final   Neisseria meningitidis NOT DETECTED NOT DETECTED Final   Pseudomonas aeruginosa NOT DETECTED NOT DETECTED Final    Candida albicans NOT DETECTED NOT DETECTED Final   Candida glabrata NOT DETECTED NOT DETECTED Final   Candida krusei NOT DETECTED NOT DETECTED Final   Candida parapsilosis NOT DETECTED NOT DETECTED Final   Candida tropicalis NOT DETECTED NOT DETECTED Final    Comment: Performed at [facility], [address] [place], [place] [address]  SARS Coronavirus 2 by RT PCR (hospital order, performed in [facility] hospital lab) Nasopharyngeal Nasopharyngeal Swab  Status: None   Collection Time: 04/22/20 12:05 PM   Specimen: Nasopharyngeal Swab  Result Value Ref Range Status   SARS Coronavirus 2 NEGATIVE NEGATIVE Final    Comment: (NOTE) SARS-CoV-2 target nucleic acids are NOT DETECTED.  The SARS-CoV-2 RNA is generally detectable in upper and lower respiratory specimens during the acute phase of infection. The lowest concentration of SARS-CoV-2 viral copies this assay can detect is 250 copies / mL. A negative result does not preclude SARS-CoV-2 infection and should not be used as the sole basis for treatment or other patient management decisions.  A negative result may occur with improper specimen collection / handling, submission of specimen other than nasopharyngeal swab, presence of viral mutation(s) within the areas targeted by this assay, and inadequate number of viral copies (<250 copies / mL). A negative result must be combined with clinical observations, patient history, and epidemiological information.  Fact Sheet for Patients:   StrictlyIdeas.no  Fact Sheet for Healthcare Providers: BankingDealers.co.za  This test is not yet approved or  cleared by the [place] FDA and has been authorized for detection and/or diagnosis of SARS-CoV-2 by FDA under an Emergency Use Authorization (EUA).  This EUA will remain in effect (meaning this test can be used) for the duration of the COVID-19 declaration under Section 564(b)(1) of the  Act, 21 U.S.C. section 360bbb-3(b)(1), unless the authorization is terminated or revoked sooner.  Performed at [facility], [place] [address] [place], [place] [address]          Radiology Studies: No results found.      Scheduled Meds: . aspirin  81 mg Oral Daily  . carvedilol  3.125 mg Oral BID WC  . enoxaparin (LOVENOX) injection  30 mg Subcutaneous Q24H  . isosorbide-hydrALAZINE  1 tablet Oral TID  . levETIRAcetam  500 mg Oral BID  . tamsulosin  0.4 mg Oral QPC supper   Continuous Infusions:   LOS: 2 days   Time spent= 35 mins    Tyrin Herbers Arsenio Loader, MD [facility]  If 7PM-7AM, please contact night-coverage  04/24/2020, 11:36 AM

## 2020-04-24 NOTE — NC FL2 (Signed)
[place] LEVEL OF CARE SCREENING TOOL     IDENTIFICATION  Patient Name: Tim Bradley Birthdate: Feb 25, 1951 Sex: male Admission Date (Current Location): 04/21/2020  [facility] and [place] Number:  Herbalist and Address:  [facility] [place], [place] [address], [place], [place] [address]      Provider Number: [id number]  Attending Physician Name and Address:  Damita Lack, MD  Relative Name and Phone Number:       Current Level of Care: Hospital Recommended Level of Care: [place] Prior Approval Number:    Date Approved/Denied:   PASRR Number: [id number]  Discharge Plan: SNF    Current Diagnoses: Patient Active Problem List   Diagnosis Date Noted  . Acute exacerbation of CHF (congestive heart failure) ([place]) 04/22/2020  . Essential hypertension 04/11/2020  . Leg edema 04/11/2020  . Dyspnea 04/11/2020  . Acute CHF (congestive heart failure) ([place]) 04/11/2020  . BPH (benign prostatic hyperplasia) 04/11/2020  . 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 Generalized weakness 04/11/2020  . Seizure ([place]) 07/21/2019  . Cocaine abuse ([place])   . Constipation 08/22/2018  . Gastritis 08/20/2018  . Heme positive stool   . Acute blood loss anemia secondary to massive gastric ulcer   . Hyponatremia   . Abscess, gluteal, left 03/26/2017  . Type 2 diabetes mellitus ([place]) 03/26/2017  . Adrenal nodule ([place]) 03/26/2017  . Closed fracture of body of thoracic vertebra ([place]) 03/26/2017  . Spinal stenosis of lumbar region 03/26/2017    Orientation RESPIRATION BLADDER Height & Weight     Self, Time, Situation, Place  Normal (See discharge summary) Incontinent, External catheter Weight: (!) 205 lb 6.4 oz (93.2 kg) Height:  6\' 2"  (188 cm)  BEHAVIORAL SYMPTOMS/MOOD NEUROLOGICAL BOWEL NUTRITION STATUS      Continent Diet (See discharge summary)  AMBULATORY STATUS COMMUNICATION OF NEEDS Skin    Extensive Assist Verbally Normal                       Personal Care Assistance Level of Assistance  Feeding, Dressing, Bathing Bathing Assistance: Limited assistance Feeding assistance: Independent Dressing Assistance: Limited assistance     Functional Limitations Info  Sight, Speech, Hearing Sight Info: Adequate Hearing Info: Adequate Speech Info: Adequate    SPECIAL CARE FACTORS FREQUENCY  PT (By licensed PT), OT (By licensed OT)     PT Frequency: 5x a week OT Frequency: 5x a week            Contractures Contractures Info: Not present    Additional Factors Info  Code Status, Allergies Code Status Info: FULL Allergies Info: NKA           Current Medications (04/24/2020):  This is the current hospital active medication list Current Facility-Administered Medications  Medication Dose Route Frequency Provider Last Rate Last Admin  . acetaminophen (TYLENOL) tablet 650 mg  650 mg Oral Q6H PRN Chauncey Mann, MD   650 mg at 04/22/20 1557  . aspirin chewable tablet 81 mg  81 mg Oral Daily Chauncey Mann, MD   81 mg at 04/24/20 0944  . carvedilol (COREG) tablet 3.125 mg  3.125 mg Oral BID WC Fair, Marin Shutter, MD   3.125 mg at 04/24/20 0802  . enoxaparin (LOVENOX) injection 30 mg  30 mg Subcutaneous Q24H Amin, Ankit Chirag, MD   30 mg at 04/24/20 0540  . isosorbide-hydrALAZINE (BIDIL) 20-37.5 MG per  tablet 1 tablet  1 tablet Oral TID Chauncey Mann, MD   1 tablet at 04/24/20 0944  . levETIRAcetam (KEPPRA) tablet 500 mg  500 mg Oral BID Chauncey Mann, MD   500 mg at 04/24/20 0944  . tamsulosin (FLOMAX) capsule 0.4 mg  0.4 mg Oral QPC supper Chauncey Mann, MD   0.4 mg at 04/23/20 1638     Discharge Medications: Please see discharge summary for a list of discharge medications.  Relevant Imaging Results:  Relevant Lab Results:   Additional Information SSN [id number]  Neysa Hotter [place], [place]

## 2020-04-25 DIAGNOSIS — I509 Heart failure, unspecified: Secondary | ICD-10-CM | POA: Diagnosis not present

## 2020-04-25 DIAGNOSIS — N400 Enlarged prostate without lower urinary tract symptoms: Secondary | ICD-10-CM | POA: Diagnosis not present

## 2020-04-25 DIAGNOSIS — I5043 Acute on chronic combined systolic (congestive) and diastolic (congestive) heart failure: Secondary | ICD-10-CM | POA: Diagnosis not present

## 2020-04-25 DIAGNOSIS — N184 Chronic kidney disease, stage 4 (severe): Secondary | ICD-10-CM | POA: Diagnosis not present

## 2020-04-25 LAB — BASIC METABOLIC PANEL
Anion gap: 7 (ref 5–15)
BUN: 47 mg/dL — ABNORMAL HIGH (ref 8–23)
CO2: 28 mmol/L (ref 22–32)
Calcium: 9.1 mg/dL (ref 8.9–10.3)
Chloride: 104 mmol/L (ref 98–111)
Creatinine, Ser: 2.26 mg/dL — ABNORMAL HIGH (ref 0.61–1.24)
GFR calc Af Amer: 33 mL/min — ABNORMAL LOW (ref >60)
GFR calc non Af Amer: 28 mL/min — ABNORMAL LOW (ref >60)
Glucose, Bld: 118 mg/dL — ABNORMAL HIGH (ref 70–99)
Potassium: 4.4 mmol/L (ref 3.5–5.1)
Sodium: 139 mmol/L (ref 135–145)

## 2020-04-25 LAB — CBC
HCT: 38.4 % — ABNORMAL LOW (ref 39.0–52.0)
Hemoglobin: 12 g/dL — ABNORMAL LOW (ref 13.0–17.0)
MCH: 27.9 pg (ref 26.0–34.0)
MCHC: 31.3 g/dL (ref 30.0–36.0)
MCV: 89.3 fL (ref 80.0–100.0)
Platelets: 249 10*3/uL (ref 150–400)
RBC: 4.3 MIL/uL (ref 4.22–5.81)
RDW: 13.2 % (ref 11.5–15.5)
WBC: 5.1 10*3/uL (ref 4.0–10.5)
nRBC: 0 % (ref 0.0–0.2)

## 2020-04-25 LAB — SARS CORONAVIRUS 2 BY RT PCR (DIASORIN): SARS Coronavirus 2: NEGATIVE

## 2020-04-25 LAB — MAGNESIUM: Magnesium: 2.3 mg/dL (ref 1.7–2.4)

## 2020-04-25 MED ORDER — DICLOFENAC SODIUM 1 % EX GEL
2.0000 g | Freq: Four times a day (QID) | CUTANEOUS | Status: DC | PRN
  Administered 2020-04-25 – 2020-04-26 (×2): 2 g via TOPICAL
  Filled 2020-04-25 (×2): qty 100

## 2020-04-25 MED ORDER — FUROSEMIDE 10 MG/ML IJ SOLN
40.0000 mg | Freq: Once | INTRAMUSCULAR | Status: AC
  Administered 2020-04-25: 40 mg via INTRAVENOUS
  Filled 2020-04-25: qty 4

## 2020-04-25 MED ORDER — FUROSEMIDE 40 MG PO TABS
40.0000 mg | ORAL_TABLET | Freq: Every day | ORAL | Status: DC
  Administered 2020-04-25 – 2020-04-26 (×2): 40 mg via ORAL
  Filled 2020-04-25 (×2): qty 1

## 2020-04-25 NOTE — TOC Progression Note (Signed)
Transition of Care [facility]) - Progression Note    Patient Details  Name: JHASE CREPPEL MRN: [id number] Date of Birth: 09/06/1951  Transition of Care [facility]) CM/SW [place], [place] Phone Number: 04/25/2020, 5:55 PM  Clinical Narrative:     CSW went to see patient at bedside to give SNF offer. Patient sister was on the phone and wanted to speak with CSW. Patients sister requested for patient to use his VA benefits and to contact April from the [facility] for SNF placement. CSW let patients sister know she will try to call April with [facility] first thing in the morning. CSW will follow up with patient and patients sister with progress with SNF placement for patient.  CSW will continue to follow.   Expected Discharge Plan: [place] Barriers to Discharge: Continued Medical Work up  Expected Discharge Plan and Services Expected Discharge Plan: [place] In-house Referral: Clinical Social Work     Living arrangements for the past 2 months: Single Family Home                                       Social Determinants of Health (SDOH) Interventions    Readmission Risk Interventions Readmission Risk Prevention Plan 07/24/2019  Transportation Screening Complete  PCP or Specialist Appt within 3-5 Days Complete  HRI or [place] Complete  Social Work Consult for [place] Planning/Counseling Complete  Palliative Care Screening Not Applicable  Medication Review Press photographer) Complete  Some recent data might be hidden

## 2020-04-25 NOTE — Progress Notes (Signed)
Physical Therapy Treatment Patient Details Name: Tim Bradley MRN: [id number] DOB: May 18, 1951 Today's Date: 04/25/2020    History of Present Illness Pt is a 69 y/o male admitted secondary to increased LE swelling and fall. Thought to be secondary to CHF. PMH includes HTN, CKD, seizures, CHF, DM, substance abuse, alcohol abuse and back surgery.     PT Comments    Pt admitted with above diagnosis. Pt was able to ambulate a few steps but needed mod assist and chair follow as he has ataxic gait and is very unsteady even with external assist and RW.   Pt currently with functional limitations due to balance and endurance deficits. Pt will benefit from skilled PT to increase their independence and safety with mobility to allow discharge to the venue listed below.     Follow Up Recommendations  SNF;Supervision/Assistance - 24 hour     Equipment Recommendations  None recommended by PT    Recommendations for Other Services OT consult     Precautions / Restrictions Precautions Precautions: Fall Precaution Comments: self reports 3 falls in last month Restrictions Weight Bearing Restrictions: No    Mobility  Bed Mobility Overal bed mobility: Needs Assistance             General bed mobility comments: Patient seated in recliner upon entry.   Transfers Overall transfer level: Needs assistance Equipment used: None Transfers: Sit to/from Stand           General transfer comment: mod assist to stand to RW taking incr time for pt to power up and reach for walker and then incr time for pt to straighten his trunk once he was up.    Ambulation/Gait Ambulation/Gait assistance: Mod assist;+2 safety/equipment Gait Distance (Feet): 4 Feet Assistive device: Rolling walker (2 wheeled) Gait Pattern/deviations: Decreased stride length;Decreased step length - right;Decreased step length - left;Step-to pattern;Shuffle;Ataxic;Drifts right/left;Staggering left;Staggering right;Narrow base of  support   Gait velocity interpretation: <1.31 ft/sec, indicative of household ambulator General Gait Details: Pt with ataxic gait with difficulty with coordination of feet.  Pt appeared to not have control over foot placement. Pt definitely needed the support of the RW as well as external support from therapist. Had to bring chair to follow pt as he fatigued after only 4 feet.     Stairs             Wheelchair Mobility    Modified Rankin (Stroke Patients Only)       Balance Overall balance assessment: Needs assistance Sitting-balance support: Feet supported;No upper extremity supported Sitting balance-Leahy Scale: Fair     Standing balance support: Bilateral upper extremity supported;During functional activity Standing balance-Leahy Scale: Poor Standing balance comment: relies on UE and external support                            Cognition Arousal/Alertness: Awake/alert Behavior During Therapy: WFL for tasks assessed/performed Overall Cognitive Status: No family/caregiver present to determine baseline cognitive functioning Area of Impairment: Problem solving;Safety/judgement;Awareness;Following commands                       Following Commands: Follows multi-step commands inconsistently;Follows one step commands consistently Safety/Judgement: Decreased awareness of safety Awareness: Emergent Problem Solving: Difficulty sequencing;Requires verbal cues;Requires tactile cues;Slow processing        Exercises      General Comments General comments (skin integrity, edema, etc.): VSS on RA      Pertinent Vitals/Pain Pain  Assessment: 0-10 Pain Score: 6  Pain Location: BLE  Pain Descriptors / Indicators: Aching;Grimacing;Guarding Pain Intervention(s): Limited activity within patient's tolerance;Monitored during session;Repositioned    Home Living                      Prior Function            PT Goals (current goals can now be  found in the care plan section) Acute Rehab PT Goals Patient Stated Goal: To be more independent Progress towards PT goals: Progressing toward goals    Frequency    Min 2X/week      PT Plan Current plan remains appropriate    Co-evaluation              AM-PAC PT "6 Clicks" Mobility   Outcome Measure  Help needed turning from your back to your side while in a flat bed without using bedrails?: A Little Help needed moving from lying on your back to sitting on the side of a flat bed without using bedrails?: A Little Help needed moving to and from a bed to a chair (including a wheelchair)?: A Lot Help needed standing up from a chair using your arms (e.g., wheelchair or bedside chair)?: A Lot Help needed to walk in hospital room?: A Lot Help needed climbing 3-5 steps with a railing? : Total 6 Click Score: 13    End of Session Equipment Utilized During Treatment: Gait belt Activity Tolerance: Patient tolerated treatment well Patient left: with call bell/phone within reach;in chair;with chair alarm set (on stretcher) Nurse Communication: Mobility status PT Visit Diagnosis: Unsteadiness on feet (R26.81);Muscle weakness (generalized) (M62.81);History of falling (Z91.81);Repeated falls (R29.6)     Time: 7209-4709 PT Time Calculation (min) (ACUTE ONLY): 13 min  Charges:  $Gait Training: 8-22 mins                     Mackensi Mahadeo W,PT [place] Pager:  [phone]  Office:  [place] 04/25/2020, 1:54 PM

## 2020-04-25 NOTE — Progress Notes (Addendum)
[facility] PROGRESS NOTE    Progress Note  Tim Bradley  UJW:[id number] DOB: 13-Apr-1951 DOA: 04/21/2020 PCP: [facility]     Brief Narrative:   Tim Bradley is an 69 y.o. 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  He did not pick up his med after leaving AMA due to financial reasons came back into the hospital symptomatically in heart failure.  Assessment/Plan:   Acute respiratory failure with hypoxia due to acute exacerbation of CHF (congestive heart failure) ([place]) He was started on IV Lasix and has diuresed nicely it of about 4 L. His diuresis was held due to rising creatinine. Continue to wean off oxygen. Infectious work-up was completely negative. TOC to help with medications. To new aspirin, Coreg and BiDil. 1 out of 2 blood cultures was positive for staph aureus likely a contaminant he has remained afebrile.  Generalized weakness: Physical therapy recommended skilled nursing facility, [facility] team working on placement.  Essential hypertension: Continue Coreg and BiDil.  Acute on chronic kidney disease stage IV: With a baseline creatinine around 2.5, after diuresis he had a mild rise now his creatinine has returned to baseline we will resume home dose Lasix.  History of seizures: Continue Keppra twice a day.  Tobacco abuse/polysubstance abuse: Counseling.  BPH: Continue Flomax.    DVT prophylaxis: lovenox Family Communication:none Status is: Inpatient  Remains inpatient appropriate because:Hemodynamically unstable   Dispo: The patient is from: Home              Anticipated d/c is to: SNF              Anticipated d/c date is: 3 days              Patient currently is not medically stable to  d/c.awaitin for insurance approval        Code Status:     Code Status Orders  (From admission, onward)         Start     Ordered   04/22/20 0302  Full code  Continuous        04/22/20 0301        Code Status History    Date Active Date Inactive Code Status Order ID Comments User Context   04/11/2020 0620 04/13/2020 1535 Full Code [id number]  Chotiner, Yevonne Aline, MD ED   07/21/2019 1943 07/24/2019 1736 Full Code [id number]  Ileene Musa T, DO ED   08/20/2018 1245 08/22/2018 2038 Full Code [id number]  Valinda Party, DO ED   03/26/2017 1340 03/28/2017 2221 Full Code [id number]  Ivor Reining Inpatient   03/26/2017 0746 03/26/2017 1340 Full Code [id number]  Rivet, Sindy Guadeloupe, MD ED   Advance Care Planning Activity        IV Access:    Peripheral IV   Procedures and diagnostic studies:   No results found.   Medical Consultants:    None.  Anti-Infectives:   none  Subjective:    Tim Bradley no new complaints.  Objective:    Vitals:   04/24/20 1212 04/24/20 1933 04/25/20 0347 04/25/20 0541  BP: 115/76 118/69  (!) 130/88  Pulse: 77 80  67  Resp: 16 16  19   Temp: (!) 97.4 F (36.3 C) 98.2 F (36.8 C)  98.1 F (  36.7 C)  TempSrc: Oral Oral  Oral  SpO2: 100% 97%  95%  Weight:   (!) 93.5 kg   Height:       SpO2: 95 %   Intake/Output Summary (Last 24 hours) at 04/25/2020 0928 Last data filed at 04/25/2020 9357 Gross per 24 hour  Intake 1076 ml  Output 701 ml  Net 375 ml   Filed Weights   04/23/20 0439 04/24/20 0455 04/25/20 0347  Weight: (!) 92.1 kg (!) 93.2 kg (!) 93.5 kg    Exam: General exam: In no acute distress. Respiratory system: Good air movement and clear to auscultation. Cardiovascular system: S1 & S2 heard, RRR. +JVD Gastrointestinal system: Abdomen is nondistended, soft and nontender.  Extremities: Trace edema.. Skin: No rashes, lesions or ulcers Psychiatry: Judgement and insight appear normal. Mood & affect  appropriate.    Data Reviewed:    Labs: Basic Metabolic Panel: Recent Labs  Lab 04/21/20 2328 04/21/20 2328 04/22/20 0177 04/22/20 0323 04/23/20 0513 04/23/20 0513 04/24/20 0414 04/25/20 0621  NA 140  --  142  --  139  --  139 139  K 4.3   < > 4.2   < > 4.1   < > 4.0 4.4  CL 107  --  108  --  102  --  104 104  CO2 25  --  26  --  29  --  27 28  GLUCOSE 97  --  117*  --  127*  --  103* 118*  BUN 38*  --  37*  --  50*  --  52* 47*  CREATININE 2.11*  --  2.24*  --  2.97*  --  2.64* 2.26*  CALCIUM 8.9  --  9.0  --  8.8*  --  8.5* 9.1  MG  --   --  1.9  --  2.0  --  2.1 2.3   < > = values in this interval not displayed.   GFR Estimated Creatinine Clearance: 34.9 mL/min (A) (by C-G formula based on SCr of 2.26 mg/dL (H)). Liver Function Tests: Recent Labs  Lab 04/22/20 0323  AST 18  ALT 18  ALKPHOS 53  BILITOT 0.6  PROT 6.0*  ALBUMIN 2.9*   No results for input(s): LIPASE, AMYLASE in the last 168 hours. No results for input(s): AMMONIA in the last 168 hours. Coagulation profile No results for input(s): INR, PROTIME in the last 168 hours. COVID-19 Labs  No results for input(s): DDIMER, FERRITIN, LDH, CRP in the last 72 hours.  Lab Results  Component Value Date   SARSCOV2NAA NEGATIVE 04/22/2020   [place] NEGATIVE 04/11/2020   [place] NEGATIVE 07/21/2019    CBC: Recent Labs  Lab 04/21/20 2328 04/22/20 0323 04/23/20 0513 04/24/20 0414 04/25/20 0621  WBC 5.7 6.2 6.5 5.4 5.1  NEUTROABS 3.3  --   --   --   --   HGB 12.1* 12.1* 12.4* 11.7* 12.0*  HCT 39.7 39.9 39.4 36.3* 38.4*  MCV 91.3 92.8 90.2 90.3 89.3  PLT 223 239 242 247 249   Cardiac Enzymes: No results for input(s): CKTOTAL, CKMB, CKMBINDEX, TROPONINI in the last 168 hours. BNP (last 3 results) No results for input(s): PROBNP in the last 8760 hours. CBG: No results for input(s): GLUCAP in the last 168 hours. D-Dimer: No results for input(s): DDIMER in the last 72 hours. Hgb A1c: No  results for input(s): HGBA1C in the last 72 hours. Lipid Profile: No results for input(s): CHOL, HDL, LDLCALC, TRIG,  CHOLHDL, LDLDIRECT in the last 72 hours. Thyroid function studies: No results for input(s): TSH, T4TOTAL, T3FREE, THYROIDAB in the last 72 hours.  Invalid input(s): FREET3 Anemia work up: No results for input(s): VITAMINB12, FOLATE, FERRITIN, TIBC, IRON, RETICCTPCT in the last 72 hours. Sepsis Labs: Recent Labs  Lab 04/22/20 0323 04/23/20 0513 04/24/20 0414 04/25/20 0621  PROCALCITON <0.10  --   --   --   WBC 6.2 6.5 5.4 5.1   Microbiology Recent Results (from the past 240 hour(s))  Urine Culture     Status: Abnormal   Collection Time: 04/22/20  2:16 AM   Specimen: Urine, Random  Result Value Ref Range Status   Specimen Description URINE, RANDOM  Final   Special Requests   Final    NONE Performed at [facility], [address], [place], [place] [address]    Culture MULTIPLE SPECIES PRESENT, SUGGEST RECOLLECTION (A)  Final   Report Status 04/23/2020 FINAL  Final  Culture, blood (Routine X 2) w Reflex to ID Panel     Status: None (Preliminary result)   Collection Time: 04/22/20  3:23 AM   Specimen: BLOOD LEFT FOREARM  Result Value Ref Range Status   Specimen Description BLOOD LEFT FOREARM  Final   Special Requests   Final    BOTTLES DRAWN AEROBIC ONLY Blood Culture results may not be optimal due to an inadequate volume of blood received in culture bottles   Culture   Final    NO GROWTH 2 DAYS Performed at [facility], [place] [address], [place], [place] [address]    Report Status PENDING  Incomplete  Culture, blood (Routine X 2) w Reflex to ID Panel     Status: Abnormal (Preliminary result)   Collection Time: 04/22/20  3:33 AM   Specimen: BLOOD  Result Value Ref Range Status   Specimen Description BLOOD LEFT ANTECUBITAL  Final   Special Requests   Final    BOTTLES DRAWN AEROBIC AND ANAEROBIC Blood Culture results may not be optimal due to an  inadequate volume of blood received in culture bottles   Culture  Setup Time   Final    GRAM POSITIVE COCCI IN BOTH AEROBIC AND ANAEROBIC BOTTLES Organism ID to follow CRITICAL RESULT CALLED TO, READ BACK BY AND VERIFIED WITH: PHRMD V BRYK @0237  04/23/20 BY S GEZAHEGN    Culture (A)  Final    STAPHYLOCOCCUS SPECIES (COAGULASE NEGATIVE) CULTURE REINCUBATED FOR BETTER GROWTH Performed at [facility], [place] [address] [place], [place] [address]    Report Status PENDING  Incomplete  Blood Culture ID Panel (Reflexed)     Status: Abnormal   Collection Time: 04/22/20  3:33 AM  Result Value Ref Range Status   Enterococcus species NOT DETECTED NOT DETECTED Final   Listeria monocytogenes NOT DETECTED NOT DETECTED Final   Staphylococcus species DETECTED (A) NOT DETECTED Final    Comment: Methicillin (oxacillin) susceptible coagulase negative staphylococcus. Possible blood culture contaminant (unless isolated from more than one blood culture draw or clinical case suggests pathogenicity). No antibiotic treatment is indicated for blood  culture contaminants. CRITICAL RESULT CALLED TO, READ BACK BY AND VERIFIED WITH: PHARMD V BRYK [id number]    Staphylococcus aureus (BCID) NOT DETECTED NOT DETECTED Final   Methicillin resistance NOT DETECTED NOT DETECTED Final   Streptococcus species NOT DETECTED NOT DETECTED Final   Streptococcus agalactiae NOT DETECTED NOT DETECTED Final   Streptococcus pneumoniae NOT DETECTED NOT DETECTED Final  Streptococcus pyogenes NOT DETECTED NOT DETECTED Final   Acinetobacter baumannii NOT DETECTED NOT DETECTED Final   Enterobacteriaceae species NOT DETECTED NOT DETECTED Final   Enterobacter cloacae complex NOT DETECTED NOT DETECTED Final   Escherichia coli NOT DETECTED NOT DETECTED Final   Klebsiella oxytoca NOT DETECTED NOT DETECTED Final   Klebsiella pneumoniae NOT DETECTED NOT DETECTED Final   Proteus species NOT DETECTED NOT DETECTED Final   Serratia  marcescens NOT DETECTED NOT DETECTED Final   Haemophilus influenzae NOT DETECTED NOT DETECTED Final   Neisseria meningitidis NOT DETECTED NOT DETECTED Final   Pseudomonas aeruginosa NOT DETECTED NOT DETECTED Final   Candida albicans NOT DETECTED NOT DETECTED Final   Candida glabrata NOT DETECTED NOT DETECTED Final   Candida krusei NOT DETECTED NOT DETECTED Final   Candida parapsilosis NOT DETECTED NOT DETECTED Final   Candida tropicalis NOT DETECTED NOT DETECTED Final    Comment: Performed at [facility], [place] [address], [place], [place] [address]  SARS Coronavirus 2 by RT PCR (hospital order, performed in [facility] hospital lab) Nasopharyngeal Nasopharyngeal Swab     Status: None   Collection Time: 04/22/20 12:05 PM   Specimen: Nasopharyngeal Swab  Result Value Ref Range Status   SARS Coronavirus 2 NEGATIVE NEGATIVE Final    Comment: (NOTE) SARS-CoV-2 target nucleic acids are NOT DETECTED.  The SARS-CoV-2 RNA is generally detectable in upper and lower respiratory specimens during the acute phase of infection. The lowest concentration of SARS-CoV-2 viral copies this assay can detect is 250 copies / mL. A negative result does not preclude SARS-CoV-2 infection and should not be used as the sole basis for treatment or other patient management decisions.  A negative result may occur with improper specimen collection / handling, submission of specimen other than nasopharyngeal swab, presence of viral mutation(s) within the areas targeted by this assay, and inadequate number of viral copies (<250 copies / mL). A negative result must be combined with clinical observations, patient history, and epidemiological information.  Fact Sheet for Patients:   StrictlyIdeas.no  Fact Sheet for Healthcare Providers: BankingDealers.co.za  This test is not yet approved or  cleared by the [place] FDA and has been authorized for detection  and/or diagnosis of SARS-CoV-2 by FDA under an Emergency Use Authorization (EUA).  This EUA will remain in effect (meaning this test can be used) for the duration of the COVID-19 declaration under Section 564(b)(1) of the Act, 21 U.S.C. section 360bbb-3(b)(1), unless the authorization is terminated or revoked sooner.  Performed at [facility], [place] [address] [place], [place] [address]      Medications:   . aspirin  81 mg Oral Daily  . carvedilol  3.125 mg Oral BID WC  . enoxaparin (LOVENOX) injection  30 mg Subcutaneous Q24H  . isosorbide-hydrALAZINE  1 tablet Oral TID  . levETIRAcetam  500 mg Oral BID  . tamsulosin  0.4 mg Oral QPC supper   Continuous Infusions:    LOS: 3 days   Charlynne Cousins  [facility]  04/25/2020, 9:28 AM

## 2020-04-26 DIAGNOSIS — N400 Enlarged prostate without lower urinary tract symptoms: Secondary | ICD-10-CM | POA: Diagnosis not present

## 2020-04-26 DIAGNOSIS — I5023 Acute on chronic systolic (congestive) heart failure: Secondary | ICD-10-CM | POA: Diagnosis not present

## 2020-04-26 DIAGNOSIS — N184 Chronic kidney disease, stage 4 (severe): Secondary | ICD-10-CM | POA: Diagnosis not present

## 2020-04-26 DIAGNOSIS — I10 Essential (primary) hypertension: Secondary | ICD-10-CM | POA: Diagnosis not present

## 2020-04-26 LAB — BASIC METABOLIC PANEL
Anion gap: 8 (ref 5–15)
BUN: 50 mg/dL — ABNORMAL HIGH (ref 8–23)
CO2: 28 mmol/L (ref 22–32)
Calcium: 8.8 mg/dL — ABNORMAL LOW (ref 8.9–10.3)
Chloride: 103 mmol/L (ref 98–111)
Creatinine, Ser: 2.5 mg/dL — ABNORMAL HIGH (ref 0.61–1.24)
GFR calc Af Amer: 29 mL/min — ABNORMAL LOW (ref >60)
GFR calc non Af Amer: 25 mL/min — ABNORMAL LOW (ref >60)
Glucose, Bld: 95 mg/dL (ref 70–99)
Potassium: 3.9 mmol/L (ref 3.5–5.1)
Sodium: 139 mmol/L (ref 135–145)

## 2020-04-26 LAB — MAGNESIUM: Magnesium: 2.3 mg/dL (ref 1.7–2.4)

## 2020-04-26 NOTE — Plan of Care (Signed)

## 2020-04-26 NOTE — Progress Notes (Signed)
Patient discharged home.

## 2020-04-26 NOTE — TOC Progression Note (Signed)
Transition of Care [facility]) - Progression Note    Patient Details  Name: Tim Bradley MRN: [id number] Date of Birth: 1951-08-11  Transition of Care [facility]) CM/SW [place], [place] Phone Number: 04/26/2020, 11:07 AM  Clinical Narrative:     CSW spoke with patient at bedside. Patient is very anxious and wants to go home. Patient does not want to go to SNF. Patient told CSW that he would like to go home with home health services. CSW let patient know she will tell casemanager so that she can speak to him about home health services. Patients sister is wanting to come pick him up and take him to the [place] today. Patient told CSW that he does not want to go to [place] that he wants to go home with home health services.  TOC team will continue to follow.  Expected Discharge Plan: [place] Barriers to Discharge: Continued Medical Work up  Expected Discharge Plan and Services Expected Discharge Plan: [place] In-house Referral: Clinical Social Work     Living arrangements for the past 2 months: Single Family Home                                       Social Determinants of Health (SDOH) Interventions    Readmission Risk Interventions Readmission Risk Prevention Plan 07/24/2019  Transportation Screening Complete  PCP or Specialist Appt within 3-5 Days Complete  HRI or [place] Complete  Social Work Consult for [place] Planning/Counseling Complete  Palliative Care Screening Not Applicable  Medication Review [employer] photographer) Complete  Some recent data might be hidden

## 2020-04-26 NOTE — Progress Notes (Signed)
D/C instructions given and reviewed. No questions voiced at this time. Tele and IV removed. Tolerated well. Awaiting case management for [facility] options.

## 2020-04-26 NOTE — Progress Notes (Signed)
[facility] PROGRESS NOTE    Progress Note  Tim Bradley  JEH:[id number] DOB: 1951/04/06 DOA: 04/21/2020 PCP: [facility]     Brief Narrative:   Tim Bradley is an 69 y.o. 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  He did not pick up his med after leaving AMA due to financial reasons came back into the hospital symptomatically in heart failure.  Assessment/Plan:   Acute respiratory failure with hypoxia due to acute exacerbation of CHF (congestive heart failure) ([place]): He has been transitioned to oral Lasix, his basic metabolic panel is pending this morning. He has been satting greater than 94% on room air, He is negative about 5 L. He denies orthopnea. Continue aspirin, Coreg and BiDil. 1 out of 2 blood cultures was positive for staph aureus likely a contaminant he has remained afebrile. Patient is medically stable for transfer to skilled nursing facility.  Generalized weakness: Physical therapy recommended skilled nursing facility, [facility] team working on placement.  Essential hypertension: Continue Coreg and BiDil.  Acute on chronic kidney disease stage IV: With a baseline creatinine around 2.5, basic metabolic panel is pending this morning continue current home dose of Lasix.  History of seizures: Continue Keppra twice a day.  Tobacco abuse/polysubstance abuse: Counseling.  BPH: Continue Flomax.    DVT prophylaxis: lovenox Family Communication:none Status is: Inpatient  Remains inpatient appropriate because:Hemodynamically stable   Dispo: The patient is from: Home              Anticipated d/c is to: SNF              Anticipated d/c date is: 3 days              Patient currently is medically stable  to d/c.  Patient is medically stable for transfer awaiting placement. Code Status:     Code Status Orders  (From admission, onward)         Start     Ordered   04/22/20 0302  Full code  Continuous        04/22/20 0301        Code Status History    Date Active Date Inactive Code Status Order ID Comments User Context   04/11/2020 0620 04/13/2020 1535 Full Code [id number]  Chotiner, Yevonne Aline, MD ED   07/21/2019 1943 07/24/2019 1736 Full Code [id number]  Ileene Musa T, DO ED   08/20/2018 1245 08/22/2018 2038 Full Code [id number]  Valinda Party, DO ED   03/26/2017 1340 03/28/2017 2221 Full Code [id number]  Ivor Reining Inpatient   03/26/2017 0746 03/26/2017 1340 Full Code [id number]  Rivet, Sindy Guadeloupe, MD ED   Advance Care Planning Activity        IV Access:    Peripheral IV   Procedures and diagnostic studies:   No results found.   Medical Consultants:    None.  Anti-Infectives:   none  Subjective:    Tim Bradley no new complaints.  Objective:    Vitals:   04/25/20 2010 04/26/20 0406 04/26/20 0409 04/26/20 0831  BP: 124/65  123/71 (!) 133/81  Pulse: 79  78 71  Resp: 16  18 (!) 0  Temp: 99.3 F (37.4 C)  98.9 F (37.2 C) 98.5 F (36.9 C)  TempSrc:  Oral  Oral Oral  SpO2: 97%  98% 98%  Weight:  (!) 92.4 kg    Height:       SpO2: 98 %   Intake/Output Summary (Last 24 hours) at 04/26/2020 0955 Last data filed at 04/26/2020 0412 Gross per 24 hour  Intake 460 ml  Output 2000 ml  Net -1540 ml   Filed Weights   04/24/20 0455 04/25/20 0347 04/26/20 0406  Weight: (!) 93.2 kg (!) 93.5 kg (!) 92.4 kg    Exam: General exam: In no acute distress. Respiratory system: Good air movement and clear to auscultation. Cardiovascular system: S1 & S2 heard, RRR. No JVD. Gastrointestinal system: Abdomen is nondistended, soft and nontender.  Extremities: No pedal edema. Skin: No rashes, lesions or ulcers  Data Reviewed:    Labs: Basic Metabolic  Panel: Recent Labs  Lab 04/21/20 2328 04/21/20 2328 04/22/20 0323 04/22/20 0323 04/23/20 0513 04/23/20 0513 04/24/20 0414 04/25/20 0621 04/26/20 0426  NA 140  --  142  --  139  --  139 139  --   K 4.3   < > 4.2   < > 4.1   < > 4.0 4.4  --   CL 107  --  108  --  102  --  104 104  --   CO2 25  --  26  --  29  --  27 28  --   GLUCOSE 97  --  117*  --  127*  --  103* 118*  --   BUN 38*  --  37*  --  50*  --  52* 47*  --   CREATININE 2.11*  --  2.24*  --  2.97*  --  2.64* 2.26*  --   CALCIUM 8.9  --  9.0  --  8.8*  --  8.5* 9.1  --   MG  --   --  1.9  --  2.0  --  2.1 2.3 2.3   < > = values in this interval not displayed.   GFR Estimated Creatinine Clearance: 34.9 mL/min (A) (by C-G formula based on SCr of 2.26 mg/dL (H)). Liver Function Tests: Recent Labs  Lab 04/22/20 0323  AST 18  ALT 18  ALKPHOS 53  BILITOT 0.6  PROT 6.0*  ALBUMIN 2.9*   No results for input(s): LIPASE, AMYLASE in the last 168 hours. No results for input(s): AMMONIA in the last 168 hours. Coagulation profile No results for input(s): INR, PROTIME in the last 168 hours. COVID-19 Labs  No results for input(s): DDIMER, FERRITIN, LDH, CRP in the last 72 hours.  Lab Results  Component Value Date   SARSCOV2NAA NEGATIVE 04/25/2020   [place] NEGATIVE 04/22/2020   [place] NEGATIVE 04/11/2020   [place] NEGATIVE 07/21/2019    CBC: Recent Labs  Lab 04/21/20 2328 04/22/20 0323 04/23/20 0513 04/24/20 0414 04/25/20 0621  WBC 5.7 6.2 6.5 5.4 5.1  NEUTROABS 3.3  --   --   --   --   HGB 12.1* 12.1* 12.4* 11.7* 12.0*  HCT 39.7 39.9 39.4 36.3* 38.4*  MCV 91.3 92.8 90.2 90.3 89.3  PLT 223 239 242 247 249   Cardiac Enzymes: No results for input(s): CKTOTAL, CKMB, CKMBINDEX, TROPONINI in the last 168 hours. BNP (last 3 results) No results for input(s): PROBNP in the last 8760 hours. CBG: No results for input(s): GLUCAP in the last 168 hours. D-Dimer: No results for input(s): DDIMER in the  last 72 hours. Hgb A1c: No results  for input(s): HGBA1C in the last 72 hours. Lipid Profile: No results for input(s): CHOL, HDL, LDLCALC, TRIG, CHOLHDL, LDLDIRECT in the last 72 hours. Thyroid function studies: No results for input(s): TSH, T4TOTAL, T3FREE, THYROIDAB in the last 72 hours.  Invalid input(s): FREET3 Anemia work up: No results for input(s): VITAMINB12, FOLATE, FERRITIN, TIBC, IRON, RETICCTPCT in the last 72 hours. Sepsis Labs: Recent Labs  Lab 04/22/20 0323 04/23/20 0513 04/24/20 0414 04/25/20 0621  PROCALCITON <0.10  --   --   --   WBC 6.2 6.5 5.4 5.1   Microbiology Recent Results (from the past 240 hour(s))  Urine Culture     Status: Abnormal   Collection Time: 04/22/20  2:16 AM   Specimen: Urine, Random  Result Value Ref Range Status   Specimen Description URINE, RANDOM  Final   Special Requests   Final    NONE Performed at [facility], [address], [place], [place] [address]    Culture MULTIPLE SPECIES PRESENT, SUGGEST RECOLLECTION (A)  Final   Report Status 04/23/2020 FINAL  Final  Culture, blood (Routine X 2) w Reflex to ID Panel     Status: None (Preliminary result)   Collection Time: 04/22/20  3:23 AM   Specimen: BLOOD LEFT FOREARM  Result Value Ref Range Status   Specimen Description BLOOD LEFT FOREARM  Final   Special Requests   Final    BOTTLES DRAWN AEROBIC ONLY Blood Culture results may not be optimal due to an inadequate volume of blood received in culture bottles   Culture   Final    NO GROWTH 2 DAYS Performed at [facility], [place] [address] [place], [place] [address]    Report Status PENDING  Incomplete  Culture, blood (Routine X 2) w Reflex to ID Panel     Status: Abnormal (Preliminary result)   Collection Time: 04/22/20  3:33 AM   Specimen: BLOOD  Result Value Ref Range Status   Specimen Description BLOOD LEFT ANTECUBITAL  Final   Special Requests   Final    BOTTLES DRAWN AEROBIC AND ANAEROBIC Blood Culture results  may not be optimal due to an inadequate volume of blood received in culture bottles   Culture  Setup Time   Final    GRAM POSITIVE COCCI IN BOTH AEROBIC AND ANAEROBIC BOTTLES Organism ID to follow CRITICAL RESULT CALLED TO, READ BACK BY AND VERIFIED WITH: PHRMD V BRYK @0237  04/23/20 BY S GEZAHEGN    Culture (A)  Final    STAPHYLOCOCCUS SPECIES (COAGULASE NEGATIVE) CULTURE REINCUBATED FOR BETTER GROWTH Performed at [facility], [place] [address] [place], [place] [address]    Report Status PENDING  Incomplete  Blood Culture ID Panel (Reflexed)     Status: Abnormal   Collection Time: 04/22/20  3:33 AM  Result Value Ref Range Status   Enterococcus species NOT DETECTED NOT DETECTED Final   Listeria monocytogenes NOT DETECTED NOT DETECTED Final   Staphylococcus species DETECTED (A) NOT DETECTED Final    Comment: Methicillin (oxacillin) susceptible coagulase negative staphylococcus. Possible blood culture contaminant (unless isolated from more than one blood culture draw or clinical case suggests pathogenicity). No antibiotic treatment is indicated for blood  culture contaminants. CRITICAL RESULT CALLED TO, READ BACK BY AND VERIFIED WITH: PHARMD V BRYK [id number] [id number]    Staphylococcus aureus (BCID) NOT DETECTED NOT DETECTED Final   Methicillin resistance NOT DETECTED NOT DETECTED Final   Streptococcus species NOT DETECTED NOT DETECTED Final  Streptococcus agalactiae NOT DETECTED NOT DETECTED Final   Streptococcus pneumoniae NOT DETECTED NOT DETECTED Final   Streptococcus pyogenes NOT DETECTED NOT DETECTED Final   Acinetobacter baumannii NOT DETECTED NOT DETECTED Final   Enterobacteriaceae species NOT DETECTED NOT DETECTED Final   Enterobacter cloacae complex NOT DETECTED NOT DETECTED Final   Escherichia coli NOT DETECTED NOT DETECTED Final   Klebsiella oxytoca NOT DETECTED NOT DETECTED Final   Klebsiella pneumoniae NOT DETECTED NOT DETECTED Final   Proteus species NOT DETECTED NOT  DETECTED Final   Serratia marcescens NOT DETECTED NOT DETECTED Final   Haemophilus influenzae NOT DETECTED NOT DETECTED Final   Neisseria meningitidis NOT DETECTED NOT DETECTED Final   Pseudomonas aeruginosa NOT DETECTED NOT DETECTED Final   Candida albicans NOT DETECTED NOT DETECTED Final   Candida glabrata NOT DETECTED NOT DETECTED Final   Candida krusei NOT DETECTED NOT DETECTED Final   Candida parapsilosis NOT DETECTED NOT DETECTED Final   Candida tropicalis NOT DETECTED NOT DETECTED Final    Comment: Performed at [facility], [place] [address], [place], [place] [address]  SARS Coronavirus 2 by RT PCR (hospital order, performed in [facility] hospital lab) Nasopharyngeal Nasopharyngeal Swab     Status: None   Collection Time: 04/22/20 12:05 PM   Specimen: Nasopharyngeal Swab  Result Value Ref Range Status   SARS Coronavirus 2 NEGATIVE NEGATIVE Final    Comment: (NOTE) SARS-CoV-2 target nucleic acids are NOT DETECTED.  The SARS-CoV-2 RNA is generally detectable in upper and lower respiratory specimens during the acute phase of infection. The lowest concentration of SARS-CoV-2 viral copies this assay can detect is 250 copies / mL. A negative result does not preclude SARS-CoV-2 infection and should not be used as the sole basis for treatment or other patient management decisions.  A negative result may occur with improper specimen collection / handling, submission of specimen other than nasopharyngeal swab, presence of viral mutation(s) within the areas targeted by this assay, and inadequate number of viral copies (<250 copies / mL). A negative result must be combined with clinical observations, patient history, and epidemiological information.  Fact Sheet for Patients:   StrictlyIdeas.no  Fact Sheet for Healthcare Providers: BankingDealers.co.za  This test is not yet approved or  cleared by the [place] FDA and has been  authorized for detection and/or diagnosis of SARS-CoV-2 by FDA under an Emergency Use Authorization (EUA).  This EUA will remain in effect (meaning this test can be used) for the duration of the COVID-19 declaration under Section 564(b)(1) of the Act, 21 U.S.C. section 360bbb-3(b)(1), unless the authorization is terminated or revoked sooner.  Performed at [facility], [place] [address] [place], [place] [address]   SARS Coronavirus 2 by RT PCR     Status: None   Collection Time: 04/25/20 12:41 PM  Result Value Ref Range Status   SARS Coronavirus 2 NEGATIVE NEGATIVE Final    Comment: (NOTE) Result indicates the ABSENCE of SARS-CoV-2 RNA in the patient specimen.   The lowest concentration of SARS-CoV-2 viral copies this assay can detect in nasopharyngeal swab specimens is 500 copies / mL.  A negative result does not preclude SARS-CoV-2 infection and should not be used as the sole basis for patient management decisions. A negative result may occur with improper specimen collection / handling, submission of a specimen other than nasopharyngeal swab, presence of viral mutation(s) within the areas targeted by this assay, and inadequate number of viral copies (<500 copies / mL) present.  Negative results must be combined with clinical observations, patient history, and epidemiological information.   The expected result is NEGATIVE.   Patient Fact Sheet:  BlogSelections.co.uk    Provider Fact Sheet:  https://lucas.com/    This test is not yet approved or cleared by the [place] FDA and  has been [place] orized for detection and/or diagnosis of SARS-CoV-2 by FDA under an Emergency Use Authorization (EUA).  This EUA will remain in effect (meaning this test can be used) for the duration of  the COVID-19 declaration under Section 564(b)(1) of the Act, 21 U.S.C. section 360bbb-3(b)(1), unless the authorization is terminated or revoked  sooner  Performed at [facility], [place] [address], [place], [place] [address]      Medications:   . aspirin  81 mg Oral Daily  . carvedilol  3.125 mg Oral BID WC  . enoxaparin (LOVENOX) injection  30 mg Subcutaneous Q24H  . furosemide  40 mg Oral Daily  . isosorbide-hydrALAZINE  1 tablet Oral TID  . levETIRAcetam  500 mg Oral BID  . tamsulosin  0.4 mg Oral QPC supper   Continuous Infusions:    LOS: 4 days   Charlynne Cousins  [facility]  04/26/2020, 9:55 AM

## 2020-04-26 NOTE — TOC Transition Note (Signed)
Transition of Care [name]) - CM/SW Discharge Note   Patient Details  Name: Tim Bradley MRN: [id number] Date of Birth: November 17, 1950  [name]) CM/SW Contact:  [name], RN Phone Number: 04/26/2020, 12:32 PM   Clinical Narrative:    Patient is for dc today to home, NCM spoke with patient's daughter, she states he is already with [name], NCM confirmed with [name], informed him we need [facility] [place]. [name] states he can take referral.  Soc will begin 24 to 48 hrs post dc.    Final next level of care: [name] Barriers to Discharge: No Barriers Identified   Patient Goals and CMS Choice Patient states their goals for this hospitalization and ongoing recovery are:: go home CMS Medicare.gov Compare Post Acute Care list provided to:: Patient Represenative (must comment) Choice offered to / list presented to : Adult Children  Discharge Placement                       Discharge Plan and Services In-house Referral: Clinical Social Work                  Date DME Agency Contacted: 04/26/20 Time DME Agency Contacted: [id number] Representative spoke with at DME Agency: [name] HH Arranged: PT, OT [place] Agency: [facility] Date [place]: 04/26/20 Time [place]: 48 Representative spoke with at [place]: [place] ([place]) Interventions     Readmission Risk Interventions Readmission Risk Prevention Plan 07/24/2019  Transportation Screening Complete  PCP or Specialist Appt within 3-5 Days Complete  HRI or [place] Complete  Social Work Consult for [place] Planning/Counseling Complete  Palliative Care Screening Not Applicable  Medication Review Press photographer) Complete  Some recent data might be hidden

## 2020-04-26 NOTE — TOC Progression Note (Signed)
Transition of Care [facility]) - Progression Note    Patient Details  Name: Tim Bradley MRN: [id number] Date of Birth: 1951-05-01  Transition of Care [facility]) CM/SW [place], [place] Phone Number: 04/26/2020, 10:09 AM  Clinical Narrative:     CSW tried to call [facility] to speak with April. CSW left voicemail for April to call back. CSW faxed over clinicals for review for possible SNF placement for patient.  CSW will continue to follow.  Expected Discharge Plan: [place] Barriers to Discharge: Continued Medical Work up  Expected Discharge Plan and Services Expected Discharge Plan: [place] In-house Referral: Clinical Social Work     Living arrangements for the past 2 months: Single Family Home                                       Social Determinants of Health (SDOH) Interventions    Readmission Risk Interventions Readmission Risk Prevention Plan 07/24/2019  Transportation Screening Complete  PCP or Specialist Appt within 3-5 Days Complete  HRI or [place] Complete  Social Work Consult for [place] Planning/Counseling Complete  Palliative Care Screening Not Applicable  Medication Review Press photographer) Complete  Some recent data might be hidden

## 2020-04-26 NOTE — Progress Notes (Signed)
Patient confused at times reports wants to go home does not want to go to SNF reported would alert Social worker. No further changes noted.

## 2020-04-26 NOTE — Discharge Summary (Addendum)
Physician Discharge Summary  Tim [place] OVF:[id number] DOB: 1951-06-10 DOA: 04/21/2020  PCP: [facility]  Admit date: 04/21/2020 Discharge date: 04/26/2020  Admitted From: home Disposition:  Home  Recommendations for Outpatient Follow-up:  1. Follow up with PCP in 1-2 weeks 2. Please obtain BMP/CBC in one week  Home Health:No Equipment/Devices:None  Discharge Condition:Stable CODE STATUS:Full Diet recommendation: Heart Healthy  Brief/Interim Summary: 69 y.o. 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  He did not pick up his med after leaving AMA due to financial reasons came back into the hospital symptomatically in heart failure.  Discharge Diagnoses:  Principal Problem:   Acute combined systolic and diastolic congestive heart failure (HCC) Active Problems:   Seizure (HCC)   Essential hypertension   BPH (benign prostatic hyperplasia)   CKD (chronic kidney disease) stage 4, GFR 15-29 ml/min (HCC)   Generalized weakness  Acute respiratory failure with hypoxia due to acute combined systolic and diastolic decompensated diastolic heart failure: He had a previous 2D echo done on 04/11/2020 that showed an EF of 20%, with grade 1 diastolic heart failure.  This 2D echo was done before he left AGAINST MEDICAL ADVICE and the previous admission. He was started on oxygen IV Lasix and diuresed about 5 L wean to room air. He was continued on aspirin Coreg and BiDil.  He will continue his current dose of oral Lasix as an outpatient.  Generalized weakness: Physical therapy evaluated the patient and recommended skilled nursing facility with the patient refused.  He will go home with physical therapy.  Essential hypertension: No  change made to his medication.  Acute on chronic kidney disease stage IV: With a baseline of 2.5 after diuresing with IV Lasix his creatinine returned to baseline.  History of seizures, Continue Keppra twice a day.  Tobacco abuse: Counseling.  BPH: Continue Flomax.  Discharge Instructions  Discharge Instructions    Diet - low sodium heart healthy   Complete by: As directed    Increase activity slowly   Complete by: As directed      Allergies as of 04/26/2020   No Known Allergies     Medication List    TAKE these medications   aspirin 81 MG EC tablet Take 1 tablet (81 mg total) by mouth daily. Swallow whole.   carvedilol 3.125 MG tablet Commonly known as: COREG Take 1 tablet (3.125 mg total) by mouth 2 (two) times daily with a meal.   furosemide 40 MG tablet Commonly known as: LASIX Take 1 tablet (40 mg total) by mouth daily.   isosorbide-hydrALAZINE 20-37.5 MG tablet Commonly known as: BIDIL Take 1 tablet by mouth 3 (three) times daily.   levETIRAcetam 500 MG tablet Commonly known as: Keppra Take 1 tablet (500 mg total) by mouth 2 (two) times daily.   tamsulosin 0.4 MG Caps capsule Commonly known as: FLOMAX Take 1 capsule (0.4 mg total) by mouth daily after supper.       No Known Allergies  Consultations:  None   Procedures/Studies: DG Chest 2 View  Result Date: 04/10/2020 CLINICAL DATA:  Shortness of breath EXAM: CHEST - 2 VIEW COMPARISON:  2018 FINDINGS: Mild chronic interstitial prominence with pulmonary vascular congestion. No pleural effusion or pneumothorax. Stable cardiomediastinal contours. No acute osseous abnormality. IMPRESSION: Mild pulmonary vascular congestion. Electronically  Signed   By: Macy Mis M.D.   On: 04/10/2020 12:53   CT HEAD WO CONTRAST  Result Date: 04/13/2020 CLINICAL DATA:  69 year old male status post head trauma. EXAM: CT HEAD WITHOUT CONTRAST TECHNIQUE: Contiguous axial images were obtained from the base of the  skull through the vertex without intravenous contrast. COMPARISON:  Brain MRI and head CT 07/21/2019 FINDINGS: Brain: Stable. No midline shift, ventriculomegaly, mass effect, evidence of mass lesion, intracranial hemorrhage or evidence of cortically based acute infarction. Mild patchy bilateral white matter hypodensity. Vascular: Calcified atherosclerosis at the skull base. No suspicious intracranial vascular hyperdensity. Chronic calcified atherosclerosis of the ACA branches suspected and stable. Skull: Stable, intact. Sinuses/Orbits: Visualized paranasal sinuses and mastoids are clear. Other: No acute orbit or scalp soft tissue injury identified. IMPRESSION: 1. No acute intracranial abnormality or acute traumatic injury identified. 2. Stable mild for age white matter changes. Electronically Signed   By: Genevie Ann M.D.   On: 04/13/2020 01:20   DG Chest Portable 1 View  Result Date: 04/22/2020 CLINICAL DATA:  Dyspnea EXAM: PORTABLE CHEST 1 VIEW COMPARISON:  April 13, 2020 FINDINGS: The heart size is mildly enlarged. There are aortic calcifications. There is a right perihilar and infrahilar airspace opacity. There is no pneumothorax. Mild vascular congestion is noted without evidence for large pleural effusion. There are old healed left-sided rib fractures. IMPRESSION: 1. Right perihilar and infrahilar airspace opacity concerning for pneumonia. Follow-up to radiologic resolution is recommended. 2. Mild vascular congestion without overt pulmonary edema. Electronically Signed   By: Constance Holster M.D.   On: 04/22/2020 01:51   DG CHEST PORT 1 VIEW  Result Date: 04/13/2020 CLINICAL DATA:  69 year old male with shortness of breath. EXAM: PORTABLE CHEST 1 VIEW COMPARISON:  Portable chest 04/12/2020 and earlier. FINDINGS: Portable AP semi upright view at 0108 hours. Stable lung volumes and mediastinal contours. Mild diffuse increased interstitial markings appear to be chronic compared to 2018 radiographs. Allowing  for portable technique the lungs are clear. Visualized tracheal air column is within normal limits. No acute osseous abnormality identified. IMPRESSION: No acute cardiopulmonary abnormality. Electronically Signed   By: Genevie Ann M.D.   On: 04/13/2020 03:30   DG CHEST PORT 1 VIEW  Result Date: 04/12/2020 CLINICAL DATA:  Dyspnea. EXAM: PORTABLE CHEST 1 VIEW COMPARISON:  PA and lateral chest 04/10/2020 and 03/26/2017. FINDINGS: Lungs clear. Heart size normal. Aortic atherosclerosis. No pneumothorax or pleural fluid no acute focal bony abnormality. IMPRESSION: No acute disease. Aortic Atherosclerosis (ICD10-I70.0). Electronically Signed   By: Inge Rise M.D.   On: 04/12/2020 15:48   ECHOCARDIOGRAM COMPLETE  Result Date: 04/11/2020    ECHOCARDIOGRAM REPORT   Patient Name:   Tim [place] Channell Date of Exam: 04/11/2020 Medical Rec #:  [id number]     Height:       74.0 in Accession #:    [id number]    Weight:       200.0 lb Date of Birth:  Aug 23, 1951     BSA:          2.173 m Patient Age:    69 years      BP:           156/94 mmHg Patient Gender: M             HR:           71 bpm. Exam Location:  Inpatient Procedure: 2D Echo, Cardiac Doppler and Color Doppler Indications:    Dyspnea  History:  Patient has no prior history of Echocardiogram examinations.                 COPD, Signs/Symptoms:Dyspnea; Risk Factors:Hypertension and                 Diabetes. CKD.  Sonographer:    Clayton Lefort RDCS (AE) Referring Phys: [id number] [place]  1. Left ventricular ejection fraction, by estimation, is <20%. The left ventricle has severely decreased function. The left ventricle demonstrates global hypokinesis. There is moderate concentric left ventricular hypertrophy. Left ventricular diastolic parameters are consistent with Grade I diastolic dysfunction (impaired relaxation).  2. Right ventricular systolic function is mildly reduced. The right ventricular size is normal. There is normal pulmonary artery  systolic pressure.  3. Left atrial size was mildly dilated.  4. The mitral valve is normal in structure. Trivial mitral valve regurgitation. No evidence of mitral stenosis.  5. The aortic valve is tricuspid. Aortic valve regurgitation is not visualized. No aortic stenosis is present.  6. Aortic dilatation noted. Aneurysm of the ascending aorta, measuring 41 mm. There is mild dilatation at the level of the sinuses of Valsalva measuring 37 mm.  7. The inferior vena cava is dilated in size with >50% respiratory variability, suggesting right atrial pressure of 8 mmHg. FINDINGS  Left Ventricle: Left ventricular ejection fraction, by estimation, is <20%. The left ventricle has severely decreased function. The left ventricle demonstrates global hypokinesis. The left ventricular internal cavity size was normal in size. There is moderate concentric left ventricular hypertrophy. Left ventricular diastolic parameters are consistent with Grade I diastolic dysfunction (impaired relaxation). Right Ventricle: The right ventricular size is normal. No increase in right ventricular wall thickness. Right ventricular systolic function is mildly reduced. There is normal pulmonary artery systolic pressure. The tricuspid regurgitant velocity is 2.35 m/s, and with an assumed right atrial pressure of 8 mmHg, the estimated right ventricular systolic pressure is 02.7 mmHg. Left Atrium: Left atrial size was mildly dilated. Right Atrium: Right atrial size was normal in size. Pericardium: There is no evidence of pericardial effusion. Mitral Valve: The mitral valve is normal in structure. Normal mobility of the mitral valve leaflets. Trivial mitral valve regurgitation. No evidence of mitral valve stenosis. Tricuspid Valve: The tricuspid valve is normal in structure. Tricuspid valve regurgitation is trivial. No evidence of tricuspid stenosis. Aortic Valve: The aortic valve is tricuspid. Aortic valve regurgitation is not visualized. No aortic  stenosis is present. Aortic valve mean gradient measures 2.0 mmHg. Aortic valve peak gradient measures 3.4 mmHg. Aortic valve area, by VTI measures 2.39 cm. Pulmonic Valve: The pulmonic valve was normal in structure. Pulmonic valve regurgitation is not visualized. No evidence of pulmonic stenosis. Aorta: Aortic dilatation noted. There is mild dilatation at the level of the sinuses of Valsalva measuring 37 mm. There is an aneurysm involving the ascending aorta. The aneurysm measures 41 mm. Venous: The inferior vena cava is dilated in size with greater than 50% respiratory variability, suggesting right atrial pressure of 8 mmHg. IAS/Shunts: No atrial level shunt detected by color flow Doppler.  LEFT VENTRICLE PLAX 2D LVIDd:         4.60 cm LVIDs:         4.20 cm LV PW:         1.29 cm LV IVS:        1.46 cm LVOT diam:     2.00 cm LV SV:         38 LV SV Index:  17 LVOT Area:     3.14 cm  RIGHT VENTRICLE             IVC RV Basal diam:  3.20 cm     IVC diam: 2.20 cm RV S prime:     13.70 cm/s TAPSE (M-mode): 2.1 cm LEFT ATRIUM           Index       RIGHT ATRIUM           Index LA diam:      4.20 cm 1.93 cm/m  RA Area:     15.80 cm LA Vol (A4C): 71.8 ml 33.03 ml/m RA Volume:   38.90 ml  17.90 ml/m  AORTIC VALVE AV Area (Vmax):    2.62 cm AV Area (Vmean):   2.61 cm AV Area (VTI):     2.39 cm AV Vmax:           91.90 cm/s AV Vmean:          61.000 cm/s AV VTI:            0.158 m AV Peak Grad:      3.4 mmHg AV Mean Grad:      2.0 mmHg LVOT Vmax:         76.60 cm/s LVOT Vmean:        50.700 cm/s LVOT VTI:          0.120 m LVOT/AV VTI ratio: 0.76  AORTA Ao Root diam: 3.70 cm Ao Asc diam:  4.10 cm TRICUSPID VALVE TR Peak grad:   22.1 mmHg TR Vmax:        235.00 cm/s  SHUNTS Systemic VTI:  0.12 m Systemic Diam: 2.00 cm Skeet Latch MD Electronically signed by Skeet Latch MD Signature Date/Time: 04/11/2020/6:29:12 PM    Final     Subjective: No complains  Discharge Exam: Vitals:   04/26/20 0831 04/26/20  0900  BP: (!) 133/81   Pulse: 71   Resp: 20 20  Temp: 98.5 F (36.9 C)   SpO2: 98%    Vitals:   04/26/20 0406 04/26/20 0409 04/26/20 0831 04/26/20 0900  BP:  123/71 (!) 133/81   Pulse:  78 71   Resp:  18 20 20   Temp:  98.9 F (37.2 C) 98.5 F (36.9 C)   TempSrc:  Oral Oral   SpO2:  98% 98%   Weight: (!) 92.4 kg     Height:        General: Pt is alert, awake, not in acute distress Cardiovascular: RRR, S1/S2 +, no rubs, no gallops Respiratory: CTA bilaterally, no wheezing, no rhonchi Abdominal: Soft, NT, ND, bowel sounds + Extremities: no edema, no cyanosis    The results of significant diagnostics from this hospitalization (including imaging, microbiology, ancillary and laboratory) are listed below for reference.     Microbiology: Recent Results (from the past 240 hour(s))  Urine Culture     Status: Abnormal   Collection Time: 04/22/20  2:16 AM   Specimen: Urine, Random  Result Value Ref Range Status   Specimen Description URINE, RANDOM  Final   Special Requests   Final    NONE Performed at [facility], [address], [place], [place] [address]    Culture MULTIPLE SPECIES PRESENT, SUGGEST RECOLLECTION (A)  Final   Report Status 04/23/2020 FINAL  Final  Culture, blood (Routine X 2) w Reflex to ID Panel     Status: None (Preliminary result)   Collection Time: 04/22/20  3:23 AM   Specimen: BLOOD LEFT FOREARM  Result Value Ref Range Status   Specimen Description BLOOD LEFT FOREARM  Final   Special Requests   Final    BOTTLES DRAWN AEROBIC ONLY Blood Culture results may not be optimal due to an inadequate volume of blood received in culture bottles   Culture   Final    NO GROWTH 2 DAYS Performed at [facility], [place] [address], [place], [place] [address]    Report Status PENDING  Incomplete  Culture, blood (Routine X 2) w Reflex to ID Panel     Status: Abnormal (Preliminary result)   Collection Time: 04/22/20  3:33 AM   Specimen: BLOOD  Result  Value Ref Range Status   Specimen Description BLOOD LEFT ANTECUBITAL  Final   Special Requests   Final    BOTTLES DRAWN AEROBIC AND ANAEROBIC Blood Culture results may not be optimal due to an inadequate volume of blood received in culture bottles   Culture  Setup Time   Final    GRAM POSITIVE COCCI IN BOTH AEROBIC AND ANAEROBIC BOTTLES Organism ID to follow CRITICAL RESULT CALLED TO, READ BACK BY AND VERIFIED WITH: PHRMD V BRYK @0237  04/23/20 BY S GEZAHEGN    Culture (A)  Final    STAPHYLOCOCCUS SPECIES (COAGULASE NEGATIVE) CULTURE REINCUBATED FOR BETTER GROWTH Performed at [facility], [place] [address], [place], [place] [address]    Report Status PENDING  Incomplete  Blood Culture ID Panel (Reflexed)     Status: Abnormal   Collection Time: 04/22/20  3:33 AM  Result Value Ref Range Status   Enterococcus species NOT DETECTED NOT DETECTED Final   Listeria monocytogenes NOT DETECTED NOT DETECTED Final   Staphylococcus species DETECTED (A) NOT DETECTED Final    Comment: Methicillin (oxacillin) susceptible coagulase negative staphylococcus. Possible blood culture contaminant (unless isolated from more than one blood culture draw or clinical case suggests pathogenicity). No antibiotic treatment is indicated for blood  culture contaminants. CRITICAL RESULT CALLED TO, READ BACK BY AND VERIFIED WITH: PHARMD V BRYK [id number]    Staphylococcus aureus (BCID) NOT DETECTED NOT DETECTED Final   Methicillin resistance NOT DETECTED NOT DETECTED Final   Streptococcus species NOT DETECTED NOT DETECTED Final   Streptococcus agalactiae NOT DETECTED NOT DETECTED Final   Streptococcus pneumoniae NOT DETECTED NOT DETECTED Final   Streptococcus pyogenes NOT DETECTED NOT DETECTED Final   Acinetobacter baumannii NOT DETECTED NOT DETECTED Final   Enterobacteriaceae species NOT DETECTED NOT DETECTED Final   Enterobacter cloacae complex NOT DETECTED NOT DETECTED Final   Escherichia coli NOT DETECTED  NOT DETECTED Final   Klebsiella oxytoca NOT DETECTED NOT DETECTED Final   Klebsiella pneumoniae NOT DETECTED NOT DETECTED Final   Proteus species NOT DETECTED NOT DETECTED Final   Serratia marcescens NOT DETECTED NOT DETECTED Final   Haemophilus influenzae NOT DETECTED NOT DETECTED Final   Neisseria meningitidis NOT DETECTED NOT DETECTED Final   Pseudomonas aeruginosa NOT DETECTED NOT DETECTED Final   Candida albicans NOT DETECTED NOT DETECTED Final   Candida glabrata NOT DETECTED NOT DETECTED Final   Candida krusei NOT DETECTED NOT DETECTED Final   Candida parapsilosis NOT DETECTED NOT DETECTED Final   Candida tropicalis NOT DETECTED NOT DETECTED Final    Comment: Performed at [facility], [address], [place], [place] [address]  SARS Coronavirus 2 by RT PCR (hospital order, performed in [facility] hospital lab) Nasopharyngeal Nasopharyngeal Swab  Status: None   Collection Time: 04/22/20 12:05 PM   Specimen: Nasopharyngeal Swab  Result Value Ref Range Status   SARS Coronavirus 2 NEGATIVE NEGATIVE Final    Comment: (NOTE) SARS-CoV-2 target nucleic acids are NOT DETECTED.  The SARS-CoV-2 RNA is generally detectable in upper and lower respiratory specimens during the acute phase of infection. The lowest concentration of SARS-CoV-2 viral copies this assay can detect is 250 copies / mL. A negative result does not preclude SARS-CoV-2 infection and should not be used as the sole basis for treatment or other patient management decisions.  A negative result may occur with improper specimen collection / handling, submission of specimen other than nasopharyngeal swab, presence of viral mutation(s) within the areas targeted by this assay, and inadequate number of viral copies (<250 copies / mL). A negative result must be combined with clinical observations, patient history, and epidemiological information.  Fact Sheet for Patients:    StrictlyIdeas.no  Fact Sheet for Healthcare Providers: BankingDealers.co.za  This test is not yet approved or  cleared by the [place] FDA and has been authorized for detection and/or diagnosis of SARS-CoV-2 by FDA under an Emergency Use Authorization (EUA).  This EUA will remain in effect (meaning this test can be used) for the duration of the COVID-19 declaration under Section 564(b)(1) of the Act, 21 U.S.C. section 360bbb-3(b)(1), unless the authorization is terminated or revoked sooner.  Performed at [facility], [place] [address], [place], [place] [address]   SARS Coronavirus 2 by RT PCR     Status: None   Collection Time: 04/25/20 12:41 PM  Result Value Ref Range Status   SARS Coronavirus 2 NEGATIVE NEGATIVE Final    Comment: (NOTE) Result indicates the ABSENCE of SARS-CoV-2 RNA in the patient specimen.   The lowest concentration of SARS-CoV-2 viral copies this assay can detect in nasopharyngeal swab specimens is 500 copies / mL.  A negative result does not preclude SARS-CoV-2 infection and should not be used as the sole basis for patient management decisions. A negative result may occur with improper specimen collection / handling, submission of a specimen other than nasopharyngeal swab, presence of viral mutation(s) within the areas targeted by this assay, and inadequate number of viral copies (<500 copies / mL) present.  Negative results must be combined with clinical observations, patient history, and epidemiological information.   The expected result is NEGATIVE.   Patient Fact Sheet:  BlogSelections.co.uk    Provider Fact Sheet:  https://lucas.com/    This test is not yet approved or cleared by the [place] FDA and  has been [place] orized for detection and/or diagnosis of SARS-CoV-2 by FDA under an Emergency Use Authorization (EUA).  This EUA will remain in  effect (meaning this test can be used) for the duration of  the COVID-19 declaration under Section 564(b)(1) of the Act, 21 U.S.C. section 360bbb-3(b)(1), unless the authorization is terminated or revoked sooner  Performed at [facility], [place] [address], [place], [place] [address]      Labs: BNP (last 3 results) Recent Labs    04/10/20 2234 04/21/20 2328  BNP 1,366.8* 101.7*   Basic Metabolic Panel: Recent Labs  Lab 04/21/20 2328 04/22/20 0323 04/23/20 0513 04/24/20 0414 04/25/20 0621 04/26/20 0426  NA 140 142 139 139 139  --   K 4.3 4.2 4.1 4.0 4.4  --   CL 107 108 102 104 104  --   CO2 25 26 29 27 28   --  GLUCOSE 97 117* 127* 103* 118*  --   BUN 38* 37* 50* 52* 47*  --   CREATININE 2.11* 2.24* 2.97* 2.64* 2.26*  --   CALCIUM 8.9 9.0 8.8* 8.5* 9.1  --   MG  --  1.9 2.0 2.1 2.3 2.3   Liver Function Tests: Recent Labs  Lab 04/22/20 0323  AST 18  ALT 18  ALKPHOS 53  BILITOT 0.6  PROT 6.0*  ALBUMIN 2.9*   No results for input(s): LIPASE, AMYLASE in the last 168 hours. No results for input(s): AMMONIA in the last 168 hours. CBC: Recent Labs  Lab 04/21/20 2328 04/22/20 0323 04/23/20 0513 04/24/20 0414 04/25/20 0621  WBC 5.7 6.2 6.5 5.4 5.1  NEUTROABS 3.3  --   --   --   --   HGB 12.1* 12.1* 12.4* 11.7* 12.0*  HCT 39.7 39.9 39.4 36.3* 38.4*  MCV 91.3 92.8 90.2 90.3 89.3  PLT 223 239 242 247 249   Cardiac Enzymes: No results for input(s): CKTOTAL, CKMB, CKMBINDEX, TROPONINI in the last 168 hours. BNP: Invalid input(s): POCBNP CBG: No results for input(s): GLUCAP in the last 168 hours. D-Dimer No results for input(s): DDIMER in the last 72 hours. Hgb A1c No results for input(s): HGBA1C in the last 72 hours. Lipid Profile No results for input(s): CHOL, HDL, LDLCALC, TRIG, CHOLHDL, LDLDIRECT in the last 72 hours. Thyroid function studies No results for input(s): TSH, T4TOTAL, T3FREE, THYROIDAB in the last 72 hours.  Invalid input(s):  FREET3 Anemia work up No results for input(s): VITAMINB12, FOLATE, FERRITIN, TIBC, IRON, RETICCTPCT in the last 72 hours. Urinalysis    Component Value Date/Time   COLORURINE YELLOW 04/21/2020 2216   APPEARANCEUR CLEAR 04/21/2020 2216   LABSPEC 1.014 04/21/2020 2216   PHURINE 7.0 04/21/2020 2216   GLUCOSEU NEGATIVE 04/21/2020 2216   HGBUR NEGATIVE 04/21/2020 2216   [place] 04/21/2020 2216   [place] 04/21/2020 2216   PROTEINUR 100 (A) 04/21/2020 2216   UROBILINOGEN 0.2 03/11/2012 2012   NITRITE NEGATIVE 04/21/2020 2216   LEUKOCYTESUR SMALL (A) 04/21/2020 2216   Sepsis Labs Invalid input(s): PROCALCITONIN,  WBC,  LACTICIDVEN Microbiology Recent Results (from the past 240 hour(s))  Urine Culture     Status: Abnormal   Collection Time: 04/22/20  2:16 AM   Specimen: Urine, Random  Result Value Ref Range Status   Specimen Description URINE, RANDOM  Final   Special Requests   Final    NONE Performed at [facility], [place] [address], [place], [place] [address]    Culture MULTIPLE SPECIES PRESENT, SUGGEST RECOLLECTION (A)  Final   Report Status 04/23/2020 FINAL  Final  Culture, blood (Routine X 2) w Reflex to ID Panel     Status: None (Preliminary result)   Collection Time: 04/22/20  3:23 AM   Specimen: BLOOD LEFT FOREARM  Result Value Ref Range Status   Specimen Description BLOOD LEFT FOREARM  Final   Special Requests   Final    BOTTLES DRAWN AEROBIC ONLY Blood Culture results may not be optimal due to an inadequate volume of blood received in culture bottles   Culture   Final    NO GROWTH 2 DAYS Performed at [facility], [place] [address] [place], [place] [address]    Report Status PENDING  Incomplete  Culture, blood (Routine X 2) w Reflex to ID Panel     Status: Abnormal (Preliminary result)   Collection Time: 04/22/20  3:33 AM  Specimen: BLOOD  Result Value Ref Range Status   Specimen Description BLOOD LEFT ANTECUBITAL  Final   Special  Requests   Final    BOTTLES DRAWN AEROBIC AND ANAEROBIC Blood Culture results may not be optimal due to an inadequate volume of blood received in culture bottles   Culture  Setup Time   Final    GRAM POSITIVE COCCI IN BOTH AEROBIC AND ANAEROBIC BOTTLES Organism ID to follow CRITICAL RESULT CALLED TO, READ BACK BY AND VERIFIED WITH: PHRMD V BRYK @0237  04/23/20 BY S GEZAHEGN    Culture (A)  Final    STAPHYLOCOCCUS SPECIES (COAGULASE NEGATIVE) CULTURE REINCUBATED FOR BETTER GROWTH Performed at [facility], [place] [address] [place], [place] [address]    Report Status PENDING  Incomplete  Blood Culture ID Panel (Reflexed)     Status: Abnormal   Collection Time: 04/22/20  3:33 AM  Result Value Ref Range Status   Enterococcus species NOT DETECTED NOT DETECTED Final   Listeria monocytogenes NOT DETECTED NOT DETECTED Final   Staphylococcus species DETECTED (A) NOT DETECTED Final    Comment: Methicillin (oxacillin) susceptible coagulase negative staphylococcus. Possible blood culture contaminant (unless isolated from more than one blood culture draw or clinical case suggests pathogenicity). No antibiotic treatment is indicated for blood  culture contaminants. CRITICAL RESULT CALLED TO, READ BACK BY AND VERIFIED WITH: PHARMD V BRYK [phone] [id number]    Staphylococcus aureus (BCID) NOT DETECTED NOT DETECTED Final   Methicillin resistance NOT DETECTED NOT DETECTED Final   Streptococcus species NOT DETECTED NOT DETECTED Final   Streptococcus agalactiae NOT DETECTED NOT DETECTED Final   Streptococcus pneumoniae NOT DETECTED NOT DETECTED Final   Streptococcus pyogenes NOT DETECTED NOT DETECTED Final   Acinetobacter baumannii NOT DETECTED NOT DETECTED Final   Enterobacteriaceae species NOT DETECTED NOT DETECTED Final   Enterobacter cloacae complex NOT DETECTED NOT DETECTED Final   Escherichia coli NOT DETECTED NOT DETECTED Final   Klebsiella oxytoca NOT DETECTED NOT DETECTED Final   Klebsiella  pneumoniae NOT DETECTED NOT DETECTED Final   Proteus species NOT DETECTED NOT DETECTED Final   Serratia marcescens NOT DETECTED NOT DETECTED Final   Haemophilus influenzae NOT DETECTED NOT DETECTED Final   Neisseria meningitidis NOT DETECTED NOT DETECTED Final   Pseudomonas aeruginosa NOT DETECTED NOT DETECTED Final   Candida albicans NOT DETECTED NOT DETECTED Final   Candida glabrata NOT DETECTED NOT DETECTED Final   Candida krusei NOT DETECTED NOT DETECTED Final   Candida parapsilosis NOT DETECTED NOT DETECTED Final   Candida tropicalis NOT DETECTED NOT DETECTED Final    Comment: Performed at [facility], [address], [place], [place] [address]  SARS Coronavirus 2 by RT PCR (hospital order, performed in [facility] hospital lab) Nasopharyngeal Nasopharyngeal Swab     Status: None   Collection Time: 04/22/20 12:05 PM   Specimen: Nasopharyngeal Swab  Result Value Ref Range Status   SARS Coronavirus 2 NEGATIVE NEGATIVE Final    Comment: (NOTE) SARS-CoV-2 target nucleic acids are NOT DETECTED.  The SARS-CoV-2 RNA is generally detectable in upper and lower respiratory specimens during the acute phase of infection. The lowest concentration of SARS-CoV-2 viral copies this assay can detect is 250 copies / mL. A negative result does not preclude SARS-CoV-2 infection and should not be used as the sole basis for treatment or other patient management decisions.  A negative result may occur with improper specimen collection / handling, submission of specimen other than nasopharyngeal swab,  presence of viral mutation(s) within the areas targeted by this assay, and inadequate number of viral copies (<250 copies / mL). A negative result must be combined with clinical observations, patient history, and epidemiological information.  Fact Sheet for Patients:   StrictlyIdeas.no  Fact Sheet for Healthcare Providers: BankingDealers.co.za  This  test is not yet approved or  cleared by the [place] FDA and has been authorized for detection and/or diagnosis of SARS-CoV-2 by FDA under an Emergency Use Authorization (EUA).  This EUA will remain in effect (meaning this test can be used) for the duration of the COVID-19 declaration under Section 564(b)(1) of the Act, 21 U.S.C. section 360bbb-3(b)(1), unless the authorization is terminated or revoked sooner.  Performed at [facility], [place] [address] [place], [place] [address]   SARS Coronavirus 2 by RT PCR     Status: None   Collection Time: 04/25/20 12:41 PM  Result Value Ref Range Status   SARS Coronavirus 2 NEGATIVE NEGATIVE Final    Comment: (NOTE) Result indicates the ABSENCE of SARS-CoV-2 RNA in the patient specimen.   The lowest concentration of SARS-CoV-2 viral copies this assay can detect in nasopharyngeal swab specimens is 500 copies / mL.  A negative result does not preclude SARS-CoV-2 infection and should not be used as the sole basis for patient management decisions. A negative result may occur with improper specimen collection / handling, submission of a specimen other than nasopharyngeal swab, presence of viral mutation(s) within the areas targeted by this assay, and inadequate number of viral copies (<500 copies / mL) present.  Negative results must be combined with clinical observations, patient history, and epidemiological information.   The expected result is NEGATIVE.   Patient Fact Sheet:  BlogSelections.co.uk    Provider Fact Sheet:  https://lucas.com/    This test is not yet approved or cleared by the [place] FDA and  has been [place] orized for detection and/or diagnosis of SARS-CoV-2 by FDA under an Emergency Use Authorization (EUA).  This EUA will remain in effect (meaning this test can be used) for the duration of  the COVID-19 declaration under Section 564(b)(1) of the Act, 21 U.S.C.  section 360bbb-3(b)(1), unless the authorization is terminated or revoked sooner  Performed at [facility], [place] [address], [place], [place] [address]      Time coordinating discharge: Over 30 minutes  SIGNED:   Charlynne Cousins, MD  [facility] 04/26/2020, 11:17 AM Pager   If 7PM-7AM, please contact night-coverage [email or web address] Password [email or web address]

## 2020-04-26 NOTE — Progress Notes (Signed)
OT Cancellation Note  Patient Details Name: Tim Bradley MRN: [id number] DOB: Oct 31, 1950   Cancelled Treatment:    Reason Eval/Treat Not Completed: Other (comment) (Patient d/c home). Patient declined SNF placement with desire to d/c home with [facility] therapies. Attempted to see patient to provide education prior to d/c but patient already gone.   Gloris Manchester OTR/L Supplemental OT, Department of rehab services [phone]  Danille Oppedisano R H. 04/26/2020, 12:50 PM

## 2020-04-27 LAB — CULTURE, BLOOD (ROUTINE X 2): Culture: NO GROWTH

## 2020-04-30 LAB — CULTURE, BLOOD (ROUTINE X 2)

## 2020-05-02 ENCOUNTER — Inpatient Hospital Stay (HOSPITAL_COMMUNITY): Payer: No Typology Code available for payment source

## 2020-05-02 ENCOUNTER — Encounter (HOSPITAL_COMMUNITY): Payer: Self-pay

## 2020-05-02 ENCOUNTER — Emergency Department (HOSPITAL_COMMUNITY): Payer: No Typology Code available for payment source

## 2020-05-02 ENCOUNTER — Inpatient Hospital Stay (HOSPITAL_COMMUNITY)
Admission: EM | Admit: 2020-05-02 | Discharge: 2020-05-09 | DRG: 291 | Disposition: A | Discharge status: Skilled Nursing Facility | Payer: No Typology Code available for payment source | Attending: Internal Medicine | Admitting: Internal Medicine

## 2020-05-02 DIAGNOSIS — Z91128 Patient's intentional underdosing of medication regimen for other reason: Secondary | ICD-10-CM

## 2020-05-02 DIAGNOSIS — I712 Thoracic aortic aneurysm, without rupture: Secondary | ICD-10-CM | POA: Diagnosis present

## 2020-05-02 DIAGNOSIS — I509 Heart failure, unspecified: Secondary | ICD-10-CM | POA: Diagnosis not present

## 2020-05-02 DIAGNOSIS — R9431 Abnormal electrocardiogram [ECG] [EKG]: Secondary | ICD-10-CM

## 2020-05-02 DIAGNOSIS — I130 Hypertensive heart and chronic kidney disease with heart failure and stage 1 through stage 4 chronic kidney disease, or unspecified chronic kidney disease: Principal | ICD-10-CM | POA: Diagnosis present

## 2020-05-02 DIAGNOSIS — F1010 Alcohol abuse, uncomplicated: Secondary | ICD-10-CM | POA: Diagnosis present

## 2020-05-02 DIAGNOSIS — G9340 Encephalopathy, unspecified: Secondary | ICD-10-CM

## 2020-05-02 DIAGNOSIS — Z20822 Contact with and (suspected) exposure to covid-19: Secondary | ICD-10-CM | POA: Diagnosis present

## 2020-05-02 DIAGNOSIS — M4802 Spinal stenosis, cervical region: Secondary | ICD-10-CM | POA: Diagnosis present

## 2020-05-02 DIAGNOSIS — Z79899 Other long term (current) drug therapy: Secondary | ICD-10-CM | POA: Diagnosis not present

## 2020-05-02 DIAGNOSIS — I255 Ischemic cardiomyopathy: Secondary | ICD-10-CM | POA: Diagnosis present

## 2020-05-02 DIAGNOSIS — I5043 Acute on chronic combined systolic (congestive) and diastolic (congestive) heart failure: Secondary | ICD-10-CM | POA: Diagnosis not present

## 2020-05-02 DIAGNOSIS — Y929 Unspecified place or not applicable: Secondary | ICD-10-CM | POA: Diagnosis not present

## 2020-05-02 DIAGNOSIS — I5042 Chronic combined systolic (congestive) and diastolic (congestive) heart failure: Secondary | ICD-10-CM | POA: Diagnosis present

## 2020-05-02 DIAGNOSIS — I493 Ventricular premature depolarization: Secondary | ICD-10-CM | POA: Diagnosis present

## 2020-05-02 DIAGNOSIS — R197 Diarrhea, unspecified: Secondary | ICD-10-CM

## 2020-05-02 DIAGNOSIS — I447 Left bundle-branch block, unspecified: Secondary | ICD-10-CM | POA: Diagnosis present

## 2020-05-02 DIAGNOSIS — F17210 Nicotine dependence, cigarettes, uncomplicated: Secondary | ICD-10-CM | POA: Diagnosis present

## 2020-05-02 DIAGNOSIS — M9972 Connective tissue and disc stenosis of intervertebral foramina of thoracic region: Secondary | ICD-10-CM | POA: Diagnosis not present

## 2020-05-02 DIAGNOSIS — Z7982 Long term (current) use of aspirin: Secondary | ICD-10-CM

## 2020-05-02 DIAGNOSIS — R531 Weakness: Secondary | ICD-10-CM | POA: Diagnosis not present

## 2020-05-02 DIAGNOSIS — G9520 Unspecified cord compression: Secondary | ICD-10-CM | POA: Diagnosis present

## 2020-05-02 DIAGNOSIS — I2510 Atherosclerotic heart disease of native coronary artery without angina pectoris: Secondary | ICD-10-CM | POA: Diagnosis present

## 2020-05-02 DIAGNOSIS — M549 Dorsalgia, unspecified: Secondary | ICD-10-CM | POA: Diagnosis present

## 2020-05-02 DIAGNOSIS — I5041 Acute combined systolic (congestive) and diastolic (congestive) heart failure: Secondary | ICD-10-CM | POA: Diagnosis not present

## 2020-05-02 DIAGNOSIS — L8990 Pressure ulcer of unspecified site, unspecified stage: Secondary | ICD-10-CM | POA: Insufficient documentation

## 2020-05-02 DIAGNOSIS — M79606 Pain in leg, unspecified: Secondary | ICD-10-CM | POA: Diagnosis present

## 2020-05-02 DIAGNOSIS — Z9119 Patient's noncompliance with other medical treatment and regimen: Secondary | ICD-10-CM

## 2020-05-02 DIAGNOSIS — R569 Unspecified convulsions: Secondary | ICD-10-CM

## 2020-05-02 DIAGNOSIS — F1410 Cocaine abuse, uncomplicated: Secondary | ICD-10-CM | POA: Diagnosis present

## 2020-05-02 DIAGNOSIS — G9341 Metabolic encephalopathy: Secondary | ICD-10-CM | POA: Diagnosis present

## 2020-05-02 DIAGNOSIS — L89312 Pressure ulcer of right buttock, stage 2: Secondary | ICD-10-CM | POA: Diagnosis present

## 2020-05-02 DIAGNOSIS — M4804 Spinal stenosis, thoracic region: Secondary | ICD-10-CM | POA: Diagnosis present

## 2020-05-02 DIAGNOSIS — T426X6A Underdosing of other antiepileptic and sedative-hypnotic drugs, initial encounter: Secondary | ICD-10-CM | POA: Diagnosis present

## 2020-05-02 DIAGNOSIS — I5022 Chronic systolic (congestive) heart failure: Secondary | ICD-10-CM | POA: Diagnosis not present

## 2020-05-02 DIAGNOSIS — Z638 Other specified problems related to primary support group: Secondary | ICD-10-CM

## 2020-05-02 DIAGNOSIS — N400 Enlarged prostate without lower urinary tract symptoms: Secondary | ICD-10-CM | POA: Diagnosis present

## 2020-05-02 DIAGNOSIS — G40909 Epilepsy, unspecified, not intractable, without status epilepticus: Secondary | ICD-10-CM | POA: Diagnosis present

## 2020-05-02 DIAGNOSIS — Z8249 Family history of ischemic heart disease and other diseases of the circulatory system: Secondary | ICD-10-CM | POA: Diagnosis not present

## 2020-05-02 DIAGNOSIS — Z9181 History of falling: Secondary | ICD-10-CM

## 2020-05-02 DIAGNOSIS — E1122 Type 2 diabetes mellitus with diabetic chronic kidney disease: Secondary | ICD-10-CM | POA: Diagnosis present

## 2020-05-02 DIAGNOSIS — R4182 Altered mental status, unspecified: Secondary | ICD-10-CM | POA: Diagnosis not present

## 2020-05-02 DIAGNOSIS — N184 Chronic kidney disease, stage 4 (severe): Secondary | ICD-10-CM | POA: Diagnosis present

## 2020-05-02 DIAGNOSIS — R296 Repeated falls: Secondary | ICD-10-CM | POA: Diagnosis present

## 2020-05-02 DIAGNOSIS — G959 Disease of spinal cord, unspecified: Secondary | ICD-10-CM | POA: Diagnosis not present

## 2020-05-02 DIAGNOSIS — Z993 Dependence on wheelchair: Secondary | ICD-10-CM

## 2020-05-02 DIAGNOSIS — I10 Essential (primary) hypertension: Secondary | ICD-10-CM | POA: Diagnosis not present

## 2020-05-02 LAB — CBC WITH DIFFERENTIAL/PLATELET
Abs Immature Granulocytes: 0.02 10*3/uL (ref 0.00–0.07)
Basophils Absolute: 0.1 10*3/uL (ref 0.0–0.1)
Basophils Relative: 1 %
Eosinophils Absolute: 0.2 10*3/uL (ref 0.0–0.5)
Eosinophils Relative: 4 %
HCT: 38.2 % — ABNORMAL LOW (ref 39.0–52.0)
Hemoglobin: 11.9 g/dL — ABNORMAL LOW (ref 13.0–17.0)
Immature Granulocytes: 0 %
Lymphocytes Relative: 20 %
Lymphs Abs: 1.2 10*3/uL (ref 0.7–4.0)
MCH: 28.5 pg (ref 26.0–34.0)
MCHC: 31.2 g/dL (ref 30.0–36.0)
MCV: 91.4 fL (ref 80.0–100.0)
Monocytes Absolute: 0.9 10*3/uL (ref 0.1–1.0)
Monocytes Relative: 15 %
Neutro Abs: 3.6 10*3/uL (ref 1.7–7.7)
Neutrophils Relative %: 60 %
Platelets: 262 10*3/uL (ref 150–400)
RBC: 4.18 MIL/uL — ABNORMAL LOW (ref 4.22–5.81)
RDW: 13 % (ref 11.5–15.5)
WBC: 6 10*3/uL (ref 4.0–10.5)
nRBC: 0 % (ref 0.0–0.2)

## 2020-05-02 LAB — COMPREHENSIVE METABOLIC PANEL
ALT: 20 U/L (ref 0–44)
AST: 33 U/L (ref 15–41)
Albumin: 3.1 g/dL — ABNORMAL LOW (ref 3.5–5.0)
Alkaline Phosphatase: 59 U/L (ref 38–126)
Anion gap: 8 (ref 5–15)
BUN: 56 mg/dL — ABNORMAL HIGH (ref 8–23)
CO2: 25 mmol/L (ref 22–32)
Calcium: 8.7 mg/dL — ABNORMAL LOW (ref 8.9–10.3)
Chloride: 105 mmol/L (ref 98–111)
Creatinine, Ser: 2.61 mg/dL — ABNORMAL HIGH (ref 0.61–1.24)
GFR calc Af Amer: 27 mL/min — ABNORMAL LOW (ref >60)
GFR calc non Af Amer: 24 mL/min — ABNORMAL LOW (ref >60)
Glucose, Bld: 100 mg/dL — ABNORMAL HIGH (ref 70–99)
Potassium: 4.4 mmol/L (ref 3.5–5.1)
Sodium: 138 mmol/L (ref 135–145)
Total Bilirubin: 0.5 mg/dL (ref 0.3–1.2)
Total Protein: 5.9 g/dL — ABNORMAL LOW (ref 6.5–8.1)

## 2020-05-02 LAB — BRAIN NATRIURETIC PEPTIDE: B Natriuretic Peptide: 740.3 pg/mL — ABNORMAL HIGH (ref 0.0–100.0)

## 2020-05-02 LAB — SARS CORONAVIRUS 2 BY RT PCR (HOSPITAL ORDER, PERFORMED IN ~~LOC~~ HOSPITAL LAB): SARS Coronavirus 2: NEGATIVE

## 2020-05-02 LAB — URINALYSIS, ROUTINE W REFLEX MICROSCOPIC
Bilirubin Urine: NEGATIVE
Glucose, UA: NEGATIVE mg/dL
Hgb urine dipstick: NEGATIVE
Ketones, ur: NEGATIVE mg/dL
Nitrite: NEGATIVE
Protein, ur: 100 mg/dL — AB
Specific Gravity, Urine: 1.013 (ref 1.005–1.030)
pH: 7 (ref 5.0–8.0)

## 2020-05-02 LAB — MAGNESIUM: Magnesium: 2.4 mg/dL (ref 1.7–2.4)

## 2020-05-02 LAB — RAPID URINE DRUG SCREEN, HOSP PERFORMED
Amphetamines: NOT DETECTED
Barbiturates: NOT DETECTED
Benzodiazepines: NOT DETECTED
Cocaine: POSITIVE — AB
Opiates: NOT DETECTED
Tetrahydrocannabinol: NOT DETECTED

## 2020-05-02 LAB — LACTIC ACID, PLASMA: Lactic Acid, Venous: 1.4 mmol/L (ref 0.5–1.9)

## 2020-05-02 LAB — AMMONIA: Ammonia: 32 umol/L (ref 9–35)

## 2020-05-02 LAB — CBG MONITORING, ED: Glucose-Capillary: 89 mg/dL (ref 70–99)

## 2020-05-02 LAB — TROPONIN I (HIGH SENSITIVITY)
Troponin I (High Sensitivity): 21 ng/L — ABNORMAL HIGH (ref <18)
Troponin I (High Sensitivity): 19 ng/L — ABNORMAL HIGH (ref <18)

## 2020-05-02 LAB — LIPASE, BLOOD: Lipase: 25 U/L (ref 11–51)

## 2020-05-02 MED ORDER — THIAMINE HCL 100 MG/ML IJ SOLN: 100.0000 mg | Freq: Every day | INTRAMUSCULAR | Status: DC

## 2020-05-02 MED ORDER — SODIUM CHLORIDE 0.9 % IV SOLN: 250.0000 mL | INTRAVENOUS | Status: DC | PRN

## 2020-05-02 MED ORDER — FOLIC ACID 1 MG PO TABS
1.0000 mg | ORAL_TABLET | Freq: Every day | ORAL | Status: DC
  Administered 2020-05-02 – 2020-05-09 (×8): 1 mg via ORAL
  Filled 2020-05-02 (×8): qty 1

## 2020-05-02 MED ORDER — LEVETIRACETAM IN NACL 1000 MG/100ML IV SOLN
1000.0000 mg | Freq: Once | INTRAVENOUS | Status: AC
  Administered 2020-05-02: 1000 mg via INTRAVENOUS
  Filled 2020-05-02: qty 100

## 2020-05-02 MED ORDER — LEVETIRACETAM IN NACL 500 MG/100ML IV SOLN
500.0000 mg | Freq: Two times a day (BID) | INTRAVENOUS | Status: DC
  Administered 2020-05-03 – 2020-05-06 (×7): 500 mg via INTRAVENOUS
  Filled 2020-05-02 (×10): qty 100

## 2020-05-02 MED ORDER — FUROSEMIDE 10 MG/ML IJ SOLN: 40.0000 mg | Freq: Once | INTRAMUSCULAR | Status: DC

## 2020-05-02 MED ORDER — SODIUM CHLORIDE 0.9% FLUSH
3.0000 mL | Freq: Two times a day (BID) | INTRAVENOUS | Status: DC
  Administered 2020-05-02 – 2020-05-08 (×8): 3 mL via INTRAVENOUS

## 2020-05-02 MED ORDER — SODIUM CHLORIDE 0.9% FLUSH: 3.0000 mL | INTRAVENOUS | Status: DC | PRN

## 2020-05-02 MED ORDER — INSULIN ASPART 100 UNIT/ML ~~LOC~~ SOLN
0.0000 [IU] | Freq: Three times a day (TID) | SUBCUTANEOUS | Status: DC
  Administered 2020-05-04: 2 [IU] via SUBCUTANEOUS
  Administered 2020-05-05 – 2020-05-07 (×3): 1 [IU] via SUBCUTANEOUS

## 2020-05-02 MED ORDER — ACETAMINOPHEN 325 MG PO TABS
650.0000 mg | ORAL_TABLET | ORAL | Status: DC | PRN
  Administered 2020-05-05: 650 mg via ORAL
  Filled 2020-05-02: qty 2

## 2020-05-02 MED ORDER — FUROSEMIDE 10 MG/ML IJ SOLN
40.0000 mg | Freq: Once | INTRAMUSCULAR | Status: AC
  Administered 2020-05-02: 40 mg via INTRAVENOUS
  Filled 2020-05-02: qty 4

## 2020-05-02 MED ORDER — FUROSEMIDE 10 MG/ML IJ SOLN
40.0000 mg | Freq: Two times a day (BID) | INTRAMUSCULAR | Status: DC
  Administered 2020-05-03 – 2020-05-04 (×3): 40 mg via INTRAVENOUS
  Filled 2020-05-02 (×3): qty 4

## 2020-05-02 MED ORDER — THIAMINE HCL 100 MG PO TABS
100.0000 mg | ORAL_TABLET | Freq: Every day | ORAL | Status: DC
  Administered 2020-05-02 – 2020-05-09 (×8): 100 mg via ORAL
  Filled 2020-05-02 (×8): qty 1

## 2020-05-02 MED ORDER — ADULT MULTIVITAMIN W/MINERALS CH
1.0000 | ORAL_TABLET | Freq: Every day | ORAL | Status: DC
  Administered 2020-05-02 – 2020-05-09 (×8): 1 via ORAL
  Filled 2020-05-02 (×8): qty 1

## 2020-05-02 MED ORDER — INSULIN ASPART 100 UNIT/ML ~~LOC~~ SOLN: 0.0000 [IU] | Freq: Every day | SUBCUTANEOUS | Status: DC

## 2020-05-02 MED ORDER — IOHEXOL 300 MG/ML  SOLN: 100.0000 mL | Freq: Once | INTRAMUSCULAR | Status: DC | PRN

## 2020-05-02 MED ORDER — LORAZEPAM 1 MG PO TABS: 1.0000 mg | ORAL_TABLET | ORAL | Status: AC | PRN

## 2020-05-02 MED ORDER — LORAZEPAM 2 MG/ML IJ SOLN: 1.0000 mg | INTRAMUSCULAR | Status: AC | PRN

## 2020-05-02 NOTE — ED Provider Notes (Signed)
[facility] EMERGENCY DEPARTMENT Provider Note   CSN: [id number] Arrival date & time: 05/02/20  1500     History Chief Complaint  Patient presents with  . Diarrhea    Tim Bradley is a 69 y.o. male.   Diarrhea Quality:  Unable to specify Severity:  Moderate Onset quality:  Gradual Timing:  Intermittent Progression:  Unable to specify Relieved by:  Nothing Worsened by:  Nothing Ineffective treatments:  None tried Associated symptoms: no abdominal pain, no arthralgias, no chills, no fever, no headaches, no myalgias and no vomiting        Past Medical History:  Diagnosis Date  . Abscess 02/2017   LEFT GLUTEAL   . Acid reflux   . Alcohol abuse   . Anemia   . CHF (congestive heart failure) ([place])   . 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 Cocaine abuse ([place])   . DDD (degenerative disc disease), lumbar   . Diabetes mellitus without complication ([place])   . Dyspnea   . Enlarged prostate   . Hypertension   . Spinal stenosis, lumbar     Patient Active Problem List   Diagnosis Date Noted  . Pressure injury of skin 05/03/2020  . Acute encephalopathy 05/02/2020  . Diarrhea 05/02/2020  . QT prolongation 05/02/2020  . Acute combined systolic and diastolic congestive heart failure ([place]) 04/22/2020  . Essential hypertension 04/11/2020  . Leg edema 04/11/2020  . Dyspnea 04/11/2020  . Acute CHF (congestive heart failure) ([place]) 04/11/2020  . BPH (benign prostatic hyperplasia) 04/11/2020  . 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 Generalized weakness 04/11/2020  . Seizure ([place]) 07/21/2019  . Cocaine abuse ([place])   . Constipation 08/22/2018  . Gastritis 08/20/2018  . Heme positive stool   . Acute blood loss anemia secondary to massive gastric ulcer   . Hyponatremia   . Abscess, gluteal, left 03/26/2017  . Type 2 diabetes mellitus ([place]) 03/26/2017  . Adrenal nodule ([place]) 03/26/2017   . Closed fracture of body of thoracic vertebra ([place]) 03/26/2017  . Spinal stenosis of lumbar region 03/26/2017    Past Surgical History:  Procedure Laterality Date  . BACK SURGERY     cervical x2  . BIOPSY  08/21/2018   Procedure: BIOPSY;  Surgeon: Ronald Lobo, MD;  Location: [facility];  Service: Endoscopy;;  . ESOPHAGOGASTRODUODENOSCOPY (EGD) WITH PROPOFOL N/A 08/21/2018   Procedure: ESOPHAGOGASTRODUODENOSCOPY (EGD) WITH PROPOFOL;  Surgeon: Ronald Lobo, MD;  Location: [facility];  Service: Endoscopy;  Laterality: N/A;  . INCISION AND DRAINAGE PERIRECTAL ABSCESS N/A 03/26/2017   Procedure: IRRIGATION AND DEBRIDEMENT PERIRECTAL ABSCESS;  Surgeon: Rolm Bookbinder, MD;  Location: [facility];  Service: General;  Laterality: N/A;       Family History  Problem Relation Age of Onset  . Hypertension Mother   . Cancer - Lung Father     Social History   Tobacco Use  . Smoking status: Current Every Day Smoker    Packs/day: 0.25    Types: Cigarettes  . Smokeless tobacco: Never Used  Vaping Use  . Vaping Use: Never used  Substance Use Topics  . Alcohol use: Yes    Alcohol/week: 12.0 standard drinks    Types: 12 Cans of beer per week    Comment: i can of beer daily  . Drug use: Yes    Types: Cocaine, Marijuana    Comment: 03/2020     Home Medications Prior to  Admission medications   Medication Sig Start Date End Date Taking? Authorizing Provider  aspirin EC 81 MG EC tablet Take 1 tablet (81 mg total) by mouth daily. Swallow whole. 04/14/20  Yes Sheikh, Omair Latif, DO  carvedilol (COREG) 3.125 MG tablet Take 1 tablet (3.125 mg total) by mouth 2 (two) times daily with a meal. 04/13/20  Yes Sheikh, Omair Latif, DO  furosemide (LASIX) 40 MG tablet Take 1 tablet (40 mg total) by mouth daily. 04/11/20  Yes Larene Pickett, PA-C  isosorbide-hydrALAZINE (BIDIL) 20-37.5 MG tablet Take 1 tablet by mouth 3 (three) times daily. 04/13/20  Yes Sheikh, Omair Latif, DO  levETIRAcetam  (KEPPRA) 500 MG tablet Take 1 tablet (500 mg total) by mouth 2 (two) times daily. 03/19/20  Yes Corena Herter, PA-C  tamsulosin (FLOMAX) 0.4 MG CAPS capsule Take 1 capsule (0.4 mg total) by mouth daily after supper. 01/27/20  Yes Deno Etienne, DO    Allergies    Patient has no known allergies.  Review of Systems   Review of Systems  Unable to perform ROS: Acuity of condition  Constitutional: Negative for chills and fever.  HENT: Negative for congestion and rhinorrhea.   Respiratory: Negative for cough and shortness of breath.   Cardiovascular: Positive for leg swelling. Negative for chest pain and palpitations.  Gastrointestinal: Positive for diarrhea. Negative for abdominal pain, nausea and vomiting.  Genitourinary: Negative for difficulty urinating and dysuria.  Musculoskeletal: Negative for arthralgias, back pain and myalgias.  Skin: Positive for color change and wound. Negative for rash.  Neurological: Negative for light-headedness and headaches.  pt is yelling about his bottom and not answering all questions   Physical Exam Updated Vital Signs BP 138/79 (BP Location: Left Arm)   Pulse 78   Temp 98 F (36.7 C) (Oral)   Resp 20   Ht 6\' 2"  (1.88 m)   Wt 94.1 kg   SpO2 94%   BMI 26.64 kg/m   Physical Exam Vitals and nursing note reviewed. Exam conducted with a chaperone present.  Constitutional:      General: He is not in acute distress.    Appearance: Normal appearance.  HENT:     Head: Normocephalic and atraumatic.     Nose: No rhinorrhea.  Eyes:     General:        Right eye: No discharge.        Left eye: No discharge.     Conjunctiva/sclera: Conjunctivae normal.  Cardiovascular:     Rate and Rhythm: Normal rate and regular rhythm.  Pulmonary:     Effort: Pulmonary effort is normal.     Breath sounds: No stridor.  Abdominal:     General: Abdomen is flat. There is distension.     Palpations: Abdomen is soft.     Tenderness: There is no abdominal tenderness.  There is no guarding or rebound.  Genitourinary:    Comments: Scattered patches of skin breakdown and erythema about the penis scrotum and perianal area.  No fluctuant areas no significant induration no purulent drainage Musculoskeletal:        General: No deformity or signs of injury.     Right lower leg: Edema present.     Left lower leg: Edema present.     Comments: Significant pitting edema to the knee bilaterally  Skin:    General: Skin is warm and dry.  Neurological:     General: No focal deficit present.     Mental Status: He is alert.  Mental status is at baseline.     Motor: No weakness.  Psychiatric:        Mood and Affect: Mood normal.        Behavior: Behavior normal.        Thought Content: Thought content normal.     ED Results / Procedures / Treatments   Labs (all labs ordered are listed, but only abnormal results are displayed) Labs Reviewed  CBC WITH DIFFERENTIAL/PLATELET - Abnormal; Notable for the following components:      Result Value   RBC 4.18 (*)    Hemoglobin 11.9 (*)    HCT 38.2 (*)    All other components within normal limits  COMPREHENSIVE METABOLIC PANEL - Abnormal; Notable for the following components:   Glucose, Bld 100 (*)    BUN 56 (*)    Creatinine, Ser 2.61 (*)    Calcium 8.7 (*)    Total Protein 5.9 (*)    Albumin 3.1 (*)    GFR calc non Af Amer 24 (*)    GFR calc Af Amer 27 (*)    All other components within normal limits  URINALYSIS, ROUTINE W REFLEX MICROSCOPIC - Abnormal; Notable for the following components:   Protein, ur 100 (*)    Leukocytes,Ua TRACE (*)    Bacteria, UA RARE (*)    All other components within normal limits  BRAIN NATRIURETIC PEPTIDE - Abnormal; Notable for the following components:   B Natriuretic Peptide 740.3 (*)    All other components within normal limits  RAPID URINE DRUG SCREEN, HOSP PERFORMED - Abnormal; Notable for the following components:   Cocaine POSITIVE (*)    All other components within normal  limits  BASIC METABOLIC PANEL - Abnormal; Notable for the following components:   Glucose, Bld 152 (*)    BUN 54 (*)    Creatinine, Ser 2.75 (*)    Calcium 8.7 (*)    GFR calc non Af Amer 22 (*)    GFR calc Af Amer 26 (*)    All other components within normal limits  TROPONIN I (HIGH SENSITIVITY) - Abnormal; Notable for the following components:   Troponin I (High Sensitivity) 21 (*)    All other components within normal limits  TROPONIN I (HIGH SENSITIVITY) - Abnormal; Notable for the following components:   Troponin I (High Sensitivity) 19 (*)    All other components within normal limits  SARS CORONAVIRUS 2 BY RT PCR (HOSPITAL ORDER, [facility])  GASTROINTESTINAL PANEL BY PCR, STOOL (REPLACES STOOL CULTURE)  C DIFFICILE QUICK SCREEN W PCR REFLEX  URINE CULTURE  LIPASE, BLOOD  AMMONIA  LACTIC ACID, PLASMA  PHOSPHORUS  MAGNESIUM  CBG MONITORING, ED    EKG EKG Interpretation  Date/Time:  Wednesday May 02 2020 15:01:17 EDT Ventricular Rate:  82 PR Interval:    QRS Duration: 156 QT Interval:  430 QTC Calculation: 503 R Axis:   -62 Text Interpretation: Sinus rhythm Biatrial enlargement Left bundle branch block unchanged from previous Confirmed by Dewaine Conger [id number]) on 05/02/2020 3:14:18 PM   Radiology CT HEAD WO CONTRAST  Result Date: 05/02/2020 CLINICAL DATA:  Follow-up subarachnoid hemorrhage EXAM: CT HEAD WITHOUT CONTRAST TECHNIQUE: Contiguous axial images were obtained from the base of the skull through the vertex without intravenous contrast. COMPARISON:  CT 05/02/2020, 04/13/2020 FINDINGS: Brain: Stable appearance of the hyperattenuating focus along the mesial aspect of the left frontal lobe in the region of the cingulate gyrus. Corresponds well to a  similar hyperattenuating focus on the most recent and more remote comparison images likely reflecting some chronic thrombus or plaque within the left A2 segment of the anterior cerebral artery. No  new sites of hemorrhage are identified. No evidence of acute infarction, hydrocephalus, extra-axial collection or mass lesion/mass effect. Patchy areas of white matter hypoattenuation are most compatible with chronic microvascular angiopathy. Symmetric prominence of the ventricles, cisterns and sulci compatible with parenchymal volume loss. Vascular: Hyperdensity in the region of the left A2 segment remains stable. Additional calcification in the carotid siphons. Skull: No significant scalp swelling or hematoma. Benign-appearing dermal calcifications. No acute or suspicious osseous lesions. Sinuses/Orbits: Minimal mural thickening in the ethmoid air cells. Paranasal sinuses and mastoid air cells are otherwise predominantly clear. Included orbital structures are unremarkable. Other: None IMPRESSION: 1. Stable appearance of the hyperattenuating focus along the mesial aspect of the left frontal lobe in the region of the cingulate gyrus. Corresponds well to a similar hyperattenuating focus on the most recent as well as more remote comparison images likely reflecting some chronic thrombus or plaque within the left A2 segment of the anterior cerebral artery. 2. No new sites of hemorrhage are identified. 3. Stable parenchymal volume loss and chronic microvascular angiopathy. Electronically Signed   By: Lovena Le M.D.   On: 05/02/2020 23:35   CT Head Wo Contrast  Addendum Date: 05/02/2020   ADDENDUM REPORT: 05/02/2020 20:34 ADDENDUM: Case discussed with Dr. Lorraine Lax. The hyperattenuating focus along the cingulate gyrus corresponds to an A2 branch of the anterior cerebral artery and is unchanged compared to 04/13/2020. This may represent chronic thrombus or hyperdense atherosclerotic plaque within the ACA. Electronically Signed   By: Ulyses Jarred M.D.   On: 05/02/2020 20:34   Result Date: 05/02/2020 CLINICAL DATA:  Confusion, found on ground EXAM: CT HEAD WITHOUT CONTRAST TECHNIQUE: Contiguous axial images were obtained  from the base of the skull through the vertex without intravenous contrast. COMPARISON:  CT 04/13/2020 FINDINGS: Brain: Small amount of hyperdensity is present along surface of the mesial left frontal lobe, along the cingulate gyrus and sulcus of the corpus callosum. No other acute intracranial hyperdensity or hemorrhage is seen. No CT evidence of acute large territory infarct. No mass effect or midline shift. No evidence of hydrocephaly. Basal cisterns are patent. Symmetric prominence of the ventricles, cisterns and sulci compatible with parenchymal volume loss. Patchy areas of white matter hypoattenuation are most compatible with chronic microvascular angiopathy. Vascular: Atherosclerotic calcification of the carotid siphons. No hyperdense vessel. Skull: No significant scalp swelling or hematoma. No subjacent calvarial fractures within the limitations of motion artifact. No visible facial bone fractures within the margins of imaging. Sinuses/Orbits: Mild thickening in the ethmoids. Small benign osteoma present in the left ethmoids. Remaining paranasal sinuses and mastoids are predominantly clear. Middle ear cavities are clear. Included orbital structures are unremarkable. Other: None IMPRESSION: 1. Small amount of hyperdensity along the mesial surface of the left frontal lobe, along the cingulate gyrus and sulcus of the corpus callosum. Findings are concerning for a small amount of subarachnoid hemorrhage in the setting of potentially unwitnessed fall. 2. No significant scalp swelling or thickening. No acute osseous injury. 3. Background of parenchymal volume loss, chronic microvascular angiopathy and intracranial atherosclerosis. Critical Value/emergent results were called by telephone at the time of interpretation on 05/02/2020 at 4:54 pm to provider [place] , who verbally acknowledged these results. Electronically Signed: By: Lovena Le M.D. On: 05/02/2020 16:55   DG Chest Portable 1 View  Result Date:  05/02/2020 CLINICAL DATA:  69 year old male with heart failure. Diarrhea. EXAM: PORTABLE CHEST 1 VIEW COMPARISON:  Chest radiograph dated 04/22/2020 FINDINGS: There is cardiomegaly with mild central vascular congestion. No focal consolidation, pleural effusion, or pneumothorax. Atherosclerotic calcification of the aortic arch. No acute osseous pathology. IMPRESSION: Cardiomegaly with mild central vascular congestion. No focal consolidation. Electronically Signed   By: Anner [place] M.D.   On: 05/02/2020 15:44    Procedures Procedures (including critical care time)  Medications Ordered in ED Medications  levETIRAcetam (KEPPRA) IVPB 500 mg/100 mL premix (has no administration in time range)  sodium chloride flush (NS) 0.9 % injection 3 mL (3 mLs Intravenous Given 05/02/20 2214)  sodium chloride flush (NS) 0.9 % injection 3 mL (has no administration in time range)  0.9 %  sodium chloride infusion (has no administration in time range)  acetaminophen (TYLENOL) tablet 650 mg (has no administration in time range)  LORazepam (ATIVAN) tablet 1-4 mg (has no administration in time range)    Or  LORazepam (ATIVAN) injection 1-4 mg (has no administration in time range)  thiamine tablet 100 mg (100 mg Oral Given 05/02/20 2143)    Or  thiamine (B-1) injection 100 mg ( Intravenous See Alternative 04/30/43 8185)  folic acid (FOLVITE) tablet 1 mg (1 mg Oral Given 05/02/20 2143)  multivitamin with minerals tablet 1 tablet (1 tablet Oral Given 05/02/20 2143)  furosemide (LASIX) injection 40 mg (has no administration in time range)  insulin aspart (novoLOG) injection 0-9 Units (has no administration in time range)  insulin aspart (novoLOG) injection 0-5 Units (0 Units Subcutaneous Not Given 05/02/20 2214)  furosemide (LASIX) injection 40 mg (40 mg Intravenous Given 05/02/20 1628)  levETIRAcetam (KEPPRA) IVPB 1000 mg/100 mL premix (0 mg Intravenous Stopped 05/02/20 1925)    ED Course  I have reviewed the triage vital signs  and the nursing notes.  Pertinent labs & imaging results that were available during my care of the patient were reviewed by me and considered in my medical decision making (see chart for details).    MDM Rules/Calculators/A&P                          Patient comes in after EMS was called by a family friend found him on the ground in his own stool, claims have been having diarrhea.  Vital signs show mild hypotension with initial blood pressure on my assessment 105/58.  Heart rate is within normal limits he is afebrile.  Significant pitting edema.  Does not have any complaint of chest pain shortness of breath.  Chest x-ray shows cardiomegaly and mild central vascular congestion after radiology review, I reviewed these images as well no focal airway disease.  EKG shows no acute ischemic change interval abnormality or arrhythmia other than chronic changes, this EKG is similar to previous. Negative for lactic acidosis.  Pt care was handed off to on coming provider at 1600.  Complete history and physical and current plan have been communicated.  Please refer to their note for the remainder of ED care and ultimate disposition.  Pt seen in conjunction with Dr. Maryan Rued   Final Clinical Impression(s) / ED Diagnoses Final diagnoses:  Altered mental status, unspecified altered mental status type  Seizure ([place])  Acute on chronic congestive heart failure, unspecified heart failure type [facility])    Rx / DC Orders ED Discharge Orders    None       Breck Coons,  MD 05/03/20 3943

## 2020-05-02 NOTE — ED Triage Notes (Signed)
Pt comes from home. Per EMS friend found pt with swollen feet and covered in stool.upon arrival pt was in visible distress and states he is having "rear end" pain 7/10. Pt has moderate redness to perineal and coccyx area. Alert to self and place only.

## 2020-05-02 NOTE — ED Provider Notes (Signed)
Assumed care at 4:30 PM from Dr. Ron Parker.  Patient waiting on lab results.  Patient's friend shows up and reports that he is living in terrible conditions his home should be condemned and he does not have any indoor heat and only a small air conditioning unit.  He did leave the hospital AMA last week and since then has not been taking his medication.  She reports that he seemed altered when she went to see him today and then he had what looked like seizure-like activity where he shook generally and then had incontinence of stool and since that time has been very somnolent.  Patient will briefly open his eyes but is still extremely sleepy on exam.  No focal seizure activity noted.  However patient does have a history of seizure disorder and is supposed to be on Keppra though he has not taken it.  Discussed with neurology and they recommended EEG and will give a loading dose of Keppra.  Patient had already received Lasix but has ongoing signs of fluid overload.  CMP and lipase is still pending as well as ammonia.  Plan will be to admit the patient but also appears that he will need social work evaluation once admitted for safe discharge planning.  CT of the head with questionable blood however neurology has evaluated and has low suspicion that this is true bleeding.  Will have repeat CT in 6 hours.   Blanchie Dessert, MD 05/02/20 1925

## 2020-05-02 NOTE — Progress Notes (Signed)
EEG complete - results pending 

## 2020-05-02 NOTE — H&P (Signed)
History and Physical    Tim Bradley CBS:[id number] DOB: [birth year] DOA: 05/02/2020  PCP: [facility] Patient coming from: Home  Chief Complaint: Diarrhea, bilateral lower extremity edema  HPI: Tim Bradley is a 69 y.o. male with medical history significant of polysubstance abuse (alcohol, cocaine, marijuana, tobacco), anemia, chronic combined systolic and diastolic CHF (EF <84% on echo done 04/11/2020), CKD stage III-IV, diet-controlled type 2 diabetes, BPH, hypertension, seizure disorder presenting to the ED via EMS for evaluation of diarrhea and bilateral lower extremity edema. Of note, patient was admitted in July 2021 for acute decompensated CHF and left AMA on 7/29.    No family or friends available at this time to give collateral information.  Per ED physician Dr. Acquanetta Chain conversation with the patient's friend: "Patient's friend shows up and reports that he is living in terrible conditions his home should be condemned and he does not have any indoor heat and only a small air conditioning unit.  He did leave the hospital AMA last week and since then has not been taking his medication.  She reports that he seemed altered when she went to see him today and then he had what looked like seizure-like activity where he shook generally and then had incontinence of stool and since that time has been very somnolent.    Patient is currently AAO x3, however, somnolent and intermittently falling asleep during the conversation.  States he was brought to the hospital as he had a seizure at home.  He lives alone.  Reports having a seizure every month.  He has not been taking his home medications.  Reports falling from the toilet last week but does not think he hit his head during this fall.  Does state that he had another fall previously during which he might have hit his head.  Reports having a cough, shortness of breath, and bilateral lower extremity edema for a week.  Denies chest pain.  Reports  having nonbloody diarrhea for the past 3 to 4 days.  Denies nausea, vomiting, abdominal pain, or dysuria.  No additional history could be obtained from him.   ED Course: Afebrile and hemodynamically stable.  Labs showing no leukocytosis.  Lactic acid normal.  Hemoglobin 11.9, appears to be at baseline.  Creatinine 2.6, stable since recent hospitalization.  Lipase and LFTs normal.  High-sensitivity troponin 21, repeat stable at 19.  EKG without acute ischemic changes.  BNP elevated at 740.  UA with trace amount of leukocytes, 11-20 WBCs, and rare bacteria.  UDS positive for cocaine.  Ammonia level normal.  SARS-CoV-2 PCR test negative.  Chest x-ray showing cardiomegaly with mild central vascular congestion.  Head CT: "IMPRESSION: 1. Small amount of hyperdensity along the mesial surface of the left frontal lobe, along the cingulate gyrus and sulcus of the corpus callosum. Findings are concerning for a small amount of subarachnoid hemorrhage in the setting of potentially unwitnessed fall. 2. No significant scalp swelling or thickening. No acute osseous injury. 3. Background of parenchymal volume loss, chronic microvascular angiopathy and intracranial atherosclerosis."  ED provider discussed the case with Dr. Leonel Ramsay from neurology who recommended obtaining an EEG and giving a loading dose of Keppra.  Keppra 1000 mg was administered in the ED.  Patient's head CT was reviewed by Dr. Leonel Ramsay and he had low suspicion that this was true bleeding.  Recommended repeat head CT in 6 hours.  Patient was also given IV Lasix 40 mg.  Review of Systems:  All systems reviewed  and apart from history of presenting illness, are negative.  Past Medical History:  Diagnosis Date  . Abscess 02/2017   LEFT GLUTEAL   . Acid reflux   . Alcohol abuse   . Anemia   . CHF (congestive heart failure) ([place])   . 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 Cocaine abuse ([place])   .  DDD (degenerative disc disease), lumbar   . Diabetes mellitus without complication ([place])   . Dyspnea   . Enlarged prostate   . Hypertension   . Spinal stenosis, lumbar     Past Surgical History:  Procedure Laterality Date  . BACK SURGERY     cervical x2  . BIOPSY  08/21/2018   Procedure: BIOPSY;  Surgeon: Ronald Lobo, MD;  Location: [facility];  Service: Endoscopy;;  . ESOPHAGOGASTRODUODENOSCOPY (EGD) WITH PROPOFOL N/A 08/21/2018   Procedure: ESOPHAGOGASTRODUODENOSCOPY (EGD) WITH PROPOFOL;  Surgeon: Ronald Lobo, MD;  Location: [facility];  Service: Endoscopy;  Laterality: N/A;  . INCISION AND DRAINAGE PERIRECTAL ABSCESS N/A 03/26/2017   Procedure: IRRIGATION AND DEBRIDEMENT PERIRECTAL ABSCESS;  Surgeon: Rolm Bookbinder, MD;  Location: [facility];  Service: General;  Laterality: N/A;     reports that he has been smoking cigarettes. He has been smoking about 0.25 packs per day. He has never used smokeless tobacco. He reports current alcohol use of about 12.0 standard drinks of alcohol per week. He reports current drug use. Drugs: Cocaine and Marijuana.  No Known Allergies  Family History  Problem Relation Age of Onset  . Hypertension Mother   . Cancer - Lung Father     Prior to Admission medications   Medication Sig Start Date End Date Taking? Authorizing Provider  aspirin EC 81 MG EC tablet Take 1 tablet (81 mg total) by mouth daily. Swallow whole. 04/14/20   Raiford Noble Latif, DO  carvedilol (COREG) 3.125 MG tablet Take 1 tablet (3.125 mg total) by mouth 2 (two) times daily with a meal. 04/13/20   Sheikh, Omair Latif, DO  furosemide (LASIX) 40 MG tablet Take 1 tablet (40 mg total) by mouth daily. 04/11/20   Larene Pickett, PA-C  isosorbide-hydrALAZINE (BIDIL) 20-37.5 MG tablet Take 1 tablet by mouth 3 (three) times daily. 04/13/20   Raiford Noble Latif, DO  levETIRAcetam (KEPPRA) 500 MG tablet Take 1 tablet (500 mg total) by mouth 2 (two) times daily. 03/19/20   Corena Herter, PA-C  tamsulosin (FLOMAX) 0.4 MG CAPS capsule Take 1 capsule (0.4 mg total) by mouth daily after supper. 01/27/20   Deno Etienne, DO    Physical Exam: Vitals:   05/02/20 1501 05/02/20 1506 05/02/20 1514  BP: 123/72    Pulse: 88 80 80  Resp:  19 19  Temp:   98.3 F (36.8 C)  SpO2: 98% 95% 96%    Physical Exam Constitutional:      General: He is not in acute distress.    Appearance: He is not diaphoretic.  HENT:     Head: Normocephalic.  Eyes:     Extraocular Movements: Extraocular movements intact.     Pupils: Pupils are equal, round, and reactive to light.  Cardiovascular:     Rate and Rhythm: Normal rate and regular rhythm.     Pulses: Normal pulses.  Pulmonary:     Effort: Pulmonary effort is normal. No respiratory distress.     Breath sounds: No wheezing or rales.  Abdominal:     General: Bowel sounds are normal.  There is no distension.     Palpations: Abdomen is soft.     Tenderness: There is no abdominal tenderness. There is no guarding or rebound.  Musculoskeletal:     Cervical back: Normal range of motion and neck supple.     Right lower leg: Edema present.     Left lower leg: Edema present.     Comments: 3+ pitting edema bilateral lower extremities  Skin:    General: Skin is warm and dry.  Neurological:     Comments: AAOx3 Somnolent No focal motor or sensory deficit.     Labs on Admission: I have personally reviewed following labs and imaging studies  CBC: Recent Labs  Lab 05/02/20 1528  WBC 6.0  NEUTROABS 3.6  HGB 11.9*  HCT 38.2*  MCV 91.4  PLT 062   Basic Metabolic Panel: Recent Labs  Lab 04/26/20 0426 04/26/20 1112 05/02/20 1528  NA  --  139 138  K  --  3.9 4.4  CL  --  103 105  CO2  --  28 25  GLUCOSE  --  95 100*  BUN  --  50* 56*  CREATININE  --  2.50* 2.61*  CALCIUM  --  8.8* 8.7*  MG 2.3  --   --    GFR: Estimated Creatinine Clearance: 30.2 mL/min (A) (by C-G formula based on SCr of 2.61 mg/dL (H)). Liver Function  Tests: Recent Labs  Lab 05/02/20 1528  AST 33  ALT 20  ALKPHOS 59  BILITOT 0.5  PROT 5.9*  ALBUMIN 3.1*   Recent Labs  Lab 05/02/20 1528  LIPASE 25   Recent Labs  Lab 05/02/20 1855  AMMONIA 32   Coagulation Profile: No results for input(s): INR, PROTIME in the last 168 hours. Cardiac Enzymes: No results for input(s): CKTOTAL, CKMB, CKMBINDEX, TROPONINI in the last 168 hours. BNP (last 3 results) No results for input(s): PROBNP in the last 8760 hours. HbA1C: No results for input(s): HGBA1C in the last 72 hours. CBG: No results for input(s): GLUCAP in the last 168 hours. Lipid Profile: No results for input(s): CHOL, HDL, LDLCALC, TRIG, CHOLHDL, LDLDIRECT in the last 72 hours. Thyroid Function Tests: No results for input(s): TSH, T4TOTAL, FREET4, T3FREE, THYROIDAB in the last 72 hours. Anemia Panel: No results for input(s): VITAMINB12, FOLATE, FERRITIN, TIBC, IRON, RETICCTPCT in the last 72 hours. Urine analysis:    Component Value Date/Time   COLORURINE YELLOW 05/02/2020 1618   APPEARANCEUR CLEAR 05/02/2020 1618   LABSPEC 1.013 05/02/2020 1618   PHURINE 7.0 05/02/2020 1618   GLUCOSEU NEGATIVE 05/02/2020 1618   HGBUR NEGATIVE 05/02/2020 1618   BILIRUBINUR NEGATIVE 05/02/2020 1618   KETONESUR NEGATIVE 05/02/2020 1618   PROTEINUR 100 (A) 05/02/2020 1618   UROBILINOGEN 0.2 03/11/2012 2012   NITRITE NEGATIVE 05/02/2020 1618   LEUKOCYTESUR TRACE (A) 05/02/2020 1618    Radiological Exams on Admission: CT Head Wo Contrast  Addendum Date: 05/02/2020   ADDENDUM REPORT: 05/02/2020 20:34 ADDENDUM: Case discussed with Dr. Lorraine Lax. The hyperattenuating focus along the cingulate gyrus corresponds to an A2 branch of the anterior cerebral artery and is unchanged compared to 04/13/2020. This may represent chronic thrombus or hyperdense atherosclerotic plaque within the ACA. Electronically Signed   By: Ulyses Jarred M.D.   On: 05/02/2020 20:34   Result Date: 05/02/2020 CLINICAL  DATA:  Confusion, found on ground EXAM: CT HEAD WITHOUT CONTRAST TECHNIQUE: Contiguous axial images were obtained from the base of the skull through the vertex without intravenous contrast.  COMPARISON:  CT 04/13/2020 FINDINGS: Brain: Small amount of hyperdensity is present along surface of the mesial left frontal lobe, along the cingulate gyrus and sulcus of the corpus callosum. No other acute intracranial hyperdensity or hemorrhage is seen. No CT evidence of acute large territory infarct. No mass effect or midline shift. No evidence of hydrocephaly. Basal cisterns are patent. Symmetric prominence of the ventricles, cisterns and sulci compatible with parenchymal volume loss. Patchy areas of white matter hypoattenuation are most compatible with chronic microvascular angiopathy. Vascular: Atherosclerotic calcification of the carotid siphons. No hyperdense vessel. Skull: No significant scalp swelling or hematoma. No subjacent calvarial fractures within the limitations of motion artifact. No visible facial bone fractures within the margins of imaging. Sinuses/Orbits: Mild thickening in the ethmoids. Small benign osteoma present in the left ethmoids. Remaining paranasal sinuses and mastoids are predominantly clear. Middle ear cavities are clear. Included orbital structures are unremarkable. Other: None IMPRESSION: 1. Small amount of hyperdensity along the mesial surface of the left frontal lobe, along the cingulate gyrus and sulcus of the corpus callosum. Findings are concerning for a small amount of subarachnoid hemorrhage in the setting of potentially unwitnessed fall. 2. No significant scalp swelling or thickening. No acute osseous injury. 3. Background of parenchymal volume loss, chronic microvascular angiopathy and intracranial atherosclerosis. Critical Value/emergent results were called by telephone at the time of interpretation on 05/02/2020 at 4:54 pm to provider [place] , who verbally acknowledged these results.  Electronically Signed: By: Lovena Le M.D. On: 05/02/2020 16:55   DG Chest Portable 1 View  Result Date: 05/02/2020 CLINICAL DATA:  69 year old male with heart failure. Diarrhea. EXAM: PORTABLE CHEST 1 VIEW COMPARISON:  Chest radiograph dated 04/22/2020 FINDINGS: There is cardiomegaly with mild central vascular congestion. No focal consolidation, pleural effusion, or pneumothorax. Atherosclerotic calcification of the aortic arch. No acute osseous pathology. IMPRESSION: Cardiomegaly with mild central vascular congestion. No focal consolidation. Electronically Signed   By: Anner Crete M.D.   On: 05/02/2020 15:44    EKG: Independently reviewed.  Sinus rhythm, LAFB, LBBB, QTC 503.  No significant change from prior tracing.  Assessment/Plan Principal Problem:   Acute CHF (congestive heart failure) (HCC) Active Problems:   Generalized weakness   Acute encephalopathy   Diarrhea   QT prolongation  Acute exacerbation of chronic combined systolic and diastolic CHF: Echo done 5/62/1308 with LVEF <20% and grade 1 diastolic dysfunction.  Patient appears volume overloaded on exam with significant peripheral edema.  Not tachypneic or hypoxic.  BNP elevated at 740. Chest x-ray showing cardiomegaly with mild central vascular congestion. -Cardiac.  Patient received IV Lasix 40 mg in the ED.  Continue diuresis with IV Lasix 40 mg twice daily starting in the morning. Resume home Coreg and BiDil after pharmacy med rec is done.  Monitor intake and output, daily weights, and low-sodium diet with fluid restriction.  I have sent an inbox message to [facility] requesting from advanced heart failure team in the morning.  Diarrhea: No fever, leukocytosis, or signs of sepsis.  Lactic acid normal.  Abdominal exam benign. -C. difficile PCR and GI pathogen panel.  Follow enteric precautions.  Acute encephalopathy/possible seizure: Patient is currently AAOx3 but somnolent.  Ammonia level normal.  Head CT with findings that  radiologist felt were due to a small amount of subarachnoid hemorrhage.  CT has been reviewed by neurology and intracranial bleed is felt to be less likely.  Patient does report history of falls.  Aspirin is listed on home medication list  but he does report noncompliance. -Frequent neurochecks.  Repeat head CT in 6 hours.  EEG done in the ED, results pending. Patient has received IV Keppra load.  Continue home Keppra 500 mg twice daily.  Poor living conditions -Social work consult for placement  Generalized weakness -PT and OT evaluation  QT prolongation on EKG -Cardiac monitoring.  Keep potassium above 4 and magnesium above 2.  Avoid QT prolonging drugs if possible.  Repeat EKG in a.m.  ?UTI: UA with trace amount of leukocytes, 11-20 WBCs, and rare bacteria. No fever, leukocytosis, or signs of sepsis.  Patient is not endorsing any UTI symptoms. -Urine culture ordered  Alcohol use disorder: Currently not displaying any signs of withdrawal. -CIWA protocol; Ativan as needed.  Thiamine, folate, and multivitamin.  Check mag and Phos levels.  CKD stage III-IV: Creatinine 2.6, stable since recent hospitalization. -Continue monitor renal function closely  Substance abuse: UDS positive for cocaine. -Social work consult and counseling  Hypertension: Currently normotensive. -Resume home Coreg and BiDil after pharmacy med rec is done  Diet controlled type 2 diabetes: A1c 5.9 on 04/22/2020. -Sliding scale insulin sensitive ACHS and CBG checks.  BPH -Resume Flomax after pharmacy med rec is done  Seizure disorder -Keppra as above  DVT prophylaxis: SCDs at this time given concern for possible intracranial bleed, repeat head CT pending Code Status: Full code Family Communication: No family available at this time. Disposition Plan: Status is: Inpatient  Remains inpatient appropriate because:Altered mental status, Unsafe d/c plan, IV treatments appropriate due to intensity of illness or  inability to take PO and Inpatient level of care appropriate due to severity of illness   Dispo: The patient is from: Home              Anticipated d/c is to: SNF              Anticipated d/c date is: > 3 days              Patient currently is not medically stable to d/c.  The medical decision making on this patient was of high complexity and the patient is at high risk for clinical deterioration, therefore this is a level 3 visit.  Shela Leff MD [facility]  If 7PM-7AM, please contact night-coverage [email or web address]  05/02/2020, 9:01 PM

## 2020-05-02 NOTE — ED Notes (Signed)
Pt transported to CT ?

## 2020-05-02 NOTE — Consult Note (Signed)
Requesting Physician: Dr. Ron Parker    Chief Complaint: AMS  History obtained from: Patient and Chart    HPI:                                                                                                                                       Tim Bradley is a 69 y.o. male with past medical history significant for polysubstance abuse, combined systolic and diastolic heart failure, hypertension, type 2 diabetes mellitus, CKD with seizure disorder presents to emergency department with seizure-like activity and altered mental status.  No family present at bedside, prior notes patient's friend went to see her today and witnessed generalized seizure followed by stool incontinence and noted to be very somnolent.   Patient alert and oriented x3 with surrounding the emergency department.  Work-up included CT head which is unremarkable for acute findings, although there is mention of small amount of hyperdensity in left frontal lobe cingulate gyrus corpus callosum concerning for subarachnoid hemorrhage-when reviewing old CT this is also present.  UA showed trace amount of leukoesterase and bacteria, UDS positive for cocaine.  Patient was loaded with 1 g of Keppra.    Past Medical History:  Diagnosis Date  . Abscess 02/2017   LEFT GLUTEAL   . Acid reflux   . Alcohol abuse   . Anemia   . CHF (congestive heart failure) ([place])   . 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 Cocaine abuse ([place])   . DDD (degenerative disc disease), lumbar   . Diabetes mellitus without complication ([place])   . Dyspnea   . Enlarged prostate   . Hypertension   . Spinal stenosis, lumbar     Past Surgical History:  Procedure Laterality Date  . BACK SURGERY     cervical x2  . BIOPSY  08/21/2018   Procedure: BIOPSY;  Surgeon: Ronald Lobo, MD;  Location: [facility];  Service: Endoscopy;;  . ESOPHAGOGASTRODUODENOSCOPY (EGD) WITH PROPOFOL N/A 08/21/2018   Procedure:  ESOPHAGOGASTRODUODENOSCOPY (EGD) WITH PROPOFOL;  Surgeon: Ronald Lobo, MD;  Location: [facility];  Service: Endoscopy;  Laterality: N/A;  . INCISION AND DRAINAGE PERIRECTAL ABSCESS N/A 03/26/2017   Procedure: IRRIGATION AND DEBRIDEMENT PERIRECTAL ABSCESS;  Surgeon: Rolm Bookbinder, MD;  Location: [facility];  Service: General;  Laterality: N/A;    Family History  Problem Relation Age of Onset  . Hypertension Mother   . Cancer - Lung Father    Social History:  reports that he has been smoking cigarettes. He has been smoking about 0.25 packs per day. He has never used smokeless tobacco. He reports current alcohol use of about 12.0 standard drinks of alcohol per week. He reports current drug use. Drugs: Cocaine and Marijuana.  Allergies: No Known Allergies  Medications:  I reviewed home medications   ROS:                                                                                                                                     14 systems reviewed and negative except above    Examination:                                                                                                      General: Appears well-developed and well-nourished.  Psych: Affect appropriate to situation Eyes: No scleral injection HENT: No OP obstrucion Head: Normocephalic.  Cardiovascular: Normal rate and regular rhythm.  Respiratory: Effort normal and breath sounds normal to anterior ascultation GI: Soft.  No distension. There is no tenderness.  Skin: WDI    Neurological Examination Mental Status: Somnolent, oriented, thought content appropriate.  Speech fluent without evidence of aphasia. Able to follow 3 step commands without difficulty. Cranial Nerves: II: Visual fields grossly normal,  III,IV, VI: ptosis not present, extra-ocular motions intact bilaterally, pupils equal,  round, reactive to light and accommodation V,VII: smile symmetric, facial light touch sensation normal bilaterally VIII: hearing normal bilaterally IX,X: uvula rises symmetrically XI: bilateral shoulder shrug XII: midline tongue extension Motor: Right : Upper extremity   5/5    Left:     Upper extremity   5/5  Lower extremity   5/5     Lower extremity   5/5 Tone and bulk:normal tone throughout; no atrophy noted Sensory: Pinprick and light touch intact throughout, bilaterally Plantars: Right: downgoing   Left: downgoing Cerebellar: normal finger-to-nose     Lab Results: Basic Metabolic Panel: Recent Labs  Lab 04/26/20 0426 04/26/20 1112 05/02/20 1528 05/02/20 2025  NA  --  139 138  --   K  --  3.9 4.4  --   CL  --  103 105  --   CO2  --  28 25  --   GLUCOSE  --  95 100*  --   BUN  --  50* 56*  --   CREATININE  --  2.50* 2.61*  --   CALCIUM  --  8.8* 8.7*  --   MG 2.3  --   --  2.4    CBC: Recent Labs  Lab 05/02/20 1528  WBC 6.0  NEUTROABS 3.6  HGB 11.9*  HCT 38.2*  MCV 91.4  PLT 262    Coagulation Studies: No results for input(s): LABPROT, INR in the last 72 hours.  Imaging: CT  Head Wo Contrast  Addendum Date: 05/02/2020   ADDENDUM REPORT: 05/02/2020 20:34 ADDENDUM: Case discussed with Dr. Lorraine Lax. The hyperattenuating focus along the cingulate gyrus corresponds to an A2 branch of the anterior cerebral artery and is unchanged compared to 04/13/2020. This may represent chronic thrombus or hyperdense atherosclerotic plaque within the ACA. Electronically Signed   By: Ulyses Jarred M.D.   On: 05/02/2020 20:34   Result Date: 05/02/2020 CLINICAL DATA:  Confusion, found on ground EXAM: CT HEAD WITHOUT CONTRAST TECHNIQUE: Contiguous axial images were obtained from the base of the skull through the vertex without intravenous contrast. COMPARISON:  CT 04/13/2020 FINDINGS: Brain: Small amount of hyperdensity is present along surface of the mesial left frontal lobe, along the  cingulate gyrus and sulcus of the corpus callosum. No other acute intracranial hyperdensity or hemorrhage is seen. No CT evidence of acute large territory infarct. No mass effect or midline shift. No evidence of hydrocephaly. Basal cisterns are patent. Symmetric prominence of the ventricles, cisterns and sulci compatible with parenchymal volume loss. Patchy areas of white matter hypoattenuation are most compatible with chronic microvascular angiopathy. Vascular: Atherosclerotic calcification of the carotid siphons. No hyperdense vessel. Skull: No significant scalp swelling or hematoma. No subjacent calvarial fractures within the limitations of motion artifact. No visible facial bone fractures within the margins of imaging. Sinuses/Orbits: Mild thickening in the ethmoids. Small benign osteoma present in the left ethmoids. Remaining paranasal sinuses and mastoids are predominantly clear. Middle ear cavities are clear. Included orbital structures are unremarkable. Other: None IMPRESSION: 1. Small amount of hyperdensity along the mesial surface of the left frontal lobe, along the cingulate gyrus and sulcus of the corpus callosum. Findings are concerning for a small amount of subarachnoid hemorrhage in the setting of potentially unwitnessed fall. 2. No significant scalp swelling or thickening. No acute osseous injury. 3. Background of parenchymal volume loss, chronic microvascular angiopathy and intracranial atherosclerosis. Critical Value/emergent results were called by telephone at the time of interpretation on 05/02/2020 at 4:54 pm to provider [place] , who verbally acknowledged these results. Electronically Signed: By: Lovena Le M.D. On: 05/02/2020 16:55   DG Chest Portable 1 View  Result Date: 05/02/2020 CLINICAL DATA:  69 year old male with heart failure. Diarrhea. EXAM: PORTABLE CHEST 1 VIEW COMPARISON:  Chest radiograph dated 04/22/2020 FINDINGS: There is cardiomegaly with mild central vascular congestion.  No focal consolidation, pleural effusion, or pneumothorax. Atherosclerotic calcification of the aortic arch. No acute osseous pathology. IMPRESSION: Cardiomegaly with mild central vascular congestion. No focal consolidation. Electronically Signed   By: Anner Crete M.D.   On: 05/02/2020 15:44     I have reviewed the above imaging : CT head   ASSESSMENT AND PLAN   69 year old male with a history of polysubstance abuse, CHF, hypertension, diabetes and seizure disorder presents to the emergency department after witnessed seizure-like activity.  Also noted to be in acute exacerbation of his combined chronic systolic and diastolic heart failure.  CT head was repeated-stable appearance of hyperattenuating focus.  This is not subarachnoid hemorrhage likely old thrombus or atherosclerotic plaque in the left A2 cerebral artery (discussed with neuroradiology).  Acute encephalopathy Seizure-like activity Polysubstance abuse  Recommendations EEG, completed yesterday read pending MRI brain without contrast Frequent neurochecks and Seizure precautions Continue Keppra 500 mg twice daily CIWA and thiamine   Nyela Cortinas [facility] Pager Number [phone]

## 2020-05-02 NOTE — ED Notes (Signed)
Help get patient cleaned up into a gown on the monitor did ekg shown to Dr Ron Parker patient is resting with call bell in reach

## 2020-05-03 ENCOUNTER — Inpatient Hospital Stay (HOSPITAL_COMMUNITY): Payer: No Typology Code available for payment source

## 2020-05-03 DIAGNOSIS — R4182 Altered mental status, unspecified: Secondary | ICD-10-CM

## 2020-05-03 DIAGNOSIS — I5022 Chronic systolic (congestive) heart failure: Secondary | ICD-10-CM

## 2020-05-03 DIAGNOSIS — R569 Unspecified convulsions: Secondary | ICD-10-CM

## 2020-05-03 DIAGNOSIS — I10 Essential (primary) hypertension: Secondary | ICD-10-CM

## 2020-05-03 DIAGNOSIS — I5041 Acute combined systolic (congestive) and diastolic (congestive) heart failure: Secondary | ICD-10-CM

## 2020-05-03 DIAGNOSIS — L8990 Pressure ulcer of unspecified site, unspecified stage: Secondary | ICD-10-CM | POA: Insufficient documentation

## 2020-05-03 LAB — GLUCOSE, CAPILLARY
Glucose-Capillary: 136 mg/dL — ABNORMAL HIGH (ref 70–99)
Glucose-Capillary: 133 mg/dL — ABNORMAL HIGH (ref 70–99)
Glucose-Capillary: 127 mg/dL — ABNORMAL HIGH (ref 70–99)
Glucose-Capillary: 118 mg/dL — ABNORMAL HIGH (ref 70–99)

## 2020-05-03 LAB — BASIC METABOLIC PANEL
Anion gap: 11 (ref 5–15)
BUN: 54 mg/dL — ABNORMAL HIGH (ref 8–23)
CO2: 27 mmol/L (ref 22–32)
Calcium: 8.7 mg/dL — ABNORMAL LOW (ref 8.9–10.3)
Chloride: 102 mmol/L (ref 98–111)
Creatinine, Ser: 2.75 mg/dL — ABNORMAL HIGH (ref 0.61–1.24)
GFR calc Af Amer: 26 mL/min — ABNORMAL LOW (ref >60)
GFR calc non Af Amer: 22 mL/min — ABNORMAL LOW (ref >60)
Glucose, Bld: 152 mg/dL — ABNORMAL HIGH (ref 70–99)
Potassium: 3.9 mmol/L (ref 3.5–5.1)
Sodium: 140 mmol/L (ref 135–145)

## 2020-05-03 LAB — PHOSPHORUS: Phosphorus: 4.2 mg/dL (ref 2.5–4.6)

## 2020-05-03 MED ORDER — LORAZEPAM 2 MG/ML IJ SOLN
1.0000 mg | Freq: Once | INTRAMUSCULAR | Status: AC
  Administered 2020-05-03: 1 mg via INTRAVENOUS
  Filled 2020-05-03: qty 1

## 2020-05-03 MED ORDER — ISOSORB DINITRATE-HYDRALAZINE 20-37.5 MG PO TABS
1.0000 | ORAL_TABLET | Freq: Three times a day (TID) | ORAL | Status: DC
  Administered 2020-05-03 – 2020-05-09 (×18): 1 via ORAL
  Filled 2020-05-03 (×18): qty 1

## 2020-05-03 MED ORDER — ASPIRIN EC 81 MG PO TBEC
81.0000 mg | DELAYED_RELEASE_TABLET | Freq: Every day | ORAL | Status: DC
  Administered 2020-05-03 – 2020-05-09 (×7): 81 mg via ORAL
  Filled 2020-05-03 (×7): qty 1

## 2020-05-03 MED ORDER — CARVEDILOL 3.125 MG PO TABS
3.1250 mg | ORAL_TABLET | Freq: Two times a day (BID) | ORAL | Status: DC
  Administered 2020-05-03 – 2020-05-09 (×12): 3.125 mg via ORAL
  Filled 2020-05-03 (×12): qty 1

## 2020-05-03 MED ORDER — TAMSULOSIN HCL 0.4 MG PO CAPS
0.4000 mg | ORAL_CAPSULE | Freq: Every day | ORAL | Status: DC
  Administered 2020-05-03 – 2020-05-08 (×6): 0.4 mg via ORAL
  Filled 2020-05-03 (×6): qty 1

## 2020-05-03 NOTE — TOC Initial Note (Addendum)
Transition of Care [facility]) - Initial/Assessment Note    Patient Details  Name: Tim Bradley MRN: [id number] Date of Birth: 13-Nov-1950  Transition of Care [facility]) CM/SW Contact:    Emeterio Reeve, [place] Phone Number: 05/03/2020, 1:12 PM  Clinical Narrative:                  CSW met with pt at bedside. CSW introduced self and explained her role at the hospital.  Pts friend sue was present for conversation. Pt stated he was recently at the hospital and SNF was reccommended. Pt stated he chose to go home with [facility] services. Pt states that now he cant take care of himself and is barely able to move his legs. Pts friend Collie Siad states that pt lives in a old home that needs a lot of repairs and it may not be the best place for him to be.   CSW reviewed PT/OT reccs with pt. PT/OT recommend SNF. PT states he is apart of the [facility] and would like to go to a [place] snf.   CSW called April twice and [phone] and left a message to return call. CSW called pts sister [name] and did not receive a call back.   Expected Discharge Plan: Skilled Nursing Facility Barriers to Discharge: Active Substance Use - Placement, Continued Medical Work up   Patient Goals and CMS Choice Patient states their goals for this hospitalization and ongoing recovery are:: To return home CMS Medicare.gov Compare Post Acute Care list provided to:: Patient Choice offered to / list presented to : Patient, Sibling  Expected Discharge Plan and Services Expected Discharge Plan: [place] arrangements for the past 2 months: Single Family Home                                      Prior Living Arrangements/Services Living arrangements for the past 2 months: Single Family Home Lives with:: Self, Significant Other Patient language and need for interpreter reviewed:: Yes Do you feel safe going back to the place where you live?: Yes      Need for Family Participation in Patient Care: Yes  (Comment) Care giver support system in place?: No (comment)   Criminal Activity/Legal Involvement Pertinent to Current Situation/Hospitalization: No - Comment as needed  Activities of Daily Living      Permission Sought/Granted Permission sought to share information with : Facility Sport and exercise psychologist, Family Supports Permission granted to share information with : Yes, Verbal Permission Granted  Share Information with NAME: [name]  Permission granted to share info w AGENCY: [facility]  Permission granted to share info w Relationship: Sister  Permission granted to share info w Contact Information: [phone]  Emotional Assessment Appearance:: Appears stated age Attitude/Demeanor/Rapport: Engaged Affect (typically observed): Appropriate Orientation: : Oriented to Self, Oriented to Situation, Oriented to Place, Oriented to  Time Alcohol / Substance Use: Illicit Drugs Psych Involvement: No (comment)  Admission diagnosis:  Seizure ([place]) [R56.9] Altered mental status, unspecified altered mental status type [Y10.17] Acute metabolic encephalopathy [P10.25] Acute on chronic congestive heart failure, unspecified heart failure type [facility]) [I50.9] Patient Active Problem List   Diagnosis Date Noted  . Pressure injury of skin 05/03/2020  . Acute encephalopathy 05/02/2020  . Diarrhea 05/02/2020  . QT prolongation 05/02/2020  . Acute combined systolic and diastolic congestive heart failure ([place]) 04/22/2020  . Essential hypertension  04/11/2020  . Leg edema 04/11/2020  . Dyspnea 04/11/2020  . Acute CHF (congestive heart failure) ([place]) 04/11/2020  . BPH (benign prostatic hyperplasia) 04/11/2020  . 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 Generalized weakness 04/11/2020  . Seizure ([place]) 07/21/2019  . Cocaine abuse ([place])   . Constipation 08/22/2018  . Gastritis 08/20/2018  . Heme positive stool   . Acute blood loss anemia secondary to massive gastric ulcer    . Hyponatremia   . Abscess, gluteal, left 03/26/2017  . Type 2 diabetes mellitus ([place]) 03/26/2017  . Adrenal nodule ([place]) 03/26/2017  . Closed fracture of body of thoracic vertebra ([place]) 03/26/2017  . Spinal stenosis of lumbar region 03/26/2017   PCP:  [place]:   [id number] [address] [place]Phone: [phone]: [phone] [place] [address]Phone: [phone]: [address] [place] [address]Phone: [phone]: [phone]    Social Determinants of Health (SDOH) Interventions    Readmission Risk Interventions Readmission Risk Prevention Plan 07/24/2019  Transportation Screening Complete  PCP or Specialist Appt within 3-5 Days Complete  HRI or [place]  Social Work Consult for R[place]anning/Counseling Complete  Palliative Care Screening Not Applicable  Medication Review (Press photographer Complete  Some recent data might be hidden   MEmeterio Reeve LLatanya Presser LSherrelwoodSocial Worker [phone]

## 2020-05-03 NOTE — Procedures (Signed)
Patient Name: Tim Bradley  MRN: [id number]  Epilepsy Attending: Lora Havens  Referring Physician/Provider: Dr. Blanchie Dessert Date: 05/02/2020 Duration: 24.48 minutes  Patient history: 69 year old male with history of polysubstance abuse who presented after witnessed seizure-like activity.  EEG evaluate for seizures.  Level of alertness: Awake, asleep  AEDs during EEG study: Keppra  Technical aspects: This EEG study was done with scalp electrodes positioned according to the 10-20 International system of electrode placement. Electrical activity was acquired at a sampling rate of 500Hz  and reviewed with a high frequency filter of 70Hz  and a low frequency filter of 1Hz . EEG data were recorded continuously and digitally stored.   Description: During awake state, no clear posterior dominant rhythm was seen.  Sleep was characterized by vertex waves, sleep spindles (12 to 14 Hz), maximal frontocentral region.  EEG showed continuous generalized 5 to 6 Hz theta slowing.  Hyperventilation and photic stimulation were not performed.     ABNORMALITY -Continuous slow, generalized  IMPRESSION: This study is suggestive of mild diffuse encephalopathy, nonspecific etiology. No seizures or epileptiform discharges were seen throughout the recording.  Edson Deridder [name]

## 2020-05-03 NOTE — Progress Notes (Addendum)
Subjective: Patient reports he was overall feeling better, initially in the morning he reported feeling numb in the lower extremities with an inconsistent sensory examination, however on reexamination later in the afternoon he reported that all of the numbness had resolved.  He expressed a good understanding of needing to stop using cocaine and partying, get more consistent sleep, and to take his antiseizure medication to avoid seizures in the future.  He was surprised to learn that alcohol use can also contribute to lowering the seizure threshold.  He did describe frequently feeling a prodrome before having a seizure.  Exam: Vitals:   05/03/20 0055 05/03/20 0653  BP: 127/69 138/79  Pulse: 74 78  Resp: 20 20  Temp:  98 F (36.7 C)  SpO2: 95% 94%   Gen: In bed, NAD Resp: non-labored breathing, no acute distress Cardiac: Perfusing extremities well, with a regular rate Abd: soft, nt Psychiatric: Cooperative with appropriate affect  Neuro: MS: In the morning he was alert and oriented, in the afternoon he was sleepy but awoke to light touch of the lower extremities CN: External ocular muscles intact, hearing intact to voice Motor: Able to stand with assistance of walker and 1 person assistance Sensory: Initially reported patchy inconsistent reduced sensation below the waist which had resolved on subsequent exam in the afternoon  Pertinent Labs: Cr 2.75 (baseline), GFR 26,  No significant electrolyte derangements BNP 740 (baseline 924)  A1c 5.9 (04/22/20) with glucoses in the 80s - low 100s  Trop 21 -> 19  Utox (+) for cocaine, no ethanol level obtained UA bland  CXR no acute infectious process (personally reviewed)  HCT with chronic microvascular disease, no acute blood products, stable hyperdensity in left frontal lobe (personally reviewed, agree with radiology read)  EEG showed some slowing, but no elective form activity, personally reviewed MRI personally reviewed some chronic  microvascular changes but no acute infarct or other acute intracranial process  Impression: Tim Bradley is a 69 year old male.  At this point it is unclear whether he truly has epilepsy or provoked seizures, given his ongoing cocaine use.  However given he experiences a prodrome, I would favor continuing antiseizure medication as this is more suggestive of a focal onset.   Recommendations: --ContinueKeppra IV 500 mg twice daily and change to p.o. when capable of taking p.o. medications --Seizure precautions --.Per [facility] statutes, patients with seizures are not allowed to drive until they have been seizure-free for six months. Use caution when using heavy equipment or power tools. Avoid working on ladders or at heights. Take showers instead of baths. Ensure the water temperature is not too high on the home water heater. Do not go swimming alone. When caring for infants or small children, sit down when holding, feeding, or changing them to minimize risk of injury to the child in the event you have a seizure. Also, Maintain good sleep hygiene. Avoid alcohol. -- CIWA protocol to be discontinued at the discretion of the primary team -- EtOH cessation counseling personally completed -- outpatient neurology follow-up -- Neurology will sign off at this time given the patient has returned to baseline  35 minutes were spent in care and counseling of this patient  Lesleigh Noe MD-PhD [facility] [phone]  If 7pm- 7am, please page neurology on call as listed in [place].  Addended for charge capture

## 2020-05-03 NOTE — Progress Notes (Signed)
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  He was admitted on July 2021 for acute decompensated CHF and he left AMA at that time.  He was confused on presentation.  She was suspected to have seizures, so neurology consulted.  His mental status improved and he became alert and oriented after presentation to the emergency department but he was frequently somnolent and intermittently falling asleep during the conversation.    Patient reported seizures at home.  He was not taking any medications and was just taking cocaine. Lab results showed elevated BNP.UDS was positive for cocaine. Started on Keppra, EEG ordered.  Cardiology and neurology are also following.  Assessment & Plan:   Principal Problem:   Acute CHF (congestive heart failure) (HCC) Active Problems:   Generalized weakness   Acute encephalopathy   Diarrhea   QT prolongation   Pressure injury of skin   Acute exacerbation of chronic combined systolic/diastolic congestive heart failure: Echo on 04/11/2020 showed ejection fraction of less than 93%, grade 1 diastolic dysfunction.  He appeared volume overloaded on presentation with significant peripheral edema.  He was not hypoxic though.  Elevated BNP at 740.  Chest x-ray showed cardiomegaly with mild central vascular congestion.  Started on Lasix 40 mg IV twice a day.  Continue monitoring input/output, daily weight.  He is on Coreg and BiDil at home which we will resume. Hheart failure team also follow  him here.  Diarrhea: No fever, leukocytosis or signs of sepsis.  Lactate is normal.  C. difficile/GI pathogen panel have been ordered. But he doesn't have any bowel movements.Will dc precautions  Acute encephalopathy/suspected seizure episodes: Confusion on presentation but he was completely alert and oriented during my evaluation today.  Ammonia level normal.  There was suspicion of a small subdural hemorrhage in the CT head but after being reviewed by neurology, this is less likely.  Patient reports history of frequent falls.  Patient is noncompliant with medications and has not taken any seizure medications at home.  Started on Keppra.  EEG is showed mild diffuse encephalopathy, no seizures or epileptiform discharges.  MRI of the brain has been ordered by neurology.  Neurology following.  QTC prolongation: Monitor electrolytes.  QTC this morning is 514.  Monitor on telemetry.  Suspected UTI: UA was equivocal.  No fever, leukocytosis or signs of sepsis.  Urine culture has been ordered.  No indication for antibiotic therapy.  Chronic alcohol abuse: Monitor CIWA.  Continue thiamine, folate, multivitamin.  He says he drinks vodka  CKD stage III-4: Creatinine in the range of 2.  Currently stable at baseline.  Substance abuse: UDS positive for cocaine.  Family counseled for cessation.  Hypertension: Currently normotensive.  On Coreg and BiDil at home which we will resume  Diet-controlled type 2 diabetes mellitus: Hemoglobin C 5.9 as per 7/21.  Continue sliding scale insulin  BPH: On Flomax  Poor living condition/generalized weakness: Patient states he does not have any family members.  He cannot walk due to back pain, lower extremity pain.  PT and OT were consulted and he has been  recommended skilled facility on discharge.  Social worker following.  Patient states he is interested to go home when clinically  ready.           DVT prophylaxis: SCD Code Status: Full Family Communication:  None at the bedside Status is: Inpatient  Remains inpatient appropriate because:Ongoing diagnostic testing needed not appropriate for outpatient work up   Dispo: The patient is from: Home              Anticipated d/c is to: SNF              Anticipated d/c date is: 3 days              Patient currently is not medically stable to d/c.   Consultants:   Procedures:None  Antimicrobials:  Anti-infectives (From admission, onward)   None      Subjective: Patient seen and examined at the bedside this morning.  Currently hemodynamically stable, comfortable and alert and oriented.  Denies any specific complaints besides back pain, and lower extremity weakness.    Objective: Vitals:   05/03/20 0005 05/03/20 0055 05/03/20 0653 05/03/20 0816  BP: (!) 125/57 127/69 138/79 (!) 101/59  Pulse: 74 74 78 82  Resp: 16 20 20 19   Temp: 98.1 F (36.7 C)  98 F (36.7 C) 98.8 F (37.1 C)  TempSrc: Axillary  Oral Oral  SpO2: 99% 95% 94% 98%  Weight:  94.1 kg 94.1 kg   Height:  6\' 2"  (1.88 m)      Intake/Output Summary (Last 24 hours) at 05/03/2020 0846 Last data filed at 05/03/2020 0708 Gross per 24 hour  Intake 240 ml  Output 700 ml  Net -460 ml   Filed Weights   05/03/20 0055 05/03/20 0653  Weight: 94.1 kg 94.1 kg    Examination:  General exam: Deconditioned, debilitated, weak HEENT:PERRL,Oral mucosa moist, Ear/Nose normal on gross exam Respiratory system: Bilateral equal air entry, normal vesicular breath sounds, no wheezes or crackles  Cardiovascular system: S1 & S2 heard, RRR. No JVD, murmurs, rubs, gallops or clicks. No pedal edema. Gastrointestinal system: Abdomen is nondistended, soft and nontender. No organomegaly or masses felt. Normal bowel sounds heard. Central nervous system: Alert and oriented. No focal neurological deficits. Extremities: Trace bilateral lower extremity edema, no clubbing ,no cyanosis Skin: No rashes, lesions or ulcers,no icterus ,no pallor   Data  Reviewed: I have personally reviewed following labs and imaging studies  CBC: Recent Labs  Lab 05/02/20 1528  WBC 6.0  NEUTROABS 3.6  HGB 11.9*  HCT 38.2*  MCV 91.4  PLT 676   Basic Metabolic Panel: Recent Labs  Lab 04/26/20 1112 05/02/20 1528 05/02/20 2025 05/03/20 0441  NA 139 138  --  140  K 3.9 4.4  --  3.9  CL 103 105  --  102  CO2 28 25  --  27  GLUCOSE 95 100*  --  152*  BUN 50* 56*  --  54*  CREATININE 2.50* 2.61*  --  2.75*  CALCIUM 8.8* 8.7*  --  8.7*  MG  --   --  2.4  --   PHOS  --   --   --  4.2   GFR: Estimated Creatinine Clearance: 28.6 mL/min (A) (by C-G formula based on SCr of 2.75 mg/dL (H)). Liver Function Tests: Recent Labs  Lab 05/02/20 1528  AST 33  ALT 20  ALKPHOS 59  BILITOT 0.5  PROT 5.9*  ALBUMIN 3.1*   Recent Labs  Lab 05/02/20 1528  LIPASE 25   Recent Labs  Lab 05/02/20 1855  AMMONIA 32   Coagulation Profile: No results for input(s): INR, PROTIME in the last 168 hours. Cardiac Enzymes: No results for input(s): CKTOTAL, CKMB, CKMBINDEX, TROPONINI in the last 168 hours. BNP (last 3 results) No results for input(s): PROBNP in the last 8760 hours. HbA1C: No results for input(s): HGBA1C in the last 72 hours. CBG: Recent Labs  Lab 05/02/20 2126 05/03/20 0815  GLUCAP 89 136*   Lipid Profile: No results for input(s): CHOL, HDL, LDLCALC, TRIG, CHOLHDL, LDLDIRECT in the last 72 hours. Thyroid Function Tests: No results for input(s): TSH, T4TOTAL, FREET4, T3FREE, THYROIDAB in the last 72 hours. Anemia Panel: No results for input(s): VITAMINB12, FOLATE, FERRITIN, TIBC, IRON, RETICCTPCT in the last 72 hours. Sepsis Labs: Recent Labs  Lab 05/02/20 1528  LATICACIDVEN 1.4    Recent Results (from the past 240 hour(s))  SARS Coronavirus 2 by RT PCR     Status: None   Collection Time: 04/25/20 12:41 PM  Result Value Ref Range Status   SARS Coronavirus 2 NEGATIVE NEGATIVE Final    Comment: (NOTE) Result indicates the  ABSENCE of SARS-CoV-2 RNA in the patient specimen.   The lowest concentration of SARS-CoV-2 viral copies this assay can detect in nasopharyngeal swab specimens is 500 copies / mL.  A negative result does not preclude SARS-CoV-2 infection and should not be used as the sole basis for patient management decisions. A negative result may occur with improper specimen collection / handling, submission of a specimen other than nasopharyngeal swab, presence of viral mutation(s) within the areas targeted by this assay, and inadequate number of viral copies (<500 copies / mL) present.  Negative results must be combined with clinical observations, patient history, and epidemiological information.   The expected result is NEGATIVE.   Patient Fact Sheet:  BlogSelections.co.uk    Provider Fact Sheet:  https://lucas.com/    This test is not yet approved or cleared by the [place] FDA and  has been [place] orized for detection and/or diagnosis of SARS-CoV-2 by FDA under an Emergency Use Authorization (EUA).  This EUA will remain in effect (meaning this test can be used) for the duration of  the COVID-19 declaration under Section 564(b)(1) of the Act, 21 U.S.C. section 360bbb-3(b)(1), unless the authorization is terminated or revoked sooner  Performed at [facility], [place] [address], [place], [place] [address]   SARS Coronavirus 2 by RT PCR (hospital order, performed in [facility] hospital lab) Nasopharyngeal     Status: None   Collection Time: 05/02/20  4:23 PM   Specimen: Nasopharyngeal  Result Value Ref Range Status   SARS Coronavirus 2 NEGATIVE NEGATIVE Final    Comment: (NOTE) SARS-CoV-2 target nucleic acids are NOT DETECTED.  The SARS-CoV-2 RNA is generally detectable in upper and lower respiratory specimens during the acute phase of infection. The lowest concentration of SARS-CoV-2 viral copies this assay can detect is 250 copies /  mL. A negative result does not preclude SARS-CoV-2 infection and should not be used as the sole basis for treatment or other patient management decisions.  A negative result may occur with improper specimen collection / handling, submission of specimen other than nasopharyngeal swab, presence of viral mutation(s) within the areas targeted by this assay, and inadequate number of viral copies (<250 copies / mL). A negative result must be combined with clinical observations, patient history, and epidemiological information.  Fact Sheet for Patients:  StrictlyIdeas.no  Fact Sheet for Healthcare Providers: BankingDealers.co.za  This test is not yet approved or  cleared by the [place] FDA and has been authorized for detection and/or diagnosis of SARS-CoV-2 by FDA under an Emergency Use Authorization (EUA).  This EUA will remain in effect (meaning this test can be used) for the duration of the COVID-19 declaration under Section 564(b)(1) of the Act, 21 U.S.C. section 360bbb-3(b)(1), unless the authorization is terminated or revoked sooner.  Performed at [facility], [place] [address], [place], [place] [address]          Radiology Studies: CT HEAD WO CONTRAST  Result Date: 05/02/2020 CLINICAL DATA:  Follow-up subarachnoid hemorrhage EXAM: CT HEAD WITHOUT CONTRAST TECHNIQUE: Contiguous axial images were obtained from the base of the skull through the vertex without intravenous contrast. COMPARISON:  CT 05/02/2020, 04/13/2020 FINDINGS: Brain: Stable appearance of the hyperattenuating focus along the mesial aspect of the left frontal lobe in the region of the cingulate gyrus. Corresponds well to a similar hyperattenuating focus on the most recent and more remote comparison images likely reflecting some chronic thrombus or plaque within the left A2 segment of the anterior cerebral artery. No new sites of hemorrhage are identified. No  evidence of acute infarction, hydrocephalus, extra-axial collection or mass lesion/mass effect. Patchy areas of white matter hypoattenuation are most compatible with chronic microvascular angiopathy. Symmetric prominence of the ventricles, cisterns and sulci compatible with parenchymal volume loss. Vascular: Hyperdensity in the region of the left A2 segment remains stable. Additional calcification in the carotid siphons. Skull: No significant scalp swelling or hematoma. Benign-appearing dermal calcifications. No acute or suspicious osseous lesions. Sinuses/Orbits: Minimal mural thickening in the ethmoid air cells. Paranasal sinuses and mastoid air cells are otherwise predominantly clear. Included orbital structures are unremarkable. Other: None IMPRESSION: 1. Stable appearance of the hyperattenuating focus along the mesial aspect of the left frontal lobe in the region of the cingulate gyrus. Corresponds well to a similar hyperattenuating focus on the most recent as well as more remote comparison images likely reflecting some chronic thrombus or plaque within the left A2 segment of the anterior cerebral artery. 2. No new sites of hemorrhage are identified. 3. Stable parenchymal volume loss and chronic microvascular angiopathy. Electronically Signed   By: Lovena Le M.D.   On: 05/02/2020 23:35   CT Head Wo Contrast  Addendum Date: 05/02/2020   ADDENDUM REPORT: 05/02/2020 20:34 ADDENDUM: Case discussed with Dr. Lorraine Lax. The hyperattenuating focus along the cingulate gyrus corresponds to an A2 branch of the anterior cerebral artery and is unchanged compared to 04/13/2020. This may represent chronic thrombus or hyperdense atherosclerotic plaque within the ACA. Electronically Signed   By: Ulyses Jarred M.D.   On: 05/02/2020 20:34   Result Date: 05/02/2020 CLINICAL DATA:  Confusion, found on ground EXAM: CT HEAD WITHOUT CONTRAST TECHNIQUE: Contiguous axial images were obtained from the base of the skull through the  vertex without intravenous contrast. COMPARISON:  CT 04/13/2020 FINDINGS: Brain: Small amount of hyperdensity is present along surface of the mesial left frontal lobe, along the cingulate gyrus and sulcus of the corpus callosum. No other acute intracranial hyperdensity or hemorrhage is seen. No CT evidence of acute large territory infarct. No mass effect or midline shift. No evidence of hydrocephaly. Basal cisterns are patent. Symmetric prominence of the ventricles, cisterns and sulci compatible with parenchymal volume loss. Patchy areas of white matter hypoattenuation are most compatible with chronic microvascular angiopathy. Vascular: Atherosclerotic calcification of the carotid siphons.  No hyperdense vessel. Skull: No significant scalp swelling or hematoma. No subjacent calvarial fractures within the limitations of motion artifact. No visible facial bone fractures within the margins of imaging. Sinuses/Orbits: Mild thickening in the ethmoids. Small benign osteoma present in the left ethmoids. Remaining paranasal sinuses and mastoids are predominantly clear. Middle ear cavities are clear. Included orbital structures are unremarkable. Other: None IMPRESSION: 1. Small amount of hyperdensity along the mesial surface of the left frontal lobe, along the cingulate gyrus and sulcus of the corpus callosum. Findings are concerning for a small amount of subarachnoid hemorrhage in the setting of potentially unwitnessed fall. 2. No significant scalp swelling or thickening. No acute osseous injury. 3. Background of parenchymal volume loss, chronic microvascular angiopathy and intracranial atherosclerosis. Critical Value/emergent results were called by telephone at the time of interpretation on 05/02/2020 at 4:54 pm to provider [place] , who verbally acknowledged these results. Electronically Signed: By: Lovena Le M.D. On: 05/02/2020 16:55   DG Chest Portable 1 View  Result Date: 05/02/2020 CLINICAL DATA:  69 year old  male with heart failure. Diarrhea. EXAM: PORTABLE CHEST 1 VIEW COMPARISON:  Chest radiograph dated 04/22/2020 FINDINGS: There is cardiomegaly with mild central vascular congestion. No focal consolidation, pleural effusion, or pneumothorax. Atherosclerotic calcification of the aortic arch. No acute osseous pathology. IMPRESSION: Cardiomegaly with mild central vascular congestion. No focal consolidation. Electronically Signed   By: Anner Crete M.D.   On: 05/02/2020 15:44        Scheduled Meds: . folic acid  1 mg Oral Daily  . furosemide  40 mg Intravenous BID  . insulin aspart  0-5 Units Subcutaneous QHS  . insulin aspart  0-9 Units Subcutaneous TID WC  . multivitamin with minerals  1 tablet Oral Daily  . sodium chloride flush  3 mL Intravenous Q12H  . thiamine  100 mg Oral Daily   Or  . thiamine  100 mg Intravenous Daily   Continuous Infusions: . sodium chloride    . levETIRAcetam       LOS: 1 day    Time spent:35 mins. More than 50% of that time was spent in counseling and/or coordination of care.      Shelly Coss, MD [facility] P8/01/2020, 8:46 AM

## 2020-05-03 NOTE — Consult Note (Addendum)
Cardiology Consultation:   Patient ID: ERYCK NEGRON MRN: [id number]; DOB: Sep 19, 1951  Admit date: 05/02/2020 Date of Consult: 05/03/2020  Primary Care Provider: [facility] [place] Cardiologist: Jenkins Rouge, MD  [place] Electrophysiologist:  None    Patient Profile:   Tim Bradley is a 69 y.o. male with a hx of  hypertension, seizure disorder, DM 2, polysubstance abuse (tobacco, alcohol and cocaine), hypertension, TAA and recently diagnosed systolic heart failure who is being seen today for the evaluation of CHF at the request of Dr. Tawanna Solo.  History of Present Illness:   Tim Bradley is a 69 year old male with past medical history of hypertension, seizure disorder, DM 2, polysubstance abuse (tobacco, alcohol and cocaine), TAA and recently diagnosed systolic heart failure.  Patient was admitted in mid July 2021 with acute CHF exacerbation.  Echocardiogram obtained on 04/11/2020 showed EF less than 20%, global hypokinesis, grade 1 DD, trivial MR, dilated thoracic aorta measuring at 41 mm.  He was placed on carvedilol and Entresto.  Tim Bradley was later switched to BiDil given CKD.  The plan was to optimize medical therapy and repeat echocardiogram in 3 months, if EF remains low, then consider further ischemic work-up.  It was suspected this is more likely to be nonischemic cardiomyopathy given his drug use.  Unfortunately prior to the patient was fully diuresed, he left the hospital AMA.  He was readmitted on 04/22/2020 with acute CHF and underwent further diuresis.  He was eventually released from the hospital with a dry weight of 72.4 kg which is 159.6 pounds.  Unfortunately, even after he got released from the hospital, he never picked up any of his heart failure medication.  He says he could not afford them.  Our social worker try to arrange for him to go to skilled nursing facility as he is nonambulatory at home and gets around only with a wheelchair.  He has very poor social  support.  Unfortunately he did not agree to go to the skilled nursing facility.    When his friend checked on him yesterday, his mental status was altered and he was having a seizure like activity.  He also had incontinence of stool.  Patient was eventually sent back to the [facility] for readmission.  Urine drug test was positive for cocaine again.  Initial CT of the head showed a small amount of hypertension density along the left frontal lobe concerning for subarachnoid hemorrhage, however this was reviewed by neurology team and felt the same hyperdensity was also present on the previous CT.  EEG was ordered and result is currently pending.  Cardiology service has been consulted for CHF.  Intake and output is inaccurate as the patient is incontinent.  He has been placed on IV Lasix 40 mg twice daily.  Cardiology service consulted for acute heart failure.    Past Medical History:  Diagnosis Date  . Abscess 02/2017   LEFT GLUTEAL   . Acid reflux   . Alcohol abuse   . Anemia   . CHF (congestive heart failure) ([place])   . 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 Cocaine abuse ([place])   . DDD (degenerative disc disease), lumbar   . Diabetes mellitus without complication ([place])   . Dyspnea   . Enlarged prostate   . Hypertension   . Spinal stenosis, lumbar     Past Surgical History:  Procedure Laterality Date  . BACK SURGERY     cervical  x2  . BIOPSY  08/21/2018   Procedure: BIOPSY;  Surgeon: Ronald Lobo, MD;  Location: [place];  Service: Endoscopy;;  . ESOPHAGOGASTRODUODENOSCOPY (EGD) WITH PROPOFOL N/A 08/21/2018   Procedure: ESOPHAGOGASTRODUODENOSCOPY (EGD) WITH PROPOFOL;  Surgeon: Ronald Lobo, MD;  Location: [place] [facility];  Service: Endoscopy;  Laterality: N/A;  . INCISION AND DRAINAGE PERIRECTAL ABSCESS N/A 03/26/2017   Procedure: IRRIGATION AND DEBRIDEMENT PERIRECTAL ABSCESS;  Surgeon: Rolm Bookbinder, MD;  Location: [place];   Service: General;  Laterality: N/A;     Home Medications:  Prior to Admission medications   Medication Sig Start Date End Date Taking? Authorizing Provider  aspirin EC 81 MG EC tablet Take 1 tablet (81 mg total) by mouth daily. Swallow whole. 04/14/20  Yes Sheikh, Omair Latif, DO  carvedilol (COREG) 3.125 MG tablet Take 1 tablet (3.125 mg total) by mouth 2 (two) times daily with a meal. 04/13/20  Yes Sheikh, Omair Latif, DO  furosemide (LASIX) 40 MG tablet Take 1 tablet (40 mg total) by mouth daily. 04/11/20  Yes Larene Pickett, PA-C  isosorbide-hydrALAZINE (BIDIL) 20-37.5 MG tablet Take 1 tablet by mouth 3 (three) times daily. 04/13/20  Yes Sheikh, Omair Latif, DO  levETIRAcetam (KEPPRA) 500 MG tablet Take 1 tablet (500 mg total) by mouth 2 (two) times daily. 03/19/20  Yes Corena Herter, PA-C  tamsulosin (FLOMAX) 0.4 MG CAPS capsule Take 1 capsule (0.4 mg total) by mouth daily after supper. 01/27/20  Yes Deno Etienne, DO    Inpatient Medications: Scheduled Meds: . folic acid  1 mg Oral Daily  . furosemide  40 mg Intravenous BID  . insulin aspart  0-5 Units Subcutaneous QHS  . insulin aspart  0-9 Units Subcutaneous TID WC  . multivitamin with minerals  1 tablet Oral Daily  . sodium chloride flush  3 mL Intravenous Q12H  . thiamine  100 mg Oral Daily   Or  . thiamine  100 mg Intravenous Daily   Continuous Infusions: . sodium chloride    . levETIRAcetam     PRN Meds: sodium chloride, acetaminophen, LORazepam **OR** LORazepam, sodium chloride flush  Allergies:   No Known Allergies  Social History:   Social History   Socioeconomic History  . Marital status: Widowed    Spouse name: Not on file  . Number of children: 0  . Years of education: Not on file  . Highest education level: High school graduate  Occupational History  . Occupation: retired  Tobacco Use  . Smoking status: Current Every Day Smoker    Packs/day: 0.25    Types: Cigarettes  . Smokeless tobacco: Never Used   Vaping Use  . Vaping Use: Never used  Substance and Sexual Activity  . Alcohol use: Yes    Alcohol/week: 12.0 standard drinks    Types: 12 Cans of beer per week    Comment: i can of beer daily  . Drug use: Yes    Types: Cocaine, Marijuana    Comment: 03/2020   . Sexual activity: Not on file  Other Topics Concern  . Not on file  Social History Narrative   [place] veteran   Social Determinants of Health   Financial Resource Strain:   . Difficulty of Paying Living Expenses:   Food Insecurity:   . Worried About Charity fundraiser in the Last Year:   . Arboriculturist in the Last Year:   Transportation Needs:   . Film/video editor (Medical):   Marland Kitchen Lack of Transportation (Non-Medical):  Physical Activity:   . Days of Exercise per Week:   . Minutes of Exercise per Session:   Stress:   . Feeling of Stress :   Social Connections:   . Frequency of Communication with Friends and Family:   . Frequency of Social Gatherings with Friends and Family:   . Attends Religious Services:   . Active Member of Clubs or Organizations:   . Attends Archivist Meetings:   Marland Kitchen Marital Status:   Intimate Partner Violence:   . Fear of Current or Ex-Partner:   . Emotionally Abused:   Marland Kitchen Physically Abused:   . Sexually Abused:     Family History:    Family History  Problem Relation Age of Onset  . Hypertension Mother   . Cancer - Lung Father      ROS:  Please see the history of present illness.   All other ROS reviewed and negative.     Physical Exam/Data:   Vitals:   05/03/20 0005 05/03/20 0055 05/03/20 0653 05/03/20 0816  BP: (!) 125/57 127/69 138/79 (!) 101/59  Pulse: 74 74 78 82  Resp: 16 20 20 19   Temp: 98.1 F (36.7 C)  98 F (36.7 C) 98.8 F (37.1 C)  TempSrc: Axillary  Oral Oral  SpO2: 99% 95% 94% 98%  Weight:  94.1 kg 94.1 kg   Height:  6\' 2"  (1.88 m)      Intake/Output Summary (Last 24 hours) at 05/03/2020 0930 Last data filed at 05/03/2020 0708 Gross per  24 hour  Intake 240 ml  Output 700 ml  Net -460 ml   Last 3 Weights 05/03/2020 05/03/2020 04/26/2020  Weight (lbs) 207 lb 7.3 oz 207 lb 7.3 oz 203 lb 11.2 oz  Weight (kg) 94.1 kg 94.1 kg 92.398 kg     Body mass index is 26.64 kg/m.  General:  Frail, barely standing up with 4 legged walker with assistance from PT HEENT: normal Lymph: no adenopathy Neck: no JVD Endocrine:  No thryomegaly Vascular: No carotid bruits; FA pulses 2+ bilaterally without bruits  Cardiac:  normal S1, S2; RRR; no murmur  Lungs:  clear to auscultation bilaterally, no wheezing, rhonchi or rales  Abd: soft, nontender, no hepatomegaly  Ext: no edema Musculoskeletal:  No deformities, BUE and BLE strength normal and equal Skin: warm and dry  Neuro:  CNs 2-12 intact, no focal abnormalities noted Psych:  Normal affect   EKG:  The EKG was personally reviewed and demonstrates:  NSR with LBBB Telemetry:  Telemetry was personally reviewed and demonstrates:  NSR without significant ventricular ectopy  Relevant CV Studies:  Echo 04/11/2020 1. Left ventricular ejection fraction, by estimation, is <20%. The left  ventricle has severely decreased function. The left ventricle demonstrates  global hypokinesis. There is moderate concentric left ventricular  hypertrophy. Left ventricular diastolic  parameters are consistent with Grade I diastolic dysfunction (impaired  relaxation).  2. Right ventricular systolic function is mildly reduced. The right  ventricular size is normal. There is normal pulmonary artery systolic  pressure.  3. Left atrial size was mildly dilated.  4. The mitral valve is normal in structure. Trivial mitral valve  regurgitation. No evidence of mitral stenosis.  5. The aortic valve is tricuspid. Aortic valve regurgitation is not  visualized. No aortic stenosis is present.  6. Aortic dilatation noted. Aneurysm of the ascending aorta, measuring 41  mm. There is mild dilatation at the level of the  sinuses of Valsalva  measuring 37 mm.  7. The inferior vena cava is dilated in size with >50% respiratory  variability, suggesting right atrial pressure of 8 mmHg.   Laboratory Data:  High Sensitivity Troponin:   Recent Labs  Lab 04/11/20 0027 04/22/20 0323 04/22/20 0708 05/02/20 1528 05/02/20 1844  TROPONINIHS 14 16 12  21* 19*     Chemistry Recent Labs  Lab 04/26/20 1112 05/02/20 1528 05/03/20 0441  NA 139 138 140  K 3.9 4.4 3.9  CL 103 105 102  CO2 28 25 27   GLUCOSE 95 100* 152*  BUN 50* 56* 54*  CREATININE 2.50* 2.61* 2.75*  CALCIUM 8.8* 8.7* 8.7*  GFRNONAA 25* 24* 22*  GFRAA 29* 27* 26*  ANIONGAP 8 8 11     Recent Labs  Lab 05/02/20 1528  PROT 5.9*  ALBUMIN 3.1*  AST 33  ALT 20  ALKPHOS 59  BILITOT 0.5   Hematology Recent Labs  Lab 05/02/20 1528  WBC 6.0  RBC 4.18*  HGB 11.9*  HCT 38.2*  MCV 91.4  MCH 28.5  MCHC 31.2  RDW 13.0  PLT 262   BNP Recent Labs  Lab 05/02/20 1528  BNP 740.3*    DDimer No results for input(s): DDIMER in the last 168 hours.   Radiology/Studies:  CT HEAD WO CONTRAST  Result Date: 05/02/2020 CLINICAL DATA:  Follow-up subarachnoid hemorrhage EXAM: CT HEAD WITHOUT CONTRAST TECHNIQUE: Contiguous axial images were obtained from the base of the skull through the vertex without intravenous contrast. COMPARISON:  CT 05/02/2020, 04/13/2020 FINDINGS: Brain: Stable appearance of the hyperattenuating focus along the mesial aspect of the left frontal lobe in the region of the cingulate gyrus. Corresponds well to a similar hyperattenuating focus on the most recent and more remote comparison images likely reflecting some chronic thrombus or plaque within the left A2 segment of the anterior cerebral artery. No new sites of hemorrhage are identified. No evidence of acute infarction, hydrocephalus, extra-axial collection or mass lesion/mass effect. Patchy areas of white matter hypoattenuation are most compatible with chronic microvascular  angiopathy. Symmetric prominence of the ventricles, cisterns and sulci compatible with parenchymal volume loss. Vascular: Hyperdensity in the region of the left A2 segment remains stable. Additional calcification in the carotid siphons. Skull: No significant scalp swelling or hematoma. Benign-appearing dermal calcifications. No acute or suspicious osseous lesions. Sinuses/Orbits: Minimal mural thickening in the ethmoid air cells. Paranasal sinuses and mastoid air cells are otherwise predominantly clear. Included orbital structures are unremarkable. Other: None IMPRESSION: 1. Stable appearance of the hyperattenuating focus along the mesial aspect of the left frontal lobe in the region of the cingulate gyrus. Corresponds well to a similar hyperattenuating focus on the most recent as well as more remote comparison images likely reflecting some chronic thrombus or plaque within the left A2 segment of the anterior cerebral artery. 2. No new sites of hemorrhage are identified. 3. Stable parenchymal volume loss and chronic microvascular angiopathy. Electronically Signed   By: Lovena Le M.D.   On: 05/02/2020 23:35   CT Head Wo Contrast  Addendum Date: 05/02/2020   ADDENDUM REPORT: 05/02/2020 20:34 ADDENDUM: Case discussed with Dr. Lorraine Lax. The hyperattenuating focus along the cingulate gyrus corresponds to an A2 branch of the anterior cerebral artery and is unchanged compared to 04/13/2020. This may represent chronic thrombus or hyperdense atherosclerotic plaque within the ACA. Electronically Signed   By: Ulyses Jarred M.D.   On: 05/02/2020 20:34   Result Date: 05/02/2020 CLINICAL DATA:  Confusion, found on ground EXAM: CT HEAD WITHOUT CONTRAST TECHNIQUE: Contiguous  axial images were obtained from the base of the skull through the vertex without intravenous contrast. COMPARISON:  CT 04/13/2020 FINDINGS: Brain: Small amount of hyperdensity is present along surface of the mesial left frontal lobe, along the cingulate  gyrus and sulcus of the corpus callosum. No other acute intracranial hyperdensity or hemorrhage is seen. No CT evidence of acute large territory infarct. No mass effect or midline shift. No evidence of hydrocephaly. Basal cisterns are patent. Symmetric prominence of the ventricles, cisterns and sulci compatible with parenchymal volume loss. Patchy areas of white matter hypoattenuation are most compatible with chronic microvascular angiopathy. Vascular: Atherosclerotic calcification of the carotid siphons. No hyperdense vessel. Skull: No significant scalp swelling or hematoma. No subjacent calvarial fractures within the limitations of motion artifact. No visible facial bone fractures within the margins of imaging. Sinuses/Orbits: Mild thickening in the ethmoids. Small benign osteoma present in the left ethmoids. Remaining paranasal sinuses and mastoids are predominantly clear. Middle ear cavities are clear. Included orbital structures are unremarkable. Other: None IMPRESSION: 1. Small amount of hyperdensity along the mesial surface of the left frontal lobe, along the cingulate gyrus and sulcus of the corpus callosum. Findings are concerning for a small amount of subarachnoid hemorrhage in the setting of potentially unwitnessed fall. 2. No significant scalp swelling or thickening. No acute osseous injury. 3. Background of parenchymal volume loss, chronic microvascular angiopathy and intracranial atherosclerosis. Critical Value/emergent results were called by telephone at the time of interpretation on 05/02/2020 at 4:54 pm to provider Davenport , who verbally acknowledged these results. Electronically Signed: By: Lovena Le M.D. On: 05/02/2020 16:55   DG Chest Portable 1 View  Result Date: 05/02/2020 CLINICAL DATA:  69 year old male with heart failure. Diarrhea. EXAM: PORTABLE CHEST 1 VIEW COMPARISON:  Chest radiograph dated 04/22/2020 FINDINGS: There is cardiomegaly with mild central vascular congestion. No focal  consolidation, pleural effusion, or pneumothorax. Atherosclerotic calcification of the aortic arch. No acute osseous pathology. IMPRESSION: Cardiomegaly with mild central vascular congestion. No focal consolidation. Electronically Signed   By: Anner Crete M.D.   On: 05/02/2020 15:44    Assessment and Plan:   1. Chronic systolic CHF  -Recent echocardiogram obtained on 04/11/2020 showed EF less than 20%, global hypokinesis.  -He was treated medically initially with carvedilol and Entresto, Entresto was later switched to BiDil due to CKD stage IV  -Unfortunately, patient never picked up the heart failure medication after he was last discharged.  Per patient, he [facility] medication [facility]  -On exam, he does not appears to be significantly volume overloaded.  Previous discharge dry weight was 92.4 kg, today's weight is 94.1 kg.  On exam, he has no crackles in his lung nor does he have any significant lower extremity edema.  Chest x-ray obtained yesterday showed cardiomegaly with mild central vascular congestion with no focal consolidation.  Based on physical exam, I do not think he is significantly volume overloaded.  -We will need to reattempt to restart heart failure medication during this admission and make sure he can obtain meds from [place].   - continue on current diuretic  2. Seizure: His friends found him on the floor with stool incontinence and also having generalized shaking concerning for seizure.  Per patient, he only started having seizures in the past 3 months.  Currently evaluated by neurology.  3. Polysubstance abuse: He was discharged 6 days ago, unfortunately urine drug test came back positive for cocaine again.  4. CKD stage IV  5. hypertension: Need to restart heart failure medication.  He was discharged recently on carvedilol and BiDil.  Since he is not significantly volume overloaded, I recommend restarting carvedilol first.  6. DM2: Managed by primary  team  7. Thoracic aortic aneurysm: Recent echocardiogram showed a thoracic aortic aneurysm measuring at 41 mm.  8. Noncompliance: He left AMA from the hospital in mid July and ended up back to the hospital a few days later was acute CHF exacerbation.  Despite the fact this is the third hospitalization recently, patient never picked up any of his heart failure medication.  9. Poor social support: Patient is nonambulatory, he gets around with a wheelchair at home.  His living condition is quite poor with no heating and limited air conditioning.  Social worker previously discussed with the patient regarding sending him to a skilled nursing facility after the last admission, however he refused.  However I do not think he can take care of himself at home.  10. Abnormal CT of head: There was some initial concern of a subarachnoid hemorrhage, however this was related reviewed by neurology service who felt hyperdensity that was seen is unchanged when compared to the previous image.  11. LBBB      For questions or updates, please contact [place] Please consult [email or web address] for contact info under    Signed, Almyra Deforest, [place]  05/03/2020 9:30 AM  Patient seen and examined   I agree with findings as noted above by Janan Ridge.  Pt is a 69 yo with hx of noncompliance (left AMA on recent admit in July), HTN, polysubstnace abuse and systolic CHF (last EF on echo lesss than 20% with concentric LVH  RV mildly reduced)     Ptt not a good historian  Brought in by friend for seizure activity   UDS + for cocaine  EEG pending    Since admit he was placed on 40 mg lasix IV  On exam, pt with minimal responses Neck:  JVP is inormal Lungs are CTA  anteirorly Cardiac exam RRR  No S3  No murmurs     Ext are with Tr edema    Labs signif for BUN / Cr 2.75   (within range of baseline)  Volume appears mildly  increased on my exam   He has diuresed net neg 1 L    Would continue IV lasix today  Reassess in  am Compliance is issure   When more lucid will need to address with him   Would use BiDil for now.   Need generic or samples    Dorris Carnes MD

## 2020-05-03 NOTE — Progress Notes (Signed)
OT Cancellation Note  Patient Details Name: Tim Bradley MRN: [id number] DOB: [birth year]   Cancelled Treatment:    Reason Eval/Treat Not Completed: Patient off unit for MRI. Will follow up as able for OT eval. Thanks.  Michel Bickers, OTR/[facility] [phone]  Francesca Jewett 05/03/2020, 7:38 PM

## 2020-05-03 NOTE — Evaluation (Addendum)
Physical Therapy Evaluation Patient Details Name: Tim Bradley MRN: [id number] DOB: 1951/09/06 Today's Date: 05/03/2020   History of Present Illness  Pt is a 69 y.o. M with significant PMH of polysubstance abuse, combined systolic and diastolic heart failure, hypertension, type 2 diabetes mellitus, CKD, seizure disorder who presents with seizure like activity and AMS. CT head unremarkable for acute findings, small amount of hyperdensity in left frontal lobe concerning for [facility] - also present on old SAH. UDS positive for cocaine.   Clinical Impression  On PT evaluation, pt oriented to place, situation, but not oriented to time. Following all one step commands. Requiring moderate assist for bed mobility and transfers. SpO2 96-98% on RA, BP 140/84 post mobility. Pt reporting numbness distal to waist but intact to light touch upon assessment. No focal strength deficits noted but pt grossly weak, particularly BLE's. Pt presents with deconditioning, balance deficits, decreased endurance, and cognitive impairments. He has inconsistent family/friend support in addition to poor housing condition per chart review. Recommending SNF in light of deficits and decreased caregiver support in order to maximize functional mobility. Pt in agreement currently.     Follow Up Recommendations SNF;Supervision/Assistance - 24 hour    Equipment Recommendations  None recommended by PT    Recommendations for Other Services       Precautions / Restrictions Precautions Precautions: Fall Restrictions Weight Bearing Restrictions: No      Mobility  Bed Mobility Overal bed mobility: Needs Assistance Bed Mobility: Supine to Sit;Sit to Supine     Supine to sit: Mod assist Sit to supine: Mod assist   General bed mobility comments: ModA to pull up to sit and for BLE assist back into bed  Transfers Overall transfer level: Needs assistance Equipment used: Rolling walker (2 wheeled) Transfers: Sit to/from Stand Sit  to Stand: Mod assist         General transfer comment: Pt pulling up from walker, modA to boost up from edge of bed. Crouched posture  Ambulation/Gait             General Gait Details: deferred as pt unable to achieve fully upright  Stairs            Wheelchair Mobility    Modified Rankin (Stroke Patients Only)       Balance Overall balance assessment: Needs assistance Sitting-balance support: Bilateral upper extremity supported;Feet supported Sitting balance-Leahy Scale: Poor Sitting balance - Comments: Reliant on BUE support, supervision for safety   Standing balance support: Bilateral upper extremity supported;During functional activity Standing balance-Leahy Scale: Poor Standing balance comment: relies on UE and external support                             Pertinent Vitals/Pain Pain Assessment: Faces Faces Pain Scale: Hurts little more Pain Location: back Pain Descriptors / Indicators: Aching;Grimacing;Guarding Pain Intervention(s): Monitored during session    Home Living Family/patient expects to be discharged to:: Private residence Living Arrangements: Non-relatives/Friends (girlfriend) Available Help at Discharge: Friend(s);Available PRN/intermittently Type of Home: House Home Access: Stairs to enter Entrance Stairs-Rails: None Entrance Stairs-Number of Steps: 2 Home Layout: One level Home Equipment: Walker - 2 wheels;Cane - single point;Wheelchair - manual Additional Comments: Per chart review, poor living conditions. Pt often sleeps on floor    Prior Function Level of Independence: Independent with assistive device(s)         Comments: Uses wheelchair, recurrent falls     Hand Dominance   Dominant  Hand: Left    Extremity/Trunk Assessment   Upper Extremity Assessment Upper Extremity Assessment: RUE deficits/detail;LUE deficits/detail RUE Deficits / Details: Grossly 4/5 LUE Deficits / Details: Grossly 4/5    Lower  Extremity Assessment Lower Extremity Assessment: RLE deficits/detail;LLE deficits/detail RLE Deficits / Details: Grossly 3/5 RLE Sensation: WNL LLE Deficits / Details: Grossly 3/5 except ankle dorsiflexion 2/5 LLE Sensation: WNL    Cervical / Trunk Assessment Cervical / Trunk Assessment: Normal  Communication   Communication: No difficulties  Cognition Arousal/Alertness: Awake/alert Behavior During Therapy: WFL for tasks assessed/performed Overall Cognitive Status: Impaired/Different from baseline Area of Impairment: Orientation;Awareness                 Orientation Level: Disoriented to;Time         Awareness: Emergent   General Comments: Pt oriented to place, situation. Not oriented to time, stating year was 2017. Frequently falling back asleep.      General Comments      Exercises     Assessment/Plan    PT Assessment Patient needs continued PT services  PT Problem List Decreased strength;Decreased range of motion;Decreased activity tolerance;Decreased balance;Decreased mobility;Decreased cognition;Decreased safety awareness;Cardiopulmonary status limiting activity       PT Treatment Interventions DME instruction;Functional mobility training;Therapeutic activities;Therapeutic exercise;Balance training;Patient/family education    PT Goals (Current goals can be found in the Care Plan section)  Acute Rehab PT Goals Patient Stated Goal: to get stronger PT Goal Formulation: With patient Time For Goal Achievement: 05/17/20 Potential to Achieve Goals: Fair    Frequency Min 2X/week   Barriers to discharge Decreased caregiver support      Co-evaluation               AM-PAC PT "6 Clicks" Mobility  Outcome Measure Help needed turning from your back to your side while in a flat bed without using bedrails?: A Little Help needed moving from lying on your back to sitting on the side of a flat bed without using bedrails?: A Lot Help needed moving to and  from a bed to a chair (including a wheelchair)?: A Lot Help needed standing up from a chair using your arms (e.g., wheelchair or bedside chair)?: A Lot Help needed to walk in hospital room?: A Lot Help needed climbing 3-5 steps with a railing? : Total 6 Click Score: 12    End of Session Equipment Utilized During Treatment: Gait belt Activity Tolerance: Patient limited by pain;Patient limited by fatigue Patient left: in bed;with call bell/phone within reach;with bed alarm set Nurse Communication: Mobility status PT Visit Diagnosis: Unsteadiness on feet (R26.81);Muscle weakness (generalized) (M62.81);History of falling (Z91.81);Repeated falls (R29.6)    Time: 0347-4259 PT Time Calculation (min) (ACUTE ONLY): 31 min   Charges:   PT Evaluation $PT Eval Moderate Complexity: 1 Mod PT Treatments $Therapeutic Activity: 8-22 mins          Wyona Almas, PT, DPT Acute Rehabilitation Services Pager [phone] Office [phone]   Deno Etienne 05/03/2020, 9:18 AM

## 2020-05-04 ENCOUNTER — Inpatient Hospital Stay (HOSPITAL_COMMUNITY): Payer: No Typology Code available for payment source

## 2020-05-04 DIAGNOSIS — G9340 Encephalopathy, unspecified: Secondary | ICD-10-CM

## 2020-05-04 DIAGNOSIS — R531 Weakness: Secondary | ICD-10-CM

## 2020-05-04 LAB — URINE CULTURE: Culture: NO GROWTH

## 2020-05-04 LAB — BASIC METABOLIC PANEL
Anion gap: 11 (ref 5–15)
BUN: 57 mg/dL — ABNORMAL HIGH (ref 8–23)
CO2: 27 mmol/L (ref 22–32)
Calcium: 8.6 mg/dL — ABNORMAL LOW (ref 8.9–10.3)
Chloride: 103 mmol/L (ref 98–111)
Creatinine, Ser: 3.01 mg/dL — ABNORMAL HIGH (ref 0.61–1.24)
GFR calc Af Amer: 23 mL/min — ABNORMAL LOW (ref >60)
GFR calc non Af Amer: 20 mL/min — ABNORMAL LOW (ref >60)
Glucose, Bld: 101 mg/dL — ABNORMAL HIGH (ref 70–99)
Potassium: 4 mmol/L (ref 3.5–5.1)
Sodium: 141 mmol/L (ref 135–145)

## 2020-05-04 LAB — GLUCOSE, CAPILLARY
Glucose-Capillary: 118 mg/dL — ABNORMAL HIGH (ref 70–99)
Glucose-Capillary: 186 mg/dL — ABNORMAL HIGH (ref 70–99)
Glucose-Capillary: 96 mg/dL (ref 70–99)
Glucose-Capillary: 130 mg/dL — ABNORMAL HIGH (ref 70–99)

## 2020-05-04 MED ORDER — DIVALPROEX SODIUM 250 MG PO DR TAB
500.0000 mg | DELAYED_RELEASE_TABLET | Freq: Two times a day (BID) | ORAL | Status: DC
  Administered 2020-05-04 – 2020-05-09 (×10): 500 mg via ORAL
  Filled 2020-05-04 (×10): qty 2

## 2020-05-04 MED ORDER — DIVALPROEX SODIUM 250 MG PO DR TAB
1000.0000 mg | DELAYED_RELEASE_TABLET | Freq: Once | ORAL | Status: AC
  Administered 2020-05-04: 1000 mg via ORAL
  Filled 2020-05-04: qty 4

## 2020-05-04 MED ORDER — LORAZEPAM 2 MG/ML IJ SOLN
1.0000 mg | Freq: Once | INTRAMUSCULAR | Status: AC
  Administered 2020-05-04: 1 mg via INTRAVENOUS
  Filled 2020-05-04: qty 1

## 2020-05-04 MED ORDER — LORAZEPAM 1 MG PO TABS
1.0000 mg | ORAL_TABLET | Freq: Once | ORAL | Status: AC
  Administered 2020-05-04: 1 mg via ORAL
  Filled 2020-05-04: qty 1

## 2020-05-04 NOTE — Progress Notes (Signed)
Notified patient had seizure while MD in room, when this RN arrived patient was coming to from seizure, Patient lethargic but able to answer orientation questions and follow commands. Patient had episode of incontinence. MD [place] notified BP 154/101, O2 93%, HR 80- 70s, Resp 20

## 2020-05-04 NOTE — Progress Notes (Signed)
OT Cancellation Note  Patient Details Name: Tim Bradley MRN: [id number] DOB: 09-Oct-1950   Cancelled Treatment:    Reason Eval/Treat Not Completed: Medical issues which prohibited therapy (Pt had seizure this AM prior to OT session, OT will hold until medically stable)  Zenovia Jarred, MSOT, OTR/L [place] [facility] Office Number: [phone] Pager: [phone]  Zenovia Jarred 05/04/2020, 9:46 AM

## 2020-05-04 NOTE — Progress Notes (Signed)
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  He was admitted on July 2021 for acute decompensated CHF and he left AMA at that time.  He was confused on presentation.  She was suspected to have seizures, so neurology consulted.  His mental status improved and he became alert and oriented after presentation to the emergency department but he was frequently somnolent and intermittently falling asleep during the conversation.    Patient reported seizures at home.  He was not taking any medications and was just taking cocaine. Lab results showed elevated BNP.UDS was positive for cocaine. Started on Keppra, EEG ordered.  Cardiology and neurology are also following.  05/04/2020: Patient seen alongside patient's friend.  Patient had another seizure today.  Neurology input is highly appreciated.  Neurology team is managing patient's seizure medication.  MRI of the cervical and thoracic spine is planned for today.  No alcohol withdrawal symptoms reported to date.    Assessment & Plan:   Principal Problem:   Acute on chronic congestive heart failure (HCC) Active Problems:   Generalized weakness   Acute encephalopathy   Diarrhea   QT prolongation   Pressure injury of skin   Acute exacerbation of chronic combined systolic/diastolic congestive heart failure: Echo on 04/11/2020 showed ejection fraction of less than 32%, grade 1 diastolic dysfunction.  He appeared volume overloaded on presentation with significant peripheral  edema.  He was not hypoxic though.  Elevated BNP at 740.  Chest x-ray showed cardiomegaly with mild central vascular congestion.  Started on Lasix 40 mg IV twice a day.  Continue monitoring input/output, daily weight.  He is on Coreg and BiDil at home which we will resume. Hheart failure team also follow him here. 05/04/2020: CHF seems compensated present.  Diarrhea: No fever, leukocytosis or signs of sepsis.  Lactate is normal.  C. difficile/GI pathogen panel have been ordered. But he doesn't have any bowel movements.Will dc precautions 05/04/2020: No diarrhea is reported.  Acute encephalopathy/suspected seizure episodes: Confusion on presentation but he was completely alert and oriented during my evaluation today.  Ammonia level normal.  There was suspicion of a small subdural hemorrhage in the CT head but after being reviewed by neurology, this is less likely.  Patient reports history of frequent falls.  Patient is noncompliant with medications and has not taken any seizure medications at home.  Started on Keppra.  EEG is showed mild diffuse encephalopathy, no seizures or epileptiform discharges.  MRI of the brain has been ordered by neurology.  Neurology following. 05/04/2020: Mentation remains stable.  QTC prolongation: Monitor electrolytes.  QTC this morning is 514.  Monitor on telemetry. 05/04/2020: Corrected QTc interval today is 466 ms.  Suspected UTI: UA was equivocal.  No fever, leukocytosis or signs of sepsis.  Urine culture has been ordered.  No indication for antibiotic therapy. 05/04/2020: Final urine culture result is still pending.  Chronic alcohol abuse: Monitor CIWA.  Continue thiamine, folate, multivitamin.  He says he drinks vodka 05/04/2020: Patient denies being an alcoholic!  CKD stage III-4: Creatinine in the range of 2.  Currently  stable at baseline. 05/04/2020: Follow-up with nephrology on discharge.  Substance abuse: UDS positive for cocaine.  Family counseled for  cessation.  Hypertension: Currently normotensive.  On Coreg and BiDil at home which we will resume  Diet-controlled type 2 diabetes mellitus: Hemoglobin C 5.9 as per 7/21.  Continue sliding scale insulin  BPH: On Flomax  Poor living condition/generalized weakness: Patient states he does not have any family members.  He cannot walk due to back pain, lower extremity pain.  PT and OT were consulted and he has been recommended skilled facility on discharge.  Social worker following.  Patient states he is interested to go home when clinically  ready.   DVT prophylaxis: SCD Code Status: Full Family Communication: None at the bedside Status is: Inpatient  Remains inpatient appropriate because:Ongoing diagnostic testing needed not appropriate for outpatient work up   Dispo: The patient is from: Home              Anticipated d/c is to: SNF              Anticipated d/c date is: 3 days              Patient currently is not medically stable to d/c.   Consultants:   Procedures:None  Antimicrobials:  Anti-infectives (From admission, onward)   None      Subjective: Patient seen alongside patient's friend. Patient had seizure earlier today, otherwise, no new complaints.  Objective: Vitals:   05/04/20 0457 05/04/20 0500 05/04/20 0835 05/04/20 1219  BP:  131/76 116/72 118/60  Pulse: 65 71 73 77  Resp:  17  20  Temp:  98.4 F (36.9 C)  98.6 F (37 C)  TempSrc:  Oral  Oral  SpO2:  99%  93%  Weight:  93 kg    Height:        Intake/Output Summary (Last 24 hours) at 05/04/2020 1510 Last data filed at 05/04/2020 1219 Gross per 24 hour  Intake 1320 ml  Output 1850 ml  Net -530 ml   Filed Weights   05/03/20 0055 05/03/20 0653 05/04/20 0500  Weight: 94.1 kg 94.1 kg 93 kg    Examination: General exam: Not in any distress. Awake and alert. HEENT: Pallor. No jaundice. Respiratory system: Clear to auscultation. Cardiovascular system: S1 & S2 heard, RRR. No JVD, murmurs, rubs,  gallops or clicks. No pedal edema. Gastrointestinal system: Abdomen is nondistended, soft and nontender. No organomegaly or masses felt. Normal bowel sounds heard. Central nervous system: Alert and oriented. No focal neurological deficits. Extremities: Trace bilateral lower extremity edema   Data Reviewed: I have personally reviewed following labs and imaging studies  CBC: Recent Labs  Lab 05/02/20 1528  WBC 6.0  NEUTROABS 3.6  HGB 11.9*  HCT 38.2*  MCV 91.4  PLT 696   Basic Metabolic Panel: Recent Labs  Lab 05/02/20 1528 05/02/20 2025 05/03/20 0441 05/04/20 0516  NA 138  --  140 141  K 4.4  --  3.9 4.0  CL 105  --  102 103  CO2 25  --  27 27  GLUCOSE 100*  --  152* 101*  BUN 56*  --  54* 57*  CREATININE 2.61*  --  2.75* 3.01*  CALCIUM 8.7*  --  8.7* 8.6*  MG  --  2.4  --   --   PHOS  --   --  4.2  --    GFR: Estimated Creatinine Clearance: 26.2 mL/min (A) (by C-G formula based on  SCr of 3.01 mg/dL (H)). Liver Function Tests: Recent Labs  Lab 05/02/20 1528  AST 33  ALT 20  ALKPHOS 59  BILITOT 0.5  PROT 5.9*  ALBUMIN 3.1*   Recent Labs  Lab 05/02/20 1528  LIPASE 25   Recent Labs  Lab 05/02/20 1855  AMMONIA 32   Coagulation Profile: No results for input(s): INR, PROTIME in the last 168 hours. Cardiac Enzymes: No results for input(s): CKTOTAL, CKMB, CKMBINDEX, TROPONINI in the last 168 hours. BNP (last 3 results) No results for input(s): PROBNP in the last 8760 hours. HbA1C: No results for input(s): HGBA1C in the last 72 hours. CBG: Recent Labs  Lab 05/03/20 1218 05/03/20 1635 05/03/20 2101 05/04/20 0755 05/04/20 1216  GLUCAP 133* 127* 118* 118* 186*   Lipid Profile: No results for input(s): CHOL, HDL, LDLCALC, TRIG, CHOLHDL, LDLDIRECT in the last 72 hours. Thyroid Function Tests: No results for input(s): TSH, T4TOTAL, FREET4, T3FREE, THYROIDAB in the last 72 hours. Anemia Panel: No results for input(s): VITAMINB12, FOLATE, FERRITIN,  TIBC, IRON, RETICCTPCT in the last 72 hours. Sepsis Labs: Recent Labs  Lab 05/02/20 1528  LATICACIDVEN 1.4    Recent Results (from the past 240 hour(s))  SARS Coronavirus 2 by RT PCR     Status: None   Collection Time: 04/25/20 12:41 PM  Result Value Ref Range Status   SARS Coronavirus 2 NEGATIVE NEGATIVE Final    Comment: (NOTE) Result indicates the ABSENCE of SARS-CoV-2 RNA in the patient specimen.   The lowest concentration of SARS-CoV-2 viral copies this assay can detect in nasopharyngeal swab specimens is 500 copies / mL.  A negative result does not preclude SARS-CoV-2 infection and should not be used as the sole basis for patient management decisions. A negative result may occur with improper specimen collection / handling, submission of a specimen other than nasopharyngeal swab, presence of viral mutation(s) within the areas targeted by this assay, and inadequate number of viral copies (<500 copies / mL) present.  Negative results must be combined with clinical observations, patient history, and epidemiological information.   The expected result is NEGATIVE.   Patient Fact Sheet:  BlogSelections.co.uk    Provider Fact Sheet:  https://lucas.com/    This test is not yet approved or cleared by the [place] FDA and  has been [place] orized for detection and/or diagnosis of SARS-CoV-2 by FDA under an Emergency Use Authorization (EUA).  This EUA will remain in effect (meaning this test can be used) for the duration of  the COVID-19 declaration under Section 564(b)(1) of the Act, 21 U.S.C. section 360bbb-3(b)(1), unless the authorization is terminated or revoked sooner  Performed at [facility], [place] [address], [place], [place] [address]   SARS Coronavirus 2 by RT PCR (hospital order, performed in [facility] hospital lab) Nasopharyngeal     Status: None   Collection Time: 05/02/20  4:23 PM   Specimen:  Nasopharyngeal  Result Value Ref Range Status   SARS Coronavirus 2 NEGATIVE NEGATIVE Final    Comment: (NOTE) SARS-CoV-2 target nucleic acids are NOT DETECTED.  The SARS-CoV-2 RNA is generally detectable in upper and lower respiratory specimens during the acute phase of infection. The lowest concentration of SARS-CoV-2 viral copies this assay can detect is 250 copies / mL. A negative result does not preclude SARS-CoV-2 infection and should not be used as the sole basis for treatment or other patient management decisions.  A negative result may occur with improper specimen collection / handling, submission  of specimen other than nasopharyngeal swab, presence of viral mutation(s) within the areas targeted by this assay, and inadequate number of viral copies (<250 copies / mL). A negative result must be combined with clinical observations, patient history, and epidemiological information.  Fact Sheet for Patients:   StrictlyIdeas.no  Fact Sheet for Healthcare Providers: BankingDealers.co.za  This test is not yet approved or  cleared by the [place] FDA and has been authorized for detection and/or diagnosis of SARS-CoV-2 by FDA under an Emergency Use Authorization (EUA).  This EUA will remain in effect (meaning this test can be used) for the duration of the COVID-19 declaration under Section 564(b)(1) of the Act, 21 U.S.C. section 360bbb-3(b)(1), unless the authorization is terminated or revoked sooner.  Performed at [facility], [place] [address], [place], [place] [address]          Radiology Studies: CT HEAD WO CONTRAST  Result Date: 05/02/2020 CLINICAL DATA:  Follow-up subarachnoid hemorrhage EXAM: CT HEAD WITHOUT CONTRAST TECHNIQUE: Contiguous axial images were obtained from the base of the skull through the vertex without intravenous contrast. COMPARISON:  CT 05/02/2020, 04/13/2020 FINDINGS: Brain: Stable appearance of  the hyperattenuating focus along the mesial aspect of the left frontal lobe in the region of the cingulate gyrus. Corresponds well to a similar hyperattenuating focus on the most recent and more remote comparison images likely reflecting some chronic thrombus or plaque within the left A2 segment of the anterior cerebral artery. No new sites of hemorrhage are identified. No evidence of acute infarction, hydrocephalus, extra-axial collection or mass lesion/mass effect. Patchy areas of white matter hypoattenuation are most compatible with chronic microvascular angiopathy. Symmetric prominence of the ventricles, cisterns and sulci compatible with parenchymal volume loss. Vascular: Hyperdensity in the region of the left A2 segment remains stable. Additional calcification in the carotid siphons. Skull: No significant scalp swelling or hematoma. Benign-appearing dermal calcifications. No acute or suspicious osseous lesions. Sinuses/Orbits: Minimal mural thickening in the ethmoid air cells. Paranasal sinuses and mastoid air cells are otherwise predominantly clear. Included orbital structures are unremarkable. Other: None IMPRESSION: 1. Stable appearance of the hyperattenuating focus along the mesial aspect of the left frontal lobe in the region of the cingulate gyrus. Corresponds well to a similar hyperattenuating focus on the most recent as well as more remote comparison images likely reflecting some chronic thrombus or plaque within the left A2 segment of the anterior cerebral artery. 2. No new sites of hemorrhage are identified. 3. Stable parenchymal volume loss and chronic microvascular angiopathy. Electronically Signed   By: Lovena Le M.D.   On: 05/02/2020 23:35   CT Head Wo Contrast  Addendum Date: 05/02/2020   ADDENDUM REPORT: 05/02/2020 20:34 ADDENDUM: Case discussed with Dr. Lorraine Lax. The hyperattenuating focus along the cingulate gyrus corresponds to an A2 branch of the anterior cerebral artery and is unchanged  compared to 04/13/2020. This may represent chronic thrombus or hyperdense atherosclerotic plaque within the ACA. Electronically Signed   By: Ulyses Jarred M.D.   On: 05/02/2020 20:34   Result Date: 05/02/2020 CLINICAL DATA:  Confusion, found on ground EXAM: CT HEAD WITHOUT CONTRAST TECHNIQUE: Contiguous axial images were obtained from the base of the skull through the vertex without intravenous contrast. COMPARISON:  CT 04/13/2020 FINDINGS: Brain: Small amount of hyperdensity is present along surface of the mesial left frontal lobe, along the cingulate gyrus and sulcus of the corpus callosum. No other acute intracranial hyperdensity or hemorrhage is seen. No CT evidence of acute large territory  infarct. No mass effect or midline shift. No evidence of hydrocephaly. Basal cisterns are patent. Symmetric prominence of the ventricles, cisterns and sulci compatible with parenchymal volume loss. Patchy areas of white matter hypoattenuation are most compatible with chronic microvascular angiopathy. Vascular: Atherosclerotic calcification of the carotid siphons. No hyperdense vessel. Skull: No significant scalp swelling or hematoma. No subjacent calvarial fractures within the limitations of motion artifact. No visible facial bone fractures within the margins of imaging. Sinuses/Orbits: Mild thickening in the ethmoids. Small benign osteoma present in the left ethmoids. Remaining paranasal sinuses and mastoids are predominantly clear. Middle ear cavities are clear. Included orbital structures are unremarkable. Other: None IMPRESSION: 1. Small amount of hyperdensity along the mesial surface of the left frontal lobe, along the cingulate gyrus and sulcus of the corpus callosum. Findings are concerning for a small amount of subarachnoid hemorrhage in the setting of potentially unwitnessed fall. 2. No significant scalp swelling or thickening. No acute osseous injury. 3. Background of parenchymal volume loss, chronic microvascular  angiopathy and intracranial atherosclerosis. Critical Value/emergent results were called by telephone at the time of interpretation on 05/02/2020 at 4:54 pm to provider [place] , who verbally acknowledged these results. Electronically Signed: By: Lovena Le M.D. On: 05/02/2020 16:55   MR BRAIN WO CONTRAST  Result Date: 05/03/2020 CLINICAL DATA:  Seizure, abnormal neuro exam. EXAM: MRI HEAD WITHOUT CONTRAST TECHNIQUE: Multiplanar, multiecho pulse sequences of the brain and surrounding structures were obtained without intravenous contrast. COMPARISON:  Head CT 05/02/2020, brain MRI 07/21/2019. FINDINGS: Brain: The examination is intermittently motion degraded. Most notably, there is mild motion degradation of the axial T2/FLAIR sequence, mild motion degradation of the axial T1 weighted sequence, mild motion degradation of the coronal and coronal T2/FLAIR sequences oriented perpendicular to the long axis of the hippocampi and mild motion degradation of the coronal T2 weighted whole brain sequence. Mild generalized parenchymal atrophy. Moderate scattered T2/FLAIR hyperintensity within the cerebral white matter is nonspecific, but consistent with chronic small vessel ischemic disease. These findings are stable as compared to the prior MRI of 07/21/2019. The hippocampi are symmetric in size and signal. There is no acute infarct. No evidence of intracranial mass. No chronic intracranial blood products. No extra-axial fluid collection. No midline shift. Vascular: Expected proximal arterial flow voids. Skull and upper cervical spine: No focal marrow lesion. Susceptibility artifact from cervical spinal fusion hardware. Sinuses/Orbits: Visualized orbits show no acute finding. Mild ethmoid sinus mucosal thickening at the imaged levels. No significant mastoid effusion. IMPRESSION: Motion degraded examination as described. No evidence of acute intracranial abnormality. No specific seizure focus is identified. Mild  generalized parenchymal atrophy and moderate cerebral white matter chronic small vessel ischemic changes, stable as compared to the MRI of 07/21/2019. Mild ethmoid sinus mucosal thickening. Electronically Signed   By: Kellie Simmering DO   On: 05/03/2020 20:09   DG Chest Portable 1 View  Result Date: 05/02/2020 CLINICAL DATA:  69 year old male with heart failure. Diarrhea. EXAM: PORTABLE CHEST 1 VIEW COMPARISON:  Chest radiograph dated 04/22/2020 FINDINGS: There is cardiomegaly with mild central vascular congestion. No focal consolidation, pleural effusion, or pneumothorax. Atherosclerotic calcification of the aortic arch. No acute osseous pathology. IMPRESSION: Cardiomegaly with mild central vascular congestion. No focal consolidation. Electronically Signed   By: Anner Crete M.D.   On: 05/02/2020 15:44   EEG adult  Result Date: 05/03/2020 Lora Havens, MD     05/03/2020  9:59 AM Patient Name: KAREM TOMASO MRN: [id number] Epilepsy Attending: Cecil Cranker  Hortense Ramal Referring Physician/Provider: Dr. Blanchie Dessert Date: 05/02/2020 Duration: 24.48 minutes Patient history: 69 year old male with history of polysubstance abuse who presented after witnessed seizure-like activity.  EEG evaluate for seizures. Level of alertness: Awake, asleep AEDs during EEG study: Keppra Technical aspects: This EEG study was done with scalp electrodes positioned according to the 10-20 International system of electrode placement. Electrical activity was acquired at a sampling rate of 500Hz  and reviewed with a high frequency filter of 70Hz  and a low frequency filter of 1Hz . EEG data were recorded continuously and digitally stored. Description: During awake state, no clear posterior dominant rhythm was seen.  Sleep was characterized by vertex waves, sleep spindles (12 to 14 Hz), maximal frontocentral region.  EEG showed continuous generalized 5 to 6 Hz theta slowing.  Hyperventilation and photic stimulation were not performed.    ABNORMALITY -Continuous slow, generalized IMPRESSION: This study is suggestive of mild diffuse encephalopathy, nonspecific etiology. No seizures or epileptiform discharges were seen throughout the recording. Priyanka [name]        Scheduled Meds:  aspirin EC  81 mg Oral Daily   carvedilol  3.125 mg Oral BID WC   divalproex  500 mg Oral B58X   folic acid  1 mg Oral Daily   furosemide  40 mg Intravenous BID   insulin aspart  0-5 Units Subcutaneous QHS   insulin aspart  0-9 Units Subcutaneous TID WC   isosorbide-hydrALAZINE  1 tablet Oral TID   multivitamin with minerals  1 tablet Oral Daily   sodium chloride flush  3 mL Intravenous Q12H   tamsulosin  0.4 mg Oral QPC supper   thiamine  100 mg Oral Daily   Or   thiamine  100 mg Intravenous Daily   Continuous Infusions:  sodium chloride     levETIRAcetam 500 mg (05/04/20 0840)     LOS: 2 days   Time spent:35 mins.  Bonnell Public, MD [facility] P8/02/2020, 3:10 PM

## 2020-05-04 NOTE — Progress Notes (Signed)
Subjective: Breakthrough seizure this morning per nursing. It occurred only 20 minutes after keppra dose.   Exam: Vitals:   05/04/20 0835 05/04/20 1219  BP: 116/72 118/60  Pulse: 73 77  Resp:  20  Temp:  98.6 F (37 C)  SpO2:  93%   Gen: In bed, NAD Resp: non-labored breathing, no acute distress Abd: soft, nt  Neuro: MS: awake, alert, oriented JJ:OACZ, VFF, PERRL Motor: MAEW Sensory: Diminished light touch in a stocking distribution DTR: 2-3+ and symmetric at the knees   Pertinent Labs: Mg 2.4 Na 141 Ca 8.6  Impression: 69 year old male with seizure disorder with breakthrough seizures.  He is already on the maximum dose of Keppra.  I will therefore start a second agent at this time.  His complaint about bilateral numbness that is worsening is concerning for possible cervical spine pathology given his history.  I would favor repeat imaging to assess for this.  Recommendations: 1) continue Keppra 500 twice daily 2) start Depakote 500 mg twice daily, give 1 g x 1 3) MRI C,T spine  Roland Rack, MD [facility] [phone]  If 7pm- 7am, please page neurology on call as listed in [place].

## 2020-05-04 NOTE — TOC Progression Note (Addendum)
Transition of Care [facility]) - Progression Note    Patient Details  Name: Tim Bradley MRN: [id number] Date of Birth: November 23, 1950  Transition of [facility]) CM/SW [place], [place] Phone Number: 05/04/2020, 10:33 AM  Clinical Narrative:    4:24pm- CSW received call fro Vicente Males at the [place]. Vicente Males emailed CSW paperwork that needs to be completed and that it needs to be faxed ([phone]) to [name] at the [place] before 11 am to get a desicion by end of Monday. If pt is approved, Vicente Males gave CSW list of [facility] sn[facility] to [facility] in for referrals. CSW will continue to follow.   3:33 CSW called Doroteo Bradford again and left message to return call. The [facility] closes at 4pm and they are not open on weekends.   11:45- CSW spoke to April with the [place] and was told to contact Albesa Seen at [phone]. Doroteo Bradford is the Education officer, museum for the [employer]" that pt is apart of. CSW called Doroteo Bradford and left a message to return call  CSW spoke to pts sister [name] via phone. [name] stated that last time pt was in the hospital he was supposed to go to [facility] but did not. [name] stated that she had arranged pt to go to SNF at the [facility]. [name] stated that pt needs longterm care with the [place]. [name] stated that pts house is not in livable condition to return to. [name] requested that [employer] be removed from his contact list. [name] also stated that Linna Caprice is a good friend but should not have decision making power.   CSW left message with April with the [facility] to try to coordinate SNF care.   CSW will continue to follow.    Expected Discharge Plan: Skilled Nursing Facility Barriers to Discharge: Active Substance Use - Placement, Continued Medical Work up  Expected Discharge Plan and Services Expected Discharge Plan: [place] arrangements for the past 2 months: [place] Determinants of Health (SDOH) Interventions     Readmission Risk Interventions Readmission Risk Prevention Plan 07/24/2019  Transportation Screening Complete  PCP or Specialist Appt within 3-5 Days Complete  HRI or [place] Complete  Social Work Consult for [place] Planning/Counseling Complete  Palliative Care Screening Not Applicable  Medication Review [employer] photographer) Complete  Some recent data might be hidden   Emeterio Reeve, [name], [place] Social Worker [phone]

## 2020-05-04 NOTE — Progress Notes (Addendum)
Progress Note  Patient Name: Tim Bradley Date of Encounter: 05/04/2020  [facility] Cardiologist: Jenkins Rouge, MD   Subjective   No chest pain or SOB, woke easily  Inpatient Medications    Scheduled Meds: . aspirin EC  81 mg Oral Daily  . carvedilol  3.125 mg Oral BID WC  . folic acid  1 mg Oral Daily  . furosemide  40 mg Intravenous BID  . insulin aspart  0-5 Units Subcutaneous QHS  . insulin aspart  0-9 Units Subcutaneous TID WC  . isosorbide-hydrALAZINE  1 tablet Oral TID  . multivitamin with minerals  1 tablet Oral Daily  . sodium chloride flush  3 mL Intravenous Q12H  . tamsulosin  0.4 mg Oral QPC supper  . thiamine  100 mg Oral Daily   Or  . thiamine  100 mg Intravenous Daily   Continuous Infusions: . sodium chloride    . levETIRAcetam 500 mg (05/03/20 2031)   PRN Meds: sodium chloride, acetaminophen, LORazepam **OR** LORazepam, sodium chloride flush   Vital Signs    Vitals:   05/03/20 2025 05/03/20 2300 05/04/20 0457 05/04/20 0500  BP: 107/75 (!) 97/56  131/76  Pulse: 71 73 65 71  Resp: 19 20  17   Temp: 99 F (37.2 C) 98.6 F (37 C)  98.4 F (36.9 C)  TempSrc: Oral Oral  Oral  SpO2: 100% 98%  99%  Weight:    93 kg  Height:        Intake/Output Summary (Last 24 hours) at 05/04/2020 0809 Last data filed at 05/04/2020 0645 Gross per 24 hour  Intake 1300 ml  Output 2850 ml  Net -1550 ml   Last 3 Weights 05/04/2020 05/03/2020 05/03/2020  Weight (lbs) 205 lb 0.4 oz 207 lb 7.3 oz 207 lb 7.3 oz  Weight (kg) 93 kg 94.1 kg 94.1 kg      Telemetry    SR with BBB occ PACs and PVCs - Personally Reviewed  ECG    No new - Personally Reviewed  Physical Exam   GEN: No acute distress.   Neck: No JVD Cardiac: RRR, no murmurs, rubs, or gallops.  Respiratory: Clear to auscultation bilaterally. GI: Soft, nontender, non-distended  MS: No edema; No deformity. Neuro:  Nonfocal  Psych: Normal affect   Labs    High Sensitivity Troponin:   Recent Labs   Lab 04/11/20 0027 04/22/20 0323 04/22/20 0708 05/02/20 1528 05/02/20 1844  TROPONINIHS 14 16 12  21* 19*      Chemistry Recent Labs  Lab 05/02/20 1528 05/03/20 0441 05/04/20 0516  NA 138 140 141  K 4.4 3.9 4.0  CL 105 102 103  CO2 25 27 27   GLUCOSE 100* 152* 101*  BUN 56* 54* 57*  CREATININE 2.61* 2.75* 3.01*  CALCIUM 8.7* 8.7* 8.6*  PROT 5.9*  --   --   ALBUMIN 3.1*  --   --   AST 33  --   --   ALT 20  --   --   ALKPHOS 59  --   --   BILITOT 0.5  --   --   GFRNONAA 24* 22* 20*  GFRAA 27* 26* 23*  ANIONGAP 8 11 11      Hematology Recent Labs  Lab 05/02/20 1528  WBC 6.0  RBC 4.18*  HGB 11.9*  HCT 38.2*  MCV 91.4  MCH 28.5  MCHC 31.2  RDW 13.0  PLT 262    BNP Recent Labs  Lab 05/02/20 1528  BNP 740.3*     DDimer No results for input(s): DDIMER in the last 168 hours.   Radiology    CT HEAD WO CONTRAST  Result Date: 05/02/2020 CLINICAL DATA:  Follow-up subarachnoid hemorrhage EXAM: CT HEAD WITHOUT CONTRAST TECHNIQUE: Contiguous axial images were obtained from the base of the skull through the vertex without intravenous contrast. COMPARISON:  CT 05/02/2020, 04/13/2020 FINDINGS: Brain: Stable appearance of the hyperattenuating focus along the mesial aspect of the left frontal lobe in the region of the cingulate gyrus. Corresponds well to a similar hyperattenuating focus on the most recent and more remote comparison images likely reflecting some chronic thrombus or plaque within the left A2 segment of the anterior cerebral artery. No new sites of hemorrhage are identified. No evidence of acute infarction, hydrocephalus, extra-axial collection or mass lesion/mass effect. Patchy areas of white matter hypoattenuation are most compatible with chronic microvascular angiopathy. Symmetric prominence of the ventricles, cisterns and sulci compatible with parenchymal volume loss. Vascular: Hyperdensity in the region of the left A2 segment remains stable. Additional  calcification in the carotid siphons. Skull: No significant scalp swelling or hematoma. Benign-appearing dermal calcifications. No acute or suspicious osseous lesions. Sinuses/Orbits: Minimal mural thickening in the ethmoid air cells. Paranasal sinuses and mastoid air cells are otherwise predominantly clear. Included orbital structures are unremarkable. Other: None IMPRESSION: 1. Stable appearance of the hyperattenuating focus along the mesial aspect of the left frontal lobe in the region of the cingulate gyrus. Corresponds well to a similar hyperattenuating focus on the most recent as well as more remote comparison images likely reflecting some chronic thrombus or plaque within the left A2 segment of the anterior cerebral artery. 2. No new sites of hemorrhage are identified. 3. Stable parenchymal volume loss and chronic microvascular angiopathy. Electronically Signed   By: Lovena Le M.D.   On: 05/02/2020 23:35   CT Head Wo Contrast  Addendum Date: 05/02/2020   ADDENDUM REPORT: 05/02/2020 20:34 ADDENDUM: Case discussed with Dr. Lorraine Lax. The hyperattenuating focus along the cingulate gyrus corresponds to an A2 branch of the anterior cerebral artery and is unchanged compared to 04/13/2020. This may represent chronic thrombus or hyperdense atherosclerotic plaque within the ACA. Electronically Signed   By: Ulyses Jarred M.D.   On: 05/02/2020 20:34   Result Date: 05/02/2020 CLINICAL DATA:  Confusion, found on ground EXAM: CT HEAD WITHOUT CONTRAST TECHNIQUE: Contiguous axial images were obtained from the base of the skull through the vertex without intravenous contrast. COMPARISON:  CT 04/13/2020 FINDINGS: Brain: Small amount of hyperdensity is present along surface of the mesial left frontal lobe, along the cingulate gyrus and sulcus of the corpus callosum. No other acute intracranial hyperdensity or hemorrhage is seen. No CT evidence of acute large territory infarct. No mass effect or midline shift. No evidence of  hydrocephaly. Basal cisterns are patent. Symmetric prominence of the ventricles, cisterns and sulci compatible with parenchymal volume loss. Patchy areas of white matter hypoattenuation are most compatible with chronic microvascular angiopathy. Vascular: Atherosclerotic calcification of the carotid siphons. No hyperdense vessel. Skull: No significant scalp swelling or hematoma. No subjacent calvarial fractures within the limitations of motion artifact. No visible facial bone fractures within the margins of imaging. Sinuses/Orbits: Mild thickening in the ethmoids. Small benign osteoma present in the left ethmoids. Remaining paranasal sinuses and mastoids are predominantly clear. Middle ear cavities are clear. Included orbital structures are unremarkable. Other: None IMPRESSION: 1. Small amount of hyperdensity along the mesial surface of the left frontal lobe, along the  cingulate gyrus and sulcus of the corpus callosum. Findings are concerning for a small amount of subarachnoid hemorrhage in the setting of potentially unwitnessed fall. 2. No significant scalp swelling or thickening. No acute osseous injury. 3. Background of parenchymal volume loss, chronic microvascular angiopathy and intracranial atherosclerosis. Critical Value/emergent results were called by telephone at the time of interpretation on 05/02/2020 at 4:54 pm to provider [place] , who verbally acknowledged these results. Electronically Signed: By: Lovena Le M.D. On: 05/02/2020 16:55   MR BRAIN WO CONTRAST  Result Date: 05/03/2020 CLINICAL DATA:  Seizure, abnormal neuro exam. EXAM: MRI HEAD WITHOUT CONTRAST TECHNIQUE: Multiplanar, multiecho pulse sequences of the brain and surrounding structures were obtained without intravenous contrast. COMPARISON:  Head CT 05/02/2020, brain MRI 07/21/2019. FINDINGS: Brain: The examination is intermittently motion degraded. Most notably, there is mild motion degradation of the axial T2/FLAIR sequence, mild motion  degradation of the axial T1 weighted sequence, mild motion degradation of the coronal and coronal T2/FLAIR sequences oriented perpendicular to the long axis of the hippocampi and mild motion degradation of the coronal T2 weighted whole brain sequence. Mild generalized parenchymal atrophy. Moderate scattered T2/FLAIR hyperintensity within the cerebral white matter is nonspecific, but consistent with chronic small vessel ischemic disease. These findings are stable as compared to the prior MRI of 07/21/2019. The hippocampi are symmetric in size and signal. There is no acute infarct. No evidence of intracranial mass. No chronic intracranial blood products. No extra-axial fluid collection. No midline shift. Vascular: Expected proximal arterial flow voids. Skull and upper cervical spine: No focal marrow lesion. Susceptibility artifact from cervical spinal fusion hardware. Sinuses/Orbits: Visualized orbits show no acute finding. Mild ethmoid sinus mucosal thickening at the imaged levels. No significant mastoid effusion. IMPRESSION: Motion degraded examination as described. No evidence of acute intracranial abnormality. No specific seizure focus is identified. Mild generalized parenchymal atrophy and moderate cerebral white matter chronic small vessel ischemic changes, stable as compared to the MRI of 07/21/2019. Mild ethmoid sinus mucosal thickening. Electronically Signed   By: Kellie Simmering DO   On: 05/03/2020 20:09   DG Chest Portable 1 View  Result Date: 05/02/2020 CLINICAL DATA:  69 year old male with heart failure. Diarrhea. EXAM: PORTABLE CHEST 1 VIEW COMPARISON:  Chest radiograph dated 04/22/2020 FINDINGS: There is cardiomegaly with mild central vascular congestion. No focal consolidation, pleural effusion, or pneumothorax. Atherosclerotic calcification of the aortic arch. No acute osseous pathology. IMPRESSION: Cardiomegaly with mild central vascular congestion. No focal consolidation. Electronically Signed   By:  Anner Crete M.D.   On: 05/02/2020 15:44   EEG adult  Result Date: 05/03/2020 Lora Havens, MD     05/03/2020  9:59 AM Patient Name: Tim Bradley MRN: [id number] Epilepsy Attending: Lora Havens Referring Physician/Provider: Dr. Blanchie Dessert Date: 05/02/2020 Duration: 24.48 minutes Patient history: 69 year old male with history of polysubstance abuse who presented after witnessed seizure-like activity.  EEG evaluate for seizures. Level of alertness: Awake, asleep AEDs during EEG study: Keppra Technical aspects: This EEG study was done with scalp electrodes positioned according to the 10-20 International system of electrode placement. Electrical activity was acquired at a sampling rate of 500Hz  and reviewed with a high frequency filter of 70Hz  and a low frequency filter of 1Hz . EEG data were recorded continuously and digitally stored. Description: During awake state, no clear posterior dominant rhythm was seen.  Sleep was characterized by vertex waves, sleep spindles (12 to 14 Hz), maximal frontocentral region.  EEG showed continuous generalized 5 to  6 Hz theta slowing.  Hyperventilation and photic stimulation were not performed.   ABNORMALITY -Continuous slow, generalized IMPRESSION: This study is suggestive of mild diffuse encephalopathy, nonspecific etiology. No seizures or epileptiform discharges were seen throughout the recording. Lora Havens    Cardiac Studies   ECHO 04/11/20 IMPRESSIONS    1. Left ventricular ejection fraction, by estimation, is <20%. The left  ventricle has severely decreased function. The left ventricle demonstrates  global hypokinesis. There is moderate concentric left ventricular  hypertrophy. Left ventricular diastolic  parameters are consistent with Grade I diastolic dysfunction (impaired  relaxation).  2. Right ventricular systolic function is mildly reduced. The right  ventricular size is normal. There is normal pulmonary artery systolic   pressure.  3. Left atrial size was mildly dilated.  4. The mitral valve is normal in structure. Trivial mitral valve  regurgitation. No evidence of mitral stenosis.  5. The aortic valve is tricuspid. Aortic valve regurgitation is not  visualized. No aortic stenosis is present.  6. Aortic dilatation noted. Aneurysm of the ascending aorta, measuring 41  mm. There is mild dilatation at the level of the sinuses of Valsalva  measuring 37 mm.  7. The inferior vena cava is dilated in size with >50% respiratory  variability, suggesting right atrial pressure of 8 mmHg.   FINDINGS  Left Ventricle: Left ventricular ejection fraction, by estimation, is  <20%. The left ventricle has severely decreased function. The left  ventricle demonstrates global hypokinesis. The left ventricular internal  cavity size was normal in size. There is  moderate concentric left ventricular hypertrophy. Left ventricular  diastolic parameters are consistent with Grade I diastolic dysfunction  (impaired relaxation).   Right Ventricle: The right ventricular size is normal. No increase in  right ventricular wall thickness. Right ventricular systolic function is  mildly reduced. There is normal pulmonary artery systolic pressure. The  tricuspid regurgitant velocity is 2.35  m/s, and with an assumed right atrial pressure of 8 mmHg, the estimated  right ventricular systolic pressure is 65.7 mmHg.   Left Atrium: Left atrial size was mildly dilated.   Right Atrium: Right atrial size was normal in size.   Pericardium: There is no evidence of pericardial effusion.   Mitral Valve: The mitral valve is normal in structure. Normal mobility of  the mitral valve leaflets. Trivial mitral valve regurgitation. No evidence  of mitral valve stenosis.   Tricuspid Valve: The tricuspid valve is normal in structure. Tricuspid  valve regurgitation is trivial. No evidence of tricuspid stenosis.   Aortic Valve: The aortic valve  is tricuspid. Aortic valve regurgitation is  not visualized. No aortic stenosis is present. Aortic valve mean gradient  measures 2.0 mmHg. Aortic valve peak gradient measures 3.4 mmHg. Aortic  valve area, by VTI measures 2.39  cm.   Pulmonic Valve: The pulmonic valve was normal in structure. Pulmonic valve  regurgitation is not visualized. No evidence of pulmonic stenosis.   Aorta: Aortic dilatation noted. There is mild dilatation at the level of  the sinuses of Valsalva measuring 37 mm. There is an aneurysm involving  the ascending aorta. The aneurysm measures 41 mm.   Venous: The inferior vena cava is dilated in size with greater than 50%  respiratory variability, suggesting right atrial pressure of 8 mmHg.   IAS/Shunts: No atrial level shunt detected by color flow Doppler.      Patient Profile     69 y.o. male with a hx of  hypertension, seizure disorder, DM 2,  polysubstance abuse (tobacco, alcohol and cocaine), hypertension, TAA and recently diagnosed systolic heart failure.   Assessment & Plan    Chronic systolic CHF with Cardiomyopathy FE 20%  --on coreg and Bidil - Entresto stopped with abnormal kidney function but pt not taking. We are starting meds.  BNP was 740 on admit  --neg 2010 and wt down from 94.1 Kg to 93Kg  --Cr 3.01 up from 2.75  --hold lasix today but defer to Dr. Harrington Challenger.   CKD stage !V .  Noncompliance has left AMA in past.    Seizures, polysubstance abuse DM-2 per IM.        For questions or updates, please contact [place] Please consult [email or web address] for contact info under        Signed, Cecilie Kicks, NP  05/04/2020, 8:09 AM     Pt seen and examined     I agree with findings of L [place] above When I went in to see patient he was unresponsive with grimacing then rhythmic movement felt   He came out of this after about 1 min.   Agitated, a little confused  Appeared to be incontinent  ON exam, neuro exam deferred Lungs are CTA Cardiac  RRR  No S3   Ext are without edema  Pt has diuresed about 2 L    With bump in Cr would hold lasix today  Volume is not that bad COntinue Coreg and BiDli  Concerned about compliance   Dorris Carnes MD

## 2020-05-05 DIAGNOSIS — I5043 Acute on chronic combined systolic (congestive) and diastolic (congestive) heart failure: Secondary | ICD-10-CM

## 2020-05-05 DIAGNOSIS — M9972 Connective tissue and disc stenosis of intervertebral foramina of thoracic region: Secondary | ICD-10-CM

## 2020-05-05 DIAGNOSIS — R9431 Abnormal electrocardiogram [ECG] [EKG]: Secondary | ICD-10-CM

## 2020-05-05 DIAGNOSIS — M4802 Spinal stenosis, cervical region: Secondary | ICD-10-CM

## 2020-05-05 LAB — BASIC METABOLIC PANEL
Anion gap: 12 (ref 5–15)
BUN: 60 mg/dL — ABNORMAL HIGH (ref 8–23)
CO2: 25 mmol/L (ref 22–32)
Calcium: 8.5 mg/dL — ABNORMAL LOW (ref 8.9–10.3)
Chloride: 103 mmol/L (ref 98–111)
Creatinine, Ser: 2.9 mg/dL — ABNORMAL HIGH (ref 0.61–1.24)
GFR calc Af Amer: 24 mL/min — ABNORMAL LOW (ref >60)
GFR calc non Af Amer: 21 mL/min — ABNORMAL LOW (ref >60)
Glucose, Bld: 87 mg/dL (ref 70–99)
Potassium: 3.9 mmol/L (ref 3.5–5.1)
Sodium: 140 mmol/L (ref 135–145)

## 2020-05-05 LAB — GLUCOSE, CAPILLARY
Glucose-Capillary: 92 mg/dL (ref 70–99)
Glucose-Capillary: 84 mg/dL (ref 70–99)
Glucose-Capillary: 129 mg/dL — ABNORMAL HIGH (ref 70–99)
Glucose-Capillary: 102 mg/dL — ABNORMAL HIGH (ref 70–99)

## 2020-05-05 NOTE — Progress Notes (Signed)
Pharmacist Heart Failure Core Measure Documentation  Assessment: Tim Bradley has an EF documented as <20% on 04/11/2020 by ECHO.  Rationale: Heart failure patients with left ventricular systolic dysfunction (LVSD) and an EF < 40% should be prescribed an angiotensin converting enzyme inhibitor (ACEI) or angiotensin receptor blocker (ARB) at discharge unless a contraindication is documented in the medical record.  This patient is not currently on an ACEI or ARB for HF.  This note is being placed in the record in order to provide documentation that a contraindication to the use of these agents is present for this encounter.  ACE Inhibitor or Angiotensin Receptor Blocker is contraindicated (specify all that apply)  []   ACEI allergy AND ARB allergy []   Angioedema []   Moderate or severe aortic stenosis []   Hyperkalemia []   Hypotension []   Renal artery stenosis [x]   Worsening renal function, preexisting renal disease or dysfunction  Entresto stopped with abnormal kidney function but pt not taking.   Vertis Kelch, PharmD, BCPS 05/05/2020       3:23 PM  Please check [email or web address] for unit-specific pharmacist phone numbers

## 2020-05-05 NOTE — Progress Notes (Signed)
Subjective: Patient resting comfortably, arouses to voice. Doing well today, no complaints, no further seizure like episodes per patient or bedside RN.   Exam: Vitals:   05/05/20 0851 05/05/20 0900  BP: 121/64   Pulse: 74 75  Resp: 19   Temp: 98.5 F (36.9 C)   SpO2: 94%    Gen: In bed, NAD Resp: non-labored breathing, no acute distress Abd: soft, nt  Neuro: MS: awake, alert, oriented TK:PTWS, VFF, PERRL Motor: MAEW Sensory: Diminished light touch in a stocking distribution DTR: 2-3+ and symmetric at the knees   Pertinent Labs: Mg 2.4 Na 140 Ca 8.5  Pertinent imaging:  MRI C/T spine - Moderate to severe spinal stenosis at C7-T1 with cord compression. Severe foraminal encroachment bilaterally. - Moderate to severe spinal stenosis and cord compression at T10-11 with possible cord signal abnormality. Severe foraminal encroachment bilaterally - Diffuse disc and facet degeneration throughout the thoracic spine   Impression: 69 year old male with hx of seizure disorder with breakthrough seizures, controlled on keppra an depakote at this time. His complaint about bilateral numbness that is worsening is likely result of underlying cervical spine stenosis with associated foraminal encroachment.  Recommendations: 1) continue Keppra 500 twice daily 2) continue Depakote 500 mg twice daily 3) Recommend neurosurgery consult for evaluation of cervical spinal stenosis and foraminal encroachment causing his current sxs of bilateral numbness 4) Neurology team will sign off at this time, please re-consult if needed.    [name] PA-C [facility] [phone]  If 7pm- 7am, please page neurology on call as listed in [place].

## 2020-05-05 NOTE — Consult Note (Signed)
   Providing Compassionate, Quality Care - Together  Neurosurgery Consult  Referring physician: Dr. Marthenia Rolling Reason for referral: Cervical stenosis  Chief Complaint: diarrhea  History of Present Illness: This is a 69 year old male with a history of seizures, polysubstance abuse, CKD, CHF (EF less than 20%), diabetes, hypertension C3-4, 4-5, C6-7 ACDF years ago that presents with diarrhea.  He has a history of cervical myelopathy and numbness and tingling in his hands which he feels like he has been ongoing for years.  He was supposed to have a anterior cervical fusion C5-6, C7-T1 by Dr. Ellene Route in 2019 however his urine drug screen was positive for cocaine at that time.  He states that he still has some weakness in his hands and his walking is still extremely challenging to which he mainly uses a wheelchair as he cannot walk.  He also complains of tingling in his legs bilaterally.  He states he has not walked since 2020.  He denies any bowel or bladder changes.  He does have some sensory changes to light touch in his legs which has been ongoing for at least 8 months.  He denies saddle anesthesia.  History reviewed.  Pertinent medical history as listed above History reviewed.  Pertinent surgical history as listed above  Medications: I have reviewed the patient's current medications. Allergies: No Known Allergies  History reviewed. No pertinent family history. Social History: Polysubstance abuse as listed above.  ROS: 14 point review of systems was obtained which all pertinent positives and negatives are listed in the HPI above  Physical Exam:  Vital signs in last 24 hours: Temp:  [98 F (36.7 C)-98.3 F (36.8 C)] 98 F (36.7 C) (07/25 1814) Pulse Rate:  [58-128] 65 (07/26 0746) Resp:  [11-18] 14 (07/26 0217) BP: (138-182)/(65-125) 153/88 (07/26 0700) SpO2:  [91 %-98 %] 96 % (07/26 0746) PE: Awake alert oriented x3 Pupils equally round reactive light Face symmetric Bilateral  interossei wasting, Bilateral upper extremities 4 out of 5 strength Bilateral lower extremities 4 out of 5 strength No clonus Positive Hoffmann's bilateral Sensory light touch slightly decreased in lower extremities bilaterally  Impression/Assessment:  69 year old male with 1.  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  Seizure disorder 4.  Polysubstance abuse, UDS pos for cocaine this admission  Plan:  -I will discuss surgical intervention with the primary medical team, he has chronic myelopathy and has multiple medical comorbidities.  At this time he is on aspirin which would would need to be held for at least 5 days prior to surgery. -He will need medical and cardiac clearance prior to any intervention., he does have a poor EF -His seizures appear to be controlled at this time as neurology is signing off -would need cervical decompression and fusion initially, followed by thoracic after recovery due to stenosis in multiple regions   Thank you for allowing me to participate in this patient's care.  Please do not hesitate to call with questions or concerns.   Elwin Sleight, [facility] Cell: [phone]

## 2020-05-05 NOTE — Progress Notes (Signed)
OT Cancellation Note  Patient Details Name: Tim Bradley MRN: [id number] DOB: [birth year]   Cancelled Treatment:    Reason Eval/Treat Not Completed: Other (comment) (Pt scheduled to have MRI due to increased numbness in BUEs. OT will hold until C/T spine cleared.)  Zenovia Jarred, MSOT, OTR/L [place] [facility] Office Number: [phone] Pager: [phone]  Zenovia Jarred 05/05/2020, 9:07 AM

## 2020-05-05 NOTE — Progress Notes (Signed)
Progress Note  Patient Name: Tim Bradley Date of Encounter: 05/05/2020  Primary Cardiologist: Jenkins Rouge, MD   Subjective   No SOB or chest pain. Legs still numb, neurosurgery to consult.  Inpatient Medications    Scheduled Meds: . aspirin EC  81 mg Oral Daily  . carvedilol  3.125 mg Oral BID WC  . divalproex  500 mg Oral Q12H  . folic acid  1 mg Oral Daily  . insulin aspart  0-5 Units Subcutaneous QHS  . insulin aspart  0-9 Units Subcutaneous TID WC  . isosorbide-hydrALAZINE  1 tablet Oral TID  . multivitamin with minerals  1 tablet Oral Daily  . sodium chloride flush  3 mL Intravenous Q12H  . tamsulosin  0.4 mg Oral QPC supper  . thiamine  100 mg Oral Daily   Or  . thiamine  100 mg Intravenous Daily   Continuous Infusions: . sodium chloride    . levETIRAcetam 500 mg (05/05/20 0859)   PRN Meds: sodium chloride, acetaminophen, LORazepam **OR** LORazepam, sodium chloride flush   Vital Signs    Vitals:   05/05/20 0030 05/05/20 0434 05/05/20 0851 05/05/20 0900  BP: 122/74 (!) 109/58 121/64   Pulse: 72 67 74 75  Resp: 18 18 19    Temp: 98.2 F (36.8 C) 98 F (36.7 C) 98.5 F (36.9 C)   TempSrc: Axillary Axillary Oral   SpO2: 91% 91% 94%   Weight:  95.1 kg    Height:        Intake/Output Summary (Last 24 hours) at 05/05/2020 1021 Last data filed at 05/05/2020 0851 Gross per 24 hour  Intake 720 ml  Output 1200 ml  Net -480 ml   Filed Weights   05/03/20 0653 05/04/20 0500 05/05/20 0434  Weight: 94.1 kg 93 kg 95.1 kg    Telemetry    SR, LBBB - Personally Reviewed  ECG    SR LBBB QRS 154 ms - Personally Reviewed  Physical Exam   GEN: No acute distress.   Neck: No JVD Cardiac: regular rhythm, normal rate, no murmurs, rubs, or gallops.  Respiratory: Clear to auscultation bilaterally. GI: Soft, nontender, non-distended  MS: No edema; No deformity. Neuro:  Nonfocal  Psych: Normal affect, a+o x 3, resting.   Labs    Chemistry Recent Labs  Lab  05/02/20 1528 05/02/20 1528 05/03/20 0441 05/04/20 0516 05/05/20 0026  NA 138   < > 140 141 140  K 4.4   < > 3.9 4.0 3.9  CL 105   < > 102 103 103  CO2 25   < > 27 27 25   GLUCOSE 100*   < > 152* 101* 87  BUN 56*   < > 54* 57* 60*  CREATININE 2.61*   < > 2.75* 3.01* 2.90*  CALCIUM 8.7*   < > 8.7* 8.6* 8.5*  PROT 5.9*  --   --   --   --   ALBUMIN 3.1*  --   --   --   --   AST 33  --   --   --   --   ALT 20  --   --   --   --   ALKPHOS 59  --   --   --   --   BILITOT 0.5  --   --   --   --   GFRNONAA 24*   < > 22* 20* 21*  GFRAA 27*   < > 26* 23* 24*  ANIONGAP 8   < > 11 11 12    < > = values in this interval not displayed.     Hematology Recent Labs  Lab 05/02/20 1528  WBC 6.0  RBC 4.18*  HGB 11.9*  HCT 38.2*  MCV 91.4  MCH 28.5  MCHC 31.2  RDW 13.0  PLT 262    Cardiac EnzymesNo results for input(s): TROPONINI in the last 168 hours. No results for input(s): TROPIPOC in the last 168 hours.   BNP Recent Labs  Lab 05/02/20 1528  BNP 740.3*     DDimer No results for input(s): DDIMER in the last 168 hours.   Radiology    MR BRAIN WO CONTRAST  Result Date: 05/03/2020 CLINICAL DATA:  Seizure, abnormal neuro exam. EXAM: MRI HEAD WITHOUT CONTRAST TECHNIQUE: Multiplanar, multiecho pulse sequences of the brain and surrounding structures were obtained without intravenous contrast. COMPARISON:  Head CT 05/02/2020, brain MRI 07/21/2019. FINDINGS: Brain: The examination is intermittently motion degraded. Most notably, there is mild motion degradation of the axial T2/FLAIR sequence, mild motion degradation of the axial T1 weighted sequence, mild motion degradation of the coronal and coronal T2/FLAIR sequences oriented perpendicular to the long axis of the hippocampi and mild motion degradation of the coronal T2 weighted whole brain sequence. Mild generalized parenchymal atrophy. Moderate scattered T2/FLAIR hyperintensity within the cerebral white matter is nonspecific, but  consistent with chronic small vessel ischemic disease. These findings are stable as compared to the prior MRI of 07/21/2019. The hippocampi are symmetric in size and signal. There is no acute infarct. No evidence of intracranial mass. No chronic intracranial blood products. No extra-axial fluid collection. No midline shift. Vascular: Expected proximal arterial flow voids. Skull and upper cervical spine: No focal marrow lesion. Susceptibility artifact from cervical spinal fusion hardware. Sinuses/Orbits: Visualized orbits show no acute finding. Mild ethmoid sinus mucosal thickening at the imaged levels. No significant mastoid effusion. IMPRESSION: Motion degraded examination as described. No evidence of acute intracranial abnormality. No specific seizure focus is identified. Mild generalized parenchymal atrophy and moderate cerebral white matter chronic small vessel ischemic changes, stable as compared to the MRI of 07/21/2019. Mild ethmoid sinus mucosal thickening. Electronically Signed   By: Kellie Simmering DO   On: 05/03/2020 20:09   MR CERVICAL SPINE WO CONTRAST  Result Date: 05/04/2020 CLINICAL DATA:  Acute or progressive myelopathy. History of cervical fusion. EXAM: MRI CERVICAL SPINE WITHOUT CONTRAST TECHNIQUE: Multiplanar, multisequence MR imaging of the cervical spine was performed. No intravenous contrast was administered. COMPARISON:  MRI 08/31/2018.  Cervical radiographs 12/24/2017 FINDINGS: Alignment: Mild retrolisthesis C5-6.  Mild anterolisthesis C7-T1. Vertebrae: Negative for fracture or mass. Cord: Suboptimal cord evaluation. Image quality limited by body habitus and patient positioning and motion. Possible mild cord hyperintensity below the C5-6 disc space. Posterior Fossa, vertebral arteries, paraspinal tissues: Negative Disc levels: C2-3: Disc degeneration with diffuse uncinate spurring. Bilateral facet hypertrophy. Moderate foraminal stenosis bilaterally and mild spinal stenosis. Mild progression  of spinal stenosis since the prior study. C3-4: ACDF with anterior plate and solid fusion. Mild foraminal narrowing bilaterally due to spurring. Spinal canal adequate in size. C4-5: Solid interbody fusion. No plate is present at this time. There is mild foraminal narrowing bilaterally due to spurring. No interval change. C5-6: Advanced disc degeneration and spondylosis with diffuse uncinate spurring. There is severe spinal stenosis with compression of the cord. This has progressed since the prior study. There is severe foraminal encroachment bilaterally due to spurring. C6-7: Solid interbody fusion. Severe left foraminal  encroachment due to large osteophyte. No interval change. C7-T1: Mild anterolisthesis. Moderately large central disc protrusion with upgoing disc material. Mild progression. Bilateral facet hypertrophy. Severe spinal stenosis and cord compression has progressed. Severe foraminal stenosis bilaterally with progression. T1-2: Disc degeneration with diffuse uncinate spurring. Mild spinal stenosis. Moderate foraminal stenosis bilaterally. No interval change. IMPRESSION: 1. Moderate foraminal stenosis bilaterally at C2-3 with mild spinal stenosis. Mild progression 2. ACDF C3-4 with mild foraminal narrowing bilaterally 3. Interbody fusion C4-5 with mild foraminal narrowing bilaterally. 4. Severe spinal stenosis with cord compression at C5-6. This has progressed since the prior study. Severe foraminal encroachment bilaterally due to spurring 5. Solid interbody fusion C6-7 with severe left foraminal encroachment unchanged. 6. Moderately large central disc protrusion at C7-T1 with progression. Severe spinal stenosis and cord compression. 7. These results were called by telephone at the time of interpretation on 05/04/2020 at 5:37 pm to provider [facility] , who verbally acknowledged these results. Electronically Signed   By: Franchot Gallo M.D.   On: 05/04/2020 17:39   MR THORACIC SPINE WO  CONTRAST  Result Date: 05/04/2020 CLINICAL DATA:  Myelopathy, acute or progressive. EXAM: MRI THORACIC SPINE WITHOUT CONTRAST TECHNIQUE: Multiplanar, multisequence MR imaging of the thoracic spine was performed. No intravenous contrast was administered. COMPARISON:  MRI cervical spine 05/04/2020.  Lumbar  MRI 11/27/2017 FINDINGS: Alignment: Image quality degraded by motion. The final axial gradient echo sequence was not possible as the patient prematurely terminated the exam. Anterolisthesis C7-T1 approximately 2-3 mm. Remainder of the thoracic alignment is normal. Vertebrae: Chronic compression fracture T12 vertebral body. No acute thoracic fracture or mass. Cord: Limited cord evaluation due to motion. Possible cord signal abnormality at T10-11 on the sagittal IR sequence. There is associated spinal stenosis at this level which is moderate to severe. Paraspinal and other soft tissues: Negative for paraspinous mass or fluid. No soft tissue edema. Disc levels: C7-T1: Central disc protrusion with diffuse endplate spurring. 2- 3 mm anterolisthesis with severe facet degeneration. Moderate to severe spinal stenosis with cord compression. Severe foraminal could bilaterally. This was also seen on the cervical spine MRI earlier today. T1-2: Disc degeneration and diffuse endplate spurring. Bilateral facet hypertrophy. Cord flattening with mild spinal stenosis. Moderate to severe foraminal encroachment bilaterally due to spurring. T2-3: Disc degeneration with diffuse endplate spurring. Mild foraminal narrowing bilaterally. T3-4: Disc degeneration and diffuse endplate spurring. Right-sided disc/osteophyte moderate right foraminal stenosis. Mild left foraminal stenosis. Mild spinal stenosis. T4-5: Disc degeneration with diffuse endplate spurring. Mild foraminal narrowing bilaterally. T5-6: Disc degeneration with diffuse endplate spurring. Left-sided disc/osteophyte complex with moderate left foraminal encroachment. Mild spinal  stenosis. T6-7: Small central disc protrusion. Bilateral facet hypertrophy. Mild foraminal narrowing bilaterally. T7-8: Small central and left-sided disc protrusion. There is associated spurring on the left with moderate to severe left foraminal encroachment. Bilateral facet hypertrophy and mild spinal stenosis. T8-9: Small central disc protrusion. Mild spinal stenosis and moderate foraminal stenosis bilaterally. T9-10: Disc degeneration with central and left-sided disc and osteophyte complex. Bilateral facet hypertrophy. Moderate spinal stenosis. Severe left foraminal stenosis. T10-11: Broad-based disc protrusion and spurring with severe facet degeneration. Moderate to severe spinal stenosis and severe foraminal encroachment bilaterally. Possible cord signal hyperintensity. T11-12: Disc degeneration with endplate spurring and moderate foraminal encroachment bilaterally. T12-L1: Marked facet hypertrophy bilaterally. Mild spinal stenosis and mild foraminal stenosis bilaterally. IMPRESSION: Chronic compression fracture T12.  No acute fracture Image quality degraded by motion. Moderate to severe spinal stenosis at C7-T1 with cord compression. Severe foraminal encroachment  bilaterally. Moderate to severe spinal stenosis and cord compression at T10-11 with possible cord signal abnormality. Severe foraminal encroachment bilaterally Diffuse disc and facet degeneration throughout the thoracic spine as described above Electronically Signed   By: Franchot Gallo M.D.   On: 05/04/2020 18:31    Cardiac Studies   IMPRESSIONS   1. Left ventricular ejection fraction, by estimation, is <20%. The left  ventricle has severely decreased function. The left ventricle demonstrates  global hypokinesis. There is moderate concentric left ventricular  hypertrophy. Left ventricular diastolic  parameters are consistent with Grade I diastolic dysfunction (impaired  relaxation).  2. Right ventricular systolic function is mildly  reduced. The right  ventricular size is normal. There is normal pulmonary artery systolic  pressure.  3. Left atrial size was mildly dilated.  4. The mitral valve is normal in structure. Trivial mitral valve  regurgitation. No evidence of mitral stenosis.  5. The aortic valve is tricuspid. Aortic valve regurgitation is not  visualized. No aortic stenosis is present.  6. Aortic dilatation noted. Aneurysm of the ascending aorta, measuring 41  mm. There is mild dilatation at the level of the sinuses of Valsalva  measuring 37 mm.  7. The inferior vena cava is dilated in size with >50% respiratory  variability, suggesting right atrial pressure of 8 mmHg.   Patient Profile     69 y.o. male with a hx of hypertension, seizure disorder, DM 2, polysubstance abuse (tobacco, alcohol and cocaine), hypertension, TAAand recently diagnosed systolic heart failure.   Assessment & Plan   Principal Problem:   Acute on chronic congestive heart failure (HCC) Active Problems:   Generalized weakness   Acute encephalopathy   Diarrhea   QT prolongation   Pressure injury of skin   Chronic systolic CHF - continue coreg and bidil. Appears euvolemic and comfortable, hold diuresis today. Cr remains abnormal and elevated from baseline (recently baseline of 1.9-2.1).      For questions or updates, please contact [place] Please consult [email or web address] for contact info under        Signed, Elouise Munroe, MD  05/05/2020, 10:21 AM

## 2020-05-05 NOTE — Evaluation (Signed)
Occupational Therapy Evaluation Patient Details Name: Tim Bradley MRN: [id number] DOB: [birth year] Today's Date: 05/05/2020    History of Present Illness Pt is a 69 y.o. M with significant PMH of polysubstance abuse, combined systolic and diastolic heart failure, hypertension, type 2 diabetes mellitus, CKD, seizure disorder who presents with seizure like activity and AMS. CT head unremarkable for acute findings, small amount of hyperdensity in left frontal lobe concerning for [facility] - also present on old SAH. UDS positive for cocaine.    Clinical Impression   PTA pt living with girlfriend and using w/c for mobility. He reports requiring assist for BADLs but "does not always have the help". Per chart review pt is in poor living conditions. At time of eval, pt is very fatigued with limited engagement. Per chart review, pt had seizure previous date on 8/6. He was able to complete bed mobility with mod A, but fatigue limiting further progression. Noted cognitive deficits in orientation, awareness and problem solving. Pt is endorsing Bil hand numbness/weakness. Noted pt had MRI of T/Cspine- per RN pt has no specific mobility restrictions. Given current status and poor living environment, recommend SNF at d/c to progress pt safely prior to d/c home. Will continue to follow per POC listed below.    Follow Up Recommendations  SNF;Supervision/Assistance - 24 hour    Equipment Recommendations  None recommended by OT    Recommendations for Other Services       Precautions / Restrictions Precautions Precautions: Fall Restrictions Weight Bearing Restrictions: No      Mobility Bed Mobility Overal bed mobility: Needs Assistance Bed Mobility: Supine to Sit;Sit to Supine     Supine to sit: Mod assist Sit to supine: Mod assist   General bed mobility comments: ModA to pull up to sit and for BLE assist back into bed  Transfers                      Balance Overall balance assessment: Needs  assistance Sitting-balance support: Bilateral upper extremity supported;Feet supported Sitting balance-Leahy Scale: Poor Sitting balance - Comments: Reliant on BUE support, supervision for safety                                   ADL either performed or assessed with clinical judgement   ADL Overall ADL's : Needs assistance/impaired Eating/Feeding: Set up;Sitting   Grooming: Set up;Sitting   Upper Body Bathing: Minimal assistance;Sitting   Lower Body Bathing: Maximal assistance;Sitting/lateral leans;Sit to/from stand   Upper Body Dressing : Minimal assistance;Sitting   Lower Body Dressing: Maximal assistance;Sitting/lateral leans;Sit to/from stand   Toilet Transfer: Maximal assistance;Squat-pivot;Requires drop arm   Toileting- Clothing Manipulation and Hygiene: Total assistance         General ADL Comments: pt now on seizure precautions, had seizure 8/6     Vision Baseline Vision/History: No visual deficits Patient Visual Report: No change from baseline Vision Assessment?: No apparent visual deficits     Perception     Praxis      Pertinent Vitals/Pain Pain Assessment: Faces Faces Pain Scale: Hurts little more Pain Location: back Pain Descriptors / Indicators: Aching;Grimacing;Guarding Pain Intervention(s): Monitored during session     Hand Dominance Left   Extremity/Trunk Assessment Upper Extremity Assessment Upper Extremity Assessment: Generalized weakness;RUE deficits/detail;LUE deficits/detail RUE Deficits / Details: reports hand to be numb near 24/7. poor grip strength noted RUE Coordination: decreased fine motor;decreased gross motor LUE  Deficits / Details: reports hand to be numb near 24/7. poor grip strength noted   Lower Extremity Assessment Lower Extremity Assessment: Defer to PT evaluation       Communication Communication Communication: No difficulties   Cognition Arousal/Alertness: Awake/alert Behavior During Therapy: WFL  for tasks assessed/performed Overall Cognitive Status: No family/caregiver present to determine baseline cognitive functioning Area of Impairment: Orientation;Awareness;Problem solving                 Orientation Level: Disoriented to;Time         Awareness: Emergent Problem Solving: Difficulty sequencing;Requires verbal cues;Requires tactile cues;Slow processing General Comments: frequently falling asleep in session, disoriented. Requires increased cues and time to process basic tasks   General Comments       Exercises     Shoulder Instructions      Home Living Family/patient expects to be discharged to:: Private residence Living Arrangements: Spouse/significant other Available Help at Discharge: Friend(s);Available PRN/intermittently Type of Home: House Home Access: Stairs to enter [employer] of Steps: 2 Entrance Stairs-Rails: None Home Layout: One level     Bathroom Shower/Tub: Tub/shower unit         Home Equipment: Environmental consultant - 2 wheels;Cane - single point;Wheelchair - manual   Additional Comments: Per chart review, poor living conditions. Pt often sleeps on floor      Prior Functioning/Environment Level of Independence: Independent with assistive device(s)        Comments: Uses wheelchair, recurrent falls        OT Problem List: Decreased strength;Decreased activity tolerance;Impaired balance (sitting and/or standing);Decreased coordination;Decreased knowledge of use of DME or AE;Pain;Decreased safety awareness;Decreased cognition      OT Treatment/Interventions: Self-care/ADL training;Therapeutic exercise;Energy conservation;DME and/or AE instruction;Therapeutic activities;Patient/family education;Balance training    OT Goals(Current goals can be found in the care plan section) Acute Rehab OT Goals Patient Stated Goal: to walk again OT Goal Formulation: With patient Time For Goal Achievement: 05/19/20 Potential to Achieve Goals: Good   OT Frequency: Min 2X/week   Barriers to D/C: Decreased caregiver support  per chart review- poor living conditions       Co-evaluation              AM-PAC OT "6 Clicks" Daily Activity     Outcome Measure Help from another person eating meals?: A Little Help from another person taking care of personal grooming?: A Little Help from another person toileting, which includes using toliet, bedpan, or urinal?: A Lot Help from another person bathing (including washing, rinsing, drying)?: A Lot Help from another person to put on and taking off regular upper body clothing?: A Lot Help from another person to put on and taking off regular lower body clothing?: Total 6 Click Score: 13   End of Session Equipment Utilized During Treatment: Gait belt Nurse Communication: Mobility status  Activity Tolerance: Patient limited by fatigue Patient left: in bed;with call bell/phone within reach;with bed alarm set  OT Visit Diagnosis: Unsteadiness on feet (R26.81);Other abnormalities of gait and mobility (R26.89);Muscle weakness (generalized) (M62.81);Repeated falls (R29.6)                Time: 4193-7902 OT Time Calculation (min): 11 min Charges:  OT General Charges $OT Visit: 1 Visit OT Evaluation $OT Eval Moderate Complexity: [place], MSOT, OTR/L Acute Rehabilitation Services [facility] Office Number: [phone] Pager: [phone]  Zenovia Jarred 05/05/2020, 6:30 PM

## 2020-05-05 NOTE — Progress Notes (Addendum)
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  He was admitted on July 2021 for acute decompensated CHF and he left AMA at that time.  He was confused on presentation.  She was suspected to have seizures, so neurology consulted.  His mental status improved and he became alert and oriented after presentation to the emergency department but he was frequently somnolent and intermittently falling asleep during the conversation.    Patient reported seizures at home.  He was not taking any medications and was just taking cocaine. Lab results showed elevated BNP.UDS was positive for cocaine. Started on Keppra, EEG ordered.  Cardiology and neurology are also following.  05/04/2020: Patient seen alongside patient's friend.  Patient had another seizure today.  Neurology input is highly appreciated.  Neurology team is managing patient's seizure medication.  MRI of the cervical and thoracic spine is planned for today.  No alcohol withdrawal symptoms reported to date.   05/05/2020: No further seizures reported.  Cervical and thoracic MRI revealed cervical stenosis and thoracic stenosis.  Neurosurgical team consulted.  Neurosurgery input is appreciated.  On further questioning, patient reports that the weakness of the lower extremities and the numbness have been going on for about a year.  As per neurosurgery team, patient has to be optimized prior to any surgical procedure.  Assessment & Plan:   Principal Problem:   Acute on chronic  congestive heart failure (HCC) Active Problems:   Generalized weakness   Acute encephalopathy   Diarrhea   QT prolongation   Pressure injury of skin   Acute exacerbation of chronic combined systolic/diastolic congestive heart failure: Echo on 04/11/2020 showed ejection fraction of less than 53%, grade 1 diastolic dysfunction.  He appeared volume overloaded on presentation with significant peripheral edema.  He was not hypoxic though.  Elevated BNP at 740.  Chest x-ray showed cardiomegaly with mild central vascular congestion.  Started on Lasix 40 mg IV twice a day.  Continue monitoring input/output, daily weight.  He is on Coreg and BiDil at home which we will resume. Hheart failure team also follow him here. 05/05/2020: CHF seems compensated present.  Diarrhea: No fever, leukocytosis or signs of sepsis.  Lactate is normal.  C. difficile/GI pathogen panel have been ordered. But he doesn't have any bowel movements.Will dc precautions 05/05/2020: No diarrhea is reported.  Acute encephalopathy/suspected seizure episodes: Confusion on presentation but he was completely alert and oriented during my evaluation today.  Ammonia level normal.  There was suspicion of a small subdural hemorrhage in the CT head but after being reviewed by neurology, this is less likely.  Patient reports history of frequent falls.  Patient is noncompliant with medications and has not taken any seizure medications at home.  Started on Keppra.  EEG is showed mild diffuse encephalopathy, no seizures or epileptiform discharges.  MRI of the brain has been ordered by neurology.  Neurology following. 05/05/2020: Mentation remains stable.  No further seizures reported overnight.  QTC prolongation: Monitor electrolytes.  QTC this morning is 514.  Monitor on telemetry. 05/04/2020: Corrected QTc interval today is 466  ms.  Suspected UTI: UA was equivocal.  No fever, leukocytosis or signs of sepsis.  Urine culture has been ordered.  No  indication for antibiotic therapy. 05/04/2020: Final urine culture result is still pending.  Chronic alcohol abuse: Monitor CIWA.  Continue thiamine, folate, multivitamin.  He says he drinks vodka 05/04/2020: Patient denies being an alcoholic!  CKD stage III-4: Creatinine in the range of 2.  Currently stable at baseline. 05/04/2020: Follow-up with nephrology on discharge.  Substance abuse: UDS positive for cocaine.  Family counseled for cessation.  Hypertension: Currently normotensive.  On Coreg and BiDil at home which we will resume  Diet-controlled type 2 diabetes mellitus: Hemoglobin C 5.9 as per 7/21.  Continue sliding scale insulin  BPH: On Flomax  Poor living condition/generalized weakness: Patient states he does not have any family members.  He cannot walk due to back pain, lower extremity pain.  PT and OT were consulted and he has been recommended skilled facility on discharge.  Social worker following.  Patient states he is interested to go home when clinically  Ready.  Cervical/thoracic spine stenosis/lower extremity myelopathy and numbness: -See above documentation. -For possible surgery after medical optimization. -Neurosurgery input is appreciated.  DVT prophylaxis: SCD Code Status: Full Family Communication: None at the bedside Status is: Inpatient  Remains inpatient appropriate because:Ongoing diagnostic testing needed not appropriate for outpatient work up   Dispo: The patient is from: Home              Anticipated d/c is to: SNF              Anticipated d/c date is: 3 days              Patient currently is not medically stable to d/c.   Consultants:   Procedures:None  Antimicrobials:  Anti-infectives (From admission, onward)   None      Subjective: Patient seen No new complaints today.    Objective: Vitals:   05/05/20 0851 05/05/20 0900 05/05/20 1603 05/05/20 1704  BP: 121/64  106/80 (!) 111/56  Pulse: 74 75 68 76  Resp: 19     Temp: 98.5 F (36.9  C)  98.5 F (36.9 C)   TempSrc: Oral  Oral   SpO2: 94%  97%   Weight:      Height:        Intake/Output Summary (Last 24 hours) at 05/05/2020 1915 Last data filed at 05/05/2020 1100 Gross per 24 hour  Intake 360 ml  Output 1025 ml  Net -665 ml   Filed Weights   05/03/20 0653 05/04/20 0500 05/05/20 0434  Weight: 94.1 kg 93 kg 95.1 kg    Examination: General exam: Not in any distress. Awake and alert. HEENT: Pallor. No jaundice. Respiratory system: Clear to auscultation. Cardiovascular system: S1 & S2 heard, RRR. No JVD, murmurs, rubs, gallops or clicks. No pedal edema. Gastrointestinal system: Abdomen is nondistended, soft and nontender. No organomegaly or masses felt. Normal bowel sounds heard. Central nervous system: Alert and oriented. No focal neurological deficits. Extremities: Trace bilateral lower extremity edema.  Data Reviewed: I have personally reviewed following labs and imaging studies  CBC: Recent Labs  Lab 05/02/20 1528  WBC 6.0  NEUTROABS 3.6  HGB 11.9*  HCT 38.2*  MCV 91.4  PLT 417   Basic Metabolic Panel: Recent Labs  Lab 05/02/20 1528 05/02/20 2025 05/03/20 0441 05/04/20 0516 05/05/20 0026  NA 138  --  140 141 140  K 4.4  --  3.9 4.0  3.9  CL 105  --  102 103 103  CO2 25  --  27 27 25   GLUCOSE 100*  --  152* 101* 87  BUN 56*  --  54* 57* 60*  CREATININE 2.61*  --  2.75* 3.01* 2.90*  CALCIUM 8.7*  --  8.7* 8.6* 8.5*  MG  --  2.4  --   --   --   PHOS  --   --  4.2  --   --    GFR: Estimated Creatinine Clearance: 27.2 mL/min (A) (by C-G formula based on SCr of 2.9 mg/dL (H)). Liver Function Tests: Recent Labs  Lab 05/02/20 1528  AST 33  ALT 20  ALKPHOS 59  BILITOT 0.5  PROT 5.9*  ALBUMIN 3.1*   Recent Labs  Lab 05/02/20 1528  LIPASE 25   Recent Labs  Lab 05/02/20 1855  AMMONIA 32   Coagulation Profile: No results for input(s): INR, PROTIME in the last 168 hours. Cardiac Enzymes: No results for input(s): CKTOTAL, CKMB,  CKMBINDEX, TROPONINI in the last 168 hours. BNP (last 3 results) No results for input(s): PROBNP in the last 8760 hours. HbA1C: No results for input(s): HGBA1C in the last 72 hours. CBG: Recent Labs  Lab 05/04/20 1823 05/04/20 2132 05/05/20 0848 05/05/20 1210 05/05/20 1649  GLUCAP 96 130* 92 84 129*   Lipid Profile: No results for input(s): CHOL, HDL, LDLCALC, TRIG, CHOLHDL, LDLDIRECT in the last 72 hours. Thyroid Function Tests: No results for input(s): TSH, T4TOTAL, FREET4, T3FREE, THYROIDAB in the last 72 hours. Anemia Panel: No results for input(s): VITAMINB12, FOLATE, FERRITIN, TIBC, IRON, RETICCTPCT in the last 72 hours. Sepsis Labs: Recent Labs  Lab 05/02/20 1528  LATICACIDVEN 1.4    Recent Results (from the past 240 hour(s))  Culture, Urine     Status: None   Collection Time: 05/02/20  4:10 PM   Specimen: Urine, Random  Result Value Ref Range Status   Specimen Description URINE, RANDOM  Final   Special Requests NONE  Final   Culture   Final    NO GROWTH Performed at [facility], [address], [place], [place] [address]    Report Status 05/04/2020 FINAL  Final  SARS Coronavirus 2 by RT PCR (hospital order, performed in [facility] hospital lab) Nasopharyngeal     Status: None   Collection Time: 05/02/20  4:23 PM   Specimen: Nasopharyngeal  Result Value Ref Range Status   SARS Coronavirus 2 NEGATIVE NEGATIVE Final    Comment: (NOTE) SARS-CoV-2 target nucleic acids are NOT DETECTED.  The SARS-CoV-2 RNA is generally detectable in upper and lower respiratory specimens during the acute phase of infection. The lowest concentration of SARS-CoV-2 viral copies this assay can detect is 250 copies / mL. A negative result does not preclude SARS-CoV-2 infection and should not be used as the sole basis for treatment or other patient management decisions.  A negative result may occur with improper specimen collection / handling, submission of specimen  other than nasopharyngeal swab, presence of viral mutation(s) within the areas targeted by this assay, and inadequate number of viral copies (<250 copies / mL). A negative result must be combined with clinical observations, patient history, and epidemiological information.  Fact Sheet for Patients:   StrictlyIdeas.no  Fact Sheet for Healthcare Providers: BankingDealers.co.za  This test is not yet approved or  cleared by the [place] FDA and has been authorized for detection and/or diagnosis of SARS-CoV-2 by FDA under an Emergency  Use Authorization (EUA).  This EUA will remain in effect (meaning this test can be used) for the duration of the COVID-19 declaration under Section 564(b)(1) of the Act, 21 U.S.C. section 360bbb-3(b)(1), unless the authorization is terminated or revoked sooner.  Performed at [facility], [address] [place], [place] [address]          Radiology Studies: MR BRAIN WO CONTRAST  Result Date: 05/03/2020 CLINICAL DATA:  Seizure, abnormal neuro exam. EXAM: MRI HEAD WITHOUT CONTRAST TECHNIQUE: Multiplanar, multiecho pulse sequences of the brain and surrounding structures were obtained without intravenous contrast. COMPARISON:  Head CT 05/02/2020, brain MRI 07/21/2019. FINDINGS: Brain: The examination is intermittently motion degraded. Most notably, there is mild motion degradation of the axial T2/FLAIR sequence, mild motion degradation of the axial T1 weighted sequence, mild motion degradation of the coronal and coronal T2/FLAIR sequences oriented perpendicular to the long axis of the hippocampi and mild motion degradation of the coronal T2 weighted whole brain sequence. Mild generalized parenchymal atrophy. Moderate scattered T2/FLAIR hyperintensity within the cerebral white matter is nonspecific, but consistent with chronic small vessel ischemic disease. These findings are stable as compared to the prior  MRI of 07/21/2019. The hippocampi are symmetric in size and signal. There is no acute infarct. No evidence of intracranial mass. No chronic intracranial blood products. No extra-axial fluid collection. No midline shift. Vascular: Expected proximal arterial flow voids. Skull and upper cervical spine: No focal marrow lesion. Susceptibility artifact from cervical spinal fusion hardware. Sinuses/Orbits: Visualized orbits show no acute finding. Mild ethmoid sinus mucosal thickening at the imaged levels. No significant mastoid effusion. IMPRESSION: Motion degraded examination as described. No evidence of acute intracranial abnormality. No specific seizure focus is identified. Mild generalized parenchymal atrophy and moderate cerebral white matter chronic small vessel ischemic changes, stable as compared to the MRI of 07/21/2019. Mild ethmoid sinus mucosal thickening. Electronically Signed   By: Kellie Simmering DO   On: 05/03/2020 20:09   MR CERVICAL SPINE WO CONTRAST  Result Date: 05/04/2020 CLINICAL DATA:  Acute or progressive myelopathy. History of cervical fusion. EXAM: MRI CERVICAL SPINE WITHOUT CONTRAST TECHNIQUE: Multiplanar, multisequence MR imaging of the cervical spine was performed. No intravenous contrast was administered. COMPARISON:  MRI 08/31/2018.  Cervical radiographs 12/24/2017 FINDINGS: Alignment: Mild retrolisthesis C5-6.  Mild anterolisthesis C7-T1. Vertebrae: Negative for fracture or mass. Cord: Suboptimal cord evaluation. Image quality limited by body habitus and patient positioning and motion. Possible mild cord hyperintensity below the C5-6 disc space. Posterior Fossa, vertebral arteries, paraspinal tissues: Negative Disc levels: C2-3: Disc degeneration with diffuse uncinate spurring. Bilateral facet hypertrophy. Moderate foraminal stenosis bilaterally and mild spinal stenosis. Mild progression of spinal stenosis since the prior study. C3-4: ACDF with anterior plate and solid fusion. Mild foraminal  narrowing bilaterally due to spurring. Spinal canal adequate in size. C4-5: Solid interbody fusion. No plate is present at this time. There is mild foraminal narrowing bilaterally due to spurring. No interval change. C5-6: Advanced disc degeneration and spondylosis with diffuse uncinate spurring. There is severe spinal stenosis with compression of the cord. This has progressed since the prior study. There is severe foraminal encroachment bilaterally due to spurring. C6-7: Solid interbody fusion. Severe left foraminal encroachment due to large osteophyte. No interval change. C7-T1: Mild anterolisthesis. Moderately large central disc protrusion with upgoing disc material. Mild progression. Bilateral facet hypertrophy. Severe spinal stenosis and cord compression has progressed. Severe foraminal stenosis bilaterally with progression. T1-2: Disc degeneration with diffuse uncinate spurring. Mild spinal stenosis.  Moderate foraminal stenosis bilaterally. No interval change. IMPRESSION: 1. Moderate foraminal stenosis bilaterally at C2-3 with mild spinal stenosis. Mild progression 2. ACDF C3-4 with mild foraminal narrowing bilaterally 3. Interbody fusion C4-5 with mild foraminal narrowing bilaterally. 4. Severe spinal stenosis with cord compression at C5-6. This has progressed since the prior study. Severe foraminal encroachment bilaterally due to spurring 5. Solid interbody fusion C6-7 with severe left foraminal encroachment unchanged. 6. Moderately large central disc protrusion at C7-T1 with progression. Severe spinal stenosis and cord compression. 7. These results were called by telephone at the time of interpretation on 05/04/2020 at 5:37 pm to provider MCNE[facility] , who verbally acknowledged these results. Electronically Signed   By: Franchot Gallo M.D.   On: 05/04/2020 17:39   MR THORACIC SPINE WO CONTRAST  Result Date: 05/04/2020 CLINICAL DATA:  Myelopathy, acute or progressive. EXAM: MRI THORACIC SPINE  WITHOUT CONTRAST TECHNIQUE: Multiplanar, multisequence MR imaging of the thoracic spine was performed. No intravenous contrast was administered. COMPARISON:  MRI cervical spine 05/04/2020.  Lumbar  MRI 11/27/2017 FINDINGS: Alignment: Image quality degraded by motion. The final axial gradient echo sequence was not possible as the patient prematurely terminated the exam. Anterolisthesis C7-T1 approximately 2-3 mm. Remainder of the thoracic alignment is normal. Vertebrae: Chronic compression fracture T12 vertebral body. No acute thoracic fracture or mass. Cord: Limited cord evaluation due to motion. Possible cord signal abnormality at T10-11 on the sagittal IR sequence. There is associated spinal stenosis at this level which is moderate to severe. Paraspinal and other soft tissues: Negative for paraspinous mass or fluid. No soft tissue edema. Disc levels: C7-T1: Central disc protrusion with diffuse endplate spurring. 2- 3 mm anterolisthesis with severe facet degeneration. Moderate to severe spinal stenosis with cord compression. Severe foraminal could bilaterally. This was also seen on the cervical spine MRI earlier today. T1-2: Disc degeneration and diffuse endplate spurring. Bilateral facet hypertrophy. Cord flattening with mild spinal stenosis. Moderate to severe foraminal encroachment bilaterally due to spurring. T2-3: Disc degeneration with diffuse endplate spurring. Mild foraminal narrowing bilaterally. T3-4: Disc degeneration and diffuse endplate spurring. Right-sided disc/osteophyte moderate right foraminal stenosis. Mild left foraminal stenosis. Mild spinal stenosis. T4-5: Disc degeneration with diffuse endplate spurring. Mild foraminal narrowing bilaterally. T5-6: Disc degeneration with diffuse endplate spurring. Left-sided disc/osteophyte complex with moderate left foraminal encroachment. Mild spinal stenosis. T6-7: Small central disc protrusion. Bilateral facet hypertrophy. Mild foraminal narrowing  bilaterally. T7-8: Small central and left-sided disc protrusion. There is associated spurring on the left with moderate to severe left foraminal encroachment. Bilateral facet hypertrophy and mild spinal stenosis. T8-9: Small central disc protrusion. Mild spinal stenosis and moderate foraminal stenosis bilaterally. T9-10: Disc degeneration with central and left-sided disc and osteophyte complex. Bilateral facet hypertrophy. Moderate spinal stenosis. Severe left foraminal stenosis. T10-11: Broad-based disc protrusion and spurring with severe facet degeneration. Moderate to severe spinal stenosis and severe foraminal encroachment bilaterally. Possible cord signal hyperintensity. T11-12: Disc degeneration with endplate spurring and moderate foraminal encroachment bilaterally. T12-L1: Marked facet hypertrophy bilaterally. Mild spinal stenosis and mild foraminal stenosis bilaterally. IMPRESSION: Chronic compression fracture T12.  No acute fracture Image quality degraded by motion. Moderate to severe spinal stenosis at C7-T1 with cord compression. Severe foraminal encroachment bilaterally. Moderate to severe spinal stenosis and cord compression at T10-11 with possible cord signal abnormality. Severe foraminal encroachment bilaterally Diffuse disc and facet degeneration throughout the thoracic spine as described above Electronically Signed   By: Franchot Gallo M.D.   On: 05/04/2020 18:31  Scheduled Meds: . aspirin EC  81 mg Oral Daily  . carvedilol  3.125 mg Oral BID WC  . divalproex  500 mg Oral Q12H  . folic acid  1 mg Oral Daily  . insulin aspart  0-5 Units Subcutaneous QHS  . insulin aspart  0-9 Units Subcutaneous TID WC  . isosorbide-hydrALAZINE  1 tablet Oral TID  . multivitamin with minerals  1 tablet Oral Daily  . sodium chloride flush  3 mL Intravenous Q12H  . tamsulosin  0.4 mg Oral QPC supper  . thiamine  100 mg Oral Daily   Or  . thiamine  100 mg Intravenous Daily   Continuous  Infusions: . sodium chloride    . levETIRAcetam 500 mg (05/05/20 0859)     LOS: 3 days   Time spent:25 mins.  Bonnell Public, MD [facility] P8/03/2020, 7:15 PM

## 2020-05-06 ENCOUNTER — Inpatient Hospital Stay (HOSPITAL_COMMUNITY): Payer: No Typology Code available for payment source

## 2020-05-06 DIAGNOSIS — M4804 Spinal stenosis, thoracic region: Secondary | ICD-10-CM

## 2020-05-06 DIAGNOSIS — G959 Disease of spinal cord, unspecified: Secondary | ICD-10-CM

## 2020-05-06 LAB — BASIC METABOLIC PANEL
Anion gap: 12 (ref 5–15)
BUN: 56 mg/dL — ABNORMAL HIGH (ref 8–23)
CO2: 25 mmol/L (ref 22–32)
Calcium: 8.7 mg/dL — ABNORMAL LOW (ref 8.9–10.3)
Chloride: 105 mmol/L (ref 98–111)
Creatinine, Ser: 2.7 mg/dL — ABNORMAL HIGH (ref 0.61–1.24)
GFR calc Af Amer: 26 mL/min — ABNORMAL LOW (ref >60)
GFR calc non Af Amer: 23 mL/min — ABNORMAL LOW (ref >60)
Glucose, Bld: 117 mg/dL — ABNORMAL HIGH (ref 70–99)
Potassium: 3.8 mmol/L (ref 3.5–5.1)
Sodium: 142 mmol/L (ref 135–145)

## 2020-05-06 LAB — GLUCOSE, CAPILLARY
Glucose-Capillary: 101 mg/dL — ABNORMAL HIGH (ref 70–99)
Glucose-Capillary: 107 mg/dL — ABNORMAL HIGH (ref 70–99)
Glucose-Capillary: 101 mg/dL — ABNORMAL HIGH (ref 70–99)
Glucose-Capillary: 100 mg/dL — ABNORMAL HIGH (ref 70–99)

## 2020-05-06 MED ORDER — LEVETIRACETAM 500 MG PO TABS
500.0000 mg | ORAL_TABLET | Freq: Two times a day (BID) | ORAL | Status: DC
  Administered 2020-05-06 – 2020-05-09 (×6): 500 mg via ORAL
  Filled 2020-05-06 (×6): qty 1

## 2020-05-06 NOTE — Plan of Care (Signed)
  Problem: Nutrition: Goal: Adequate nutrition will be maintained Outcome: Completed/Met

## 2020-05-06 NOTE — Progress Notes (Signed)
I have been asked to comment regarding optimization of seizure medications prior to surgery. Certainly, I would take care to keep his meds on the regular schedule and give IV if unable ot take PO due to NPO status, being in the recovery room, etc. Though I cannot guarantee seizure freedom in the peri-operative period, at this point, with no seizures since starting Depakote for several days, I do not think that further delay or changes to meds would give [place] significant further benefit and therefore have no objection to proceeding with surgery from a seizure point of view.   Roland Rack, MD [facility] [phone]  If 7pm- 7am, please page neurology on call as listed in [place].

## 2020-05-06 NOTE — Progress Notes (Signed)
   Providing Compassionate, Quality Care - Together  NEUROSURGERY PROGRESS NOTE   S: No issues overnight.   O: EXAM:  BP (!) 151/91   Pulse 72   Temp 98.4 F (36.9 C) (Oral)   Resp 15   Ht 6\' 2"  (1.88 m)   Wt 97.9 kg   SpO2 97%   BMI 27.72 kg/m   Awake alert oriented x3 Pupils equally round reactive light Face symmetric Bilateral interossei wasting, Bilateral upper extremities 4 out of 5 strength Bilateral lower extremities 4 out of 5 strength No clonus Positive Hoffmann's bilateral Sensory light touch slightly decreased in lower extremities bilaterally  ASSESSMENT:  69 y.o. male with  1.  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  Seizure disorder 4.  Polysubstance abuse, UDS pos for cocaine this admission  Plan:  -Patient currently planned to have a stress test tomorrow for cardiac evaluation for surgical intervention -I discussed with the patient the need for cervical and thoracic decompression due to his cord compression and severe myelopathy.  He would like to proceed when safe. -He will need to be off aspirin for 5 to 7 days prior to surgery. -Further recommendations once cardiac work-up completed. -Surgical decompression likely planned as an outpatient -We will continue to follow pending cardiac evaluation -We will obtain CT of the cervical spine for surgical planning   Thank you for allowing me to participate in this patient's care.  Please do not hesitate to call with questions or concerns.   Elwin Sleight, [facility] Cell: [phone]

## 2020-05-06 NOTE — Progress Notes (Signed)
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  Patient has also had weakness of the extremities and numbness of the lower extremities for over 1 year.  Apparently, patient was admitted in July 2021 for acute decompensated CHF and he left AMA at that time. Patient presented with diarrhea, bilateral lower extremity edema, confusion and suspected seizure.  Patient was admitted for further assessment and management.  Neurology team was consulted to assist patient management.  Patient is currently on Keppra 500 mg p.o. twice daily and Depakote 500 MG p.o. twice daily.  Patient has had one episode of seizure during the hospital stay, otherwise, has remained seizure-free.  Cardiology input is appreciated.  MRI of the cervical and thoracic spine revealed cervical spinal stenosis that is severe, impinging on the cord and moderate thoracic spinal stenosis.  Neurosurgery team has been consulted.  Neurosurgery has advised optimization by the neurology and cardiology team.  Neurology team is cleared patient for surgery.  Cardiology team plans nuclear stress test in the morning.  As per neurosurgery team, surgery will be done on outpatient basis.  Assessment & Plan:   Principal Problem:   Acute on chronic congestive heart failure (HCC) Active Problems:   Generalized weakness   Acute encephalopathy   Diarrhea   QT prolongation   Pressure injury of skin   Acute exacerbation of chronic combined systolic/diastolic congestive heart failure:  -Echo on 04/11/2020 showed ejection fraction of less than 20%, grade 1  diastolic dysfunction.   -He appeared volume overloaded on presentation with significant peripheral edema.   -Elevated BNP at 740.  Chest x-ray showed cardiomegaly with mild central vascular congestion.   -Cardiology team is managing.  Initially started on Lasix 40 mg IV twice a day, but currently not on diuretics.   -Continue to monitor input/output, daily weight.   -Patient is on Coreg and BiDil.   -CHF seems compensated.  Diarrhea:  05/05/2020: No diarrhea is reported.  Acute encephalopathy/suspected seizure episodes:  -This symptoms are resolved.   -Patient has remained stable.   -Ammonia level normal.  There was suspicion of a small subdural hemorrhage on the CT head but later deemed not likely after review by neurology.  Patient reports history of frequent falls.  Patient is noncompliant with medications and has not taken any seizure medications at home.   -Started on Keppra.   -EEG showed mild diffuse encephalopathy, no seizures or epileptiform discharges.   -MRI reveal any acute pathology.   -1 breakthrough seizures during hospital stay.  No seizure in the last 48 hours.    QTC prolongation:  -Resolved.  QTC of 466 ms on 05/04/2020.    Suspected UTI:  -UA was equivocal.   -Urine culture did not grow any organism.    Chronic alcohol abuse:  -No withdrawal symptoms reported.   -CIWA protocol.   -Continue thiamine, folate, multivitamin.    CKD stage III-4:  Creatinine in the range of 2.   Currently stable at baseline. 05/04/2020: Follow-up with nephrology on discharge.  Substance abuse: UDS positive for cocaine. Counseled.  Hypertension: Currently normotensive.  On Coreg and BiDil at home which we will resume  Diet-controlled type  2 diabetes mellitus: Hemoglobin C 5.9 as per 7/21.  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 -Neurosurgery team consulted.  Plan for outpatient surgery. -Neurology team has cleared patient for surgery. -For nuclear stress test by cardiology team tomorrow.    Poor living condition/generalized weakness: Patient states he does not have any family members.  He cannot walk due to back pain, lower extremity pain.  PT and OT were consulted and he has been recommended skilled facility on discharge.  Social worker following.  Patient states he is interested to go home when clinically  Ready.  DVT prophylaxis: SCD Code Status: Full Family Communication: None at the bedside Status is: Inpatient  Remains inpatient appropriate because:Ongoing diagnostic testing needed not appropriate for outpatient work up   Dispo: The patient is from: Home              Anticipated d/c is to: SNF              Anticipated d/c date is: 3 days              Patient currently is not medically stable to d/c.   Consultants:   Procedures:None  Antimicrobials:  Anti-infectives (From admission, onward)   None      Subjective: Patient seen No new complaints today.    Objective: Vitals:   05/06/20 0356 05/06/20 0901 05/06/20 1545 05/06/20 1749  BP: 126/63 (!) 151/91 (!) 141/80 135/76  Pulse: 80 72 79 82  Resp: 15  16   Temp: 98.4 F (36.9 C)  98.3 F (36.8 C)   TempSrc: Oral  Oral   SpO2: 97%  99%   Weight: 97.9 kg     Height:        Intake/Output Summary (Last 24 hours) at 05/06/2020 1808 Last data filed at 05/06/2020 1323 Gross per 24 hour  Intake 360 ml  Output 1600 ml  Net -1240 ml   Filed Weights   05/04/20 0500 05/05/20 0434 05/06/20 0356  Weight: 93 kg 95.1 kg 97.9 kg    Examination: General exam: Not in any distress. Awake and alert. HEENT: Pallor. No jaundice. Respiratory system: Clear to auscultation. Cardiovascular system: S1 & S2 heard, RRR. No JVD, murmurs, rubs, gallops or clicks. No pedal edema. Gastrointestinal system: Abdomen is nondistended,  soft and nontender. No organomegaly or masses felt. Normal bowel sounds heard. Central nervous system: Alert and oriented. No focal neurological deficits. Extremities: Trace bilateral lower extremity edema.  Data Reviewed: I have personally reviewed following labs and imaging studies  CBC: Recent Labs  Lab 05/02/20 1528  WBC 6.0  NEUTROABS 3.6  HGB 11.9*  HCT 38.2*  MCV 91.4  PLT 025   Basic Metabolic Panel: Recent Labs  Lab 05/02/20 1528 05/02/20 2025 05/03/20 0441 05/04/20 0516 05/05/20 0026 05/06/20 0557  NA 138  --  140 141 140 142  K 4.4  --  3.9 4.0 3.9 3.8  CL 105  --  102 103 103 105  CO2 25  --  27 27 25 25   GLUCOSE 100*  --  152* 101* 87 117*  BUN 56*  --  54* 57* 60* 56*  CREATININE 2.61*  --  2.75* 3.01* 2.90* 2.70*  CALCIUM 8.7*  --  8.7* 8.6* 8.5* 8.7*  MG  --  2.4  --   --   --   --  PHOS  --   --  4.2  --   --   --    GFR: Estimated Creatinine Clearance: 29.2 mL/min (A) (by C-G formula based on SCr of 2.7 mg/dL (H)). Liver Function Tests: Recent Labs  Lab 05/02/20 1528  AST 33  ALT 20  ALKPHOS 59  BILITOT 0.5  PROT 5.9*  ALBUMIN 3.1*   Recent Labs  Lab 05/02/20 1528  LIPASE 25   Recent Labs  Lab 05/02/20 1855  AMMONIA 32   Coagulation Profile: No results for input(s): INR, PROTIME in the last 168 hours. Cardiac Enzymes: No results for input(s): CKTOTAL, CKMB, CKMBINDEX, TROPONINI in the last 168 hours. BNP (last 3 results) No results for input(s): PROBNP in the last 8760 hours. HbA1C: No results for input(s): HGBA1C in the last 72 hours. CBG: Recent Labs  Lab 05/05/20 1649 05/05/20 2121 05/06/20 0809 05/06/20 1149 05/06/20 1711  GLUCAP 129* 102* 101* 107* 101*   Lipid Profile: No results for input(s): CHOL, HDL, LDLCALC, TRIG, CHOLHDL, LDLDIRECT in the last 72 hours. Thyroid Function Tests: No results for input(s): TSH, T4TOTAL, FREET4, T3FREE, THYROIDAB in the last 72 hours. Anemia Panel: No results for input(s):  VITAMINB12, FOLATE, FERRITIN, TIBC, IRON, RETICCTPCT in the last 72 hours. Sepsis Labs: Recent Labs  Lab 05/02/20 1528  LATICACIDVEN 1.4    Recent Results (from the past 240 hour(s))  Culture, Urine     Status: None   Collection Time: 05/02/20  4:10 PM   Specimen: Urine, Random  Result Value Ref Range Status   Specimen Description URINE, RANDOM  Final   Special Requests NONE  Final   Culture   Final    NO GROWTH Performed at [facility], [address] [place], [place] [address]    Report Status 05/04/2020 FINAL  Final  SARS Coronavirus 2 by RT PCR (hospital order, performed in [facility] hospital lab) Nasopharyngeal     Status: None   Collection Time: 05/02/20  4:23 PM   Specimen: Nasopharyngeal  Result Value Ref Range Status   SARS Coronavirus 2 NEGATIVE NEGATIVE Final    Comment: (NOTE) SARS-CoV-2 target nucleic acids are NOT DETECTED.  The SARS-CoV-2 RNA is generally detectable in upper and lower respiratory specimens during the acute phase of infection. The lowest concentration of SARS-CoV-2 viral copies this assay can detect is 250 copies / mL. A negative result does not preclude SARS-CoV-2 infection and should not be used as the sole basis for treatment or other patient management decisions.  A negative result may occur with improper specimen collection / handling, submission of specimen other than nasopharyngeal swab, presence of viral mutation(s) within the areas targeted by this assay, and inadequate number of viral copies (<250 copies / mL). A negative result must be combined with clinical observations, patient history, and epidemiological information.  Fact Sheet for Patients:   StrictlyIdeas.no  Fact Sheet for Healthcare Providers: BankingDealers.co.za  This test is not yet approved or  cleared by the [place] FDA and has been authorized for detection and/or diagnosis of SARS-CoV-2 by FDA under an  Emergency Use Authorization (EUA).  This EUA will remain in effect (meaning this test can be used) for the duration of the COVID-19 declaration under Section 564(b)(1) of the Act, 21 U.S.C. section 360bbb-3(b)(1), unless the authorization is terminated or revoked sooner.  Performed at [facility], [place] [address], [place], [place] [address]          Radiology Studies: CT CERVICAL  SPINE WO CONTRAST  Result Date: 05/06/2020 CLINICAL DATA:  Spinal stenosis. EXAM: CT CERVICAL SPINE WITHOUT CONTRAST TECHNIQUE: Multidetector CT imaging of the cervical spine was performed without intravenous contrast. Multiplanar CT image reconstructions were also generated. COMPARISON:  MRI 05/04/2020 FINDINGS: Alignment: Facet joints are aligned without dislocation. Dens and lateral masses are aligned. 3 mm anterolisthesis C7 on T1. Straightening of the cervical lordosis. Skull base and vertebrae: No acute fracture. No suspicious bone lesion. Prior C3-4 ACDF with intact well-seated hardware. Solid interbody fusion of C4-5 and C6-7. Soft tissues and spinal canal: No prevertebral fluid or swelling. No visible canal hematoma. Disc levels: Severe degenerative disc disease at the C5-6 level. Multilevel bilateral facet and uncovertebral arthropathy. No appreciable interval change from prior. Please see dedicated cervical spine MRI 05/04/2020 for full level by level evaluation. Upper chest: Visualized lung apices are clear. Other: Bilateral carotid and vertebral artery atherosclerosis. IMPRESSION: 1. No acute fracture or traumatic listhesis of the cervical spine. 2. Prior C3-4 ACDF with intact well-seated hardware. Solid interbody fusion of C4-5 and C6-7. 3. Severe degenerative disc disease at C5-6 with multilevel bilateral facet and uncovertebral arthropathy. No appreciable interval change from prior. Please see dedicated cervical spine MRI 05/04/2020 for full level by level evaluation. Electronically Signed   By:  Davina Poke D.O.   On: 05/06/2020 13:43        Scheduled Meds: . aspirin EC  81 mg Oral Daily  . carvedilol  3.125 mg Oral BID WC  . divalproex  500 mg Oral Q12H  . folic acid  1 mg Oral Daily  . insulin aspart  0-5 Units Subcutaneous QHS  . insulin aspart  0-9 Units Subcutaneous TID WC  . isosorbide-hydrALAZINE  1 tablet Oral TID  . multivitamin with minerals  1 tablet Oral Daily  . sodium chloride flush  3 mL Intravenous Q12H  . tamsulosin  0.4 mg Oral QPC supper  . thiamine  100 mg Oral Daily   Or  . thiamine  100 mg Intravenous Daily   Continuous Infusions: . sodium chloride    . levETIRAcetam 500 mg (05/06/20 0854)     LOS: 4 days   Time spent:25 mins.  Bonnell Public, MD [facility] P8/04/2020, 6:08 PM

## 2020-05-06 NOTE — Progress Notes (Signed)
Progress Note  Patient Name: Tim Bradley Date of Encounter: 05/06/2020  Primary Cardiologist: Jenkins Rouge, MD   Subjective   No SOB or chest pain.   Inpatient Medications    Scheduled Meds: . aspirin EC  81 mg Oral Daily  . carvedilol  3.125 mg Oral BID WC  . divalproex  500 mg Oral Q12H  . folic acid  1 mg Oral Daily  . insulin aspart  0-5 Units Subcutaneous QHS  . insulin aspart  0-9 Units Subcutaneous TID WC  . isosorbide-hydrALAZINE  1 tablet Oral TID  . multivitamin with minerals  1 tablet Oral Daily  . sodium chloride flush  3 mL Intravenous Q12H  . tamsulosin  0.4 mg Oral QPC supper  . thiamine  100 mg Oral Daily   Or  . thiamine  100 mg Intravenous Daily   Continuous Infusions: . sodium chloride    . levETIRAcetam 500 mg (05/06/20 0854)   PRN Meds: sodium chloride, acetaminophen, sodium chloride flush   Vital Signs    Vitals:   05/05/20 1704 05/05/20 2030 05/06/20 0356 05/06/20 0901  BP: (!) 111/56 110/66 126/63 (!) 151/91  Pulse: 76 76 80 72  Resp:  14 15   Temp:  98.7 F (37.1 C) 98.4 F (36.9 C)   TempSrc:  Oral Oral   SpO2:  97% 97%   Weight:   97.9 kg   Height:        Intake/Output Summary (Last 24 hours) at 05/06/2020 1001 Last data filed at 05/06/2020 0700 Gross per 24 hour  Intake --  Output 1275 ml  Net -1275 ml   Filed Weights   05/04/20 0500 05/05/20 0434 05/06/20 0356  Weight: 93 kg 95.1 kg 97.9 kg    Telemetry    SR, LBBB - Personally Reviewed  ECG    SR LBBB QRS 154 ms - Personally Reviewed  Physical Exam   GEN: No acute distress.   Neck: No JVD Cardiac: RRR, no murmurs, rubs, or gallops.  Respiratory: Clear to auscultation bilaterally. GI: Soft, nontender, non-distended  MS: No edema; No deformity. Neuro:  Nonfocal  Psych: Normal affect, o x 3, resting but alert.   Labs    Chemistry Recent Labs  Lab 05/02/20 1528 05/03/20 0441 05/04/20 0516 05/05/20 0026 05/06/20 0557  NA 138   < > 141 140 142  K 4.4    < > 4.0 3.9 3.8  CL 105   < > 103 103 105  CO2 25   < > 27 25 25   GLUCOSE 100*   < > 101* 87 117*  BUN 56*   < > 57* 60* 56*  CREATININE 2.61*   < > 3.01* 2.90* 2.70*  CALCIUM 8.7*   < > 8.6* 8.5* 8.7*  PROT 5.9*  --   --   --   --   ALBUMIN 3.1*  --   --   --   --   AST 33  --   --   --   --   ALT 20  --   --   --   --   ALKPHOS 59  --   --   --   --   BILITOT 0.5  --   --   --   --   GFRNONAA 24*   < > 20* 21* 23*  GFRAA 27*   < > 23* 24* 26*  ANIONGAP 8   < > 11 12 12    < > =  values in this interval not displayed.     Hematology Recent Labs  Lab 05/02/20 1528  WBC 6.0  RBC 4.18*  HGB 11.9*  HCT 38.2*  MCV 91.4  MCH 28.5  MCHC 31.2  RDW 13.0  PLT 262    Cardiac EnzymesNo results for input(s): TROPONINI in the last 168 hours. No results for input(s): TROPIPOC in the last 168 hours.   BNP Recent Labs  Lab 05/02/20 1528  BNP 740.3*     DDimer No results for input(s): DDIMER in the last 168 hours.   Radiology    MR CERVICAL SPINE WO CONTRAST  Result Date: 05/04/2020 CLINICAL DATA:  Acute or progressive myelopathy. History of cervical fusion. EXAM: MRI CERVICAL SPINE WITHOUT CONTRAST TECHNIQUE: Multiplanar, multisequence MR imaging of the cervical spine was performed. No intravenous contrast was administered. COMPARISON:  MRI 08/31/2018.  Cervical radiographs 12/24/2017 FINDINGS: Alignment: Mild retrolisthesis C5-6.  Mild anterolisthesis C7-T1. Vertebrae: Negative for fracture or mass. Cord: Suboptimal cord evaluation. Image quality limited by body habitus and patient positioning and motion. Possible mild cord hyperintensity below the C5-6 disc space. Posterior Fossa, vertebral arteries, paraspinal tissues: Negative Disc levels: C2-3: Disc degeneration with diffuse uncinate spurring. Bilateral facet hypertrophy. Moderate foraminal stenosis bilaterally and mild spinal stenosis. Mild progression of spinal stenosis since the prior study. C3-4: ACDF with anterior plate and  solid fusion. Mild foraminal narrowing bilaterally due to spurring. Spinal canal adequate in size. C4-5: Solid interbody fusion. No plate is present at this time. There is mild foraminal narrowing bilaterally due to spurring. No interval change. C5-6: Advanced disc degeneration and spondylosis with diffuse uncinate spurring. There is severe spinal stenosis with compression of the cord. This has progressed since the prior study. There is severe foraminal encroachment bilaterally due to spurring. C6-7: Solid interbody fusion. Severe left foraminal encroachment due to large osteophyte. No interval change. C7-T1: Mild anterolisthesis. Moderately large central disc protrusion with upgoing disc material. Mild progression. Bilateral facet hypertrophy. Severe spinal stenosis and cord compression has progressed. Severe foraminal stenosis bilaterally with progression. T1-2: Disc degeneration with diffuse uncinate spurring. Mild spinal stenosis. Moderate foraminal stenosis bilaterally. No interval change. IMPRESSION: 1. Moderate foraminal stenosis bilaterally at C2-3 with mild spinal stenosis. Mild progression 2. ACDF C3-4 with mild foraminal narrowing bilaterally 3. Interbody fusion C4-5 with mild foraminal narrowing bilaterally. 4. Severe spinal stenosis with cord compression at C5-6. This has progressed since the prior study. Severe foraminal encroachment bilaterally due to spurring 5. Solid interbody fusion C6-7 with severe left foraminal encroachment unchanged. 6. Moderately large central disc protrusion at C7-T1 with progression. Severe spinal stenosis and cord compression. 7. These results were called by telephone at the time of interpretation on 05/04/2020 at 5:37 pm to provider [facility] , who verbally acknowledged these results. Electronically Signed   By: Franchot Gallo M.D.   On: 05/04/2020 17:39   MR THORACIC SPINE WO CONTRAST  Result Date: 05/04/2020 CLINICAL DATA:  Myelopathy, acute or progressive.  EXAM: MRI THORACIC SPINE WITHOUT CONTRAST TECHNIQUE: Multiplanar, multisequence MR imaging of the thoracic spine was performed. No intravenous contrast was administered. COMPARISON:  MRI cervical spine 05/04/2020.  Lumbar  MRI 11/27/2017 FINDINGS: Alignment: Image quality degraded by motion. The final axial gradient echo sequence was not possible as the patient prematurely terminated the exam. Anterolisthesis C7-T1 approximately 2-3 mm. Remainder of the thoracic alignment is normal. Vertebrae: Chronic compression fracture T12 vertebral body. No acute thoracic fracture or mass. Cord: Limited cord evaluation due to motion. Possible cord  signal abnormality at T10-11 on the sagittal IR sequence. There is associated spinal stenosis at this level which is moderate to severe. Paraspinal and other soft tissues: Negative for paraspinous mass or fluid. No soft tissue edema. Disc levels: C7-T1: Central disc protrusion with diffuse endplate spurring. 2- 3 mm anterolisthesis with severe facet degeneration. Moderate to severe spinal stenosis with cord compression. Severe foraminal could bilaterally. This was also seen on the cervical spine MRI earlier today. T1-2: Disc degeneration and diffuse endplate spurring. Bilateral facet hypertrophy. Cord flattening with mild spinal stenosis. Moderate to severe foraminal encroachment bilaterally due to spurring. T2-3: Disc degeneration with diffuse endplate spurring. Mild foraminal narrowing bilaterally. T3-4: Disc degeneration and diffuse endplate spurring. Right-sided disc/osteophyte moderate right foraminal stenosis. Mild left foraminal stenosis. Mild spinal stenosis. T4-5: Disc degeneration with diffuse endplate spurring. Mild foraminal narrowing bilaterally. T5-6: Disc degeneration with diffuse endplate spurring. Left-sided disc/osteophyte complex with moderate left foraminal encroachment. Mild spinal stenosis. T6-7: Small central disc protrusion. Bilateral facet hypertrophy. Mild  foraminal narrowing bilaterally. T7-8: Small central and left-sided disc protrusion. There is associated spurring on the left with moderate to severe left foraminal encroachment. Bilateral facet hypertrophy and mild spinal stenosis. T8-9: Small central disc protrusion. Mild spinal stenosis and moderate foraminal stenosis bilaterally. T9-10: Disc degeneration with central and left-sided disc and osteophyte complex. Bilateral facet hypertrophy. Moderate spinal stenosis. Severe left foraminal stenosis. T10-11: Broad-based disc protrusion and spurring with severe facet degeneration. Moderate to severe spinal stenosis and severe foraminal encroachment bilaterally. Possible cord signal hyperintensity. T11-12: Disc degeneration with endplate spurring and moderate foraminal encroachment bilaterally. T12-L1: Marked facet hypertrophy bilaterally. Mild spinal stenosis and mild foraminal stenosis bilaterally. IMPRESSION: Chronic compression fracture T12.  No acute fracture Image quality degraded by motion. Moderate to severe spinal stenosis at C7-T1 with cord compression. Severe foraminal encroachment bilaterally. Moderate to severe spinal stenosis and cord compression at T10-11 with possible cord signal abnormality. Severe foraminal encroachment bilaterally Diffuse disc and facet degeneration throughout the thoracic spine as described above Electronically Signed   By: Franchot Gallo M.D.   On: 05/04/2020 18:31    Cardiac Studies   IMPRESSIONS   1. Left ventricular ejection fraction, by estimation, is <20%. The left  ventricle has severely decreased function. The left ventricle demonstrates  global hypokinesis. There is moderate concentric left ventricular  hypertrophy. Left ventricular diastolic  parameters are consistent with Grade I diastolic dysfunction (impaired  relaxation).  2. Right ventricular systolic function is mildly reduced. The right  ventricular size is normal. There is normal pulmonary artery  systolic  pressure.  3. Left atrial size was mildly dilated.  4. The mitral valve is normal in structure. Trivial mitral valve  regurgitation. No evidence of mitral stenosis.  5. The aortic valve is tricuspid. Aortic valve regurgitation is not  visualized. No aortic stenosis is present.  6. Aortic dilatation noted. Aneurysm of the ascending aorta, measuring 41  mm. There is mild dilatation at the level of the sinuses of Valsalva  measuring 37 mm.  7. The inferior vena cava is dilated in size with >50% respiratory  variability, suggesting right atrial pressure of 8 mmHg.   Patient Profile     69 y.o. male with a hx of hypertension, seizure disorder, DM 2, polysubstance abuse (tobacco, alcohol and cocaine), hypertension, TAAand recently diagnosed systolic heart failure.  Assessment & Plan   Principal Problem:   Acute on chronic congestive heart failure (HCC) Active Problems:   Generalized weakness   Acute encephalopathy  Diarrhea   QT prolongation   Pressure injury of skin   Chronic systolic CHF - continue coreg and bidil. Appears euvolemic and comfortable, hold diuresis today. Cr remains abnormal and elevated from baseline (recently baseline of 1.9-2.1).  Preoperative CV risk assessment - Discussed with Dr. Reatha Armour neurosurgery yesterday. Question regarding whether patient can come off of aspirin prior to surgery and any other factors for optimization for outpatient neurosurgery for cervical spinal stenosis.  - He has not yet had an ischemic evaluation due to inconsistent follow up as outpatient. While unlikely to improve his risk assessment, which remains high risk for MACE, this may need to be completed inpatient so as to provide recommendations on medical therapy and further optimization since patient has had difficulty completing this as outpatient. Of course, we would not plan for PCI with DAPT commitment prior to neurosurgery.  - Final risk assessment/optimization  comments pending nuclear stress test, planned for tomorrow. Discussed with patient who agrees and would like to proceed.  -He intends to have surgery as well if it is recommended.      For questions or updates, please contact [place] Please consult [email or web address] for contact info under        Signed, Elouise Munroe, MD  05/06/2020, 10:01 AM

## 2020-05-07 ENCOUNTER — Inpatient Hospital Stay (HOSPITAL_COMMUNITY): Payer: No Typology Code available for payment source

## 2020-05-07 DIAGNOSIS — I509 Heart failure, unspecified: Secondary | ICD-10-CM

## 2020-05-07 LAB — BASIC METABOLIC PANEL
Anion gap: 10 (ref 5–15)
BUN: 54 mg/dL — ABNORMAL HIGH (ref 8–23)
CO2: 24 mmol/L (ref 22–32)
Calcium: 8.6 mg/dL — ABNORMAL LOW (ref 8.9–10.3)
Chloride: 106 mmol/L (ref 98–111)
Creatinine, Ser: 2.76 mg/dL — ABNORMAL HIGH (ref 0.61–1.24)
GFR calc Af Amer: 26 mL/min — ABNORMAL LOW (ref >60)
GFR calc non Af Amer: 22 mL/min — ABNORMAL LOW (ref >60)
Glucose, Bld: 92 mg/dL (ref 70–99)
Potassium: 4 mmol/L (ref 3.5–5.1)
Sodium: 140 mmol/L (ref 135–145)

## 2020-05-07 LAB — GLUCOSE, CAPILLARY
Glucose-Capillary: 91 mg/dL (ref 70–99)
Glucose-Capillary: 149 mg/dL — ABNORMAL HIGH (ref 70–99)
Glucose-Capillary: 138 mg/dL — ABNORMAL HIGH (ref 70–99)
Glucose-Capillary: 182 mg/dL — ABNORMAL HIGH (ref 70–99)

## 2020-05-07 LAB — NM MYOCAR MULTI W/SPECT W/WALL MOTION / EF
Estimated workload: 1 METS
Exercise duration (min): 0 min
Exercise duration (sec): 0 s
MPHR: 149 {beats}/min
Peak HR: 81 {beats}/min
Percent HR: 54 %
Rest HR: 70 {beats}/min
TID: 1.03

## 2020-05-07 MED ORDER — TECHNETIUM TC 99M TETROFOSMIN IV KIT
32.5000 | PACK | Freq: Once | INTRAVENOUS | Status: AC | PRN
  Administered 2020-05-07: 32.5 via INTRAVENOUS

## 2020-05-07 MED ORDER — TECHNETIUM TC 99M TETROFOSMIN IV KIT
10.5000 | PACK | Freq: Once | INTRAVENOUS | Status: AC | PRN
  Administered 2020-05-07: 10.5 via INTRAVENOUS

## 2020-05-07 MED ORDER — REGADENOSON 0.4 MG/5ML IV SOLN
0.4000 mg | Freq: Once | INTRAVENOUS | Status: AC
  Administered 2020-05-07: 0.4 mg via INTRAVENOUS
  Filled 2020-05-07: qty 5

## 2020-05-07 MED ORDER — REGADENOSON 0.4 MG/5ML IV SOLN
INTRAVENOUS | Status: AC
  Filled 2020-05-07: qty 5

## 2020-05-07 NOTE — TOC Progression Note (Signed)
Transition of Care [facility]) - Progression Note    Patient Details  Name: Tim Bradley MRN: [id number] Date of Birth: [birth year]  Transition of Care [facility]) CM/SW [name], [place] Phone Number: 05/07/2020, 9:49 AM  Clinical Narrative:     CSW faxed community living center screening checklist and requested clinicals to [employer] at [phone]. CSW updated patient and [name], pts sister about process. CSW texted [name] [facility] and gave copy to pt.   CSW will continue to follow.  Expected Discharge Plan: Skilled Nursing Facility Barriers to Discharge: Active Substance Use - Placement, Continued Medical Work up  Expected Discharge Plan and Services Expected Discharge Plan: [place] arrangements for the past 2 months: [name] Determinants of Health (SDOH) Interventions    Readmission Risk Interventions Readmission Risk Prevention Plan 07/24/2019  Transportation Screening Complete  PCP or Specialist Appt within 3-5 Days Complete  HRI or [place] Complete  Social Work Consult for [name] Planning/Counseling Complete  Palliative Care Screening Not Applicable  Medication Review [employer]) Complete  Some recent data might be hidden   Emeterio Reeve, [name], [name] Social Worker [phone]

## 2020-05-07 NOTE — Progress Notes (Addendum)
Progress Note  Patient Name: Tim Bradley Date of Encounter: 05/07/2020  [facility] Cardiologist: Jenkins Rouge, MD   Subjective   Seen in [facility] today. No complaints of chest pain, SOB, palpitations, or LE edema.    Inpatient Medications    Scheduled Meds: . aspirin EC  81 mg Oral Daily  . carvedilol  3.125 mg Oral BID WC  . divalproex  500 mg Oral Q12H  . folic acid  1 mg Oral Daily  . insulin aspart  0-5 Units Subcutaneous QHS  . insulin aspart  0-9 Units Subcutaneous TID WC  . isosorbide-hydrALAZINE  1 tablet Oral TID  . levETIRAcetam  500 mg Oral BID  . multivitamin with minerals  1 tablet Oral Daily  . sodium chloride flush  3 mL Intravenous Q12H  . tamsulosin  0.4 mg Oral QPC supper  . thiamine  100 mg Oral Daily   Or  . thiamine  100 mg Intravenous Daily   Continuous Infusions: . sodium chloride     PRN Meds: sodium chloride, acetaminophen, sodium chloride flush   Vital Signs    Vitals:   05/06/20 1749 05/06/20 1903 05/07/20 0500 05/07/20 0805  BP: 135/76 128/70 121/66 138/74  Pulse: 82 81 74   Resp:  18 15   Temp:  98.6 F (37 C) 98.8 F (37.1 C)   TempSrc:  Oral Oral   SpO2:  100% 100%   Weight:   97.5 kg   Height:        Intake/Output Summary (Last 24 hours) at 05/07/2020 0815 Last data filed at 05/07/2020 0458 Gross per 24 hour  Intake 960 ml  Output 1900 ml  Net -940 ml   Last 3 Weights 05/07/2020 05/06/2020 05/05/2020  Weight (lbs) 215 lb 215 lb 14.4 oz 209 lb 10.5 oz  Weight (kg) 97.523 kg 97.932 kg 95.1 kg      Telemetry    NSR, LBBB, HR 60-70 - Personally Reviewed  ECG    No new - Personally Reviewed  Physical Exam   GEN: Laying in bed in no acute distress.   Neck: No JVD Cardiac: RRR, no murmurs, rubs, or gallops.  Respiratory: Clear to auscultation bilaterally. GI: Soft, nontender, non-distended  MS: No edema; No deformity. Neuro:  Nonfocal  Psych: Normal affect   Labs    High Sensitivity Troponin:   Recent Labs  Lab  04/11/20 0027 04/22/20 0323 04/22/20 0708 05/02/20 1528 05/02/20 1844  TROPONINIHS 14 16 12  21* 19*      Chemistry Recent Labs  Lab 05/02/20 1528 05/03/20 0441 05/05/20 0026 05/06/20 0557 05/07/20 0327  NA 138   < > 140 142 140  K 4.4   < > 3.9 3.8 4.0  CL 105   < > 103 105 106  CO2 25   < > 25 25 24   GLUCOSE 100*   < > 87 117* 92  BUN 56*   < > 60* 56* 54*  CREATININE 2.61*   < > 2.90* 2.70* 2.76*  CALCIUM 8.7*   < > 8.5* 8.7* 8.6*  PROT 5.9*  --   --   --   --   ALBUMIN 3.1*  --   --   --   --   AST 33  --   --   --   --   ALT 20  --   --   --   --   ALKPHOS 59  --   --   --   --  BILITOT 0.5  --   --   --   --   GFRNONAA 24*   < > 21* 23* 22*  GFRAA 27*   < > 24* 26* 26*  ANIONGAP 8   < > 12 12 10    < > = values in this interval not displayed.     Hematology Recent Labs  Lab 05/02/20 1528  WBC 6.0  RBC 4.18*  HGB 11.9*  HCT 38.2*  MCV 91.4  MCH 28.5  MCHC 31.2  RDW 13.0  PLT 262    BNP Recent Labs  Lab 05/02/20 1528  BNP 740.3*     DDimer No results for input(s): DDIMER in the last 168 hours.   Radiology    CT CERVICAL SPINE WO CONTRAST  Result Date: 05/06/2020 CLINICAL DATA:  Spinal stenosis. EXAM: CT CERVICAL SPINE WITHOUT CONTRAST TECHNIQUE: Multidetector CT imaging of the cervical spine was performed without intravenous contrast. Multiplanar CT image reconstructions were also generated. COMPARISON:  MRI 05/04/2020 FINDINGS: Alignment: Facet joints are aligned without dislocation. Dens and lateral masses are aligned. 3 mm anterolisthesis C7 on T1. Straightening of the cervical lordosis. Skull base and vertebrae: No acute fracture. No suspicious bone lesion. Prior C3-4 ACDF with intact well-seated hardware. Solid interbody fusion of C4-5 and C6-7. Soft tissues and spinal canal: No prevertebral fluid or swelling. No visible canal hematoma. Disc levels: Severe degenerative disc disease at the C5-6 level. Multilevel bilateral facet and uncovertebral  arthropathy. No appreciable interval change from prior. Please see dedicated cervical spine MRI 05/04/2020 for full level by level evaluation. Upper chest: Visualized lung apices are clear. Other: Bilateral carotid and vertebral artery atherosclerosis. IMPRESSION: 1. No acute fracture or traumatic listhesis of the cervical spine. 2. Prior C3-4 ACDF with intact well-seated hardware. Solid interbody fusion of C4-5 and C6-7. 3. Severe degenerative disc disease at C5-6 with multilevel bilateral facet and uncovertebral arthropathy. No appreciable interval change from prior. Please see dedicated cervical spine MRI 05/04/2020 for full level by level evaluation. Electronically Signed   By: Davina Poke D.O.   On: 05/06/2020 13:43    Cardiac Studies   Echo  IMPRESSIONS   1. Left ventricular ejection fraction, by estimation, is <20%. The left  ventricle has severely decreased function. The left ventricle demonstrates  global hypokinesis. There is moderate concentric left ventricular  hypertrophy. Left ventricular diastolic  parameters are consistent with Grade I diastolic dysfunction (impaired  relaxation).  2. Right ventricular systolic function is mildly reduced. The right  ventricular size is normal. There is normal pulmonary artery systolic  pressure.  3. Left atrial size was mildly dilated.  4. The mitral valve is normal in structure. Trivial mitral valve  regurgitation. No evidence of mitral stenosis.  5. The aortic valve is tricuspid. Aortic valve regurgitation is not  visualized. No aortic stenosis is present.  6. Aortic dilatation noted. Aneurysm of the ascending aorta, measuring 41  mm. There is mild dilatation at the level of the sinuses of Valsalva  measuring 37 mm.  7. The inferior vena cava is dilated in size with >50% respiratory  variability, suggesting right atrial pressure of 8 mmHg.    Patient Profile     69 y.o. male with history of HTN, seizure disorder, DM2,  polysubstance abuse (tobacco, alcohol and cocaine), hypertension, CKD stage 3-4, BPH, noncompliance (admitted in July but left AMA) who was admitted for confusion and encephalopathy being seen for acute CHF.   Assessment & Plan    1. Chronic  systolic CHF: Echo 2/15 found to have EF 20%, G1DD. Etiology unclear. BNP elevated to 740. CXR showed cardiomegaly with mild central vascular congestion. Etiology unclear, though suspected non-ischemic. Patient appeared euvolemic and diuretics held 8/8 - - creatinine still up 2.90>2.7>2.76. Baseline around ?2.2 - Will follow-up stress test results to evaluate for ischemic etiology.  - Continue coreg and Bidil  2. Preoperative CHF for severe cervical spinal stenosis: Patient has not had prior ischemic evaluation due to inconsistent follow up as outpatient. Plan for stress test today to improve optimization of medical therapy.  No plan for PCI with DAPT given neurosurgery. Case previously discussed with neurosurgery who recommended being off aspirin 5-7 days prior to surgery. Planning outpatient surgery - Will follow-up stress test results.  - Patient overall risk is moderate to high  For questions or updates, please contact [place] Please consult [email or web address] for contact info under        Signed, Cadence Ninfa Meeker, PA-C  05/07/2020, 8:15 AM    Patient seen and examined with Roby Lofts PA-C.  Agree as above, with the following exceptions and changes as noted below. No CP or SOB. Gen: NAD, CV: RRR, no murmurs, Lungs: clear, Abd: soft, Extrem: Warm, well perfused, no edema, Neuro/Psych: alert and oriented x 3, normal mood and affect. All available labs, radiology testing, previous records reviewed. Stress results pending. Remains euvolemic. Will follow with stress results.   Elouise Munroe, MD 05/07/20 5:07 PM

## 2020-05-07 NOTE — Progress Notes (Signed)
Physical Therapy Treatment Patient Details Name: Tim Bradley MRN: [id number] DOB: 1951-05-06 Today's Date: 05/07/2020    History of Present Illness Pt is a 69 y.o. M with significant PMH of polysubstance abuse, combined systolic and diastolic heart failure, hypertension, type 2 diabetes mellitus, CKD, seizure disorder who presents with seizure like activity and AMS. CT head unremarkable for acute findings, small amount of hyperdensity in left frontal lobe concerning for [facility] - also present on old SAH. UDS positive for cocaine.     PT Comments    Pt supine in bed on entry agreeable to working with therapy. Pt known to therapist from prior admission where he fell out recliner so goal of session to work on LE strength and then return to bed. Pt requires modA for bed mobility and minA-min guard for sit>stand in [place]. Pt reports strange sensation in LE when standing and asks if therapist is holding him up when he is standing. Pt educated on nerve conduction inhibition with spinal cord compression which could be leading to decreased LE strength and sensation. PT continues to recommend SNF level rehab at d/c. PT will continue to follow acutely.     Follow Up Recommendations  SNF;Supervision/Assistance - 24 hour     Equipment Recommendations  None recommended by PT    Recommendations for Other Services OT consult     Precautions / Restrictions Precautions Precautions: Fall Precaution Comments: self reports 3 falls in last month Restrictions Weight Bearing Restrictions: No    Mobility  Bed Mobility Overal bed mobility: Needs Assistance Bed Mobility: Supine to Sit;Sit to Supine     Supine to sit: Mod assist Sit to supine: Mod assist   General bed mobility comments: ModA to pull up to sit and for BLE assist back into bed  Transfers Overall transfer level: Needs assistance   Transfers: Sit to/from Stand Sit to Stand: Min assist;Min guard         General transfer comment: with  ability to use UE to pull up on [place] pt progress from min A to min guard for sit>stand        Balance Overall balance assessment: Needs assistance Sitting-balance support: Bilateral upper extremity supported;Feet supported Sitting balance-Leahy Scale: Poor Sitting balance - Comments: Reliant on BUE support, supervision for safety   Standing balance support: Bilateral upper extremity supported;During functional activity Standing balance-Leahy Scale: Poor Standing balance comment: relies on UE and external support                            Cognition Arousal/Alertness: Awake/alert Behavior During Therapy: WFL for tasks assessed/performed Overall Cognitive Status: Impaired/Different from baseline Area of Impairment: Problem solving;Awareness                       Following Commands: Follows multi-step commands consistently Safety/Judgement: Decreased awareness of safety Awareness: Emergent Problem Solving: Requires verbal cues;Requires tactile cues;Slow processing General Comments: pt requires increased cuing for safety      Exercises Other Exercises Other Exercises: sit>stand from [facility] pad x10 Other Exercises: sit>stand from bed surface x 10     General Comments General comments (skin integrity, edema, etc.): VSS on RA       Pertinent Vitals/Pain Pain Assessment: No/denies pain           PT Goals (current goals can now be found in the care plan section) Acute Rehab PT Goals Patient Stated Goal: to walk again PT Goal  Formulation: With patient Time For Goal Achievement: 05/17/20 Potential to Achieve Goals: Fair Progress towards PT goals: Progressing toward goals    Frequency    Min 2X/week      PT Plan Current plan remains appropriate    Co-evaluation              AM-PAC PT "6 Clicks" Mobility   Outcome Measure  Help needed turning from your back to your side while in a flat bed without using bedrails?: A Little Help needed  moving from lying on your back to sitting on the side of a flat bed without using bedrails?: A Lot Help needed moving to and from a bed to a chair (including a wheelchair)?: A Lot Help needed standing up from a chair using your arms (e.g., wheelchair or bedside chair)?: A Lot Help needed to walk in hospital room?: Total Help needed climbing 3-5 steps with a railing? : Total 6 Click Score: 11    End of Session Equipment Utilized During Treatment: Gait belt Activity Tolerance: Patient tolerated treatment well Patient left: in bed;with call bell/phone within reach;with bed alarm set Nurse Communication: Mobility status PT Visit Diagnosis: Unsteadiness on feet (R26.81);Muscle weakness (generalized) (M62.81);History of falling (Z91.81);Repeated falls (R29.6)     Time: 7017-7939 PT Time Calculation (min) (ACUTE ONLY): 14 min  Charges:  $Therapeutic Activity: 8-22 mins                     Halo Shevlin B. Migdalia Dk PT, DPT Acute Rehabilitation Services Pager [phone] Office [phone]    [place] 05/07/2020, 4:01 PM

## 2020-05-07 NOTE — NC FL2 (Signed)
Micro LEVEL OF CARE SCREENING TOOL     IDENTIFICATION  Patient Name: Tim Bradley Birthdate: Jun 13, 1951 Sex: male Admission Date (Current Location): 05/02/2020  [facility] and [place] Number:  Herbalist and Address:  [facility] [place], [place] [address], [place], [place] [address]      Provider Number: [id number]  Attending Physician Name and Address:  Terrilee Croak, MD  Relative Name and Phone Number:  [name] [phone]    Current Level of Care: Hospital Recommended Level of Care: [place] Prior Approval Number:    Date Approved/Denied:   PASRR Number: [id number]  Discharge Plan: SNF    Current Diagnoses: Patient Active Problem List   Diagnosis Date Noted  . Pressure injury of skin 05/03/2020  . Acute encephalopathy 05/02/2020  . Diarrhea 05/02/2020  . QT prolongation 05/02/2020  . Acute combined systolic and diastolic congestive heart failure ([place]) 04/22/2020  . Essential hypertension 04/11/2020  . Leg edema 04/11/2020  . Dyspnea 04/11/2020  . Acute on chronic congestive heart failure ([place]) 04/11/2020  . BPH (benign prostatic hyperplasia) 04/11/2020  . 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 Generalized weakness 04/11/2020  . Seizure ([place]) 07/21/2019  . Cocaine abuse ([place])   . Constipation 08/22/2018  . Gastritis 08/20/2018  . Heme positive stool   . Acute blood loss anemia secondary to massive gastric ulcer   . Hyponatremia   . Abscess, gluteal, left 03/26/2017  . Type 2 diabetes mellitus ([place]) 03/26/2017  . Adrenal nodule ([place]) 03/26/2017  . Closed fracture of body of thoracic vertebra ([place]) 03/26/2017  . Spinal stenosis of lumbar region 03/26/2017    Orientation RESPIRATION BLADDER Height & Weight     Self, Time, Situation, Place  Normal Incontinent, External catheter (External Urinary Catheter) Weight: 215 lb (97.5 kg) Height:  6\' 2"  (188 cm)  BEHAVIORAL  SYMPTOMS/MOOD NEUROLOGICAL BOWEL NUTRITION STATUS      Continent Diet (See Discharge Summary)  AMBULATORY STATUS COMMUNICATION OF NEEDS Skin   Limited Assist Verbally Other (Comment) (Approp for ethnicity,cracking foot bilateral,PI Buttocks stage 2 patial thickness loss of dermis presenting as a shallow open injury with a red pink wound bed withough slough;dry;intact)                       Personal Care Assistance Level of Assistance  Bathing, Feeding, Dressing Bathing Assistance: Limited assistance Feeding assistance: Independent (able to feed self;cardiac;carb modified) Dressing Assistance: Limited assistance     Functional Limitations Info  Sight, Hearing, Speech Sight Info: Adequate Hearing Info: Adequate Speech Info: Adequate    SPECIAL CARE FACTORS FREQUENCY  PT (By licensed PT), OT (By licensed OT)     PT Frequency: 5x min weekly OT Frequency: 5x min weekly            Contractures Contractures Info: Not present    Additional Factors Info  Code Status, Allergies, Insulin Sliding Scale Code Status Info: FULL Allergies Info: No known allergies   Insulin Sliding Scale Info: insulin aspart (novoLOG) injection 0-5 Units daily at bedtime,insulin aspart (novoLOG) injection 0-9 Units 3 times daily with meals       Current Medications (05/07/2020):  This is the current hospital active medication list Current Facility-Administered Medications  Medication Dose Route Frequency Provider Last Rate Last Admin  . 0.9 %  sodium chloride infusion  250 mL Intravenous PRN Shela Leff, MD      . acetaminophen (  TYLENOL) tablet 650 mg  650 mg Oral Q4H PRN Shela Leff, MD   650 mg at 05/05/20 1101  . aspirin EC tablet 81 mg  81 mg Oral Daily Shelly Coss, MD   81 mg at 05/07/20 0805  . carvedilol (COREG) tablet 3.125 mg  3.125 mg Oral BID WC Shelly Coss, MD   3.125 mg at 05/07/20 0805  . divalproex (DEPAKOTE) DR tablet 500 mg  500 mg Oral Q12H Greta Doom, MD   500 mg at 05/07/20 0800  . folic acid (FOLVITE) tablet 1 mg  1 mg Oral Daily Shela Leff, MD   1 mg at 05/07/20 0806  . insulin aspart (novoLOG) injection 0-5 Units  0-5 Units Subcutaneous QHS Shela Leff, MD      . insulin aspart (novoLOG) injection 0-9 Units  0-9 Units Subcutaneous TID WC Shela Leff, MD   1 Units at 05/07/20 1239  . isosorbide-hydrALAZINE (BIDIL) 20-37.5 MG per tablet 1 tablet  1 tablet Oral TID Shelly Coss, MD   1 tablet at 05/07/20 0805  . levETIRAcetam (KEPPRA) tablet 500 mg  500 mg Oral BID Dana Allan I, MD   500 mg at 05/07/20 0805  . multivitamin with minerals tablet 1 tablet  1 tablet Oral Daily Shela Leff, MD   1 tablet at 05/07/20 0804  . sodium chloride flush (NS) 0.9 % injection 3 mL  3 mL Intravenous Q12H Shela Leff, MD   3 mL at 05/07/20 0809  . sodium chloride flush (NS) 0.9 % injection 3 mL  3 mL Intravenous PRN Shela Leff, MD      . tamsulosin (FLOMAX) capsule 0.4 mg  0.4 mg Oral QPC supper Shelly Coss, MD   0.4 mg at 05/06/20 1749  . thiamine tablet 100 mg  100 mg Oral Daily Shela Leff, MD   100 mg at 05/07/20 0805   Or  . thiamine (B-1) injection 100 mg  100 mg Intravenous Daily Shela Leff, MD         Discharge Medications: Please see discharge summary for a list of discharge medications.  Relevant Imaging Results:  Relevant Lab Results:   Additional Information [id number]  [name], LCSWA

## 2020-05-07 NOTE — TOC Progression Note (Addendum)
Transition of Care [facility]) - Progression Note    Patient Details  Name: SABIEN UMLAND MRN: [id number] Date of Birth: 04-05-1951  Transition of Care [facility]) CM/SW [place], [place] Phone Number: 05/07/2020, 2:41 PM  Clinical Narrative:      Update 3:25pm- CSW faxed clinicals to all possible SNF options for patient. CSW will follow up with referrals for possible SNF bed offers.  Pending SNF bed offers.   CSW will continue to follow. CSW received call from [place]. Referral to SNF was approved. CSW provided list of facilitys that [place] provided to patient. Patient chose [name] as first option for SNF placement. Second choice would be [facility]. CSW called [name] for possible SNF placement. [name] says they cannot offer a SNF bed for patient. CSW called [facility] and they told CSW to fax referral over and they will review. CSW will fax referral over for review.  Pending bed offer.[facility] approved for SNF placement.  CSW will continue to follow.  Expected Discharge Plan: Skilled Nursing Facility Barriers to Discharge: Active Substance Use - Placement, Continued Medical Work up  Expected Discharge Plan and Services Expected Discharge Plan: [place] arrangements for the past 2 months: Single Family Home                                       Social Determinants of Health (SDOH) Interventions    Readmission Risk Interventions Readmission Risk Prevention Plan 07/24/2019  Transportation Screening Complete  PCP or Specialist Appt within 3-5 Days Complete  HRI or [place] Complete  Social Work Consult for [place] Planning/Counseling Complete  Palliative Care Screening Not Applicable  Medication Review Press photographer) Complete  Some recent data might be hidden

## 2020-05-07 NOTE — Progress Notes (Signed)
   Tim Bradley presented for a nuclear stress test today.  No immediate complications.  Stress imaging is pending at this time.  Preliminary EKG findings may be listed in the chart, but the stress test result will not be finalized until perfusion imaging is complete.   Abigail Butts, PA-C 05/07/2020, 10:49 AM

## 2020-05-07 NOTE — Progress Notes (Signed)
PROGRESS NOTE  Tim Bradley  DOB: [birth year]  PCP: [place] JAS:[id number]  DOA: 05/02/2020  LOS: 5 days   Chief Complaint  Patient presents with  . 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  Patient has also had weakness of the extremities and numbness of the lower extremities for over 1 year.  Apparently, patient was admitted in July 2021 for acute decompensated CHF and he left AMA at that time.   Patient presented to the ED on 8/4 with diarrhea, bilateral lower extremity edema, confusion and suspected seizure. Patient was admitted under hospitalist service for further assessment and management.   Consultations obtained with neurology, neurosurgery and cardiology.  Subjective: Patient was seen and examined this afternoon.  Not in distress.  No new symptoms. Chart reviewed.  No fever, blood pressure in 130s. Creatinine elevated at 2.7 but at baseline. Undergoing stress test today.  Assessment/Plan: Acute exacerbation of chronic combined systolic/diastolic congestive heart failure:  -Recent Echo on 04/11/2020 with ejection fraction of less than 41%, grade 1 diastolic dysfunction.    Etiology unclear.  Suspected known ischemic.  Stress test today. -He appeared volume overloaded on presentation with significant peripheral edema.   -BNP was elevated to 740.  Chest x-ray showed cardiomegaly with mild central vascular congestion.   -Cardiology was consulted. Patient was adequately diuresed with IV Lasix.  Currently not on diuresis.  He remains euvolemic. -Continue Coreg and BiDil.  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 -Above-mentioned findings noted in MRI. -Neurosurgery team consulted. Recommended outpatient surgery.. -Preoperative risk stratification per cardiology. -Underwent a stress test today.  None reported.  Acute encephalopathy/suspected seizure episodes:  -Reported noncompliance to seizure medications. -Neurology consultation obtained. -EEG showed mild diffuse encephalopathy, no seizures or epileptiform discharges.   -MRI brain did not show any acute pathology. -Patient is currently on Keppra 500 mg p.o. twice daily and Depakote 500 MG p.o. twice daily.   Chronic alcohol abuse:  -No withdrawal symptoms reported.   -CIWA protocol.   -Continue thiamine, folate, multivitamin.    CKD stage 3-4:  Creatinine at baseline between 2-3. Currently stable at baseline.  Substance abuse: UDS positive for cocaine. Counseled.  Diet-controlled type 2 diabetes mellitus: Hemoglobin C 5.9 as per 7/21.  Continue sliding scale insulin  BPH: On Flomax  Poor living condition/generalized weakness: Patient states he does not have any family members.  He cannot walk due to back pain, lower extremity pain.  PT and OT were consulted and he has been recommended skilled facility on discharge.  Social worker following.   Mobility: As above Code Status:   Code Status: Full Code  Nutritional status: Body mass index is 27.6 kg/m.     Diet Order            Diet NPO time specified Except for: Ice Chips  Diet effective midnight                 DVT prophylaxis: SCDs Start: 05/02/20 2024   Antimicrobials:  None Fluid: None  Consultants: Cardiology, neurosurgery Family Communication:  None at bedside  Status is: Inpatient  Remains inpatient appropriate  because:Ongoing diagnostic testing needed not appropriate for outpatient work up   Dispo: The patient is from: Home               Anticipated d/c is to: Home versus SNF              Anticipated d/c date is: 2 days              Patient currently is not medically stable to d/c.  If stress test today is negative, patient may be stable for discharge with a plan of surgery as an outpatient.   Infusions:  . sodium chloride      Scheduled Meds: . aspirin EC  81 mg Oral Daily  . carvedilol  3.125 mg Oral BID WC  . divalproex  500 mg Oral Q12H  . folic acid  1 mg Oral Daily  . insulin aspart  0-5 Units Subcutaneous QHS  . insulin aspart  0-9 Units Subcutaneous TID WC  . isosorbide-hydrALAZINE  1 tablet Oral TID  . levETIRAcetam  500 mg Oral BID  . multivitamin with minerals  1 tablet Oral Daily  . regadenoson      . sodium chloride flush  3 mL Intravenous Q12H  . tamsulosin  0.4 mg Oral QPC supper  . thiamine  100 mg Oral Daily   Or  . thiamine  100 mg Intravenous Daily    Antimicrobials: Anti-infectives (From admission, onward)   None      PRN meds: sodium chloride, acetaminophen, sodium chloride flush   Objective: Vitals:   05/07/20 1036 05/07/20 1038  BP: 115/69 119/66  Pulse: 75 72  Resp:    Temp:    SpO2:      Intake/Output Summary (Last 24 hours) at 05/07/2020 1159 Last data filed at 05/07/2020 0700 Gross per 24 hour  Intake 240 ml  Output 1600 ml  Net -1360 ml   Filed Weights   05/05/20 0434 05/06/20 0356 05/07/20 0500  Weight: 95.1 kg 97.9 kg 97.5 kg   Weight change: -0.408 kg Body mass index is 27.6 kg/m.   Physical Exam: General exam: Appears calm and comfortable.  Not in distress Skin: No rashes, lesions or ulcers. HEENT: Atraumatic, normocephalic, supple neck, no obvious bleeding Lungs: Clear to auscultation bilaterally CVS: Regular rate and rhythm, no murmur GI/Abd soft, nontender, nondistended, bowel sound present  CNS: Alert, awake. Psychiatry: Mood appropriate Extremities: No pedal edema, no calf tenderness  Data Review: I have personally reviewed the laboratory data  and studies available.  Recent Labs  Lab 05/02/20 1528  WBC 6.0  NEUTROABS 3.6  HGB 11.9*  HCT 38.2*  MCV 91.4  PLT 262   Recent Labs  Lab 05/02/20 1528 05/02/20 2025 05/03/20 0441 05/04/20 0516 05/05/20 0026 05/06/20 0557 05/07/20 0327  NA   < >  --  140 141 140 142 140  K   < >  --  3.9 4.0 3.9 3.8 4.0  CL   < >  --  102 103 103 105 106  CO2   < >  --  27 27 25 25 24   GLUCOSE   < >  --  152* 101* 87 117* 92  BUN   < >  --  54* 57* 60* 56* 54*  CREATININE   < >  --  2.75* 3.01* 2.90* 2.70* 2.76*  CALCIUM   < >  --  8.7* 8.6* 8.5* 8.7* 8.6*  MG  --  2.4  --   --   --   --   --  PHOS  --   --  4.2  --   --   --   --    < > = values in this interval not displayed.   Lab Results  Component Value Date   HGBA1C 5.9 (H) 04/22/2020    No results found for: CHOL, TRIG, HDL, CHOLHDL, VLDL, LDLCALC, LDLDIRECT, LABVLDL  Signed, Terrilee Croak, MD [facility] Pager: [phone] (Secure Chat preferred). 05/07/2020

## 2020-05-07 NOTE — Plan of Care (Signed)
  Problem: Clinical Measurements: Goal: Ability to maintain clinical measurements within normal limits will improve Outcome: Progressing Goal: Diagnostic test results will improve Outcome: Progressing   Problem: Pain Managment: Goal: General experience of comfort will improve Outcome: Progressing   Problem: Clinical Measurements: Goal: Respiratory complications will improve Outcome: Completed/Met   Problem: Coping: Goal: Level of anxiety will decrease Outcome: Completed/Met   Problem: Elimination: Goal: Will not experience complications related to bowel motility Outcome: Completed/Met Goal: Will not experience complications related to urinary retention Outcome: Completed/Met

## 2020-05-08 LAB — GLUCOSE, CAPILLARY
Glucose-Capillary: 79 mg/dL (ref 70–99)
Glucose-Capillary: 83 mg/dL (ref 70–99)
Glucose-Capillary: 85 mg/dL (ref 70–99)
Glucose-Capillary: 118 mg/dL — ABNORMAL HIGH (ref 70–99)

## 2020-05-08 NOTE — TOC Progression Note (Signed)
Transition of Care [facility]) - Progression Note    Patient Details  Name: Tim Bradley MRN: [id number] Date of Birth: 24-Jun-1951  Transition [facility] Care [facility]) CM/SW [place], [place] Phone Number: 05/08/2020, 2:40 PM  Clinical Narrative:     CSW spoke with Suanne Marker with [facility] and they are going to assess patient tomorrow to see if they can take patient for SNF placement. Rhonda with [facility] will follow up with CSW about possible bed offer.  Pending bed offer.  CSW will continue to follow.  Expected Discharge Plan: Skilled Nursing Facility Barriers to Discharge: Active Substance Use - Placement, Continued Medical Work up  Expected Discharge Plan and Services Expected Discharge Plan: [place] arrangements for the past 2 months: Single Family Home                                       Social Determinants of Health (SDOH) Interventions    Readmission Risk Interventions Readmission Risk Prevention Plan 07/24/2019  Transportation Screening Complete  PCP or Specialist Appt within 3-5 Days Complete  HRI or [place] Complete  Social Work Consult for [place] Planning/Counseling Complete  Palliative Care Screening Not Applicable  Medication Review Press photographer) Complete  Some recent data might be hidden

## 2020-05-08 NOTE — Progress Notes (Signed)
CSW spoke with patient at bedside. CSW offered patient Outpatient Substance Use Treatment Services resources. Patient accepted.  CSW will continue to follow.

## 2020-05-08 NOTE — Progress Notes (Signed)
Progress Note  Patient Name: Tim Bradley Date of Encounter: 05/08/2020  [facility] Cardiologist: Jenkins Rouge, MD   Subjective   Feels well without CP or SOB. Questions about time frame of discharge and plan for neurosurgery.  Inpatient Medications    Scheduled Meds: . aspirin EC  81 mg Oral Daily  . carvedilol  3.125 mg Oral BID WC  . divalproex  500 mg Oral Q12H  . folic acid  1 mg Oral Daily  . insulin aspart  0-5 Units Subcutaneous QHS  . insulin aspart  0-9 Units Subcutaneous TID WC  . isosorbide-hydrALAZINE  1 tablet Oral TID  . levETIRAcetam  500 mg Oral BID  . multivitamin with minerals  1 tablet Oral Daily  . sodium chloride flush  3 mL Intravenous Q12H  . tamsulosin  0.4 mg Oral QPC supper  . thiamine  100 mg Oral Daily   Or  . thiamine  100 mg Intravenous Daily   Continuous Infusions: . sodium chloride     PRN Meds: sodium chloride, acetaminophen, sodium chloride flush   Vital Signs    Vitals:   05/07/20 2114 05/08/20 0434 05/08/20 0850 05/08/20 1200  BP: (!) 103/58 (!) 99/59 138/89   Pulse: 76 72 83 68  Resp: 15 16 16    Temp: 98.7 F (37.1 C) 98.5 F (36.9 C) 98 F (36.7 C)   TempSrc: Oral Oral Oral   SpO2: 96% 97% 96%   Weight:  96.3 kg    Height:        Intake/Output Summary (Last 24 hours) at 05/08/2020 1511 Last data filed at 05/08/2020 1400 Gross per 24 hour  Intake 666 ml  Output 1475 ml  Net -809 ml   Last 3 Weights 05/08/2020 05/07/2020 05/06/2020  Weight (lbs) 212 lb 4.9 oz 215 lb 215 lb 14.4 oz  Weight (kg) 96.3 kg 97.523 kg 97.932 kg      Telemetry    NSR, LBBB, HR 60-70 - Personally Reviewed  ECG    No new - Personally Reviewed  Physical Exam   GEN: No acute distress.   Neck: No JVD Cardiac: RRR, no murmurs, rubs, or gallops.  Respiratory: Clear to auscultation bilaterally. GI: Soft, nontender, non-distended  MS: No edema; No deformity. Neuro:  Nonfocal  Psych: Normal affect    Labs    High Sensitivity  Troponin:   Recent Labs  Lab 04/11/20 0027 04/22/20 0323 04/22/20 0708 05/02/20 1528 05/02/20 1844  TROPONINIHS 14 16 12  21* 19*      Chemistry Recent Labs  Lab 05/02/20 1528 05/03/20 0441 05/05/20 0026 05/06/20 0557 05/07/20 0327  NA 138   < > 140 142 140  K 4.4   < > 3.9 3.8 4.0  CL 105   < > 103 105 106  CO2 25   < > 25 25 24   GLUCOSE 100*   < > 87 117* 92  BUN 56*   < > 60* 56* 54*  CREATININE 2.61*   < > 2.90* 2.70* 2.76*  CALCIUM 8.7*   < > 8.5* 8.7* 8.6*  PROT 5.9*  --   --   --   --   ALBUMIN 3.1*  --   --   --   --   AST 33  --   --   --   --   ALT 20  --   --   --   --   ALKPHOS 59  --   --   --   --  BILITOT 0.5  --   --   --   --   GFRNONAA 24*   < > 21* 23* 22*  GFRAA 27*   < > 24* 26* 26*  ANIONGAP 8   < > 12 12 10    < > = values in this interval not displayed.     Hematology Recent Labs  Lab 05/02/20 1528  WBC 6.0  RBC 4.18*  HGB 11.9*  HCT 38.2*  MCV 91.4  MCH 28.5  MCHC 31.2  RDW 13.0  PLT 262    BNP Recent Labs  Lab 05/02/20 1528  BNP 740.3*     DDimer No results for input(s): DDIMER in the last 168 hours.   Radiology    NM Myocar Multi W/Spect W/Wall Motion / EF  Result Date: 05/07/2020  There was no ST segment deviation noted during stress.  No T wave inversion was noted during stress.  Findings consistent with prior myocardial infarction with peri-infarct ischemia.  The left ventricular ejection fraction is severely decreased (<30%).  No prior study for comparison.  There is significant perfusion defect of the entire anterior and inferior wall that is slightly worse at stress. However, does not appear to be severely abnormal, and distribution does not explain global hypokinesis. Likely infarct with small amount of worsening ischemia with stress, but cardiomyopathy out of proportion to ischemia/infarction.    Cardiac Studies   Echo  IMPRESSIONS   1. Left ventricular ejection fraction, by estimation, is <20%. The left   ventricle has severely decreased function. The left ventricle demonstrates  global hypokinesis. There is moderate concentric left ventricular  hypertrophy. Left ventricular diastolic  parameters are consistent with Grade I diastolic dysfunction (impaired  relaxation).  2. Right ventricular systolic function is mildly reduced. The right  ventricular size is normal. There is normal pulmonary artery systolic  pressure.  3. Left atrial size was mildly dilated.  4. The mitral valve is normal in structure. Trivial mitral valve  regurgitation. No evidence of mitral stenosis.  5. The aortic valve is tricuspid. Aortic valve regurgitation is not  visualized. No aortic stenosis is present.  6. Aortic dilatation noted. Aneurysm of the ascending aorta, measuring 41  mm. There is mild dilatation at the level of the sinuses of Valsalva  measuring 37 mm.  7. The inferior vena cava is dilated in size with >50% respiratory  variability, suggesting right atrial pressure of 8 mmHg.    Patient Profile     69 y.o. male with history of HTN, seizure disorder, DM2, polysubstance abuse (tobacco, alcohol and cocaine), hypertension, CKD stage 3-4, BPH, noncompliance (admitted in July but left AMA) who was admitted for confusion and encephalopathy being seen for acute CHF.   Assessment & Plan    1. Chronic systolic CHF: Echo 4/09 found to have EF 20%, G1DD. Etiology unclear. BNP elevated to 740. CXR showed cardiomegaly with mild central vascular congestion. Etiology unclear, though suspected non-ischemic. Patient appeared euvolemic and diuretics held 8/8 - - creatinine still up 2.90>2.7>2.76. Baseline around ?2.2. No new labs today.  -stress showed: "significant perfusion defect of the entire anterior and inferior wall that is slightly worse at stress. However, does not appear to be severely abnormal, and distribution does not explain global hypokinesis. Likely infarct with small amount of worsening ischemia  with stress, but cardiomyopathy out of proportion to ischemia/infarction." - findings suggest mixed cardiomyopathic process.  - limited options with renal failure: no ACE-I/ARB/ARNI, no MRA. Borderline candidate for SGLT2I with renal  failure.  - Continue coreg and Bidil - lasix has been held in setting of renal dysfunction, fortunately he appears euvolemic. Would anticipate restarting diuresis upon discharge.  2. 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 Limited ability to modify risk in setting of significant renal dysfunction. Would not commit to DAPT prior to probable neurosurgery. Can consider cath if patient is inclined after healed from cervical spine surgery.  The patient is high risk for intermediate risk procedure.  No further cardiovascular testing is required prior to the procedure.  If this level of risk is acceptable to the patient and surgical team, the patient should be considered optimized from a cardiovascular standpoint. Risk level discussed with patient, he understands, and is inclined to proceed with surgery. Aspirin can be stopped for 7 days prior to surgery, and should be restarted as soon as possible after surgery as dictated by surgical team.     Cardiology will sign off at this time as patient is awaiting placement. No further inpatient recommendations, however please feel free to call us prior to patient's discharge for final med recommendations.    For questions or updates, please contact [place] Please consult [email or web address] for contact info under   Elouise Munroe, MD 05/08/20 3:11 PM

## 2020-05-08 NOTE — Progress Notes (Signed)
PROGRESS NOTE  Tim Bradley  DOB: 04-09-1951  PCP: [facility] [id number]  DOA: 05/02/2020  LOS: 6 days   Chief Complaint  Patient presents with  . 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  Patient has also had weakness of the extremities and numbness of the lower extremities for over 1 year.  Apparently, patient was admitted in July 2021 for acute decompensated CHF and he left AMA at that time.   Patient presented to the ED on 8/4 with diarrhea, bilateral lower extremity edema, confusion and suspected seizure. Patient was admitted under hospitalist service for further assessment and management.   Consultations obtained with neurology, neurosurgery and cardiology.  Subjective: Patient was seen and examined this morning. Not in distress.  No new symptoms.  Seems frustrated waiting for bed availability.  States he can take care of himself at home which seems hard to agree with.    Assessment/Plan: Acute exacerbation of chronic combined systolic/diastolic congestive heart failure:  -Recent Echo on 04/11/2020 with ejection fraction of less than 16%, grade 1 diastolic dysfunction.    Etiology unclear.  Suspected known ischemic.  Stress test today. -He appeared volume overloaded on presentation with significant peripheral edema.   -BNP was elevated to 740.  Chest x-ray showed cardiomegaly with mild central vascular congestion.   -Cardiology was consulted. Patient was adequately diuresed with IV Lasix.  Currently not on diuresis.  He remains euvolemic. -Continue Coreg and BiDil.  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 -Above-mentioned findings noted in MRI. -Neurosurgery team consulted. Recommended outpatient surgery.. -Preoperative risk stratification per cardiology. -Underwent a stress test on 8/10.  No evidence of reversible ischemia.  Previous infarct noted.  Acute encephalopathy/suspected seizure episodes:  -Reported noncompliance to seizure medications. -Neurology consultation obtained. -EEG showed mild diffuse encephalopathy, no seizures or epileptiform discharges.   -MRI brain did not show any acute pathology. -Patient is currently on Keppra 500 mg p.o. twice daily and Depakote 500 MG p.o. twice daily.   Chronic alcohol abuse:  -No withdrawal symptoms reported.   -CIWA protocol.   -Continue thiamine, folate, multivitamin.    CKD stage 3-4:  Creatinine at baseline between 2-3. Currently stable at baseline.  Substance abuse: UDS positive for cocaine. Counseled.  Diet-controlled type 2 diabetes mellitus:  -Hemoglobin C 5.9 as per 7/21.   -Continue sliding scale insulin  BPH: On Flomax  Poor living condition/generalized weakness: Patient states he does not have any family members.  He cannot walk due to back pain, lower extremity pain.  PT and OT were consulted and he has been recommended skilled facility on discharge.  Social worker following.  He says he can take care of himself at home which is hard to agree with.    Mobility: As above Code Status:   Code Status: Full Code  Nutritional status: Body mass index is 27.26 kg/m.     Diet Order            Diet Heart Room service appropriate? Yes; Fluid consistency: Thin  Diet effective now  DVT prophylaxis: SCDs Start: 05/02/20 2024   Antimicrobials:  None Fluid: None  Consultants: Cardiology, neurosurgery Family Communication:  None at bedside  Status is: Inpatient  Remains  inpatient appropriate because:Ongoing diagnostic testing needed not appropriate for outpatient work up   Dispo: The patient is from: Home              Anticipated d/c is to: SNF              Anticipated d/c date is: When bed/insurance authorization is available.              Patient currently is medically stable to d/c.    Infusions:  . sodium chloride      Scheduled Meds: . aspirin EC  81 mg Oral Daily  . carvedilol  3.125 mg Oral BID WC  . divalproex  500 mg Oral Q12H  . folic acid  1 mg Oral Daily  . insulin aspart  0-5 Units Subcutaneous QHS  . insulin aspart  0-9 Units Subcutaneous TID WC  . isosorbide-hydrALAZINE  1 tablet Oral TID  . levETIRAcetam  500 mg Oral BID  . multivitamin with minerals  1 tablet Oral Daily  . sodium chloride flush  3 mL Intravenous Q12H  . tamsulosin  0.4 mg Oral QPC supper  . thiamine  100 mg Oral Daily   Or  . thiamine  100 mg Intravenous Daily    Antimicrobials: Anti-infectives (From admission, onward)   None      PRN meds: sodium chloride, acetaminophen, sodium chloride flush   Objective: Vitals:   05/08/20 0434 05/08/20 0850  BP: (!) 99/59 138/89  Pulse: 72 83  Resp: 16 16  Temp: 98.5 F (36.9 C) 98 F (36.7 C)  SpO2: 97% 96%    Intake/Output Summary (Last 24 hours) at 05/08/2020 1058 Last data filed at 05/08/2020 1039 Gross per 24 hour  Intake 666 ml  Output 1725 ml  Net -1059 ml   Filed Weights   05/06/20 0356 05/07/20 0500 05/08/20 0434  Weight: 97.9 kg 97.5 kg 96.3 kg   Weight change: -1.223 kg Body mass index is 27.26 kg/m.   Physical Exam: General exam: Appears calm and comfortable.  Not in distress Skin: No rashes, lesions or ulcers. HEENT: Atraumatic, normocephalic, supple neck, no obvious bleeding Lungs: Clear to auscultation bilaterally CVS: Regular rate and rhythm, no murmur GI/Abd soft, nontender, nondistended, bowel sound present  CNS: Alert, awake, oriented to place and person Psychiatry: Mood  appropriate Extremities: No pedal edema, no calf tenderness  Data Review: I have personally reviewed the laboratory data and studies available.  Recent Labs  Lab 05/02/20 1528  WBC 6.0  NEUTROABS 3.6  HGB 11.9*  HCT 38.2*  MCV 91.4  PLT 262   Recent Labs  Lab 05/02/20 1528 05/02/20 2025 05/03/20 0441 05/04/20 0516 05/05/20 0026 05/06/20 0557 05/07/20 0327  NA   < >  --  140 141 140 142 140  K   < >  --  3.9 4.0 3.9 3.8 4.0  CL   < >  --  102 103 103 105 106  CO2   < >  --  27 27 25 25 24   GLUCOSE   < >  --  152* 101* 87 117* 92  BUN   < >  --  54* 57* 60* 56* 54*  CREATININE   < >  --  2.75* 3.01* 2.90* 2.70* 2.76*  CALCIUM   < >  --  8.7* 8.6* 8.5* 8.7* 8.6*  MG  --  2.4  --   --   --   --   --   PHOS  --   --  4.2  --   --   --   --    < > = values in this interval not displayed.   Lab Results  Component Value Date   HGBA1C 5.9 (H) 04/22/2020    No results found for: CHOL, TRIG, HDL, CHOLHDL, VLDL, LDLCALC, LDLDIRECT, LABVLDL  Signed, Terrilee Croak, MD [facility] Pager: [phone] (Secure Chat preferred). 05/08/2020

## 2020-05-09 LAB — BASIC METABOLIC PANEL
Anion gap: 9 (ref 5–15)
BUN: 50 mg/dL — ABNORMAL HIGH (ref 8–23)
CO2: 24 mmol/L (ref 22–32)
Calcium: 8.7 mg/dL — ABNORMAL LOW (ref 8.9–10.3)
Chloride: 107 mmol/L (ref 98–111)
Creatinine, Ser: 2.64 mg/dL — ABNORMAL HIGH (ref 0.61–1.24)
GFR calc Af Amer: 27 mL/min — ABNORMAL LOW (ref >60)
GFR calc non Af Amer: 23 mL/min — ABNORMAL LOW (ref >60)
Glucose, Bld: 109 mg/dL — ABNORMAL HIGH (ref 70–99)
Potassium: 4.1 mmol/L (ref 3.5–5.1)
Sodium: 140 mmol/L (ref 135–145)

## 2020-05-09 LAB — CBC WITH DIFFERENTIAL/PLATELET
Abs Immature Granulocytes: 0.04 10*3/uL (ref 0.00–0.07)
Basophils Absolute: 0 10*3/uL (ref 0.0–0.1)
Basophils Relative: 1 %
Eosinophils Absolute: 0.2 10*3/uL (ref 0.0–0.5)
Eosinophils Relative: 4 %
HCT: 34.5 % — ABNORMAL LOW (ref 39.0–52.0)
Hemoglobin: 10.7 g/dL — ABNORMAL LOW (ref 13.0–17.0)
Immature Granulocytes: 1 %
Lymphocytes Relative: 29 %
Lymphs Abs: 1.4 10*3/uL (ref 0.7–4.0)
MCH: 28.2 pg (ref 26.0–34.0)
MCHC: 31 g/dL (ref 30.0–36.0)
MCV: 91 fL (ref 80.0–100.0)
Monocytes Absolute: 0.5 10*3/uL (ref 0.1–1.0)
Monocytes Relative: 11 %
Neutro Abs: 2.6 10*3/uL (ref 1.7–7.7)
Neutrophils Relative %: 54 %
Platelets: 228 10*3/uL (ref 150–400)
RBC: 3.79 MIL/uL — ABNORMAL LOW (ref 4.22–5.81)
RDW: 13.2 % (ref 11.5–15.5)
WBC: 4.8 10*3/uL (ref 4.0–10.5)
nRBC: 0 % (ref 0.0–0.2)

## 2020-05-09 LAB — SARS CORONAVIRUS 2 BY RT PCR (HOSPITAL ORDER, PERFORMED IN ~~LOC~~ HOSPITAL LAB): SARS Coronavirus 2: NEGATIVE

## 2020-05-09 LAB — GLUCOSE, CAPILLARY
Glucose-Capillary: 106 mg/dL — ABNORMAL HIGH (ref 70–99)
Glucose-Capillary: 104 mg/dL — ABNORMAL HIGH (ref 70–99)

## 2020-05-09 MED ORDER — FOLIC ACID 1 MG PO TABS: 1.0000 mg | ORAL_TABLET | Freq: Every day | ORAL | Status: DC

## 2020-05-09 MED ORDER — THIAMINE HCL 100 MG PO TABS: 100.0000 mg | ORAL_TABLET | Freq: Every day | ORAL | Status: DC

## 2020-05-09 MED ORDER — ADULT MULTIVITAMIN W/MINERALS CH: 1.0000 | ORAL_TABLET | Freq: Every day | ORAL | Status: DC

## 2020-05-09 MED ORDER — ACETAMINOPHEN 325 MG PO TABS: 650.0000 mg | ORAL_TABLET | ORAL | Status: DC | PRN

## 2020-05-09 MED ORDER — DIVALPROEX SODIUM 500 MG PO DR TAB: 500.0000 mg | DELAYED_RELEASE_TABLET | Freq: Two times a day (BID) | ORAL | Status: DC

## 2020-05-09 MED ORDER — FUROSEMIDE 40 MG PO TABS
40.0000 mg | ORAL_TABLET | Freq: Every day | ORAL | 0 refills | Status: DC | PRN
Start: 2020-05-09 — End: 2020-10-25

## 2020-05-09 MED ORDER — HEPARIN SODIUM (PORCINE) 5000 UNIT/ML IJ SOLN: 5000.0000 [IU] | Freq: Three times a day (TID) | INTRAMUSCULAR | Status: DC

## 2020-05-09 NOTE — TOC Transition Note (Signed)
Transition of Care [facility]) - CM/SW Discharge Note   Patient Details  Name: Tim Bradley MRN: [id number] Date of Birth: [birth year]  [facility] CM/SW Contact:  [name], [place] Phone Number: 05/09/2020, 12:54 PM   Clinical Narrative:     Patient will DC to: [facility]  Anticipated DC date:05/09/2020  Family notified: Actuary by: [name]  ?  Per MD patient ready for DC to [facility] . RN, patient, patient's family, and facility notified of DC. Discharge Summary sent to facility. RN given number for report tele# [phone]. DC packet on chart. Ambulance transport requested for patient.  CSW signing off.   Final next level of care: Skilled Nursing Facility Barriers to Discharge: No Barriers Identified   Patient Goals and CMS Choice Patient states their goals for this hospitalization and ongoing recovery are:: to go to SNF CMS Medicare.gov Compare Post Acute Care list provided to:: Patient Choice offered to / list presented to : Patient  Discharge Placement              Patient [facility] bed at: [facility] Patient to be transferred to facility by: [place] Name of family member notified: [name] Patient and family notified of of transfer: 05/09/20  Discharge Plan and Services                                     Social Determinants of Health ([place]) Interventions     Readmission Risk Interventions Readmission Risk Prevention Plan 07/24/2019  Transportation Screening Complete  PCP or Specialist Appt within 3-5 Days Complete  HRI or [place] Complete  Social Work Consult for [place] Planning/Counseling Complete  Palliative Care Screening Not Applicable  Medication Review Press photographer) Complete  Some recent data might be hidden

## 2020-05-09 NOTE — TOC Progression Note (Signed)
Transition of Care [facility]) - Progression Note    Patient Details  Name: Tim Bradley MRN: [id number] Date of Birth: August 17, 1951  Transition of Care [facility]) CM/SW [place], [place] Phone Number: 05/09/2020, 12:47 PM  Clinical Narrative:     CSW spoke with Suanne Marker with [facility] and she can accept patient for SNF placement.   Patient has SNF Bed.   CSW will continue to follow.  Expected Discharge Plan: Skilled Nursing Facility Barriers to Discharge: Active Substance Use - Placement, Continued Medical Work up  Expected Discharge Plan and Services Expected Discharge Plan: [place] arrangements for the past 2 months: Single Family Home Expected Discharge Date: 05/09/20                                     Social Determinants of Health (SDOH) Interventions    Readmission Risk Interventions Readmission Risk Prevention Plan 07/24/2019  Transportation Screening Complete  PCP or Specialist Appt within 3-5 Days Complete  HRI or [place] Complete  Social Work Consult for [place] Planning/Counseling Complete  Palliative Care Screening Not Applicable  Medication Review Press photographer) Complete  Some recent data might be hidden

## 2020-05-09 NOTE — Progress Notes (Signed)
Attempted to give report to [facility].  Unable to reach nurse station.  Idolina Primer, RN

## 2020-05-09 NOTE — Discharge Summary (Signed)
Physician Discharge Summary  Tim Bradley WPY:[id number] DOB: 10-Jul-1951 DOA: 05/02/2020  PCP: [facility]  Admit date: 05/02/2020 Discharge date: 05/09/2020  Admitted From: Home Discharge disposition: SNF   Code Status: Full Code  Diet Recommendation: Cardiac diet  Discharge Diagnosis:   Principal Problem:   Acute on chronic congestive heart failure ([place]) Active Problems:   Generalized weakness   Acute encephalopathy   Diarrhea   QT prolongation   Pressure injury of skin  History of Present Illness / Brief narrative:  Patient is a 69 year old male withpast medical history significant forpolysubstance abuse(alcohol/cocaine/ marijuana/tobacco);anemia, systolic/diastolic congestive heart failure with ejection fraction of less than 20% as per echo done on 04/11/2020, CKD stage III-4, type 2 diabetes, BPH, hypertensionand seizure disorder. Patient has also had weakness of the extremities and numbness of the lower extremities for over 1 year. Apparently, patient was admittedin July 2021 for acute decompensated CHF and he left AMA at that time.  Patient presented to the ED on 8/4 withdiarrhea, bilateral lower extremity edema, confusion and suspected seizure. Patient was admitted under hospitalist service for further assessment and management.  Consultations obtained with neurology, neurosurgery and cardiology.  Hospital Course:   Acute exacerbation of chronic combined systolic/diastolic congestive heart failure Coronary artery disease. -Recent Echo on 04/11/2020 with ejection fraction of less than 05%, grade 1 diastolic dysfunction.  -He appeared volume overloaded on presentation with significant peripheral edema. -BNP was elevated to 740.  Chest x-ray showed cardiomegaly with mild central vascular congestion.  -Cardiology was consulted. Patient was adequately diuresed with IV Lasix.  Currently not on diuresis.  He remains euvolemic. -Unclear etiology of  cardiomyopathy.    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  Underwent Lexiscan on 8/9. Per cardiology, patient has significant perfusion defect of the entire anterior and inferior wall that is slightly worse at stress. However, does not appear to be severely abnormal, and distribution does not explain global hypokinesis. Likely infarct with small amount of worsening ischemia with stress, but cardiomyopathy out of proportion to ischemia/infarction. -Continue Coreg, BiDil and aspirin 81 mg daily.    Cervical myelopathy due to cervical stenosis at C5-6, C7-T1 with cord compression Thoracic stenosis with cord compression, T10-11 with cord signal change -Patient reported frequent falls prior to presentation. -Above-mentioned findings noted in MRI. -Neurosurgery team consulted. Recommended outpatient surgery.. -Preoperative risk stratification per cardiology. -Per cardiology, patient is high risk for intermediate risk procedure.  No further cardiovascular testing is required prior to the procedure.  If this level of risk is acceptable to the patient and surgical team, the patient should be considered optimized from a cardiovascular standpoint. Risk level discussed with patient, he understands, and is inclined to proceed with surgery.  -Per cardiology, aspirin can be stopped for 7 days prior to surgery and should be restarted as soon as possible after surgery as directed by surgical team.  Acute encephalopathy/suspected seizure episodes: -Reported noncompliance to seizure medications. -Neurology consultation obtained. -EEG showed mild diffuse encephalopathy, no seizures or epileptiform discharges. -MRI brain did not show any acute pathology. -Patient is currently on Keppra 500 mg p.o. twice daily and Depakote 500 MG p.o. twice daily.   Chronic alcohol abuse: -No withdrawal symptoms in the hospital.. -Continue thiamine, folate, multivitamin.   CKD stage 3-4:  Creatinine at  baseline between 2-3. Currently stable at baseline. Recent Labs    04/24/20 0414 04/25/20 3976 04/26/20 1112 05/02/20 1528 05/03/20 0441 05/04/20 0516 05/05/20 0026 05/06/20 0557 05/07/20 0327 05/09/20 0524  CREATININE 2.64*  2.26* 2.50* 2.61* 2.75* 3.01* 2.90* 2.70* 2.76* 2.64*    Substance abuse:UDS positive for cocaine. Counseled to quit.  Diet-controlled type 2 diabetes mellitus: -Hemoglobin C 5.9 as per 7/21.  -Continue sliding scale insulin  BPH:On Flomax  Poor living condition/generalized weakness: Patient states he does not have any family members. He cannot walk due to back pain, lower extremity pain. PT and OT were consulted and he has been recommended skilled facility on discharge.   Wound care: Pressure Injury 05/03/20 Buttocks Right Stage 2 -  Partial thickness loss of dermis presenting as a shallow open injury with a red, pink wound bed without slough. (Active)  05/03/20 0055  Location: Buttocks  Location Orientation: Right  Staging: Stage 2 -  Partial thickness loss of dermis presenting as a shallow open injury with a red, pink wound bed without slough.  Wound Description (Comments):   Present on Admission: Yes    Subjective:  Seen and examined this morning.  [name].  Not in distress.  Waiting for SNF bed availability.  Discharge Exam:   Vitals:   05/08/20 1554 05/08/20 2026 05/09/20 0534 05/09/20 0915  BP: 115/60 129/66 107/61 130/73  Pulse: 72 71 74 78  Resp: 16 18 18    Temp: 98 F (36.7 C) 98.8 F (37.1 C) 98.6 F (37 C)   TempSrc: Oral Oral Oral   SpO2: 98% 100% 98%   Weight:   98.3 kg   Height:        Body mass index is 27.82 kg/m.  General exam: Appears calm and comfortable.  Not in physical distress Skin: No rashes, lesions or ulcers. HEENT: Atraumatic, normocephalic, supple neck, no obvious bleeding Lungs: Clear to auscultation bilaterally CVS: Regular rate and rhythm, no murmur GI/Abd soft,  nontender, nondistended, bowel sound present CNS: Alert, awake, oriented to place and person Psychiatry: Mood appropriate Extremities: No pedal edema, no calf tenderness  Follow ups:   Discharge Instructions    Call MD for:  temperature >100.4   Complete by: As directed    Discharge wound care:   Complete by: As directed    Dry dressing   Increase activity slowly   Complete by: As directed       [facility].   Specialty: General Practice Contact information: [place] [id number] [phone]        Josue Hector, MD .   Specialty: Cardiology Contact information: [phone] [address] [address] [phone]        Dawley, Pieter Partridge C, DO Follow up.   Why: Neurosurgery. Contact information: [address] [place] [address] [phone]               Recommendations for Outpatient Follow-Up:   1. Follow-up outpatient with neurosurgery, primary care cardiology.  Discharge Instructions:  Follow with Primary MD [place] in 7 days   Get CBC/BMP checked in next visit within 1 week by PCP or SNF MD ( we routinely change or add medications that can affect your baseline labs and fluid status, therefore we recommend that you get the mentioned basic workup next visit with your PCP, your PCP may decide not to get them or add new tests based on their clinical decision)  On your next visit with your PCP, please Get Medicines reviewed and adjusted.  Please request your PCP  to go over all Hospital Tests and Procedure/Radiological results  at the follow up, please get all Hospital records sent to your Prim MD by signing hospital release before you go home.  Activity: As tolerated with Full fall precautions use walker/cane & assistance as needed  For Heart failure patients - Check your Weight same time everyday, if you gain over 2 pounds, or you develop in leg  swelling, experience more shortness of breath or chest pain, call your Primary MD immediately. Follow Cardiac Low Salt Diet and 1.5 lit/day fluid restriction.  If you have smoked or chewed Tobacco in the last 2 yrs please stop smoking, stop any regular Alcohol  and or any Recreational drug use.  If you experience worsening of your admission symptoms, develop shortness of breath, life threatening emergency, suicidal or homicidal thoughts you must seek medical attention immediately by calling 911 or calling your MD immediately  if symptoms less severe.  You Must read complete instructions/literature along with all the possible adverse reactions/side effects for all the Medicines you take and that have been prescribed to you. Take any new Medicines after you have completely understood and accpet all the possible adverse reactions/side effects.   Do not drive, operate heavy machinery, perform activities at heights, swimming or participation in water activities or provide baby sitting services if your were admitted for syncope or siezures until you have seen by Primary MD or a Neurologist and advised to do so again.  Do not drive when taking Pain medications.  Do not take more than prescribed Pain, Sleep and Anxiety Medications  Wear Seat belts while driving.   Please note You were cared for by a hospitalist during your hospital stay. If you have any questions about your discharge medications or the care you received while you were in the hospital after you are discharged, you can call the unit and asked to speak with the hospitalist on call if the hospitalist that took care of you is not available. Once you are discharged, your primary care physician will handle any further medical issues. Please note that NO REFILLS for any discharge medications will be authorized once you are discharged, as it is imperative that you return to your primary care physician (or establish a relationship with a primary care  physician if you do not have one) for your aftercare needs so that they can reassess your need for medications and monitor your lab values.    Allergies as of 05/09/2020   No Known Allergies     Medication List    TAKE these medications   acetaminophen 325 MG tablet Commonly known as: TYLENOL Take 2 tablets (650 mg total) by mouth every 4 (four) hours as needed for headache or mild pain.   aspirin 81 MG EC tablet Take 1 tablet (81 mg total) by mouth daily. Swallow whole.   carvedilol 3.125 MG tablet Commonly known as: COREG Take 1 tablet (3.125 mg total) by mouth 2 (two) times daily with a meal.   divalproex 500 MG DR tablet Commonly known as: DEPAKOTE Take 1 tablet (500 mg total) by mouth every 12 (twelve) hours.   folic acid 1 MG tablet Commonly known as: FOLVITE Take 1 tablet (1 mg total) by mouth daily. Start taking on: May 10, 2020   furosemide 40 MG tablet Commonly known as: LASIX Take 1 tablet (40 mg total) by mouth daily as needed for fluid or edema. What changed:   when to take this  reasons to take this   isosorbide-hydrALAZINE 20-37.5 MG tablet Commonly known as: BIDIL  Take 1 tablet by mouth 3 (three) times daily.   levETIRAcetam 500 MG tablet Commonly known as: Keppra Take 1 tablet (500 mg total) by mouth 2 (two) times daily.   multivitamin with minerals Tabs tablet Take 1 tablet by mouth daily. Start taking on: May 10, 2020   tamsulosin 0.4 MG Caps capsule Commonly known as: FLOMAX Take 1 capsule (0.4 mg total) by mouth daily after supper.   thiamine 100 MG tablet Take 1 tablet (100 mg total) by mouth daily. Start taking on: May 10, 2020            Discharge Care Instructions  (From admission, onward)         Start     Ordered   05/09/20 0000  Discharge wound care:       Comments: Dry dressing   05/09/20 1027          Time coordinating discharge: 35 minutes  The results of significant diagnostics from this  hospitalization (including imaging, microbiology, ancillary and laboratory) are listed below for reference.    Procedures and Diagnostic Studies:   CT HEAD WO CONTRAST  Result Date: 05/02/2020 CLINICAL DATA:  Follow-up subarachnoid hemorrhage EXAM: CT HEAD WITHOUT CONTRAST TECHNIQUE: Contiguous axial images were obtained from the base of the skull through the vertex without intravenous contrast. COMPARISON:  CT 05/02/2020, 04/13/2020 FINDINGS: Brain: Stable appearance of the hyperattenuating focus along the mesial aspect of the left frontal lobe in the region of the cingulate gyrus. Corresponds well to a similar hyperattenuating focus on the most recent and more remote comparison images likely reflecting some chronic thrombus or plaque within the left A2 segment of the anterior cerebral artery. No new sites of hemorrhage are identified. No evidence of acute infarction, hydrocephalus, extra-axial collection or mass lesion/mass effect. Patchy areas of white matter hypoattenuation are most compatible with chronic microvascular angiopathy. Symmetric prominence of the ventricles, cisterns and sulci compatible with parenchymal volume loss. Vascular: Hyperdensity in the region of the left A2 segment remains stable. Additional calcification in the carotid siphons. Skull: No significant scalp swelling or hematoma. Benign-appearing dermal calcifications. No acute or suspicious osseous lesions. Sinuses/Orbits: Minimal mural thickening in the ethmoid air cells. Paranasal sinuses and mastoid air cells are otherwise predominantly clear. Included orbital structures are unremarkable. Other: None IMPRESSION: 1. Stable appearance of the hyperattenuating focus along the mesial aspect of the left frontal lobe in the region of the cingulate gyrus. Corresponds well to a similar hyperattenuating focus on the most recent as well as more remote comparison images likely reflecting some chronic thrombus or plaque within the left A2  segment of the anterior cerebral artery. 2. No new sites of hemorrhage are identified. 3. Stable parenchymal volume loss and chronic microvascular angiopathy. Electronically Signed   By: Lovena Le M.D.   On: 05/02/2020 23:35   CT Head Wo Contrast  Addendum Date: 05/02/2020   ADDENDUM REPORT: 05/02/2020 20:34 ADDENDUM: Case discussed with Dr. Lorraine Lax. The hyperattenuating focus along the cingulate gyrus corresponds to an A2 branch of the anterior cerebral artery and is unchanged compared to 04/13/2020. This may represent chronic thrombus or hyperdense atherosclerotic plaque within the ACA. Electronically Signed   By: Ulyses Jarred M.D.   On: 05/02/2020 20:34   Result Date: 05/02/2020 CLINICAL DATA:  Confusion, found on ground EXAM: CT HEAD WITHOUT CONTRAST TECHNIQUE: Contiguous axial images were obtained from the base of the skull through the vertex without intravenous contrast. COMPARISON:  CT 04/13/2020 FINDINGS: Brain: Small amount of  hyperdensity is present along surface of the mesial left frontal lobe, along the cingulate gyrus and sulcus of the corpus callosum. No other acute intracranial hyperdensity or hemorrhage is seen. No CT evidence of acute large territory infarct. No mass effect or midline shift. No evidence of hydrocephaly. Basal cisterns are patent. Symmetric prominence of the ventricles, cisterns and sulci compatible with parenchymal volume loss. Patchy areas of white matter hypoattenuation are most compatible with chronic microvascular angiopathy. Vascular: Atherosclerotic calcification of the carotid siphons. No hyperdense vessel. Skull: No significant scalp swelling or hematoma. No subjacent calvarial fractures within the limitations of motion artifact. No visible facial bone fractures within the margins of imaging. Sinuses/Orbits: Mild thickening in the ethmoids. Small benign osteoma present in the left ethmoids. Remaining paranasal sinuses and mastoids are predominantly clear. Middle ear  cavities are clear. Included orbital structures are unremarkable. Other: None IMPRESSION: 1. Small amount of hyperdensity along the mesial surface of the left frontal lobe, along the cingulate gyrus and sulcus of the corpus callosum. Findings are concerning for a small amount of subarachnoid hemorrhage in the setting of potentially unwitnessed fall. 2. No significant scalp swelling or thickening. No acute osseous injury. 3. Background of parenchymal volume loss, chronic microvascular angiopathy and intracranial atherosclerosis. Critical Value/emergent results were called by telephone at the time of interpretation on 05/02/2020 at 4:54 pm to provider Dunlap , who verbally acknowledged these results. Electronically Signed: By: Lovena Le M.D. On: 05/02/2020 16:55   MR BRAIN WO CONTRAST  Result Date: 05/03/2020 CLINICAL DATA:  Seizure, abnormal neuro exam. EXAM: MRI HEAD WITHOUT CONTRAST TECHNIQUE: Multiplanar, multiecho pulse sequences of the brain and surrounding structures were obtained without intravenous contrast. COMPARISON:  Head CT 05/02/2020, brain MRI 07/21/2019. FINDINGS: Brain: The examination is intermittently motion degraded. Most notably, there is mild motion degradation of the axial T2/FLAIR sequence, mild motion degradation of the axial T1 weighted sequence, mild motion degradation of the coronal and coronal T2/FLAIR sequences oriented perpendicular to the long axis of the hippocampi and mild motion degradation of the coronal T2 weighted whole brain sequence. Mild generalized parenchymal atrophy. Moderate scattered T2/FLAIR hyperintensity within the cerebral white matter is nonspecific, but consistent with chronic small vessel ischemic disease. These findings are stable as compared to the prior MRI of 07/21/2019. The hippocampi are symmetric in size and signal. There is no acute infarct. No evidence of intracranial mass. No chronic intracranial blood products. No extra-axial fluid collection. No  midline shift. Vascular: Expected proximal arterial flow voids. Skull and upper cervical spine: No focal marrow lesion. Susceptibility artifact from cervical spinal fusion hardware. Sinuses/Orbits: Visualized orbits show no acute finding. Mild ethmoid sinus mucosal thickening at the imaged levels. No significant mastoid effusion. IMPRESSION: Motion degraded examination as described. No evidence of acute intracranial abnormality. No specific seizure focus is identified. Mild generalized parenchymal atrophy and moderate cerebral white matter chronic small vessel ischemic changes, stable as compared to the MRI of 07/21/2019. Mild ethmoid sinus mucosal thickening. Electronically Signed   By: Kellie Simmering DO   On: 05/03/2020 20:09   DG Chest Portable 1 View  Result Date: 05/02/2020 CLINICAL DATA:  69 year old male with heart failure. Diarrhea. EXAM: PORTABLE CHEST 1 VIEW COMPARISON:  Chest radiograph dated 04/22/2020 FINDINGS: There is cardiomegaly with mild central vascular congestion. No focal consolidation, pleural effusion, or pneumothorax. Atherosclerotic calcification of the aortic arch. No acute osseous pathology. IMPRESSION: Cardiomegaly with mild central vascular congestion. No focal consolidation. Electronically Signed   By: Anner Crete  M.D.   On: 05/02/2020 15:44   EEG adult  Result Date: 05/03/2020 Lora Havens, MD     05/03/2020  9:59 AM Patient Name: Tim Bradley MRN: [id number] Epilepsy Attending: Lora Havens Referring Physician/Provider: Dr. Blanchie Dessert Date: 05/02/2020 Duration: 24.48 minutes Patient history: 69 year old male with history of polysubstance abuse who presented after witnessed seizure-like activity.  EEG evaluate for seizures. Level of alertness: Awake, asleep AEDs during EEG study: Keppra Technical aspects: This EEG study was done with scalp electrodes positioned according to the 10-20 International system of electrode placement. Electrical activity was acquired at a  sampling rate of 500Hz  and reviewed with a high frequency filter of 70Hz  and a low frequency filter of 1Hz . EEG data were recorded continuously and digitally stored. Description: During awake state, no clear posterior dominant rhythm was seen.  Sleep was characterized by vertex waves, sleep spindles (12 to 14 Hz), maximal frontocentral region.  EEG showed continuous generalized 5 to 6 Hz theta slowing.  Hyperventilation and photic stimulation were not performed.   ABNORMALITY -Continuous slow, generalized IMPRESSION: This study is suggestive of mild diffuse encephalopathy, nonspecific etiology. No seizures or epileptiform discharges were seen throughout the recording. [place]:   Basic Metabolic Panel: Recent Labs  Lab 05/02/20 1528 05/02/20 2025 05/03/20 0441 05/03/20 0441 05/04/20 4742 05/04/20 5956 05/05/20 0026 05/05/20 0026 05/06/20 0557 05/06/20 0557 05/07/20 0327 05/09/20 0524  NA   < >  --  140   < > 141  --  140  --  142  --  140 140  K   < >  --  3.9   < > 4.0   < > 3.9   < > 3.8   < > 4.0 4.1  CL   < >  --  102   < > 103  --  103  --  105  --  106 107  CO2   < >  --  27   < > 27  --  25  --  25  --  24 24  GLUCOSE   < >  --  152*   < > 101*  --  87  --  117*  --  92 109*  BUN   < >  --  54*   < > 57*  --  60*  --  56*  --  54* 50*  CREATININE   < >  --  2.75*   < > 3.01*  --  2.90*  --  2.70*  --  2.76* 2.64*  CALCIUM   < >  --  8.7*   < > 8.6*  --  8.5*  --  8.7*  --  8.6* 8.7*  MG  --  2.4  --   --   --   --   --   --   --   --   --   --   PHOS  --   --  4.2  --   --   --   --   --   --   --   --   --    < > = values in this interval not displayed.   GFR Estimated Creatinine Clearance: 29.8 mL/min (A) (by C-G formula based on SCr of 2.64 mg/dL (H)). Liver Function Tests: Recent Labs  Lab 05/02/20 1528  AST 33  ALT 20  ALKPHOS 59  BILITOT 0.5  PROT 5.9*  ALBUMIN 3.1*   Recent Labs  Lab 05/02/20 1528  LIPASE 25   Recent Labs  Lab  05/02/20 1855  AMMONIA 32   Coagulation profile No results for input(s): INR, PROTIME in the last 168 hours.  CBC: Recent Labs  Lab 05/02/20 1528 05/09/20 0524  WBC 6.0 4.8  NEUTROABS 3.6 2.6  HGB 11.9* 10.7*  HCT 38.2* 34.5*  MCV 91.4 91.0  PLT 262 228   Cardiac Enzymes: No results for input(s): CKTOTAL, CKMB, CKMBINDEX, TROPONINI in the last 168 hours. BNP: Invalid input(s): POCBNP CBG: Recent Labs  Lab 05/08/20 0819 05/08/20 1227 05/08/20 1557 05/08/20 2227 05/09/20 0802  GLUCAP 79 83 85 118* 106*   D-Dimer No results for input(s): DDIMER in the last 72 hours. Hgb A1c No results for input(s): HGBA1C in the last 72 hours. Lipid Profile No results for input(s): CHOL, HDL, LDLCALC, TRIG, CHOLHDL, LDLDIRECT in the last 72 hours. Thyroid function studies No results for input(s): TSH, T4TOTAL, T3FREE, THYROIDAB in the last 72 hours.  Invalid input(s): FREET3 Anemia work up No results for input(s): VITAMINB12, FOLATE, FERRITIN, TIBC, IRON, RETICCTPCT in the last 72 hours. Microbiology Recent Results (from the past 240 hour(s))  Culture, Urine     Status: None   Collection Time: 05/02/20  4:10 PM   Specimen: Urine, Random  Result Value Ref Range Status   Specimen Description URINE, RANDOM  Final   Special Requests NONE  Final   Culture   Final    NO GROWTH Performed at [facility], [address] [place], [place] [address]    Report Status 05/04/2020 FINAL  Final  SARS Coronavirus 2 by RT PCR (hospital order, performed in [facility] hospital lab) Nasopharyngeal     Status: None   Collection Time: 05/02/20  4:23 PM   Specimen: Nasopharyngeal  Result Value Ref Range Status   SARS Coronavirus 2 NEGATIVE NEGATIVE Final    Comment: (NOTE) SARS-CoV-2 target nucleic acids are NOT DETECTED.  The SARS-CoV-2 RNA is generally detectable in upper and lower respiratory specimens during the acute phase of infection. The lowest concentration of SARS-CoV-2  viral copies this assay can detect is 250 copies / mL. A negative result does not preclude SARS-CoV-2 infection and should not be used as the sole basis for treatment or other patient management decisions.  A negative result may occur with improper specimen collection / handling, submission of specimen other than nasopharyngeal swab, presence of viral mutation(s) within the areas targeted by this assay, and inadequate number of viral copies (<250 copies / mL). A negative result must be combined with clinical observations, patient history, and epidemiological information.  Fact Sheet for Patients:   StrictlyIdeas.no  Fact Sheet for Healthcare Providers: BankingDealers.co.za  This test is not yet approved or  cleared by the [place] FDA and has been authorized for detection and/or diagnosis of SARS-CoV-2 by FDA under an Emergency Use Authorization (EUA).  This EUA will remain in effect (meaning this test can be used) for the duration of the COVID-19 declaration under Section 564(b)(1) of the Act, 21 U.S.C. section 360bbb-3(b)(1), unless the authorization is terminated or revoked sooner.  Performed at [facility], [place] [address], [place], [place] [address]      Signed: Terrilee Croak  [facility] 05/09/2020, 10:27 AM

## 2020-05-09 NOTE — Progress Notes (Signed)
   Providing Compassionate, Quality Care - Together  NEUROSURGERY PROGRESS NOTE   S: No issues overnight.   O: EXAM:  BP 125/69 (BP Location: Left Arm)   Pulse 75   Temp 98.2 F (36.8 C) (Oral)   Resp 18   Ht 6\' 2"  (1.88 m)   Wt 98.3 kg   SpO2 99%   BMI 27.82 kg/m   Awake alert oriented x3 Pupils equally round reactive light Face symmetric Bilateral interossei wasting, Bilateral upper extremities 4 out of 5 strength Bilateral lower extremities 4 out of 5 strength No clonus Positive Hoffmann's bilateral Sensory light touch slightly decreased in lower extremities bilaterally   ASSESSMENT:  69 y.o. 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  He has high risk for the intermediate procedure. -He needs to be off aspirin for 7 days prior to the surgery.  Therefore I recommend he go to rehab and follow-up with me as an outpatient. -I also counseled him on needing to be clean from cocaine in order to undergo surgery as it is already high risk from a cardiovascular standpoint. -He acknowledged this and agrees. -We will sign off at this time, he will follow up with me in 2 weeks.   Thank you for allowing me to participate in this patient's care.  Please do not hesitate to call with questions or concerns.   Elwin Sleight, [facility] Cell: [phone]

## 2020-05-26 ENCOUNTER — Ambulatory Visit (HOSPITAL_COMMUNITY)
Admission: EM | Admit: 2020-05-26 | Discharge: 2020-05-26 | Disposition: A | Discharge status: Other Acute Inpatient Hospital | Payer: Medicare Other | Attending: Psychiatry | Admitting: Psychiatry

## 2020-05-26 ENCOUNTER — Emergency Department (HOSPITAL_COMMUNITY)
Admission: EM | Admit: 2020-05-26 | Discharge: 2020-05-26 | Disposition: A | Discharge status: Home / Self Care | Payer: No Typology Code available for payment source | Attending: Emergency Medicine | Admitting: Emergency Medicine

## 2020-05-26 ENCOUNTER — Encounter (HOSPITAL_COMMUNITY): Payer: Self-pay

## 2020-05-26 ENCOUNTER — Other Ambulatory Visit: Payer: Self-pay

## 2020-05-26 DIAGNOSIS — Y999 Unspecified external cause status: Secondary | ICD-10-CM | POA: Insufficient documentation

## 2020-05-26 DIAGNOSIS — Y9389 Activity, other specified: Secondary | ICD-10-CM | POA: Diagnosis not present

## 2020-05-26 DIAGNOSIS — Z5321 Procedure and treatment not carried out due to patient leaving prior to being seen by health care provider: Secondary | ICD-10-CM | POA: Insufficient documentation

## 2020-05-26 DIAGNOSIS — M791 Myalgia, unspecified site: Secondary | ICD-10-CM | POA: Diagnosis not present

## 2020-05-26 DIAGNOSIS — M549 Dorsalgia, unspecified: Secondary | ICD-10-CM | POA: Diagnosis not present

## 2020-05-26 DIAGNOSIS — F4321 Adjustment disorder with depressed mood: Secondary | ICD-10-CM | POA: Insufficient documentation

## 2020-05-26 DIAGNOSIS — W1811XA Fall from or off toilet without subsequent striking against object, initial encounter: Secondary | ICD-10-CM | POA: Insufficient documentation

## 2020-05-26 DIAGNOSIS — W1839XA Other fall on same level, initial encounter: Secondary | ICD-10-CM | POA: Diagnosis not present

## 2020-05-26 DIAGNOSIS — Y92002 Bathroom of unspecified non-institutional (private) residence single-family (private) house as the place of occurrence of the external cause: Secondary | ICD-10-CM | POA: Diagnosis not present

## 2020-05-26 DIAGNOSIS — Z0389 Encounter for observation for other suspected diseases and conditions ruled out: Secondary | ICD-10-CM | POA: Diagnosis not present

## 2020-05-26 DIAGNOSIS — Y929 Unspecified place or not applicable: Secondary | ICD-10-CM | POA: Diagnosis not present

## 2020-05-26 DIAGNOSIS — Y939 Activity, unspecified: Secondary | ICD-10-CM | POA: Diagnosis not present

## 2020-05-26 NOTE — ED Triage Notes (Addendum)
Pt comes via [facility] for fall, denies pain, pt was unable to get off the toilet, pt became upset with EMS and expressed SI thoughts, states that he wants to shoot himself in the head, pt now denies SI thoughts and states he only said it cause he was unset. Pt is unable to walk. Pt was seen at [facility] and psych cleared and EMS was unable to transport home.

## 2020-05-26 NOTE — ED Notes (Signed)
Pt called for vitals x3. 

## 2020-05-26 NOTE — BH Assessment (Signed)
Comprehensive Clinical Assessment (CCA) Screening, Triage and Referral Note  05/26/2020 Tim Bradley [id number]  Patient is a 69 y.o. male with no past psychiatric history who presents voluntarily via EMS to [facility] after making a concerning statement to EMS medic.  Patient has had onset of numbness/weakness causing significant mobility problems over the past 9 months. This is related to a back injury patient sustained in the [employer].   He lives with his girlfriend, who supports patient.  Patient also has support from family and friends.  Patient admits to making a statement about "blowing my brains out" when EMS arrived.  On assessment, patient adamantly denies SI, and states he shouldn't have made this statement and didn't realize the significance of this statement and response by EMS to seek evaluation. He denies history of attempts and he denies any significant depression at this time.  He does admit to struggling with this mobility change and struggle to care for basic needs, but states he would never harm himself.  He laughs as he states he is planning on getting some [employer] when he gets home. He does not appear depressed.  He gives verbal consent for LPC to contact his mother for collateral and to get his new phone number for the phone he left at his home with his girlfriend.  Patient's mother denies safety concerns, however she is concerned that patient may need in home care or assisted living.  She fears patient's girlfriend is not able to help patient with mobility, and worries about her getting hurt trying to assist.   Per patient's girlfriend, Crystal([phone]), patient has no history of mental health or substance abuse concerns.  She was surprised to wake up and find EMS at her home.  She states patient called when he couldn't get up from the commode.  She also notes patient had made comments about his close friends not answering his calls today.  She feels "him getting  stuck" and not having his friends' support earlier "got to him."  She believes patient just "rattled off that statement and wouldn't harm himself."  She confirms patient owns a weapon. She plans to secure the weapon where patient can't reach it and does so while on the phone with this Probation officer.  Crystal also reports patient has been working with the [facility] to request home health services.  She states there has been confusion, as there are several agencies, providers and family working on this referral.  She plans to continue to support patient with this referral process.    Per Marvia Pickles, NP patient is psychiatrically cleared for discharge home.  He plans to follow up with the [facility] and is aware of resources/services offered there.  Patient will need assistance with transfer home by EMS, as family are unable to pick patient up.    Visit Diagnosis:    ICD-10-CM   1. Adjustment disorder with depressed mood  F43.21     Patient Reported Information How did you hear about [place]? Self   Referral name: Patient presents via EMS after he contacted them.   Referral phone number: No data recorded Whom do you see for routine medical problems? I don't have a doctor   Practice/Facility Name: No data recorded  Practice/Facility Phone Number: No data recorded  Name of Contact: No data recorded  Contact Number: No data recorded  Contact Fax Number: No data recorded  Prescriber Name: No data recorded  Prescriber Address (if known): No data recorded What Is the  Reason for Your Visit/Call Today? Patient presents via EMS after he made a suicidal statement to EMS medic.  How Long Has This Been Causing You Problems? 1 wk - 1 month  Have You Recently Been in Any Inpatient Treatment (Hospital/Detox/Crisis Center/28-Day Program)? No   Name/Location of Program/Hospital:No data recorded  How Long Were You There? No data recorded  When Were You Discharged? No data recorded Have You Ever Received Services From [facility]  Before? No   Who Do You See at [facility]? No data recorded Have You Recently Had Any Thoughts About Hurting Yourself? No   Are You Planning to Commit Suicide/Harm Yourself At This time?  No  Have you Recently Had Thoughts About [place]? No   Explanation: No data recorded Have You Used Any Alcohol or Drugs in the Past 24 Hours? No   How Long Ago Did You Use Drugs or Alcohol?  No data recorded  What Did You Use and How Much? No data recorded What Do You Feel Would Help You the Most Today? Assessment Only  Do You Currently Have a Therapist/Psychiatrist? No   Name of Therapist/Psychiatrist: No data recorded  Have You Been Recently Discharged From Any Office Practice or Programs? No   Explanation of Discharge From Practice/Program:  No data recorded    CCA Screening Triage Referral Assessment Type of Contact: Face-to-Face   Is this Initial or Reassessment? No data recorded  Date Telepsych consult ordered in [facility]:  No data recorded  Time Telepsych consult ordered in [facility]:  No data recorded Patient Reported Information Reviewed? Yes   Patient Left Without Being Seen? No data recorded  Reason for Not Completing Assessment: No data recorded Collateral Involvement: Collateral provided by patient's significant other, Crystal [phone])  Does Patient Have a [place]? No data recorded  Name and Contact of Legal Guardian:  No data recorded If Minor and Not Living with Parent(s), Who has Custody? No data recorded Is CPS involved or ever been involved? Never  Is APS involved or ever been involved? Never  Patient Determined To Be At Risk for Harm To Self or Others Based on Review of Patient Reported Information or Presenting Complaint? No   Method: No data recorded  Availability of Means: No data recorded  Intent: No data recorded  Notification Required: No data recorded  Additional Information for Danger to Others Potential:  No data  recorded  Additional Comments for Danger to Others Potential:  No data recorded  Are There Guns or Other Weapons in Your Home?  No data recorded   Types of Guns/Weapons: No data recorded   Are These Weapons Safely Secured?                              No data recorded   Who Could Verify You Are Able To Have These Secured:    No data recorded Do You Have any Outstanding Charges, Pending Court Dates, Parole/Probation? No data recorded Contacted To Inform of Risk of Harm To Self or Others: No data recorded Location of Assessment: [facility]  Does Patient Present under Involuntary Commitment? No   IVC Papers Initial File Date: No data recorded  [place] of Residence: [place]  Patient Currently Receiving the Following Services: Not Receiving Services   Determination of Need: Routine (7 days)   Options For Referral: Other: Comment (Patient aware of [facility] resources)   Fransico Meadow

## 2020-05-26 NOTE — ED Provider Notes (Signed)
Behavioral Health Medical Screening Exam  Tim Bradley is a 68 y.o. male.  Patient presents voluntarily to the [facility] via EMS.  There is reported that the patient was at his home and he has a severe injury from [place] and he had fallen off of the toilet.  He states that he had contacted EMS to help him get up and while EMS was there he had made a comment about going to the back room and blowing his brains out.  He also reported that there was a weapon in the house.  EMS brought him to the [facility] for an evaluation.  However prior to the patient arriving to the [facility] had already reported that he was not suicidal and that he was just frustrated with his chronic disability.  Patient continued to deny that he was not suicidal nor homicidal and denied any hallucinations.  Patient did admit to having a firearm in the house but states that it is put away and he never uses it.  He reports that he lives at home with his girlfriend Tim Bradley and that we can contact her for collateral.  TTS is able to get in touch with his girlfriend Tim Bradley, she reports that the patient is not a threat to himself or to anyone else.  She states that she does not want [facility] and she will remove it so that it is not accessible to the patient to be on the safe side.  She states that he has had this before because he gets frustrated with his disability.  She states that he has never attempted to harm himself or harm anyone else.  He states that they are in the process of trying to get him additional home health to assist with his needs and this is been discouraging to him. Patient is psychiatric cleared with collateral.  However patient is nonambulatory and had to remain in the stretcher during the evaluation.  I discussed with EMS to provide transportation for the patient home and they stated that they could not do that.  They stated that they need to take him somewhere in the closest hospital is [facility] emergency department.  Patient will  be transferred to [facility] emergency department.  I have notified Dr. Gilford Raid of the situation and informed her that patient has been psych cleared and he does not meet any criteria for IVC or for inpatient psychiatric treatment.  Notified her that the patient would need nonemergent EMS to transport him home as he is not ambulatory and his family has no transportation to pick him up.  Total Time spent with patient: 20 minutes  Psychiatric Specialty Exam  Presentation  General Appearance:Appropriate for Environment;Casual  Eye Contact:Good  Speech:Clear and Coherent;Normal Rate  Speech Volume:Normal  Handedness:Right   Mood and Affect  Mood:Euthymic  Affect:Congruent;Appropriate   Thought Process  Thought Processes:Coherent  Descriptions of Associations:Intact  Orientation:Full (Time, Place and Person)  Thought Content:WDL  Hallucinations:None  Ideas of Reference:None  Suicidal Thoughts:No  Homicidal Thoughts:No   Sensorium  Memory:Immediate Good;Recent Good;Remote Good  Judgment:Fair  Insight:Fair   Executive Functions  Concentration:Good  Attention Span:Good  [place] of Knowledge:Good  Language:Good   Psychomotor Activity  Psychomotor Activity:Normal   Assets  Assets:Communication Skills;Desire for Improvement;Financial Resources/Insurance;Housing;Social Support   Sleep  Sleep:No data recorded Number of hours: No data recorded  Physical Exam: Physical Exam Vitals and nursing note reviewed.  Constitutional:      Appearance: He is well-developed.  Cardiovascular:     Rate and Rhythm: Normal rate.  Pulmonary:     Effort: Pulmonary effort is normal.  Skin:    General: Skin is warm.  Neurological:     Mental Status: He is alert and oriented to person, place, and time.    Review of Systems  Constitutional: Negative.   HENT: Negative.   Eyes: Negative.   Respiratory: Negative.   Cardiovascular: Negative.    Gastrointestinal: Negative.   Genitourinary: Negative.   Musculoskeletal: Positive for back pain, falls and myalgias.  Skin: Negative.   Neurological: Negative.   Endo/Heme/Allergies: Negative.   Psychiatric/Behavioral: Negative.    Blood pressure 118/80, pulse 100, temperature 98 F (36.7 C), resp. rate (!) 22, SpO2 95 %. There is no height or weight on file to calculate BMI.  Musculoskeletal: Strength & Muscle Tone: within normal limits Gait & Station: normal Patient leans: N/A   Recommendations:  Based on my evaluation the patient does not appear to have an emergency medical condition.  However patient is nonambulatory and there are no appropriate beds for this patient.  Per EMS they would have to take the patient over to [facility] emergency department and they will have to assist patient with transportation home.  Lewis Shock, FNP 05/26/2020, 6:49 PM

## 2020-05-29 ENCOUNTER — Encounter (HOSPITAL_COMMUNITY): Payer: Self-pay | Admitting: Emergency Medicine

## 2020-05-29 ENCOUNTER — Emergency Department (HOSPITAL_COMMUNITY): Payer: No Typology Code available for payment source

## 2020-05-29 ENCOUNTER — Emergency Department (HOSPITAL_COMMUNITY)
Admission: EM | Admit: 2020-05-29 | Discharge: 2020-05-30 | Disposition: A | Discharge status: Home / Self Care | Payer: No Typology Code available for payment source | Attending: Emergency Medicine | Admitting: Emergency Medicine

## 2020-05-29 DIAGNOSIS — R569 Unspecified convulsions: Secondary | ICD-10-CM | POA: Diagnosis present

## 2020-05-29 DIAGNOSIS — Z79899 Other long term (current) drug therapy: Secondary | ICD-10-CM | POA: Insufficient documentation

## 2020-05-29 DIAGNOSIS — N184 Chronic kidney disease, stage 4 (severe): Secondary | ICD-10-CM | POA: Diagnosis not present

## 2020-05-29 DIAGNOSIS — E119 Type 2 diabetes mellitus without complications: Secondary | ICD-10-CM | POA: Insufficient documentation

## 2020-05-29 DIAGNOSIS — I130 Hypertensive heart and chronic kidney disease with heart failure and stage 1 through stage 4 chronic kidney disease, or unspecified chronic kidney disease: Secondary | ICD-10-CM | POA: Diagnosis not present

## 2020-05-29 DIAGNOSIS — Z7982 Long term (current) use of aspirin: Secondary | ICD-10-CM | POA: Insufficient documentation

## 2020-05-29 DIAGNOSIS — I5043 Acute on chronic combined systolic (congestive) and diastolic (congestive) heart failure: Secondary | ICD-10-CM | POA: Diagnosis not present

## 2020-05-29 DIAGNOSIS — F17210 Nicotine dependence, cigarettes, uncomplicated: Secondary | ICD-10-CM | POA: Diagnosis not present

## 2020-05-29 HISTORY — DX: Unspecified convulsions: R56.9

## 2020-05-29 LAB — CBC WITH DIFFERENTIAL/PLATELET
Abs Immature Granulocytes: 0.03 10*3/uL (ref 0.00–0.07)
Basophils Absolute: 0 10*3/uL (ref 0.0–0.1)
Basophils Relative: 1 %
Eosinophils Absolute: 0.2 10*3/uL (ref 0.0–0.5)
Eosinophils Relative: 3 %
HCT: 41.5 % (ref 39.0–52.0)
Hemoglobin: 13 g/dL (ref 13.0–17.0)
Immature Granulocytes: 1 %
Lymphocytes Relative: 15 %
Lymphs Abs: 1 10*3/uL (ref 0.7–4.0)
MCH: 29 pg (ref 26.0–34.0)
MCHC: 31.3 g/dL (ref 30.0–36.0)
MCV: 92.4 fL (ref 80.0–100.0)
Monocytes Absolute: 0.7 10*3/uL (ref 0.1–1.0)
Monocytes Relative: 11 %
Neutro Abs: 4.6 10*3/uL (ref 1.7–7.7)
Neutrophils Relative %: 69 %
Platelets: 251 10*3/uL (ref 150–400)
RBC: 4.49 MIL/uL (ref 4.22–5.81)
RDW: 13.6 % (ref 11.5–15.5)
WBC: 6.6 10*3/uL (ref 4.0–10.5)
nRBC: 0 % (ref 0.0–0.2)

## 2020-05-29 LAB — COMPREHENSIVE METABOLIC PANEL
ALT: 15 U/L (ref 0–44)
AST: 18 U/L (ref 15–41)
Albumin: 3.5 g/dL (ref 3.5–5.0)
Alkaline Phosphatase: 62 U/L (ref 38–126)
Anion gap: 8 (ref 5–15)
BUN: 40 mg/dL — ABNORMAL HIGH (ref 8–23)
CO2: 25 mmol/L (ref 22–32)
Calcium: 8.7 mg/dL — ABNORMAL LOW (ref 8.9–10.3)
Chloride: 106 mmol/L (ref 98–111)
Creatinine, Ser: 2.52 mg/dL — ABNORMAL HIGH (ref 0.61–1.24)
GFR calc Af Amer: 29 mL/min — ABNORMAL LOW (ref >60)
GFR calc non Af Amer: 25 mL/min — ABNORMAL LOW (ref >60)
Glucose, Bld: 109 mg/dL — ABNORMAL HIGH (ref 70–99)
Potassium: 4.4 mmol/L (ref 3.5–5.1)
Sodium: 139 mmol/L (ref 135–145)
Total Bilirubin: 0.5 mg/dL (ref 0.3–1.2)
Total Protein: 7 g/dL (ref 6.5–8.1)

## 2020-05-29 LAB — CBG MONITORING, ED: Glucose-Capillary: 130 mg/dL — ABNORMAL HIGH (ref 70–99)

## 2020-05-29 LAB — BRAIN NATRIURETIC PEPTIDE: B Natriuretic Peptide: 887.3 pg/mL — ABNORMAL HIGH (ref 0.0–100.0)

## 2020-05-29 LAB — TROPONIN I (HIGH SENSITIVITY): Troponin I (High Sensitivity): 12 ng/L (ref <18)

## 2020-05-29 MED ORDER — LORAZEPAM 2 MG/ML IJ SOLN
1.0000 mg | Freq: Once | INTRAMUSCULAR | Status: AC
  Administered 2020-05-29: 1 mg via INTRAVENOUS
  Filled 2020-05-29: qty 1

## 2020-05-29 MED ORDER — LEVETIRACETAM IN NACL 1000 MG/100ML IV SOLN
1000.0000 mg | Freq: Once | INTRAVENOUS | Status: AC
  Administered 2020-05-29: 1000 mg via INTRAVENOUS
  Filled 2020-05-29: qty 100

## 2020-05-29 MED ORDER — DIVALPROEX SODIUM 500 MG PO DR TAB
2000.0000 mg | DELAYED_RELEASE_TABLET | Freq: Once | ORAL | Status: AC
  Administered 2020-05-29: 2000 mg via ORAL
  Filled 2020-05-29: qty 4

## 2020-05-29 MED ORDER — DIVALPROEX SODIUM 500 MG PO DR TAB
500.0000 mg | DELAYED_RELEASE_TABLET | Freq: Once | ORAL | Status: AC
  Administered 2020-05-29: 500 mg via ORAL
  Filled 2020-05-29: qty 1

## 2020-05-29 NOTE — ED Provider Notes (Addendum)
Tim Bradley   CSN: [id number] Arrival date & time: 05/29/20  1359     History Chief Complaint  Patient presents with  . Seizures    Tim Bradley is a 69 y.o. male with past medical history of CHF, CKD, cocaine abuse, diabetes, hypertension, seizures that presents to the emergency department today for seizure via EMS.  Patient states that he has been out of his seizure medication for the past 4 days.  Tim Bradley Bradley states that he had witnessed episode of seizure this morning.  Patient states he does not live with his family.  Did try calling the sister who was on patient's chart, who did not answer.  States that he feels fine, states that he has been partying and doing a lot of cocaine.  Does not complain of any pain anywhere.  No chest pain, shortness of breath, confusion, headache, dizziness, neck pain, tingling, paresthesias, weakness, fatigue, fevers, chills, URI-like symptoms, nausea, vomiting, abdominal pain, back pain.  States that he did have incontinent episode this morning.  Did not his head or lose consciousness.  Denies any other drug use.  Denies any alcohol use.  Per chart review patient was discharged about 20 days ago for acute encephalopathy, suspected seizure episodes.  He had EEG which showed mild diffuse encephalopathy with no seizures or epileptiform discharges.  Negative MRI brain.  Patient currently on Keppra 500 twice daily and Depakote 500 twice daily.  HPI     Past Medical History:  Diagnosis Date  . Abscess 02/2017   LEFT GLUTEAL   . Acid reflux   . Alcohol abuse   . Anemia   . CHF (congestive heart failure) ([place])   . 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 Cocaine abuse ([place])   . DDD (degenerative disc disease), lumbar   . Diabetes mellitus without complication ([place])   . Dyspnea   . Enlarged prostate   . Hypertension   . Seizures ([place])   . Spinal stenosis, lumbar      Patient Active Problem List   Diagnosis Date Noted  . Pressure injury of skin 05/03/2020  . Acute encephalopathy 05/02/2020  . Diarrhea 05/02/2020  . QT prolongation 05/02/2020  . Acute combined systolic and diastolic congestive heart failure ([place]) 04/22/2020  . Essential hypertension 04/11/2020  . Leg edema 04/11/2020  . Dyspnea 04/11/2020  . Acute on chronic congestive heart failure ([place]) 04/11/2020  . BPH (benign prostatic hyperplasia) 04/11/2020  . 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 Generalized weakness 04/11/2020  . Seizure ([place]) 07/21/2019  . Cocaine abuse ([place])   . Constipation 08/22/2018  . Gastritis 08/20/2018  . Heme positive stool   . Acute blood loss anemia secondary to massive gastric ulcer   . Hyponatremia   . Abscess, gluteal, left 03/26/2017  . Type 2 diabetes mellitus ([place]) 03/26/2017  . Adrenal nodule ([place]) 03/26/2017  . Closed fracture of body of thoracic vertebra ([place]) 03/26/2017  . Spinal stenosis of lumbar region 03/26/2017    Past Surgical History:  Procedure Laterality Date  . BACK SURGERY     cervical x2  . BIOPSY  08/21/2018   Procedure: BIOPSY;  Surgeon: Ronald Lobo, MD;  Location: [facility];  Service: Endoscopy;;  . ESOPHAGOGASTRODUODENOSCOPY (EGD) WITH PROPOFOL N/A 08/21/2018   Procedure: ESOPHAGOGASTRODUODENOSCOPY (EGD) WITH PROPOFOL;  Surgeon: Ronald Lobo, MD;  Location: [facility];  Service: Endoscopy;  Laterality: N/A;  .  INCISION AND DRAINAGE PERIRECTAL ABSCESS N/A 03/26/2017   Procedure: IRRIGATION AND DEBRIDEMENT PERIRECTAL ABSCESS;  Surgeon: Rolm Bookbinder, MD;  Location: [facility];  Service: General;  Laterality: N/A;       Family History  Problem Relation Age of Onset  . Hypertension Mother   . Cancer - Lung Father     Social History   Tobacco Use  . Smoking status: Current Every Day Smoker    Packs/day: 0.25    Types: Cigarettes  . Smokeless tobacco: Never Used   Vaping Use  . Vaping Use: Never used  Substance Use Topics  . Alcohol use: Yes    Alcohol/week: 12.0 standard drinks    Types: 12 Cans of beer per week    Comment: i can of beer daily  . Drug use: Yes    Types: Cocaine, Marijuana    Comment: 03/2020     Home Medications Prior to Admission medications   Medication Sig Start Date End Date Taking? Authorizing Provider  acetaminophen (TYLENOL) 325 MG tablet Take 2 tablets (650 mg total) by mouth every 4 (four) hours as needed for headache or mild pain. 05/09/20   Terrilee Croak, MD  aspirin EC 81 MG EC tablet Take 1 tablet (81 mg total) by mouth daily. Swallow whole. 04/14/20   Raiford Noble Latif, DO  carvedilol (COREG) 3.125 MG tablet Take 1 tablet (3.125 mg total) by mouth 2 (two) times daily with a meal. 04/13/20   Sheikh, Omair Latif, DO  divalproex (DEPAKOTE) 500 MG DR tablet Take 1 tablet (500 mg total) by mouth every 12 (twelve) hours. 05/09/20   Terrilee Croak, MD  folic acid (FOLVITE) 1 MG tablet Take 1 tablet (1 mg total) by mouth daily. 05/10/20   Terrilee Croak, MD  furosemide (LASIX) 40 MG tablet Take 1 tablet (40 mg total) by mouth daily as needed for fluid or edema. 05/09/20   Dahal, Marlowe Aschoff, MD  isosorbide-hydrALAZINE (BIDIL) 20-37.5 MG tablet Take 1 tablet by mouth 3 (three) times daily. 04/13/20   Raiford Noble Latif, DO  levETIRAcetam (KEPPRA) 500 MG tablet Take 1 tablet (500 mg total) by mouth 2 (two) times daily. 03/19/20   Corena Herter, PA-C  Multiple Vitamin (MULTIVITAMIN WITH MINERALS) TABS tablet Take 1 tablet by mouth daily. 05/10/20   Terrilee Croak, MD  tamsulosin (FLOMAX) 0.4 MG CAPS capsule Take 1 capsule (0.4 mg total) by mouth daily after supper. 01/27/20   Deno Etienne, DO  thiamine 100 MG tablet Take 1 tablet (100 mg total) by mouth daily. 05/10/20   Terrilee Croak, MD    Allergies    Patient has no known allergies.  Review of Systems   Review of Systems  Constitutional: Negative for chills, diaphoresis, fatigue and  fever.  HENT: Negative for congestion, sore throat and trouble swallowing.   Eyes: Negative for pain and visual disturbance.  Respiratory: Negative for cough, shortness of breath and wheezing.   Cardiovascular: Negative for chest pain, palpitations and leg swelling.  Gastrointestinal: Negative for abdominal distention, abdominal pain, diarrhea, nausea and vomiting.  Genitourinary: Negative for difficulty urinating.  Musculoskeletal: Negative for back pain, neck pain and neck stiffness.  Skin: Negative for pallor.  Neurological: Positive for seizures. Negative for dizziness, tremors, syncope, facial asymmetry, speech difficulty, weakness, light-headedness, numbness and headaches.  Psychiatric/Behavioral: Negative for agitation, behavioral problems and confusion.    Physical Exam Updated Vital Signs BP (!) 148/84 (BP Location: Left Arm)   Pulse 90   Temp 98.6 F (37 C) (Oral)  Resp (!) 24   SpO2 96%   Physical Exam Constitutional:      General: He is not in acute distress.    Appearance: Normal appearance. He is not ill-appearing, toxic-appearing or diaphoretic.  HENT:     Head: Normocephalic and atraumatic.     Mouth/Throat:     Mouth: Mucous membranes are moist.     Pharynx: Oropharynx is clear.  Eyes:     General: No scleral icterus.    Extraocular Movements: Extraocular movements intact.     Pupils: Pupils are equal, round, and reactive to light.  Cardiovascular:     Rate and Rhythm: Normal rate and regular rhythm.     Pulses: Normal pulses.     Heart sounds: Normal heart sounds.  Pulmonary:     Effort: Pulmonary effort is normal. No respiratory distress.     Breath sounds: Normal breath sounds. No stridor. No wheezing, rhonchi or rales.  Chest:     Chest wall: No tenderness.  Abdominal:     General: Abdomen is flat. There is no distension.     Palpations: Abdomen is soft.     Tenderness: There is no abdominal tenderness. There is no guarding or rebound.   Musculoskeletal:        General: No swelling or tenderness. Normal range of motion.     Cervical back: Normal range of motion and neck supple. No rigidity.     Right lower leg: No edema.     Left lower leg: No edema.  Skin:    General: Skin is warm and dry.     Capillary Refill: Capillary refill takes less than 2 seconds.     Coloration: Skin is not pale.  Neurological:     General: No focal deficit present.     Mental Status: He is alert and oriented to person, place, and time.     Comments: Alert and oriented x3. Clear speech. No facial droop. CNIII-XII grossly intact. Bilateral upper and lower extremities' sensation grossly intact. 5/5 symmetric strength with grip strength and with plantar and dorsi flexion bilaterally. Normal finger to nose bilaterally. Negative pronator drift.   Psychiatric:        Mood and Affect: Mood normal.        Behavior: Behavior normal.     ED Results / Procedures / Treatments   Labs (all labs ordered are listed, but only abnormal results are displayed) Labs Reviewed  COMPREHENSIVE METABOLIC PANEL - Abnormal; Notable for the following components:      Result Value   Glucose, Bld 109 (*)    BUN 40 (*)    Creatinine, Ser 2.52 (*)    Calcium 8.7 (*)    GFR calc non Af Amer 25 (*)    GFR calc Af Amer 29 (*)    All other components within normal limits  CBG MONITORING, ED - Abnormal; Notable for the following components:   Glucose-Capillary 130 (*)    All other components within normal limits  CBC WITH DIFFERENTIAL/PLATELET    EKG None  Radiology No results found.  Procedures Procedures (including critical care time)  Medications Ordered in ED Medications  divalproex (DEPAKOTE) DR tablet 500 mg (has no administration in time range)  divalproex (DEPAKOTE) DR tablet 500 mg (500 mg Oral Given 05/29/20 1540)    ED Course  I have reviewed the triage vital signs and the nursing notes.  Pertinent labs & imaging results that were available  during my care of the patient were  reviewed by me and considered in my medical decision making (see chart for details).    MDM Rules/Calculators/A&P                         Tim Bradley is a 69 y.o. male with past medical history of CHF, CKD, cocaine abuse, diabetes, hypertension, seizures that presents to the emergency department today for seizure via EMS. Patient is at baseline with  normal neuro exam, unable to assess gait since patient does not walk at baseline. Unable to reach family at this time. QTC prolonged to 526 therefore will not give loading dose of Keprra at this time.  Did give 1 g of Depakote.  Did discuss to the patient that he needs to get his medications from the [place], patient agreeable states he will do this later tomorrow. Will give 2 days worth of depakote to take home.  Did also discuss that patient needs to talk to the [place] about Keppra since he does take this daily, patient agreeable to go over this very thoroughly and write this in dispo information.  Upon reassessment patient still with normal neuro exam.  Patient ready to go home.CBC and CMP stable.  Elevated creatinine, this appears to be patient's baseline.  Doubt need for further emergent work up at this time. I explained the diagnosis and have given explicit precautions to return to the ER including for any other new or worsening symptoms. The patient understands and accepts the medical plan as it's been dictated and I have answered their questions. Discharge instructions concerning home care and prescriptions have been given. The patient is STABLE and is discharged to home in good condition.  Patient's blood pressure elevated in the emergency department today. Patient denies headache, change in vision, numbness, weakness, chest pain, dyspnea, dizziness, or lightheadedness therefore doubt hypertensive emergency. Discussed elevated blood pressure with the patient and the need for primary care follow up with potential need to  initiate or change antihypertensive medications and or for further evaluation. Discussed return precaution signs/symptoms for hypertensive emergency as listed above with the patient. He confirmed understanding.    I discussed this case with my attending physician, Dr. Zenia Resides,  who cosigned this Bradley including patient's presenting symptoms, physical exam, and planned diagnostics and interventions. Attending physician stated agreement with plan or made changes to plan which were implemented.   Attending physician assessed patient at bedside.  Final Clinical Impression(s) / ED Diagnoses Final diagnoses:  Seizures [facility])    Rx / DC Orders ED Discharge Orders    None         Alfredia Client, PA-C 05/29/20 1851    Lacretia Leigh, MD 05/30/20 1216

## 2020-05-29 NOTE — Discharge Instructions (Addendum)
Take your Depakote starting tomorrow as you normally have.  Your work-up was reassuring today.  If you have any new or worsening concerning symptoms he is come back to the emergency department.  Your blood pressure was slightly elevated today, patient to follow-up with your primary care about this.  In regards to your medications you need to take your seizure medications as you are prescribed.  Your QTC was prolonged on your EKG today therefore we did not give you any Keppra.  Very important that you talk to the [place] about this before you get your Keppra.  Continue to follow seizure precautions, as includes no driving, showering alone, swimming, climbing ladders.  Please use the attached instructions.  Try and stay away from cocaine since this can be worsening your seizure threshold.

## 2020-05-29 NOTE — ED Provider Notes (Signed)
69 year old male received a sign out from [name] due to breakthrough seizure while awaiting PT or after the patient was discharged.  Per her HPI:   "Tim Bradley is a 69 y.o. male with past medical history of CHF, CKD, cocaine abuse, diabetes, hypertension, seizures that presents to the emergency department today for seizure via EMS.  Patient states that he has been out of his seizure medication for the past 4 days.  Tim Bradley note states that he had witnessed episode of seizure this morning.  Patient states he does not live with his family.  Did try calling the sister who was on patient's chart, who did not answer.  States that he feels fine, states that he has been partying and doing a lot of cocaine.  Does not complain of any pain anywhere.  No chest pain, shortness of breath, confusion, headache, dizziness, neck pain, tingling, paresthesias, weakness, fatigue, fevers, chills, URI-like symptoms, nausea, vomiting, abdominal pain, back pain.  States that he did have incontinent episode this morning.  Did not his head or lose consciousness.  Denies any other drug use.  Denies any alcohol use.  Per chart review patient was discharged about 20 days ago for acute encephalopathy, suspected seizure episodes.  He had EEG which showed mild diffuse encephalopathy with no seizures or epileptiform discharges.  Negative MRI brain.  Patient currently on Keppra 500 twice daily and Depakote 500 twice daily."   On my evaluation, patient is currently post-ictal. Level V caveat 2/2 altered mental status.   Physical Exam  BP (!) 153/89   Pulse 81   Temp 98.6 F (37 C) (Oral)   Resp 16   SpO2 98%   Physical Exam Vitals and nursing note reviewed.  Constitutional:      Appearance: He is well-developed.     Comments: Post-ictal  HENT:     Head: Normocephalic and atraumatic.  Cardiovascular:     Rate and Rhythm: Normal rate.     Pulses: Normal pulses.     Heart sounds: Normal heart sounds. No murmur heard.   No friction rub. No gallop.   Pulmonary:     Effort: Pulmonary effort is normal. No respiratory distress.     Breath sounds: No stridor. No wheezing, rhonchi or rales.  Chest:     Chest wall: No tenderness.  Abdominal:     General: There is no distension.     Comments: Abdomen is nondistended.  Normoactive bowel sounds.  Musculoskeletal:        General: No tenderness.     Cervical back: Neck supple.     Right lower leg: Edema present.     Left lower leg: Edema present.  Neurological:     Mental Status: He is alert.  Psychiatric:        Behavior: Behavior normal.     ED Course/Procedures     Procedures  MDM   69 year old male received a signout from [name] at shift change after the patient had a breakthrough seizure while awaiting PT or transportation to home.  Please see her note for further work-up and medical decision making.  Patient had noted to previous clinician that he had been using cocaine heavily over the last few days.  Previous seizures have been thought to be secondary to polysubstance use disorder. EKG was obtained and unchanged from previous.  Troponin is not elevated.  BNP is 887, not significantly elevated from previous and patient does not appear markedly volume overloaded.  On second reevaluation,  patient is no longer postictal.  He adamantly denies chest pain.  He has no complaints at this time.  Patient has received loading dose of Keppra and Ativan.  He was then observed for several hours in the ER without breakthrough seizure-like activity.  Per PA [name], patient will be discharged home with Depakote.  Medication has been given to the patient in the ER for home use prior to shift change per her plan and he will follow up with the [name] for refills.   After patient was made for discharge, he was observed for approximately 5-6 more hours without recurrent seizure-like activity.  He is hemodynamically stable and in no acute distress.  Safe for discharge to  home with outpatient follow-up as indicated.       [name], PA-C 05/30/20 0802    Carmin Muskrat, MD 05/31/20 1021

## 2020-05-29 NOTE — ED Notes (Signed)
Patient received peri-care/male purewick

## 2020-05-29 NOTE — ED Notes (Signed)
Seizure pads applied to bed.  

## 2020-05-29 NOTE — ED Provider Notes (Signed)
Medical screening examination/treatment/procedure(s) were conducted as a shared visit with non-physician practitioner(s) and myself.  I personally evaluated the patient during the encounter.     69 year old male presents here after having a seizure just prior to arrival.  Patient states he has not been compliant with his Depakote and Keppra.  On exam he is neurologically intact.  Will load with Depakote and Keppra and discharged   Lacretia Leigh, MD 05/29/20 1459

## 2020-05-29 NOTE — ED Provider Notes (Signed)
After patient had been discharged he had been sitting waiting for [facility] for over 4 hours was reported by nursing staff that patient had another seizure. Tonic clonic for 30 seconds. Did discuss with Dr. Vanita Panda and Dr. Alvino Chapel that we will give Keppra and Ativan at this time and to monitor patient's QTC.  Patient was not given a full loading dose, therefore will reassess after Keppra and Ativan given.  Upon reassessment, patient is postictal. Is confused. Vital stable. Does respond to external stimuli.  Pt care was handed off to M.Mcdonald PA-C at 955.  Complete history and physical and current plan have been communicated.  Please refer to their note for the remainder of ED care and ultimate disposition. Patient just received medications, will have him reassessed with neuro checks. If patient seizes again will most likely need admission.    Alfredia Client, PA-C 05/29/20 2203    Davonna Belling, MD 05/29/20 [id number]

## 2020-05-29 NOTE — ED Triage Notes (Signed)
Per EMS-states witnessed seizure by family-states he has been out of seizure meds for 1 week-did not fall, did not hit head

## 2020-05-30 NOTE — ED Notes (Signed)
[facility] called for transport.  

## 2020-07-01 ENCOUNTER — Encounter (HOSPITAL_COMMUNITY): Payer: Self-pay

## 2020-07-01 ENCOUNTER — Emergency Department (HOSPITAL_COMMUNITY)
Admission: EM | Admit: 2020-07-01 | Discharge: 2020-07-01 | Disposition: A | Discharge status: Home / Self Care | Payer: No Typology Code available for payment source | Attending: Emergency Medicine | Admitting: Emergency Medicine

## 2020-07-01 ENCOUNTER — Emergency Department (HOSPITAL_COMMUNITY): Payer: No Typology Code available for payment source

## 2020-07-01 DIAGNOSIS — E1122 Type 2 diabetes mellitus with diabetic chronic kidney disease: Secondary | ICD-10-CM | POA: Insufficient documentation

## 2020-07-01 DIAGNOSIS — Z79899 Other long term (current) drug therapy: Secondary | ICD-10-CM | POA: Diagnosis not present

## 2020-07-01 DIAGNOSIS — I130 Hypertensive heart and chronic kidney disease with heart failure and stage 1 through stage 4 chronic kidney disease, or unspecified chronic kidney disease: Secondary | ICD-10-CM | POA: Diagnosis not present

## 2020-07-01 DIAGNOSIS — R569 Unspecified convulsions: Secondary | ICD-10-CM | POA: Diagnosis not present

## 2020-07-01 DIAGNOSIS — I5041 Acute combined systolic (congestive) and diastolic (congestive) heart failure: Secondary | ICD-10-CM | POA: Insufficient documentation

## 2020-07-01 DIAGNOSIS — N184 Chronic kidney disease, stage 4 (severe): Secondary | ICD-10-CM | POA: Diagnosis not present

## 2020-07-01 DIAGNOSIS — Z7982 Long term (current) use of aspirin: Secondary | ICD-10-CM | POA: Diagnosis not present

## 2020-07-01 DIAGNOSIS — F17210 Nicotine dependence, cigarettes, uncomplicated: Secondary | ICD-10-CM | POA: Diagnosis not present

## 2020-07-01 LAB — CBC WITH DIFFERENTIAL/PLATELET
Abs Immature Granulocytes: 0.02 10*3/uL (ref 0.00–0.07)
Basophils Absolute: 0 10*3/uL (ref 0.0–0.1)
Basophils Relative: 1 %
Eosinophils Absolute: 0.1 10*3/uL (ref 0.0–0.5)
Eosinophils Relative: 2 %
HCT: 42.4 % (ref 39.0–52.0)
Hemoglobin: 13.4 g/dL (ref 13.0–17.0)
Immature Granulocytes: 0 %
Lymphocytes Relative: 13 %
Lymphs Abs: 0.8 10*3/uL (ref 0.7–4.0)
MCH: 28.8 pg (ref 26.0–34.0)
MCHC: 31.6 g/dL (ref 30.0–36.0)
MCV: 91 fL (ref 80.0–100.0)
Monocytes Absolute: 0.5 10*3/uL (ref 0.1–1.0)
Monocytes Relative: 8 %
Neutro Abs: 4.6 10*3/uL (ref 1.7–7.7)
Neutrophils Relative %: 76 %
Platelets: 239 10*3/uL (ref 150–400)
RBC: 4.66 MIL/uL (ref 4.22–5.81)
RDW: 13.1 % (ref 11.5–15.5)
WBC: 6 10*3/uL (ref 4.0–10.5)
nRBC: 0 % (ref 0.0–0.2)

## 2020-07-01 LAB — COMPREHENSIVE METABOLIC PANEL
ALT: 16 U/L (ref 0–44)
AST: 22 U/L (ref 15–41)
Albumin: 2.9 g/dL — ABNORMAL LOW (ref 3.5–5.0)
Alkaline Phosphatase: 54 U/L (ref 38–126)
Anion gap: 10 (ref 5–15)
BUN: 30 mg/dL — ABNORMAL HIGH (ref 8–23)
CO2: 22 mmol/L (ref 22–32)
Calcium: 9 mg/dL (ref 8.9–10.3)
Chloride: 102 mmol/L (ref 98–111)
Creatinine, Ser: 2.28 mg/dL — ABNORMAL HIGH (ref 0.61–1.24)
GFR calc Af Amer: 32 mL/min — ABNORMAL LOW (ref >60)
GFR calc non Af Amer: 28 mL/min — ABNORMAL LOW (ref >60)
Glucose, Bld: 114 mg/dL — ABNORMAL HIGH (ref 70–99)
Potassium: 4 mmol/L (ref 3.5–5.1)
Sodium: 134 mmol/L — ABNORMAL LOW (ref 135–145)
Total Bilirubin: 0.9 mg/dL (ref 0.3–1.2)
Total Protein: 6.1 g/dL — ABNORMAL LOW (ref 6.5–8.1)

## 2020-07-01 LAB — MAGNESIUM: Magnesium: 1.8 mg/dL (ref 1.7–2.4)

## 2020-07-01 LAB — VALPROIC ACID LEVEL: Valproic Acid Lvl: <10 ug/mL — ABNORMAL LOW (ref 50.0–100.0)

## 2020-07-01 LAB — ETHANOL: Alcohol, Ethyl (B): <10 mg/dL (ref <10)

## 2020-07-01 MED ORDER — LEVETIRACETAM 500 MG PO TABS: 500.0000 mg | ORAL_TABLET | Freq: Two times a day (BID) | ORAL | 1 refills | Status: DC

## 2020-07-01 MED ORDER — DIVALPROEX SODIUM 500 MG PO DR TAB
500.0000 mg | DELAYED_RELEASE_TABLET | Freq: Two times a day (BID) | ORAL | 1 refills | Status: DC
Start: 2020-07-01 — End: 2020-07-17

## 2020-07-01 MED ORDER — LEVETIRACETAM IN NACL 1000 MG/100ML IV SOLN
1000.0000 mg | Freq: Once | INTRAVENOUS | Status: AC
  Administered 2020-07-01: 1000 mg via INTRAVENOUS
  Filled 2020-07-01: qty 100

## 2020-07-01 NOTE — ED Provider Notes (Signed)
[facility] EMERGENCY DEPARTMENT Provider Note   CSN: [id number] Arrival date & time: 07/01/20  4332     History Chief Complaint  Patient presents with  . Seizures    Tim Bradley is a 69 y.o. male.  HPI 69 year old male presents with a presumed seizure.  History is obtained from the nurse who spoke to EMS as well as the patient.  Patient states he started to feel weird and then roommates heard a loud noise and a fall.  He was in the bathtub.  He denies any headache or obvious head injury.  He was postictal on EMS arrival.  He indicates he has not had his antiepileptics for over 1 month.  He denies any acute illness or pain. Last used cocaine a few days ago. States he hasn't used alcohol in a week or more.   Past Medical History:  Diagnosis Date  . Abscess 02/2017   LEFT GLUTEAL   . Acid reflux   . Alcohol abuse   . Anemia   . CHF (congestive heart failure) ([place])   . 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 Cocaine abuse ([place])   . DDD (degenerative disc disease), lumbar   . Diabetes mellitus without complication ([place])   . Dyspnea   . Enlarged prostate   . Hypertension   . Seizures ([place])   . Spinal stenosis, lumbar     Patient Active Problem List   Diagnosis Date Noted  . Pressure injury of skin 05/03/2020  . Acute encephalopathy 05/02/2020  . Diarrhea 05/02/2020  . QT prolongation 05/02/2020  . Acute combined systolic and diastolic congestive heart failure ([place]) 04/22/2020  . Essential hypertension 04/11/2020  . Leg edema 04/11/2020  . Dyspnea 04/11/2020  . Acute on chronic congestive heart failure ([place]) 04/11/2020  . BPH (benign prostatic hyperplasia) 04/11/2020  . 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 Generalized weakness 04/11/2020  . Seizure ([place]) 07/21/2019  . Cocaine abuse ([place])   . Constipation 08/22/2018  . Gastritis 08/20/2018  . Heme positive stool   . Acute  blood loss anemia secondary to massive gastric ulcer   . Hyponatremia   . Abscess, gluteal, left 03/26/2017  . Type 2 diabetes mellitus ([place]) 03/26/2017  . Adrenal nodule ([place]) 03/26/2017  . Closed fracture of body of thoracic vertebra ([place]) 03/26/2017  . Spinal stenosis of lumbar region 03/26/2017    Past Surgical History:  Procedure Laterality Date  . BACK SURGERY     cervical x2  . BIOPSY  08/21/2018   Procedure: BIOPSY;  Surgeon: Ronald Lobo, MD;  Location: [facility];  Service: Endoscopy;;  . ESOPHAGOGASTRODUODENOSCOPY (EGD) WITH PROPOFOL N/A 08/21/2018   Procedure: ESOPHAGOGASTRODUODENOSCOPY (EGD) WITH PROPOFOL;  Surgeon: Ronald Lobo, MD;  Location: [facility];  Service: Endoscopy;  Laterality: N/A;  . INCISION AND DRAINAGE PERIRECTAL ABSCESS N/A 03/26/2017   Procedure: IRRIGATION AND DEBRIDEMENT PERIRECTAL ABSCESS;  Surgeon: Rolm Bookbinder, MD;  Location: [facility];  Service: General;  Laterality: N/A;       Family History  Problem Relation Age of Onset  . Hypertension Mother   . Cancer - Lung Father     Social History   Tobacco Use  . Smoking status: Current Every Day Smoker    Packs/day: 0.25    Types: Cigarettes  . Smokeless tobacco: Never Used  Vaping Use  . Vaping Use: Never used  Substance Use Topics  . Alcohol use: Yes  Alcohol/week: 12.0 standard drinks    Types: 12 Cans of beer per week    Comment: i can of beer daily  . Drug use: Yes    Types: Cocaine, Marijuana    Comment: 03/2020     Home Medications Prior to Admission medications   Medication Sig Start Date End Date Taking? Authorizing Provider  acetaminophen (TYLENOL) 325 MG tablet Take 2 tablets (650 mg total) by mouth every 4 (four) hours as needed for headache or mild pain. 05/09/20   Terrilee Croak, MD  aspirin EC 81 MG EC tablet Take 1 tablet (81 mg total) by mouth daily. Swallow whole. 04/14/20   Raiford Noble Latif, DO  carvedilol (COREG) 3.125 MG tablet Take 1 tablet (3.125 mg  total) by mouth 2 (two) times daily with a meal. 04/13/20   Sheikh, Omair Latif, DO  divalproex (DEPAKOTE) 500 MG DR tablet Take 1 tablet (500 mg total) by mouth every 12 (twelve) hours. 07/01/20 08/30/20  Sherwood Gambler, MD  folic acid (FOLVITE) 1 MG tablet Take 1 tablet (1 mg total) by mouth daily. 05/10/20   Terrilee Croak, MD  furosemide (LASIX) 40 MG tablet Take 1 tablet (40 mg total) by mouth daily as needed for fluid or edema. 05/09/20   Dahal, Marlowe Aschoff, MD  isosorbide-hydrALAZINE (BIDIL) 20-37.5 MG tablet Take 1 tablet by mouth 3 (three) times daily. 04/13/20   Raiford Noble Latif, DO  levETIRAcetam (KEPPRA) 500 MG tablet Take 1 tablet (500 mg total) by mouth 2 (two) times daily. 07/01/20   Sherwood Gambler, MD  Multiple Vitamin (MULTIVITAMIN WITH MINERALS) TABS tablet Take 1 tablet by mouth daily. 05/10/20   Terrilee Croak, MD  tamsulosin (FLOMAX) 0.4 MG CAPS capsule Take 1 capsule (0.4 mg total) by mouth daily after supper. 01/27/20   Deno Etienne, DO  thiamine 100 MG tablet Take 1 tablet (100 mg total) by mouth daily. 05/10/20   Terrilee Croak, MD    Allergies    Patient has no known allergies.  Review of Systems   Review of Systems  Constitutional: Negative for fever.  Musculoskeletal: Negative for neck pain.  Neurological: Positive for seizures. Negative for weakness and headaches.  All other systems reviewed and are negative.   Physical Exam Updated Vital Signs BP (!) 150/92 (BP Location: Left Arm)   Pulse 75   Temp 97.8 F (36.6 C) (Oral)   Resp 16   SpO2 96%   Physical Exam Vitals and nursing note reviewed.  Constitutional:      General: He is not in acute distress.    Appearance: He is well-developed. He is not ill-appearing or diaphoretic.     Interventions: Cervical collar in place.  HENT:     Head: Normocephalic and atraumatic.     Right Ear: External ear normal.     Left Ear: External ear normal.     Nose: Nose normal.     Mouth/Throat:     Comments: No tongue  injury Eyes:     General:        Right eye: No discharge.        Left eye: No discharge.     Extraocular Movements: Extraocular movements intact.     Pupils: Pupils are equal, round, and reactive to light.  Cardiovascular:     Rate and Rhythm: Normal rate and regular rhythm.     Heart sounds: Normal heart sounds.  Pulmonary:     Effort: Pulmonary effort is normal.     Breath sounds: Normal breath sounds.  Abdominal:     Palpations: Abdomen is soft.     Tenderness: There is no abdominal tenderness.  Musculoskeletal:     Cervical back: Neck supple.  Skin:    General: Skin is warm and dry.  Neurological:     Mental Status: He is alert.     Comments: Alert, oriented to person, place, situation, disoriented to time (thinks it's august/september 2021). CN 3-12 grossly intact. 5/5 strength in all 4 extremities. Grossly normal sensation. Normal finger to nose.   Psychiatric:        Mood and Affect: Mood is not anxious.     ED Results / Procedures / Treatments   Labs (all labs ordered are listed, but only abnormal results are displayed) Labs Reviewed  COMPREHENSIVE METABOLIC PANEL - Abnormal; Notable for the following components:      Result Value   Sodium 134 (*)    Glucose, Bld 114 (*)    BUN 30 (*)    Creatinine, Ser 2.28 (*)    Total Protein 6.1 (*)    Albumin 2.9 (*)    GFR calc non Af Amer 28 (*)    GFR calc Af Amer 32 (*)    All other components within normal limits  CBC WITH DIFFERENTIAL/PLATELET  MAGNESIUM  ETHANOL  VALPROIC ACID LEVEL  URINALYSIS, ROUTINE W REFLEX MICROSCOPIC  CBG MONITORING, ED    EKG EKG Interpretation  Date/Time:  Sunday July 01 2020 04:38:51 EDT Ventricular Rate:  77 PR Interval:    QRS Duration: 169 QT Interval:  478 QTC Calculation: 541 R Axis:   -44 Text Interpretation: Sinus rhythm LAE, consider biatrial enlargement Left bundle branch block no significant change since Aug 2021 Confirmed by Sherwood Gambler [id number]) on 07/01/2020  4:51:38 AM   Radiology CT HEAD WO CONTRAST  Result Date: 07/01/2020 CLINICAL DATA:  Head trauma.  Fall.  History of seizures. EXAM: CT HEAD WITHOUT CONTRAST CT CERVICAL SPINE WITHOUT CONTRAST TECHNIQUE: Multidetector CT imaging of the head and cervical spine was performed following the standard protocol without intravenous contrast. Multiplanar CT image reconstructions of the cervical spine were also generated. COMPARISON:  Head CT 05/02/2020, head MRI 05/03/2020, cervical spine CT 05/06/2020, and cervical spine MRI 05/04/2020 FINDINGS: CT HEAD FINDINGS Brain: There is no evidence of an acute infarct, intracranial hemorrhage, mass, midline shift, or extra-axial fluid collection. The ventricles and sulci are within normal limits for age. Patchy hypodensities in the cerebral white matter bilaterally are unchanged and nonspecific but compatible with moderate chronic small vessel ischemic disease. Vascular: Calcified atherosclerosis at the skull base. Unchanged hyperdensity along the left ACA in the distal A2/proximal A3 region which may reflect atherosclerosis or chronic thrombus. Skull: No fracture or suspicious osseous lesion. Sinuses/Orbits: Paranasal sinuses and mastoid air cells are clear. Unremarkable orbits. Other: None. CT CERVICAL SPINE FINDINGS Alignment: Unchanged alignment including grade 1 anterolisthesis of C7 on T1 which appears degenerative and facet mediated. Skull base and vertebrae: No acute fracture or suspicious osseous lesion. C3-4 ACDF with solid fusion and anterior plate and screw remaining in place. Solid interbody osseous fusion at C4-5 and C6-7. Bilateral facet ankylosis from C3-C5 and at C6-7. Soft tissues and spinal canal: No prevertebral fluid or swelling. No visible canal hematoma. Disc levels: Similar appearance of degenerative changes compared to the prior CT and MRI including advanced disc and facet degeneration at C5-6 with severe spinal and neural foraminal stenosis. Upper  chest: No apical lung consolidation or mass. 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 Mild-to-moderate calcific atherosclerosis at the carotid bifurcations. IMPRESSION: 1. No evidence of acute intracranial abnormality. 2. Moderate chronic small vessel ischemic disease. 3. No evidence of acute fracture or traumatic subluxation in the cervical spine. 4. Advanced cervical disc and facet degeneration with severe spinal and neural foraminal stenosis at C5-6. Electronically Signed   By: Logan Bores M.D.   On: 07/01/2020 05:34   CT CERVICAL SPINE WO CONTRAST  Result Date: 07/01/2020 CLINICAL DATA:  Head trauma.  Fall.  History of seizures. EXAM: CT HEAD WITHOUT CONTRAST CT CERVICAL SPINE WITHOUT CONTRAST TECHNIQUE: Multidetector CT imaging of the head and cervical spine was performed following the standard protocol without intravenous contrast. Multiplanar CT image reconstructions of the cervical spine were also generated. COMPARISON:  Head CT 05/02/2020, head MRI 05/03/2020, cervical spine CT 05/06/2020, and cervical spine MRI 05/04/2020 FINDINGS: CT HEAD FINDINGS Brain: There is no evidence of an acute infarct, intracranial hemorrhage, mass, midline shift, or extra-axial fluid collection. The ventricles and sulci are within normal limits for age. Patchy hypodensities in the cerebral white matter bilaterally are unchanged and nonspecific but compatible with moderate chronic small vessel ischemic disease. Vascular: Calcified atherosclerosis at the skull base. Unchanged hyperdensity along the left ACA in the distal A2/proximal A3 region which may reflect atherosclerosis or chronic thrombus. Skull: No fracture or suspicious osseous lesion. Sinuses/Orbits: Paranasal sinuses and mastoid air cells are clear. Unremarkable orbits. Other: None. CT CERVICAL SPINE FINDINGS Alignment: Unchanged alignment including grade 1 anterolisthesis of C7 on T1 which appears  degenerative and facet mediated. Skull base and vertebrae: No acute fracture or suspicious osseous lesion. C3-4 ACDF with solid fusion and anterior plate and screw remaining in place. Solid interbody osseous fusion at C4-5 and C6-7. Bilateral facet ankylosis from C3-C5 and at C6-7. Soft tissues and spinal canal: No prevertebral fluid or swelling. No visible canal hematoma. Disc levels: Similar appearance of degenerative changes compared to the prior CT and MRI including advanced disc and facet degeneration at C5-6 with severe spinal and neural foraminal stenosis. Upper chest: No apical lung consolidation or mass. Other: 4 cm lipoma in the right neck located between the right thyroid lobe and internal jugular vein. Mild-to-moderate calcific atherosclerosis at the carotid bifurcations. IMPRESSION: 1. No evidence of acute intracranial abnormality. 2. Moderate chronic small vessel ischemic disease. 3. No evidence of acute fracture or traumatic subluxation in the cervical spine. 4. Advanced cervical disc and facet degeneration with severe spinal and neural foraminal stenosis at C5-6. Electronically Signed   By: Logan Bores M.D.   On: 07/01/2020 05:34    Procedures Procedures (including critical care time)  Medications Ordered in ED Medications  levETIRAcetam (KEPPRA) IVPB 1000 mg/100 mL premix (0 mg Intravenous Stopped 07/01/20 0610)    ED Course  I have reviewed the triage vital signs and the nursing notes.  Pertinent labs & imaging results that were available during my care of the patient were reviewed by me and considered in my medical decision making (see chart for details).    MDM Rules/Calculators/A&P                          Patient's seizure tonight appears to be from noncompliance.  He was given IV Keppra.  Mild confusion on arrival that was probably related to the acute seizure as this has cleared.  His vitals are unremarkable besides some mild hypertension.  No obvious signs  of infection at  this time.  Labs show normal WBC and no significant electrolyte abnormalities.  Patient feels well and is ready to go.  His confusion initially has resolved and he is now alert and oriented to person, place, time and situation.  Stressed the importance of taking his meds as instructed.  Discharge with Rx for keppra, depakoate Final Clinical Impression(s) / ED Diagnoses Final diagnoses:  Seizure ([place])    Rx / DC Orders ED Discharge Orders         Ordered    divalproex (DEPAKOTE) 500 MG DR tablet  Every 12 hours        07/01/20 0710    levETIRAcetam (KEPPRA) 500 MG tablet  2 times daily        07/01/20 0710           Sherwood Gambler, MD 07/01/20 [phone]

## 2020-07-01 NOTE — Discharge Instructions (Signed)
-   According to [place] law, you can not drive unless you are seizure / syncope free for at least 6 months and under physician's care.  °  °- Please maintain precautions. Do not participate in activities where a loss of awareness could harm you or someone else. No swimming alone, no tub bathing, no hot tubs, no driving, no operating motorized vehicles (cars, ATVs, motocycles, etc), lawnmowers, power tools or firearms. No standing at heights, such as rooftops, ladders or stairs. Avoid hot objects such as stoves, heaters, open fires. Wear a helmet when riding a bicycle, scooter, skateboard, etc. and avoid areas of traffic. Set your water heater to 120 degrees or less.  °

## 2020-07-01 NOTE — ED Notes (Signed)
Patient transported to CT 

## 2020-07-01 NOTE — ED Notes (Signed)
CBG 109

## 2020-07-01 NOTE — ED Notes (Signed)
ED Provider at bedside. 

## 2020-07-01 NOTE — ED Notes (Signed)
Patient updated on plan of care, verbalized understanding of same. Patient remains AAOx4, now having full conversation with nurse with no difficulty. No seizure activity noted. Side rails remain padded. NAD noted. Patient denies further needs.

## 2020-07-01 NOTE — ED Notes (Signed)
Condom cath applied to pt for urine specimen collection

## 2020-07-01 NOTE — ED Triage Notes (Signed)
Patient presents from home, roommates heard a thud in bathroom and found patient on the floor. Hx of seizures, not taking Keppra. EMS reports pt was post ictal on arrival, currently AAOx3. Follows commands, moves all extremities. No unilateral weakness noted. Patient denies pain. C-collar in place.

## 2020-07-02 LAB — CBG MONITORING, ED: Glucose-Capillary: 109 mg/dL — ABNORMAL HIGH (ref 70–99)

## 2020-07-17 ENCOUNTER — Emergency Department (HOSPITAL_COMMUNITY): Payer: No Typology Code available for payment source

## 2020-07-17 ENCOUNTER — Emergency Department (HOSPITAL_COMMUNITY)
Admission: EM | Admit: 2020-07-17 | Discharge: 2020-07-17 | Disposition: A | Discharge status: Home / Self Care | Payer: No Typology Code available for payment source | Attending: Emergency Medicine | Admitting: Emergency Medicine

## 2020-07-17 ENCOUNTER — Other Ambulatory Visit: Payer: Self-pay

## 2020-07-17 DIAGNOSIS — F1410 Cocaine abuse, uncomplicated: Secondary | ICD-10-CM

## 2020-07-17 DIAGNOSIS — Z20822 Contact with and (suspected) exposure to covid-19: Secondary | ICD-10-CM | POA: Insufficient documentation

## 2020-07-17 DIAGNOSIS — N184 Chronic kidney disease, stage 4 (severe): Secondary | ICD-10-CM | POA: Diagnosis not present

## 2020-07-17 DIAGNOSIS — I429 Cardiomyopathy, unspecified: Secondary | ICD-10-CM | POA: Diagnosis not present

## 2020-07-17 DIAGNOSIS — I130 Hypertensive heart and chronic kidney disease with heart failure and stage 1 through stage 4 chronic kidney disease, or unspecified chronic kidney disease: Secondary | ICD-10-CM | POA: Diagnosis not present

## 2020-07-17 DIAGNOSIS — F17210 Nicotine dependence, cigarettes, uncomplicated: Secondary | ICD-10-CM | POA: Insufficient documentation

## 2020-07-17 DIAGNOSIS — Z79899 Other long term (current) drug therapy: Secondary | ICD-10-CM | POA: Diagnosis not present

## 2020-07-17 DIAGNOSIS — I447 Left bundle-branch block, unspecified: Secondary | ICD-10-CM

## 2020-07-17 DIAGNOSIS — Z9119 Patient's noncompliance with other medical treatment and regimen: Secondary | ICD-10-CM

## 2020-07-17 DIAGNOSIS — Z7982 Long term (current) use of aspirin: Secondary | ICD-10-CM | POA: Insufficient documentation

## 2020-07-17 DIAGNOSIS — I5041 Acute combined systolic (congestive) and diastolic (congestive) heart failure: Secondary | ICD-10-CM | POA: Insufficient documentation

## 2020-07-17 DIAGNOSIS — E1122 Type 2 diabetes mellitus with diabetic chronic kidney disease: Secondary | ICD-10-CM | POA: Insufficient documentation

## 2020-07-17 DIAGNOSIS — R569 Unspecified convulsions: Secondary | ICD-10-CM

## 2020-07-17 DIAGNOSIS — Z91199 Patient's noncompliance with other medical treatment and regimen due to unspecified reason: Secondary | ICD-10-CM

## 2020-07-17 LAB — ETHANOL: Alcohol, Ethyl (B): <10 mg/dL (ref <10)

## 2020-07-17 LAB — COMPREHENSIVE METABOLIC PANEL
ALT: 14 U/L (ref 0–44)
AST: 17 U/L (ref 15–41)
Albumin: 3.2 g/dL — ABNORMAL LOW (ref 3.5–5.0)
Alkaline Phosphatase: 63 U/L (ref 38–126)
Anion gap: 12 (ref 5–15)
BUN: 46 mg/dL — ABNORMAL HIGH (ref 8–23)
CO2: 22 mmol/L (ref 22–32)
Calcium: 9.1 mg/dL (ref 8.9–10.3)
Chloride: 102 mmol/L (ref 98–111)
Creatinine, Ser: 2.1 mg/dL — ABNORMAL HIGH (ref 0.61–1.24)
GFR, Estimated: 31 mL/min — ABNORMAL LOW (ref >60)
Glucose, Bld: 125 mg/dL — ABNORMAL HIGH (ref 70–99)
Potassium: 4.2 mmol/L (ref 3.5–5.1)
Sodium: 136 mmol/L (ref 135–145)
Total Bilirubin: 0.7 mg/dL (ref 0.3–1.2)
Total Protein: 6.8 g/dL (ref 6.5–8.1)

## 2020-07-17 LAB — CBC WITH DIFFERENTIAL/PLATELET
Abs Immature Granulocytes: 0.03 10*3/uL (ref 0.00–0.07)
Basophils Absolute: 0.1 10*3/uL (ref 0.0–0.1)
Basophils Relative: 1 %
Eosinophils Absolute: 0.2 10*3/uL (ref 0.0–0.5)
Eosinophils Relative: 3 %
HCT: 46.1 % (ref 39.0–52.0)
Hemoglobin: 14.3 g/dL (ref 13.0–17.0)
Immature Granulocytes: 1 %
Lymphocytes Relative: 26 %
Lymphs Abs: 1.5 10*3/uL (ref 0.7–4.0)
MCH: 28 pg (ref 26.0–34.0)
MCHC: 31 g/dL (ref 30.0–36.0)
MCV: 90.4 fL (ref 80.0–100.0)
Monocytes Absolute: 0.6 10*3/uL (ref 0.1–1.0)
Monocytes Relative: 10 %
Neutro Abs: 3.5 10*3/uL (ref 1.7–7.7)
Neutrophils Relative %: 59 %
Platelets: 326 10*3/uL (ref 150–400)
RBC: 5.1 MIL/uL (ref 4.22–5.81)
RDW: 13.3 % (ref 11.5–15.5)
WBC: 5.9 10*3/uL (ref 4.0–10.5)
nRBC: 0 % (ref 0.0–0.2)

## 2020-07-17 LAB — RAPID URINE DRUG SCREEN, HOSP PERFORMED
Amphetamines: NOT DETECTED
Barbiturates: NOT DETECTED
Benzodiazepines: NOT DETECTED
Cocaine: POSITIVE — AB
Opiates: NOT DETECTED
Tetrahydrocannabinol: NOT DETECTED

## 2020-07-17 LAB — VALPROIC ACID LEVEL: Valproic Acid Lvl: <10 ug/mL — ABNORMAL LOW (ref 50.0–100.0)

## 2020-07-17 LAB — RESPIRATORY PANEL BY RT PCR (FLU A&B, COVID)
Influenza A by PCR: NEGATIVE
Influenza B by PCR: NEGATIVE
SARS Coronavirus 2 by RT PCR: NEGATIVE

## 2020-07-17 LAB — TROPONIN I (HIGH SENSITIVITY)
Troponin I (High Sensitivity): 15 ng/L (ref <18)
Troponin I (High Sensitivity): 17 ng/L (ref <18)

## 2020-07-17 LAB — BRAIN NATRIURETIC PEPTIDE: B Natriuretic Peptide: 686 pg/mL — ABNORMAL HIGH (ref 0.0–100.0)

## 2020-07-17 MED ORDER — LEVETIRACETAM 500 MG PO TABS: 500.0000 mg | ORAL_TABLET | Freq: Two times a day (BID) | ORAL | 0 refills | Status: DC

## 2020-07-17 MED ORDER — LEVETIRACETAM IN NACL 1000 MG/100ML IV SOLN
1000.0000 mg | Freq: Once | INTRAVENOUS | Status: AC
  Administered 2020-07-17: 1000 mg via INTRAVENOUS
  Filled 2020-07-17: qty 100

## 2020-07-17 MED ORDER — ISOSORB DINITRATE-HYDRALAZINE 20-37.5 MG PO TABS: 1.0000 | ORAL_TABLET | Freq: Three times a day (TID) | ORAL | 0 refills | Status: DC

## 2020-07-17 MED ORDER — ASPIRIN 81 MG PO TBEC
81.0000 mg | DELAYED_RELEASE_TABLET | Freq: Every day | ORAL | 0 refills | Status: DC
Start: 2020-07-17 — End: 2021-12-24

## 2020-07-17 MED ORDER — DIVALPROEX SODIUM 500 MG PO DR TAB: 500.0000 mg | DELAYED_RELEASE_TABLET | Freq: Two times a day (BID) | ORAL | 1 refills | Status: DC

## 2020-07-17 NOTE — Social Work (Signed)
CSW met with Pt at bedside. Pt reports that he receives services through the [facility], including receiving medications via mail. CSW engaged in SUD-cocaine education. Pt states that he does not wish to stop using cocaine as it enhances his sexual performance. CSW discussed other methods/prescription drugs that are safer. CSW urged Pt to discuss matter with his PCP at next appointment.  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 [place] encouraged Pt to seek counseling through the [facility] to further discuss these as they serve as triggers for cocaine use. CSW offered Pt Narcotics Anonymous meeting list, Pt accepted, "in case I ever decide to go." Pt expressed thanks.

## 2020-07-17 NOTE — Discharge Planning (Signed)
RNCM consulted in regards to medication assistance.  Pt has insurance coverage and is not eligible for Medication Assistance Through [place] (MATCH) program. 

## 2020-07-17 NOTE — ED Provider Notes (Signed)
[facility] EMERGENCY DEPARTMENT Provider Note   CSN: [id number] Arrival date & time: 07/17/20  7408     History Chief Complaint  Patient presents with  . Seizures    Tim Bradley is a 69 y.o. male.  HPI Patient brought in after possible seizure.  Reportedly roommate called in because patient may have had a seizure.  Unknown for sure if patient had visualized seizure activity.  Reportedly was confused after.  Patient states he was confused and because of that felt he could have had a seizure.  Has had no chest pain.  However has been noncompliant with all his medication since discharge from hospital.  States he was unable to get them filled.  Has not had chest pain.  However has anterior ST elevation on prehospital EKGs.  I discussed with Dr. Martinique from cardiology prior to patient's arrival and STEMI not called due to no chest pain.  Has a known ejection fraction less than 20%.    Past Medical History:  Diagnosis Date  . Abscess 02/2017   LEFT GLUTEAL   . Acid reflux   . Alcohol abuse   . Anemia   . CHF (congestive heart failure) ([place])   . 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 Cocaine abuse ([place])   . DDD (degenerative disc disease), lumbar   . Diabetes mellitus without complication ([place])   . Dyspnea   . Enlarged prostate   . Hypertension   . Seizures ([place])   . Spinal stenosis, lumbar     Patient Active Problem List   Diagnosis Date Noted  . Pressure injury of skin 05/03/2020  . Acute encephalopathy 05/02/2020  . Diarrhea 05/02/2020  . QT prolongation 05/02/2020  . Acute combined systolic and diastolic congestive heart failure ([place]) 04/22/2020  . Essential hypertension 04/11/2020  . Leg edema 04/11/2020  . Dyspnea 04/11/2020  . Acute on chronic congestive heart failure ([place]) 04/11/2020  . BPH (benign prostatic hyperplasia) 04/11/2020  . 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  Generalized weakness 04/11/2020  . Seizure ([place]) 07/21/2019  . Cocaine abuse ([place])   . Constipation 08/22/2018  . Gastritis 08/20/2018  . Heme positive stool   . Acute blood loss anemia secondary to massive gastric ulcer   . Hyponatremia   . Abscess, gluteal, left 03/26/2017  . Type 2 diabetes mellitus ([place]) 03/26/2017  . Adrenal nodule ([place]) 03/26/2017  . Closed fracture of body of thoracic vertebra ([place]) 03/26/2017  . Spinal stenosis of lumbar region 03/26/2017    Past Surgical History:  Procedure Laterality Date  . BACK SURGERY     cervical x2  . BIOPSY  08/21/2018   Procedure: BIOPSY;  Surgeon: Ronald Lobo, MD;  Location: [facility];  Service: Endoscopy;;  . ESOPHAGOGASTRODUODENOSCOPY (EGD) WITH PROPOFOL N/A 08/21/2018   Procedure: ESOPHAGOGASTRODUODENOSCOPY (EGD) WITH PROPOFOL;  Surgeon: Ronald Lobo, MD;  Location: [facility];  Service: Endoscopy;  Laterality: N/A;  . INCISION AND DRAINAGE PERIRECTAL ABSCESS N/A 03/26/2017   Procedure: IRRIGATION AND DEBRIDEMENT PERIRECTAL ABSCESS;  Surgeon: Rolm Bookbinder, MD;  Location: [facility];  Service: General;  Laterality: N/A;       Family History  Problem Relation Age of Onset  . Hypertension Mother   . Cancer - Lung Father     Social History   Tobacco Use  . Smoking status: Current Every Day Smoker    Packs/day: 0.25    Types: Cigarettes  .  Smokeless tobacco: Never Used  Vaping Use  . Vaping Use: Never used  Substance Use Topics  . Alcohol use: Yes    Alcohol/week: 12.0 standard drinks    Types: 12 Cans of beer per week    Comment: i can of beer daily  . Drug use: Yes    Types: Cocaine, Marijuana    Comment: 03/2020     Home Medications Prior to Admission medications   Medication Sig Start Date End Date Taking? Authorizing Provider  acetaminophen (TYLENOL) 325 MG tablet Take 2 tablets (650 mg total) by mouth every 4 (four) hours as needed for headache or mild pain. 05/09/20  Yes Dahal, Marlowe Aschoff, MD   carvedilol (COREG) 3.125 MG tablet Take 1 tablet (3.125 mg total) by mouth 2 (two) times daily with a meal. 04/13/20  Yes Sheikh, Omair Latif, DO  tamsulosin (FLOMAX) 0.4 MG CAPS capsule Take 1 capsule (0.4 mg total) by mouth daily after supper. 01/27/20  Yes Deno Etienne, DO  aspirin EC 81 MG EC tablet Take 1 tablet (81 mg total) by mouth daily. Swallow whole. Patient not taking: Reported on 07/17/2020 04/14/20   Raiford Noble Latif, DO  divalproex (DEPAKOTE) 500 MG DR tablet Take 1 tablet (500 mg total) by mouth every 12 (twelve) hours. Patient not taking: Reported on 07/17/2020 07/01/20 08/30/20  Sherwood Gambler, MD  folic acid (FOLVITE) 1 MG tablet Take 1 tablet (1 mg total) by mouth daily. Patient not taking: Reported on 07/17/2020 05/10/20   Terrilee Croak, MD  furosemide (LASIX) 40 MG tablet Take 1 tablet (40 mg total) by mouth daily as needed for fluid or edema. Patient not taking: Reported on 07/17/2020 05/09/20   Terrilee Croak, MD  isosorbide-hydrALAZINE (BIDIL) 20-37.5 MG tablet Take 1 tablet by mouth 3 (three) times daily. Patient not taking: Reported on 07/17/2020 04/13/20   Raiford Noble Latif, DO  levETIRAcetam (KEPPRA) 500 MG tablet Take 1 tablet (500 mg total) by mouth 2 (two) times daily. 07/01/20   Sherwood Gambler, MD  Multiple Vitamin (MULTIVITAMIN WITH MINERALS) TABS tablet Take 1 tablet by mouth daily. Patient not taking: Reported on 07/17/2020 05/10/20   Terrilee Croak, MD  thiamine 100 MG tablet Take 1 tablet (100 mg total) by mouth daily. Patient not taking: Reported on 07/17/2020 05/10/20   Terrilee Croak, MD    Allergies    Patient has no known allergies.  Review of Systems   Review of Systems  Constitutional: Negative for appetite change.  HENT: Negative for congestion.   Respiratory: Negative for shortness of breath.   Cardiovascular: Negative for chest pain.  Gastrointestinal: Negative for abdominal pain.  Genitourinary: Negative for flank pain.  Musculoskeletal:  Negative for arthralgias and back pain.  Skin: Negative for rash.  Neurological: Positive for seizures and syncope. Negative for tremors.  Psychiatric/Behavioral: Negative for confusion.    Physical Exam Updated Vital Signs BP (!) 104/54   Pulse 80   Temp 98.5 F (36.9 C) (Other (Comment))   Resp 19   SpO2 94%   Physical Exam Vitals and nursing note reviewed.  HENT:     Head: Normocephalic.  Eyes:     Extraocular Movements: Extraocular movements intact.  Cardiovascular:     Rate and Rhythm: Regular rhythm.  Pulmonary:     Breath sounds: No wheezing or rhonchi.  Abdominal:     Tenderness: There is no abdominal tenderness.  Musculoskeletal:        General: No tenderness.     Cervical back: Neck supple.  Skin:    General: Skin is warm.     Capillary Refill: Capillary refill takes less than 2 seconds.  Neurological:     Mental Status: He is alert and oriented to person, place, and time.     ED Results / Procedures / Treatments   Labs (all labs ordered are listed, but only abnormal results are displayed) Labs Reviewed  COMPREHENSIVE METABOLIC PANEL - Abnormal; Notable for the following components:      Result Value   Glucose, Bld 125 (*)    BUN 46 (*)    Creatinine, Ser 2.10 (*)    Albumin 3.2 (*)    GFR, Estimated 31 (*)    All other components within normal limits  RAPID URINE DRUG SCREEN, HOSP PERFORMED - Abnormal; Notable for the following components:   Cocaine POSITIVE (*)    All other components within normal limits  BRAIN NATRIURETIC PEPTIDE - Abnormal; Notable for the following components:   B Natriuretic Peptide 686.0 (*)    All other components within normal limits  VALPROIC ACID LEVEL - Abnormal; Notable for the following components:   Valproic Acid Lvl <10 (*)    All other components within normal limits  RESPIRATORY PANEL BY RT PCR (FLU A&B, COVID)  CBC WITH DIFFERENTIAL/PLATELET  ETHANOL  TROPONIN I (HIGH SENSITIVITY)  TROPONIN I (HIGH  SENSITIVITY)    EKG EKG Interpretation  Date/Time:  Tuesday July 17 2020 08:06:39 EDT Ventricular Rate:  75 PR Interval:    QRS Duration: 161 QT Interval:  463 QTC Calculation: 518 R Axis:   -63 Text Interpretation: Sinus rhythm Atrial premature complex Left atrial enlargement Left bundle branch block Confirmed by Davonna Belling [phone]) on 07/17/2020 8:08:55 AM           Radiology DG Chest Portable 1 View  Result Date: 07/17/2020 CLINICAL DATA:  Syncope EXAM: PORTABLE CHEST 1 VIEW COMPARISON:  05/29/2020 FINDINGS: The heart size and mediastinal contours are within normal limits. Both lungs are clear. The visualized skeletal structures are unremarkable. IMPRESSION: No active disease. Electronically Signed   By: Fidela Salisbury MD   On: 07/17/2020 08:35    Procedures Procedures (including critical care time)  Medications Ordered in ED Medications  levETIRAcetam (KEPPRA) IVPB 1000 mg/100 mL premix (0 mg Intravenous Stopped 07/17/20 1028)    ED Course  I have reviewed the triage vital signs and the nursing notes.  Pertinent labs & imaging results that were available during my care of the patient were reviewed by me and considered in my medical decision making (see chart for details).    MDM Rules/Calculators/A&P                          Patient with seizure.  Initial EKG changes have improved.  Negative troponin x2.  However has used cocaine recently.  Seizure wise I think is likely due to noncompliance.  Not been taking his medications.  Loaded on IV medicines here.  Has been seen by cardiology for the EKG changes.  Patient does not appear to need acute intervention.  Not a candidate for AICD due to noncompliance and cocaine abuse even if this was arrhythmia.  Not a good candidate for heart cath due to renal insufficiency.  Will discharge home.  However will discuss with transitions of care about being able to sources medications.  I have independently reviewed the  EKG lab work and imaging on this patient. Final Clinical Impression(s) / ED Diagnoses Final  diagnoses:  Seizure [facility])  Noncompliance    Rx / DC Orders ED Discharge Orders    None       Davonna Belling, MD 07/17/20 1350

## 2020-07-17 NOTE — Consult Note (Addendum)
Cardiology Consultation:   Patient ID: Tim Bradley MRN: [id number]; DOB: [birth year]  Admit date: 07/17/2020 Date of Consult: 07/17/2020  Primary Care Provider: [place] Cardiologist: Jenkins Rouge, MD  [place] Electrophysiologist:  None    Patient Profile:   Tim Bradley is a 69 y.o. male with a hx of GERD, alcohol abuse, drug use (cocaine), tobacco use, CKD stage 4, DM2, Dyspnea, HTN, systolic CHF, and seizures who is being seen today for the evaluation of abnormal EKG at the request of Dr. Alvino Chapel.  History of Present Illness:   Tim Bradley was first seen by [facility] for CHF during an admission. UDS + cocaine at that time. Also reported tobacco/alcohol use. BNP up to 1366. He was admitted for IV diuresis and started on coreg and entresto. Baseline creatinine around 2. Echo showed EF<20%, G1DD, mildly reduced RV function, trivial MI, aortic dilation noted 26mm.  Plan was for CHF directed management with follows up echo in 3 months. 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 He left hospital AMA. He was readmitted 7/25 and underwent further diuresis.   He was seen again in August 2021 for CHF for an admission for seizure like activity. UDS+ cocaine. CT head was unremarkable. He was diuresed. He underwent stress testing for pre-op eval for spinal stenosos and procedure. Myoview showed perfusion defects in entire anterior and inferior wall that were slightly worse at stress. Likely infarct with small amount of wrisening ischemia with stress, but CM out of proportion to ischemia finding. Renal failure limited medication options. He was discharged on coreg and Bidil. Pt was not seen back in follow-up.   The patient presented to the ED 07/17/20 for seizures His roommate called bc he reported the patient was having a seizure. Patient said he was feeling his normal self this morning. He remembers getting ready to shower and the next thing he woke up on  the floor and EMS was there. He denies chest pain. Says he was confused when he woke up. He last used cocaine 4-5 days ago. Still smoking 4-5 cigarettes daily. Denies recent alcohol use. Says he hasn't has his medications in a couple days because he hasn't been to the [place].   In the ED BP 145/118, pulse 81, afebrile, RR 19, 100% O2. Labs showed troponin 15>17, creatinine 2.10, Bun 46, albumin 3.2, WBC 5.9, Hgb 14.3. BNP 686.  respiratory panel negative. UDS positive for cocaine. EKG showed possible STEMI however no code was called given patient denied chest pain.CXR showed no active disease.    Past Medical History:  Diagnosis Date  . Abscess 02/2017   LEFT GLUTEAL   . Acid reflux   . Alcohol abuse   . Anemia   . CHF (congestive heart failure) ([place])   . 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 Cocaine abuse ([place])   . DDD (degenerative disc disease), lumbar   . Diabetes mellitus without complication ([place])   . Dyspnea   . Enlarged prostate   . Hypertension   . Seizures ([place])   . Spinal stenosis, lumbar     Past Surgical History:  Procedure Laterality Date  . BACK SURGERY     cervical x2  . BIOPSY  08/21/2018   Procedure: BIOPSY;  Surgeon: Ronald Lobo, MD;  Location: [place];  Service: Endoscopy;;  . ESOPHAGOGASTRODUODENOSCOPY (EGD) WITH PROPOFOL N/A 08/21/2018   Procedure: ESOPHAGOGASTRODUODENOSCOPY (EGD) WITH PROPOFOL;  Surgeon: Ronald Lobo, MD;  Location: [facility];  Service: Endoscopy;  Laterality: N/A;  . INCISION AND DRAINAGE PERIRECTAL ABSCESS N/A 03/26/2017   Procedure: IRRIGATION AND DEBRIDEMENT PERIRECTAL ABSCESS;  Surgeon: Rolm Bookbinder, MD;  Location: [place];  Service: General;  Laterality: N/A;     Home Medications:  Prior to Admission medications   Medication Sig Start Date End Date Taking? Authorizing Provider  acetaminophen (TYLENOL) 325 MG tablet Take 2 tablets (650 mg total) by mouth every 4 (four) hours as needed  for headache or mild pain. 05/09/20  Yes Dahal, Marlowe Aschoff, MD  carvedilol (COREG) 3.125 MG tablet Take 1 tablet (3.125 mg total) by mouth 2 (two) times daily with a meal. 04/13/20  Yes Sheikh, Omair Latif, DO  tamsulosin (FLOMAX) 0.4 MG CAPS capsule Take 1 capsule (0.4 mg total) by mouth daily after supper. 01/27/20  Yes Deno Etienne, DO  aspirin EC 81 MG EC tablet Take 1 tablet (81 mg total) by mouth daily. Swallow whole. Patient not taking: Reported on 07/17/2020 04/14/20   Raiford Noble Latif, DO  divalproex (DEPAKOTE) 500 MG DR tablet Take 1 tablet (500 mg total) by mouth every 12 (twelve) hours. Patient not taking: Reported on 07/17/2020 07/01/20 08/30/20  Sherwood Gambler, MD  folic acid (FOLVITE) 1 MG tablet Take 1 tablet (1 mg total) by mouth daily. Patient not taking: Reported on 07/17/2020 05/10/20   Terrilee Croak, MD  furosemide (LASIX) 40 MG tablet Take 1 tablet (40 mg total) by mouth daily as needed for fluid or edema. Patient not taking: Reported on 07/17/2020 05/09/20   Terrilee Croak, MD  isosorbide-hydrALAZINE (BIDIL) 20-37.5 MG tablet Take 1 tablet by mouth 3 (three) times daily. Patient not taking: Reported on 07/17/2020 04/13/20   Raiford Noble Latif, DO  levETIRAcetam (KEPPRA) 500 MG tablet Take 1 tablet (500 mg total) by mouth 2 (two) times daily. 07/01/20   Sherwood Gambler, MD  Multiple Vitamin (MULTIVITAMIN WITH MINERALS) TABS tablet Take 1 tablet by mouth daily. Patient not taking: Reported on 07/17/2020 05/10/20   Terrilee Croak, MD  thiamine 100 MG tablet Take 1 tablet (100 mg total) by mouth daily. Patient not taking: Reported on 07/17/2020 05/10/20   Terrilee Croak, MD    Inpatient Medications: Scheduled Meds:  Continuous Infusions:  PRN Meds:   Allergies:   No Known Allergies  Social History:   Social History   Socioeconomic History  . Marital status: Widowed    Spouse name: Not on file  . Number of children: 0  . Years of education: Not on file  . Highest education  level: High school graduate  Occupational History  . Occupation: retired  Tobacco Use  . Smoking status: Current Every Day Smoker    Packs/day: 0.25    Types: Cigarettes  . Smokeless tobacco: Never Used  Vaping Use  . Vaping Use: Never used  Substance and Sexual Activity  . Alcohol use: Yes    Alcohol/week: 12.0 standard drinks    Types: 12 Cans of beer per week    Comment: i can of beer daily  . Drug use: Yes    Types: Cocaine, Marijuana    Comment: 03/2020   . Sexual activity: Not on file  Other Topics Concern  . Not on file  Social History Narrative   [place] veteran   Social Determinants of Health   Financial Resource Strain:   . Difficulty of Paying Living Expenses: Not on file  Food Insecurity:   . Worried About Charity fundraiser in the Last Year:  Not on file  . Ran Out of Food in the Last Year: Not on file  Transportation Needs:   . Lack of Transportation (Medical): Not on file  . Lack of Transportation (Non-Medical): Not on file  Physical Activity:   . Days of Exercise per Week: Not on file  . Minutes of Exercise per Session: Not on file  Stress:   . Feeling of Stress : Not on file  Social Connections:   . Frequency of Communication with Friends and Family: Not on file  . Frequency of Social Gatherings with Friends and Family: Not on file  . Attends Religious Services: Not on file  . Active Member of Clubs or Organizations: Not on file  . Attends Archivist Meetings: Not on file  . Marital Status: Not on file  Intimate Partner Violence:   . Fear of Current or Ex-Partner: Not on file  . Emotionally Abused: Not on file  . Physically Abused: Not on file  . Sexually Abused: Not on file    Family History:   Family History  Problem Relation Age of Onset  . Hypertension Mother   . Cancer - Lung Father      ROS:  Please see the history of present illness.  All other ROS reviewed and negative.     Physical Exam/Data:   Vitals:   07/17/20  1145 07/17/20 1200 07/17/20 1215 07/17/20 1230  BP: 128/87 138/79 108/71 (!) 104/54  Pulse: 80 81 77 80  Resp: 10 17 12 19   Temp:      TempSrc:      SpO2: 96% 93% 98% 94%    Intake/Output Summary (Last 24 hours) at 07/17/2020 1306 Last data filed at 07/17/2020 1028 Gross per 24 hour  Intake 100 ml  Output --  Net 100 ml   Last 3 Weights 05/09/2020 05/08/2020 05/07/2020  Weight (lbs) 216 lb 11.4 oz 212 lb 4.9 oz 215 lb  Weight (kg) 98.3 kg 96.3 kg 97.523 kg     There is no height or weight on file to calculate BMI.  General:  Well nourished, well developed, in no acute distress HEENT: normal Lymph: no adenopathy Neck: no JVD Endocrine:  No thryomegaly Vascular: No carotid bruits; FA pulses 2+ bilaterally without bruits  Cardiac:  normal S1, S2; RRR; no murmur  Lungs:  clear to auscultation bilaterally, no wheezing, rhonchi or rales  Abd: soft, nontender, no hepatomegaly  Ext: no edema Musculoskeletal:  No deformities, BUE and BLE strength normal and equal Skin: warm and dry  Neuro:  CNs 2-12 intact, no focal abnormalities noted Psych:  Normal affect   EKG:  The EKG was personally reviewed and demonstrates:  NSR, 75bpm, chronic LBBB, LAD, LVH, repol abnormalities Telemetry:  Telemetry was personally reviewed and demonstrates:  NSR, HR 70, LBBB  Relevant CV Studies:  Echo 04/11/20 1. Left ventricular ejection fraction, by estimation, is <20%. The left  ventricle has severely decreased function. The left ventricle demonstrates  global hypokinesis. There is moderate concentric left ventricular  hypertrophy. Left ventricular diastolic  parameters are consistent with Grade I diastolic dysfunction (impaired  relaxation).  2. Right ventricular systolic function is mildly reduced. The right  ventricular size is normal. There is normal pulmonary artery systolic  pressure.  3. Left atrial size was mildly dilated.  4. The mitral valve is normal in structure. Trivial mitral valve   regurgitation. No evidence of mitral stenosis.  5. The aortic valve is tricuspid. Aortic valve regurgitation is not  visualized. No aortic stenosis is present.  6. Aortic dilatation noted. Aneurysm of the ascending aorta, measuring 41  mm. There is mild dilatation at the level of the sinuses of Valsalva  measuring 37 mm.  7. The inferior vena cava is dilated in size with >50% respiratory  variability, suggesting right atrial pressure of 8 mmHg.   Myoview stress test 05/07/2020  There was no ST segment deviation noted during stress.  No T wave inversion was noted during stress.  Findings consistent with prior myocardial infarction with peri-infarct ischemia.  The left ventricular ejection fraction is severely decreased (<30%).  No prior study for comparison.   There is significant perfusion defect of the entire anterior and inferior wall that is slightly worse at stress. However, does not appear to be severely abnormal, and distribution does not explain global hypokinesis. Likely infarct with small amount of worsening ischemia with stress, but cardiomyopathy out of proportion to ischemia/infarction.  Laboratory Data:  High Sensitivity Troponin:   Recent Labs  Lab 07/17/20 0802 07/17/20 1002  TROPONINIHS 15 17     Chemistry Recent Labs  Lab 07/17/20 0802  NA 136  K 4.2  CL 102  CO2 22  GLUCOSE 125*  BUN 46*  CREATININE 2.10*  CALCIUM 9.1  GFRNONAA 31*  ANIONGAP 12    Recent Labs  Lab 07/17/20 0802  PROT 6.8  ALBUMIN 3.2*  AST 17  ALT 14  ALKPHOS 63  BILITOT 0.7   Hematology Recent Labs  Lab 07/17/20 0802  WBC 5.9  RBC 5.10  HGB 14.3  HCT 46.1  MCV 90.4  MCH 28.0  MCHC 31.0  RDW 13.3  PLT 326   BNP Recent Labs  Lab 07/17/20 0802  BNP 686.0*    DDimer No results for input(s): DDIMER in the last 168 hours.   Radiology/Studies:  DG Chest Portable 1 View  Result Date: 07/17/2020 CLINICAL DATA:  Syncope EXAM: PORTABLE CHEST 1 VIEW  COMPARISON:  05/29/2020 FINDINGS: The heart size and mediastinal contours are within normal limits. Both lungs are clear. The visualized skeletal structures are unremarkable. IMPRESSION: No active disease. Electronically Signed   By: Fidela Salisbury MD   On: 07/17/2020 08:35     Assessment and Plan:   Abnormal EKG - presents with seizure. Roommate called EMS. Pt denies chest pain or sob. - EKG showed possible code STEMI however no chest pain reported. It appears toshow NSR with LBBB with no significant ischemic changes.  - HS troponin 15>17.  - EF<20%. Pt had myoview stress in 04/2020 for pre-op eval showing perfusion defects in entire anterior and inferior wall that were slightly worse at stress. Likely infarct with small amount of wrisening ischemia with stress, but CM out of proportion to ischemia finding. Plan was for CHF directed therapy and re-check an echo in 3 months. If still low would plan for cath - With patient's known non-compliance do not suspect any further work-up. Will discuss with MD.   Chronic systolic CHF - Patient  has known low EF<20%. Stress test and plan as above - BNP 686 on admission. CXR non acute.  - Pt says he has not been taking his meds the last couple days because he needs to go pick them up from the [place].  - PTA coreg 3.125mg  BID, lasix 4 mg daily, Bidil 20-37.5 TID - renal dysfunction limiting CHF meds - Appears relatively euvolemic on exam - Needs repeat echo however is noncompliant  Seizure - known seizure history - reports  hasn't had meds in a couple days   Polysubstance abuse - pt reported recent cocaine use - still smoking  CKD stage 3 - creatinine 2.10, around baseline  For questions or updates, please contact [place] Please consult [email or web address] for contact info under    Signed, Cadence Ninfa Meeker, PA-C  07/17/2020 1:06 PM   I have seen and examined the patient along with Cadence Ninfa Meeker, PA-C.  I have reviewed the chart, notes and new  data.  I agree with PA/NP's note.  Key new complaints: he denies angina and dyspnea. He is confident he had a seizure. "Felt like the others". He di have postictal confusion and is still a little drowsy. He has been noncompliant with all his cardiac meds, has not come to scheduled office visits at our practice and has not seen his usual practitioner in the [place] system "in a while". He used cocaine as recently as two days ago. Key examination changes: displaced apical impulse, anterior axillary line 6th IC space, paradoxically split S2, no S3, no JVD or edema, lying flat without respiratory difficulty, no rales. Key new findings / data:   - ECGs (by EMS and in ED) all show SR and chronic LBBB. There is an axis change (extreme right axis deviation) and a slight increase in the degree of discordant ST elevation in leads V1-V4 on the initial ECG at 07:25, that returns to baseline (left axis deviation and 3 mm discordant ST elevation V1-V4 on the subsequent tracings). In the presence of LBBB, discordant ST elevation of > 5 mm from baseline may be meaningful for ischemia. Lesser degrees of discordant ST deviation are nonspecific and are not meaningful.  - hs troponin is low x 2 -  He has had ten consecutive UDS +ve for cocaine (not one negative screen) since August 2019. - GFR around 25-30 (chronic, a little better tan usual today at creat 2.10).  PLAN: The ECG changes are nonspecific and are not diagnostic for ischemia. He is not in acute heart failure and cardiac rhythm is normal. He has a severe cardiomyopathy that has never been fully completely worked up due to his inability to comply with follow up or abstain from the use of cocaine. 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 Alcohol related CMP is also a possibility. His LBBB could well be responsible for the minor abnormalities seen on his perfusion  scan. Catheterization would serve little purpose unless he can show ability to comply with necessary treatment after revascularization. On the other hand, it would place him at substantial risk for kidney failure. We ill make him an appointment for follow up. I have stressed the critical importance of cocaine abstinence - he is at high risk for sudden arrhythmic death. Unsafe to prescribe beta blockers at this time.  No plan for inpatient workup or treatment at this time.  Sanda Klein, MD, [place] [phone] 07/17/2020, 2:44 PM

## 2020-07-17 NOTE — Discharge Instructions (Signed)
Take your medicines and follow-up with your doctors.

## 2020-07-17 NOTE — ED Triage Notes (Addendum)
BIB [facility] due to patient having a seizure at home. Roommate witnessed later end of seizure, but per EMS and roommate seizure may have lasted about 1 minute. Denies any fall with seizure. Per EMS pt has had difficulty with obtaining daily medications. Pt gets all healthcare from [place] and has not been able to get over there to get meds. EKG showed LBB. Denies chest pain, SOB, dizziness. NAD at this time.   VS w/ EMS- BP- 156/90 HR-60 Resp-16 SpO2- 96% CBG-215

## 2020-07-17 NOTE — Discharge Planning (Signed)
Pt active at [facility] Team) MD: Roxanna Mew SW: Blain Pais   Desk phone: [phone] x [phone]

## 2020-08-17 NOTE — Progress Notes (Deleted)
Cardiology Office Note   Date:  08/17/2020   ID:  Tim Bradley, DOB 06-10-1951, MRN [id number]  PCP:  [facility]  Cardiologist:  Dr. Johnsie Cancel, MD   No chief complaint on file.   History of Present Illness: Tim Bradley is a 69 y.o. male who presents for follow up, seen for Dr. Johnsie Cancel.   Mr. Tim Bradley has a history of hypertension, systolic CHF, chronic kidney disease stage IV, seizure disorder, cocaine abuse, DM2, and tobacco and alcohol use who appears to have only been seen by cardiology in multiple hospital consultations 03/2020, 04/2020 and most recently 07/17/2020.  Mr. Tim Bradley was initially seen by [facility] during an admission for CHF. UDS was positive for cocaine at that time.  BNP was elevated at 1366 and he was therefore admitted for IV diuretics and started on carvedilol and Entresto due to an LVEF found to be less than 20% with G1 DD, mildly reduced RV function, trivial MI and aortic dilation noted to be 41 mm.  Plan was for GDMT and repeat echocardiogram 3 months later.  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  Unfortunately, he left the hospital AMA and was readmitted 04/22/2020 and underwent further diuresis.  Stress test performed 04/2020 for preop evaluation showed perfusion defects in the entire anterior and inferior wall with small amount of ischemia with stress however cardiomyopathy felt to be out of proportion to ischemia finding.  He was most recently seen by her service 07/17/2020.  He initially came to the ED for seizures.  Cardiology was consulted for an elevated troponin and an abnormal EKG which showed possible STEMI however code was deferred given the absence of chest pain.  Given his history of noncompliance and ongoing cocaine abuse, no further ischemic work-up was initiated.  Cardiology follow-up was made.  He is at high risk for sudden arrhythmic death therefore beta-blockers were not prescribed due to ongoing cocaine use.  Mr. Tim Bradley has   1.   Chronic systolic CHF: -Patient has a known LVEF at less than 20.  Last Myoview stress test 04/2020 for preoperative evaluation showed perfusion defects in the entire anterior and inferior wall that were slightly worse with stress likely indicating infarct with small amount of ischemia with stress however cardiomyopathy was out of proportion to ischemia findings -Plan was for guideline directed therapy and repeat echocardiogram -Has known noncompliance therefore no further cardiac work-up despite EKG changes was pursued -Continue carvedilol 3.125 mg twice daily, Lasix 40 mg daily, BiDil 20-30 7.5 3 times daily -Appears euvolemic  2.  Polysubstance use: -Has had multiple ED admissions with UDS positive for cocaine at every admission -Continues to smoke tobacco daily -Cessation strongly encouraged  3.  CKD stage III: -Baseline appears to be in the 2.1 range    Past Medical History:  Diagnosis Date  . Abscess 02/2017   LEFT GLUTEAL   . Acid reflux   . Alcohol abuse   . Anemia   . CHF (congestive heart failure) ([place])   . 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 Cocaine abuse ([place])   . DDD (degenerative disc disease), lumbar   . Diabetes mellitus without complication ([place])   . Dyspnea   . Enlarged prostate   . Hypertension   . Seizures ([place])   . Spinal stenosis, lumbar     Past Surgical History:  Procedure Laterality Date  . BACK SURGERY     cervical x2  . BIOPSY  08/21/2018   Procedure: BIOPSY;  Surgeon: Ronald Lobo, MD;  Location: [place];  Service: Endoscopy;;  . ESOPHAGOGASTRODUODENOSCOPY (EGD) WITH PROPOFOL N/A 08/21/2018   Procedure: ESOPHAGOGASTRODUODENOSCOPY (EGD) WITH PROPOFOL;  Surgeon: Ronald Lobo, MD;  Location: [place];  Service: Endoscopy;  Laterality: N/A;  . INCISION AND DRAINAGE PERIRECTAL ABSCESS N/A 03/26/2017   Procedure: IRRIGATION AND DEBRIDEMENT PERIRECTAL ABSCESS;  Surgeon: Rolm Bookbinder, MD;   Location: [place];  Service: General;  Laterality: N/A;     Current Outpatient Medications  Medication Sig Dispense Refill  . acetaminophen (TYLENOL) 325 MG tablet Take 2 tablets (650 mg total) by mouth every 4 (four) hours as needed for headache or mild pain.    Marland Kitchen aspirin 81 MG EC tablet Take 1 tablet (81 mg total) by mouth daily. Swallow whole. 30 tablet 0  . carvedilol (COREG) 3.125 MG tablet Take 1 tablet (3.125 mg total) by mouth 2 (two) times daily with a meal. 60 tablet 0  . divalproex (DEPAKOTE) 500 MG DR tablet Take 1 tablet (500 mg total) by mouth every 12 (twelve) hours. 60 tablet 1  . folic acid (FOLVITE) 1 MG tablet Take 1 tablet (1 mg total) by mouth daily. (Patient not taking: Reported on 07/17/2020)    . furosemide (LASIX) 40 MG tablet Take 1 tablet (40 mg total) by mouth daily as needed for fluid or edema. (Patient not taking: Reported on 07/17/2020) 3 tablet 0  . isosorbide-hydrALAZINE (BIDIL) 20-37.5 MG tablet Take 1 tablet by mouth 3 (three) times daily. 90 tablet 0  . levETIRAcetam (KEPPRA) 500 MG tablet Take 1 tablet (500 mg total) by mouth 2 (two) times daily. 60 tablet 0  . Multiple Vitamin (MULTIVITAMIN WITH MINERALS) TABS tablet Take 1 tablet by mouth daily. (Patient not taking: Reported on 07/17/2020)    . tamsulosin (FLOMAX) 0.4 MG CAPS capsule Take 1 capsule (0.4 mg total) by mouth daily after supper. 30 capsule 0  . thiamine 100 MG tablet Take 1 tablet (100 mg total) by mouth daily. (Patient not taking: Reported on 07/17/2020)     No current facility-administered medications for this visit.    Allergies:   Patient has no known allergies.    Social History:  The patient  reports that he has been smoking cigarettes. He has been smoking about 0.25 packs per day. He has never used smokeless tobacco. He reports current alcohol use of about 12.0 standard drinks of alcohol per week. He reports current drug use. Drugs: Cocaine and Marijuana.   Family History:  The  patient's ***family history includes Cancer - Lung in his father; Hypertension in his mother.    ROS:  Please see the history of present illness.   Otherwise, review of systems are positive for {NONE DEFAULTED:18576::"none"}.   All other systems are reviewed and negative.    PHYSICAL EXAM: VS:  There were no vitals taken for this visit. , BMI There is no height or weight on file to calculate BMI. GEN: Well nourished, well developed, in no acute distress HEENT: normal Neck: no JVD, carotid bruits, or masses Cardiac: ***RRR; no murmurs, rubs, or gallops,no edema  Respiratory:  clear to auscultation bilaterally, normal work of breathing GI: soft, nontender, nondistended, + BS MS: no deformity or atrophy Skin: warm and dry, no rash Neuro:  Strength and sensation are intact Psych: euthymic mood, full affect   EKG:  EKG {ACTION; IS/IS BDZ:32992426} ordered today. The ekg ordered today demonstrates ***   Recent Labs: 04/11/2020: TSH 1.213 07/01/2020: Magnesium  1.8 07/17/2020: ALT 14; B Natriuretic Peptide 686.0; BUN 46; Creatinine, Ser 2.10; Hemoglobin 14.3; Platelets 326; Potassium 4.2; Sodium 136    Lipid Panel No results found for: CHOL, TRIG, HDL, CHOLHDL, VLDL, LDLCALC, LDLDIRECT    Wt Readings from Last 3 Encounters:  05/09/20 216 lb 11.4 oz (98.3 kg)  04/26/20 (!) 203 lb 11.2 oz (92.4 kg)  04/13/20 212 lb (96.2 kg)      Other studies Reviewed: Additional studies/ records that were reviewed today include: ***. Review of the above records demonstrates: ***  Echo 04/11/20 1. Left ventricular ejection fraction, by estimation, is <20%. The left  ventricle has severely decreased function. The left ventricle demonstrates  global hypokinesis. There is moderate concentric left ventricular  hypertrophy. Left ventricular diastolic  parameters are consistent with Grade I diastolic dysfunction (impaired  relaxation).  2. Right ventricular systolic function is mildly reduced. The  right  ventricular size is normal. There is normal pulmonary artery systolic  pressure.  3. Left atrial size was mildly dilated.  4. The mitral valve is normal in structure. Trivial mitral valve  regurgitation. No evidence of mitral stenosis.  5. The aortic valve is tricuspid. Aortic valve regurgitation is not  visualized. No aortic stenosis is present.  6. Aortic dilatation noted. Aneurysm of the ascending aorta, measuring 41  mm. There is mild dilatation at the level of the sinuses of Valsalva  measuring 37 mm.  7. The inferior vena cava is dilated in size with >50% respiratory  variability, suggesting right atrial pressure of 8 mmHg.   Myoview stress test 05/07/2020  There was no ST segment deviation noted during stress.  No T wave inversion was noted during stress.  Findings consistent with prior myocardial infarction with peri-infarct ischemia.  The left ventricular ejection fraction is severely decreased (<30%).  No prior study for comparison.  There is significant perfusion defect of the entire anterior and inferior wall that is slightly worse at stress. However, does not appear to be severely abnormal, and distribution does not explain global hypokinesis. Likely infarct with small amount of worsening ischemia with stress, but cardiomyopathy out of proportion to ischemia/infarction.    ASSESSMENT AND PLAN:  1.  ***   Current medicines are reviewed at length with the patient today.  The patient {ACTIONS; HAS/DOES NOT HAVE:19233} concerns regarding medicines.  The following changes have been made:  {PLAN; NO CHANGE:13088:s}  Labs/ tests ordered today include: *** No orders of the defined types were placed in this encounter.    Disposition:   FU with *** in {gen number 5-02:774128} {Days to years:10300}  Signed, Kathyrn Drown, NP  08/17/2020 2:27 PM    [facility] [place], [place], [place]  [address] Phone: [phone]; Fax:  [phone]

## 2020-08-21 ENCOUNTER — Ambulatory Visit: Payer: Medicare Other | Admitting: Cardiology

## 2020-09-28 ENCOUNTER — Emergency Department (HOSPITAL_COMMUNITY): Payer: Medicare (Managed Care)

## 2020-09-28 ENCOUNTER — Other Ambulatory Visit: Payer: Self-pay

## 2020-09-28 ENCOUNTER — Inpatient Hospital Stay (HOSPITAL_COMMUNITY)
Admission: EM | Admit: 2020-09-28 | Discharge: 2020-10-01 | DRG: 300 | Disposition: A | Discharge status: Home Health | Payer: Medicare (Managed Care) | Attending: Family Medicine | Admitting: Family Medicine

## 2020-09-28 ENCOUNTER — Encounter (HOSPITAL_COMMUNITY): Payer: Self-pay

## 2020-09-28 DIAGNOSIS — N400 Enlarged prostate without lower urinary tract symptoms: Secondary | ICD-10-CM | POA: Diagnosis present

## 2020-09-28 DIAGNOSIS — F444 Conversion disorder with motor symptom or deficit: Secondary | ICD-10-CM | POA: Diagnosis present

## 2020-09-28 DIAGNOSIS — Z20822 Contact with and (suspected) exposure to covid-19: Secondary | ICD-10-CM | POA: Diagnosis present

## 2020-09-28 DIAGNOSIS — I82411 Acute embolism and thrombosis of right femoral vein: Principal | ICD-10-CM | POA: Diagnosis present

## 2020-09-28 DIAGNOSIS — G9340 Encephalopathy, unspecified: Secondary | ICD-10-CM | POA: Diagnosis not present

## 2020-09-28 DIAGNOSIS — G40909 Epilepsy, unspecified, not intractable, without status epilepticus: Secondary | ICD-10-CM

## 2020-09-28 DIAGNOSIS — I429 Cardiomyopathy, unspecified: Secondary | ICD-10-CM | POA: Diagnosis present

## 2020-09-28 DIAGNOSIS — R569 Unspecified convulsions: Secondary | ICD-10-CM

## 2020-09-28 DIAGNOSIS — E875 Hyperkalemia: Secondary | ICD-10-CM | POA: Diagnosis not present

## 2020-09-28 DIAGNOSIS — I82431 Acute embolism and thrombosis of right popliteal vein: Secondary | ICD-10-CM | POA: Diagnosis present

## 2020-09-28 DIAGNOSIS — Z8249 Family history of ischemic heart disease and other diseases of the circulatory system: Secondary | ICD-10-CM

## 2020-09-28 DIAGNOSIS — Z91138 Patient's unintentional underdosing of medication regimen for other reason: Secondary | ICD-10-CM

## 2020-09-28 DIAGNOSIS — F17210 Nicotine dependence, cigarettes, uncomplicated: Secondary | ICD-10-CM | POA: Diagnosis present

## 2020-09-28 DIAGNOSIS — N179 Acute kidney failure, unspecified: Secondary | ICD-10-CM

## 2020-09-28 DIAGNOSIS — I447 Left bundle-branch block, unspecified: Secondary | ICD-10-CM | POA: Diagnosis present

## 2020-09-28 DIAGNOSIS — I824Y1 Acute embolism and thrombosis of unspecified deep veins of right proximal lower extremity: Secondary | ICD-10-CM | POA: Diagnosis present

## 2020-09-28 DIAGNOSIS — I5042 Chronic combined systolic (congestive) and diastolic (congestive) heart failure: Secondary | ICD-10-CM | POA: Diagnosis present

## 2020-09-28 DIAGNOSIS — I130 Hypertensive heart and chronic kidney disease with heart failure and stage 1 through stage 4 chronic kidney disease, or unspecified chronic kidney disease: Secondary | ICD-10-CM | POA: Diagnosis present

## 2020-09-28 DIAGNOSIS — T426X6A Underdosing of other antiepileptic and sedative-hypnotic drugs, initial encounter: Secondary | ICD-10-CM | POA: Diagnosis present

## 2020-09-28 DIAGNOSIS — I82401 Acute embolism and thrombosis of unspecified deep veins of right lower extremity: Secondary | ICD-10-CM | POA: Diagnosis present

## 2020-09-28 DIAGNOSIS — G40409 Other generalized epilepsy and epileptic syndromes, not intractable, without status epilepticus: Secondary | ICD-10-CM | POA: Diagnosis present

## 2020-09-28 DIAGNOSIS — Z993 Dependence on wheelchair: Secondary | ICD-10-CM

## 2020-09-28 DIAGNOSIS — I82441 Acute embolism and thrombosis of right tibial vein: Secondary | ICD-10-CM | POA: Diagnosis present

## 2020-09-28 DIAGNOSIS — F1410 Cocaine abuse, uncomplicated: Secondary | ICD-10-CM | POA: Diagnosis present

## 2020-09-28 DIAGNOSIS — I5022 Chronic systolic (congestive) heart failure: Secondary | ICD-10-CM

## 2020-09-28 DIAGNOSIS — I10 Essential (primary) hypertension: Secondary | ICD-10-CM | POA: Diagnosis present

## 2020-09-28 DIAGNOSIS — N183 Chronic kidney disease, stage 3 unspecified: Secondary | ICD-10-CM | POA: Diagnosis present

## 2020-09-28 DIAGNOSIS — E1122 Type 2 diabetes mellitus with diabetic chronic kidney disease: Secondary | ICD-10-CM | POA: Diagnosis present

## 2020-09-28 LAB — CBC
HCT: 39.2 % (ref 39.0–52.0)
Hemoglobin: 12.7 g/dL — ABNORMAL LOW (ref 13.0–17.0)
MCH: 28.3 pg (ref 26.0–34.0)
MCHC: 32.4 g/dL (ref 30.0–36.0)
MCV: 87.3 fL (ref 80.0–100.0)
Platelets: 237 10*3/uL (ref 150–400)
RBC: 4.49 MIL/uL (ref 4.22–5.81)
RDW: 13.6 % (ref 11.5–15.5)
WBC: 8.8 10*3/uL (ref 4.0–10.5)
nRBC: 0 % (ref 0.0–0.2)

## 2020-09-28 LAB — COMPREHENSIVE METABOLIC PANEL
ALT: 16 U/L (ref 0–44)
AST: 25 U/L (ref 15–41)
Albumin: 3.5 g/dL (ref 3.5–5.0)
Alkaline Phosphatase: 65 U/L (ref 38–126)
Anion gap: 13 (ref 5–15)
BUN: 78 mg/dL — ABNORMAL HIGH (ref 8–23)
CO2: 18 mmol/L — ABNORMAL LOW (ref 22–32)
Calcium: 9.1 mg/dL (ref 8.9–10.3)
Chloride: 99 mmol/L (ref 98–111)
Creatinine, Ser: 3.26 mg/dL — ABNORMAL HIGH (ref 0.61–1.24)
GFR, Estimated: 19 mL/min — ABNORMAL LOW (ref >60)
Glucose, Bld: 166 mg/dL — ABNORMAL HIGH (ref 70–99)
Potassium: 5.3 mmol/L — ABNORMAL HIGH (ref 3.5–5.1)
Sodium: 130 mmol/L — ABNORMAL LOW (ref 135–145)
Total Bilirubin: 0.6 mg/dL (ref 0.3–1.2)
Total Protein: 7.7 g/dL (ref 6.5–8.1)

## 2020-09-28 LAB — CBG MONITORING, ED: Glucose-Capillary: 159 mg/dL — ABNORMAL HIGH (ref 70–99)

## 2020-09-28 LAB — URINALYSIS, ROUTINE W REFLEX MICROSCOPIC
Bacteria, UA: NONE SEEN
Bilirubin Urine: NEGATIVE
Glucose, UA: NEGATIVE mg/dL
Ketones, ur: NEGATIVE mg/dL
Nitrite: NEGATIVE
Protein, ur: 100 mg/dL — AB
Specific Gravity, Urine: 1.015 (ref 1.005–1.030)
WBC, UA: >50 WBC/hpf — ABNORMAL HIGH (ref 0–5)
pH: 5 (ref 5.0–8.0)

## 2020-09-28 LAB — RAPID URINE DRUG SCREEN, HOSP PERFORMED
Amphetamines: NOT DETECTED
Barbiturates: NOT DETECTED
Benzodiazepines: NOT DETECTED
Cocaine: POSITIVE — AB
Opiates: NOT DETECTED
Tetrahydrocannabinol: NOT DETECTED

## 2020-09-28 LAB — CBC WITH DIFFERENTIAL/PLATELET
Abs Immature Granulocytes: 0.03 10*3/uL (ref 0.00–0.07)
Basophils Absolute: 0 10*3/uL (ref 0.0–0.1)
Basophils Relative: 0 %
Eosinophils Absolute: 0.1 10*3/uL (ref 0.0–0.5)
Eosinophils Relative: 2 %
HCT: 40.5 % (ref 39.0–52.0)
Hemoglobin: 13.1 g/dL (ref 13.0–17.0)
Immature Granulocytes: 0 %
Lymphocytes Relative: 9 %
Lymphs Abs: 0.7 10*3/uL (ref 0.7–4.0)
MCH: 28 pg (ref 26.0–34.0)
MCHC: 32.3 g/dL (ref 30.0–36.0)
MCV: 86.5 fL (ref 80.0–100.0)
Monocytes Absolute: 0.7 10*3/uL (ref 0.1–1.0)
Monocytes Relative: 9 %
Neutro Abs: 6.5 10*3/uL (ref 1.7–7.7)
Neutrophils Relative %: 80 %
Platelets: 233 10*3/uL (ref 150–400)
RBC: 4.68 MIL/uL (ref 4.22–5.81)
RDW: 13.5 % (ref 11.5–15.5)
WBC: 8.1 10*3/uL (ref 4.0–10.5)
nRBC: 0 % (ref 0.0–0.2)

## 2020-09-28 LAB — CREATININE, SERUM
Creatinine, Ser: 3.23 mg/dL — ABNORMAL HIGH (ref 0.61–1.24)
GFR, Estimated: 20 mL/min — ABNORMAL LOW (ref >60)

## 2020-09-28 LAB — VALPROIC ACID LEVEL: Valproic Acid Lvl: <10 ug/mL — ABNORMAL LOW (ref 50.0–100.0)

## 2020-09-28 LAB — ETHANOL: Alcohol, Ethyl (B): <10 mg/dL (ref <10)

## 2020-09-28 MED ORDER — SODIUM ZIRCONIUM CYCLOSILICATE 5 G PO PACK: 5.0000 g | PACK | Freq: Once | ORAL | Status: DC

## 2020-09-28 MED ORDER — LEVETIRACETAM IN NACL 500 MG/100ML IV SOLN
500.0000 mg | Freq: Two times a day (BID) | INTRAVENOUS | Status: DC
  Administered 2020-09-29: 500 mg via INTRAVENOUS
  Filled 2020-09-28: qty 100

## 2020-09-28 MED ORDER — HYDRALAZINE HCL 20 MG/ML IJ SOLN: 10.0000 mg | INTRAMUSCULAR | Status: DC | PRN

## 2020-09-28 MED ORDER — VALPROATE SODIUM 100 MG/ML IV SOLN
500.0000 mg | Freq: Two times a day (BID) | INTRAVENOUS | Status: DC
  Administered 2020-09-29: 500 mg via INTRAVENOUS
  Filled 2020-09-28: qty 5

## 2020-09-28 MED ORDER — VALPROATE SODIUM 100 MG/ML IV SOLN
500.0000 mg | Freq: Once | INTRAVENOUS | Status: AC
  Administered 2020-09-28: 500 mg via INTRAVENOUS
  Filled 2020-09-28: qty 5

## 2020-09-28 MED ORDER — LEVETIRACETAM IN NACL 1000 MG/100ML IV SOLN
1000.0000 mg | Freq: Once | INTRAVENOUS | Status: AC
  Administered 2020-09-28: 1000 mg via INTRAVENOUS
  Filled 2020-09-28: qty 100

## 2020-09-28 MED ORDER — HEPARIN SODIUM (PORCINE) 5000 UNIT/ML IJ SOLN
5000.0000 [IU] | Freq: Three times a day (TID) | INTRAMUSCULAR | Status: DC
  Administered 2020-09-29 – 2020-09-30 (×4): 5000 [IU] via SUBCUTANEOUS
  Filled 2020-09-28 (×5): qty 1

## 2020-09-28 NOTE — H&P (Signed)
History and Physical    Tim Bradley VPX:[id number] DOB: 11/21/1950 DOA: 09/28/2020  PCP: Tim Bradley  Patient coming from: Home.  Chief Complaint: Seizures.  HPI: Tim Bradley is a 69 y.o. male with history of seizures, cocaine abuse, diabetes mellitus on diet, chronic kidney disease baseline creatinine around 2.1, cardiomyopathy and chronic LBBB was brought to the ER after patient's family witnessed generalized tonic-clonic seizure and was brought to the ER.  Per patient patient has not been taking his medications for at least 2 to 3 weeks since he did not have refills.  Admits to taking cocaine.  Patient states she has not had any alcohol for many days and has not been drinking regularly.  ED Course: In the ER patient was found to be confused and postictal and remained postictal for some time.  Patient's labs are significant for worsening renal function from baseline of 2.1 hours and 3.2 and valproic acid levels were undetectable.  CT head was unremarkable.  ER physician discussed with on-call neurologist who advised loading with Keppra and Depakote.  Since patient was still postictal admitted for further observation.  Drug screen is positive for cocaine EKG shows LBBB which is at baseline.  Alcohol also negative.  Review of Systems: As per HPI, rest all negative.   Past Medical History:  Diagnosis Date  . Abscess 02/2017   LEFT GLUTEAL   . Acid reflux   . Alcohol abuse   . Anemia   . CHF (congestive heart failure) ([place])   . 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 Cocaine abuse ([place])   . DDD (degenerative disc disease), lumbar   . Diabetes mellitus without complication ([place])   . Dyspnea   . Enlarged prostate   . Hypertension   . Seizures ([place])   . Spinal stenosis, lumbar     Past Surgical History:  Procedure Laterality Date  . BACK SURGERY     cervical x2  . BIOPSY  08/21/2018   Procedure: BIOPSY;  Surgeon: Ronald Lobo, MD;   Location: [place];  Service: Endoscopy;;  . ESOPHAGOGASTRODUODENOSCOPY (EGD) WITH PROPOFOL N/A 08/21/2018   Procedure: ESOPHAGOGASTRODUODENOSCOPY (EGD) WITH PROPOFOL;  Surgeon: Ronald Lobo, MD;  Location: [place];  Service: Endoscopy;  Laterality: N/A;  . INCISION AND DRAINAGE PERIRECTAL ABSCESS N/A 03/26/2017   Procedure: IRRIGATION AND DEBRIDEMENT PERIRECTAL ABSCESS;  Surgeon: Rolm Bookbinder, MD;  Location: [place];  Service: General;  Laterality: N/A;     reports that he has been smoking cigarettes. He has been smoking about 0.25 packs per day. He has never used smokeless tobacco. He reports current alcohol use of about 12.0 standard drinks of alcohol per week. He reports current drug use. Drugs: Cocaine and Marijuana.  No Known Allergies  Family History  Problem Relation Age of Onset  . Hypertension Mother   . Cancer - Lung Father     Prior to Admission medications   Medication Sig Start Date End Date Taking? Authorizing Provider  acetaminophen (TYLENOL) 325 MG tablet Take 2 tablets (650 mg total) by mouth every 4 (four) hours as needed for headache or mild pain. 05/09/20   Terrilee Croak, MD  aspirin 81 MG EC tablet Take 1 tablet (81 mg total) by mouth daily. Swallow whole. 07/17/20   Davonna Belling, MD  carvedilol (COREG) 3.125 MG tablet Take 1 tablet (3.125 mg total) by mouth 2 (two) times daily with a meal. 04/13/20   Kerney Elbe, DO  divalproex (DEPAKOTE) 500 MG DR tablet Take 1 tablet (500 mg total) by mouth every 12 (twelve) hours. 07/17/20 09/15/20  Davonna Belling, MD  folic acid (FOLVITE) 1 MG tablet Take 1 tablet (1 mg total) by mouth daily. Patient not taking: Reported on 07/17/2020 05/10/20   Terrilee Croak, MD  furosemide (LASIX) 40 MG tablet Take 1 tablet (40 mg total) by mouth daily as needed for fluid or edema. Patient not taking: Reported on 07/17/2020 05/09/20   Terrilee Croak, MD  isosorbide-hydrALAZINE (BIDIL) 20-37.5 MG tablet Take 1 tablet by  mouth 3 (three) times daily. 07/17/20   Davonna Belling, MD  levETIRAcetam (KEPPRA) 500 MG tablet Take 1 tablet (500 mg total) by mouth 2 (two) times daily. 07/17/20   Davonna Belling, MD  Multiple Vitamin (MULTIVITAMIN WITH MINERALS) TABS tablet Take 1 tablet by mouth daily. Patient not taking: Reported on 07/17/2020 05/10/20   Terrilee Croak, MD  tamsulosin (FLOMAX) 0.4 MG CAPS capsule Take 1 capsule (0.4 mg total) by mouth daily after supper. 01/27/20   Deno Etienne, DO  thiamine 100 MG tablet Take 1 tablet (100 mg total) by mouth daily. Patient not taking: Reported on 07/17/2020 05/10/20   Terrilee Croak, MD    Physical Exam: Constitutional: Moderately built and nourished. Vitals:   09/28/20 1921 09/28/20 2015 09/28/20 2100  BP: (!) 145/92 140/85 (!) 141/96  Pulse: 98 89 87  Resp: 20 19 (!) 22  Temp: 98.7 F (37.1 C)    TempSrc: Oral    SpO2: 93% 96% 96%   Eyes: Anicteric no pallor. ENMT: No discharge from the ears eyes nose or mouth. Neck: No mass felt.  No neck rigidity. Respiratory: No rhonchi or crepitations. Cardiovascular: S1-S2 heard. Abdomen: Soft nontender bowel sounds present. Musculoskeletal: No edema. Skin: No rash. Neurologic: Mildly lethargic but answers questions appropriately and moves all extremities pupils equal and reacting to light. Psychiatric: Mildly lethargic but answers questions appropriately.   Labs on Admission: I have personally reviewed following labs and imaging studies  CBC: Recent Labs  Lab 09/28/20 1927  WBC 8.1  NEUTROABS 6.5  HGB 13.1  HCT 40.5  MCV 86.5  PLT 132   Basic Metabolic Panel: Recent Labs  Lab 09/28/20 1927  NA 130*  K 5.3*  CL 99  CO2 18*  GLUCOSE 166*  BUN 78*  CREATININE 3.26*  CALCIUM 9.1   GFR: CrCl cannot be calculated (Unknown ideal weight.). Liver Function Tests: Recent Labs  Lab 09/28/20 1927  AST 25  ALT 16  ALKPHOS 65  BILITOT 0.6  PROT 7.7  ALBUMIN 3.5   No results for input(s): LIPASE,  AMYLASE in the last 168 hours. No results for input(s): AMMONIA in the last 168 hours. Coagulation Profile: No results for input(s): INR, PROTIME in the last 168 hours. Cardiac Enzymes: No results for input(s): CKTOTAL, CKMB, CKMBINDEX, TROPONINI in the last 168 hours. BNP (last 3 results) No results for input(s): PROBNP in the last 8760 hours. HbA1C: No results for input(s): HGBA1C in the last 72 hours. CBG: Recent Labs  Lab 09/28/20 1924  GLUCAP 159*   Lipid Profile: No results for input(s): CHOL, HDL, LDLCALC, TRIG, CHOLHDL, LDLDIRECT in the last 72 hours. Thyroid Function Tests: No results for input(s): TSH, T4TOTAL, FREET4, T3FREE, THYROIDAB in the last 72 hours. Anemia Panel: No results for input(s): VITAMINB12, FOLATE, FERRITIN, TIBC, IRON, RETICCTPCT in the last 72 hours. Urine analysis:    Component Value Date/Time   COLORURINE YELLOW 05/02/2020 [place]  05/02/2020 1618   LABSPEC 1.013 05/02/2020 1618   PHURINE 7.0 05/02/2020 1618   GLUCOSEU NEGATIVE 05/02/2020 1618   HGBUR NEGATIVE 05/02/2020 1618   BILIRUBINUR NEGATIVE 05/02/2020 1618   KETONESUR NEGATIVE 05/02/2020 1618   PROTEINUR 100 (A) 05/02/2020 1618   UROBILINOGEN 0.2 03/11/2012 2012   NITRITE NEGATIVE 05/02/2020 1618   LEUKOCYTESUR TRACE (A) 05/02/2020 1618   Sepsis Labs: @LABRCNTIP (procalcitonin:4,lacticidven:4) )No results found for this or any previous visit (from the past 240 hour(s)).   Radiological Exams on Admission: CT Head Wo Contrast  Result Date: 09/28/2020 CLINICAL DATA:  Seizures.  Suspected cerebral hemorrhage. EXAM: CT HEAD WITHOUT CONTRAST TECHNIQUE: Contiguous axial images were obtained from the base of the skull through the vertex without intravenous contrast. COMPARISON:  None. FINDINGS: Brain: No evidence of acute infarction, hemorrhage, hydrocephalus, extra-axial collection or mass lesion/mass effect. Low-attenuation changes in the deep white matter consistent  small vessel ischemia. Mild atrophy. Vascular: Mild intracranial arterial vascular calcifications. Skull: The calvarium appears intact. Sinuses/Orbits: Paranasal sinuses and mastoid air cells are clear. Other: None. IMPRESSION: 1. No acute intracranial abnormalities. 2. Mild atrophy and small vessel ischemia. Electronically Signed   By: Lucienne Capers M.D.   On: 09/28/2020 21:39   DG Chest Port 1 View  Result Date: 09/28/2020 CLINICAL DATA:  Seizure and altered mental status. EXAM: PORTABLE CHEST 1 VIEW COMPARISON:  Radiograph 07/17/2020 FINDINGS: Lower lung volumes from prior exam leading to bronchovascular crowding. Possible vascular congestion. Mild cardiomegaly. Stable mediastinal contours with aortic tortuosity and atherosclerosis. No focal airspace disease, pleural effusion, or pneumothorax. No acute osseous abnormalities are seen. Degenerative change in the right shoulder. Surgical hardware in the lower cervical spine is partially included. IMPRESSION: Lower lung volumes from prior exam. Bronchovascular crowding with possible vascular congestion. Electronically Signed   By: Keith Rake M.D.   On: 09/28/2020 21:21    EKG: Independently reviewed.  Normal sinus rhythm.  LBBB.  Assessment/Plan Principal Problem:   Acute encephalopathy Active Problems:   Cocaine abuse (HCC)   Essential hypertension   Cardiomyopathy ([place])   AKI (acute kidney injury) ([place])    1. Acute encephalopathy likely postictal patient's mental status is gradually improving.  Neurologist recommended loading with Keppra and Depakote which usually patient takes and I have also discussed with pharmacy on further continuing patient's home dose of Depakote and Keppra. 2. Acute on chronic kidney disease stage IV creatinine has worsened from baseline.  UA does show some WBCs and leukocyte esterase.  Probably prerenal.  Will repeat metabolic panel for mild hypokalemia patient was given Lokelma.  If there is any further  worsening of creatinine will give IV fluids. 3. Chronic systolic heart failure with EF last measured in July 2020 was less than 20%.  Patient takes BiDil which I have ordered.  Not restarting Coreg since patient has cocaine positive.  Appears euvolemic at this time and also has worsening renal function so holding off any diuretics. 4. Diabetes mellitus type 2 on diet we will check CBGs with sliding scale coverage. 5. Cocaine abuse advised about quitting. 6. History of alcohol abuse patient states he has not drank any alcohol in many days.  And does not drink every day.   DVT prophylaxis: Heparin. Code Status: Full code. Family Communication: Will need to discuss with family. Disposition Plan: Home. Consults called: ER physician discussed with neurologist. Admission status: Observation.   Rise Patience MD [facility] Pager [phone].  If 7PM-7AM, please contact night-coverage [email or web address] Password [facility]  09/28/2020,  10:13 PM

## 2020-09-28 NOTE — ED Triage Notes (Signed)
Pt arrives EMS for seizure at 1800ish. Per family seizure lasted about a minute and appeared like his "usual seizures". Pt a/o x 4 currently.

## 2020-09-28 NOTE — ED Notes (Signed)
Pt support person sts pt used cocaine last night.

## 2020-09-28 NOTE — ED Notes (Signed)
Seizure pads applied to bed.  
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
## 2020-09-29 ENCOUNTER — Encounter (HOSPITAL_COMMUNITY): Payer: Self-pay | Admitting: Internal Medicine

## 2020-09-29 DIAGNOSIS — N179 Acute kidney failure, unspecified: Secondary | ICD-10-CM | POA: Diagnosis present

## 2020-09-29 DIAGNOSIS — I82431 Acute embolism and thrombosis of right popliteal vein: Secondary | ICD-10-CM | POA: Diagnosis present

## 2020-09-29 DIAGNOSIS — Z91138 Patient's unintentional underdosing of medication regimen for other reason: Secondary | ICD-10-CM | POA: Diagnosis not present

## 2020-09-29 DIAGNOSIS — I82441 Acute embolism and thrombosis of right tibial vein: Secondary | ICD-10-CM | POA: Diagnosis present

## 2020-09-29 DIAGNOSIS — F444 Conversion disorder with motor symptom or deficit: Secondary | ICD-10-CM | POA: Diagnosis present

## 2020-09-29 DIAGNOSIS — E875 Hyperkalemia: Secondary | ICD-10-CM | POA: Diagnosis present

## 2020-09-29 DIAGNOSIS — I82411 Acute embolism and thrombosis of right femoral vein: Secondary | ICD-10-CM | POA: Diagnosis present

## 2020-09-29 DIAGNOSIS — I824Y1 Acute embolism and thrombosis of unspecified deep veins of right proximal lower extremity: Secondary | ICD-10-CM | POA: Diagnosis present

## 2020-09-29 DIAGNOSIS — N400 Enlarged prostate without lower urinary tract symptoms: Secondary | ICD-10-CM | POA: Diagnosis present

## 2020-09-29 DIAGNOSIS — E1122 Type 2 diabetes mellitus with diabetic chronic kidney disease: Secondary | ICD-10-CM | POA: Diagnosis present

## 2020-09-29 DIAGNOSIS — Z993 Dependence on wheelchair: Secondary | ICD-10-CM | POA: Diagnosis not present

## 2020-09-29 DIAGNOSIS — F1410 Cocaine abuse, uncomplicated: Secondary | ICD-10-CM | POA: Diagnosis present

## 2020-09-29 DIAGNOSIS — I429 Cardiomyopathy, unspecified: Secondary | ICD-10-CM | POA: Diagnosis present

## 2020-09-29 DIAGNOSIS — G40409 Other generalized epilepsy and epileptic syndromes, not intractable, without status epilepticus: Secondary | ICD-10-CM | POA: Diagnosis present

## 2020-09-29 DIAGNOSIS — Z8249 Family history of ischemic heart disease and other diseases of the circulatory system: Secondary | ICD-10-CM | POA: Diagnosis not present

## 2020-09-29 DIAGNOSIS — Z20822 Contact with and (suspected) exposure to covid-19: Secondary | ICD-10-CM | POA: Diagnosis present

## 2020-09-29 DIAGNOSIS — G9340 Encephalopathy, unspecified: Secondary | ICD-10-CM | POA: Diagnosis not present

## 2020-09-29 DIAGNOSIS — I447 Left bundle-branch block, unspecified: Secondary | ICD-10-CM | POA: Diagnosis present

## 2020-09-29 DIAGNOSIS — G40909 Epilepsy, unspecified, not intractable, without status epilepticus: Secondary | ICD-10-CM

## 2020-09-29 DIAGNOSIS — I130 Hypertensive heart and chronic kidney disease with heart failure and stage 1 through stage 4 chronic kidney disease, or unspecified chronic kidney disease: Secondary | ICD-10-CM | POA: Diagnosis present

## 2020-09-29 DIAGNOSIS — I5042 Chronic combined systolic (congestive) and diastolic (congestive) heart failure: Secondary | ICD-10-CM | POA: Diagnosis present

## 2020-09-29 DIAGNOSIS — F17210 Nicotine dependence, cigarettes, uncomplicated: Secondary | ICD-10-CM | POA: Diagnosis present

## 2020-09-29 DIAGNOSIS — R609 Edema, unspecified: Secondary | ICD-10-CM | POA: Diagnosis not present

## 2020-09-29 DIAGNOSIS — T426X6A Underdosing of other antiepileptic and sedative-hypnotic drugs, initial encounter: Secondary | ICD-10-CM | POA: Diagnosis present

## 2020-09-29 DIAGNOSIS — N183 Chronic kidney disease, stage 3 unspecified: Secondary | ICD-10-CM | POA: Diagnosis present

## 2020-09-29 LAB — COMPREHENSIVE METABOLIC PANEL
ALT: 15 U/L (ref 0–44)
AST: 17 U/L (ref 15–41)
Albumin: 3.4 g/dL — ABNORMAL LOW (ref 3.5–5.0)
Alkaline Phosphatase: 62 U/L (ref 38–126)
Anion gap: 11 (ref 5–15)
BUN: 76 mg/dL — ABNORMAL HIGH (ref 8–23)
CO2: 21 mmol/L — ABNORMAL LOW (ref 22–32)
Calcium: 9 mg/dL (ref 8.9–10.3)
Chloride: 100 mmol/L (ref 98–111)
Creatinine, Ser: 3.1 mg/dL — ABNORMAL HIGH (ref 0.61–1.24)
GFR, Estimated: 21 mL/min — ABNORMAL LOW (ref >60)
Glucose, Bld: 118 mg/dL — ABNORMAL HIGH (ref 70–99)
Potassium: 4.3 mmol/L (ref 3.5–5.1)
Sodium: 132 mmol/L — ABNORMAL LOW (ref 135–145)
Total Bilirubin: 0.7 mg/dL (ref 0.3–1.2)
Total Protein: 7.3 g/dL (ref 6.5–8.1)

## 2020-09-29 LAB — CBC
HCT: 39.2 % (ref 39.0–52.0)
Hemoglobin: 12.6 g/dL — ABNORMAL LOW (ref 13.0–17.0)
MCH: 28.3 pg (ref 26.0–34.0)
MCHC: 32.1 g/dL (ref 30.0–36.0)
MCV: 88.1 fL (ref 80.0–100.0)
Platelets: 236 10*3/uL (ref 150–400)
RBC: 4.45 MIL/uL (ref 4.22–5.81)
RDW: 13.5 % (ref 11.5–15.5)
WBC: 7.9 10*3/uL (ref 4.0–10.5)
nRBC: 0 % (ref 0.0–0.2)

## 2020-09-29 LAB — GLUCOSE, CAPILLARY
Glucose-Capillary: 101 mg/dL — ABNORMAL HIGH (ref 70–99)
Glucose-Capillary: 146 mg/dL — ABNORMAL HIGH (ref 70–99)
Glucose-Capillary: 122 mg/dL — ABNORMAL HIGH (ref 70–99)
Glucose-Capillary: 128 mg/dL — ABNORMAL HIGH (ref 70–99)

## 2020-09-29 LAB — CBG MONITORING, ED: Glucose-Capillary: 140 mg/dL — ABNORMAL HIGH (ref 70–99)

## 2020-09-29 LAB — SARS CORONAVIRUS 2 (TAT 6-24 HRS): SARS Coronavirus 2: NEGATIVE

## 2020-09-29 MED ORDER — DIVALPROEX SODIUM 250 MG PO DR TAB
500.0000 mg | DELAYED_RELEASE_TABLET | Freq: Two times a day (BID) | ORAL | Status: DC
  Administered 2020-09-29 – 2020-10-01 (×4): 500 mg via ORAL
  Filled 2020-09-29 (×4): qty 2

## 2020-09-29 MED ORDER — LEVETIRACETAM 500 MG PO TABS
500.0000 mg | ORAL_TABLET | Freq: Two times a day (BID) | ORAL | Status: DC
  Administered 2020-09-29 – 2020-10-01 (×5): 500 mg via ORAL
  Filled 2020-09-29 (×5): qty 1

## 2020-09-29 MED ORDER — INSULIN ASPART 100 UNIT/ML ~~LOC~~ SOLN: 0.0000 [IU] | Freq: Three times a day (TID) | SUBCUTANEOUS | Status: DC

## 2020-09-29 MED ORDER — TAMSULOSIN HCL 0.4 MG PO CAPS
0.4000 mg | ORAL_CAPSULE | Freq: Every day | ORAL | Status: DC
  Administered 2020-09-29 – 2020-09-30 (×2): 0.4 mg via ORAL
  Filled 2020-09-29 (×2): qty 1

## 2020-09-29 MED ORDER — ISOSORB DINITRATE-HYDRALAZINE 20-37.5 MG PO TABS
1.0000 | ORAL_TABLET | Freq: Three times a day (TID) | ORAL | Status: DC
  Administered 2020-09-29 – 2020-10-01 (×7): 1 via ORAL
  Filled 2020-09-29 (×8): qty 1

## 2020-09-29 MED ORDER — SODIUM ZIRCONIUM CYCLOSILICATE 5 G PO PACK
5.0000 g | PACK | Freq: Once | ORAL | Status: AC
  Administered 2020-09-29: 5 g via ORAL
  Filled 2020-09-29: qty 1

## 2020-09-29 MED ORDER — SODIUM CHLORIDE 0.9 % IV SOLN
INTRAVENOUS | Status: DC | PRN
  Administered 2020-09-29: 250 mL via INTRAVENOUS

## 2020-09-29 MED ORDER — SODIUM CHLORIDE 0.9 % IV SOLN: INTRAVENOUS | Status: DC

## 2020-09-29 MED ORDER — ASPIRIN EC 81 MG PO TBEC
81.0000 mg | DELAYED_RELEASE_TABLET | Freq: Every day | ORAL | Status: DC
  Administered 2020-09-29 – 2020-10-01 (×3): 81 mg via ORAL
  Filled 2020-09-29 (×3): qty 1

## 2020-09-29 MED ORDER — CARVEDILOL 3.125 MG PO TABS
3.1250 mg | ORAL_TABLET | Freq: Two times a day (BID) | ORAL | Status: DC
  Administered 2020-09-29 – 2020-10-01 (×4): 3.125 mg via ORAL
  Filled 2020-09-29 (×4): qty 1

## 2020-09-29 NOTE — Progress Notes (Signed)
Contacted the MD on call about patient needing a PO medication when patient was NPO. MD on call gave new orders and changed patient's NPO status to heart healthy/carb modified diet.
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
## 2020-09-29 NOTE — Plan of Care (Signed)
  Problem: Education: Goal: Knowledge of General Education information will improve Description: Including pain rating scale, medication(s)/side effects and non-pharmacologic comfort measures Outcome: Progressing   Problem: Clinical Measurements: Goal: Respiratory complications will improve Outcome: Progressing   Problem: Nutrition: Goal: Adequate nutrition will be maintained Outcome: Progressing   Problem: Coping: Goal: Level of anxiety will decrease Outcome: Progressing   Problem: Elimination: Goal: Will not experience complications related to bowel motility Outcome: Progressing Goal: Will not experience complications related to urinary retention Outcome: Progressing   Problem: Pain Managment: Goal: General experience of comfort will improve Outcome: Progressing   Problem: Safety: Goal: Ability to remain free from injury will improve Outcome: Progressing   Problem: Skin Integrity: Goal: Risk for impaired skin integrity will decrease Outcome: Progressing   
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
## 2020-09-29 NOTE — TOC Initial Note (Signed)
Transition of Care [facility]) - Initial/Assessment Note    Patient Details  Name: Tim Bradley MRN: [id number] Date of Birth: November 21, 1950  [facility] CM/SW Contact:    Lennart Pall, LCSW Phone Number: 09/29/2020, 1:20 PM  Clinical Narrative:                 Referral received to assist with SNF placement.  Attempted to speak with pt, however, he was sleeping very soundly and I was unable to awaken.  MD had spoken with pt's sister prior and I followed up with her as well to gather info.  Sister, Tim Bradley [phone]) reports that pt has not been managing well at his home and has only limited assist from the woman living in the home with him.  Sister notes that he was at a SNF in August of this year and believes this needs to be the plan again.  MD has ordered PT/OT evals.  Will ask oncoming TOC members to attempt to speak with pt and make sure he is in agreement with SNF plan.  Will need to complete FL2 once therapies have seen patient.    Expected Discharge Plan: Skilled Nursing Facility Barriers to Discharge: Continued Medical Work up   Patient Goals and CMS Choice        Expected Discharge Plan and Services Expected Discharge Plan: [place] In-house Referral: Clinical Social Work     Living arrangements for the past 2 months: Single Family Home                                      Prior Living Arrangements/Services Living arrangements for the past 2 months: Single Family Home Lives with:: Significant Other Patient language and need for interpreter reviewed:: Yes Do you feel safe going back to the place where you live?: No   per sister - no adequate caregiver at the home  Need for Family Participation in Patient Care: Yes (Comment) Care giver support system in place?: No (comment)   Criminal Activity/Legal Involvement Pertinent to Current Situation/Hospitalization: No - Comment as needed  Activities of Daily Living Home Assistive  Devices/Equipment: Wheelchair,Dentures (specify type) ADL Screening (condition at time of admission) Patient's cognitive ability adequate to safely complete daily activities?: No Is the patient deaf or have difficulty hearing?: Yes Does the patient have difficulty seeing, even when wearing glasses/contacts?: No Does the patient have difficulty concentrating, remembering, or making decisions?: No Patient able to express need for assistance with ADLs?: Yes Does the patient have difficulty dressing or bathing?: Yes Independently performs ADLs?: No Communication: Independent Dressing (OT): Needs assistance Is this a change from baseline?: Pre-admission baseline Grooming: Needs assistance Is this a change from baseline?: Pre-admission baseline Feeding: Independent Bathing: Needs assistance Is this a change from baseline?: Pre-admission baseline Toileting: Needs assistance Is this a change from baseline?: Pre-admission baseline In/Out Bed: Needs assistance Is this a change from baseline?: Pre-admission baseline Walks in Home: Needs assistance Is this a change from baseline?: Pre-admission baseline Does the patient have difficulty walking or climbing stairs?: Yes Weakness of Legs: Both Weakness of Arms/Hands: Both (Just weakness in hands)  Permission Sought/Granted Permission sought to share information with : Facility Sport and exercise psychologist                Emotional Assessment Appearance:: Appears stated age Attitude/Demeanor/Rapport: Unable to Assess,Lethargic Affect (typically observed): Unable to Assess   Alcohol /  Substance Use: Alcohol Use Psych Involvement: No (comment)  Admission diagnosis:  Hyperkalemia [E87.5] Seizure ([place]) [R56.9] Acute encephalopathy [G93.40] AKI (acute kidney injury) ([place]) [N17.9] Patient Active Problem List   Diagnosis Date Noted  . Cardiomyopathy ([place]) 09/28/2020  . AKI (acute kidney injury) ([place]) 09/28/2020  . Pressure injury of skin  05/03/2020  . Acute encephalopathy 05/02/2020  . Diarrhea 05/02/2020  . QT prolongation 05/02/2020  . Acute combined systolic and diastolic congestive heart failure ([place]) 04/22/2020  . Essential hypertension 04/11/2020  . Leg edema 04/11/2020  . Dyspnea 04/11/2020  . Acute on chronic congestive heart failure ([place]) 04/11/2020  . BPH (benign prostatic hyperplasia) 04/11/2020  . 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 Generalized weakness 04/11/2020  . Seizure ([place]) 07/21/2019  . Cocaine abuse ([place])   . Constipation 08/22/2018  . Gastritis 08/20/2018  . Heme positive stool   . Acute blood loss anemia secondary to massive gastric ulcer   . Hyponatremia   . Abscess, gluteal, left 03/26/2017  . Type 2 diabetes mellitus ([place]) 03/26/2017  . Adrenal nodule ([place]) 03/26/2017  . Closed fracture of body of thoracic vertebra ([place]) 03/26/2017  . Spinal stenosis of lumbar region 03/26/2017   PCP:  [place]:   [id number] [place], [place] [address] [place] [address] Phone: [phone] Fax: [phone]  [place], [place] - [address] [place]  [address] Phone: [phone] Fax: [place], [place] [address] [place] [place] [address] Phone: [phone] Fax: [phone]     Social Determinants of Health (SDOH) Interventions    Readmission Risk Interventions Readmission Risk Prevention Plan 07/24/2019  Transportation Screening Complete  PCP or Specialist Appt within 3-5 Days Complete  HRI or [place] Complete  Social Work Consult for [place] Planning/Counseling Complete  Palliative Care Screening Not Applicable  Medication Review Press photographer) Complete  Some recent data might be hidden
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
## 2020-09-30 ENCOUNTER — Inpatient Hospital Stay (HOSPITAL_COMMUNITY): Payer: Medicare (Managed Care)

## 2020-09-30 DIAGNOSIS — I82401 Acute embolism and thrombosis of unspecified deep veins of right lower extremity: Secondary | ICD-10-CM | POA: Diagnosis present

## 2020-09-30 DIAGNOSIS — G9340 Encephalopathy, unspecified: Secondary | ICD-10-CM | POA: Diagnosis not present

## 2020-09-30 DIAGNOSIS — R609 Edema, unspecified: Secondary | ICD-10-CM

## 2020-09-30 LAB — CBC WITH DIFFERENTIAL/PLATELET
Abs Immature Granulocytes: 0.03 10*3/uL (ref 0.00–0.07)
Basophils Absolute: 0 10*3/uL (ref 0.0–0.1)
Basophils Relative: 1 %
Eosinophils Absolute: 0.2 10*3/uL (ref 0.0–0.5)
Eosinophils Relative: 3 %
HCT: 35.1 % — ABNORMAL LOW (ref 39.0–52.0)
Hemoglobin: 11.2 g/dL — ABNORMAL LOW (ref 13.0–17.0)
Immature Granulocytes: 1 %
Lymphocytes Relative: 20 %
Lymphs Abs: 1.2 10*3/uL (ref 0.7–4.0)
MCH: 27.7 pg (ref 26.0–34.0)
MCHC: 31.9 g/dL (ref 30.0–36.0)
MCV: 86.9 fL (ref 80.0–100.0)
Monocytes Absolute: 0.6 10*3/uL (ref 0.1–1.0)
Monocytes Relative: 10 %
Neutro Abs: 4.1 10*3/uL (ref 1.7–7.7)
Neutrophils Relative %: 65 %
Platelets: 232 10*3/uL (ref 150–400)
RBC: 4.04 MIL/uL — ABNORMAL LOW (ref 4.22–5.81)
RDW: 13.5 % (ref 11.5–15.5)
WBC: 6.2 10*3/uL (ref 4.0–10.5)
nRBC: 0 % (ref 0.0–0.2)

## 2020-09-30 LAB — MAGNESIUM: Magnesium: 2.6 mg/dL — ABNORMAL HIGH (ref 1.7–2.4)

## 2020-09-30 LAB — URINE CULTURE: Culture: NO GROWTH

## 2020-09-30 LAB — GLUCOSE, CAPILLARY
Glucose-Capillary: 108 mg/dL — ABNORMAL HIGH (ref 70–99)
Glucose-Capillary: 145 mg/dL — ABNORMAL HIGH (ref 70–99)
Glucose-Capillary: 119 mg/dL — ABNORMAL HIGH (ref 70–99)
Glucose-Capillary: 143 mg/dL — ABNORMAL HIGH (ref 70–99)

## 2020-09-30 LAB — BASIC METABOLIC PANEL
Anion gap: 12 (ref 5–15)
BUN: 79 mg/dL — ABNORMAL HIGH (ref 8–23)
CO2: 20 mmol/L — ABNORMAL LOW (ref 22–32)
Calcium: 8.4 mg/dL — ABNORMAL LOW (ref 8.9–10.3)
Chloride: 103 mmol/L (ref 98–111)
Creatinine, Ser: 2.97 mg/dL — ABNORMAL HIGH (ref 0.61–1.24)
GFR, Estimated: 22 mL/min — ABNORMAL LOW (ref >60)
Glucose, Bld: 112 mg/dL — ABNORMAL HIGH (ref 70–99)
Potassium: 3.7 mmol/L (ref 3.5–5.1)
Sodium: 135 mmol/L (ref 135–145)

## 2020-09-30 LAB — PHOSPHORUS: Phosphorus: 4.5 mg/dL (ref 2.5–4.6)

## 2020-09-30 MED ORDER — APIXABAN 5 MG PO TABS
10.0000 mg | ORAL_TABLET | Freq: Two times a day (BID) | ORAL | Status: DC
  Administered 2020-09-30 – 2020-10-01 (×3): 10 mg via ORAL
  Filled 2020-09-30 (×3): qty 2

## 2020-09-30 MED ORDER — APIXABAN 5 MG PO TABS: 5.0000 mg | ORAL_TABLET | Freq: Two times a day (BID) | ORAL | Status: DC
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
## 2020-09-30 NOTE — Plan of Care (Signed)
  Problem: Education: Goal: Knowledge of General Education information will improve Description: Including pain rating scale, medication(s)/side effects and non-pharmacologic comfort measures Outcome: Progressing   Problem: Clinical Measurements: Goal: Respiratory complications will improve Outcome: Progressing Goal: Cardiovascular complication will be avoided Outcome: Progressing   Problem: Nutrition: Goal: Adequate nutrition will be maintained Outcome: Progressing   Problem: Coping: Goal: Level of anxiety will decrease Outcome: Progressing   Problem: Elimination: Goal: Will not experience complications related to bowel motility Outcome: Progressing Goal: Will not experience complications related to urinary retention Outcome: Progressing   Problem: Pain Managment: Goal: General experience of comfort will improve Outcome: Progressing   Problem: Safety: Goal: Ability to remain free from injury will improve Outcome: Progressing   Problem: Skin Integrity: Goal: Risk for impaired skin integrity will decrease Outcome: Progressing

## 2020-09-30 NOTE — Progress Notes (Signed)
Right lower extremity venous study completed.   Results given to [facility].   Please see CV Proc for preliminary results.   Vonzell Schlatter, RVT

## 2020-09-30 NOTE — Progress Notes (Signed)
ANTICOAGULATION CONSULT NOTE - Initial Consult  Pharmacy Consult for apixaban Indication: DVTs  No Known Allergies  Patient Measurements: Height: 6\' 2"  (188 cm) Weight: 92.9 kg (204 lb 12.9 oz) IBW/kg (Calculated) : 82.2  Vital Signs: Temp: 98.3 F (36.8 C) (01/02 1301) Temp Source: Oral (01/02 1301) BP: 122/70 (01/02 1301) Pulse Rate: 73 (01/02 1301)  Labs: Recent Labs    09/28/20 2327 09/29/20 0441 09/30/20 0539  HGB 12.7* 12.6* 11.2*  HCT 39.2 39.2 35.1*  PLT 237 236 232  CREATININE 3.23* 3.10* 2.97*    Estimated Creatinine Clearance: 26.5 mL/min (A) (by C-G formula based on SCr of 2.97 mg/dL (H)).   Medical History: Past Medical History:  Diagnosis Date  . Abscess 02/2017   LEFT GLUTEAL   . Acid reflux   . Alcohol abuse   . Anemia   . CHF (congestive heart failure) ([place])   . 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 Cocaine abuse ([place])   . DDD (degenerative disc disease), lumbar   . Diabetes mellitus without complication ([place])   . Dyspnea   . Enlarged prostate   . Hypertension   . Seizures ([place])   . Spinal stenosis, lumbar     Medications:  Scheduled:  . aspirin EC  81 mg Oral Daily  . carvedilol  3.125 mg Oral BID WC  . divalproex  500 mg Oral Q12H  . insulin aspart  0-6 Units Subcutaneous TID WC  . isosorbide-hydrALAZINE  1 tablet Oral TID  . levETIRAcetam  500 mg Oral BID  . tamsulosin  0.4 mg Oral QPC supper   Infusions:  . sodium chloride Stopped (09/29/20 1149)    Assessment: 70 yo male with new multiple DVTs in right leg to start on apixaban per Md orders. Note that patient with SCr > 2.5 and est CrCl of 26.5 ml/min. And also note that clinical trials for apixaban excluded patients with either SCr > 2.5 or CrCl < 25 ml/min. If SCr, does not improve at all, would suggest switching to alternative agent.   Goal of Therapy:  Resolution of current DVTs, prevention of new ones   Plan:  Apixaban 10mg  PO BID  x 7 days then 5mg  BID thereafter Daily CBC and SCr Will educate patient if able prior to discharge  Kara Mead 09/30/2020,1:23 PM

## 2020-10-01 DIAGNOSIS — G9340 Encephalopathy, unspecified: Secondary | ICD-10-CM | POA: Diagnosis not present

## 2020-10-01 LAB — CBC
HCT: 33.4 % — ABNORMAL LOW (ref 39.0–52.0)
Hemoglobin: 10.8 g/dL — ABNORMAL LOW (ref 13.0–17.0)
MCH: 28.1 pg (ref 26.0–34.0)
MCHC: 32.3 g/dL (ref 30.0–36.0)
MCV: 86.8 fL (ref 80.0–100.0)
Platelets: 233 K/uL (ref 150–400)
RBC: 3.85 MIL/uL — ABNORMAL LOW (ref 4.22–5.81)
RDW: 13.4 % (ref 11.5–15.5)
WBC: 5.9 K/uL (ref 4.0–10.5)
nRBC: 0 % (ref 0.0–0.2)

## 2020-10-01 LAB — CREATININE, SERUM
Creatinine, Ser: 2.77 mg/dL — ABNORMAL HIGH (ref 0.61–1.24)
GFR, Estimated: 24 mL/min — ABNORMAL LOW

## 2020-10-01 LAB — GLUCOSE, CAPILLARY
Glucose-Capillary: 94 mg/dL (ref 70–99)
Glucose-Capillary: 118 mg/dL — ABNORMAL HIGH (ref 70–99)

## 2020-10-01 MED ORDER — APIXABAN 5 MG PO TABS: 5.0000 mg | ORAL_TABLET | Freq: Two times a day (BID) | ORAL | 3 refills | Status: DC

## 2020-10-01 MED ORDER — APIXABAN 5 MG PO TABS: 10.0000 mg | ORAL_TABLET | Freq: Two times a day (BID) | ORAL | 0 refills | Status: DC

## 2020-10-01 NOTE — Discharge Instructions (Signed)
Information on my medicine - ELIQUIS (apixaban)  Why was Eliquis prescribed for you? Eliquis was prescribed to treat blood clots that may have been found in the veins of your legs (deep vein thrombosis) or in your lungs (pulmonary embolism) and to reduce the risk of them occurring again.  What do You need to know about Eliquis ? The starting dose is 10 mg (two 5 mg tablets) taken TWICE daily for the FIRST SEVEN (7) DAYS, then on (enter date)  January 9th  the dose is reduced to ONE 5 mg tablet taken TWICE daily.  Eliquis may be taken with or without food.   Try to take the dose about the same time in the morning and in the evening. If you have difficulty swallowing the tablet whole please discuss with your pharmacist how to take the medication safely.  Take Eliquis exactly as prescribed and DO NOT stop taking Eliquis without talking to the doctor who prescribed the medication.  Stopping may increase your risk of developing a new blood clot.  Refill your prescription before you run out.  After discharge, you should have regular check-up appointments with your healthcare provider that is prescribing your Eliquis.    What do you do if you miss a dose? If a dose of ELIQUIS is not taken at the scheduled time, take it as soon as possible on the same day and twice-daily administration should be resumed. The dose should not be doubled to make up for a missed dose.  Important Safety Information A possible side effect of Eliquis is bleeding. You should call your healthcare provider right away if you experience any of the following: ? Bleeding from an injury or your nose that does not stop. ? Unusual colored urine (red or dark brown) or unusual colored stools (red or black). ? Unusual bruising for unknown reasons. ? A serious fall or if you hit your head (even if there is no bleeding).  Some medicines may interact with Eliquis and might increase your risk of bleeding or clotting while on  Eliquis. To help avoid this, consult your healthcare provider or pharmacist prior to using any new prescription or non-prescription medications, including herbals, vitamins, non-steroidal anti-inflammatory drugs (NSAIDs) and supplements.  This website has more information on Eliquis (apixaban): http://www.eliquis.com/eliquis/home
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
## 2020-10-01 NOTE — Plan of Care (Signed)
  Problem: Education: Goal: Knowledge of General Education information will improve Description: Including pain rating scale, medication(s)/side effects and non-pharmacologic comfort measures Outcome: Progressing   Problem: Clinical Measurements: Goal: Will remain free from infection Outcome: Progressing   Problem: Activity: Goal: Risk for activity intolerance will decrease Outcome: Progressing   Problem: Safety: Goal: Ability to remain free from injury will improve Outcome: Progressing   Problem: Nutrition: Goal: Adequate nutrition will be maintained Outcome: Adequate for Discharge   Problem: Coping: Goal: Level of anxiety will decrease Outcome: Adequate for Discharge

## 2020-10-01 NOTE — Progress Notes (Signed)
ANTICOAGULATION CONSULT NOTE - Initial Consult  Pharmacy Consult for apixaban Indication: DVTs  No Known Allergies  Estimated Creatinine Clearance: 28.4 mL/min (A) (by C-G formula based on SCr of 2.77 mg/dL (H)).  Assessment: 70 yo male with new multiple DVTs in right leg to start on apixaban per Md orders. Note that patient with SCr > 2.5 and est CrCl of 31 ml/min using total body weight.   Goal of Therapy:  Resolution of current DVTs, prevention of new ones   Plan:  Continue with Apixaban 10mg  PO BID x 7 days then 5mg  BID starting Jan 9th Provided patient with 30-day coupon voucher and educated patient on [place] therapy  Peggyann Juba, PharmD, [place]: [phone] 10/01/2020,12:22 PM

## 2020-10-01 NOTE — TOC Transition Note (Addendum)
Transition of Care [facility]) - CM/SW Discharge Note   Patient Details  Name: Tim Bradley MRN: [id number] Date of Birth: December 08, 1950  [facility] CM/SW Contact:  Dessa Phi, RN Phone Number: 10/01/2020, 12:56 PM   Clinical Narrative: Received call from sister-[name] with concerns about d/c. When asked patient if ok to talk-he says i'm talking to her now & he can answer any of my concerns. So I politely explained that patient is A+0x3-can make own decisions.Paitent states he has a girlfriend @ home-he needs to get home as soon as possibel but his is not afraid to go home. Had no preference for [facility] since he declines SNF. PTAR for transport-confirmed address. APS referral placed for home environment check.No further CM needs.   2:15p-PTAR called-Nsg aware. No further CM needs.  Final next level of care: [place] Barriers to Discharge: No Barriers Identified   Patient Goals and CMS Choice Patient states their goals for this hospitalization and ongoing recovery are:: go home CMS Medicare.gov Compare Post Acute Care list provided to:: Patient Choice offered to / list presented to : Patient  Discharge Placement                       Discharge Plan and Services In-house Referral: Clinical Social Work Discharge Planning Services: CM Consult Post Acute Care Choice: Home Health                    HH Arranged: RN,PT,OT,Nurse[place] Work [facility] Agency: Microbiologist ([place]) Date [place]: 10/01/20 Time [place]: 1255 Representative spoke with at [place]: [place] ([place]) Interventions     Readmission Risk Interventions Readmission Risk Prevention Plan 10/01/2020 07/24/2019  Transportation Screening Complete Complete  PCP or Specialist Appt within 3-5 Days - Complete  HRI or [place] - Complete  Social Work Consult for [place] Planning/Counseling - Complete   Palliative Care Screening - Not Applicable  Medication Review Press photographer) Complete Complete  PCP or Specialist appointment within 3-5 days of discharge Complete -  [place] or [place] Complete -  SW Recovery Care/Counseling Consult Complete -  Palliative Care Screening Complete -  [place] Patient Refused -  Some recent data might be hidden
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
## 2020-10-01 NOTE — Plan of Care (Signed)
  Problem: Education: Goal: Knowledge of General Education information will improve Description: Including pain rating scale, medication(s)/side effects and non-pharmacologic comfort measures 10/01/2020 1424 by Lennie Hummer, RN Outcome: Adequate for Discharge 10/01/2020 1407 by Lennie Hummer, RN Outcome: Progressing

## 2020-10-22 ENCOUNTER — Other Ambulatory Visit: Payer: Self-pay

## 2020-10-22 ENCOUNTER — Emergency Department (HOSPITAL_COMMUNITY): Payer: Medicare (Managed Care)

## 2020-10-22 ENCOUNTER — Emergency Department (HOSPITAL_COMMUNITY)
Admission: EM | Admit: 2020-10-22 | Discharge: 2020-10-23 | Disposition: A | Discharge status: Home / Self Care | Payer: Medicare (Managed Care) | Attending: Emergency Medicine | Admitting: Emergency Medicine

## 2020-10-22 DIAGNOSIS — E1122 Type 2 diabetes mellitus with diabetic chronic kidney disease: Secondary | ICD-10-CM | POA: Insufficient documentation

## 2020-10-22 DIAGNOSIS — R4182 Altered mental status, unspecified: Secondary | ICD-10-CM | POA: Insufficient documentation

## 2020-10-22 DIAGNOSIS — N184 Chronic kidney disease, stage 4 (severe): Secondary | ICD-10-CM | POA: Insufficient documentation

## 2020-10-22 DIAGNOSIS — G40909 Epilepsy, unspecified, not intractable, without status epilepticus: Secondary | ICD-10-CM

## 2020-10-22 DIAGNOSIS — Z79899 Other long term (current) drug therapy: Secondary | ICD-10-CM | POA: Diagnosis not present

## 2020-10-22 DIAGNOSIS — R600 Localized edema: Secondary | ICD-10-CM | POA: Diagnosis not present

## 2020-10-22 DIAGNOSIS — R569 Unspecified convulsions: Secondary | ICD-10-CM | POA: Insufficient documentation

## 2020-10-22 DIAGNOSIS — I130 Hypertensive heart and chronic kidney disease with heart failure and stage 1 through stage 4 chronic kidney disease, or unspecified chronic kidney disease: Secondary | ICD-10-CM | POA: Diagnosis not present

## 2020-10-22 DIAGNOSIS — I5041 Acute combined systolic (congestive) and diastolic (congestive) heart failure: Secondary | ICD-10-CM | POA: Insufficient documentation

## 2020-10-22 DIAGNOSIS — Z7901 Long term (current) use of anticoagulants: Secondary | ICD-10-CM | POA: Insufficient documentation

## 2020-10-22 DIAGNOSIS — F17210 Nicotine dependence, cigarettes, uncomplicated: Secondary | ICD-10-CM | POA: Insufficient documentation

## 2020-10-22 DIAGNOSIS — Z7982 Long term (current) use of aspirin: Secondary | ICD-10-CM | POA: Insufficient documentation

## 2020-10-22 LAB — COMPREHENSIVE METABOLIC PANEL
ALT: 12 U/L (ref 0–44)
AST: 19 U/L (ref 15–41)
Albumin: 2.9 g/dL — ABNORMAL LOW (ref 3.5–5.0)
Alkaline Phosphatase: 63 U/L (ref 38–126)
Anion gap: 7 (ref 5–15)
BUN: 55 mg/dL — ABNORMAL HIGH (ref 8–23)
CO2: 24 mmol/L (ref 22–32)
Calcium: 9.1 mg/dL (ref 8.9–10.3)
Chloride: 103 mmol/L (ref 98–111)
Creatinine, Ser: 2.83 mg/dL — ABNORMAL HIGH (ref 0.61–1.24)
GFR, Estimated: 23 mL/min — ABNORMAL LOW (ref >60)
Glucose, Bld: 104 mg/dL — ABNORMAL HIGH (ref 70–99)
Potassium: 4.4 mmol/L (ref 3.5–5.1)
Sodium: 134 mmol/L — ABNORMAL LOW (ref 135–145)
Total Bilirubin: 0.3 mg/dL (ref 0.3–1.2)
Total Protein: 6.9 g/dL (ref 6.5–8.1)

## 2020-10-22 LAB — CBC WITH DIFFERENTIAL/PLATELET
Abs Immature Granulocytes: 0.02 10*3/uL (ref 0.00–0.07)
Basophils Absolute: 0 10*3/uL (ref 0.0–0.1)
Basophils Relative: 1 %
Eosinophils Absolute: 0.1 10*3/uL (ref 0.0–0.5)
Eosinophils Relative: 2 %
HCT: 39.8 % (ref 39.0–52.0)
Hemoglobin: 12.3 g/dL — ABNORMAL LOW (ref 13.0–17.0)
Immature Granulocytes: 0 %
Lymphocytes Relative: 14 %
Lymphs Abs: 0.8 10*3/uL (ref 0.7–4.0)
MCH: 27.6 pg (ref 26.0–34.0)
MCHC: 30.9 g/dL (ref 30.0–36.0)
MCV: 89.4 fL (ref 80.0–100.0)
Monocytes Absolute: 0.7 10*3/uL (ref 0.1–1.0)
Monocytes Relative: 13 %
Neutro Abs: 3.9 10*3/uL (ref 1.7–7.7)
Neutrophils Relative %: 70 %
Platelets: 340 10*3/uL (ref 150–400)
RBC: 4.45 MIL/uL (ref 4.22–5.81)
RDW: 13.3 % (ref 11.5–15.5)
WBC: 5.5 10*3/uL (ref 4.0–10.5)
nRBC: 0 % (ref 0.0–0.2)

## 2020-10-22 LAB — URINALYSIS, ROUTINE W REFLEX MICROSCOPIC
Bilirubin Urine: NEGATIVE
Glucose, UA: NEGATIVE mg/dL
Ketones, ur: NEGATIVE mg/dL
Nitrite: NEGATIVE
Protein, ur: 100 mg/dL — AB
Specific Gravity, Urine: 1.013 (ref 1.005–1.030)
WBC, UA: >50 WBC/hpf — ABNORMAL HIGH (ref 0–5)
pH: 5 (ref 5.0–8.0)

## 2020-10-22 LAB — AMMONIA: Ammonia: 25 umol/L (ref 9–35)

## 2020-10-22 LAB — RAPID URINE DRUG SCREEN, HOSP PERFORMED
Amphetamines: NOT DETECTED
Barbiturates: NOT DETECTED
Benzodiazepines: NOT DETECTED
Cocaine: POSITIVE — AB
Opiates: NOT DETECTED
Tetrahydrocannabinol: NOT DETECTED

## 2020-10-22 LAB — PROTIME-INR
INR: 1.6 — ABNORMAL HIGH (ref 0.8–1.2)
Prothrombin Time: 18.3 seconds — ABNORMAL HIGH (ref 11.4–15.2)

## 2020-10-22 LAB — TROPONIN I (HIGH SENSITIVITY)
Troponin I (High Sensitivity): 16 ng/L (ref <18)
Troponin I (High Sensitivity): 16 ng/L (ref <18)

## 2020-10-22 LAB — ETHANOL: Alcohol, Ethyl (B): <10 mg/dL (ref <10)

## 2020-10-22 LAB — CBG MONITORING, ED: Glucose-Capillary: 124 mg/dL — ABNORMAL HIGH (ref 70–99)

## 2020-10-22 LAB — VALPROIC ACID LEVEL: Valproic Acid Lvl: 29 ug/mL — ABNORMAL LOW (ref 50.0–100.0)

## 2020-10-22 MED ORDER — CEPHALEXIN 500 MG PO CAPS: ORAL_CAPSULE | ORAL | 0 refills | Status: DC

## 2020-10-22 MED ORDER — DIVALPROEX SODIUM 250 MG PO DR TAB
500.0000 mg | DELAYED_RELEASE_TABLET | Freq: Once | ORAL | Status: AC
  Administered 2020-10-22: 500 mg via ORAL
  Filled 2020-10-22: qty 2

## 2020-10-22 MED ORDER — LEVETIRACETAM IN NACL 1000 MG/100ML IV SOLN
1000.0000 mg | Freq: Once | INTRAVENOUS | Status: AC
  Administered 2020-10-22: 1000 mg via INTRAVENOUS
  Filled 2020-10-22: qty 100

## 2020-10-22 MED ORDER — SODIUM CHLORIDE 0.9 % IV SOLN
1.0000 g | Freq: Once | INTRAVENOUS | Status: AC
  Administered 2020-10-22: 1 g via INTRAVENOUS
  Filled 2020-10-22: qty 10

## 2020-10-22 NOTE — ED Notes (Signed)
Called [facility] 11st on list

## 2020-10-22 NOTE — ED Triage Notes (Signed)
BIB EMS for witnessed seizure that lasted less than a minute. Hx of same. Pt arrived post ictal, arousable with sternal rub. Last seizure was "a week ago".

## 2020-10-22 NOTE — ED Provider Notes (Signed)
Patient with a seizure.  He was low on his Depakote so he was given some more Depakote.  Patient has not been taking his medicines up till couple days ago.  Patient also using cocaine and had a urinary tract infection will be treated for that   Milton Ferguson, MD 10/22/20 2020

## 2020-10-22 NOTE — Discharge Instructions (Addendum)
Follow-up with your family doctor in a week.  Stop using cocaine and make sure you take your seizure medicine

## 2020-10-22 NOTE — ED Notes (Signed)
Pt sister aware that pt is being discharged home. Pt is wheelchair bound and will be transported back via [facility]

## 2020-10-22 NOTE — ED Provider Notes (Signed)
[facility] EMERGENCY DEPARTMENT Provider Note   CSN: [id number] Arrival date & time: 10/22/20  1510     History Chief Complaint  Patient presents with  . Seizures    Tim Bradley is a 70 y.o. male.  HPI Very limited history at this time.  Patient had a witnessed seizure lasting less than a minute.  Afterwards, patient has been very drowsy and not able to arouse.  Patient is currently not answering any questions.  Last seizure was reportedly a week ago.  At this time, no information on patient's compliance with medications or other associated injury.    Past Medical History:  Diagnosis Date  . Abscess 02/2017   LEFT GLUTEAL   . Acid reflux   . Alcohol abuse   . Anemia   . CHF (congestive heart failure) ([place])   . 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 Cocaine abuse ([place])   . DDD (degenerative disc disease), lumbar   . Diabetes mellitus without complication ([place])   . Dyspnea   . Enlarged prostate   . Hypertension   . Seizures ([place])   . Spinal stenosis, lumbar     Patient Active Problem List   Diagnosis Date Noted  . Right leg DVT ([place]) 09/30/2020  . Seizure disorder ([place]) 09/29/2020  . Cardiomyopathy ([place]) 09/28/2020  . AKI (acute kidney injury) ([place]) 09/28/2020  . Pressure injury of skin 05/03/2020  . Acute encephalopathy 05/02/2020  . Diarrhea 05/02/2020  . QT prolongation 05/02/2020  . Acute combined systolic and diastolic congestive heart failure ([place]) 04/22/2020  . Essential hypertension 04/11/2020  . Leg edema 04/11/2020  . Dyspnea 04/11/2020  . Acute on chronic congestive heart failure ([place]) 04/11/2020  . BPH (benign prostatic hyperplasia) 04/11/2020  . 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 Generalized weakness 04/11/2020  . Seizure ([place]) 07/21/2019  . Cocaine abuse ([place])   . Constipation 08/22/2018  . Gastritis 08/20/2018  . Heme positive stool   . Acute blood  loss anemia secondary to massive gastric ulcer   . Hyponatremia   . Abscess, gluteal, left 03/26/2017  . Type 2 diabetes mellitus ([place]) 03/26/2017  . Adrenal nodule ([place]) 03/26/2017  . Closed fracture of body of thoracic vertebra ([place]) 03/26/2017  . Spinal stenosis of lumbar region 03/26/2017    Past Surgical History:  Procedure Laterality Date  . BACK SURGERY     cervical x2  . BIOPSY  08/21/2018   Procedure: BIOPSY;  Surgeon: Ronald Lobo, MD;  Location: [facility];  Service: Endoscopy;;  . ESOPHAGOGASTRODUODENOSCOPY (EGD) WITH PROPOFOL N/A 08/21/2018   Procedure: ESOPHAGOGASTRODUODENOSCOPY (EGD) WITH PROPOFOL;  Surgeon: Ronald Lobo, MD;  Location: [facility];  Service: Endoscopy;  Laterality: N/A;  . INCISION AND DRAINAGE PERIRECTAL ABSCESS N/A 03/26/2017   Procedure: IRRIGATION AND DEBRIDEMENT PERIRECTAL ABSCESS;  Surgeon: Rolm Bookbinder, MD;  Location: [facility];  Service: General;  Laterality: N/A;       Family History  Problem Relation Age of Onset  . Hypertension Mother   . Cancer - Lung Father     Social History   Tobacco Use  . Smoking status: Current Every Day Smoker    Packs/day: 0.25    Types: Cigarettes  . Smokeless tobacco: Never Used  Vaping Use  . Vaping Use: Never used  Substance Use Topics  . Alcohol use: Yes    Alcohol/week: 12.0 standard drinks    Types: 12 Cans of  beer per week    Comment: i can of beer daily  . Drug use: Yes    Types: Cocaine, Marijuana    Comment: 03/2020     Home Medications Prior to Admission medications   Medication Sig Start Date End Date Taking? Authorizing Provider  acetaminophen (TYLENOL) 325 MG tablet Take 2 tablets (650 mg total) by mouth every 4 (four) hours as needed for headache or mild pain. 05/09/20   Terrilee Croak, MD  apixaban (ELIQUIS) 5 MG TABS tablet Take 2 tablets (10 mg total) by mouth 2 (two) times daily for 14 days. 10/01/20 10/15/20  Nita Sells, MD  apixaban (ELIQUIS) 5 MG TABS tablet  Take 1 tablet (5 mg total) by mouth 2 (two) times daily. 10/07/20   Nita Sells, MD  aspirin 81 MG EC tablet Take 1 tablet (81 mg total) by mouth daily. Swallow whole. Patient not taking: No sig reported 07/17/20   Davonna Belling, MD  carvedilol (COREG) 3.125 MG tablet Take 1 tablet (3.125 mg total) by mouth 2 (two) times daily with a meal. 04/13/20   Sheikh, Georgina Quint Latif, DO  divalproex (DEPAKOTE) 500 MG DR tablet Take 1 tablet (500 mg total) by mouth every 12 (twelve) hours. 07/17/20 09/15/20  Davonna Belling, MD  folic acid (FOLVITE) 1 MG tablet Take 1 tablet (1 mg total) by mouth daily. Patient not taking: Reported on 09/29/2020 05/10/20   Terrilee Croak, MD  furosemide (LASIX) 40 MG tablet Take 1 tablet (40 mg total) by mouth daily as needed for fluid or edema. Patient not taking: No sig reported 05/09/20   Terrilee Croak, MD  isosorbide-hydrALAZINE (BIDIL) 20-37.5 MG tablet Take 1 tablet by mouth 3 (three) times daily. 07/17/20   Davonna Belling, MD  levETIRAcetam (KEPPRA) 500 MG tablet Take 1 tablet (500 mg total) by mouth 2 (two) times daily. Patient taking differently: Take 500 mg by mouth daily. 07/17/20   Davonna Belling, MD  Multiple Vitamin (MULTIVITAMIN WITH MINERALS) TABS tablet Take 1 tablet by mouth daily. 05/10/20   Terrilee Croak, MD  tamsulosin (FLOMAX) 0.4 MG CAPS capsule Take 1 capsule (0.4 mg total) by mouth daily after supper. 01/27/20   Deno Etienne, DO  thiamine 100 MG tablet Take 1 tablet (100 mg total) by mouth daily. Patient not taking: No sig reported 05/10/20   Terrilee Croak, MD    Allergies    Patient has no known allergies.  Review of Systems   Review of Systems Level 5 caveat cannot obtain review of systems patient is not responsive Physical Exam Updated Vital Signs BP (!) 145/93 (BP Location: Right Arm)   Pulse 82   Resp 18   SpO2 100%   Physical Exam Constitutional:      Comments: Patient is sleeping.  He does not have labored respirations.  Does  not rouse to answer questions.  HENT:     Head: Normocephalic and atraumatic.     Mouth/Throat:     Pharynx: Oropharynx is clear.     Comments: Airway is clear.  No sonorous respirations or gurgling. Eyes:     Comments: Patient does close his eyes to resist opening.  Pupils are 2 mm and symmetric.  Cardiovascular:     Rate and Rhythm: Normal rate and regular rhythm.  Pulmonary:     Comments: Respirations nonlabored.  Lungs grossly clear Abdominal:     General: There is no distension.     Palpations: Abdomen is soft.     Tenderness: There is no abdominal tenderness.  There is no guarding.  Musculoskeletal:     Comments: 2+ edema bilateral lower extremities.  No obvious deformities  Skin:    General: Skin is warm and dry.  Neurological:     Comments: Patient is not responding to stimulus.  He will not follow any commands.  He does forcefully resist eye opening.     ED Results / Procedures / Treatments   Labs (all labs ordered are listed, but only abnormal results are displayed) Labs Reviewed  CBG MONITORING, ED - Abnormal; Notable for the following components:      Result Value   Glucose-Capillary 124 (*)    All other components within normal limits  COMPREHENSIVE METABOLIC PANEL  ETHANOL  CBC WITH DIFFERENTIAL/PLATELET  URINALYSIS, ROUTINE W REFLEX MICROSCOPIC  RAPID URINE DRUG SCREEN, HOSP PERFORMED  PROTIME-INR  BLOOD GAS, VENOUS  VALPROIC ACID LEVEL  AMMONIA  TROPONIN I (HIGH SENSITIVITY)    EKG EKG Interpretation  Date/Time:  Monday October 22 2020 15:14:04 EST Ventricular Rate:  79 PR Interval:    QRS Duration: 166 QT Interval:  445 QTC Calculation: 511 R Axis:   -46 Text Interpretation: Sinus rhythm Ventricular premature complex Consider left atrial enlargement Left bundle branch block similar to previous wit pvc Confirmed by Charlesetta Shanks [phone]) on 10/22/2020 3:57:53 PM   Radiology No results found.  Procedures Procedures   Medications Ordered in  ED Medications - No data to display  ED Course  I have reviewed the triage vital signs and the nursing notes.  Pertinent labs & imaging results that were available during my care of the patient were reviewed by me and considered in my medical decision making (see chart for details).    MDM Rules/Calculators/A&P                          Patient is brought in very poorly responsive.  Reportedly he had a seizure that was witnessed.  Patient has known history of seizure disorder.  At this time patient is most likely postictal.  Patient however is supposed to be taking Eliquis.  We will proceed with CT head for possible trauma or spontaneous bleed.  At this time, patient is protecting his airway.  There is no active seizure activity at this time.  We will continue to monitor and do basic metabolic work-up as well.  At shift change, patient care to Dr. Roderic Palau for follow-up on results and final disposition. Final Clinical Impression(s) / ED Diagnoses Final diagnoses:  Altered mental status, unspecified altered mental status type  Seizure disorder [facility])    Rx / DC Orders ED Discharge Orders    None       Charlesetta Shanks, MD 10/22/20 1600

## 2020-10-24 LAB — URINE CULTURE: Culture: >=100000 — AB

## 2020-10-25 ENCOUNTER — Emergency Department (HOSPITAL_COMMUNITY)
Admission: EM | Admit: 2020-10-25 | Discharge: 2020-11-12 | Disposition: A | Discharge status: Home / Self Care | Payer: No Typology Code available for payment source | Attending: Emergency Medicine | Admitting: Emergency Medicine

## 2020-10-25 ENCOUNTER — Other Ambulatory Visit: Payer: Self-pay

## 2020-10-25 DIAGNOSIS — F1410 Cocaine abuse, uncomplicated: Secondary | ICD-10-CM | POA: Diagnosis present

## 2020-10-25 DIAGNOSIS — N184 Chronic kidney disease, stage 4 (severe): Secondary | ICD-10-CM | POA: Diagnosis not present

## 2020-10-25 DIAGNOSIS — F339 Major depressive disorder, recurrent, unspecified: Secondary | ICD-10-CM | POA: Diagnosis not present

## 2020-10-25 DIAGNOSIS — I130 Hypertensive heart and chronic kidney disease with heart failure and stage 1 through stage 4 chronic kidney disease, or unspecified chronic kidney disease: Secondary | ICD-10-CM | POA: Diagnosis not present

## 2020-10-25 DIAGNOSIS — Z20822 Contact with and (suspected) exposure to covid-19: Secondary | ICD-10-CM | POA: Insufficient documentation

## 2020-10-25 DIAGNOSIS — I509 Heart failure, unspecified: Secondary | ICD-10-CM | POA: Diagnosis not present

## 2020-10-25 DIAGNOSIS — F1994 Other psychoactive substance use, unspecified with psychoactive substance-induced mood disorder: Secondary | ICD-10-CM

## 2020-10-25 DIAGNOSIS — Z23 Encounter for immunization: Secondary | ICD-10-CM | POA: Insufficient documentation

## 2020-10-25 DIAGNOSIS — F17210 Nicotine dependence, cigarettes, uncomplicated: Secondary | ICD-10-CM | POA: Insufficient documentation

## 2020-10-25 DIAGNOSIS — E1122 Type 2 diabetes mellitus with diabetic chronic kidney disease: Secondary | ICD-10-CM | POA: Insufficient documentation

## 2020-10-25 DIAGNOSIS — Z7901 Long term (current) use of anticoagulants: Secondary | ICD-10-CM | POA: Insufficient documentation

## 2020-10-25 DIAGNOSIS — Z7982 Long term (current) use of aspirin: Secondary | ICD-10-CM | POA: Insufficient documentation

## 2020-10-25 DIAGNOSIS — Z046 Encounter for general psychiatric examination, requested by authority: Secondary | ICD-10-CM | POA: Diagnosis present

## 2020-10-25 DIAGNOSIS — Z79899 Other long term (current) drug therapy: Secondary | ICD-10-CM | POA: Insufficient documentation

## 2020-10-25 DIAGNOSIS — F32A Depression, unspecified: Secondary | ICD-10-CM

## 2020-10-25 LAB — RAPID URINE DRUG SCREEN, HOSP PERFORMED
Amphetamines: NOT DETECTED
Barbiturates: NOT DETECTED
Benzodiazepines: NOT DETECTED
Cocaine: POSITIVE — AB
Opiates: NOT DETECTED
Tetrahydrocannabinol: NOT DETECTED

## 2020-10-25 LAB — COMPREHENSIVE METABOLIC PANEL
ALT: 12 U/L (ref 0–44)
AST: 17 U/L (ref 15–41)
Albumin: 2.9 g/dL — ABNORMAL LOW (ref 3.5–5.0)
Alkaline Phosphatase: 61 U/L (ref 38–126)
Anion gap: 10 (ref 5–15)
BUN: 50 mg/dL — ABNORMAL HIGH (ref 8–23)
CO2: 26 mmol/L (ref 22–32)
Calcium: 9.4 mg/dL (ref 8.9–10.3)
Chloride: 104 mmol/L (ref 98–111)
Creatinine, Ser: 2.35 mg/dL — ABNORMAL HIGH (ref 0.61–1.24)
GFR, Estimated: 29 mL/min — ABNORMAL LOW (ref >60)
Glucose, Bld: 91 mg/dL (ref 70–99)
Potassium: 4.8 mmol/L (ref 3.5–5.1)
Sodium: 140 mmol/L (ref 135–145)
Total Bilirubin: 0.5 mg/dL (ref 0.3–1.2)
Total Protein: 7.1 g/dL (ref 6.5–8.1)

## 2020-10-25 LAB — CBC
HCT: 45.3 % (ref 39.0–52.0)
Hemoglobin: 13.8 g/dL (ref 13.0–17.0)
MCH: 27.2 pg (ref 26.0–34.0)
MCHC: 30.5 g/dL (ref 30.0–36.0)
MCV: 89.2 fL (ref 80.0–100.0)
Platelets: 363 10*3/uL (ref 150–400)
RBC: 5.08 MIL/uL (ref 4.22–5.81)
RDW: 13.2 % (ref 11.5–15.5)
WBC: 6.4 10*3/uL (ref 4.0–10.5)
nRBC: 0 % (ref 0.0–0.2)

## 2020-10-25 LAB — ETHANOL: Alcohol, Ethyl (B): <10 mg/dL (ref <10)

## 2020-10-25 LAB — VALPROIC ACID LEVEL: Valproic Acid Lvl: 37 ug/mL — ABNORMAL LOW (ref 50.0–100.0)

## 2020-10-25 LAB — SARS CORONAVIRUS 2 (TAT 6-24 HRS): SARS Coronavirus 2: NEGATIVE

## 2020-10-25 LAB — SALICYLATE LEVEL: Salicylate Lvl: <7 mg/dL — ABNORMAL LOW (ref 7.0–30.0)

## 2020-10-25 LAB — ACETAMINOPHEN LEVEL: Acetaminophen (Tylenol), Serum: <10 ug/mL — ABNORMAL LOW (ref 10–30)

## 2020-10-25 MED ORDER — DULOXETINE HCL 60 MG PO CPEP
60.0000 mg | ORAL_CAPSULE | Freq: Two times a day (BID) | ORAL | Status: DC
Start: 2020-10-26 — End: 2020-11-12
  Administered 2020-10-26 – 2020-11-12 (×35): 60 mg via ORAL
  Filled 2020-10-25 (×36): qty 1

## 2020-10-25 MED ORDER — TRIAMTERENE-HCTZ 75-50 MG PO TABS
1.0000 | ORAL_TABLET | Freq: Every day | ORAL | Status: DC
  Administered 2020-10-25 – 2020-11-12 (×18): 1 via ORAL
  Filled 2020-10-25 (×23): qty 1

## 2020-10-25 MED ORDER — APIXABAN 5 MG PO TABS
5.0000 mg | ORAL_TABLET | Freq: Two times a day (BID) | ORAL | Status: DC
  Administered 2020-10-25 – 2020-11-12 (×30): 5 mg via ORAL
  Filled 2020-10-25 (×32): qty 1

## 2020-10-25 MED ORDER — FAMOTIDINE 20 MG PO TABS
20.0000 mg | ORAL_TABLET | Freq: Every day | ORAL | Status: DC | PRN
  Filled 2020-10-25: qty 1

## 2020-10-25 MED ORDER — TAMSULOSIN HCL 0.4 MG PO CAPS
0.4000 mg | ORAL_CAPSULE | Freq: Every day | ORAL | Status: DC
  Administered 2020-10-27 – 2020-11-11 (×16): 0.4 mg via ORAL
  Filled 2020-10-25 (×15): qty 1

## 2020-10-25 MED ORDER — LEVETIRACETAM 500 MG PO TABS: 500.0000 mg | ORAL_TABLET | Freq: Two times a day (BID) | ORAL | Status: DC

## 2020-10-25 MED ORDER — TRAZODONE HCL 50 MG PO TABS
50.0000 mg | ORAL_TABLET | Freq: Every day | ORAL | Status: DC
  Administered 2020-10-25 – 2020-11-11 (×18): 50 mg via ORAL
  Filled 2020-10-25 (×18): qty 1

## 2020-10-25 MED ORDER — DIVALPROEX SODIUM 250 MG PO DR TAB
500.0000 mg | DELAYED_RELEASE_TABLET | Freq: Two times a day (BID) | ORAL | Status: DC
  Administered 2020-10-25 – 2020-11-12 (×36): 500 mg via ORAL
  Filled 2020-10-25 (×36): qty 2

## 2020-10-25 MED ORDER — GABAPENTIN 300 MG PO CAPS
300.0000 mg | ORAL_CAPSULE | Freq: Two times a day (BID) | ORAL | Status: DC
  Administered 2020-10-25 – 2020-11-12 (×36): 300 mg via ORAL
  Filled 2020-10-25 (×36): qty 1

## 2020-10-25 NOTE — ED Notes (Signed)
1st exam completed by Dr. Laverta Baltimore, in process of being copied and faxed
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
## 2020-10-25 NOTE — BH Assessment (Signed)
Comprehensive Clinical Assessment (CCA) Note  10/26/2020 [name] [id number]  Chief Complaint: No chief complaint on file.  Visit Diagnosis:  F33.9 Major depressive disorder, Recurrent episode, Unspecified   Tim Bradley is a 70  Years old patient present involuntarily to [name] via Event organiser and accompanied by himself.  Pt IVC reads "Patient arrives under IVC with thoughts of strangling his girlfriend and reports hearing voices.  Denies suicidal thoughts.  Pt reports he has been feelings depressed, "I am tired of living the way I am, I cannot take care of myself and I am living in feces".  Pt acknowledges the following symptoms: withdrawal, loss of interest, decreased concentration, decreased sleep, "I have not eaten in days, I only eat when mobil meals deliver me a meal".  Pt denies any manic symptoms.  Pt reports that he has have thoughts of hurting himself.  Pt denies current homicidal ideation or history of violence.  Pt reports that he does hear voices "[place], I hear voices daily".  Pt says he drinks occassionally, also, admitted to smoking crack cocaine three times a month.  Pt reports that he does not smoke marijuana anymore.  Pt reports that he smoke cigerettes daily.  Pt identifies his primary stressor as loneness and not being able to take care of himself.  Pt lives alone and he gave TTS Counselor permission to contact his sister Tim Bradley [phone], unable to contact and gather collateral information.  Pt dies any history of abuse or trauma.  Pt denies any history of mental illness or substance use in his family.  Pt says he is not currently receiving weekly outpatient therapy.  Pt acknowledged that he has a forthcoming appointment with the [facility] to see a a counselor, not sure about the date.  Pt reports that he takes medication as prescribed.  Pt reports previous inpatient  hospitalization at [facility].  Pt is dressed in scrubs, alert, orient x 3 with  normal speech and restless motor behavior.  Eye contact is starring and his mood is depressed.  Pt affect is flat.  Pt thought process is personalized.  [name] insight is lacking, and judgement is poor.  There is no indication Pt is currently responding to internal stimuli or experiencing delusional thought content.  Pt was cooperative throughout assessment.    Disposition: Quintella Baton NP, recommends Geriatric Psychiatric unit.  Pt has VA Benefits.  Disposition discuss with Oceanographer via secure chat in [place].  Disposition Social Worker will secure placement in the AM.   CCA Screening, Triage and Referral (STR)  Patient Reported Information How did you hear about [place]? Legal System  Referral name: Patient presents via EMS after he contacted them.  Referral phone number: No data recorded  Whom do you see for routine medical problems? -- ([facility])  Practice/Facility Name: No data recorded Practice/Facility Phone Number: No data recorded Name of Contact: No data recorded Contact Number: No data recorded Contact Fax Number: No data recorded Prescriber Name: No data recorded Prescriber Address (if known): No data recorded  What Is the Reason for Your Visit/Call Today? Depression  How Long Has This Been Causing You Problems? 1-6 months  What Do You Feel Would Help You the Most Today? -- (UTA)   Have You Recently Been in Any Inpatient Treatment (Hospital/Detox/Crisis Center/28-Day Program)? No  Name/Location of Program/Hospital:No data recorded How Long Were You There? No data recorded When Were You Discharged? No data recorded  Have You Ever  Received Services From [facility] Before? Yes  Who Do You See at [facility]? 09/28/20   Have You Recently Had Any Thoughts About Hurting Yourself? Yes (Pt reports that "I am tired of not being able to help myself, I thinking about Suicidal thoughts".)  Are You Planning to Commit Suicide/Harm Yourself At This time? No   Have you Recently Had  Thoughts About [place]? No  Explanation: No data recorded  Have You Used Any Alcohol or Drugs in the Past 24 Hours? No (Pt reports "not today")  How Long Ago Did You Use Drugs or Alcohol? No data recorded What Did You Use and How Much? No data recorded  Do You Currently Have a Therapist/Psychiatrist? No (Pt reports that he visit the [facility] for all his doctor's appointment, I" I think I have one scheduled, I don't remember".)  Name of Therapist/Psychiatrist: Pt reports that he visits the [facility] for all doctor's appointments.   Have You Been Recently Discharged From Any Office Practice or Programs? No  Explanation of Discharge From Practice/Program: No data recorded    CCA Screening Triage Referral Assessment Type of Contact: Tele-Assessment  Is this Initial or Reassessment? Initial Assessment  Date Telepsych consult ordered in [facility]:  10/26/2020  Time Telepsych consult ordered in [facility]:  No data recorded  Patient Reported Information Reviewed? Yes  Patient Left Without Being Seen? No data recorded Reason for Not Completing Assessment: No data recorded  Collateral Involvement: Pt provided Tim Bradley (sister) [phone], unable to contact, was not able to leave a hippa voice mail.   Does Patient Have a Stage manager Guardian? No data recorded Name and Contact of Legal Guardian: No data recorded If Minor and Not Living with Parent(s), Who has Custody? n/a  Is CPS involved or ever been involved? Never  Is APS involved or ever been involved? -- (UTA)   Patient Determined To Be At Risk for Harm To Self or Others Based on Review of Patient Reported Information or Presenting Complaint? No  Method: No data recorded Availability of Means: No data recorded Intent: No data recorded Notification Required: No data recorded Additional Information for Danger to Others Potential: No data recorded Additional Comments for Danger to Others Potential: No data recorded Are  There Guns or Other Weapons in Your Home? No data recorded Types of Guns/Weapons: No data recorded Are These Weapons Safely Secured?                            No data recorded Who Could Verify You Are Able To Have These Secured: No data recorded Do You Have any Outstanding Charges, Pending Court Dates, Parole/Probation? No data recorded Contacted To Inform of Risk of Harm To Self or Others: Law Enforcement (Already notified.)   Location of Assessment: [facility] ED   Does Patient Present under Involuntary Commitment? Yes  IVC Papers Initial File Date: 10/25/2020   [place] of Residence: [place]   Patient Currently Receiving the Following Services: -- (UTA)   Determination of Need: Routine (7 days)   Options For Referral: -- (UTA)     CCA Biopsychosocial Intake/Chief Complaint:  depression  Current Symptoms/Problems: isolation,  loss of interest   Patient Reported Schizophrenia/Schizoaffective Diagnosis in Past: No   Strengths: UTA  Preferences: UTA  Abilities: UTA   Type of Services Patient Feels are Needed: UTA   Initial Clinical Notes/Concerns: SI, not eating   Mental Health Symptoms Depression:  Change in energy/activity;  Difficulty Concentrating; Fatigue; Hopelessness; Increase/decrease in appetite (Pt reports have not eating in several days.)   Duration of Depressive symptoms: Greater than two weeks   Mania:  Recklessness; Irritability   Anxiety:   Difficulty concentrating; Fatigue; Irritability; Restlessness; Sleep   Psychosis:  None   Duration of Psychotic symptoms: No data recorded  Trauma:  Detachment from others (Pt reports that his wife died three years ago, isolating from others. "I am tired of my girlfriend".)   Obsessions:  Disrupts routine/functioning   Compulsions:  Disrupts with routine/functioning; Repeated behaviors/mental acts   Inattention:  Disorganized; Forgetful; Loses things   Hyperactivity/Impulsivity:  N/A    Oppositional/Defiant Behaviors:  None   Emotional Irregularity:  Chronic feelings of emptiness; Frantic efforts to avoid abandonment; Recurrent suicidal behaviors/gestures/threats   Other Mood/Personality Symptoms:  UTA    Mental Status Exam Appearance and self-care  Stature:  Average   Weight:  Average weight   Clothing:  -- (Pt dressed in scrubs)   Grooming:  No data recorded  Cosmetic use:  None   Posture/gait:  -- (UTA)   Motor activity:  Agitated; Restless   Sensorium  Attention:  Distractible   Concentration:  Scattered   Orientation:  Object; Person; Place   Recall/memory:  Defective in Short-term; Defective in Recent   Affect and Mood  Affect:  Flat   Mood:  Depressed   Relating  Eye contact:  Staring   Facial expression:  Sad   Attitude toward examiner:  Cooperative   Thought and Language  Speech flow: Mute   Thought content:  Personalizations   Preoccupation:  Ruminations   Hallucinations:  None   Organization:  No data recorded  [employer] of Knowledge:  Fair   Intelligence:  Needs investigation   Abstraction:  -- (UTA)   Judgement:  Poor   Reality Testing:  Distorted   Insight:  Lacking   Decision Making:  Paralyzed   Social Functioning  Social Maturity:  -- Special educational needs teacher)   Social Judgement:  -- (UTA)   Stress  Stressors:  Relationship; Family conflict (Pt reports that he lives a lone, his girlfriend is not helpful.)   Coping Ability:  Exhausted   Skill Deficits:  Decision making; Interpersonal; Self-care; Self-control   Supports:  Friends/Service system     Religion: Religion/Spirituality Are You A Religious Person?:  (UTA) How Might This Affect Treatment?: UTA  Leisure/Recreation: Leisure / Recreation Do You Have Hobbies?:  (Pt reports he watchs lot of television)  Exercise/Diet: Exercise/Diet Have You Gained or Lost A Significant Amount of Weight in the Past Six Months?:  (UTA) Do You Follow a  Special Diet?:  (Pt reports he eats, when he receive a meal from mobil meal) Do You Have Any Trouble Sleeping?: No   CCA Employment/Education Employment/Work Situation: Employment / Work Situation Employment situation: On disability Why is patient on disability: UTA How long has patient been on disability: UTA Patient's job has been impacted by current illness: No What is the longest time patient has a held a job?: UTA Where was the patient employed at that time?: UTA Has patient ever been in the [employer]?: Yes (Describe in comment)  Education: Education Is Patient Currently Attending School?: No Last Grade Completed:  (UTA) Name of [employer]: UTA Did Teacher, adult education From [employer]?: No Did You Attend College?: No Did You Attend Graduate School?: No Did You Have Any Special Interests In School?: 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 Did  You Have An Individualized Education Program (IIEP):  (UTA) Did You Have Any Difficulty At School?:  (UTA) Patient's Education Has Been Impacted by Current Illness:  (UTA)   CCA Family/Childhood History Family and Relationship History: Family history Marital status: Widowed Widowed, when?: 2020 Are you sexually active?:  (UTA) What is your sexual orientation?: UTA Has your sexual activity been affected by drugs, alcohol, medication, or emotional stress?: UTA Does patient have children?:  (UTA)  Childhood History:  Childhood History By whom was/is the patient raised?:  (UTA) Additional childhood history information: UTA Description of patient's relationship with caregiver when they were a child: UTA Patient's description of current relationship with people who raised him/her: Pt reports his mother is supportive How were you disciplined when you got in trouble as a child/adolescent?: UTA Does patient have siblings?: Yes Number of Siblings: 1 Description of patient's current relationship with siblings: supportive Did patient  suffer any verbal/emotional/physical/sexual abuse as a child?: No Did patient suffer from severe childhood neglect?: No Has patient ever been sexually abused/assaulted/raped as an adolescent or adult?: No Was the patient ever a victim of a crime or a disaster?: No Witnessed domestic violence?: No Has patient been affected by domestic violence as an adult?: No  Child/Adolescent Assessment:     CCA Substance Use Alcohol/Drug Use: Alcohol / Drug Use Pain Medications: See MRA Prescriptions: See MRA Over the Counter: See MRA History of alcohol / drug use?: Yes Substance #1 Name of Substance 1: Alcohol 1 - Age of First Use: 14 1 - Amount (size/oz): 2 16 oz 1 - Frequency: occassion 1 - Duration: ongoing 1 - Last Use / Amount: UTA Substance #2 Name of Substance 2: Crack Cocain 2 - Age of First Use: UTA 2 - Amount (size/oz): UTA 2 - Frequency: 3 x a month 2 - Duration: ongoing 2 - Last Use / Amount: 3 weeks ago Substance #3 Name of Substance 3: marijuana 3 - Age of First Use: UTA 3 - Amount (size/oz): UTA 3 - Frequency: UTA 3 - Duration: UTA 3 - Last Use / Amount: UTA                   ASAM's:  Six Dimensions of Multidimensional Assessment  Dimension 1:  Acute Intoxication and/or Withdrawal Potential:      Dimension 2:  Biomedical Conditions and Complications:      Dimension 3:  Emotional, Behavioral, or Cognitive Conditions and Complications:     Dimension 4:  Readiness to Change:     Dimension 5:  Relapse, Continued use, or Continued Problem Potential:     Dimension 6:  Recovery/Living Environment:     ASAM Severity Score:    ASAM Recommended Level of Treatment:     Substance use Disorder (SUD)    Recommendations for Services/Supports/Treatments:    DSM5 Diagnoses: Patient Active Problem List   Diagnosis Date Noted  . Right leg DVT ([place]) 09/30/2020  . Seizure disorder ([place]) 09/29/2020  . Cardiomyopathy ([place]) 09/28/2020  . AKI (acute kidney injury)  ([place]) 09/28/2020  . Pressure injury of skin 05/03/2020  . Acute encephalopathy 05/02/2020  . Diarrhea 05/02/2020  . QT prolongation 05/02/2020  . 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 Essential hypertension 04/11/2020  . Leg edema 04/11/2020  . Dyspnea 04/11/2020  . Acute on chronic congestive heart failure ([place]) 04/11/2020  . BPH (benign prostatic hyperplasia) 04/11/2020  . 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  Generalized weakness 04/11/2020  . Seizure ([place]) 07/21/2019  . Cocaine abuse ([place])   . Constipation 08/22/2018  . Gastritis 08/20/2018  . Heme positive stool   . Acute blood loss anemia secondary to massive gastric ulcer   . Hyponatremia   . Abscess, gluteal, left 03/26/2017  . Type 2 diabetes mellitus ([place]) 03/26/2017  . Adrenal nodule ([place]) 03/26/2017  . Closed fracture of body of thoracic vertebra ([place]) 03/26/2017  . Spinal stenosis of lumbar region 03/26/2017      Referrals to Alternative Service(s): Referred to Alternative Service(s):   Place:   Date:   Time:    Referred to Alternative Service(s):   Place:   Date:   Time:    Referred to Alternative Service(s):   Place:   Date:   Time:    Referred to Alternative Service(s):   Place:   Date:   Time:     Leonides Schanz, Counselor

## 2020-10-25 NOTE — ED Provider Notes (Signed)
Blood pressure (!) 135/96, pulse (!) 127, temperature 98.4 F (36.9 C), temperature source Oral, resp. rate 18, SpO2 94 %.  Assuming care from Dr. Karle Starch.  In short, Tim Bradley is a 70 y.o. male with a chief complaint of No chief complaint on file. Marland Kitchen  Refer to the original H&P for additional details.  Patient is medically clear for TTS evaluation. IVC first exam completed with HI and AH expressed by patient. Home meds will be reconciled and re-ordered. Patient calm and cooperative.   Med rec completed and meds ordered.     Margette Fast, MD 10/25/20 2128

## 2020-10-25 NOTE — ED Triage Notes (Signed)
Pt IVC'd by sister for inability to care for himself. Pt is bed bound, doesn't have heat at his house, no source of food.

## 2020-10-25 NOTE — ED Notes (Signed)
[name] belongings, including a shirt, pants and two socks locked in locker #7.

## 2020-10-26 NOTE — Care Management (Signed)
Per chart review,  Tim Bradley at [facility]. They will not be able to accept pt as he requires total care and is using a catheter. Not being able to perform his ADLs is also exclusionary criteria for other geri-psychiatric hospitals. CSW will coordinate with [facility] NP.    Therefore, patient has been referred to the following facilities:  [facility] Details  Fax          [address], [place] [address]     Internal comment    [facility] Details  Fax        [address] [place] [address]     Internal comment    [facility] Details  Fax        [address], [place] [address]     Internal comment    [facility] Details  Fax        [address] [place] [address]     Internal comment    [facility] Details  Fax        [address] [place] [address]     Internal comment    [facility] Details  Fax        [address] [place], [place] [address]     Internal comment    [facility] Details  Fax        [address] [place] [address]     Internal comment    [facility] Details  Fax        [address] [place] [address]     Internal comment    [facility] Office Details  Fax        [address] [place] [address]     Internal comment    [facility] Details  Fax        [address], [place] [address]     Internal comment

## 2020-10-26 NOTE — ED Notes (Signed)
Lunch tray given assisted with tray .

## 2020-10-26 NOTE — ED Notes (Addendum)
Cup of water, saltine crackers, and peanut butter given to pt

## 2020-10-26 NOTE — Progress Notes (Addendum)
CSW left message with April ([phone] ext [phone]) in admissions at [facility], requesting a return phone call to assess for bed availability.   CSW spoke with Hassan Rowan at [facility], who will call this writer back after their bed meeting to discuss bed availability. She did note that pt is linked with the [facility].    Disposition will continue to follow.    Audree Camel, MSW, LCSW, LCAS Clinical Social Worker II Disposition CSW [phone]   UPDATE 1127am:  Spoke with April a [facility]. They will not be able to accept pt as he requires total care and is using a catheter. Not being able to perform his ADLs is also exclusionary criteria for other geri-psychiatric hospitals. CSW will coordinate with [facility] NP.

## 2020-10-26 NOTE — ED Notes (Signed)
Breakfast tray given , patient is eating.

## 2020-10-26 NOTE — ED Notes (Signed)
Patient c/o pain in his heels now wounds noted patient placed in hospital bed . Had been incont of urine condom cath had come off , new clothes and condom cath applied. Patient cleaned.

## 2020-10-26 NOTE — BH Assessment (Signed)
Reassessment note: Pt presents lying in his bed with tv on. He states he is feeling "pretty good, better". Pt denies SI, HI and AVH. He reports he did make a smart remark but would never harm his girlfriend. Pt states he thinks [facility] admission could be helpful for him. Inpt tx continues to be recommendation.

## 2020-10-26 NOTE — ED Notes (Signed)
Sleeping

## 2020-10-26 NOTE — ED Notes (Signed)
Dinner Trays Ordered @ 1635. 

## 2020-10-27 DIAGNOSIS — F1994 Other psychoactive substance use, unspecified with psychoactive substance-induced mood disorder: Secondary | ICD-10-CM

## 2020-10-27 NOTE — ED Notes (Signed)
Tim Bradley states that home health services will not work for the pt as he does not have a home to go back to. There "is no one to claim him" and he will need SNF placement due to weakness and inability to ambulate on his own. Pt also incontinent and is unable to get to the bathroom on his own and to clean himself up.

## 2020-10-27 NOTE — ED Notes (Signed)
Breakfast Ordered 

## 2020-10-27 NOTE — ED Notes (Addendum)
[name] IVC paper work rescinded by doctor [name] at this time. He has been psych cleared and SW working on placement.

## 2020-10-27 NOTE — ED Provider Notes (Signed)
Emergency Medicine Observation Re-evaluation Note  Tim Bradley is a 70 y.o. male, seen on rounds today.  Pt initially presented to the ED for complaints of No chief complaint on file. Currently, the patient is resting comfortably.  3:30 PM-contacted by case management who states that the patient is ready to go home, and that they have recommended home health services with nurse, physical therapy, aide and social work, to help him.  I will order these.  Physical Exam  BP 127/81 (BP Location: Right Arm)   Pulse 74   Temp 98.2 F (36.8 C) (Oral)   Resp 20   Ht '6\' 2"'$  (1.88 m)   Wt 92.9 kg   SpO2 95%   BMI 26.30 kg/m  Physical Exam General: Calm Cardiac: Normal heart Lungs: Normal respiratory rate Psych: Not responding to internal stimuli  ED Course / MDM  EKG:EKG Interpretation  Date/Time:  Thursday October 25 2020 13:37:27 EST Ventricular Rate:  102 PR Interval:  136 QRS Duration: 150 QT Interval:  394 QTC Calculation: 513 R Axis:   -33 Text Interpretation: Sinus tachycardia Right atrial enlargement Left axis deviation Left bundle branch block Abnormal ECG Confirmed by Davonna Belling [phone]) on 10/25/2020 1:45:31 PM  Clinical Course as of 10/27/20 1533  Thu Oct 25, 2020  1457 CBC is normal, EtOH, ASA and APAP levels neg. CMP with CKD at baseline.  [CS]  1459 Depakote is borderline low.  [CS]  [id number] Care of the patient signed out to Dr. Laverta Baltimore, Psych hold and TTS consults put in. Medically cleared.  [CS]    Clinical Course User Index [CS] Truddie Hidden, MD   I have reviewed the labs performed to date as well as medications administered while in observation.  Recent changes in the last 24 hours include he remains comfortable and cooperative.  Plan  Current plan is for discharge with home health if able, versus place in a nursing home.  He has been cleared by psychiatry.  He becomes an observation/boarding patient at this time.  He does not have enough resources to go  home alone today where he apparently lives alone.  Social worker is exploring placement versus home with home health services.  Unfortunate home health services cannot be arranged immediately this weekend or in the near future.  We will look to have social work see him daily and update on plans for placement versus discharge.  I have rescinded his IVC. Patient is not under full IVC at this time.   Daleen Bo, MD 10/27/20 1620

## 2020-10-27 NOTE — ED Notes (Signed)
Updated sister [name] with pt okay. Will be by in the morning time to see pt and bring him pajamas.

## 2020-10-27 NOTE — BHH Suicide Risk Assessment (Cosign Needed Addendum)
Suicide Risk Assessment  Discharge Assessment   [facility] Discharge Suicide Risk Assessment   Principal Problem: Substance induced mood disorder [facility]) Discharge Diagnoses: Principal Problem:   Substance induced mood disorder [facility]) Active Problems:   Cocaine abuse [facility])  January 29,2022: Tim Bradley, 70 y.o., male patient admitted to [facility] for evaluation of seizure but subsequently found to have homicidal ideations, per IVC plans to harm his girlfriend.  Patient seen via telepsych by this provider; chart reviewed and consulted with Dr. Dwyane Dee on 10/27/20.  On evaluation Tim Bradley reports is calm, and agrees to talk with this Probation officer and Tim Bradley , LCSW who also participates in assessment.    The patient denies suicidal or homicidal ideations. He states his presenting homicidal ideation was in the contest of substance abuse;  admission UDS was + for cocaine.  He also relates challenges he has getting adequate home care.  Since admission, patient has remained at [facility] while awaiting acceptance from an inpatient psychiatric unit.  He was started on 3 psychotropic medications and monitored for mood stability.  Since receiving treatment, he now demonstrates symptomatic improvement.  He denies medication side effects and sleep and appetite are good.  He no longer endorses suicidal or homicidal ideations, denies audible or visual hallucinations.  He is very interactive during the assessment today, appears Bradley of his [place] history and happy to share stories.  Nursing notes collaborate patient has been calm and appropriately interactiveThere are psychiatric safety concerns observed today but I do think he would benefit from outpatient follow-up for medication management as well as other care services.  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  He is currently immobile, with the exception of feeding  himself he needs total care.  He currently resides home alone, but has a girlfriend whom he states visits him daily, helps him "spend his money" but provides no ADL assistance.  He has other family members who live in close proximity but unable to care for him daily.  We discussed the concern of him returning home with inadequate resources for care.  Home care options were explored with him but he declines most citing gross despair of his current living conditions.  He does not want to go to an assistive living facility.  Offered substance abuse treatment but he states he does not need substance abuse treatment and feels this is no longer a problem for him.     Per EDP Admission Note dates 10/22/2020: Tim Bradley is a 70 y.o. male.  HPI Very limited history at this time.  Patient had a witnessed seizure lasting less than a minute.  Afterwards, patient has been very drowsy and not able to arouse.  Patient is currently not answering any questions.  Last seizure was reportedly a week ago.  At this time, no information on patient's compliance with medications or other associated injury.  Total Time spent with patient: 30 minutes  Musculoskeletal: Unable to assess via Telepsych visit  Psychiatric Specialty Exam: '@ROSBYAGE'$ @  Blood pressure 122/72, pulse 80, temperature 98.2 F (36.8 C), temperature source Oral, resp. rate 18, height '6\' 2"'$  (1.88 m), weight 92.9 kg, SpO2 98 %.Body mass index is 26.3 kg/m.  General Appearance: Casual and Fairly Groomed  Eye Contact::  Good  Speech:  Clear and Coherent and Normal Rate  Volume:  Normal  Mood:  Euthymic  Affect:  Appropriate and Congruent  Thought  Process:  Coherent, Goal Directed and Descriptions of Associations: Intact  Orientation:  Full (Time, Place, and Person)  Thought Content:  Logical  Suicidal Thoughts:  No  Homicidal Thoughts:  No  Memory:  Immediate;   Good Recent;   Good Remote;   Good  Judgement:  Good  Insight:  Fair   Psychomotor Activity:  Normal  Concentration:  Good  Recall:  Good  Fund of Knowledge:Good  Language: Good  Akathisia:  NA  Handed:  Right  AIMS (if indicated):     Assets:  Communication Skills Desire for Improvement Housing Social Support  Sleep:     Cognition: WNL  ADL's:  Intact   Mental Status Per Nursing Assessment::   On Admission:     Demographic Factors:  Male, Age 72 or older, Low socioeconomic status and Living alone  Loss Factors: NA  Historical Factors: NA  Risk Reduction Factors:   Sense of responsibility to family and Positive social support  Continued Clinical Symptoms:  Alcohol/Substance Abuse/Dependencies  Cognitive Features That Contribute To Risk:  None    Suicide Risk:  Minimal: No identifiable suicidal ideation.  Patients presenting with no risk factors but with morbid ruminations; may be classified as minimal risk based on the severity of the depressive symptoms  Plan Of Care/Follow-up recommendations:  Recommendations: Plan- As per above assessment, there are no current grounds for involuntary commitment at this time.?  Patient is not currently interested in inpatient services, but expresses agreement to continue outpatient treatment - we have reviewed importance of substance abuse abstinence, potential negative impact substance abuse can have on his relationships and level of functioning, and importance of medication compliance.   Patient is immobile and homebound and is eligible for care at the [place].  He may benefit from referral from, HUD/VASH resources,  medication and care management, substance abuse resources and additional resources for outpatient care.  Per records, his current home structural environment may present safety concerns.  A SW consult was supported for coordination of services.   Disposition: No evidence of imminent risk to self or others at present.   Patient does not meet criteria for psychiatric inpatient  admission. Supportive therapy provided about ongoing stressors. Discussed crisis plan, support from social network, calling 911, coming to the Emergency Department, and calling Suicide Hotline.   Was provided EDP contact # from [facility] charge nurse but unable to reach anyone at this time.  Tim Fabian, LCSW shared above information with treating RN as well as recommendations for IVC to be rescinded.   Tim Bradley, dispositions SW [facility] was also notified of above recommendations and patient disposition.    Mallie Darting, NP 10/27/2020, 12:42 PM

## 2020-10-27 NOTE — ED Notes (Addendum)
Pt attempted to walk with walker. He could bear weight with staff on either side helping him get to his feet from sitting position. He was able to shuffle a few steps but then said he had to sit. Wheeled to shower room and he was able to pivot into shower chair with two person assist. Showered. He states this is first one he has had in months. Wheeled back to room. Sheets changed and he is now resting in recliner. He voiced appreciation and was very cooperative with staff.

## 2020-10-27 NOTE — Discharge Planning (Signed)
RNCM awaiting call from [facility] regarding resumption of care upon discharge.

## 2020-10-27 NOTE — ED Notes (Signed)
Pt requested peanut butter and graham crackers with coke. Pt watching tv.

## 2020-10-27 NOTE — ED Notes (Signed)
PT HAS BEEN PSYC CLEARED AT THIS TIME.

## 2020-10-27 NOTE — Discharge Planning (Signed)
Pt currently active with [facility] for RN/PT/NA/SW services.  Resumption of care requested. Corene Cornea of [facility] notified.  No DME needs identified at this time.

## 2020-10-27 NOTE — ED Notes (Signed)
Social workers at bedside.

## 2020-10-27 NOTE — ED Notes (Signed)
Pt sleeping soundly. No distress noted.

## 2020-10-27 NOTE — Progress Notes (Signed)
CSW spoke with patient at bedside to complete discussion. Patient states he resides with a lady friend. Patient reports he is unable to ambulate independently and uses a wheelchair to navigate inside his home. Patient reports his right hand is contracted and he is unable to use it. Patient reports he is not able to complete many of his ADL's due to him being "numb" from the waist down and is also incontinent. Patient and CSW discussed barriers to placement at SNF, specifically his ongoing substance abuse. Patient reports he has snorted cocaine since 1971. Patient expressed no desire to change his behavior. Patient reports he owns his home and receives SSI for income. Patient reports his wife passed away three years ago which caused him to begin experiencing some SI and HI, stating he does not have a plan or true intent. Patient denies any SI and HI at this time. Patient reports he has a history of depression and PTSD. Patient reports he access to a firearm in his home. Patient reports he receives his [place] services through the [place] in [place].  Patient reports his address is [address], [place], [place]. Patient report he has lived at this address since 41. Patient reports he only has one small step to go up to get into his home. Patient reports his phone number as [phone].  Patient agreeable to return home with [facility] services in place. Patient reports he feels safe at home.  Rosendo Gros, RN CM to contact [name] to discuss [facility] services.  Madilyn Fireman, MSW, LCSW-A Transitions of Care  Clinical Social Worker I [phone]

## 2020-10-28 NOTE — ED Notes (Signed)
Pt stood up with physical therapy to be cleaned of stool incontinence. Pt able to stand but is weak and needs assistance of a person and a walker. Clean brief placed on patient.

## 2020-10-28 NOTE — ED Notes (Signed)
SNF placement Breakfast order placed

## 2020-10-28 NOTE — ED Notes (Signed)
Dinner tray delivered.

## 2020-10-28 NOTE — Evaluation (Signed)
Physical Therapy Evaluation Patient Details Name: Tim Bradley MRN: [id number] DOB: 1951/03/08 Today's Date: 10/28/2020   History of Present Illness  Pt is a 70 y.o. M with significant PMH of diabetes mellitus, seizure, AKI, DVT who presents with weakness.  Clinical Impression  Prior to admission, pt lives alone and has very little caregiver support from a friend who stops by infrequently. Pt requires assist for transfers and ADL's and therefore he reports he often goes hours or sometimes days without getting cleaned up. On PT evaluation, pt presents with generalized weakness and balance deficits. Requiring mod-max assist to stand from the edge of bed and unable to walk. In light of deficits and decreased support, recommending SNF at discharge to maximize functional mobility.     Follow Up Recommendations SNF    Equipment Recommendations  None recommended by PT    Recommendations for Other Services       Precautions / Restrictions Precautions Precautions: Fall Restrictions Weight Bearing Restrictions: No      Mobility  Bed Mobility Overal bed mobility: Needs Assistance Bed Mobility: Supine to Sit;Sit to Supine     Supine to sit: Mod assist Sit to supine: Mod assist   General bed mobility comments: Pt initiating BLE movement to edge of bed, ultimately requiring assist to progress off and trunk to upright. Assist for BLE's back into bed    Transfers Overall transfer level: Needs assistance Equipment used: Rolling walker (2 wheeled) Transfers: Sit to/from Stand Sit to Stand: Mod assist;Max assist         General transfer comment: Mod-maxA to rise from bed x 2  Ambulation/Gait             General Gait Details: unable  Stairs            Wheelchair Mobility    Modified Rankin (Stroke Patients Only)       Balance Overall balance assessment: Needs assistance Sitting-balance support: Feet supported Sitting balance-Leahy Scale: Fair     Standing  balance support: Bilateral upper extremity supported Standing balance-Leahy Scale: Poor Standing balance comment: reliant on external support                             Pertinent Vitals/Pain Pain Assessment: Faces Faces Pain Scale: Hurts little more Pain Location: generalized Pain Descriptors / Indicators: Discomfort Pain Intervention(s): Monitored during session    Home Living Family/patient expects to be discharged to:: Private residence Living Arrangements: Alone Available Help at Discharge: Friend(s) Type of Home: House Home Access: Stairs to enter     Home Layout: One level Home Equipment: Environmental consultant - 2 wheels;Tub bench;Wheelchair - manual      Prior Function Level of Independence: Needs assistance   Gait / Transfers Assistance Needed: assist for transfers, wheelchair for mobility  ADL's / Homemaking Assistance Needed: dependent for bathing, toileting, dressing; able to self feed        Hand Dominance        Extremity/Trunk Assessment   Upper Extremity Assessment Upper Extremity Assessment: Generalized weakness    Lower Extremity Assessment Lower Extremity Assessment: Generalized weakness       Communication   Communication: No difficulties  Cognition Arousal/Alertness: Awake/alert Behavior During Therapy: WFL for tasks assessed/performed Overall Cognitive Status: Within Functional Limits for tasks assessed  General Comments      Exercises     Assessment/Plan    PT Assessment Patient needs continued PT services  PT Problem List Decreased strength;Decreased activity tolerance;Decreased balance;Decreased mobility       PT Treatment Interventions DME instruction;Functional mobility training;Therapeutic activities;Therapeutic exercise;Balance training;Patient/family education    PT Goals (Current goals can be found in the Care Plan section)  Acute Rehab PT Goals Patient Stated  Goal: to be able to walk again PT Goal Formulation: With patient Time For Goal Achievement: 11/11/20 Potential to Achieve Goals: Fair    Frequency Min 2X/week   Barriers to discharge Decreased caregiver support      Co-evaluation               AM-PAC PT "6 Clicks" Mobility  Outcome Measure Help needed turning from your back to your side while in a flat bed without using bedrails?: A Lot Help needed moving from lying on your back to sitting on the side of a flat bed without using bedrails?: A Lot Help needed moving to and from a bed to a chair (including a wheelchair)?: Total Help needed standing up from a chair using your arms (e.g., wheelchair or bedside chair)?: Total Help needed to walk in hospital room?: Total Help needed climbing 3-5 steps with a railing? : Total 6 Click Score: 8    End of Session Equipment Utilized During Treatment: Gait belt Activity Tolerance: Patient tolerated treatment well Patient left: in bed;with call bell/phone within reach Nurse Communication: Mobility status PT Visit Diagnosis: History of falling (Z91.81);Muscle weakness (generalized) (M62.81)    Time: [id number] PT Time Calculation (min) (ACUTE ONLY): 32 min   Charges:   PT Evaluation $PT Eval Moderate Complexity: 1 Mod PT Treatments $Therapeutic Activity: 8-22 mins        Wyona Almas, PT, DPT Acute Rehabilitation Services Pager [phone] Office [phone]   Deno Etienne 10/28/2020, 4:11 PM

## 2020-10-28 NOTE — NC FL2 (Addendum)
[place] LEVEL OF CARE SCREENING TOOL     IDENTIFICATION  Patient Name: Tim Bradley Birthdate: April 23, 1951 Sex: male Admission Date (Current Location): 10/25/2020  [facility] and [place] Number:  Herbalist and Address:  [facility]. [facility], [place] [address], [place], [place] [address]      Provider Number: [id number]  Attending Physician Name and Address:  Default, Provider, MD  Relative Name and Phone Number:       Current Level of Care: Hospital Recommended Level of Care: [place] Prior Approval Number:    Date Approved/Denied:   PASRR Number: [id number]  Discharge Plan: SNF    Current Diagnoses: Patient Active Problem List   Diagnosis Date Noted  . Substance induced mood disorder ([place]) 10/27/2020  . Right leg DVT ([place]) 09/30/2020  . Seizure disorder ([place]) 09/29/2020  . Cardiomyopathy ([place]) 09/28/2020  . AKI (acute kidney injury) ([place]) 09/28/2020  . Pressure injury of skin 05/03/2020  . Acute encephalopathy 05/02/2020  . Diarrhea 05/02/2020  . QT prolongation 05/02/2020  . Acute combined systolic and diastolic congestive heart failure ([place]) 04/22/2020  . Essential hypertension 04/11/2020  . Leg edema 04/11/2020  . Dyspnea 04/11/2020  . Acute on chronic congestive heart failure ([place]) 04/11/2020  . BPH (benign prostatic hyperplasia) 04/11/2020  . 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 Generalized weakness 04/11/2020  . Seizure ([place]) 07/21/2019  . Cocaine abuse ([place])   . Constipation 08/22/2018  . Gastritis 08/20/2018  . Heme positive stool   . Acute blood loss anemia secondary to massive gastric ulcer   . Hyponatremia   . Abscess, gluteal, left 03/26/2017  . Type 2 diabetes mellitus ([place]) 03/26/2017  . Adrenal nodule ([place]) 03/26/2017  . Closed fracture of body of thoracic vertebra ([place]) 03/26/2017  . Spinal stenosis of lumbar region 03/26/2017    Orientation  RESPIRATION BLADDER Height & Weight     Self,Time,Situation,Place  Normal Incontinent Weight: 204 lb 12.9 oz (92.9 kg) Height:  '6\' 2"'$  (188 cm)  BEHAVIORAL SYMPTOMS/MOOD NEUROLOGICAL BOWEL NUTRITION STATUS      Incontinent Diet (Normal)  AMBULATORY STATUS COMMUNICATION OF NEEDS Skin   Extensive Assist Verbally Normal                       Personal Care Assistance Level of Assistance  Bathing,Dressing,Feeding Bathing Assistance: Maximum assistance Feeding assistance: Limited assistance Dressing Assistance: Maximum assistance     Functional Limitations Info  Speech,Sight,Hearing Sight Info: Adequate Hearing Info: Adequate Speech Info: Adequate    SPECIAL CARE FACTORS FREQUENCY  PT (By licensed PT),OT (By licensed OT)     PT Frequency: 5x weekly OT Frequency: 5x weekly            Contractures Contractures Info: Not present    Additional Factors Info  Code Status,Allergies Code Status Info: Full Allergies Info: None           Current Medications (10/28/2020):  This is the current hospital active medication list Current Facility-Administered Medications  Medication Dose Route Frequency Provider Last Rate Last Admin  . apixaban (ELIQUIS) tablet 5 mg  5 mg Oral BID Long, Wonda Olds, MD   5 mg at 10/27/20 2202  . divalproex (DEPAKOTE) DR tablet 500 mg  500 mg Oral Q12H Long, Wonda Olds, MD   500 mg at 10/27/20 2202  . DULoxetine (CYMBALTA) DR capsule 60 mg  60 mg Oral BID Long, Wonda Olds,  MD   60 mg at 10/27/20 2203  . famotidine (PEPCID) tablet 20 mg  20 mg Oral Daily PRN Long, Wonda Olds, MD      . gabapentin (NEURONTIN) capsule 300 mg  300 mg Oral BID Long, Wonda Olds, MD   300 mg at 10/27/20 2203  . tamsulosin (FLOMAX) capsule 0.4 mg  0.4 mg Oral QPC supper Long, Wonda Olds, MD   0.4 mg at 10/27/20 2202  . traZODone (DESYREL) tablet 50 mg  50 mg Oral QHS Long, Wonda Olds, MD   50 mg at 10/27/20 2203  . triamterene-hydrochlorothiazide (MAXZIDE) 75-50 MG per tablet 1 tablet  1  tablet Oral Daily Long, Wonda Olds, MD   1 tablet at 10/27/20 1010   Current Outpatient Medications  Medication Sig Dispense Refill  . apixaban (ELIQUIS) 5 MG TABS tablet Take 1 tablet (5 mg total) by mouth 2 (two) times daily. 60 tablet 3  . divalproex (DEPAKOTE) 500 MG DR tablet Take 1 tablet (500 mg total) by mouth every 12 (twelve) hours. 60 tablet 1  . DULoxetine (CYMBALTA) 60 MG capsule Take 60 mg by mouth 2 (two) times daily.    . famotidine (PEPCID) 20 MG tablet Take 20 mg by mouth daily as needed for heartburn or indigestion.    . gabapentin (NEURONTIN) 300 MG capsule Take 300 mg by mouth 2 (two) times daily.    . tamsulosin (FLOMAX) 0.4 MG CAPS capsule Take 1 capsule (0.4 mg total) by mouth daily after supper. 30 capsule 0  . traZODone (DESYREL) 50 MG tablet Take 50 mg by mouth at bedtime.    . triamterene-hydrochlorothiazide (MAXZIDE) 75-50 MG tablet Take 1 tablet by mouth daily.    Marland Kitchen aspirin 81 MG EC tablet Take 1 tablet (81 mg total) by mouth daily. Swallow whole. (Patient not taking: No sig reported) 30 tablet 0     Discharge Medications: Please see discharge summary for a list of discharge medications.  Relevant Imaging Results:  Relevant Lab Results:   Additional Information [id number]  Archie Endo, LCSW

## 2020-10-28 NOTE — ED Provider Notes (Signed)
Emergency Medicine Observation Re-evaluation Note  Tim Bradley is a 70 y.o. male, seen on rounds today.  Pt initially presented to the ED for complaints of Extremity Weakness Currently, the patient is awaiting placement to SNF.  I was requested to sign the FL2 form by SW, which has been completed.  Physical Exam  BP 137/81 (BP Location: Left Arm)   Pulse 72   Temp 98.2 F (36.8 C) (Oral)   Resp 16   Ht '6\' 2"'$  (1.88 m)   Wt 92.9 kg   SpO2 98%   BMI 26.30 kg/m  Physical Exam General: calm Cardiac: RRR Lungs: No resp distress Psych: no hallucination, appropriate in response.  ED Course / MDM  EKG:EKG Interpretation  Date/Time:  Thursday October 25 2020 13:37:27 EST Ventricular Rate:  102 PR Interval:  136 QRS Duration: 150 QT Interval:  394 QTC Calculation: 513 R Axis:   -33 Text Interpretation: Sinus tachycardia Right atrial enlargement Left axis deviation Left bundle branch block Abnormal ECG Confirmed by Davonna Belling [phone]) on 10/25/2020 1:45:31 PM  Clinical Course as of 10/28/20 0901  Thu Oct 25, 2020  1457 CBC is normal, EtOH, ASA and APAP levels neg. CMP with CKD at baseline.  [CS]  1459 Depakote is borderline low.  [CS]  [id number] Care of the patient signed out to Dr. Laverta Baltimore, Psych hold and TTS consults put in. Medically cleared.  [CS]    Clinical Course User Index [CS] Truddie Hidden, MD   I have reviewed the labs performed to date as well as medications administered while in observation.  Recent changes in the last 24 hours include: awaiting placement.  Vitals:   10/27/20 1859 10/28/20 0300  BP: 115/85 137/81  Pulse: 66 72  Resp: 17 16  Temp: 98 F (36.7 C) 98.2 F (36.8 C)  SpO2: 97% 98%    Plan  Current plan is for patient to be placed into SNF. FL2 signed by me. PT ordered placed today. Patient is not under full IVC at this time.   Varney Biles, MD 10/28/20 [phone]

## 2020-10-28 NOTE — ED Notes (Signed)
PT at bedside to eval patient

## 2020-10-29 NOTE — ED Notes (Signed)
Pt was incontinent of stool and urine- condom cath placed on pt-- pt states that he cannot tell when he has to go, is able to pull self up. Unable to move legs- denies paralysis, but states he hasn't walked in over a year.

## 2020-10-29 NOTE — ED Notes (Signed)
Breakfast ordered 

## 2020-10-30 NOTE — ED Notes (Signed)
Pt family member at bedside visiting with pt.

## 2020-10-30 NOTE — Progress Notes (Addendum)
11:56am-CSW faxed over [facility] Screening too [name] as requested. CSW was advised by [place] social worker that pt will be reviewed tomorrow at 11am and then CSW will be contacted back. CSW understanding and will continue to follow for further needs.   CSW updated pt and sister at bedside. Sister requested that sister be kept updated with facility choices when given . CSW was asked to send facility choices to sister at [email or web address]. CSW understanding.   CSW and RNCM spoke with pt and sister [name] [phone]) at bedside. Pt was advised of all possible barrier's and challenges to placement as well a steps that would need to occur in order for placement for pt to be successful. Pt expressed that he understood as well as sister [name]) voiced that with the decline that pt has had, she feels that placement/rehab would be best for pt and pt's safety at this time.   CSW was given contact information for [place] worker Danae Chen [phone] ext [phone]) and left voicemail at this time requesting call back at this time to have her  further assist with pt placement needs.    CSW also spoke with APS worker Camillia to give updates on pt's desire to be placed.    Virgie Dad Tonnie Stillman, MSW, LCSW [facility] [place] at [place] [phone]

## 2020-10-30 NOTE — ED Notes (Signed)
Lunch Tray Ordered @ 1034. 

## 2020-10-30 NOTE — ED Notes (Signed)
SNF placement Breakfast order placed

## 2020-10-30 NOTE — Progress Notes (Signed)
CSW noted that pt is awaiting SNF placement. CSW faxed pt out to SNF's at this time.    Virgie Dad Oberia Beaudoin, MSW, LCSW [facility] [place] at [place] [phone]

## 2020-10-30 NOTE — Progress Notes (Signed)
Pt was seen for mobility on RW and assistance to get to side of bed, to sequence all transitions.  He is accompanied by sister, who is very supportive and helpful.  Pt is motivated to try and worked very hard to get control of standing on RW for sidestepping on bed.  His plan is to go to rehab, and then home, with independence needed to succeed at home.  Follow acutely for progressing his standing tolerance, balance and gait with LRAD.  10/30/20 1400  PT Visit Information  Last PT Received On 10/30/20  Assistance Needed +2  History of Present Illness Pt is a 70 y.o. M with significant PMH of diabetes mellitus, seizure, AKI, DVT who presents with weakness.  Subjective Data  Subjective reports he wants to try to walk  Patient Stated Goal to be able to walk again  Precautions  Precautions Fall  Restrictions  Weight Bearing Restrictions No  Pain Assessment  Pain Assessment Faces  Faces Pain Scale 2  Pain Location generalized  Pain Intervention(s) Monitored during session;Repositioned  Cognition  Arousal/Alertness Awake/alert  Behavior During Therapy [facility] for tasks assessed/performed  Overall Cognitive Status Within Functional Limits for tasks assessed  Bed Mobility  Overal bed mobility Needs Assistance  Bed Mobility Supine to Sit;Sit to Supine  Supine to sit Mod assist  Sit to supine Mod assist  General bed mobility comments supported trunk to get up and legs to return to bed  Transfers  Overall transfer level Needs assistance  Equipment used Rolling walker (2 wheeled)  Transfers Sit to/from Stand  Sit to Stand Mod assist;+2 physical assistance;+2 safety/equipment  General transfer comment 2 mod to stand up and support on bari walker for extra support  Ambulation/Gait  Ambulation/Gait assistance Min assist  Gait Distance (Feet) 14 Feet (5+5+4)  Assistive device Rolling walker (2 wheeled);2 person hand held assist  Gait Pattern/deviations Step-to pattern;Decreased stance time -  left;Decreased weight shift to left;Wide base of support  General Gait Details precarious but more stable once up and stretched out  Balance  Overall balance assessment Needs assistance  Sitting-balance support Feet supported  Sitting balance-Leahy Scale Fair  Standing balance support Single extremity supported  Standing balance-Leahy Scale Poor  General Comments  General comments (skin integrity, edema, etc.) Pt is up to walk with help and controlling his balance with RW and cues for safety and assist to maneuver walker on side of bed.  Exercises  Exercises General Lower Extremity  General Exercises - Lower Extremity  Ankle Circles/Pumps AAROM;5 reps  Heel Slides AAROM;10 reps  PT - End of Session  Equipment Utilized During Treatment Gait belt  Activity Tolerance Patient tolerated treatment well  Patient left in bed;with call bell/phone within reach  Nurse Communication Mobility status   PT - Assessment/Plan  PT Plan Current plan remains appropriate  PT Visit Diagnosis History of falling (Z91.81);Unsteadiness on feet (R26.81);Other abnormalities of gait and mobility (R26.89)  PT Frequency (ACUTE ONLY) Min 2X/week  Follow Up Recommendations SNF  PT equipment None recommended by PT  AM-PAC PT "6 Clicks" Mobility Outcome Measure (Version 2)  Help needed turning from your back to your side while in a flat bed without using bedrails? 3  Help needed moving from lying on your back to sitting on the side of a flat bed without using bedrails? 2  Help needed moving to and from a bed to a chair (including a wheelchair)? 2  Help needed standing up from a chair using your arms (e.g., wheelchair  or bedside chair)? 2  Help needed to walk in hospital room? 2  Help needed climbing 3-5 steps with a railing?  2  6 Click Score 13  Consider Recommendation of Discharge To: CIR/SNF/LTACH  PT Goal Progression  Progress towards PT goals Progressing toward goals  PT Time Calculation  PT Start Time  (ACUTE ONLY) 1205  PT Stop Time (ACUTE ONLY) 1234  PT Time Calculation (min) (ACUTE ONLY) 29 min  PT General Charges  $$ ACUTE PT VISIT 1 Visit  PT Treatments  $Gait Training 8-22 mins  $Therapeutic Exercise 8-22 mins    Mee Hives, PT MS Acute Rehab Dept. Number: [place] [facility] [place]

## 2020-10-30 NOTE — ED Notes (Signed)
Dinner Tray Ordered @ 1651. 

## 2020-10-30 NOTE — ED Notes (Signed)
PT at bedside.

## 2020-10-30 NOTE — ED Provider Notes (Signed)
Emergency Medicine Observation Re-evaluation Note  Tim Bradley is a 70 y.o. male, seen on rounds today.  Pt initially presented to the ED for complaints of Extremity Weakness Currently, the patient is resting, Pt ate breakfast, family is present. Pt is holding for placement in snf..  Physical Exam  BP 123/72 (BP Location: Right Arm)   Pulse 70   Temp 98.3 F (36.8 C) (Oral)   Resp 16   Ht '6\' 2"'$  (1.88 m)   Wt 92.9 kg   SpO2 95%   BMI 26.30 kg/m  Physical Exam General: wdwn Cardiac: normal rate Lungs: normal rate Psych:pleasant Lower extremities remain swollen   ED Course / MDM  EKG:EKG Interpretation  Date/Time:  Thursday October 25 2020 13:37:27 EST Ventricular Rate:  102 PR Interval:  136 QRS Duration: 150 QT Interval:  394 QTC Calculation: 513 R Axis:   -33 Text Interpretation: Sinus tachycardia Right atrial enlargement Left axis deviation Left bundle branch block Abnormal ECG Confirmed by Davonna Belling [phone]) on 10/25/2020 1:45:31 PM  Clinical Course as of 10/30/20 0929  Thu Oct 25, 2020  1457 CBC is normal, EtOH, ASA and APAP levels neg. CMP with CKD at baseline.  [CS]  1459 Depakote is borderline low.  [CS]  [id number] Care of the patient signed out to Dr. Laverta Baltimore, Psych hold and TTS consults put in. Medically cleared.  [CS]    Clinical Course User Index [CS] Truddie Hidden, MD   I have reviewed the labs performed to date as well as medications administered while in observation.  Recent changes in the last 24 hours include none  Plan  Current plan is for placement in snf Patient is not under full IVC at this time.   Fransico Meadow, PA-C 10/30/20 [place], [place], DO 10/30/20 [phone]

## 2020-10-30 NOTE — ED Notes (Signed)
Pt up watching tv, pt given diet sprite and grahm crackers.

## 2020-10-30 NOTE — ED Notes (Signed)
Pt noted to be soiled, This RN and NT changed patient.

## 2020-10-31 NOTE — Progress Notes (Addendum)
2:07pm-CSW received call from Jon Gills with [facility] advising CSW That pt has been approved for an indefinite stay at a facility. CSW was advised that [facility] would be sending over [place] contracted SNF's for pt to choose from. From there,  CSW is to reach out to Case Manager with [facility] to give pt's choice so that Case manger can locate/start process for placement for pt. CSW inquired from [facility] on what else CSW would need to do and was advised that at this time there is nothing else. CSW too provide pt with list and also email list to sister as requested.   CSW reached back out pt's [facility] CSW Jacob Moores to gather updates on placement for pt. CSW awaiting email response at this time.     Virgie Dad Jabir Dahlem, MSW, LCSW [facility] [place] at [place] [phone]

## 2020-11-01 NOTE — ED Notes (Signed)
Appetite good at breakfast patient was incont of urine condom came off, patient cleaned up and new condom cath applied. No skin breakdown noted.

## 2020-11-01 NOTE — Progress Notes (Signed)
CSW faxed out referral to [facility]

## 2020-11-01 NOTE — ED Notes (Signed)
Snack given.

## 2020-11-01 NOTE — Progress Notes (Addendum)
10:24am- CSW left contact information with office staff at [facility]. CSW advised that Whitney in admissions would call CSW back. CSW awaits call for placement at this time.   9:41am-CSW reached out to facilities for placement as requested by pt:  [facility] to leave voicemail.   [facility]-left message for admissions however voicemail report that they are not taking new admissions at this time.   [facility] for [facility].   [facility]-left message for call back.    [facility] message for Anderson Malta [phone]  [place] answer  [facility] beds per Marita [place].

## 2020-11-01 NOTE — ED Notes (Signed)
Please call sister [name] @  [phone] for a status update

## 2020-11-01 NOTE — ED Notes (Signed)
Pt had bowel movement and cleaned up. Pt is now resting, Even Resp. No distress noted at this time. Will continue to monitor.

## 2020-11-01 NOTE — ED Notes (Signed)
Dinner tray ordered.

## 2020-11-01 NOTE — ED Notes (Signed)
Breakfast ordered 

## 2020-11-02 NOTE — ED Notes (Addendum)
[name] POA, [name] Goods (also his sister) came to visit pt & states that as his POA she request that pt not speak to Alda Ponder or a guy named Aaron Edelman (unknown last name but is caucasian and has blonde hair) on the phone or have them as visitors. She informed this RN that they are likely to bring pt drugs when given the opportunity.

## 2020-11-02 NOTE — Progress Notes (Signed)
Pt was seen for more gait and exercises, and focused on ROM to ankles due to DF limits and strength at hips.  He is more stable with two person help but is also tired when PT arrived.  Focus on greater time OOB and will benefit from a transition to SNF ASAP.  See for goals of acute PT.  11/02/20 2100  PT Visit Information  Last PT Received On 11/02/20  Assistance Needed +2  History of Present Illness Pt is a 70 y.o. M with significant PMH of diabetes mellitus, seizure, AKI, DVT who presents with weakness.  Subjective Data  Subjective asks to walk, expecting to go to rehab soon  Patient Stated Goal to be able to walk again  Precautions  Precautions Fall  Restrictions  Weight Bearing Restrictions No  Pain Assessment  Pain Assessment Faces  Faces Pain Scale 4  Pain Location knees  Pain Intervention(s) Monitored during session;Repositioned  Cognition  Arousal/Alertness Awake/alert  Behavior During Therapy [facility] for tasks assessed/performed  Overall Cognitive Status Within Functional Limits for tasks assessed  Bed Mobility  Overal bed mobility Needs Assistance  Bed Mobility Supine to Sit;Sit to Supine  Supine to sit Mod assist  Sit to supine Mod assist  Transfers  Overall transfer level Needs assistance  Equipment used Rolling walker (2 wheeled)  Transfers Sit to/from Stand  Sit to Stand Mod assist;Max assist  Ambulation/Gait  Ambulation/Gait assistance Min assist  Gait Distance (Feet) 5 Feet  Assistive device Rolling walker (2 wheeled);2 person hand held assist  Gait Pattern/deviations Step-to pattern;Decreased stance time - left;Decreased weight shift to left;Wide base of support  General Gait Details weak iin the knees  Gait velocity reduced  Balance  Overall balance assessment Needs assistance  Sitting-balance support Feet supported  Sitting balance-Leahy Scale Fair  Standing balance support Bilateral upper extremity supported  Standing balance-Leahy Scale Poor  General  Comments  General comments (skin integrity, edema, etc.) tolerance was low to stand today but did take steps and then worked on LE ex to increas his tolerance to move  Exercises  Exercises General Lower Extremity  General Exercises - Lower Extremity  Ankle Circles/Pumps AAROM  Quad Sets AAROM;10 reps  Heel Slides AAROM;10 reps  Hip ABduction/ADduction AAROM;10 reps  PT - End of Session  Equipment Utilized During Treatment Gait belt  Activity Tolerance Patient tolerated treatment well  Patient left in bed;with call bell/phone within reach;with bed alarm set  Nurse Communication Mobility status   PT - Assessment/Plan  PT Plan Current plan remains appropriate  PT Visit Diagnosis History of falling (Z91.81);Unsteadiness on feet (R26.81);Other abnormalities of gait and mobility (R26.89)  PT Frequency (ACUTE ONLY) Min 2X/week  Follow Up Recommendations SNF  PT equipment None recommended by PT  AM-PAC PT "6 Clicks" Mobility Outcome Measure (Version 2)  Help needed turning from your back to your side while in a flat bed without using bedrails? 3  Help needed moving from lying on your back to sitting on the side of a flat bed without using bedrails? 2  Help needed moving to and from a bed to a chair (including a wheelchair)? 2  Help needed standing up from a chair using your arms (e.g., wheelchair or bedside chair)? 2  Help needed to walk in hospital room? 2  Help needed climbing 3-5 steps with a railing?  2  6 Click Score 13  Consider Recommendation of Discharge To: CIR/SNF/LTACH  PT Goal Progression  Progress towards PT goals Progressing toward goals  PT Time Calculation  PT Start Time (ACUTE ONLY) 1615  PT Stop Time (ACUTE ONLY) 1640  PT Time Calculation (min) (ACUTE ONLY) 25 min  PT General Charges  $$ ACUTE PT VISIT 1 Visit  PT Treatments  $Gait Training 8-22 mins  $Therapeutic Exercise 8-22 mins    Mee Hives, PT MS Acute Rehab Dept. Number: [place] [facility] [place]

## 2020-11-02 NOTE — ED Provider Notes (Signed)
Emergency Medicine Observation Re-evaluation Note  Tim Bradley is a 70 y.o. male, seen on rounds today.  Pt initially presented to the ED for complaints of Extremity Weakness Currently, the patient is sitting up, resting comfortably and watching TV.  He is requesting to know if his sister, Tim Bradley has been contacted.  Physical Exam  BP (!) 151/77 (BP Location: Right Arm)   Pulse 65   Temp 97.7 F (36.5 C) (Oral)   Resp 18   Ht '6\' 2"'$  (1.88 m)   Wt 92.9 kg   SpO2 98%   BMI 26.30 kg/m  Physical Exam General: No acute distress, speaking complete sentences without difficulty.   ED Course / MDM  EKG:EKG Interpretation  Date/Time:  Thursday October 25 2020 13:37:27 EST Ventricular Rate:  102 PR Interval:  136 QRS Duration: 150 QT Interval:  394 QTC Calculation: 513 R Axis:   -33 Text Interpretation: Sinus tachycardia Right atrial enlargement Left axis deviation Left bundle branch block Abnormal ECG Confirmed by Davonna Belling [phone]) on 10/25/2020 1:45:31 PM  Clinical Course as of 11/02/20 1017  Thu Oct 25, 2020  1457 CBC is normal, EtOH, ASA and APAP levels neg. CMP with CKD at baseline.  [CS]  1459 Depakote is borderline low.  [CS]  [id number] Care of the patient signed out to Dr. Laverta Baltimore, Psych hold and TTS consults put in. Medically cleared.  [CS]    Clinical Course User Index [CS] Tim Hidden, MD   I have reviewed the labs performed to date as well as medications administered while in observation.  Recent changes in the last 24 hours include awaiting placement, no longer under IVC and psychiatrically cleared.  Plan  Current plan is for SNF placement.  Chart review shows that his sister was contacted by the social worker and updated. Patient is not under full IVC at this time.    Portions of this note were generated with Lobbyist. Dictation errors may occur despite best attempts at proofreading.    Delia Heady, PA-C 11/02/20 1018    Elnora Morrison, MD 11/02/20 1549

## 2020-11-02 NOTE — Progress Notes (Addendum)
3:01 PM- CSW contacted [name] in admissions at [facility]. CSW left a message requesting a call back  3:04 PM- CSW contacted [facility] and left a message for Anderson Malta [phone]  3:12 PM- CSW contacted [facility] and left a message for Neoma Laming requesting a call back.  3:15 PM- CSW contacted [facility] and spoke with [facility] in admissions. CSW was informed they were taking new patients and CSW was advised to fax over referral for review.   3:31 PM- CSW contacted [facility] in [place]. CSW left a message requesting a call back in regards to referral faxed over   4:01 PM- CSW contacted [facility] and left a message for Audie Clear to follow-up on referral that was sent

## 2020-11-02 NOTE — ED Notes (Signed)
Breakfast Ordered 

## 2020-11-02 NOTE — ED Notes (Signed)
Patient was given A Catheryn Bacon and Coke.

## 2020-11-03 LAB — CBG MONITORING, ED: Glucose-Capillary: 119 mg/dL — ABNORMAL HIGH (ref 70–99)

## 2020-11-03 MED ORDER — BACITRACIN ZINC 500 UNIT/GM EX OINT
TOPICAL_OINTMENT | Freq: Two times a day (BID) | CUTANEOUS | Status: DC
  Administered 2020-11-03 – 2020-11-11 (×12): 1 via TOPICAL
  Filled 2020-11-03 (×12): qty 0.9

## 2020-11-03 NOTE — ED Provider Notes (Signed)
Emergency Medicine Observation Re-evaluation Note  Tim Bradley is a 70 y.o. male, seen on rounds today.  Pt initially presented to the ED for complaints of Extremity Weakness Currently, the patient is sitting up, watching TV.  Physical Exam  BP 128/68   Pulse 72   Temp 98.4 F (36.9 C)   Resp 18   Ht '6\' 2"'$  (1.88 m)   Wt 92.9 kg   SpO2 95%   BMI 26.30 kg/m  Physical Exam Vitals and nursing note reviewed.  Constitutional:      General: He is not in acute distress.    Appearance: He is well-developed and well-nourished. He is not diaphoretic.  HENT:     Head: Atraumatic.  Eyes:     Pupils: Pupils are equal, round, and reactive to light.  Cardiovascular:     Rate and Rhythm: Normal rate and regular rhythm.  Pulmonary:     Effort: Pulmonary effort is normal. No respiratory distress.  Abdominal:     General: There is no distension.     Palpations: Abdomen is soft.  Musculoskeletal:        General: Normal range of motion.     Cervical back: Normal range of motion and neck supple.  Skin:    General: Skin is warm and dry.  Neurological:     Mental Status: He is alert.  Psychiatric:        Mood and Affect: Mood and affect normal.     ED Course / MDM  EKG:EKG Interpretation  Date/Time:  Thursday October 25 2020 13:37:27 EST Ventricular Rate:  102 PR Interval:  136 QRS Duration: 150 QT Interval:  394 QTC Calculation: 513 R Axis:   -33 Text Interpretation: Sinus tachycardia Right atrial enlargement Left axis deviation Left bundle branch block Abnormal ECG Confirmed by Davonna Belling [phone]) on 10/25/2020 1:45:31 PM  Clinical Course as of 11/03/20 0812  Thu Oct 25, 2020  1457 CBC is normal, EtOH, ASA and APAP levels neg. CMP with CKD at baseline.  [CS]  1459 Depakote is borderline low.  [CS]  [id number] Care of the patient signed out to Dr. Laverta Baltimore, Psych hold and TTS consults put in. Medically cleared.  [CS]    Clinical Course User Index [CS] Truddie Hidden, MD   I  have reviewed the labs performed to date as well as medications administered while in observation.  Recent changes in the last 24 hours include none  Plan  Current plan is for boarding for SNF placement Patient is not under full IVC at this time.   Henderly, Britni A, PA-C 11/03/20 [id number]    Malvin Johns, MD 11/03/20 0930

## 2020-11-03 NOTE — ED Notes (Signed)
Pt and his POA, Griselda Miner, had visit per my request from SW to discuss possible placement at [facility] in [place] and that she would like to be contacted and not the pt in terms of placement.  Per her request I messaged the EDP to ask about DM management. Pt is not currently taking any DM medications, however, we will plan to check a cbg before dinner to get a baseline.  Pt noted to have a sore on upper right arm. He is unsure how it happened. Denies any pain or soreness. I will ask EDP to take a look to see if an intervention is needed.

## 2020-11-03 NOTE — ED Notes (Signed)
Pt cbg 119 for a baseline.

## 2020-11-03 NOTE — TOC Progression Note (Addendum)
Transition of Care [facility]) - Progression Note    Patient Details  Name: Tim Bradley MRN: [id number] Date of Birth: [birth year]  Transition of Care [facility]) CM/SW [place], LCSW Phone Number: 11/03/2020, 12:31 PM  Clinical Narrative: CSW notes no current bed offers. CSW is continuing to search for SNF placement for patient.   1p: CSW met with patient and sister and provided update to family on SNF process. CSW noted preference for [facility] near [place] due to proximity to [place]. CSW discussed progress in working on contracted facilities and approval from [place]. CSW will update family as available.      Expected Discharge Plan and Services                                                 Social Determinants of Health (SDOH) Interventions    Readmission Risk Interventions Readmission Risk Prevention Plan 10/01/2020 07/24/2019  Transportation Screening Complete Complete  PCP or Specialist Appt within 3-5 Days - Complete  HRI or [place] - Complete  Social Work Consult for [place] Planning/Counseling - Complete  Palliative Care Screening - Not Applicable  Medication Review Press photographer) Complete Complete  PCP or Specialist appointment within 3-5 days of discharge Complete -  [place] or [place] Complete -  SW Recovery Care/Counseling Consult Complete -  Palliative Care Screening Complete -  [place] Patient Refused -  Some recent data might be hidden

## 2020-11-03 NOTE — ED Notes (Signed)
Patient was given a Media planner and Drink.

## 2020-11-04 NOTE — ED Notes (Signed)
Lunch Tray Ordered @ 1105. 

## 2020-11-04 NOTE — ED Notes (Signed)
Pt cleaned and changed.  Denies pain at this time, does not appear in distress

## 2020-11-04 NOTE — ED Provider Notes (Signed)
Emergency Medicine Observation Re-evaluation Note  Tim Bradley is a 70 y.o. male, seen on rounds today.  Pt initially presented to the ED for complaints of Extremity Weakness Currently, the patient is awaiting nursing home placement  Physical Exam  BP (!) 147/76 (BP Location: Right Arm)   Pulse 83   Temp 99 F (37.2 C) (Oral)   Resp 16   Ht '6\' 2"'$  (1.88 m)   Wt 92.9 kg   SpO2 96%   BMI 26.30 kg/m  Physical Exam General: wdwn Cardiac: normal rate Lungs: clear Psych: normal  ED Course / MDM  EKG:EKG Interpretation  Date/Time:  Thursday October 25 2020 13:37:27 EST Ventricular Rate:  102 PR Interval:  136 QRS Duration: 150 QT Interval:  394 QTC Calculation: 513 R Axis:   -33 Text Interpretation: Sinus tachycardia Right atrial enlargement Left axis deviation Left bundle branch block Abnormal ECG Confirmed by Davonna Belling [phone]) on 10/25/2020 1:45:31 PM  Clinical Course as of 11/04/20 1529  Thu Oct 25, 2020  1457 CBC is normal, EtOH, ASA and APAP levels neg. CMP with CKD at baseline.  [CS]  1459 Depakote is borderline low.  [CS]  [id number] Care of the patient signed out to Dr. Laverta Baltimore, Psych hold and TTS consults put in. Medically cleared.  [CS]    Clinical Course User Index [CS] Truddie Hidden, MD   I have reviewed the labs performed to date as well as medications administered while in observation.  Recent changes in the last 24 hours include none  Plan  Current plan is for SNF placement  Patient is not under full IVC at this time.   Sidney Ace 11/04/20 1530    Blanchie Dessert, MD 11/04/20 2051

## 2020-11-04 NOTE — ED Notes (Signed)
Breakfast Ordered 

## 2020-11-05 NOTE — Progress Notes (Signed)
10:32 AM- CSW was able to speak with Anderson Malta at the [facility] center. Ms. Anderson Malta stated she covers multiple hospitals and works remotely. Ms. Anderson Malta told CSW that [place] and [place] centers currently have a waiting list. CSW was given [facility], [phone] to follow-up

## 2020-11-05 NOTE — ED Notes (Signed)
Breakfast order placed ?

## 2020-11-05 NOTE — Progress Notes (Signed)
CSW spoke with Blain Pais who provided CSW with the [place], [place], and [place] networks to search for placement for patient.

## 2020-11-05 NOTE — ED Notes (Signed)
Family member at the bedside, pt resting comfortable on bed bilateral rails up. Pt cleaned up and clean gown and linens provided by this RN and Otila Kluver, NT.

## 2020-11-05 NOTE — Progress Notes (Signed)
CSW spoke via phone with [name], [name] @ [phone].  ToC team explained that per [name] [facility] Social Worker, Lennox Grumbles, Pt is not eligible to go to [facility] in [place] to to lack of full coverage of costs.   CSW explained new referrals would be sent to lists in [place] area.  ToC team is working to send referrals to more facilities.

## 2020-11-05 NOTE — Progress Notes (Signed)
CSW contacted [facility] and informed they are not accepting referrals at this time.

## 2020-11-05 NOTE — Progress Notes (Signed)
  9:43 AM- CSW contacted Whitney in admissions at [facility] and left a VM requesting a call back. CSW faxed over referral on 11/02/20.

## 2020-11-05 NOTE — Progress Notes (Signed)
CSW spoke with patient about a new list of facilities to send referrals to for placement. CSW let patient know these new facilities are located in [place], [place] area, [place], and [place]. Patient stated that if a bed was offered he would go. Patient stated he is willing to go anywhere besides the place he came from.

## 2020-11-05 NOTE — Progress Notes (Signed)
  10:35 AM- CSW contacted [facility], [phone] and requested a call back.

## 2020-11-05 NOTE — Progress Notes (Signed)
9:49 AM- CSW contacted [facility]. CSW was informed that Johnston Ebbs no longer works for [employer] and [employer] is the head of admissions. Ms. Rene Kocher told CSW that she did not receive a referral in regards to the patient. CSW faxed over a new referral for review. Ms. Rene Kocher told CSW should would give her a call back.

## 2020-11-05 NOTE — Progress Notes (Addendum)
CSW contacted Blain Pais, patients [facility] to see if it was possible to switch to the [facility] to find placement. CSW left a message requesting a call back.

## 2020-11-05 NOTE — Progress Notes (Signed)
CSW contacted [facility] to follow-up with referral. CSW was told to call back in 30 minutes.

## 2020-11-05 NOTE — ED Notes (Signed)
Pt awake, requested sandwich bag and is eating now.

## 2020-11-05 NOTE — ED Notes (Signed)
Pt asleep.

## 2020-11-05 NOTE — ED Provider Notes (Signed)
Emergency Medicine Observation Re-evaluation Note  Tim Bradley is a 70 y.o. male, seen on rounds today.  Pt initially presented to the ED for complaints of Extremity Weakness Currently, the patient is sleeping.  Physical Exam  BP (!) 150/75 (BP Location: Right Arm)   Pulse 69   Temp 98 F (36.7 C) (Oral)   Resp 18   Ht '6\' 2"'$  (1.88 m)   Wt 92.9 kg   SpO2 96%   BMI 26.30 kg/m  Physical Exam General: NAD Cardiac: normal rate Lungs: normal WOB Psych: calm, sleeping  ED Course / MDM  EKG:EKG Interpretation  Date/Time:  Thursday October 25 2020 13:37:27 EST Ventricular Rate:  102 PR Interval:  136 QRS Duration: 150 QT Interval:  394 QTC Calculation: 513 R Axis:   -33 Text Interpretation: Sinus tachycardia Right atrial enlargement Left axis deviation Left bundle branch block Abnormal ECG Confirmed by Tim Bradley [phone]) on 10/25/2020 1:45:31 PM  Clinical Course as of 11/05/20 1648  Thu Oct 25, 2020  1457 CBC is normal, EtOH, ASA and APAP levels neg. CMP with CKD at baseline.  [CS]  1459 Depakote is borderline low.  [CS]  [id number] Care of the patient signed out to Dr. Laverta Bradley, Psych hold and TTS consults put in. Medically cleared.  [CS]    Clinical Course User Index [CS] Tim Hidden, MD   I have reviewed the labs performed to date as well as medications administered while in observation.  Recent changes in the last 24 hours include none.  Plan  Current plan is for SNF placement; social work attempting secure placement. Patient is not under full IVC at this time.   Tim Bradley 11/05/20 1649    Bradley, Tim Overland, MD 11/06/20 1154

## 2020-11-05 NOTE — Progress Notes (Signed)
CSW contacted Marissa Calamity (Admissions, [phone]) with [facility] in [place]. CSW left a message requesting a call back.

## 2020-11-05 NOTE — Progress Notes (Signed)
CSW spoke with [facility] who are currently accepting new referrals. CSW sent referral through fax.

## 2020-11-05 NOTE — ED Notes (Addendum)
Daughter at bedside.

## 2020-11-05 NOTE — Progress Notes (Addendum)
8:44 AM- CSW received a call back from [name] at [facility]. Ms. Tim Bradley left a VM and stated that she would be busy most of the day and would have more time to speak with CSW tomorrow 11/06/2020. CSW contacted Ms. Tim Bradley back and asked if she had some time today to give CSW a call.

## 2020-11-05 NOTE — ED Notes (Signed)
Patient sleeping

## 2020-11-06 NOTE — Progress Notes (Signed)
Pt was too weak to walk today despite standing mutl times, and did report being OOB today.  Possibly fatigued from this effort and will continue to encourage this to get the goals of PT to be met. Follow along and will anticipate more OOB time to chair to be needed.  11/06/20 2100  PT Visit Information  Last PT Received On 11/06/20  Assistance Needed +2  History of Present Illness Pt is a 70 y.o. M with significant PMH of diabetes mellitus, seizure, AKI, DVT who presents with weakness.  Subjective Data  Subjective states he was up in chair earlier  Precautions  Precautions Fall  Restrictions  Weight Bearing Restrictions No  Pain Assessment  Pain Assessment Faces  Pain Score 2  Pain Location knees  Pain Intervention(s) Monitored during session;Repositioned  Cognition  Arousal/Alertness Awake/alert  Behavior During Therapy WFL for tasks assessed/performed  Overall Cognitive Status Within Functional Limits for tasks assessed  Bed Mobility  Overal bed mobility Needs Assistance  Bed Mobility Supine to Sit;Sit to Supine  Supine to sit Mod assist  Sit to supine Mod assist  Transfers  Overall transfer level Needs assistance  Equipment used Rolling walker (2 wheeled)  Transfers Sit to/from Stand  Sit to Stand Max assist  Ambulation/Gait  General Gait Details too fatigued to take a step  Balance  Overall balance assessment Needs assistance  Sitting-balance support Feet supported  Sitting balance-Leahy Scale Good  Standing balance-Leahy Scale Poor  General Comments  General comments (skin integrity, edema, etc.) able to help scoot up the bed but did not take a step today  Exercises  Exercises General Lower Extremity  General Exercises - Lower Extremity  Ankle Circles/Pumps AAROM  Quad Sets AAROM;10 reps  Heel Slides AAROM;10 reps  Hip ABduction/ADduction AAROM;10 reps  PT - End of Session  Equipment Utilized During Treatment Gait belt  Activity Tolerance Patient tolerated  treatment well  Patient left in bed;with call bell/phone within reach;with bed alarm set  Nurse Communication Mobility status   PT - Assessment/Plan  PT Plan Current plan remains appropriate  PT Visit Diagnosis History of falling (Z91.81);Unsteadiness on feet (R26.81);Other abnormalities of gait and mobility (R26.89)  PT Frequency (ACUTE ONLY) Min 2X/week  Follow Up Recommendations SNF  PT equipment None recommended by PT  AM-PAC PT "6 Clicks" Mobility Outcome Measure (Version 2)  Help needed turning from your back to your side while in a flat bed without using bedrails? 3  Help needed moving from lying on your back to sitting on the side of a flat bed without using bedrails? 2  Help needed moving to and from a bed to a chair (including a wheelchair)? 2  Help needed standing up from a chair using your arms (e.g., wheelchair or bedside chair)? 2  Help needed to walk in hospital room? 2  Help needed climbing 3-5 steps with a railing?  1  6 Click Score 12  Consider Recommendation of Discharge To: CIR/SNF/LTACH  PT Goal Progression  Progress towards PT goals Not progressing toward goals - comment  PT Time Calculation  PT Start Time (ACUTE ONLY) 1704  PT Stop Time (ACUTE ONLY) 1749  PT Time Calculation (min) (ACUTE ONLY) 45 min  PT General Charges  $$ ACUTE PT VISIT 1 Visit  PT Treatments  $Therapeutic Exercise 8-22 mins  $Therapeutic Activity 23-37 mins    Mee Hives, PT MS Acute Rehab Dept. Number: [place] and [place]

## 2020-11-06 NOTE — ED Notes (Signed)
Dinner Tray Ordered @ 1649. 

## 2020-11-06 NOTE — Progress Notes (Signed)
CSW contacted [facility] and was informed they are not taking any new referrals due to being on COVID Protocol.

## 2020-11-06 NOTE — ED Notes (Signed)
Lunch Tray Ordered @ 1036. 

## 2020-11-06 NOTE — Progress Notes (Signed)
CSW contacted [facility] to get an update about SNF referral sent yesterday 11/05/20. CSW left a message requesting a call back.

## 2020-11-06 NOTE — Progress Notes (Signed)
CSW contacted [facility] and left a message with admissions. CSW also spoke with [facility] in [place] and faxed over referral for review.

## 2020-11-06 NOTE — Progress Notes (Signed)
CSW contacted [facility] of [place] and was informed they are accepting new patients. CSW was told Lonn Georgia in admissions would give CSW a call back.

## 2020-11-06 NOTE — Progress Notes (Signed)
CSW contacted [facility] in [place], [place] to see if they were accepting any referrals. CSW left a message requesting a call back

## 2020-11-06 NOTE — ED Provider Notes (Signed)
Emergency Medicine Observation Re-evaluation Note  Tim Bradley is a 70 y.o. male, seen on rounds today.  Pt initially presented to the ED for complaints of Extremity Weakness Currently, the patient is sleeping in his hospital bed.  Physical Exam  BP (!) 155/76   Pulse 75   Temp 98.4 F (36.9 C) (Oral)   Resp 16   Ht '6\' 2"'$  (1.88 m)   Wt 92.9 kg   SpO2 94%   BMI 26.30 kg/m  Physical Exam General: NAD Cardiac: normal rate Lungs: no increased work of breathing  Psych: calm, pleasant  ED Course / MDM  EKG:EKG Interpretation  Date/Time:  Thursday October 25 2020 13:37:27 EST Ventricular Rate:  102 PR Interval:  136 QRS Duration: 150 QT Interval:  394 QTC Calculation: 513 R Axis:   -33 Text Interpretation: Sinus tachycardia Right atrial enlargement Left axis deviation Left bundle branch block Abnormal ECG Confirmed by Davonna Belling [phone]) on 10/25/2020 1:45:31 PM  Clinical Course as of 11/06/20 1336  Thu Oct 25, 2020  1457 CBC is normal, EtOH, ASA and APAP levels neg. CMP with CKD at baseline.  [CS]  1459 Depakote is borderline low.  [CS]  [id number] Care of the patient signed out to Dr. Laverta Baltimore, Psych hold and TTS consults put in. Medically cleared.  [CS]    Clinical Course User Index [CS] Truddie Hidden, MD   I have reviewed the labs performed to date as well as medications administered while in observation.  Recent changes in the last 24 hours include none.  Plan  Current plan is for SNF placement. LCSWA in contact with multiple facilities at this time, awaiting responses for placement. Patient is not under full IVC at this time.   [name] 11/06/20 1336    Carmin Muskrat, MD 11/06/20 1616

## 2020-11-06 NOTE — Progress Notes (Signed)
CSW spoke with [facility] who informed CSW they do not have any male beds available.   CSW reached out to [facility] who are currently reviewing patients referral.

## 2020-11-06 NOTE — ED Notes (Signed)
Pt requested extra breakfast tray. Ordered and pt received.

## 2020-11-06 NOTE — Progress Notes (Signed)
CSW contacted [facility] to see if they had any beds this week. CSW left a message with admissions, Kristy Little.

## 2020-11-07 MED ORDER — COVID-19 AD26 VACCINE(JANSSEN) 0.5 ML IM SUSP
0.5000 mL | Freq: Once | INTRAMUSCULAR | Status: AC
  Administered 2020-11-07: 0.5 mL via INTRAMUSCULAR
  Filled 2020-11-07: qty 0.5

## 2020-11-07 NOTE — Progress Notes (Signed)
CSW contacted [facility] to see if they had any beds available this week. CSW was informed admissions [facility] [place] [facility] was on the other line. CSW left a message requesting a call back.

## 2020-11-07 NOTE — ED Notes (Signed)
Dinner Tray Ordered @ 1725. 

## 2020-11-07 NOTE — Progress Notes (Signed)
CSW called [facility] again and was informed there are still no beds but there should be one available next week.

## 2020-11-07 NOTE — Progress Notes (Signed)
[place], [place], [place], [place]  The above facilities have no beds available.  CSW faxed out referral to  [facility], [place]

## 2020-11-07 NOTE — Progress Notes (Signed)
CSW contacted [facility] of [place] and spoke with [place] in admissions. CSW was informed that they are on an admissions freeze due to a COVID outbreak.

## 2020-11-07 NOTE — ED Provider Notes (Signed)
Emergency Medicine Observation Re-evaluation Note  Tim Bradley is a 70 y.o. male, seen on rounds today.  Pt initially presented to the ED for complaints of Weakness Currently, the patient is sitting in chair. Ate his lunch with assistance. Sister visited earlier today.  Physical Exam  BP 131/74 (BP Location: Right Arm)   Pulse 68   Temp 98.3 F (36.8 C) (Oral)   Resp 16   Ht '6\' 2"'$  (1.88 m)   Wt 92.9 kg   SpO2 96%   BMI 26.30 kg/m  Physical Exam General: NAD Cardiac: Normal rate Lungs: Normal work of breathing Psych: Alert.  ED Course / MDM  EKG:EKG Interpretation  Date/Time:  Thursday October 25 2020 13:37:27 EST Ventricular Rate:  102 PR Interval:  136 QRS Duration: 150 QT Interval:  394 QTC Calculation: 513 R Axis:   -33 Text Interpretation: Sinus tachycardia Right atrial enlargement Left axis deviation Left bundle branch block Abnormal ECG Confirmed by Davonna Belling [phone]) on 10/25/2020 1:45:31 PM  Clinical Course as of 11/07/20 1306  Thu Oct 25, 2020  1457 CBC is normal, EtOH, ASA and APAP levels neg. CMP with CKD at baseline.  [CS]  1459 Depakote is borderline low.  [CS]  [id number] Care of the patient signed out to Dr. Laverta Baltimore, Psych hold and TTS consults put in. Medically cleared.  [CS]    Clinical Course User Index [CS] Truddie Hidden, MD   I have reviewed the labs performed to date as well as medications administered while in observation.  Recent changes in the last 24 hours include none.  Plan  Current plan is for SNF placement. Patient is not under full IVC at this time.   Plan is for placement although having difficulty as patient has history of cocaine abuse and reported auditory hallucinations on ED arrival. He is denying history of hallucinations and none currently.  Social work placed order for patient to be vaccinated with J&J. He and sister is POA also consented. Social work will continue to work on placement. Patient will likely need covid test  prior to placement, however will hold off on repeating test now as there are not pending placements currently. He was covid negative on 10/25/20.   Barrie Folk, PA-C 11/07/20 1504    Blanchie Dessert, MD 11/12/20 1715

## 2020-11-07 NOTE — Progress Notes (Signed)
CSW received a call back from [facility]. Ms. April from admissions was willing to review patient. Ms. April asked CSW to verify if patient is vaccinated. Patient will not be accepted if he is not vaccinated. Fax number [phone].

## 2020-11-07 NOTE — Care Management (Signed)
Patient needs Covid vaccine for placement, patient is agreeable. [place] Covid-19 Vaccine Homebound Task Force contacted. Murvin Natal RN on team made [place] aware that they are no longer doing inpatient. The process is the physician can order and it can be given in the ED. Updated K. Walisiewiicz PA-C [place] [facility] [place] was ordered to be given.

## 2020-11-07 NOTE — ED Notes (Signed)
RN has reviewed POA papers and placed copy in chart; Patient's sister is POA  but patient does NOT have legal guardian at this time and able to make his own decisions; pt is A&Ox 4 and does not demonstrate any lack in mental status at this time that would warrant need for guardianship-Monique,RN

## 2020-11-07 NOTE — Progress Notes (Signed)
CSW contacted [facility] in [place], [place]. CSW was informed they are accepting new referrals but they have no beds available at this time

## 2020-11-07 NOTE — Progress Notes (Signed)
CSW and RN Rosendo Gros put in request for [employer] vaccine. Dr. Maryan Rued was made aware.

## 2020-11-07 NOTE — Progress Notes (Signed)
CSW spoke with patients sister [name] Goods to find out if her brother was vaccinated. Ms. Aleene Davidson stated no and she has talked to her brother about that. Ms. Aleene Davidson also informed CSW that she is his power of attorney and legal guardian. Ms. Aleene Davidson stated she plans on bringing that paperwork to the hospital so copies can be made to patients file. CSW also updated Ms. Goods about the progress of finding placement. Ms. Aleene Davidson stated she plans on coming to the hospital around 11:30 today.

## 2020-11-07 NOTE — Progress Notes (Signed)
CSW contacted [name] at [facility] in [place]. CSW left an voicemail requesting a call back

## 2020-11-07 NOTE — ED Notes (Signed)
Lunch Tray Ordered @ 1046. 

## 2020-11-07 NOTE — Progress Notes (Signed)
CSW spoke with patient and his sister. CSW informed them that there was a placement in [place] willing to look at patient for admission. CSW informed patient that they require the COVID vaccine. CSW asked patient if that was something he was interested in. Patient agreed and stated he would take the vaccine to help him get into a facility. Patients sister also brought her POA paperwork to scan in patient chart.

## 2020-11-07 NOTE — Progress Notes (Signed)
CSW contacted [facility] and was informed patient was declined for patient. The clinical team was concerned for patients drug usage and hearing voices at admission. CSW explained that the patient does not use drugs regularly and there have been no concerns with him hearing voices. CSW was informed there are no male beds at this time.

## 2020-11-07 NOTE — ED Notes (Signed)
Breakfast order placed ?

## 2020-11-07 NOTE — ED Notes (Signed)
Pts sister Tim Bradley here to visit with patient and speak with Education officer, museum. Mrs. Tim Bradley is patients legal guardian and POA.  Copy of POA paperwork placed on patient's chart.

## 2020-11-08 LAB — RESP PANEL BY RT-PCR (FLU A&B, COVID) ARPGX2
Influenza A by PCR: NEGATIVE
Influenza B by PCR: NEGATIVE
SARS Coronavirus 2 by RT PCR: NEGATIVE

## 2020-11-08 LAB — BASIC METABOLIC PANEL
Anion gap: 8 (ref 5–15)
BUN: 53 mg/dL — ABNORMAL HIGH (ref 8–23)
CO2: 26 mmol/L (ref 22–32)
Calcium: 8.7 mg/dL — ABNORMAL LOW (ref 8.9–10.3)
Chloride: 106 mmol/L (ref 98–111)
Creatinine, Ser: 2.57 mg/dL — ABNORMAL HIGH (ref 0.61–1.24)
GFR, Estimated: 26 mL/min — ABNORMAL LOW (ref >60)
Glucose, Bld: 123 mg/dL — ABNORMAL HIGH (ref 70–99)
Potassium: 4.4 mmol/L (ref 3.5–5.1)
Sodium: 140 mmol/L (ref 135–145)

## 2020-11-08 LAB — VALPROIC ACID LEVEL: Valproic Acid Lvl: 44 ug/mL — ABNORMAL LOW (ref 50.0–100.0)

## 2020-11-08 NOTE — ED Notes (Signed)
PT at bedside.

## 2020-11-08 NOTE — ED Notes (Signed)
Sandwich bag and drink provided to patient.

## 2020-11-08 NOTE — Progress Notes (Signed)
CSW received a call from [facility] in [place], [place] spoke with Tim Bradley was informed there are no beds available. CSW asked when would there be a good time to call back about bed availability. CSW was told they are at their max and to call back March of 2022.

## 2020-11-08 NOTE — ED Notes (Signed)
Breakfast Ordered 

## 2020-11-08 NOTE — Progress Notes (Signed)
CSW contacted [facility] in [place]. CSW was told by admissions that patient looks like someone they could take. Admissions requires a PCR Covid test for placement. Admissions also is going to contact [facility] to get additional information. Admissions stated they would contact CSW back.

## 2020-11-08 NOTE — Progress Notes (Signed)
Patient is now vaccinated and CSW was able to send a referral to [facility] for review. CSW left a message for April in admissions.

## 2020-11-08 NOTE — ED Notes (Signed)
Visitor at bedside.

## 2020-11-08 NOTE — Progress Notes (Signed)
Physical Therapy Treatment Patient Details Name: Tim Bradley MRN: [id number] DOB: 1951-08-15 Today's Date: 11/08/2020    History of Present Illness Pt is a 70 y.o. M with significant PMH of diabetes mellitus, seizure, AKI, DVT who presents with weakness.    PT Comments    Pt was seen for progression of mobility and noted that he was up to side of bed with less help, and then was able to stand and walk again today.  He is motivated and getting better physical control of standing and LE movement.  Following along with him to get increased gait distances, to improve the launch of sit to stand and to improve distribution of WB on BLE's.  He is better able to support on LLE, so will continue to improve that skill.   Follow Up Recommendations  SNF     Equipment Recommendations  None recommended by PT    Recommendations for Other Services       Precautions / Restrictions Precautions Precautions: Fall Restrictions Weight Bearing Restrictions: No    Mobility  Bed Mobility Overal bed mobility: Needs Assistance Bed Mobility: Supine to Sit;Sit to Supine     Supine to sit: Min assist;Mod assist Sit to supine: Min assist;Mod assist        Transfers Overall transfer level: Needs assistance Equipment used: Rolling walker (2 wheeled) Transfers: Sit to/from Stand Sit to Stand: Mod assist;Max assist         General transfer comment: mod to max to stand with cues for hand placement  Ambulation/Gait Ambulation/Gait assistance: Min assist Gait Distance (Feet): 6 Feet Assistive device: Rolling walker (2 wheeled);2 person hand held assist Gait Pattern/deviations: Step-to pattern;Decreased stance time - left;Decreased weight shift to left;Wide base of support Gait velocity: reduced Gait velocity interpretation: <1.8 ft/sec, indicate of risk for recurrent falls     Stairs             Wheelchair Mobility    Modified Rankin (Stroke Patients Only)       Balance  Overall balance assessment: Needs assistance Sitting-balance support: Feet supported Sitting balance-Leahy Scale: Good     Standing balance support: Bilateral upper extremity supported   Standing balance comment: better able to correct his posture today                            Cognition Arousal/Alertness: Awake/alert Behavior During Therapy: WFL for tasks assessed/performed Overall Cognitive Status: Within Functional Limits for tasks assessed                                        Exercises General Exercises - Lower Extremity Ankle Circles/Pumps: AAROM Quad Sets: AAROM;10 reps Heel Slides: AAROM;10 reps Hip ABduction/ADduction: AAROM;10 reps    General Comments General comments (skin integrity, edema, etc.): pt was more able to assist scooting out to EOB with less help, moved his legs with only min assist to finish scooting LLE      Pertinent Vitals/Pain Pain Assessment: Faces Pain Score: 2  Pain Location: L knee Pain Intervention(s): Limited activity within patient's tolerance;Monitored during session;Repositioned    Home Living                      Prior Function            PT Goals (current goals can now be found in  the care plan section) Progress towards PT goals: Progressing toward goals    Frequency    Min 2X/week      PT Plan Current plan remains appropriate    Co-evaluation              AM-PAC PT "6 Clicks" Mobility   Outcome Measure  Help needed turning from your back to your side while in a flat bed without using bedrails?: A Little Help needed moving from lying on your back to sitting on the side of a flat bed without using bedrails?: A Lot Help needed moving to and from a bed to a chair (including a wheelchair)?: A Lot Help needed standing up from a chair using your arms (e.g., wheelchair or bedside chair)?: A Lot Help needed to walk in hospital room?: A Little Help needed climbing 3-5 steps with a  railing? : Total 6 Click Score: 13    End of Session Equipment Utilized During Treatment: Gait belt Activity Tolerance: Patient tolerated treatment well Patient left: in bed;with call bell/phone within reach;with bed alarm set Nurse Communication: Mobility status PT Visit Diagnosis: History of falling (Z91.81);Unsteadiness on feet (R26.81);Other abnormalities of gait and mobility (R26.89)     Time: 1455-1535 PT Time Calculation (min) (ACUTE ONLY): 40 min  Charges:  $Gait Training: 8-22 mins $Therapeutic Exercise: 8-22 mins $Therapeutic Activity: 8-22 mins           Ramond Dial 11/08/2020, 9:10 PM   Mee Hives, PT MS Acute Rehab Dept. Number: [place]
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
## 2020-11-08 NOTE — ED Notes (Signed)
Snack and drink was given to patient.

## 2020-11-09 NOTE — ED Notes (Signed)
Lunch Tray Ordered @ 1030. 

## 2020-11-09 NOTE — Progress Notes (Signed)
CSW spoke with Janie Morning Admissions director at [facility] who accepted patient for placement. Ms. Kirk Ruths stated that Monday before 3 PM would be a good time for patient to come to the facility. CSW stated she will get everything ready on Monday.

## 2020-11-09 NOTE — ED Notes (Signed)
Dialed number for [facility] as requested by pt. Pt speaking on phone now

## 2020-11-09 NOTE — Progress Notes (Signed)
CSW contacted Blain Pais, patients [facility] social worker at [phone] EXT [phone]. CSW was following up to see if [facility] had contacted her about placement. CSW left a message requesting a call back.

## 2020-11-09 NOTE — Discharge Planning (Signed)
Licensed Clinical Social Worker is seeking post-discharge placement for this patient at the following level of care: SNF    

## 2020-11-09 NOTE — ED Notes (Signed)
[name] at bedside.

## 2020-11-09 NOTE — ED Notes (Signed)
Dinner tray provided

## 2020-11-09 NOTE — ED Notes (Signed)
Dinner Tray Ordered @ 1710.

## 2020-11-09 NOTE — Progress Notes (Signed)
CSW contacted patients sister to inform her that patient has been accepted at [facility].

## 2020-11-09 NOTE — ED Provider Notes (Signed)
70 year old male past medical history of hypertension, CKD, CHF, DVT is currently boarding in the ED secondary to unstable living situation.  He presented initially 2 weeks ago for inability to care for himself.  There was also some concern about his unstable living condition.  Patient has been boarding in the ED waiting placement.  Patient had [employer] Covid vaccine 2 days ago.  He does have some mild bump in his creatinine but it has been stable.  Reevaluation today.  Patient is resting comfortably.  No complaints.  He has been eating appropriately without any difficulty.  Benign abdominal exam.  No evidence of respiratory distress.  Social work is working on placement.  They have identified [facility] as potential placement option.  They are pending confirmation.   Volanda Napoleon, PA-C 11/09/20 0911    Wyvonnia Dusky, MD 11/09/20 [phone]

## 2020-11-09 NOTE — Progress Notes (Signed)
CSW received a call back from Tim Bradley. Ms. Tim Bradley stated that she has not heard from [facility] yet. CSW gave Ms. Tim Bradley their contact information so she could reach out. Ms. Tim Bradley stated that she would give CSW a call back if their are any problems.

## 2020-11-09 NOTE — ED Notes (Signed)
Pt sleeping without distress. Resp even and non-labored. Will continue to monitor.

## 2020-11-09 NOTE — ED Notes (Signed)
Patient was given Snacks and Drink.

## 2020-11-09 NOTE — ED Notes (Signed)
Patient provided staff cell phone to call family-Monique,RN

## 2020-11-09 NOTE — Progress Notes (Signed)
CSW contacted patients sister, [name]. CSW left a message requesting a call back.

## 2020-11-09 NOTE — ED Notes (Signed)
Breakfast ordered 

## 2020-11-10 NOTE — ED Notes (Signed)
Patient had questions regarding POA; Patient provided copy of POA papers he signed on 10/30/2020; Pt requesting to speak with Social worker in regards to his rights and what he signed over to family member; Pt remains A&Ox 4 and had no signs of mental decline at this time; RN will have request of Social work in the [place]

## 2020-11-10 NOTE — ED Provider Notes (Signed)
Emergency Medicine Observation Re-evaluation Note  PIKE AVELLA is a 70 y.o. male, seen on rounds today.  Pt initially presented to the ED for complaints of Weakness Currently, the patient is resting.  Physical Exam  BP 136/78   Pulse 80   Temp 97.8 F (36.6 C) (Oral)   Resp 16   Ht '6\' 2"'$  (1.88 m)   Wt 92.9 kg   SpO2 93%   BMI 26.30 kg/m  Physical Exam General: resting Cardiac: warm and well perfused Lungs: even unlabored Psych: calm  ED Course / MDM  EKG:EKG Interpretation  Date/Time:  Thursday October 25 2020 13:37:27 EST Ventricular Rate:  102 PR Interval:  136 QRS Duration: 150 QT Interval:  394 QTC Calculation: 513 R Axis:   -33 Text Interpretation: Sinus tachycardia Right atrial enlargement Left axis deviation Left bundle branch block Abnormal ECG Confirmed by Davonna Belling [phone]) on 10/25/2020 1:45:31 PM  Clinical Course as of 11/10/20 1947  Thu Oct 25, 2020  1457 CBC is normal, EtOH, ASA and APAP levels neg. CMP with CKD at baseline.  [CS]  1459 Depakote is borderline low.  [CS]  [id number] Care of the patient signed out to Dr. Laverta Baltimore, Psych hold and TTS consults put in. Medically cleared.  [CS]    Clinical Course User Index [CS] Truddie Hidden, MD   I have reviewed the labs performed to date as well as medications administered while in observation.  Recent changes in the last 24 hours include yesterday accepted at [facility] center.  Plan  Current plan is for placement per [facility]; looks like accepted at [facility], send on Monday. Patient is not under full IVC at this time.   Lucrezia Starch, MD 11/10/20 1950

## 2020-11-11 NOTE — ED Notes (Signed)
Breakfast Ordered 

## 2020-11-11 NOTE — ED Notes (Signed)
Dinner tray delivered.

## 2020-11-11 NOTE — ED Provider Notes (Signed)
Emergency Medicine Observation Re-evaluation Note  Tim Bradley is a 70 y.o. male, seen on rounds today.  Pt initially presented to the ED for complaints of Weakness Currently, the patient is waiting on SNF placement.  Physical Exam  BP (!) 164/86 (BP Location: Right Arm)   Pulse 85   Temp 97.9 F (36.6 C) (Oral)   Resp 17   Ht '6\' 2"'$  (1.88 m)   Wt 92.9 kg   SpO2 96%   BMI 26.30 kg/m  Physical Exam General: resting Cardiac: warm and well perfused Lungs: even unlabored Psych: calm  ED Course / MDM  EKG:EKG Interpretation  Date/Time:  Thursday October 25 2020 13:37:27 EST Ventricular Rate:  102 PR Interval:  136 QRS Duration: 150 QT Interval:  394 QTC Calculation: 513 R Axis:   -33 Text Interpretation: Sinus tachycardia Right atrial enlargement Left axis deviation Left bundle branch block Abnormal ECG Confirmed by Davonna Belling [phone]) on 10/25/2020 1:45:31 PM  Clinical Course as of 11/11/20 1534  Thu Oct 25, 2020  1457 CBC is normal, EtOH, ASA and APAP levels neg. CMP with CKD at baseline.  [CS]  1459 Depakote is borderline low.  [CS]  [id number] Care of the patient signed out to Dr. Laverta Baltimore, Psych hold and TTS consults put in. Medically cleared.  [CS]    Clinical Course User Index [CS] Truddie Hidden, MD   I have reviewed the labs performed to date as well as medications administered while in observation.  Recent changes in the last 24 hours include no major events, still waiting on placement.  Plan  Current plan is for SNF placement, hopefully Monday per review of SW notes. Patient is not under full IVC at this time.   Lucrezia Starch, MD 11/11/20 1536

## 2020-11-11 NOTE — ED Notes (Signed)
Pt ate his lunch tray w/no complaints. Appetite has been good.

## 2020-11-11 NOTE — ED Notes (Signed)
Lunch tray delivered.

## 2020-11-12 NOTE — ED Notes (Signed)
Pt cleaned or urine and clean brief placed on patient. Pt requesting to sit up to eat breakfast. Pt sitting on the edge of the bed eating breakfast with no assistance.

## 2020-11-12 NOTE — Progress Notes (Signed)
Discharge summary was faxed to [facility]. CSW spoke with transportation and they will not be available until 3:45 PM to pick patient up.

## 2020-11-12 NOTE — ED Notes (Signed)
Patient was given a Ginger Ale and [employer].

## 2020-11-12 NOTE — ED Provider Notes (Signed)
Emergency Medicine Observation Re-evaluation Note  Tim Bradley is a 70 y.o. male, seen on rounds today.  Pt initially presented to the ED for complaints of Weakness Currently, the patient is fiddling with the television remote.  Physical Exam  BP (!) 149/90 (BP Location: Right Arm)   Pulse 76   Temp 97.8 F (36.6 C) (Oral)   Resp 18   Ht '6\' 2"'$  (1.88 m)   Wt 92.9 kg   SpO2 98%   BMI 26.30 kg/m  Physical Exam General: Nontoxic Cardiac: Normal heart rate Lungs: Normal respiratory rate Psych: Calm  ED Course / MDM  EKG:EKG Interpretation  Date/Time:  Thursday October 25 2020 13:37:27 EST Ventricular Rate:  102 PR Interval:  136 QRS Duration: 150 QT Interval:  394 QTC Calculation: 513 R Axis:   -33 Text Interpretation: Sinus tachycardia Right atrial enlargement Left axis deviation Left bundle branch block Abnormal ECG Confirmed by Davonna Belling [phone]) on 10/25/2020 1:45:31 PM  Clinical Course as of 11/12/20 0833  Thu Oct 25, 2020  1457 CBC is normal, EtOH, ASA and APAP levels neg. CMP with CKD at baseline.  [CS]  1459 Depakote is borderline low.  [CS]  [id number] Care of the patient signed out to Dr. Laverta Baltimore, Psych hold and TTS consults put in. Medically cleared.  [CS]    Clinical Course User Index [CS] Truddie Hidden, MD   I have reviewed the labs performed to date as well as medications administered while in observation.  Recent changes in the last 24 hours include none.  Plan  Current plan is for placement in SNF. Patient is not under full IVC at this time.   Daleen Bo, MD 11/12/20 [phone]

## 2020-11-12 NOTE — Progress Notes (Signed)
Physical Therapy Treatment Patient Details Name: Tim Bradley MRN: [id number] DOB: 02-Oct-1950 Today's Date: 11/12/2020    History of Present Illness Pt is a 70 y.o. M with significant PMH of diabetes mellitus, seizure, AKI, DVT who presents with weakness.    PT Comments    Pt was seen for mobility on RW with both standing and walking skills.  He is demonstrating more control of standing skills, more ability to distribute wgt between BLE's and more effort to maintain longer standing time.  His plan is to discharge to SNF this afternoon, and will continue therapy as new POC dictates.  Follow acutely as his stay permits.   Follow Up Recommendations  SNF     Equipment Recommendations  None recommended by PT    Recommendations for Other Services       Precautions / Restrictions Precautions Precautions: Fall Restrictions Weight Bearing Restrictions: No    Mobility  Bed Mobility Overal bed mobility: Needs Assistance Bed Mobility: Supine to Sit;Sit to Supine     Supine to sit: Min assist Sit to supine: Min assist        Transfers Overall transfer level: Needs assistance Equipment used: Rolling walker (2 wheeled) Transfers: Sit to/from Stand Sit to Stand: Mod assist         General transfer comment: mod to max to stand with cues for hand placement  Ambulation/Gait Ambulation/Gait assistance: Min assist Gait Distance (Feet): 5 Feet Assistive device: Rolling walker (2 wheeled) Gait Pattern/deviations: Step-to pattern;Decreased stance time - left;Decreased weight shift to left;Wide base of support Gait velocity: reduced Gait velocity interpretation: <1.8 ft/sec, indicate of risk for recurrent falls     Stairs             Wheelchair Mobility    Modified Rankin (Stroke Patients Only)       Balance Overall balance assessment: Needs assistance Sitting-balance support: Feet supported Sitting balance-Leahy Scale: Good     Standing balance support:  Bilateral upper extremity supported Standing balance-Leahy Scale: Poor Standing balance comment: stood for 5 minutes with verbal and tactile cues for posture                            Cognition Arousal/Alertness: Awake/alert Behavior During Therapy: WFL for tasks assessed/performed Overall Cognitive Status: Within Functional Limits for tasks assessed                                        Exercises      General Comments General comments (skin integrity, edema, etc.): Stood to use urinal with nursing, very capable and took sidesteps after resting      Pertinent Vitals/Pain Pain Assessment: Faces Faces Pain Scale: Hurts a little bit Pain Location: L knee    Home Living                      Prior Function            PT Goals (current goals can now be found in the care plan section) Progress towards PT goals: Progressing toward goals    Frequency    Min 2X/week      PT Plan Current plan remains appropriate    Co-evaluation              AM-PAC PT "6 Clicks" Mobility   Outcome Measure  Help needed  turning from your back to your side while in a flat bed without using bedrails?: A Little Help needed moving from lying on your back to sitting on the side of a flat bed without using bedrails?: A Little Help needed moving to and from a bed to a chair (including a wheelchair)?: A Lot Help needed standing up from a chair using your arms (e.g., wheelchair or bedside chair)?: A Lot Help needed to walk in hospital room?: A Little Help needed climbing 3-5 steps with a railing? : Total 6 Click Score: 14    End of Session Equipment Utilized During Treatment: Gait belt Activity Tolerance: Patient tolerated treatment well Patient left: in bed;with call bell/phone within reach;with bed alarm set Nurse Communication: Mobility status PT Visit Diagnosis: History of falling (Z91.81);Unsteadiness on feet (R26.81);Other abnormalities of gait  and mobility (R26.89)     Time: [id number] PT Time Calculation (min) (ACUTE ONLY): 33 min  Charges:  $Gait Training: 8-22 mins $Therapeutic Activity: 8-22 mins                  Ramond Dial 11/12/2020, 2:13 PM  Mee Hives, PT MS Acute Rehab Dept. Number: [place] and [place]

## 2020-11-12 NOTE — ED Notes (Signed)
Pt cleaned and brief replaced.  Pt to [facility] via transport service.  Pt placed in wheelchair by staff x3.  Pt A&Ox4, respirations equal and unlabored.  No distress noted. No complaints or questions at this time.  AVS given to SW to give to transport.

## 2020-11-12 NOTE — Discharge Instructions (Addendum)
Follow-up with your doctor as needed for problems. 

## 2021-12-21 ENCOUNTER — Emergency Department (HOSPITAL_COMMUNITY): Payer: No Typology Code available for payment source

## 2021-12-21 ENCOUNTER — Inpatient Hospital Stay (HOSPITAL_COMMUNITY): Payer: No Typology Code available for payment source

## 2021-12-21 ENCOUNTER — Encounter (HOSPITAL_COMMUNITY): Payer: Self-pay | Admitting: *Deleted

## 2021-12-21 ENCOUNTER — Other Ambulatory Visit: Payer: Self-pay

## 2021-12-21 ENCOUNTER — Inpatient Hospital Stay (HOSPITAL_COMMUNITY)
Admission: EM | Admit: 2021-12-21 | Discharge: 2021-12-24 | DRG: 291 | Disposition: A | Discharge status: Home Health | Payer: No Typology Code available for payment source | Attending: Internal Medicine | Admitting: Internal Medicine

## 2021-12-21 DIAGNOSIS — E66811 Obesity, class 1: Secondary | ICD-10-CM | POA: Diagnosis present

## 2021-12-21 DIAGNOSIS — F17210 Nicotine dependence, cigarettes, uncomplicated: Secondary | ICD-10-CM | POA: Diagnosis present

## 2021-12-21 DIAGNOSIS — N400 Enlarged prostate without lower urinary tract symptoms: Secondary | ICD-10-CM | POA: Diagnosis present

## 2021-12-21 DIAGNOSIS — F1420 Cocaine dependence, uncomplicated: Secondary | ICD-10-CM | POA: Diagnosis present

## 2021-12-21 DIAGNOSIS — R7989 Other specified abnormal findings of blood chemistry: Secondary | ICD-10-CM | POA: Diagnosis present

## 2021-12-21 DIAGNOSIS — G40909 Epilepsy, unspecified, not intractable, without status epilepticus: Secondary | ICD-10-CM | POA: Diagnosis present

## 2021-12-21 DIAGNOSIS — Z79899 Other long term (current) drug therapy: Secondary | ICD-10-CM | POA: Diagnosis not present

## 2021-12-21 DIAGNOSIS — I82531 Chronic embolism and thrombosis of right popliteal vein: Secondary | ICD-10-CM | POA: Diagnosis present

## 2021-12-21 DIAGNOSIS — E669 Obesity, unspecified: Secondary | ICD-10-CM | POA: Diagnosis present

## 2021-12-21 DIAGNOSIS — Z6832 Body mass index (BMI) 32.0-32.9, adult: Secondary | ICD-10-CM

## 2021-12-21 DIAGNOSIS — E1169 Type 2 diabetes mellitus with other specified complication: Secondary | ICD-10-CM | POA: Diagnosis present

## 2021-12-21 DIAGNOSIS — Z8249 Family history of ischemic heart disease and other diseases of the circulatory system: Secondary | ICD-10-CM

## 2021-12-21 DIAGNOSIS — Z888 Allergy status to other drugs, medicaments and biological substances status: Secondary | ICD-10-CM

## 2021-12-21 DIAGNOSIS — K219 Gastro-esophageal reflux disease without esophagitis: Secondary | ICD-10-CM | POA: Diagnosis present

## 2021-12-21 DIAGNOSIS — E785 Hyperlipidemia, unspecified: Secondary | ICD-10-CM | POA: Diagnosis present

## 2021-12-21 DIAGNOSIS — I825Y1 Chronic embolism and thrombosis of unspecified deep veins of right proximal lower extremity: Secondary | ICD-10-CM | POA: Diagnosis not present

## 2021-12-21 DIAGNOSIS — N179 Acute kidney failure, unspecified: Secondary | ICD-10-CM | POA: Diagnosis present

## 2021-12-21 DIAGNOSIS — Z20822 Contact with and (suspected) exposure to covid-19: Secondary | ICD-10-CM | POA: Diagnosis present

## 2021-12-21 DIAGNOSIS — E1122 Type 2 diabetes mellitus with diabetic chronic kidney disease: Secondary | ICD-10-CM | POA: Diagnosis present

## 2021-12-21 DIAGNOSIS — I5043 Acute on chronic combined systolic (congestive) and diastolic (congestive) heart failure: Secondary | ICD-10-CM | POA: Diagnosis present

## 2021-12-21 DIAGNOSIS — N184 Chronic kidney disease, stage 4 (severe): Secondary | ICD-10-CM | POA: Diagnosis present

## 2021-12-21 DIAGNOSIS — I82409 Acute embolism and thrombosis of unspecified deep veins of unspecified lower extremity: Secondary | ICD-10-CM | POA: Diagnosis present

## 2021-12-21 DIAGNOSIS — Z66 Do not resuscitate: Secondary | ICD-10-CM | POA: Diagnosis present

## 2021-12-21 DIAGNOSIS — Z7901 Long term (current) use of anticoagulants: Secondary | ICD-10-CM | POA: Diagnosis not present

## 2021-12-21 DIAGNOSIS — R0602 Shortness of breath: Secondary | ICD-10-CM | POA: Diagnosis present

## 2021-12-21 DIAGNOSIS — I5023 Acute on chronic systolic (congestive) heart failure: Secondary | ICD-10-CM | POA: Diagnosis present

## 2021-12-21 DIAGNOSIS — R9431 Abnormal electrocardiogram [ECG] [EKG]: Secondary | ICD-10-CM | POA: Diagnosis present

## 2021-12-21 DIAGNOSIS — I130 Hypertensive heart and chronic kidney disease with heart failure and stage 1 through stage 4 chronic kidney disease, or unspecified chronic kidney disease: Secondary | ICD-10-CM | POA: Diagnosis present

## 2021-12-21 DIAGNOSIS — I82511 Chronic embolism and thrombosis of right femoral vein: Secondary | ICD-10-CM | POA: Diagnosis present

## 2021-12-21 DIAGNOSIS — I10 Essential (primary) hypertension: Secondary | ICD-10-CM | POA: Diagnosis not present

## 2021-12-21 DIAGNOSIS — E119 Type 2 diabetes mellitus without complications: Secondary | ICD-10-CM

## 2021-12-21 DIAGNOSIS — M7989 Other specified soft tissue disorders: Secondary | ICD-10-CM | POA: Diagnosis not present

## 2021-12-21 DIAGNOSIS — N189 Chronic kidney disease, unspecified: Secondary | ICD-10-CM | POA: Diagnosis not present

## 2021-12-21 LAB — CBG MONITORING, ED
Glucose-Capillary: 197 mg/dL — ABNORMAL HIGH (ref 70–99)
Glucose-Capillary: 174 mg/dL — ABNORMAL HIGH (ref 70–99)

## 2021-12-21 LAB — CBC WITH DIFFERENTIAL/PLATELET
Abs Immature Granulocytes: 0.03 10*3/uL (ref 0.00–0.07)
Basophils Absolute: 0 10*3/uL (ref 0.0–0.1)
Basophils Relative: 1 %
Eosinophils Absolute: 0.2 10*3/uL (ref 0.0–0.5)
Eosinophils Relative: 3 %
HCT: 36.3 % — ABNORMAL LOW (ref 39.0–52.0)
Hemoglobin: 11.4 g/dL — ABNORMAL LOW (ref 13.0–17.0)
Immature Granulocytes: 1 %
Lymphocytes Relative: 40 %
Lymphs Abs: 2.3 10*3/uL (ref 0.7–4.0)
MCH: 29.1 pg (ref 26.0–34.0)
MCHC: 31.4 g/dL (ref 30.0–36.0)
MCV: 92.6 fL (ref 80.0–100.0)
Monocytes Absolute: 0.7 10*3/uL (ref 0.1–1.0)
Monocytes Relative: 12 %
Neutro Abs: 2.5 10*3/uL (ref 1.7–7.7)
Neutrophils Relative %: 43 %
Platelets: 239 10*3/uL (ref 150–400)
RBC: 3.92 MIL/uL — ABNORMAL LOW (ref 4.22–5.81)
RDW: 14.2 % (ref 11.5–15.5)
WBC: 5.7 10*3/uL (ref 4.0–10.5)
nRBC: 0 % (ref 0.0–0.2)

## 2021-12-21 LAB — BASIC METABOLIC PANEL
Anion gap: 11 (ref 5–15)
BUN: 64 mg/dL — ABNORMAL HIGH (ref 8–23)
CO2: 23 mmol/L (ref 22–32)
Calcium: 9.4 mg/dL (ref 8.9–10.3)
Chloride: 104 mmol/L (ref 98–111)
Creatinine, Ser: 3.01 mg/dL — ABNORMAL HIGH (ref 0.61–1.24)
GFR, Estimated: 21 mL/min — ABNORMAL LOW (ref >60)
Glucose, Bld: 200 mg/dL — ABNORMAL HIGH (ref 70–99)
Potassium: 4.2 mmol/L (ref 3.5–5.1)
Sodium: 138 mmol/L (ref 135–145)

## 2021-12-21 LAB — TROPONIN I (HIGH SENSITIVITY)
Troponin I (High Sensitivity): 17 ng/L (ref <18)
Troponin I (High Sensitivity): 19 ng/L — ABNORMAL HIGH (ref <18)

## 2021-12-21 LAB — BRAIN NATRIURETIC PEPTIDE: B Natriuretic Peptide: 197.1 pg/mL — ABNORMAL HIGH (ref 0.0–100.0)

## 2021-12-21 LAB — RESP PANEL BY RT-PCR (FLU A&B, COVID) ARPGX2
Influenza A by PCR: NEGATIVE
Influenza B by PCR: NEGATIVE
SARS Coronavirus 2 by RT PCR: NEGATIVE

## 2021-12-21 LAB — GLUCOSE, CAPILLARY
Glucose-Capillary: 145 mg/dL — ABNORMAL HIGH (ref 70–99)
Glucose-Capillary: 176 mg/dL — ABNORMAL HIGH (ref 70–99)

## 2021-12-21 LAB — HEMOGLOBIN A1C
Hgb A1c MFr Bld: 7 % — ABNORMAL HIGH (ref 4.8–5.6)
Mean Plasma Glucose: 154.2 mg/dL

## 2021-12-21 MED ORDER — DULOXETINE HCL 60 MG PO CPEP
60.0000 mg | ORAL_CAPSULE | Freq: Two times a day (BID) | ORAL | Status: DC
  Administered 2021-12-21 – 2021-12-24 (×7): 60 mg via ORAL
  Filled 2021-12-21 (×8): qty 1

## 2021-12-21 MED ORDER — APIXABAN 5 MG PO TABS
5.0000 mg | ORAL_TABLET | Freq: Two times a day (BID) | ORAL | Status: DC
  Administered 2021-12-21 – 2021-12-24 (×7): 5 mg via ORAL
  Filled 2021-12-21 (×7): qty 1

## 2021-12-21 MED ORDER — TRAZODONE HCL 50 MG PO TABS: 25.0000 mg | ORAL_TABLET | Freq: Every evening | ORAL | Status: DC | PRN

## 2021-12-21 MED ORDER — ACETAMINOPHEN 325 MG PO TABS
650.0000 mg | ORAL_TABLET | Freq: Four times a day (QID) | ORAL | Status: DC | PRN
Start: 2021-12-21 — End: 2021-12-24
  Administered 2021-12-21: 650 mg via ORAL
  Filled 2021-12-21: qty 2

## 2021-12-21 MED ORDER — FINASTERIDE 5 MG PO TABS
5.0000 mg | ORAL_TABLET | Freq: Every day | ORAL | Status: DC
  Administered 2021-12-21 – 2021-12-24 (×4): 5 mg via ORAL
  Filled 2021-12-21 (×4): qty 1

## 2021-12-21 MED ORDER — ACETAMINOPHEN 650 MG RE SUPP
650.0000 mg | Freq: Four times a day (QID) | RECTAL | Status: DC | PRN
Start: 2021-12-21 — End: 2021-12-24

## 2021-12-21 MED ORDER — INSULIN ASPART 100 UNIT/ML IJ SOLN
0.0000 [IU] | Freq: Three times a day (TID) | INTRAMUSCULAR | Status: DC
  Administered 2021-12-21: 2 [IU] via SUBCUTANEOUS
  Administered 2021-12-21: 1 [IU] via SUBCUTANEOUS
  Administered 2021-12-22 – 2021-12-23 (×2): 2 [IU] via SUBCUTANEOUS
  Administered 2021-12-23: 1 [IU] via SUBCUTANEOUS
  Administered 2021-12-23: 3 [IU] via SUBCUTANEOUS
  Administered 2021-12-24: 2 [IU] via SUBCUTANEOUS

## 2021-12-21 MED ORDER — ASPIRIN EC 81 MG PO TBEC
81.0000 mg | DELAYED_RELEASE_TABLET | Freq: Every day | ORAL | Status: DC
  Administered 2021-12-21 – 2021-12-24 (×4): 81 mg via ORAL
  Filled 2021-12-21 (×4): qty 1

## 2021-12-21 MED ORDER — POLYETHYLENE GLYCOL 3350 17 G PO PACK: 17.0000 g | PACK | Freq: Every day | ORAL | Status: DC | PRN

## 2021-12-21 MED ORDER — GABAPENTIN 300 MG PO CAPS: 300.0000 mg | ORAL_CAPSULE | Freq: Two times a day (BID) | ORAL | Status: DC

## 2021-12-21 MED ORDER — FUROSEMIDE 10 MG/ML IJ SOLN
40.0000 mg | Freq: Two times a day (BID) | INTRAMUSCULAR | Status: DC
  Administered 2021-12-21 – 2021-12-22 (×2): 40 mg via INTRAVENOUS
  Filled 2021-12-21 (×2): qty 4

## 2021-12-21 MED ORDER — TAMSULOSIN HCL 0.4 MG PO CAPS
0.4000 mg | ORAL_CAPSULE | Freq: Every day | ORAL | Status: DC
  Administered 2021-12-21 – 2021-12-23 (×3): 0.4 mg via ORAL
  Filled 2021-12-21 (×3): qty 1

## 2021-12-21 MED ORDER — DOCUSATE SODIUM 100 MG PO CAPS
100.0000 mg | ORAL_CAPSULE | Freq: Two times a day (BID) | ORAL | Status: DC
  Administered 2021-12-21 – 2021-12-24 (×7): 100 mg via ORAL
  Filled 2021-12-21 (×8): qty 1

## 2021-12-21 MED ORDER — LEVETIRACETAM 500 MG PO TABS
500.0000 mg | ORAL_TABLET | Freq: Two times a day (BID) | ORAL | Status: DC
  Administered 2021-12-21 – 2021-12-24 (×7): 500 mg via ORAL
  Filled 2021-12-21 (×7): qty 1

## 2021-12-21 MED ORDER — OXYCODONE HCL 5 MG PO TABS: 5.0000 mg | ORAL_TABLET | ORAL | Status: DC | PRN

## 2021-12-21 MED ORDER — TRAZODONE HCL 50 MG PO TABS: 50.0000 mg | ORAL_TABLET | Freq: Every day | ORAL | Status: DC

## 2021-12-21 MED ORDER — BISACODYL 5 MG PO TBEC: 5.0000 mg | DELAYED_RELEASE_TABLET | Freq: Every day | ORAL | Status: DC | PRN

## 2021-12-21 MED ORDER — SODIUM CHLORIDE 0.9% FLUSH
3.0000 mL | Freq: Two times a day (BID) | INTRAVENOUS | Status: DC
  Administered 2021-12-21 – 2021-12-24 (×7): 3 mL via INTRAVENOUS

## 2021-12-21 MED ORDER — FUROSEMIDE 10 MG/ML IJ SOLN
40.0000 mg | Freq: Once | INTRAMUSCULAR | Status: AC
  Administered 2021-12-21: 40 mg via INTRAVENOUS
  Filled 2021-12-21: qty 4

## 2021-12-21 MED ORDER — HYDRALAZINE HCL 20 MG/ML IJ SOLN: 5.0000 mg | INTRAMUSCULAR | Status: DC | PRN

## 2021-12-21 MED ORDER — CARVEDILOL 6.25 MG PO TABS
6.2500 mg | ORAL_TABLET | Freq: Two times a day (BID) | ORAL | Status: DC
  Administered 2021-12-21 – 2021-12-24 (×7): 6.25 mg via ORAL
  Filled 2021-12-21 (×5): qty 1
  Filled 2021-12-21: qty 2
  Filled 2021-12-21: qty 1

## 2021-12-21 MED ORDER — DIVALPROEX SODIUM 250 MG PO DR TAB
500.0000 mg | DELAYED_RELEASE_TABLET | Freq: Two times a day (BID) | ORAL | Status: DC
  Administered 2021-12-21 – 2021-12-24 (×7): 500 mg via ORAL
  Filled 2021-12-21 (×7): qty 2

## 2021-12-21 MED ORDER — ATORVASTATIN CALCIUM 80 MG PO TABS
80.0000 mg | ORAL_TABLET | Freq: Every day | ORAL | Status: DC
  Administered 2021-12-21 – 2021-12-24 (×4): 80 mg via ORAL
  Filled 2021-12-21: qty 1
  Filled 2021-12-21: qty 2
  Filled 2021-12-21 (×2): qty 1

## 2021-12-21 NOTE — Assessment & Plan Note (Addendum)
No active seizures, continue with keppra and divalproex.  ?

## 2021-12-21 NOTE — Assessment & Plan Note (Addendum)
No signs of urinary retention.  ?Continue with tamsulosin and finesteride.  ? ?

## 2021-12-21 NOTE — Assessment & Plan Note (Signed)
-  Will attempt to avoid/minimize QT-prolonging medications such as PPI, nausea meds, SSRIs ?-Repeat EKG in AM ?

## 2021-12-21 NOTE — Assessment & Plan Note (Addendum)
His glucose remained well controlled, at the time of his discharge his fasting glucose is 120. ?During his hospitalization he received insulin sliding scale for glucose cover and monitoring.  ?

## 2021-12-21 NOTE — Assessment & Plan Note (Deleted)
-  Patient with known h/o chronic combined CHF (EF 35% last month, prior h/o grade 1 diastolic dysfunction) presenting with worsening SOB, orthopnea, PND ?-He reports difficulty obtaining medications through the [place] and obtaining home O2 ?-CXR des not show pulmonary edema ?-Minimally elevated BNP  ?-Will admit on telemetry ?-Recent echo, will not repeat ?-Will continue ASA ?-CHF order set utilized ?-Was given Lasix 40 mg x 1 in ER and will repeat with 40 mg IV BID; hold home toresemide, although he has not apparently been taking this ?-prn [place] O2 (not currently needed) ?-Stable kidney function at this time, will follow ? ?HTN ?-Takes low-dose Coreg monotherapy at home, will continue ?-Will also add prn hydralazine ? ?HLD ?-Continue Lipitor ?

## 2021-12-21 NOTE — ED Triage Notes (Signed)
The pt arriv ed by [facility] from home  ha of anxiety  worse for the past 2-3 nights  [facility] he is out of his aniety medicine.  No son or chest pain on arrival ?

## 2021-12-21 NOTE — Assessment & Plan Note (Deleted)
-  Stable CKD ?-Will follow ?

## 2021-12-21 NOTE — Assessment & Plan Note (Addendum)
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 Virl Cagey, no vascular intervention is indicated since he is minimally ambulatory at baseline ? ?Continue anticoagulation with apixaban. ?

## 2021-12-21 NOTE — TOC Initial Note (Signed)
Transition of Care (TOC) - Initial/Assessment Note  ? ? ?Patient Details  ?Name: Tim Bradley ?MRN: [id number] ?Date of Birth: Jun 18, 1951 ? ?Transition of Care (TOC) CM/SW Contact:    ?Verdell Carmine, RN ?Phone Number: ?12/21/2021, 4:37 PM ? ?Clinical Narrative:                 ? ?Patient was apparently at SNF a couple of days ago. He states to PT that he does not want to return, would prefer to go home ( lives with sister. He has a history of drug use ( cocaine and Mariajuana)  ETOH use.and. Patient is current with the [place] [facility], having a difficult time procuring needed items through there such as oxygen and medications that were ordered. T recommends that he have full home health services if he will accept. Has DME, wheelchair, walker, BSC. ? ?[facility] will need to be called Monday to inquire about oxygen and medication orders, he can call and have them mail ordered to residence.  ?CM will follow for needs, recommendations, and transitions. ? ?Expected Discharge Plan: [place] ?Barriers to Discharge: Continued Medical Work up ? ? ?Patient Goals and CMS Choice ?  ?  ?  ? ?Expected Discharge Plan and Services ?Expected Discharge Plan: [place] ?  ?Discharge Planning Services: CM Consult ?  ?Living arrangements for the past 2 months: [place] ?                ?  ?  ?  ?  ?  ?HH Arranged: PT, OT, Nurse's Aide, Refused SNF, Social Work ?  ?  ?  ?  ? ?Prior Living Arrangements/Services ?Living arrangements for the past 2 months: [place] ?Lives with:: Relatives ?  ?       ?  ?  ?Current home services: DME ?  ? ?Activities of Daily Living ?  ?  ? ?Permission Sought/Granted ?  ?  ?   ?   ?   ?   ? ?Emotional Assessment ?  ?  ?  ?Orientation: : Oriented to Self, Oriented to Place, Oriented to  Time, Oriented to Situation ?Alcohol / Substance Use: Alcohol Use, Illicit Drugs, Tobacco Use ?Psych Involvement: No (comment) ? ?Admission diagnosis:  Acute on chronic systolic heart  failure ([place]) [I50.23] ?Patient Active Problem List  ? Diagnosis Date Noted  ? Acute on chronic systolic heart failure ([place]) 12/21/2021  ? DNR (do not resuscitate) 12/21/2021  ? DVT (deep venous thrombosis) ([place]) 12/21/2021  ? Substance induced mood disorder ([place]) 10/27/2020  ? Right leg DVT ([place]) 09/30/2020  ? Seizure disorder ([place]) 09/29/2020  ? Cardiomyopathy ([place]) 09/28/2020  ? AKI (acute kidney injury) ([place]) 09/28/2020  ? Pressure injury of skin 05/03/2020  ? Acute encephalopathy 05/02/2020  ? Diarrhea 05/02/2020  ? QT prolongation 05/02/2020  ? Acute combined systolic and diastolic congestive heart failure ([place]) 04/22/2020  ? Essential hypertension 04/11/2020  ? Leg edema 04/11/2020  ? Dyspnea 04/11/2020  ? Acute on chronic congestive heart failure ([place]) 04/11/2020  ? BPH (benign prostatic hyperplasia) 04/11/2020  ? CKD (chronic kidney disease) stage 4, GFR 15-29 ml/min (HCC) 04/11/2020  ? Generalized weakness 04/11/2020  ? Seizure ([place]) 07/21/2019  ? Cocaine abuse ([place])   ? Constipation 08/22/2018  ? Gastritis 08/20/2018  ? Heme positive stool   ? Acute blood loss anemia secondary to massive gastric ulcer   ? Hyponatremia   ? Abscess, gluteal, left 03/26/2017  ?  Type 2 diabetes mellitus ([place]) 03/26/2017  ? Adrenal nodule ([place]) 03/26/2017  ? Closed fracture of body of thoracic vertebra ([place]) 03/26/2017  ? Spinal stenosis of lumbar region 03/26/2017  ? ?PCP:  [place]:   ?[id number] - [place], [place] - [address] ?[place] ?[place] [address] ?Phone: [phone] Fax: [phone] ? ?[place], [place] - [address] ?[address] ?[place] [address] ?Phone: [phone] Fax: [phone] ? ?[place], [place]. ?[place] ?[place] [address] ?Phone: [phone] Fax: [phone] ? ? ? ? ?Social Determinants of Health (SDOH)  Interventions ?  ? ?Readmission Risk Interventions ? ?  10/01/2020  ? 12:53 PM 07/24/2019  ? 11:09 AM  ?Readmission Risk Prevention Plan  ?Transportation Screening Complete Complete  ?PCP or Specialist Appt within 3-5 Days  Complete  ?[place] or Home Care Consult  Complete  ?Social Work Consult for [place] Planning/Counseling  Complete  ?Palliative Care Screening  Not Applicable  ?Medication Review Press photographer) Complete Complete  ?PCP or Specialist appointment within 3-5 days of discharge Complete   ?[place] or Home Care Consult Complete   ?SW Recovery Care/Counseling Consult Complete   ?Palliative Care Screening Complete   ?[place] Patient Refused   ? ? ? ?

## 2021-12-21 NOTE — ED Provider Notes (Signed)
?[facility] ?Provider Note ? ? ?CSN: [id number] ?Arrival date & time: 12/21/21  0050 ? ?  ? ?History ? ?Chief Complaint  ?Patient presents with  ? Shortness of Breath  ? ? ?Tim Bradley is a 71 y.o. male. ? ?Patient presents to the emergency department for evaluation of shortness of breath.  Patient reports that he has awakened each of the last 3 nights feeling short of breath.  He is not sure if it is anxiety.  He reports that he seems to do better after waiting for a while but it occurred again tonight so he wanted to come and get checked out.  Patient normally goes to the [facility] but has been having difficulty getting his medications.  He also has recently been prescribed oxygen to use at night but has not received that yet either. ? ? ?  ? ?Home Medications ?Prior to Admission medications   ?Medication Sig Start Date End Date Taking? Authorizing Provider  ?apixaban (ELIQUIS) 5 MG TABS tablet Take 1 tablet (5 mg total) by mouth 2 (two) times daily. 10/07/20   Nita Sells, MD  ?aspirin 81 MG EC tablet Take 1 tablet (81 mg total) by mouth daily. Swallow whole. ?Patient not taking: No sig reported 07/17/20   Davonna Belling, MD  ?divalproex (DEPAKOTE) 500 MG DR tablet Take 1 tablet (500 mg total) by mouth every 12 (twelve) hours. 07/17/20 09/15/20  Davonna Belling, MD  ?DULoxetine (CYMBALTA) 60 MG capsule Take 60 mg by mouth 2 (two) times daily.    [provider]  ?famotidine (PEPCID) 20 MG tablet Take 20 mg by mouth daily as needed for heartburn or indigestion.    [provider]  ?gabapentin (NEURONTIN) 300 MG capsule Take 300 mg by mouth 2 (two) times daily.    [provider]  ?tamsulosin (FLOMAX) 0.4 MG CAPS capsule Take 1 capsule (0.4 mg total) by mouth daily after supper. 01/27/20   Deno Etienne, DO  ?traZODone (DESYREL) 50 MG tablet Take 50 mg by mouth at bedtime.    [provider]  ?triamterene-hydrochlorothiazide (MAXZIDE)  75-50 MG tablet Take 1 tablet by mouth daily.    [provider]  ?   ? ?Allergies    ?Patient has no known allergies.   ? ?Review of Systems   ?Review of Systems  ?Respiratory:  Positive for shortness of breath.   ? ?Physical Exam ?Updated Vital Signs ?BP (!) 164/102   Pulse 79   Resp 16   Ht 6\' 2"  (1.88 m)   Wt 122.5 kg   SpO2 100%   BMI 34.67 kg/m?  ?Physical Exam ?Vitals and nursing note reviewed.  ?Constitutional:   ?   General: He is not in acute distress. ?   Appearance: He is well-developed.  ?HENT:  ?   Head: Normocephalic and atraumatic.  ?   Mouth/Throat:  ?   Mouth: Mucous membranes are moist.  ?Eyes:  ?   General: Vision grossly intact. Gaze aligned appropriately.  ?   Extraocular Movements: Extraocular movements intact.  ?   Conjunctiva/sclera: Conjunctivae normal.  ?Cardiovascular:  ?   Rate and Rhythm: Normal rate and regular rhythm.  ?   Pulses: Normal pulses.  ?   Heart sounds: Normal heart sounds, S1 normal and S2 normal. No murmur heard. ?  No friction rub. No gallop.  ?Pulmonary:  ?   Effort: Pulmonary effort is normal. Tachypnea present. No respiratory distress.  ?   Breath sounds: Decreased  breath sounds present.  ?Abdominal:  ?   Palpations: Abdomen is soft.  ?   Tenderness: There is no abdominal tenderness. There is no guarding or rebound.  ?   Hernia: No hernia is present.  ?Musculoskeletal:     ?   General: No swelling.  ?   Cervical back: Full passive range of motion without pain, normal range of motion and neck supple. No pain with movement, spinous process tenderness or muscular tenderness. Normal range of motion.  ?   Right lower leg: No edema.  ?   Left lower leg: No edema.  ?Skin: ?   General: Skin is warm and dry.  ?   Capillary Refill: Capillary refill takes less than 2 seconds.  ?   Findings: No ecchymosis, erythema, lesion or wound.  ?Neurological:  ?   Mental Status: He is alert and oriented to person, place, and time.  ?   GCS: GCS eye subscore is 4. GCS verbal  subscore is 5. GCS motor subscore is 6.  ?   Cranial Nerves: Cranial nerves 2-12 are intact.  ?   Sensory: Sensation is intact.  ?   Motor: Motor function is intact. No weakness or abnormal muscle tone.  ?   Coordination: Coordination is intact.  ?Psychiatric:     ?   Mood and Affect: Mood normal.     ?   Speech: Speech normal.     ?   Behavior: Behavior normal.  ? ? ?ED Results / Procedures / Treatments   ?Labs ?(all labs ordered are listed, but only abnormal results are displayed) ?Labs Reviewed  ?CBC WITH DIFFERENTIAL/PLATELET - Abnormal; Notable for the following components:  ?    Result Value  ? RBC 3.92 (*)   ? Hemoglobin 11.4 (*)   ? HCT 36.3 (*)   ? All other components within normal limits  ?BASIC METABOLIC PANEL - Abnormal; Notable for the following components:  ? Glucose, Bld 200 (*)   ? BUN 64 (*)   ? Creatinine, Ser 3.01 (*)   ? GFR, Estimated 21 (*)   ? All other components within normal limits  ?BRAIN NATRIURETIC PEPTIDE - Abnormal; Notable for the following components:  ? B Natriuretic Peptide 197.1 (*)   ? All other components within normal limits  ?CBG MONITORING, ED - Abnormal; Notable for the following components:  ? Glucose-Capillary 197 (*)   ? All other components within normal limits  ?TROPONIN I (HIGH SENSITIVITY) - Abnormal; Notable for the following components:  ? Troponin I (High Sensitivity) 19 (*)   ? All other components within normal limits  ?RESP PANEL BY RT-PCR (FLU A&B, COVID) [id number]  ?TROPONIN I (HIGH SENSITIVITY)  ? ? ?EKG ?EKG Interpretation ? ?Date/Time:  Saturday December 21 2021 00:51:14 EDT ?Ventricular Rate:  82 ?PR Interval:  166 ?QRS Duration: 174 ?QT Interval:  459 ?QTC Calculation: 537 ?R Axis:   -41 ?Text Interpretation: Sinus rhythm Left atrial enlargement Left bundle branch block No significant change since last tracing Confirmed by Orpah Greek [phone]) on 12/21/2021 2:28:26 AM ? ?Radiology ?DG Chest Port 1 View ? ?Result Date: 12/21/2021 ?CLINICAL DATA:   Shortness of breath EXAM: PORTABLE CHEST 1 VIEW COMPARISON:  09/28/2020 FINDINGS: The heart size and mediastinal contours are within normal limits. Both lungs are clear. The visualized skeletal structures are unremarkable. Aortic calcifications are again noted. IMPRESSION: No acute abnormality noted. Electronically Signed   By: Inez Catalina M.D.   On: 12/21/2021 02:06   ? ?  Procedures ?Procedures  ? ? ?Medications Ordered in ED ?Medications  ?furosemide (LASIX) injection 40 mg (has no administration in time range)  ? ? ?ED Course/ Medical Decision Making/ A&P ?  ?                        ?Medical Decision Making ?Amount and/or Complexity of Data Reviewed ?Labs: ordered. ?Radiology: ordered. ? ?Risk ?Prescription drug management. ?Decision regarding hospitalization. ? ? ?Patient presents to the emergency department for evaluation of shortness of breath.  Patient sounds like he is experiencing orthopnea and paroxysmal nocturnal dyspnea.  He reports waking up very short of breath over the last 3 nights. ? ?Records were reviewed and he did have an echo and 2021 that showed ejection fraction of less than 20%.  He is not currently on diuretics.  Patient gets most of his care through the [facility] and it sounds like he is having some frustration getting his medications and oxygen. ? ?Patient with chronic kidney disease, creatinine of 3, around his baseline.  Chest x-ray does not show any obvious pulmonary edema.  First troponin normal, second troponin slightly elevated at 19.  No obvious ischemia on his work-up. ? ?Patient does have a history of DVT.  He is not experiencing any shortness of breath that would suggest PE currently.  He is supposedly on Xarelto.  He cannot get CT angiography because of chronic renal disease.  Would recommend continuing his Xarelto and can perform VQ scan if he does not respond to treatment.  I suspect his current treatment is secondary to volume overload and his congestive heart failure. ? ?Will  require hospitalization for further monitoring and treatment. ? ? ? ? ? ? ? ?Final Clinical Impression(s) / ED Diagnoses ?Final diagnoses:  ?Acute on chronic systolic congestive heart failure ([place])  ? ? ?Rx / DC Orders ?ED

## 2021-12-21 NOTE — Evaluation (Signed)
Physical Therapy Evaluation ?Patient Details ?Name: Tim Bradley ?MRN: [id number] ?DOB: Aug 01, 1951 ?Today's Date: 12/21/2021 ? ?History of Present Illness ? Pt is a 71 y/o male admitted secondary to worsening SOB. Thought to be secondary to CHF exacerbation. PMH includes HTN, DM, CHF, CKD, seizures, and substance abuse.  ?Clinical Impression ? Pt admitted secondary to problem above with deficits below. Requiring max A for bed mobility. Pt with posterior lean in sitting and unsafe to attempt further mobility with +1. Pt reports he left SNF a few days prior to admission and is living in an apartment. His sister has been staying with him, but reports he is in the process of hiring caregivers. Pt reports he plans to return home at d/c and does not want to go back to SNF. Recommending max HH services at d/c. Pt has all necessary DME. Will continue to follow acutely.    ?   ? ?Recommendations for follow up therapy are one component of a multi-disciplinary discharge planning process, led by the attending physician.  Recommendations may be updated based on patient status, additional functional criteria and insurance authorization. ? ?Follow Up Recommendations Home health PT (PT refusing SNF) ? ?  ?Assistance Recommended at Discharge Frequent or constant Supervision/Assistance  ?Patient can return home with the following ? Two people to help with walking and/or transfers;A lot of help with bathing/dressing/bathroom;Assistance with cooking/housework;Help with stairs or ramp for entrance;Assist for transportation ? ?  ?Equipment Recommendations None recommended by PT  ?Recommendations for Other Services ?    ?  ?Functional Status Assessment Patient has had a recent decline in their functional status and demonstrates the ability to make significant improvements in function in a reasonable and predictable amount of time.  ? ?  ?Precautions / Restrictions Precautions ?Precautions: Fall ?Restrictions ?Weight Bearing Restrictions: No   ? ?  ? ?Mobility ? Bed Mobility ?Overal bed mobility: Needs Assistance ?Bed Mobility: Supine to Sit, Sit to Supine ?  ?  ?Supine to sit: Max assist ?Sit to supine: Max assist ?  ?General bed mobility comments: Required assist for trunk and LE assist. Once sitting, pt with heavy posterior lean, so unable to safely attempt further mobility. ?  ? ?Transfers ?  ?  ?  ?  ?  ?  ?  ?  ?  ?  ?  ? ?Ambulation/Gait ?  ?  ?  ?  ?  ?  ?  ?  ? ?Stairs ?  ?  ?  ?  ?  ? ?Wheelchair Mobility ?  ? ?Modified Rankin (Stroke Patients Only) ?  ? ?  ? ?Balance Overall balance assessment: Needs assistance ?Sitting-balance support: Bilateral upper extremity supported ?Sitting balance-Leahy Scale: Poor ?Sitting balance - Comments: Reliant on at least mod A to maintain sitting balance. ?  ?  ?  ?  ?  ?  ?  ?  ?  ?  ?  ?  ?  ?  ?  ?   ? ? ? ?Pertinent Vitals/Pain Pain Assessment ?Pain Assessment: No/denies pain  ? ? ?Home Living Family/patient expects to be discharged to:: Private residence ?Living Arrangements: Alone ?Available Help at Discharge: Family;Available 24 hours/day ?Type of Home: Apartment ?Home Access: Level entry ?  ?  ?  ?Home Layout: One level ?Home Equipment: Conservation officer, nature (2 wheels);BSC/3in1;Wheelchair - manual;Wheelchair - power;Hospital bed;Other (comment) (hoyer lift and pad) ?Additional Comments: Reports sister has been staying with him, but they are working on getting caregiver.  ?  ?Prior Function  Prior Level of Function : Needs assist ?  ?  ?  ?  ?  ?  ?Mobility Comments: Sister has had to help with transfers since leaving SNF. Has hoyer if needed, but pt reports they have not had to use ?ADLs Comments: Reports sister assists with ADL tasks. ?  ? ? ?Hand Dominance  ?   ? ?  ?Extremity/Trunk Assessment  ? Upper Extremity Assessment ?Upper Extremity Assessment: Defer to OT evaluation ?  ? ?Lower Extremity Assessment ?Lower Extremity Assessment: Generalized weakness ?  ? ?Cervical / Trunk Assessment ?Cervical / Trunk  Assessment: Kyphotic  ?Communication  ? Communication: No difficulties  ?Cognition Arousal/Alertness: Awake/alert ?Behavior During Therapy: [facility] for tasks assessed/performed ?Overall Cognitive Status: No family/caregiver present to determine baseline cognitive functioning ?  ?  ?  ?  ?  ?  ?  ?  ?  ?  ?  ?  ?  ?  ?  ?  ?General Comments: Slow processing noted. ?  ?  ? ?  ?General Comments General comments (skin integrity, edema, etc.): Educated about needing to use hoyer for transfers, and pt agreeable. Reports he plans to go home and not back to SNF ? ?  ?Exercises    ? ?Assessment/Plan  ?  ?PT Assessment Patient needs continued PT services  ?PT Problem List Decreased strength;Decreased activity tolerance;Decreased balance;Decreased cognition;Decreased safety awareness;Decreased knowledge of precautions;Decreased knowledge of use of DME ? ?   ?  ?PT Treatment Interventions DME instruction;Functional mobility training;Therapeutic exercise;Therapeutic activities;Balance training;Patient/family education   ? ?PT Goals (Current goals can be found in the Care Plan section)  ?Acute Rehab PT Goals ?Patient Stated Goal: to go home ?PT Goal Formulation: With patient ?Time For Goal Achievement: 01/04/22 ?Potential to Achieve Goals: Good ? ?  ?Frequency Min 3X/week ?  ? ? ?Co-evaluation   ?  ?  ?  ?  ? ? ?  ?AM-PAC PT "6 Clicks" Mobility  ?Outcome Measure Help needed turning from your back to your side while in a flat bed without using bedrails?: A Lot ?Help needed moving from lying on your back to sitting on the side of a flat bed without using bedrails?: A Lot ?Help needed moving to and from a bed to a chair (including a wheelchair)?: Total ?Help needed standing up from a chair using your arms (e.g., wheelchair or bedside chair)?: Total ?Help needed to walk in hospital room?: Total ?Help needed climbing 3-5 steps with a railing? : Total ?6 Click Score: 8 ? ?  ?End of Session   ?Activity Tolerance: Patient tolerated treatment  well ?Patient left: in bed;with call bell/phone within reach (on stretcher in ED) ?Nurse Communication: Mobility status ?PT Visit Diagnosis: Unsteadiness on feet (R26.81);Muscle weakness (generalized) (M62.81) ?  ? ?Time: 0174-9449 ?PT Time Calculation (min) (ACUTE ONLY): 15 min ? ? ?Charges:   PT Evaluation ?$PT Eval Moderate Complexity: 1 Mod ?  ?  ?   ? ? ?Reuel Derby, PT, DPT  ?Acute Rehabilitation Services  ?Pager: [phone] ?Office: [phone] ? ? ?[place] ?12/21/2021, 12:31 PM ?

## 2021-12-21 NOTE — Assessment & Plan Note (Deleted)
-  I have discussed code status with the patient's sister; the patient would not desire resuscitation and would prefer to die a natural death should that situation arise. ?-He will need a gold out of facility DNR form at the time of discharge ?

## 2021-12-21 NOTE — ED Notes (Signed)
US at bedside

## 2021-12-21 NOTE — H&P (Addendum)
?History and Physical  ? ? ?Patient: Tim Bradley FYB:[id number] DOB: [birth year] ?DOA: 12/21/2021 ?DOS: the patient was seen and examined on 12/21/2021 ?PCP: [facility]  ?Patient coming from: Home - lives with sister; Donald Prose: Tim Bradley, [phone] ? ? ?Chief Complaint: SOB ? ?HPI: Tim Bradley is a 71 y.o. male with medical history significant of ETOH/cocaine dependence; chronic systolic CHF; stage 4 CKD; DM; HTN; and seizures presenting with SOB.  He is currently very somnolent - he awakens and briefly answers questions and then lapses back to sleep.  Reportedly, he has run out of diuretics and has been unable to get his home O2 supplies through the [facility] and so has had SOB, DOE, orthopnea, and PND.  No chest pain. ? ?He was last seen in the ER at [facility] on 3/13 for ?syncope vs. Seizure.  He presented from a facility, [facility] in [place].  His sister reported seizures secondary to trazodone and a "small brain bleed that worsens with trazodone."  He was monitored overnight, stable on Depakote and Keppra and discharged home with family. ? ?His sister reports that he has been doing well at home.  He has been urinating well.  His breathing has been good.  They are in a new apartment and they think the smell of the apartment might be an issue.  She spoke with the [facility] yesterday and they have been having difficulty getting his meds and home O2.  They have an appointment on 3/28 at the [facility].  Last night, she could hear him grunting and he was asking for air and the ceiling fan, he "was stuffy".  He felt like he needed more oxygen. ? ? ? ?ER Course:  [place], per Dr. Velia Meyer: ? ?3 days of shortness of breath with orthopnea and PND.  Patient has been experiencing difficulty in procuring his medications through the [facility] [place], noting that he is not currently on any diuretic medications.  He also reports that he is supposed to be on supplemental oxygen at home, but the oxygen delivery hardware has not yet  arrived.  No chest pain.  CXR without acute volume overload, negative.  However, in setting of the above history and clinical suspicion, the patient is being admitted for suspected acute on chronic systolic heart failure, including repeat echocardiogram and optimization of outpatient medications, including that of diuretic regimen.  Of note, he is also chronically anticoagulated on Eliquis, rendering acute PE to be less likely.  He has received a dose of IV Lasix in the ED this evening. ? ? ? ? ?Review of Systems: unable to review all systems due to the inability of the patient to answer questions.  He was too somnolent to stay awake. ? ?Past Medical History:  ?Diagnosis Date  ? Abscess 02/2017  ? LEFT GLUTEAL   ? Acid reflux   ? Alcohol abuse   ? Anemia   ? CHF (congestive heart failure) ([place])   ? CKD (chronic kidney disease) stage 4, GFR 15-29 ml/min (HCC) 04/11/2020  ? Cocaine abuse ([place])   ? DDD (degenerative disc disease), lumbar   ? Diabetes mellitus without complication ([place])   ? Dyspnea   ? Enlarged prostate   ? Hypertension   ? Seizures ([place])   ? Spinal stenosis, lumbar   ? ?Past Surgical History:  ?Procedure Laterality Date  ? BACK SURGERY    ? cervical x2  ? BIOPSY  08/21/2018  ? Procedure: BIOPSY;  Surgeon: Ronald Lobo, MD;  Location:  [facility];  Service: Endoscopy;;  ? ESOPHAGOGASTRODUODENOSCOPY (EGD) WITH PROPOFOL N/A 08/21/2018  ? Procedure: ESOPHAGOGASTRODUODENOSCOPY (EGD) WITH PROPOFOL;  Surgeon: Ronald Lobo, MD;  Location: [facility];  Service: Endoscopy;  Laterality: N/A;  ? INCISION AND DRAINAGE PERIRECTAL ABSCESS N/A 03/26/2017  ? Procedure: IRRIGATION AND DEBRIDEMENT PERIRECTAL ABSCESS;  Surgeon: Rolm Bookbinder, MD;  Location: [facility];  Service: General;  Laterality: N/A;  ? ?Social History:  reports that he has been smoking cigarettes. He has been smoking an average of .25 packs per day. He has never used smokeless tobacco. He reports current alcohol use of about 12.0 standard  drinks per week. He reports current drug use. Drugs: Cocaine and Marijuana. ? ?Allergies  ?Allergen Reactions  ? Trazodone And Nefazodone   ?  Family reports seizures from this medication  ? ? ?Family History  ?Problem Relation Age of Onset  ? Hypertension Mother   ? Cancer - Lung Father   ? ? ?Prior to Admission medications   ?Medication Sig Start Date End Date Taking? Authorizing Provider  ?apixaban (ELIQUIS) 5 MG TABS tablet Take 1 tablet (5 mg total) by mouth 2 (two) times daily. 10/07/20   Nita Sells, MD  ?aspirin 81 MG EC tablet Take 1 tablet (81 mg total) by mouth daily. Swallow whole. ?Patient not taking: No sig reported 07/17/20   Davonna Belling, MD  ?divalproex (DEPAKOTE) 500 MG DR tablet Take 1 tablet (500 mg total) by mouth every 12 (twelve) hours. 07/17/20 09/15/20  Davonna Belling, MD  ?DULoxetine (CYMBALTA) 60 MG capsule Take 60 mg by mouth 2 (two) times daily.    [provider]  ?famotidine (PEPCID) 20 MG tablet Take 20 mg by mouth daily as needed for heartburn or indigestion.    [provider]  ?gabapentin (NEURONTIN) 300 MG capsule Take 300 mg by mouth 2 (two) times daily.    [provider]  ?tamsulosin (FLOMAX) 0.4 MG CAPS capsule Take 1 capsule (0.4 mg total) by mouth daily after supper. 01/27/20   Deno Etienne, DO  ?traZODone (DESYREL) 50 MG tablet Take 50 mg by mouth at bedtime.    [provider]  ?triamterene-hydrochlorothiazide (MAXZIDE) 75-50 MG tablet Take 1 tablet by mouth daily.    [provider]  ? ? ?Physical Exam: ?Vitals:  ? 12/21/21 1515 12/21/21 1530 12/21/21 1545 12/21/21 1600  ?BP: (!) 149/86 (!) 152/84 (!) 147/91 (!) 156/86  ?Pulse: 62 64 69 67  ?Resp:   14 15  ?SpO2: 100% 100% 98% 100%  ?Weight:      ?Height:      ? ?General:  Appears calm and comfortable and is in NAD, very somnolent ?Eyes:  EOMI, normal lids, iris ?ENT:  grossly normal hearing, lips & tongue, mmm; edentulous ?Neck:  no LAD, masses or  thyromegaly ?Cardiovascular:  RRR, no m/r/g. 1-2+ LE edema, R > L.  ?Respiratory:   CTA bilaterally with no wheezes/rales/rhonchi.  Normal respiratory effort. ?Abdomen:  soft, NT, ND ?Skin:  no rash or induration seen on limited exam ?Musculoskeletal:  grossly normal tone BUE/BLE, good ROM, no bony abnormality; RLE with edema greater than L ?Psychiatric:  somnolent mood and affect, speech sparse but appropriate, AOx3 ?Neurologic:  unable to effectively perform ? ? ?Radiological Exams on Admission: ?Independently reviewed - see discussion in A/P where applicable ? ?DG Chest Port 1 View ? ?Result Date: 12/21/2021 ?CLINICAL DATA:  Shortness of breath EXAM: PORTABLE CHEST 1 VIEW COMPARISON:  09/28/2020 FINDINGS: The heart size and mediastinal contours are within  normal limits. Both lungs are clear. The visualized skeletal structures are unremarkable. Aortic calcifications are again noted. IMPRESSION: No acute abnormality noted. Electronically Signed   By: Inez Catalina M.D.   On: 12/21/2021 02:06  ? ?VAS [place] LOWER EXTREMITY VENOUS (DVT) ? ?Result Date: 12/21/2021 ? Lower Venous DVT Study Patient Name:  TAZ VANNESS Osborne  Date of Exam:   12/21/2021 Medical Rec #: [id number]      Accession #:    [id number] Date of Birth: 1951/02/13      Patient Gender: M Patient Age:   71 years Exam Location:  [facility] Procedure:      VAS [place] LOWER EXTREMITY VENOUS (DVT) Referring Phys: Anderson Malta Deandrae Wajda --------------------------------------------------------------------------------  Indications: Swelling.  Risk Factors: DVT RLE 09/2020. Anticoagulation: Eliquis. Limitations: Body habitus and poor ultrasound/tissue interface. Comparison Study: Previous exam on 09/30/20 was positive for DVT in RLE - EIV,                   CFV, SFJ, FV, PFV, POPV, & PTV Performing Technologist: Jody Hill RVT, RDMS  Examination Guidelines: A complete evaluation includes B-mode imaging, spectral Doppler, color Doppler, and power Doppler as needed of all accessible  portions of each vessel. Bilateral testing is considered an integral part of a complete examination. Limited examinations for reoccurring indications may be performed as noted. The reflux portion of the exam is performed with t

## 2021-12-21 NOTE — Progress Notes (Signed)
?  Carryover admission to the Day Admitter.  I discussed this case with the EDP, Dr.Pollina.  Per these discussions: ? ? ?This is a 71 year old male with history of chronic systolic heart failure, with most recent echocardiogram in 2021 reflecting LVEF less than 20%, along with history of stage IV chronic kidney disease with baseline creatinine 3.0, who is being admitted for suspected acute on chronic systolic heart failure in the setting of presenting 3 days of shortness of breath associate with orthopnea and PND.  Patient reports that he has been experiencing difficulty in procuring his medications through the [place], noting that he is not currently on any diuretic medications at home as a consequence of this.  He also reports that he is supposed to be on supplemental oxygen at home, but conveys that the oxygen delivery hardware has not yet arrived at his house.  Denies any recent chest pain.  Presenting chest x-ray does not overtly demonstrate acute volume overload, and shows no evidence of acute cardiopulmonary process.  However, in setting of the above history and clinical suspicion, the patient is being admitted for suspected acute on chronic systolic heart failure, including repeat echocardiogram and optimization of outpatient medications, including that of diuretic regimen.  Of note, he is also chronically anticoagulated on Eliquis, rendering acute PE to be less likely.  He has received a dose of IV Lasix in the ED this evening. ? ?I have placed an order for it inpatient admission to cardiac telemetry floor. ? ?I have placed some additional preliminary admit orders via the adult multi-morbid admission order set.  ? ? ? ?Babs Bertin, DO ?Hospitalist ? ?

## 2021-12-22 DIAGNOSIS — E1169 Type 2 diabetes mellitus with other specified complication: Secondary | ICD-10-CM

## 2021-12-22 DIAGNOSIS — N400 Enlarged prostate without lower urinary tract symptoms: Secondary | ICD-10-CM

## 2021-12-22 DIAGNOSIS — G40909 Epilepsy, unspecified, not intractable, without status epilepticus: Secondary | ICD-10-CM

## 2021-12-22 DIAGNOSIS — N189 Chronic kidney disease, unspecified: Secondary | ICD-10-CM

## 2021-12-22 DIAGNOSIS — E669 Obesity, unspecified: Secondary | ICD-10-CM | POA: Diagnosis present

## 2021-12-22 DIAGNOSIS — I825Y1 Chronic embolism and thrombosis of unspecified deep veins of right proximal lower extremity: Secondary | ICD-10-CM

## 2021-12-22 DIAGNOSIS — I10 Essential (primary) hypertension: Secondary | ICD-10-CM

## 2021-12-22 DIAGNOSIS — E785 Hyperlipidemia, unspecified: Secondary | ICD-10-CM

## 2021-12-22 DIAGNOSIS — N179 Acute kidney failure, unspecified: Secondary | ICD-10-CM

## 2021-12-22 LAB — BASIC METABOLIC PANEL
Anion gap: 8 (ref 5–15)
BUN: 61 mg/dL — ABNORMAL HIGH (ref 8–23)
CO2: 29 mmol/L (ref 22–32)
Calcium: 9.3 mg/dL (ref 8.9–10.3)
Chloride: 101 mmol/L (ref 98–111)
Creatinine, Ser: 2.9 mg/dL — ABNORMAL HIGH (ref 0.61–1.24)
GFR, Estimated: 22 mL/min — ABNORMAL LOW (ref >60)
Glucose, Bld: 135 mg/dL — ABNORMAL HIGH (ref 70–99)
Potassium: 4.1 mmol/L (ref 3.5–5.1)
Sodium: 138 mmol/L (ref 135–145)

## 2021-12-22 LAB — CBC
HCT: 35.5 % — ABNORMAL LOW (ref 39.0–52.0)
Hemoglobin: 11.1 g/dL — ABNORMAL LOW (ref 13.0–17.0)
MCH: 28.7 pg (ref 26.0–34.0)
MCHC: 31.3 g/dL (ref 30.0–36.0)
MCV: 91.7 fL (ref 80.0–100.0)
Platelets: 234 10*3/uL (ref 150–400)
RBC: 3.87 MIL/uL — ABNORMAL LOW (ref 4.22–5.81)
RDW: 14.2 % (ref 11.5–15.5)
WBC: 4.8 10*3/uL (ref 4.0–10.5)
nRBC: 0 % (ref 0.0–0.2)

## 2021-12-22 LAB — GLUCOSE, CAPILLARY
Glucose-Capillary: 115 mg/dL — ABNORMAL HIGH (ref 70–99)
Glucose-Capillary: 123 mg/dL — ABNORMAL HIGH (ref 70–99)
Glucose-Capillary: 99 mg/dL (ref 70–99)
Glucose-Capillary: 223 mg/dL — ABNORMAL HIGH (ref 70–99)

## 2021-12-22 MED ORDER — CHLORHEXIDINE GLUCONATE CLOTH 2 % EX PADS
6.0000 | MEDICATED_PAD | Freq: Every day | CUTANEOUS | Status: DC
  Administered 2021-12-22 – 2021-12-24 (×3): 6 via TOPICAL

## 2021-12-22 MED ORDER — MUPIROCIN 2 % EX OINT
1.0000 "application " | TOPICAL_OINTMENT | Freq: Two times a day (BID) | CUTANEOUS | Status: DC
  Administered 2021-12-22 – 2021-12-24 (×5): 1 via NASAL
  Filled 2021-12-22 (×2): qty 22

## 2021-12-22 MED ORDER — ENSURE ENLIVE PO LIQD
237.0000 mL | Freq: Two times a day (BID) | ORAL | Status: DC
  Administered 2021-12-23 – 2021-12-24 (×4): 237 mL via ORAL

## 2021-12-22 MED ORDER — ISOSORB DINITRATE-HYDRALAZINE 20-37.5 MG PO TABS
1.0000 | ORAL_TABLET | Freq: Three times a day (TID) | ORAL | Status: DC
  Administered 2021-12-22 – 2021-12-24 (×6): 1 via ORAL
  Filled 2021-12-22 (×6): qty 1

## 2021-12-22 MED ORDER — FUROSEMIDE 10 MG/ML IJ SOLN
60.0000 mg | Freq: Two times a day (BID) | INTRAMUSCULAR | Status: DC
  Administered 2021-12-22 – 2021-12-23 (×2): 60 mg via INTRAVENOUS
  Filled 2021-12-22 (×2): qty 6

## 2021-12-22 MED ORDER — MELATONIN 5 MG PO TABS
5.0000 mg | ORAL_TABLET | ORAL | Status: AC
  Administered 2021-12-22: 5 mg via ORAL
  Filled 2021-12-22: qty 1

## 2021-12-22 NOTE — Progress Notes (Signed)
Mobility Specialist Progress Note: ? ? 12/22/21 1310  ?Mobility  ?Activity Transferred from bed to chair;Stood at bedside  ?Level of Assistance Total care  ?Assistive Device MaxiMove;Front wheel walker  ?Activity Response Tolerated well  ?$Mobility charge 1 Mobility  ? ?Transferred pt to chair vis maximove, pt tolerated well. Attempted to stand from chair with maxA+2, pt successful. Unable to fully extend hips upon standing, however pt pleased with progress. Left pt in chair with chair alarm on, all needs met.  ? ?Nelta Numbers ?Acute Rehab ?Phone: [phone] ?Office Phone: [phone] ? ?

## 2021-12-22 NOTE — Evaluation (Signed)
Occupational Therapy Evaluation ?Patient Details ?Name: Tim Bradley ?MRN: [id number] ?DOB: September 04, 1951 ?Today's Date: 12/22/2021 ? ? ?History of Present Illness 71 y/o male admitted secondary to worsening SOB. Thought to be secondary to CHF exacerbation. PMH includes HTN, DM, CHF, CKD, seizures, and substance abuse.  ? ?Clinical Impression ?  ?PTA, pt was living alone but reports his sister stays with him and assists with all his ADLs. Pt reporting his sister has been helping him transfer to w/c and he has a hoyer lift at home but they haven't used it yet because it is new.  Pt currently requiring Mod A for UB ADLs, Max A for LB ADLs, and Max A +2 for sit<>stand. Pt very agreeable to OOB to recliner. Use of maxi-move for transfer to recliner. Pt performing sit<>stand from recliner with Max A +2 for power up and weight shift forward as well as cues for hand and foot placement. Once in standing, pt tolerating standing for ~60 secs with cues for upright posture. Pt demonstrating decreased balance, strength, and activity tolerance. Pt would benefit from further acute OT to facilitate safe dc. Pending confirmation of family support at home, recommend dc to home with [facility] for further OT to optimize safety, independence with ADLs, and return to PLOF.  ?   ? ?Recommendations for follow up therapy are one component of a multi-disciplinary discharge planning process, led by the attending physician.  Recommendations may be updated based on patient status, additional functional criteria and insurance authorization.  ? ?Follow Up Recommendations ? Home health OT (Reports he wants to return home and has all the DME he needs.)  ?  ?Assistance Recommended at Discharge Frequent or constant Supervision/Assistance  ?Patient can return home with the following A lot of help with walking and/or transfers;A lot of help with bathing/dressing/bathroom ? ?  ?Functional Status Assessment ? Patient has had a recent decline in their functional  status and demonstrates the ability to make significant improvements in function in a reasonable and predictable amount of time.  ?Equipment Recommendations ? None recommended by OT  ?  ?Recommendations for Other Services PT consult ? ? ?  ?Precautions / Restrictions Precautions ?Precautions: Fall  ? ?  ? ?Mobility Bed Mobility ?Overal bed mobility: Needs Assistance ?Bed Mobility: Rolling ?Rolling: Mod assist, +2 for physical assistance ?  ?  ?  ?  ?General bed mobility comments: Mod A for rolling left and right for lift pad placement ?  ? ?Transfers ?Overall transfer level: Needs assistance ?Equipment used: Rolling walker (2 wheels) ?Transfers: Sit to/from Stand, Bed to chair/wheelchair/BSC ?Sit to Stand: Max assist, +2 physical assistance ?  ?  ?  ?  ?  ?General transfer comment: Use of maxi-move to transfer to recliner. Once in recliner, Pt performing sit<>stand with Max A +2 for power up and weight shift forward. Cues for foot and hand placement ?Transfer via Lift Equipment: [place] ? ?  ?Balance Overall balance assessment: Needs assistance ?Sitting-balance support: No upper extremity supported, Feet supported ?Sitting balance-Leahy Scale: Fair ?  ?  ?Standing balance support: Bilateral upper extremity supported, During functional activity ?Standing balance-Leahy Scale: Poor ?  ?  ?  ?  ?  ?  ?  ?  ?  ?  ?  ?  ?   ? ?ADL either performed or assessed with clinical judgement  ? ?ADL Overall ADL's : Needs assistance/impaired ?Eating/Feeding: Set up;Sitting ?  ?Grooming: Supervision/safety;Set up;Sitting ?  ?Upper Body Bathing: Moderate assistance;Sitting ?  ?Lower Body  Bathing: Maximal assistance;Sit to/from stand;Bed level ?  ?Upper Body Dressing : Moderate assistance;Sitting ?  ?Lower Body Dressing: Maximal assistance;Sit to/from stand;Bed level ?  ?  ?Toilet Transfer Details (indicate cue type and reason): Use of maxi move for transfer to recliner. Pt then performing sit<>stand with Max A +2 from recliner. ?  ?   ?  ?  ?Functional mobility during ADLs: Maximal assistance;+2 for physical assistance (sit<>stand from recliner) ?General ADL Comments: Pt agreeable to OOB. Using maxi-move for transfer to recliner. Pt performing sit<>stand from recliner with Max A +2 and then sustaining standing balance with Mod A for ~1 min.  ? ? ? ?Vision   ?   ?   ?Perception   ?  ?Praxis   ?  ? ?Pertinent Vitals/Pain Pain Assessment ?Pain Assessment: No/denies pain  ? ? ? ?Hand Dominance   ?  ?Extremity/Trunk Assessment Upper Extremity Assessment ?Upper Extremity Assessment: Overall WFL for tasks assessed ?  ?Lower Extremity Assessment ?Lower Extremity Assessment: Defer to PT evaluation ?  ?Cervical / Trunk Assessment ?Cervical / Trunk Assessment: Kyphotic ?  ?Communication Communication ?Communication: No difficulties ?  ?Cognition Arousal/Alertness: Awake/alert ?Behavior During Therapy: [facility] for tasks assessed/performed ?Overall Cognitive Status: No family/caregiver present to determine baseline cognitive functioning ?  ?  ?  ?  ?  ?  ?  ?  ?  ?  ?  ?  ?  ?  ?  ?  ?General Comments: Very agreeable to participate in therapy. Decreased safety awareness and requiring increased time. No family present to confirm baseline. RN reporting episodes of confusion throughout the day ?  ?  ?General Comments  VSS ? ?  ?Exercises   ?  ?Shoulder Instructions    ? ? ?Home Living Family/patient expects to be discharged to:: Private residence ?Living Arrangements: Alone ?Available Help at Discharge: Family;Available 24 hours/day (Sister) ?Type of Home: Apartment ?Home Access: Level entry ?  ?  ?Home Layout: One level ?  ?  ?Bathroom Shower/Tub: Walk-in shower ?  ?Bathroom Toilet: Standard ?  ?  ?Home Equipment: Conservation officer, nature (2 wheels);BSC/3in1;Wheelchair - manual;Wheelchair - power;Hospital bed;Other (comment) Harrel Lemon lift) ?  ?Additional Comments: Reports sister has been staying with him, but they are working on getting caregiver. ?  ? ?  ?Prior  Functioning/Environment Prior Level of Function : Needs assist ?  ?  ?  ?  ?  ?  ?Mobility Comments: Sister has had to help with transfers since leaving SNF. Has hoyer if needed, but pt reports they have not had to use ?ADLs Comments: Reports sister assists with ADL tasks. Reports he has been performing bathing at bed level. ?  ? ?  ?  ?OT Problem List: Decreased strength;Decreased range of motion;Decreased activity tolerance;Impaired balance (sitting and/or standing);Decreased knowledge of precautions;Decreased knowledge of use of DME or AE ?  ?   ?OT Treatment/Interventions: Self-care/ADL training;Therapeutic exercise;Energy conservation;DME and/or AE instruction;Therapeutic activities;Patient/family education  ?  ?OT Goals(Current goals can be found in the care plan section) Acute Rehab OT Goals ?Patient Stated Goal: Go home ?OT Goal Formulation: With patient ?Time For Goal Achievement: 01/05/22 ?Potential to Achieve Goals: Good  ?OT Frequency: Min 2X/week ?  ? ?Co-evaluation   ?  ?  ?  ?  ? ?  ?AM-PAC OT "6 Clicks" Daily Activity     ?Outcome Measure Help from another person eating meals?: A Little ?Help from another person taking care of personal grooming?: A Little ?Help from another person toileting, which includes  using toliet, bedpan, or urinal?: A Lot ?Help from another person bathing (including washing, rinsing, drying)?: A Lot ?Help from another person to put on and taking off regular upper body clothing?: A Lot ?Help from another person to put on and taking off regular lower body clothing?: Total ?6 Click Score: 13 ?  ?End of Session Equipment Utilized During Treatment: Rolling walker (2 wheels) ?Nurse Communication: Mobility status;Need for lift equipment ? ?Activity Tolerance: Patient tolerated treatment well ?Patient left: in chair;with call bell/phone within reach;with chair alarm set ? ?OT Visit Diagnosis: Other abnormalities of gait and mobility (R26.89);Muscle weakness (generalized)  (M62.81);Unsteadiness on feet (R26.81)  ?              ?Time: 1740-8144 ?OT Time Calculation (min): 29 min ?Charges:  OT General Charges ?$OT Visit: 1 Visit ?OT Evaluation ?$OT Eval Moderate Complexity: 1 Mod ?OT Treatments ?$S

## 2021-12-22 NOTE — Assessment & Plan Note (Addendum)
Patient tolerated well combination of hydralazine and isosorbide, holding on RAS inhibition due to low GFR.  ?Follow as outpatient.  ?

## 2021-12-22 NOTE — Assessment & Plan Note (Signed)
Calculated BMI is 32.3  

## 2021-12-22 NOTE — Assessment & Plan Note (Addendum)
Stage 4 CKD  ? ?Patient tolerated well diuresis with furosemide, his peak cr reached 3.31 ?At the time of discharge his renal function showed a serum cr of 3,17 with K at 4,1 and serum bicarbonate at 27.  ?Plan to continue diuresis with torsemide and follow up renal function as outpatient.  ? ?

## 2021-12-22 NOTE — Progress Notes (Signed)
Initial Nutrition Assessment ? ?DOCUMENTATION CODES:  ? ?Not applicable ? ?INTERVENTION:  ?Provide Ensure Enlive po BID, each supplement provides 350 kcal and 20 grams of protein. ? ?Encourage adequate PO intake.  ? ?NUTRITION DIAGNOSIS:  ? ?Increased nutrient needs related to chronic illness (HF) as evidenced by estimated needs. ? ?GOAL:  ? ?Patient will meet greater than or equal to 90% of their needs ? ?MONITOR:  ? ?PO intake, Supplement acceptance, Skin, Weight trends, Labs, I & O's ? ?REASON FOR ASSESSMENT:  ? ?Consult ?Assessment of nutrition requirement/status ? ?ASSESSMENT:  ? ?71 yo male with the past medical history of heart failure, chronic kidney disease, hypertension, type 2 diabetes mellitus, seizures and substance abuse (cocaine) who presented with dyspnea. Pt with acute on chronic systolic heart failure. ? ?Pt unavailable during attempted time of contact. Meal completion has been 85-100%. RD to order nutritional supplements to aid in caloric and protein needs. Unable to complete Nutrition-Focused physical exam at this time.  ? ?Labs and medications reviewed.  ? ?Diet Order:   ?Diet Order   ? ?       ?  Diet heart healthy/carb modified Room service appropriate? Yes; Fluid consistency: Thin; Fluid restriction: 1200 mL Fluid  Diet effective now       ?  ? ?  ?  ? ?  ? ? ?EDUCATION NEEDS:  ? ?Not appropriate for education at this time ? ?Skin:  Skin Assessment: Reviewed RN Assessment ? ?Last BM:  3/24 ? ?Height:  ? ?Ht Readings from Last 1 Encounters:  ?12/21/21 6\' 2"  (1.88 m)  ? ? ?Weight:  ? ?Wt Readings from Last 1 Encounters:  ?12/22/21 114.4 kg  ? ?BMI:  Body mass index is 32.38 kg/m?. ? ?Estimated Nutritional Needs:  ? ?Kcal:  2100-2300 ? ?Protein:  110-120 grams ? ?Fluid:  1.2 L/day ? ?Corrin Parker, MS, RD, LDN ?RD pager number/after hours weekend pager number on Amion. ? ?

## 2021-12-22 NOTE — Hospital Course (Addendum)
Mr. Reser was admitted to the hospital with the working diagnosis of decompensated heart failure.  ? ?71 yo male with the past medical history of heart failure, chronic kidney disease, hypertension, type 2 diabetes mellitus, seizures and substance abuse (cocaine) who presented with dyspnea. Patient run out of his diuretics at home. He is living in a new apartment. Reported dyspnea, lower extremity edema, PND and orthopnea. The night before admission his dyspnea became acutely worse and he was brought to the hospital for further evaluation. On his initial physical examination his blood pressure was 149/86, HR 62, RR 14 and 02 saturation 98%. His lungs had no wheezing or rales, heart with S1 and S2 present and rhythmic with no gallops or rubs, abdomen not distended, positive lower extremity edema.  ? ?Na 138, K 4,2. CL 104 bicarbonate 23, glucose 200, bun 64 cr 3,0 ?BNP 197 ?High sensitive troponin 17 and 19 ?Wbc 5,7 hgb 11.4 hct 36.3 plt 239 ?Sars covid 19 negative ? ?Chest radiograph with cardiomegaly with mild hilar vascular congestion.  ? ?EKG 82 bpm, left axis deviation, left bundle brunch block, sinus rhythm with poor R wave progression, with no significant ST segment or T wave changes.  ? ?Patient has been placed on diuretic therapy with good toleration.  ?[place] lower extremities showed positive DVT on the right and he was placed on anticoagulation with good toleration.  ? ?Patient will continue torsemide 50 mg daily at home and will follow up renal function as outpatient.  ?

## 2021-12-22 NOTE — Assessment & Plan Note (Addendum)
Patient was admitted to the cardiac ward and received aggressive diuresis with IV furosemide, negative fluid balanced was achieved, with significant improvement in his symptoms.  ? ?Recent echocardiogram from 10/2021 showed moderate LVH, systolic function with EF 40 to 45%, with moderate hypokinesis of the inferior segments, RV with preserved systolic function, no significant valvular disease.  ? ?Patient will continue heart failure management with carvedilol and added hydralazine/ isosorbide combination for after load reduction.  ?His torsemide will be renewed, continue same dose of 50 mg daily. ?Follow up as outpatient.  ? ?

## 2021-12-22 NOTE — Progress Notes (Signed)
?Progress Note ? ? ?Patient: Tim Bradley NKN:[id number] DOB: [birth year] DOA: 12/21/2021     1 ?DOS: the patient was seen and examined on 12/22/2021 ?  ?Brief hospital course: ?[name] was admitted to the hospital with the working diagnosis of decompensated heart failure.  ? ?71 yo male with the past medical history of heart failure, chronic kidney disease, hypertension, type 2 diabetes mellitus, seizures and substance abuse (cocaine) who presented with dyspnea. Patient run out of his diuretics and was not able to get his home supplemental 02, he is living in a new apartment. Reported dyspnea, lower extremity edema, PND and orthopnea. The night before admission his dyspnea became acutely worse and he was brought to the hospital for further evaluation. On his initial physical examination his blood pressure was 149/86, HR 62, RR 14 and 02 saturation 98%. His lungs had no wheezing or rales, heart with S1 and S2 present and rhythmic with no gallops or rubs, abdomen not distended, positive lower extremity edema.  ? ?Na 138, K 4,2. CL 104 bicarbonate 23, glucose 200, bun 64 cr 3,0 ?BNP 197 ?High sensitive troponin 17 and 19 ?Wbc 5,7 hgb 11.4 hct 36.3 plt 239 ?Sars covid 19 negative ? ?Chest radiograph with cardiomegaly with mild hilar vascular congestion.  ? ?EKG 82 bpm, left axis deviation, left bundle brunch block, sinus rhythm with poor R wave progression, with no significant ST segment or T wave changes.  ? ?Patient has been placed on diuretic therapy.  ? ?Assessment and Plan: ?* Acute on chronic systolic heart failure (HCC) ?Patient continue with lower extremity edema, dyspnea has improved ? ?Documented urine output is 650 ml ?Blood pressure systolic 379 to 024 mmHg.  ? ?Plan to increase furosemide to 60 mg IV q12 ?Continue to target further negative fluid balance.  ?Continue with carvedilol.  ?Follow up on echocardiogram, 2021 study with LV EF less than 205, with mid reduction in RV systolic function.  ?Patient  follows up at the [place]  ? ?Add Bidil for afterload reduction.  ? ? ? ?Acute kidney injury superimposed on chronic kidney disease ([place]) ?Stage 4 CKD  ? ?Renal function with serum cr at 2.9 with K at 4,1 and serum bicarbonate at 29, ?Plan to continue diuresis with furosemide, follow up renal function in am. ?Avoid hypotension and nephrotoxic medications.  ? ?Essential hypertension ?Continue blood pressure monitoring, will continue diuresis with furosemide. ?Add bidil for afterload reduction.  ? ?Type 2 diabetes mellitus with hyperlipidemia ([place]) ?Fasting glucose is 135 this am. ?Plan to continue insulin sliding scale for glucose cover and monitoring.  ? ?BPH (benign prostatic hyperplasia) ?No signs of urinary retention.  ?Continue with tamsulosin and finesteride.  ? ? ?Seizure disorder ([place]) ?No active seizures, continue with keppra and divalproex.  ? ?DVT (deep venous thrombosis) ([place]) ?RLE DVT US ordered and shows extensive clot burden, likely chronic ?-d/w Dr. Virl Cagey, no vascular intervention is indicated since he is minimally ambulatory at baseline ? ?Continue anticoagulation with apixaban. (question compliance as outpatient).  ? ?Class 1 obesity ?Calculated BMI is 32.3 ? ? ? ? ?  ? ?Subjective: patient is feeling better, but continue to have edema at his lower extremities, he is not ambulatory at home due to back injury  ? ?Physical Exam: ?Vitals:  ? 12/21/21 2358 12/22/21 0416 12/22/21 0720 12/22/21 1100  ?BP: (!) 143/89 (!) 143/87 130/82   ?Pulse: 65 68 69 65  ?Resp: 18 19 15 16   ?Temp: 97.8 ?F (36.6 ?C) 98.2 ?F (36.8 ?  C) 97.7 ?F (36.5 ?C)   ?TempSrc:  Oral Oral   ?SpO2: 100% 99% 100% 99%  ?Weight:  114.4 kg    ?Height:      ? ?Neurology awake and alert ?ENT with no pallor ?Cardiovascular with S1 and S2 present and rhythmic with no gallops, rubs or murmurs. ?No JVD ?Positive lower extremity edema +++ pitting bilaterally ?Respiratory with no wheezing or rhonchi, no rales on anterior auscultation.  ?Abdomen not  distended  ?Data Reviewed: ? ? ? ?Family Communication: no family at the bedside  ? ?Disposition: ?Status is: Inpatient ?Remains inpatient appropriate because: heart failure  ? Planned Discharge Destination: Home ? ? ? ?Author: ?Tawni Millers, MD ?12/22/2021 1:42 PM ? ?For on call review [email or web address].  ?

## 2021-12-23 ENCOUNTER — Other Ambulatory Visit (HOSPITAL_COMMUNITY): Payer: Self-pay

## 2021-12-23 LAB — BASIC METABOLIC PANEL
Anion gap: 8 (ref 5–15)
BUN: 70 mg/dL — ABNORMAL HIGH (ref 8–23)
CO2: 29 mmol/L (ref 22–32)
Calcium: 9 mg/dL (ref 8.9–10.3)
Chloride: 99 mmol/L (ref 98–111)
Creatinine, Ser: 3.31 mg/dL — ABNORMAL HIGH (ref 0.61–1.24)
GFR, Estimated: 19 mL/min — ABNORMAL LOW (ref >60)
Glucose, Bld: 140 mg/dL — ABNORMAL HIGH (ref 70–99)
Potassium: 4.1 mmol/L (ref 3.5–5.1)
Sodium: 136 mmol/L (ref 135–145)

## 2021-12-23 LAB — GLUCOSE, CAPILLARY
Glucose-Capillary: 130 mg/dL — ABNORMAL HIGH (ref 70–99)
Glucose-Capillary: 201 mg/dL — ABNORMAL HIGH (ref 70–99)
Glucose-Capillary: 170 mg/dL — ABNORMAL HIGH (ref 70–99)
Glucose-Capillary: 145 mg/dL — ABNORMAL HIGH (ref 70–99)

## 2021-12-23 LAB — MAGNESIUM: Magnesium: 2.1 mg/dL (ref 1.7–2.4)

## 2021-12-23 MED ORDER — FUROSEMIDE 10 MG/ML IJ SOLN
80.0000 mg | Freq: Two times a day (BID) | INTRAMUSCULAR | Status: DC
  Administered 2021-12-23 – 2021-12-24 (×2): 80 mg via INTRAVENOUS
  Filled 2021-12-23 (×2): qty 8

## 2021-12-23 NOTE — Progress Notes (Signed)
?Progress Note ? ? ?Patient: Tim Bradley QQP:[id number] DOB: 08/12/1951 DOA: 12/21/2021     2 ?DOS: the patient was seen and examined on 12/23/2021 ?  ?Brief hospital course: ?Tim Bradley was admitted to the hospital with the working diagnosis of decompensated heart failure.  ? ?71 yo male with the past medical history of heart failure, chronic kidney disease, hypertension, type 2 diabetes mellitus, seizures and substance abuse (cocaine) who presented with dyspnea. Patient run out of his diuretics and was not able to get his home supplemental 02, he is living in a new apartment. Reported dyspnea, lower extremity edema, PND and orthopnea. The night before admission his dyspnea became acutely worse and he was brought to the hospital for further evaluation. On his initial physical examination his blood pressure was 149/86, HR 62, RR 14 and 02 saturation 98%. His lungs had no wheezing or rales, heart with S1 and S2 present and rhythmic with no gallops or rubs, abdomen not distended, positive lower extremity edema.  ? ?Na 138, K 4,2. CL 104 bicarbonate 23, glucose 200, bun 64 cr 3,0 ?BNP 197 ?High sensitive troponin 17 and 19 ?Wbc 5,7 hgb 11.4 hct 36.3 plt 239 ?Sars covid 19 negative ? ?Chest radiograph with cardiomegaly with mild hilar vascular congestion.  ? ?EKG 82 bpm, left axis deviation, left bundle brunch block, sinus rhythm with poor R wave progression, with no significant ST segment or T wave changes.  ? ?Patient has been placed on diuretic therapy.  ? ?Assessment and Plan: ?* Acute on chronic systolic heart failure (HCC) ?Patient continue with lower extremity edema, dyspnea has improved ? ?Documented urine output is 600 ml ?Blood pressure systolic 712 to 458 mmHg ? ?Clinically continue to have significant edema at his lower extremities.  ? ?Further increase furosemide to 80 mg IV q12 ?On carvedilol.  ? ?Pending repeat echocardiogram.  ?2021 study with LV EF less than 205, with mid reduction in RV systolic  function.  ?Patient follows up at the [place]  ? ?Continue with Bidil for afterload reduction with good toleration.  ? ? ? ?Acute kidney injury superimposed on chronic kidney disease ([place]) ?Stage 4 CKD  ? ?Patient continue to have fluid overload.  ?Renal function with serum cr at 3,31 with K at 4,1 and serum bicarbonate at 29.  ? ?Plan to continue diuresis with furosemide to target further negative fluid balance.  ?Avoid hypotension and nephrotoxic medications.  ? ?Essential hypertension ?Continue blood pressure control with bidil and carvedilol.  ? ?Type 2 diabetes mellitus with hyperlipidemia ([place]) ?Fasting glucose is 140 this am. ?Continue insulin sliding scale for glucose cover and monitoring.  ? ?BPH (benign prostatic hyperplasia) ?No signs of urinary retention.  ?Continue with tamsulosin and finesteride.  ? ? ?Seizure disorder ([place]) ?No active seizures, continue with keppra and divalproex.  ? ?DVT (deep venous thrombosis) ([place]) ?RLE DVT US ordered and shows extensive clot burden, likely chronic ?-d/w Dr. Virl Cagey, no vascular intervention is indicated since he is minimally ambulatory at baseline ? ?Continue anticoagulation with apixaban. (question compliance as outpatient).  ? ?Class 1 obesity ?Calculated BMI is 32.3 ? ? ? ? ?  ? ?Subjective: patient with no dyspnea, or chest pain, continue with lower extremity edema,  ? ?Physical Exam: ?Vitals:  ? 12/23/21 0436 12/23/21 0710 12/23/21 1104 12/23/21 1514  ?BP: 133/75 122/72 119/74 134/74  ?Pulse: 69 65 72 81  ?Resp: 19 16 19 20   ?Temp: 98.5 ?F (36.9 ?C) 97.7 ?F (36.5 ?C) 97.7 ?F (36.5 ?C)  97.8 ?F (36.6 ?C)  ?TempSrc: Oral Oral Oral Oral  ?SpO2: 100% 96% 95% 96%  ?Weight: 116.4 kg     ?Height:      ? ?Neurology awake and alert ?ENT with no pallor ?Cardiovascular with S1 and S2 present and rhythmic with no gallops or murmurs ?No JVD ?Positive lower extremity edema pitting ++ ?Respiratory with no wheezing or rales ?Abdomen protuberant  ?Data Reviewed: ? ? ? ?Family  Communication: no family at the bedside. I was note able to reach his sister over the phone, I left a message.  ? ?Disposition: ?Status is: Inpatient ?Remains inpatient appropriate because: heart failure  ? Planned Discharge Destination: Home ? ? ? ? ?Author: ?Tawni Millers, MD ?12/23/2021 3:20 PM ? ?For on call review [email or web address]  ?

## 2021-12-23 NOTE — Plan of Care (Signed)
?  Problem: Education: ?Goal: Ability to demonstrate management of disease process will improve ?Outcome: Progressing ?  ?Problem: Activity: ?Goal: Capacity to carry out activities will improve ?Outcome: Progressing ?  ?Problem: Cardiac: ?Goal: Ability to achieve and maintain adequate cardiopulmonary perfusion will improve ?Outcome: Progressing ?  ?

## 2021-12-24 ENCOUNTER — Other Ambulatory Visit (HOSPITAL_COMMUNITY): Payer: Self-pay

## 2021-12-24 ENCOUNTER — Inpatient Hospital Stay (HOSPITAL_COMMUNITY): Payer: No Typology Code available for payment source

## 2021-12-24 LAB — BASIC METABOLIC PANEL
Anion gap: 11 (ref 5–15)
BUN: 73 mg/dL — ABNORMAL HIGH (ref 8–23)
CO2: 27 mmol/L (ref 22–32)
Calcium: 9.1 mg/dL (ref 8.9–10.3)
Chloride: 98 mmol/L (ref 98–111)
Creatinine, Ser: 3.17 mg/dL — ABNORMAL HIGH (ref 0.61–1.24)
GFR, Estimated: 20 mL/min — ABNORMAL LOW (ref >60)
Glucose, Bld: 120 mg/dL — ABNORMAL HIGH (ref 70–99)
Potassium: 4.1 mmol/L (ref 3.5–5.1)
Sodium: 136 mmol/L (ref 135–145)

## 2021-12-24 LAB — GLUCOSE, CAPILLARY
Glucose-Capillary: 105 mg/dL — ABNORMAL HIGH (ref 70–99)
Glucose-Capillary: 159 mg/dL — ABNORMAL HIGH (ref 70–99)

## 2021-12-24 LAB — MAGNESIUM: Magnesium: 2.3 mg/dL (ref 1.7–2.4)

## 2021-12-24 MED ORDER — ENSURE ENLIVE PO LIQD
237.0000 mL | Freq: Two times a day (BID) | ORAL | 0 refills | Status: DC
  Filled 2021-12-24: qty 14220, 30d supply, fill #0

## 2021-12-24 MED ORDER — ISOSORBIDE DINITRATE 20 MG PO TABS
20.0000 mg | ORAL_TABLET | Freq: Three times a day (TID) | ORAL | 0 refills | Status: DC
  Filled 2021-12-24: qty 42, 14d supply, fill #0

## 2021-12-24 MED ORDER — HYDRALAZINE HCL 25 MG PO TABS: 25.0000 mg | ORAL_TABLET | Freq: Three times a day (TID) | ORAL | Status: DC

## 2021-12-24 MED ORDER — ISOSORBIDE DINITRATE 10 MG PO TABS: 20.0000 mg | ORAL_TABLET | Freq: Three times a day (TID) | ORAL | Status: DC

## 2021-12-24 MED ORDER — TORSEMIDE 100 MG PO TABS: 50.0000 mg | ORAL_TABLET | Freq: Every day | ORAL | 0 refills | Status: DC

## 2021-12-24 MED ORDER — ENSURE ENLIVE PO LIQD: 237.0000 mL | Freq: Two times a day (BID) | ORAL | 0 refills | Status: AC

## 2021-12-24 MED ORDER — ISOSORBIDE DINITRATE 20 MG PO TABS: 20.0000 mg | ORAL_TABLET | Freq: Three times a day (TID) | ORAL | 0 refills | Status: DC

## 2021-12-24 MED ORDER — HYDRALAZINE HCL 25 MG PO TABS
25.0000 mg | ORAL_TABLET | Freq: Three times a day (TID) | ORAL | 0 refills | Status: DC
  Filled 2021-12-24: qty 42, 14d supply, fill #0

## 2021-12-24 MED ORDER — TORSEMIDE 100 MG PO TABS
50.0000 mg | ORAL_TABLET | Freq: Every day | ORAL | 0 refills | Status: DC
  Filled 2021-12-24: qty 7, 14d supply, fill #0

## 2021-12-24 MED ORDER — HYDRALAZINE HCL 25 MG PO TABS: 25.0000 mg | ORAL_TABLET | Freq: Three times a day (TID) | ORAL | 0 refills | Status: DC

## 2021-12-24 NOTE — Progress Notes (Signed)
Heart Failure Navigator Progress Note ? ?Assessed for Heart & Vascular TOC clinic readiness.  ?Patient does not meet criteria due to no benefit from HF TOC, . Patient poor functional status., follows with the [place]. ? ? ? ?[name], BSN, RN ?Heart Failure Nurse Navigator ?[phone]   ?

## 2021-12-24 NOTE — Progress Notes (Signed)
Physical Therapy Treatment ?Patient Details ?Name: Tim Bradley ?MRN: [id number] ?DOB: 09/02/1951 ?Today's Date: 12/24/2021 ? ? ?History of Present Illness 71 y/o male admitted 3/25 with worsening SOB and CHF exacerbation. PMHx: HTN, DM, CHF, CKD, seizures, and substance abuse. ? ?  ?PT Comments  ? ? Pt very pleasant and willing to attempt anything requested of him. Pt with good transition with rolling and +2 assist to transition to sitting. With use of Stedy pt able to stand for a max of 35 sec with cues for hip and trunk extension without additional support. Pt educated for HEP and benefit of Stedy for home use if financially possible. Will continue to follow.  ?   ?Recommendations for follow up therapy are one component of a multi-disciplinary discharge planning process, led by the attending physician.  Recommendations may be updated based on patient status, additional functional criteria and insurance authorization. ? ?Follow Up Recommendations ? Home health PT ?  ?  ?Assistance Recommended at Discharge Frequent or constant Supervision/Assistance  ?Patient can return home with the following Two people to help with walking and/or transfers;A lot of help with bathing/dressing/bathroom;Assistance with cooking/housework;Help with stairs or ramp for entrance;Assist for transportation ?  ?Equipment Recommendations ? Other (comment) (Stedy for transfer bed to chair)  ?  ?Recommendations for Other Services   ? ? ?  ?Precautions / Restrictions Precautions ?Precautions: Fall ?Restrictions ?Weight Bearing Restrictions: No  ?  ? ?Mobility ? Bed Mobility ?Overal bed mobility: Needs Assistance ?Bed Mobility: Rolling, Sidelying to Sit ?Rolling: Min assist ?Sidelying to sit: Mod assist, +2 for physical assistance ?  ?  ?  ?General bed mobility comments: min assist with rail to roll left with mod +2 assist to bring legs fully off bed and elevate trunk. initial mod assist for sitting balance EOB with bil UE support ?   ? ?Transfers ?Overall transfer level: Needs assistance ?  ?Transfers: Sit to/from Stand, Bed to chair/wheelchair/BSC ?Sit to Stand: From elevated surface, Mod assist, +2 physical assistance, Min guard ?  ?  ?  ?  ?  ?General transfer comment: pt with initial stand from bed to stedy with mod +2 assist to initiate rise. Once in stedy pt able to rise to standing x 8 additional trials with cues for sequence and safety from stedy pads with max standing trials of 35 and 30 sec respectively with other trials ranging from 3-12 sec. Transfer bed to chair via stedy ?Transfer via Lift Equipment: Stedy ? ?Ambulation/Gait ?  ?  ?  ?  ?  ?  ?  ?General Gait Details: unable ? ? ?Stairs ?  ?  ?  ?  ?  ? ? ?Wheelchair Mobility ?  ? ?Modified Rankin (Stroke Patients Only) ?  ? ? ?  ?Balance Overall balance assessment: Needs assistance ?Sitting-balance support: Feet supported, Bilateral upper extremity supported ?Sitting balance-Leahy Scale: Poor ?Sitting balance - Comments: mod assist without UE support, guarding with bil UE support ?Postural control: Posterior lean ?Standing balance support: Bilateral upper extremity supported, During functional activity ?Standing balance-Leahy Scale: Poor ?Standing balance comment: bil Ue support on stedy with knees blocked ?  ?  ?  ?  ?  ?  ?  ?  ?  ?  ?  ?  ? ?  ?Cognition Arousal/Alertness: Awake/alert ?Behavior During Therapy: [facility] for tasks assessed/performed ?Overall Cognitive Status: Within Functional Limits for tasks assessed ?  ?  ?  ?  ?  ?  ?  ?  ?  ?  ?  ?  ?  ?  ?  ?  ?  ?  ?  ? ?  ?  Exercises General Exercises - Lower Extremity ?Long Arc Quad: AAROM, Both, Seated, 10 reps ?Hip Flexion/Marching: AAROM, Both, 10 reps, Seated ? ?  ?General Comments   ?  ?  ? ?Pertinent Vitals/Pain Pain Assessment ?Pain Assessment: No/denies pain  ? ? ?Home Living   ?  ?  ?  ?  ?  ?  ?  ?  ?  ?   ?  ?Prior Function    ?  ?  ?   ? ?PT Goals (current goals can now be found in the care plan section) Progress  towards PT goals: Progressing toward goals ? ?  ?Frequency ? ? ? Min 3X/week ? ? ? ?  ?PT Plan Current plan remains appropriate  ? ? ?Co-evaluation   ?  ?  ?  ?  ? ?  ?AM-PAC PT "6 Clicks" Mobility   ?Outcome Measure ? Help needed turning from your back to your side while in a flat bed without using bedrails?: A Little ?Help needed moving from lying on your back to sitting on the side of a flat bed without using bedrails?: Total ?Help needed moving to and from a bed to a chair (including a wheelchair)?: Total ?Help needed standing up from a chair using your arms (e.g., wheelchair or bedside chair)?: Total ?Help needed to walk in hospital room?: Total ?Help needed climbing 3-5 steps with a railing? : Total ?6 Click Score: 8 ? ?  ?End of Session   ?Activity Tolerance: Patient tolerated treatment well ?Patient left: in chair;with call bell/phone within reach;with chair alarm set ?Nurse Communication: Mobility status;Need for lift equipment ?PT Visit Diagnosis: Unsteadiness on feet (R26.81);Muscle weakness (generalized) (M62.81) ?  ? ? ?Time: 1607-3710 ?PT Time Calculation (min) (ACUTE ONLY): 19 min ? ?Charges:  $Therapeutic Activity: 8-22 mins          ?          ? ?[name], PT ?Acute Rehabilitation Services ?Pager: [phone] ?Office: [phone] ? ? ? ?Srihari Shellhammer B Ossie Beltran ?12/24/2021, 12:56 PM ? ?

## 2021-12-24 NOTE — Progress Notes (Signed)
SATURATION QUALIFICATIONS: (This note is used to comply with regulatory documentation for home oxygen) ? ?Patient Saturations on Room Air at Rest = 95% ? ?Patient Saturations on Room Air while Ambulating = na ? ?Patient Saturations on na Liters of oxygen while Ambulating = na% ? ?Please briefly explain why patient needs home oxygen: na ?

## 2021-12-24 NOTE — Progress Notes (Signed)
PT Cancellation Note ? ?Patient Details ?Name: Tim Bradley ?MRN: [id number] ?DOB: Nov 30, 1950 ? ? ?Cancelled Treatment:    Reason Eval/Treat Not Completed: Patient at procedure or test/unavailable (pt leaving floor for procedure) ? ? ?Tim Bradley ?12/24/2021, 8:54 AM ?Oswin Griffith P, PT ?Acute Rehabilitation Services ?Pager: [phone] ?Office: [phone] ? ?

## 2021-12-24 NOTE — Progress Notes (Signed)
Occupational Therapy Treatment ?Patient Details ?Name: Tim Bradley ?MRN: [id number] ?DOB: 07/26/1951 ?Today's Date: 12/24/2021 ? ? ?History of present illness 71 y/o male admitted secondary to worsening SOB. Thought to be secondary to CHF exacerbation. PMH includes HTN, DM, CHF, CKD, seizures, and substance abuse. ?  ?OT comments ? Patient received in supine and agreeable to participate in OT.  Patient was mod to max assist to get to EOB with education on rail use.  Patient performed grooming seated on EOB with assistance for balance due to posterior leaning. Patient performed reaching and trunk exercises to challenge balance while on EOB with improved balance afterwards.  Transportation arrived during treatment and patient was assisted back to supine. Acute OT to continue to follow.   ? ?Recommendations for follow up therapy are one component of a multi-disciplinary discharge planning process, led by the attending physician.  Recommendations may be updated based on patient status, additional functional criteria and insurance authorization. ?   ?Follow Up Recommendations ? Home health OT (reports he want to return home and has DME)  ?  ?Assistance Recommended at Discharge Frequent or constant Supervision/Assistance  ?Patient can return home with the following ? A lot of help with walking and/or transfers;A lot of help with bathing/dressing/bathroom ?  ?Equipment Recommendations ? None recommended by OT  ?  ?Recommendations for Other Services   ? ?  ?Precautions / Restrictions Precautions ?Precautions: Fall ?Restrictions ?Weight Bearing Restrictions: No  ? ? ?  ? ?Mobility Bed Mobility ?Overal bed mobility: Needs Assistance ?Bed Mobility: Rolling, Supine to Sit, Sit to Supine ?Rolling: Mod assist ?  ?Supine to sit: Mod assist, Max assist ?Sit to supine: Mod assist, Max assist ?  ?General bed mobility comments: rolled in bed for bed pad placement.  Mod to max assit for supine to sitting and sit to supine with education  on rail use to assist ?  ? ?Transfers ?  ?  ?  ?  ?  ?  ?  ?  ?  ?  ?  ?  ?Balance Overall balance assessment: Needs assistance ?Sitting-balance support: Single extremity supported, Feet supported ?Sitting balance-Leahy Scale: Fair ?Sitting balance - Comments: min to mod assist to sit on EOB to perform grooming.  Able to maintain static sitting balance following trunk strengthening exercises ?Postural control: Posterior lean ?  ?  ?  ?  ?  ?  ?  ?  ?  ?  ?  ?  ?  ?  ?   ? ?ADL either performed or assessed with clinical judgement  ? ?ADL Overall ADL's : Needs assistance/impaired ?  ?  ?Grooming: Supervision/safety;Set up;Sitting;Wash/dry hands;Wash/dry face;Oral care ?Grooming Details (indicate cue type and reason): min assist for sitting balance while performing grooming seated on EOB ?  ?  ?  ?  ?  ?  ?  ?  ?  ?  ?  ?  ?  ?  ?  ?General ADL Comments: gooming performed seated on EOB with assistance for balance ?  ? ?Extremity/Trunk Assessment   ?  ?  ?  ?  ?  ? ?Vision   ?  ?  ?Perception   ?  ?Praxis   ?  ? ?Cognition Arousal/Alertness: Awake/alert ?Behavior During Therapy: [facility] for tasks assessed/performed ?Overall Cognitive Status: No family/caregiver present to determine baseline cognitive functioning ?  ?  ?  ?  ?  ?  ?  ?  ?  ?  ?  ?  ?  ?  ?  ?  ?  General Comments: followed directions well ?  ?  ?   ?Exercises Exercises: General Upper Extremity, Other exercises ?General Exercises - Upper Extremity ?Shoulder Flexion: AROM, Both, 10 reps, Seated ?Shoulder ABduction: AROM, Both, 10 reps, Seated ?Other Exercises ?Other Exercises: lateral and forward flexion exercises to address sitting balance ? ?  ?Shoulder Instructions   ? ? ?  ?General Comments    ? ? ?Pertinent Vitals/ Pain       Pain Assessment ?Pain Assessment: No/denies pain ? ?Home Living   ?  ?  ?  ?  ?  ?  ?  ?  ?  ?  ?  ?  ?  ?  ?  ?  ?  ?  ? ?  ?Prior Functioning/Environment    ?  ?  ?  ?   ? ?Frequency ? Min 2X/week  ? ? ? ? ?  ?Progress Toward  Goals ? ?OT Goals(current goals can now be found in the care plan section) ? Progress towards OT goals: Progressing toward goals ? ?Acute Rehab OT Goals ?Patient Stated Goal: go home ?OT Goal Formulation: With patient ?Time For Goal Achievement: 01/05/22 ?Potential to Achieve Goals: Good ?ADL Goals ?Pt Will Perform Upper Body Dressing: with min assist;sitting ?Pt Will Transfer to Toilet: with mod assist;stand pivot transfer;bedside commode ?Additional ADL Goal #1: Pt will maintain sitting balance at EOB with Supervision for 10 minutes in preparation for ADLs ?Additional ADL Goal #2: Pt will perform bed mobility with Min A in preparation for ADLs  ?Plan Discharge plan remains appropriate   ? ?Co-evaluation ? ? ?   ?  ?  ?  ?  ? ?  ?AM-PAC OT "6 Clicks" Daily Activity     ?Outcome Measure ? ? Help from another person eating meals?: A Little ?Help from another person taking care of personal grooming?: A Little ?Help from another person toileting, which includes using toliet, bedpan, or urinal?: A Lot ?Help from another person bathing (including washing, rinsing, drying)?: A Lot ?Help from another person to put on and taking off regular upper body clothing?: A Lot ?Help from another person to put on and taking off regular lower body clothing?: Total ?6 Click Score: 13 ? ?  ?End of Session   ? ?OT Visit Diagnosis: Other abnormalities of gait and mobility (R26.89);Muscle weakness (generalized) (M62.81);Unsteadiness on feet (R26.81) ?  ?Activity Tolerance Patient tolerated treatment well ?  ?Patient Left in bed;with call bell/phone within reach;with bed alarm set ?  ?Nurse Communication Mobility status;Need for lift equipment ?  ? ?   ? ?Time: 4081-4481 ?OT Time Calculation (min): 21 min ? ?Charges: OT General Charges ?$OT Visit: 1 Visit ?OT Treatments ?$Self Care/Home Management : 8-22 mins ? ?Lodema Hong, OTA ?Acute Rehabilitation Services  ?Pager [phone] ?Office [phone] ? ? ?[place] ?12/24/2021, 10:06 AM ?

## 2021-12-24 NOTE — Plan of Care (Signed)
?  Problem: Clinical Measurements: ?Goal: Respiratory complications will improve ?Outcome: Progressing ?  ?Problem: Elimination: ?Goal: Will not experience complications related to urinary retention ?Outcome: Progressing ?  ?Problem: Safety: ?Goal: Ability to remain free from injury will improve ?Outcome: Progressing ?  ?

## 2021-12-24 NOTE — Progress Notes (Signed)
Pt has orders to be discharged. Discharge instructions given and pt has no additional questions at this time. Medication regimen reviewed and pt educated. Meds sent to [facility]. Pt verbalized understanding and has no additional questions. Telemetry box removed. IV removed and site in good condition. Pt stable and waiting for transportation via ambulance. ?

## 2021-12-24 NOTE — Discharge Summary (Addendum)
Physician Discharge Summary   Patient: Tim Bradley MRN: [id number] DOB: [birth year]  Admit date:     12/21/2021  Discharge date: 12/24/21  Discharge Physician: Coralie Keens   PCP: [facility]   Recommendations at discharge:    Patient will continue diuresis with torsemide Added hydralazine/ isosorbide combination Follow up renal function as outpatient.   I spoke over the phone with the patient's sister about patient's  condition, plan of care, prognosis and all questions were addressed.   Discharge Diagnoses: Principal Problem:   Acute on chronic systolic heart failure (HCC) Active Problems:   Acute kidney injury superimposed on chronic kidney disease (HCC)   Essential hypertension   Type 2 diabetes mellitus with hyperlipidemia (HCC)   BPH (benign prostatic hyperplasia)   Seizure disorder (HCC)   DVT (deep venous thrombosis) (HCC)   Class 1 obesity   DNR (do not resuscitate)  Resolved Problems:   * No resolved hospital problems. [facility] Course: Tim Bradley was admitted to the hospital with the working diagnosis of decompensated heart failure.   71 yo male with the past medical history of heart failure, chronic kidney disease, hypertension, type 2 diabetes mellitus, seizures and substance abuse (cocaine) who presented with dyspnea. Patient run out of his diuretics at home. He is living in a new apartment. Reported dyspnea, lower extremity edema, PND and orthopnea. The night before admission his dyspnea became acutely worse and he was brought to the hospital for further evaluation. On his initial physical examination his blood pressure was 149/86, HR 62, RR 14 and 02 saturation 98%. His lungs had no wheezing or rales, heart with S1 and S2 present and rhythmic with no gallops or rubs, abdomen not distended, positive lower extremity edema.   Na 138, K 4,2. CL 104 bicarbonate 23, glucose 200, bun 64 cr 3,0 BNP 197 High sensitive troponin 17 and 19 Wbc 5,7 hgb  11.4 hct 36.3 plt 239 Sars covid 19 negative  Chest radiograph with cardiomegaly with mild hilar vascular congestion.   EKG 82 bpm, left axis deviation, left bundle brunch block, sinus rhythm with poor R wave progression, with no significant ST segment or T wave changes.   Patient has been placed on diuretic therapy with good toleration.  [place] lower extremities showed positive DVT on the right and he was placed on anticoagulation with good toleration.   Patient will continue torsemide 50 mg daily at home and will follow up renal function as outpatient.   Assessment and Plan: * Acute on chronic systolic heart failure (HCC) Patient was admitted to the cardiac ward and received aggressive diuresis with IV furosemide, negative fluid balanced was achieved, with significant improvement in his symptoms.   Recent echocardiogram from 10/2021 showed moderate LVH, systolic function with EF 40 to 45%, with moderate hypokinesis of the inferior segments, RV with preserved systolic function, no significant valvular disease.   Patient will continue heart failure management with carvedilol and added hydralazine/ isosorbide combination for after load reduction.  His torsemide will be renewed, continue same dose of 50 mg daily. Follow up as outpatient.    Acute kidney injury superimposed on chronic kidney disease (HCC) Stage 4 CKD   Patient tolerated well diuresis with furosemide, his peak cr reached 3.31 At the time of discharge his renal function showed a serum cr of 3,17 with K at 4,1 and serum bicarbonate at 27.  Plan to continue diuresis with torsemide and follow up renal function as outpatient.  Essential hypertension Patient tolerated well combination of hydralazine and isosorbide, holding on RAS inhibition due to low GFR.  Follow as outpatient.   Type 2 diabetes mellitus with hyperlipidemia (HCC) His glucose remained well controlled, at the time of his discharge his fasting glucose is  120. During his hospitalization he received insulin sliding scale for glucose cover and monitoring.   BPH (benign prostatic hyperplasia) No signs of urinary retention.  Continue with tamsulosin and finesteride.    Seizure disorder (HCC) No active seizures, continue with keppra and divalproex.   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 Karin Lieu, no vascular intervention is indicated since he is minimally ambulatory at baseline  Continue anticoagulation with apixaban.  Class 1 obesity Calculated BMI is 32.3         Consultants: none  Procedures performed: none   Disposition:  home  Diet recommendation:  Cardiac diet DISCHARGE MEDICATION: Allergies as of 12/24/2021       Reactions   Trazodone And Nefazodone    Family reports seizures from this medication        Medication List     STOP taking these medications    aspirin 81 MG EC tablet       TAKE these medications    apixaban 5 MG Tabs tablet Commonly known as: ELIQUIS Take 1 tablet (5 mg total) by mouth 2 (two) times daily.   atorvastatin 80 MG tablet Commonly known as: LIPITOR Take 80 mg by mouth daily.   carvedilol 6.25 MG tablet Commonly known as: COREG Take 6.25 mg by mouth 2 (two) times daily with a meal.   divalproex 250 MG DR tablet Commonly known as: DEPAKOTE Take 500 mg by mouth 2 (two) times daily.   DULoxetine 60 MG capsule Commonly known as: CYMBALTA Take 60 mg by mouth daily.   famotidine 20 MG tablet Commonly known as: PEPCID Take 20 mg by mouth daily as needed for heartburn or indigestion.   feeding supplement Liqd Take 237 mLs by mouth 2 (two) times daily between meals.   finasteride 5 MG tablet Commonly known as: PROSCAR Take 5 mg by mouth daily.   hydrALAZINE 25 MG tablet Commonly known as: APRESOLINE Take 1 tablet (25 mg total) by mouth 3 (three) times daily.   isosorbide dinitrate 20 MG  tablet Commonly known as: ISORDIL Take 1 tablet (20 mg total) by mouth 3 (three) times daily.   levETIRAcetam 500 MG tablet Commonly known as: KEPPRA Take 500 mg by mouth 2 (two) times daily.   tamsulosin 0.4 MG Caps capsule Commonly known as: FLOMAX Take 1 capsule (0.4 mg total) by mouth daily after supper.   torsemide 100 MG tablet Commonly known as: DEMADEX Take 0.5 tablets (50 mg total) by mouth daily.        Follow-up Information     [facility] Follow up.   Specialty: General Practice Why: Please follow up in a week. Contact information: [address] [place] [address] [phone]         Wendall Stade, MD .   Specialty: Cardiology Contact information: [phone] [address] [place] [address] [phone]                Discharge Exam:  BP 119/84   Pulse 83   Temp 99.2 F (37.3 C) (Oral)   Resp 20   Ht 6\' 2"  (1.88 m)   Wt 118.2 kg  SpO2 96%   BMI 33.46 kg/m   Patient is feeling better, dyspnea and edema have improved  Neurology awake and alert ENT with no pallor Cardiovascular with S1 and S2 present and rhythmic with no gallops or murmurs, no rubs No JVD Trace lower extremity edema pretibial, more on right than left Respiratory with no rales or wheezing Abdomen protuberant but not distended.   Condition at discharge: stable  The results of significant diagnostics from this hospitalization (including imaging, microbiology, ancillary and laboratory) are listed below for reference.   Imaging Studies: DG Chest Port 1 View  Result Date: 12/21/2021 CLINICAL DATA:  Shortness of breath EXAM: PORTABLE CHEST 1 VIEW COMPARISON:  09/28/2020 FINDINGS: The heart size and mediastinal contours are within normal limits. Both lungs are clear. The visualized skeletal structures are unremarkable. Aortic calcifications are again noted. IMPRESSION: No acute abnormality noted. Electronically Signed   By: Alcide Clever M.D.    On: 12/21/2021 02:06   VAS [place] LOWER EXTREMITY VENOUS (DVT)  Result Date: 12/22/2021  Lower Venous DVT Study Patient Name:  Tim Bradley  Date of Exam:   12/21/2021 Medical Rec #: [id number]      Accession #:    [id number] Date of Birth: 06-28-1951      Patient Gender: M Patient Age:   64 years Exam Location:  [facility] Procedure:      VAS [place] LOWER EXTREMITY VENOUS (DVT) Referring Phys: Victorino Dike YATES --------------------------------------------------------------------------------  Indications: Swelling.  Risk Factors: DVT RLE 09/2020. Anticoagulation: Eliquis. Limitations: Body habitus and poor ultrasound/tissue interface. Comparison Study: Previous exam on 09/30/20 was positive for DVT in RLE - EIV,                   CFV, SFJ, FV, PFV, POPV, & PTV Performing Technologist: Jody Hill RVT, RDMS  Examination Guidelines: A complete evaluation includes B-mode imaging, spectral Doppler, color Doppler, and power Doppler as needed of all accessible portions of each vessel. Bilateral testing is considered an integral part of a complete examination. Limited examinations for reoccurring indications may be performed as noted. The reflux portion of the exam is performed with the patient in reverse Trendelenburg.  +--------+---------------+---------+-----------+----------+--------------------+ RIGHT   CompressibilityPhasicitySpontaneityPropertiesThrombus Aging       +--------+---------------+---------+-----------+----------+--------------------+ CFV     Partial        Yes      Yes                  Chronic              +--------+---------------+---------+-----------+----------+--------------------+ SFJ     Partial                                      Chronic              +--------+---------------+---------+-----------+----------+--------------------+ FV Prox Partial        Yes      Yes                  Chronic               +--------+---------------+---------+-----------+----------+--------------------+ FV Mid  None           No       No                   Age Indeterminate    +--------+---------------+---------+-----------+----------+--------------------+ FV      None  No       No                   Age Indeterminate    Distal                                                                    +--------+---------------+---------+-----------+----------+--------------------+ PFV                                                  Not seen             +--------+---------------+---------+-----------+----------+--------------------+ POP     None           No       No                   Age Indeterminate    +--------+---------------+---------+-----------+----------+--------------------+ PTV     Full                                                              +--------+---------------+---------+-----------+----------+--------------------+ PERO    Full                                                              +--------+---------------+---------+-----------+----------+--------------------+ EIV                    Yes      Yes                  Patent by                                                                 color/doppler        +--------+---------------+---------+-----------+----------+--------------------+   +----+---------------+---------+-----------+----------+--------------+ LEFTCompressibilityPhasicitySpontaneityPropertiesThrombus Aging +----+---------------+---------+-----------+----------+--------------+ CFV Full           Yes      Yes                                 +----+---------------+---------+-----------+----------+--------------+     Summary: RIGHT: - Findings consistent with age indeterminate deep vein thrombosis involving the right femoral vein (mid & distal), and right popliteal vein. 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 - No cystic structure found in the popliteal fossa.  LEFT: - No evidence of common femoral vein obstruction.  *See table(s) above for measurements and  observations. Electronically signed by Gerarda Fraction on 12/22/2021 at 10:50:58 AM.    Final     Microbiology: Results for orders placed or performed during the hospital encounter of 12/21/21  Resp Panel by RT-PCR (Flu A&B, Covid) Nasopharyngeal Swab     Status: None   Collection Time: 12/21/21  6:03 AM   Specimen: Nasopharyngeal Swab; Nasopharyngeal(NP) swabs in vial transport medium  Result Value Ref Range Status   SARS Coronavirus 2 by RT PCR NEGATIVE NEGATIVE Final    Comment: (NOTE) SARS-CoV-2 target nucleic acids are NOT DETECTED.  The SARS-CoV-2 RNA is generally detectable in upper respiratory specimens during the acute phase of infection. The lowest concentration of SARS-CoV-2 viral copies this assay can detect is 138 copies/mL. A negative result does not preclude SARS-Cov-2 infection and should not be used as the sole basis for treatment or other patient management decisions. A negative result may occur with  improper specimen collection/handling, submission of specimen other than nasopharyngeal swab, presence of viral mutation(s) within the areas targeted by this assay, and inadequate number of viral copies(<138 copies/mL). A negative result must be combined with clinical observations, patient history, and epidemiological information. The expected result is Negative.  Fact Sheet for Patients:  BloggerCourse.com  Fact Sheet for Healthcare Providers:  SeriousBroker.it  This test is no t yet approved or cleared by the [place] FDA and  has been authorized for detection and/or diagnosis of SARS-CoV-2 by FDA under an Emergency Use Authorization (EUA). This EUA will remain  in effect  (meaning this test can be used) for the duration of the COVID-19 declaration under Section 564(b)(1) of the Act, 21 U.S.C.section 360bbb-3(b)(1), unless the authorization is terminated  or revoked sooner.       Influenza A by PCR NEGATIVE NEGATIVE Final   Influenza B by PCR NEGATIVE NEGATIVE Final    Comment: (NOTE) The Xpert Xpress SARS-CoV-2/FLU/RSV plus assay is intended as an aid in the diagnosis of influenza from Nasopharyngeal swab specimens and should not be used as a sole basis for treatment. Nasal washings and aspirates are unacceptable for Xpert Xpress SARS-CoV-2/FLU/RSV testing.  Fact Sheet for Patients: BloggerCourse.com  Fact Sheet for Healthcare Providers: SeriousBroker.it  This test is not yet approved or cleared by the [place] FDA and has been authorized for detection and/or diagnosis of SARS-CoV-2 by FDA under an Emergency Use Authorization (EUA). This EUA will remain in effect (meaning this test can be used) for the duration of the COVID-19 declaration under Section 564(b)(1) of the Act, 21 U.S.C. section 360bbb-3(b)(1), unless the authorization is terminated or revoked.  Performed at [facility], [address] [place], [place] [address]     Labs: CBC: Recent Labs  Lab 12/21/21 0111 12/22/21 0251  WBC 5.7 4.8  NEUTROABS 2.5  --   HGB 11.4* 11.1*  HCT 36.3* 35.5*  MCV 92.6 91.7  PLT 239 234   Basic Metabolic Panel: Recent Labs  Lab 12/21/21 0111 12/22/21 0251 12/23/21 0436 12/24/21 0530  NA 138 138 136 136  K 4.2 4.1 4.1 4.1  CL 104 101 99 98  CO2 23 29 29 27   GLUCOSE 200* 135* 140* 120*  BUN 64* 61* 70* 73*  CREATININE 3.01* 2.90* 3.31* 3.17*  CALCIUM 9.4 9.3 9.0 9.1  MG  --   --  2.1 2.3   Liver Function Tests: No results for input(s): AST, ALT, ALKPHOS, BILITOT, PROT, ALBUMIN in the last 168 hours. CBG: Recent Labs  Lab 12/23/21 1107 12/23/21  1517 12/23/21 2118  12/24/21 0626 12/24/21 1141  GLUCAP 201* 170* 145* 105* 159*    Discharge time spent: greater than 30 minutes.  Signed: Coralie Keens, MD [facility] 12/24/2021

## 2021-12-24 NOTE — Plan of Care (Signed)
?  Problem: Education: ?Goal: Ability to demonstrate management of disease process will improve ?Outcome: Progressing ?  ?Problem: Activity: ?Goal: Capacity to carry out activities will improve ?Outcome: Progressing ?  ?Problem: Cardiac: ?Goal: Ability to achieve and maintain adequate cardiopulmonary perfusion will improve ?Outcome: Progressing ?  ?

## 2021-12-24 NOTE — TOC Transition Note (Addendum)
Transition of Care (TOC) - CM/SW Discharge Note ? ? ?Patient Details  ?Name: Tim Bradley ?MRN: [id number] ?Date of Birth: Feb 23, 1951 ? ?Transition of Care (TOC) CM/SW Contact:  ?[name], RN ?Phone Number: ?12/24/2021, 12:51 PM ? ? ?Clinical Narrative:    ?NCM spoke with patient at bedside , sister [name] was on the phone , she states he will be coming to address [address], [place], [place] [address].  Her phone is [phone].  She states he is a [place] patient and he goes to [place].  NCM left message for return call to [place] with the [facility] and [place] with the [place].  Patient will need ambulance transport home today, will schedule for 2 pm.  Per Sister she will call [facility] ,he has an aide with them as well.  Dr. [name] is his PCP at the [facility]. Patient states he has been using oxygen at home at night , but a doctor did not prescribed this for him.  NCM asked staff RN to check a room air at rest saturation on patient and notified MD as well. He is 95 % room air at rest.  He does not qualify for oxygen. He will need ambulance transport , sister said to schedule for 2 pm transport.  NCM received call from Social Worker, Lennox Grumbles [phone] ext [phone].  NCM informed her of the HHPT,HHOT needed and that it has been faxed to PCP office.  She confirmed it is the correct fax number, also informed her that agency is [place].    Patient can not take w/chair on ambulance, his sister states she will come to pick up the w/chair.   ? ? ?Final next level of care: [place] ?Barriers to Discharge: No Barriers Identified ? ? ?Patient Goals and CMS Choice ?Patient states their goals for this hospitalization and ongoing recovery are:: return home ?CMS Medicare.gov Compare Post Acute Care list provided to:: Patient Represenative (must comment) ?Choice offered to / list presented to : Adult Children ? ?Discharge Placement ?  ?           ?  ?  ?  ?  ? ?Discharge Plan and  Services ?  ?Discharge Planning Services: CM Consult ?Post Acute Care Choice: Home Health          ?  ?DME Agency: NA ?  ?  ?  ?HH Arranged: PT, OT ?[place] Agency: [place] ?Date HH Agency Contacted: 12/24/21 ?Time [place]: [id number] ?Representative spoke with at [place]: [name] ? ?Social Determinants of Health (SDOH) Interventions ?  ? ? ?Readmission Risk Interventions ? ?  10/01/2020  ? 12:53 PM 07/24/2019  ? 11:09 AM  ?Readmission Risk Prevention Plan  ?Transportation Screening Complete Complete  ?PCP or Specialist Appt within 3-5 Days  Complete  ?[place] or Home Care Consult  Complete  ?Social Work Consult for [place] Planning/Counseling  Complete  ?Palliative Care Screening  Not Applicable  ?Medication Review Press photographer) Complete Complete  ?PCP or Specialist appointment within 3-5 days of discharge Complete   ?[place] or Home Care Consult Complete   ?SW Recovery Care/Counseling Consult Complete   ?Palliative Care Screening Complete   ?[place] Patient Refused   ? ? ? ? ? ?

## 2021-12-30 ENCOUNTER — Other Ambulatory Visit: Payer: Self-pay

## 2021-12-30 ENCOUNTER — Encounter (HOSPITAL_COMMUNITY): Payer: Self-pay | Admitting: Oncology

## 2021-12-30 ENCOUNTER — Emergency Department (HOSPITAL_COMMUNITY)
Admission: EM | Admit: 2021-12-30 | Discharge: 2021-12-30 | Disposition: A | Discharge status: Home / Self Care | Payer: No Typology Code available for payment source | Attending: Emergency Medicine | Admitting: Emergency Medicine

## 2021-12-30 ENCOUNTER — Emergency Department (HOSPITAL_COMMUNITY): Payer: No Typology Code available for payment source

## 2021-12-30 DIAGNOSIS — R7989 Other specified abnormal findings of blood chemistry: Secondary | ICD-10-CM | POA: Diagnosis not present

## 2021-12-30 DIAGNOSIS — E1122 Type 2 diabetes mellitus with diabetic chronic kidney disease: Secondary | ICD-10-CM | POA: Diagnosis not present

## 2021-12-30 DIAGNOSIS — R569 Unspecified convulsions: Secondary | ICD-10-CM | POA: Insufficient documentation

## 2021-12-30 DIAGNOSIS — Z79899 Other long term (current) drug therapy: Secondary | ICD-10-CM | POA: Insufficient documentation

## 2021-12-30 DIAGNOSIS — I130 Hypertensive heart and chronic kidney disease with heart failure and stage 1 through stage 4 chronic kidney disease, or unspecified chronic kidney disease: Secondary | ICD-10-CM | POA: Diagnosis not present

## 2021-12-30 DIAGNOSIS — N189 Chronic kidney disease, unspecified: Secondary | ICD-10-CM | POA: Insufficient documentation

## 2021-12-30 DIAGNOSIS — R600 Localized edema: Secondary | ICD-10-CM | POA: Insufficient documentation

## 2021-12-30 DIAGNOSIS — I509 Heart failure, unspecified: Secondary | ICD-10-CM | POA: Diagnosis not present

## 2021-12-30 DIAGNOSIS — Z7901 Long term (current) use of anticoagulants: Secondary | ICD-10-CM | POA: Insufficient documentation

## 2021-12-30 LAB — COMPREHENSIVE METABOLIC PANEL
ALT: 12 U/L (ref 0–44)
AST: 16 U/L (ref 15–41)
Albumin: 4 g/dL (ref 3.5–5.0)
Alkaline Phosphatase: 54 U/L (ref 38–126)
Anion gap: 8 (ref 5–15)
BUN: 65 mg/dL — ABNORMAL HIGH (ref 8–23)
CO2: 29 mmol/L (ref 22–32)
Calcium: 9.5 mg/dL (ref 8.9–10.3)
Chloride: 99 mmol/L (ref 98–111)
Creatinine, Ser: 2.77 mg/dL — ABNORMAL HIGH (ref 0.61–1.24)
GFR, Estimated: 23 mL/min — ABNORMAL LOW (ref >60)
Glucose, Bld: 167 mg/dL — ABNORMAL HIGH (ref 70–99)
Potassium: 4.1 mmol/L (ref 3.5–5.1)
Sodium: 136 mmol/L (ref 135–145)
Total Bilirubin: 0.4 mg/dL (ref 0.3–1.2)
Total Protein: 8 g/dL (ref 6.5–8.1)

## 2021-12-30 LAB — CBC WITH DIFFERENTIAL/PLATELET
Abs Immature Granulocytes: 0.02 10*3/uL (ref 0.00–0.07)
Basophils Absolute: 0 10*3/uL (ref 0.0–0.1)
Basophils Relative: 1 %
Eosinophils Absolute: 0 10*3/uL (ref 0.0–0.5)
Eosinophils Relative: 1 %
HCT: 38 % — ABNORMAL LOW (ref 39.0–52.0)
Hemoglobin: 11.9 g/dL — ABNORMAL LOW (ref 13.0–17.0)
Immature Granulocytes: 0 %
Lymphocytes Relative: 26 %
Lymphs Abs: 1.4 10*3/uL (ref 0.7–4.0)
MCH: 28.6 pg (ref 26.0–34.0)
MCHC: 31.3 g/dL (ref 30.0–36.0)
MCV: 91.3 fL (ref 80.0–100.0)
Monocytes Absolute: 0.5 10*3/uL (ref 0.1–1.0)
Monocytes Relative: 10 %
Neutro Abs: 3.3 10*3/uL (ref 1.7–7.7)
Neutrophils Relative %: 62 %
Platelets: 230 10*3/uL (ref 150–400)
RBC: 4.16 MIL/uL — ABNORMAL LOW (ref 4.22–5.81)
RDW: 14.1 % (ref 11.5–15.5)
WBC: 5.3 10*3/uL (ref 4.0–10.5)
nRBC: 0 % (ref 0.0–0.2)

## 2021-12-30 LAB — VALPROIC ACID LEVEL: Valproic Acid Lvl: 45 ug/mL — ABNORMAL LOW (ref 50.0–100.0)

## 2021-12-30 LAB — BRAIN NATRIURETIC PEPTIDE: B Natriuretic Peptide: 274.3 pg/mL — ABNORMAL HIGH (ref 0.0–100.0)

## 2021-12-30 MED ORDER — LEVETIRACETAM 750 MG PO TABS: 750.0000 mg | ORAL_TABLET | Freq: Two times a day (BID) | ORAL | 3 refills | Status: DC

## 2021-12-30 MED ORDER — APIXABAN 5 MG PO TABS
5.0000 mg | ORAL_TABLET | Freq: Two times a day (BID) | ORAL | 3 refills | Status: DC
Start: 2021-12-30 — End: 2022-01-10

## 2021-12-30 MED ORDER — ISOSORBIDE DINITRATE 20 MG PO TABS: 20.0000 mg | ORAL_TABLET | Freq: Once | ORAL | Status: DC

## 2021-12-30 MED ORDER — DIVALPROEX SODIUM 500 MG PO DR TAB
500.0000 mg | DELAYED_RELEASE_TABLET | Freq: Once | ORAL | Status: AC
  Administered 2021-12-30: 500 mg via ORAL
  Filled 2021-12-30: qty 1

## 2021-12-30 MED ORDER — CARVEDILOL 6.25 MG PO TABS: 6.2500 mg | ORAL_TABLET | Freq: Two times a day (BID) | ORAL | 3 refills | Status: DC

## 2021-12-30 MED ORDER — CARVEDILOL 3.125 MG PO TABS
6.2500 mg | ORAL_TABLET | Freq: Once | ORAL | Status: AC
  Administered 2021-12-30: 6.25 mg via ORAL
  Filled 2021-12-30: qty 2

## 2021-12-30 MED ORDER — DEXTROSE 50 % IV SOLN
INTRAVENOUS | Status: AC
  Filled 2021-12-30: qty 50

## 2021-12-30 MED ORDER — APIXABAN 5 MG PO TABS: 5.0000 mg | ORAL_TABLET | Freq: Once | ORAL | Status: DC

## 2021-12-30 MED ORDER — HYDRALAZINE HCL 25 MG PO TABS
25.0000 mg | ORAL_TABLET | Freq: Once | ORAL | Status: AC
  Administered 2021-12-30: 25 mg via ORAL
  Filled 2021-12-30: qty 1

## 2021-12-30 MED ORDER — DULOXETINE HCL 60 MG PO CPEP
60.0000 mg | ORAL_CAPSULE | Freq: Every day | ORAL | 3 refills | Status: DC
Start: 2021-12-30 — End: 2022-01-10

## 2021-12-30 MED ORDER — TORSEMIDE 20 MG PO TABS: 50.0000 mg | ORAL_TABLET | Freq: Once | ORAL | Status: DC

## 2021-12-30 MED ORDER — LEVETIRACETAM 500 MG PO TABS
500.0000 mg | ORAL_TABLET | Freq: Once | ORAL | Status: AC
  Administered 2021-12-30: 500 mg via ORAL
  Filled 2021-12-30: qty 1

## 2021-12-30 NOTE — Discharge Instructions (Addendum)
A new prescription for carvedilol, cymbalta, Eliquis and Keppra were sent to your pharmacy at [facility] on [address].  At this time we will hold the Flomax, finasteride, hydralazine, Imdur.  Plan on following up with your kidney doctor as planned later this week.  You will need to increase your torsemide to 1 full tablet for the next 3 days and then you can go back to half a tablet.  Otherwise your blood test are improved today from what they had been in the past.  If you have worsening symptoms, worsening swelling or issues with shortness of breath you can return to the emergency room. ? ?

## 2021-12-30 NOTE — ED Triage Notes (Signed)
Pt bib [facility] from home s/p focal seizure.  [name] reported b/l arm shaking.  Not post ictal during triage. ?

## 2021-12-30 NOTE — ED Provider Notes (Signed)
?[facility] DEPT ?Provider Note ? ? ?CSN: [id number] ?Arrival date & time: 12/30/21  0701 ? ?  ? ?History ? ?Chief Complaint  ?Patient presents with  ? Seizures  ? ? ?Tim Bradley is a 71 y.o. male. ? ?71y/o male with hx of heart failure, chronic kidney disease, hypertension, type 2 diabetes mellitus, seizures and substance abuse (cocaine) presenting today with EMS after witnessed seizure by his sister this morning.  Patient does have a history of seizure disorder and takes Depakote and Keppra and has been compliant with his medications.  He recently was discharged from rehab on Saturday and has been at home since then.  His sister witnessed these episodes today per EMS.  She reported that he has a history of focal seizures and he had shaking of his upper extremities for about a minute today early in the morning and then a second time before she called EMS.  He is at his baseline now.  Patient reports he does not really remember these events very well.  His only complaint was feeling short of breath which he reports has been ongoing for some time when he wakes up in the morning he feels short of breath and he has to breathe in through his nose and out through his mouth and sit up and then it usually improves.  Patient was just recently admitted to the hospital for CHF exacerbation requiring diuresis.  His sister reports that the swelling in his legs is the best its been in quite some time.  He is still taking his diuretic medication.  He was found to have a DVT during his most recent hospitalization in which appeared to be more chronic and he has been on Eliquis for over a week.  He currently reports he is feeling better.  His shortness of breath is now almost completely resolved.  He has no chest pain.  He denies cough, congestion, fever, abdominal pain, nausea, vomiting.  He has been eating well.  His sister did report that he had been taken off of finasteride while he was in the rehab  facility and it did come in the mail yesterday he took a dose and she is thinking that may have provoked the seizures as it does have a warning for this on the label. ? ?The history is provided by the patient and the EMS personnel.  ?Seizures ? ?  ? ?Home Medications ?Prior to Admission medications   ?Medication Sig Start Date End Date Taking? Authorizing Provider  ?levETIRAcetam (KEPPRA) 750 MG tablet Take 1 tablet (750 mg total) by mouth 2 (two) times daily. 12/30/21  Yes Blanchie Dessert, MD  ?apixaban (ELIQUIS) 5 MG TABS tablet Take 1 tablet (5 mg total) by mouth 2 (two) times daily. 12/30/21   Blanchie Dessert, MD  ?atorvastatin (LIPITOR) 80 MG tablet Take 80 mg by mouth daily.    [provider]  ?carvedilol (COREG) 6.25 MG tablet Take 1 tablet (6.25 mg total) by mouth 2 (two) times daily with a meal. 12/30/21   Blanchie Dessert, MD  ?divalproex (DEPAKOTE) 250 MG DR tablet Take 500 mg by mouth 2 (two) times daily.    [provider]  ?DULoxetine (CYMBALTA) 60 MG capsule Take 1 capsule (60 mg total) by mouth daily. 12/30/21   Blanchie Dessert, MD  ?famotidine (PEPCID) 20 MG tablet Take 20 mg by mouth daily as needed for heartburn or indigestion.    [provider]  ?feeding supplement (ENSURE ENLIVE / ENSURE PLUS)  LIQD Take 237 mLs by mouth 2 (two) times daily between meals. 12/24/21 01/23/22  Arrien, Jimmy Picket, MD  ?finasteride (PROSCAR) 5 MG tablet Take 5 mg by mouth daily.    [provider]  ?hydrALAZINE (APRESOLINE) 25 MG tablet Take 1 tablet (25 mg total) by mouth 3 (three) times daily. 12/24/21 01/23/22  Arrien, Jimmy Picket, MD  ?isosorbide dinitrate (ISORDIL) 20 MG tablet Take 1 tablet (20 mg total) by mouth 3 (three) times daily. 12/24/21 01/23/22  Arrien, Jimmy Picket, MD  ?tamsulosin (FLOMAX) 0.4 MG CAPS capsule Take 1 capsule (0.4 mg total) by mouth daily after supper. 01/27/20   Deno Etienne, DO  ?torsemide (DEMADEX) 100 MG tablet Take 0.5 tablets (50 mg total)  by mouth daily. 12/24/21 01/23/22  Arrien, Jimmy Picket, MD  ?   ? ?Allergies    ?Trazodone and nefazodone   ? ?Review of Systems   ?Review of Systems  ?Neurological:  Positive for seizures.  ? ?Physical Exam ?Updated Vital Signs ?BP (!) 148/85   Pulse 63   Temp 97.9 ?F (36.6 ?C) (Oral)   Resp 18   Ht 6\' 2"  (1.88 m)   Wt 118 kg   SpO2 98%   BMI 33.40 kg/m?  ?Physical Exam ?Vitals and nursing note reviewed.  ?Constitutional:   ?   General: He is not in acute distress. ?   Appearance: He is well-developed.  ?HENT:  ?   Head: Normocephalic and atraumatic.  ?   Mouth/Throat:  ?   Mouth: Mucous membranes are moist.  ?Eyes:  ?   Conjunctiva/sclera: Conjunctivae normal.  ?   Pupils: Pupils are equal, round, and reactive to light.  ?Cardiovascular:  ?   Rate and Rhythm: Normal rate and regular rhythm.  ?   Heart sounds: No murmur heard. ?Pulmonary:  ?   Effort: Pulmonary effort is normal. No respiratory distress.  ?   Breath sounds: Normal breath sounds. No wheezing or rales.  ?   Comments: Occasional pursed lip breathing.  Resolves after the patient is sat upright ?Abdominal:  ?   General: There is no distension.  ?   Palpations: Abdomen is soft.  ?   Tenderness: There is no abdominal tenderness. There is no guarding or rebound.  ?Musculoskeletal:     ?   General: No tenderness. Normal range of motion.  ?   Cervical back: Normal range of motion and neck supple.  ?   Comments: Trace edema in bilateral lower extremities.  Right leg appears mildly more swollen than the left  ?Skin: ?   General: Skin is warm and dry.  ?   Findings: No erythema or rash.  ?Neurological:  ?   Mental Status: He is alert and oriented to person, place, and time. Mental status is at baseline.  ?Psychiatric:     ?   Mood and Affect: Mood normal.     ?   Behavior: Behavior normal.  ? ? ?ED Results / Procedures / Treatments   ?Labs ?(all labs ordered are listed, but only abnormal results are displayed) ?Labs Reviewed  ?CBC WITH  DIFFERENTIAL/PLATELET - Abnormal; Notable for the following components:  ?    Result Value  ? RBC 4.16 (*)   ? Hemoglobin 11.9 (*)   ? HCT 38.0 (*)   ? All other components within normal limits  ?COMPREHENSIVE METABOLIC PANEL - Abnormal; Notable for the following components:  ? Glucose, Bld 167 (*)   ? BUN 65 (*)   ? Creatinine,  Ser 2.77 (*)   ? GFR, Estimated 23 (*)   ? All other components within normal limits  ?BRAIN NATRIURETIC PEPTIDE - Abnormal; Notable for the following components:  ? B Natriuretic Peptide 274.3 (*)   ? All other components within normal limits  ?VALPROIC ACID LEVEL - Abnormal; Notable for the following components:  ? Valproic Acid Lvl 45 (*)   ? All other components within normal limits  ? ? ?EKG ?None ?ED ECG REPORT ? ? Date: 12/30/2021 ? Rate: 74 ? Rhythm: normal sinus rhythm ? QRS Axis: normal ? Intervals: normal ? ST/T Wave abnormalities: nonspecific ST/T changes ? Conduction Disutrbances:left bundle branch block ? Narrative Interpretation:  ? Old EKG Reviewed: unchanged ? ?I have personally reviewed the EKG tracing and agree with the computerized printout as noted. ? ? ?Radiology ?DG Chest Port 1 View ? ?Result Date: 12/30/2021 ?CLINICAL DATA:  Shortness of breath EXAM: PORTABLE CHEST 1 VIEW COMPARISON:  12/21/2021 FINDINGS: The heart size and mediastinal contours are within normal limits. No focal airspace consolidation, pleural effusion, or pneumothorax. The visualized skeletal structures are unremarkable. IMPRESSION: No active disease. Electronically Signed   By: Davina Poke D.O.   On: 12/30/2021 08:09   ? ?Procedures ?Procedures  ? ? ?Medications Ordered in ED ?Medications  ?apixaban (ELIQUIS) tablet 5 mg (has no administration in time range)  ?isosorbide dinitrate (ISORDIL) tablet 20 mg (has no administration in time range)  ?torsemide (DEMADEX) tablet 50 mg (has no administration in time range)  ?carvedilol (COREG) tablet 6.25 mg (6.25 mg Oral Given 12/30/21 0740)  ?divalproex  (DEPAKOTE) DR tablet 500 mg (500 mg Oral Given 12/30/21 0740)  ?hydrALAZINE (APRESOLINE) tablet 25 mg (25 mg Oral Given 12/30/21 0740)  ?levETIRAcetam (KEPPRA) tablet 500 mg (500 mg Oral Given 12/30/21 0740)  ? ? ?ED Course/ Medical

## 2022-01-10 ENCOUNTER — Emergency Department (HOSPITAL_COMMUNITY)
Admission: EM | Admit: 2022-01-10 | Discharge: 2022-01-10 | Disposition: A | Discharge status: Home / Self Care | Payer: No Typology Code available for payment source | Attending: Emergency Medicine | Admitting: Emergency Medicine

## 2022-01-10 ENCOUNTER — Encounter (HOSPITAL_COMMUNITY): Payer: Self-pay

## 2022-01-10 ENCOUNTER — Emergency Department (HOSPITAL_COMMUNITY): Payer: No Typology Code available for payment source

## 2022-01-10 ENCOUNTER — Other Ambulatory Visit: Payer: Self-pay

## 2022-01-10 DIAGNOSIS — N189 Chronic kidney disease, unspecified: Secondary | ICD-10-CM | POA: Insufficient documentation

## 2022-01-10 DIAGNOSIS — Z760 Encounter for issue of repeat prescription: Secondary | ICD-10-CM | POA: Insufficient documentation

## 2022-01-10 DIAGNOSIS — R600 Localized edema: Secondary | ICD-10-CM | POA: Insufficient documentation

## 2022-01-10 DIAGNOSIS — F419 Anxiety disorder, unspecified: Secondary | ICD-10-CM | POA: Insufficient documentation

## 2022-01-10 DIAGNOSIS — R0602 Shortness of breath: Secondary | ICD-10-CM | POA: Diagnosis present

## 2022-01-10 DIAGNOSIS — I509 Heart failure, unspecified: Secondary | ICD-10-CM | POA: Insufficient documentation

## 2022-01-10 DIAGNOSIS — E1122 Type 2 diabetes mellitus with diabetic chronic kidney disease: Secondary | ICD-10-CM | POA: Diagnosis not present

## 2022-01-10 DIAGNOSIS — I130 Hypertensive heart and chronic kidney disease with heart failure and stage 1 through stage 4 chronic kidney disease, or unspecified chronic kidney disease: Secondary | ICD-10-CM | POA: Diagnosis not present

## 2022-01-10 DIAGNOSIS — Z7901 Long term (current) use of anticoagulants: Secondary | ICD-10-CM | POA: Diagnosis not present

## 2022-01-10 LAB — CBC WITH DIFFERENTIAL/PLATELET
Abs Immature Granulocytes: 0.07 10*3/uL (ref 0.00–0.07)
Basophils Absolute: 0 10*3/uL (ref 0.0–0.1)
Basophils Relative: 1 %
Eosinophils Absolute: 0.3 10*3/uL (ref 0.0–0.5)
Eosinophils Relative: 5 %
HCT: 36.7 % — ABNORMAL LOW (ref 39.0–52.0)
Hemoglobin: 11.7 g/dL — ABNORMAL LOW (ref 13.0–17.0)
Immature Granulocytes: 1 %
Lymphocytes Relative: 30 %
Lymphs Abs: 1.7 10*3/uL (ref 0.7–4.0)
MCH: 29 pg (ref 26.0–34.0)
MCHC: 31.9 g/dL (ref 30.0–36.0)
MCV: 90.8 fL (ref 80.0–100.0)
Monocytes Absolute: 0.7 10*3/uL (ref 0.1–1.0)
Monocytes Relative: 13 %
Neutro Abs: 2.9 10*3/uL (ref 1.7–7.7)
Neutrophils Relative %: 50 %
Platelets: 233 10*3/uL (ref 150–400)
RBC: 4.04 MIL/uL — ABNORMAL LOW (ref 4.22–5.81)
RDW: 13.9 % (ref 11.5–15.5)
WBC: 5.6 10*3/uL (ref 4.0–10.5)
nRBC: 0 % (ref 0.0–0.2)

## 2022-01-10 LAB — COMPREHENSIVE METABOLIC PANEL
ALT: 13 U/L (ref 0–44)
AST: 15 U/L (ref 15–41)
Albumin: 3.8 g/dL (ref 3.5–5.0)
Alkaline Phosphatase: 51 U/L (ref 38–126)
Anion gap: 7 (ref 5–15)
BUN: 85 mg/dL — ABNORMAL HIGH (ref 8–23)
CO2: 29 mmol/L (ref 22–32)
Calcium: 9.3 mg/dL (ref 8.9–10.3)
Chloride: 102 mmol/L (ref 98–111)
Creatinine, Ser: 2.84 mg/dL — ABNORMAL HIGH (ref 0.61–1.24)
GFR, Estimated: 23 mL/min — ABNORMAL LOW (ref >60)
Glucose, Bld: 172 mg/dL — ABNORMAL HIGH (ref 70–99)
Potassium: 4.2 mmol/L (ref 3.5–5.1)
Sodium: 138 mmol/L (ref 135–145)
Total Bilirubin: 0.7 mg/dL (ref 0.3–1.2)
Total Protein: 7.9 g/dL (ref 6.5–8.1)

## 2022-01-10 LAB — TROPONIN I (HIGH SENSITIVITY): Troponin I (High Sensitivity): 12 ng/L (ref <18)

## 2022-01-10 LAB — BRAIN NATRIURETIC PEPTIDE: B Natriuretic Peptide: 173.1 pg/mL — ABNORMAL HIGH (ref 0.0–100.0)

## 2022-01-10 MED ORDER — LEVETIRACETAM 500 MG PO TABS
750.0000 mg | ORAL_TABLET | Freq: Once | ORAL | Status: AC
  Administered 2022-01-10: 750 mg via ORAL
  Filled 2022-01-10: qty 1

## 2022-01-10 MED ORDER — ATORVASTATIN CALCIUM 80 MG PO TABS: 80.0000 mg | ORAL_TABLET | Freq: Every day | ORAL | 0 refills | Status: AC

## 2022-01-10 MED ORDER — CARVEDILOL 6.25 MG PO TABS: 6.2500 mg | ORAL_TABLET | Freq: Two times a day (BID) | ORAL | 3 refills | Status: DC

## 2022-01-10 MED ORDER — DIVALPROEX SODIUM ER 250 MG PO TB24: 500.0000 mg | ORAL_TABLET | Freq: Every day | ORAL | 0 refills | Status: DC

## 2022-01-10 MED ORDER — DULOXETINE HCL 60 MG PO CPEP
60.0000 mg | ORAL_CAPSULE | Freq: Every day | ORAL | 3 refills | Status: DC
Start: 2022-01-10 — End: 2022-01-31

## 2022-01-10 MED ORDER — APIXABAN 5 MG PO TABS
5.0000 mg | ORAL_TABLET | Freq: Two times a day (BID) | ORAL | 3 refills | Status: AC
Start: 2022-01-10 — End: ?

## 2022-01-10 MED ORDER — LEVETIRACETAM 750 MG PO TABS: 750.0000 mg | ORAL_TABLET | Freq: Two times a day (BID) | ORAL | 3 refills | Status: DC

## 2022-01-10 MED ORDER — ALPRAZOLAM 0.25 MG PO TABS
0.2500 mg | ORAL_TABLET | Freq: Once | ORAL | Status: AC
  Administered 2022-01-10: 0.25 mg via ORAL
  Filled 2022-01-10: qty 1

## 2022-01-10 MED ORDER — DIVALPROEX SODIUM 500 MG PO DR TAB
500.0000 mg | DELAYED_RELEASE_TABLET | Freq: Once | ORAL | Status: AC
Start: 2022-01-10 — End: 2022-01-10
  Administered 2022-01-10: 500 mg via ORAL
  Filled 2022-01-10: qty 1

## 2022-01-10 MED ORDER — APIXABAN 5 MG PO TABS
5.0000 mg | ORAL_TABLET | Freq: Once | ORAL | Status: DC
  Filled 2022-01-10: qty 1

## 2022-01-10 NOTE — Discharge Instructions (Signed)
You have been seen and discharged from the emergency department.  Your medications have been sent to the [facility].  These are available for pickup.  Follow-up with your primary provider for further evaluation and further care. Take home medications as prescribed. If you have any worsening symptoms or further concerns for your health please return to an emergency department for further evaluation. ?

## 2022-01-10 NOTE — Progress Notes (Signed)
Transition of Care [facility]) - Emergency Department Mini Assessment ? ? ?Patient Details  ?Name: Tim Bradley ?MRN: [id number] ?Date of Birth: 1951/03/25 ? ?Transition of Care (TOC) CM/SW Contact:    ?Tim Kaufman, RN ?Phone Number: ?01/10/2022, 12:30 PM ? ? ?Clinical Narrative: ?Patient presents to [facility] ED for c/o shortness of breath. RNCM received TOC consult regarding patient inability to get seizure medication filled.  ? ? ?ED Mini Assessment: ?RNCM spoke  with Tim Bradley the [facility] regarding patient unable to get seizure medications. Tim Bradley indicates patient has seen PCP on 12/27/21 and has a neurology appt. on 01/17/22 at 1520 to discuss seizure medications. Patient has to be seen by neurology before [facility] will fill the RX. This RNCM questioned if he can get a refill until patient goes to his appointment on 01/17/22. Tim Bradley then referred to [facility] with [facility] pharmacy [phone] ext [phone].    ? ? ?RNCM spoke with patient at bedside who indicated it's best to speak with his sister. Patient called sister Tim Bradley while RNCM at bedside. [name] indicates patient was on Depakote however a PA at [facility] changed patient from Depakote to [place] however the [facility] will not fill the medications.     ? ?RNCM spoke with Tim Bradley with [facility] who states if EDP is willing to write for seizure medication. Tim Bradley advised the following medications are on the [facility] with once daily formulation: ?Levetiracetam 250mg , 500mg , 1000mg  (no DR)  and  Depakote 250 ER, 500 ER. ?Tim Bradley indicated patient has not been on generic Keppra since 2020.  ?  ?EDP notified.  ?  ?TOC will continue to follow.  ?  ? ?Patient Contact and Communications ? Tim Bradley (sister): [phone]  ?  ?   ?Admission diagnosis:  SOB ?Patient Active Problem List  ? Diagnosis Date Noted  ? Class 1 obesity 12/22/2021  ? Acute on chronic systolic heart failure ([place]) 12/21/2021  ? DNR (do not resuscitate) 12/21/2021  ? DVT (deep  venous thrombosis) ([place]) 12/21/2021  ? Substance induced mood disorder ([place]) 10/27/2020  ? Right leg DVT ([place]) 09/30/2020  ? Seizure disorder ([place]) 09/29/2020  ? Cardiomyopathy ([place]) 09/28/2020  ? AKI (acute kidney injury) ([place]) 09/28/2020  ? Pressure injury of skin 05/03/2020  ? Acute encephalopathy 05/02/2020  ? Diarrhea 05/02/2020  ? Acute combined systolic and diastolic congestive heart failure ([place]) 04/22/2020  ? Essential hypertension 04/11/2020  ? Leg edema 04/11/2020  ? Dyspnea 04/11/2020  ? Acute on chronic congestive heart failure ([place]) 04/11/2020  ? BPH (benign prostatic hyperplasia) 04/11/2020  ? Acute kidney injury superimposed on chronic kidney disease ([place]) 04/11/2020  ? Generalized weakness 04/11/2020  ? Seizure ([place]) 07/21/2019  ? Cocaine abuse ([place])   ? Constipation 08/22/2018  ? Gastritis 08/20/2018  ? Heme positive stool   ? Acute blood loss anemia secondary to massive gastric ulcer   ? Hyponatremia   ? Abscess, gluteal, left 03/26/2017  ? Type 2 diabetes mellitus with hyperlipidemia ([place]) 03/26/2017  ? Adrenal nodule ([place]) 03/26/2017  ? Closed fracture of body of thoracic vertebra ([place]) 03/26/2017  ? Spinal stenosis of lumbar region 03/26/2017  ? ?PCP:  [place]:   ?[id number] [place], [place] - [address] ?[place] ?[place] [address] ?Phone: [phone] Fax: [phone] ? ?[place], [place] - [address] ?[address] ?[place] [address] ?  Phone: [phone] Fax: [phone] ? ?[place], [place]. ?[address] ?[place] [address] ?Phone: [phone] Fax: [phone] ? ?[facility] ?[address] ?[place] [address] ?Phone: [phone] Fax: [phone] ? ?[id number] [place], [place] [address] [place] ?[address] ?[place] [address] ?Phone:  [phone] Fax: [phone] ?  ?

## 2022-01-10 NOTE — ED Triage Notes (Signed)
Pt BIB EMS from home. Pt reports waking up this morning and feeling unable to catch his breath since 2am. Pt reports SHOB with exertion. Hx of anxiety and out of anxiety medications x2 weeks.  ? ?93-96% RA ?BP 160/82 ?HR 90 ? ?

## 2022-01-10 NOTE — ED Provider Notes (Signed)
?[facility] DEPT ?Provider Note ? ? ?CSN: [id number] ?Arrival date & time: 01/10/22  6789 ? ?  ? ?History ? ?Chief Complaint  ?Patient presents with  ? Shortness of Breath  ? ? ?Tim Bradley is a 71 y.o. male. ? ?HPI ? ?71 year old male with past medical history of HTN, HLD, CHF, DM, CKD, seizures presents emergency department with concern for shortness of breath.  Patient had an admission last month for CHF exacerbation.  He was seen a couple weeks ago in the ER for breakthrough seizure.  Patient and sister report that he has had a difficult time obtaining his medication refills from the [facility].  He missed office appointments due to his hospitalization here and even the refills have been sent from our ED physicians the [facility] "will not fill his prescriptions".  Because of this he has been intermittently noncompliant with his seizure medication.  He has been compliant with his BP/cholesterol medication and the sister believes compliant with Eliquis.  No recent fever, productive cough, acute swelling of the lower extremities.  No active chest or back pain.  Patient is also endorsing intermittent anxiety and difficulty sleeping at night. ? ?Home Medications ?Prior to Admission medications   ?Medication Sig Start Date End Date Taking? Authorizing Provider  ?apixaban (ELIQUIS) 5 MG TABS tablet Take 1 tablet (5 mg total) by mouth 2 (two) times daily. 12/30/21   Blanchie Dessert, MD  ?atorvastatin (LIPITOR) 80 MG tablet Take 80 mg by mouth daily.    [provider]  ?carvedilol (COREG) 6.25 MG tablet Take 1 tablet (6.25 mg total) by mouth 2 (two) times daily with a meal. 12/30/21   Blanchie Dessert, MD  ?divalproex (DEPAKOTE) 250 MG DR tablet Take 500 mg by mouth 2 (two) times daily.    [provider]  ?DULoxetine (CYMBALTA) 60 MG capsule Take 1 capsule (60 mg total) by mouth daily. 12/30/21   Blanchie Dessert, MD  ?famotidine (PEPCID) 20 MG tablet Take 20 mg by mouth daily as needed  for heartburn or indigestion.    [provider]  ?feeding supplement (ENSURE ENLIVE / ENSURE PLUS) LIQD Take 237 mLs by mouth 2 (two) times daily between meals. 12/24/21 01/23/22  Arrien, Jimmy Picket, MD  ?finasteride (PROSCAR) 5 MG tablet Take 5 mg by mouth daily.    [provider]  ?hydrALAZINE (APRESOLINE) 25 MG tablet Take 1 tablet (25 mg total) by mouth 3 (three) times daily. 12/24/21 01/23/22  Arrien, Jimmy Picket, MD  ?isosorbide dinitrate (ISORDIL) 20 MG tablet Take 1 tablet (20 mg total) by mouth 3 (three) times daily. 12/24/21 01/23/22  Arrien, Jimmy Picket, MD  ?levETIRAcetam (KEPPRA) 750 MG tablet Take 1 tablet (750 mg total) by mouth 2 (two) times daily. 12/30/21   Blanchie Dessert, MD  ?tamsulosin (FLOMAX) 0.4 MG CAPS capsule Take 1 capsule (0.4 mg total) by mouth daily after supper. 01/27/20   Deno Etienne, DO  ?torsemide (DEMADEX) 100 MG tablet Take 0.5 tablets (50 mg total) by mouth daily. 12/24/21 01/23/22  Arrien, Jimmy Picket, MD  ?   ? ?Allergies    ?Trazodone and nefazodone   ? ?Review of Systems   ?Review of Systems  ?Constitutional:  Positive for fatigue. Negative for fever.  ?Respiratory:  Positive for shortness of breath.   ?Cardiovascular:  Negative for chest pain, palpitations and leg swelling.  ?Gastrointestinal:  Negative for abdominal pain, diarrhea and vomiting.  ?Skin:  Negative for rash.  ?Neurological:  Negative for headaches.  ?Psychiatric/Behavioral:  Positive for sleep disturbance. The patient is nervous/anxious.   ? ?Physical Exam ?Updated Vital Signs ?BP (!) 151/92   Pulse 89   Temp 97.9 ?F (36.6 ?C) (Oral)   Resp 13   SpO2 98%  ?Physical Exam ?Vitals and nursing note reviewed.  ?Constitutional:   ?   General: He is not in acute distress. ?   Appearance: Normal appearance. He is not diaphoretic.  ?HENT:  ?   Head: Normocephalic.  ?   Mouth/Throat:  ?   Mouth: Mucous membranes are moist.  ?Cardiovascular:  ?   Rate and Rhythm: Normal rate.  ?Pulmonary:   ?   Effort: Pulmonary effort is normal. No tachypnea, accessory muscle usage or respiratory distress.  ?   Breath sounds: Decreased breath sounds present.  ?Abdominal:  ?   Palpations: Abdomen is soft.  ?   Tenderness: There is no abdominal tenderness.  ?Musculoskeletal:  ?   Right lower leg: Edema present.  ?   Left lower leg: Edema present.  ?Skin: ?   General: Skin is warm.  ?Neurological:  ?   Mental Status: He is alert and oriented to person, place, and time. Mental status is at baseline.  ?Psychiatric:     ?   Mood and Affect: Mood is anxious.  ? ? ?ED Results / Procedures / Treatments   ?Labs ?(all labs ordered are listed, but only abnormal results are displayed) ?Labs Reviewed  ?CBC WITH DIFFERENTIAL/PLATELET  ?COMPREHENSIVE METABOLIC PANEL  ?BRAIN NATRIURETIC PEPTIDE  ?TROPONIN I (HIGH SENSITIVITY)  ? ? ?EKG ?None ? ?Radiology ?No results found. ? ?Procedures ?Procedures  ? ? ?Medications Ordered in ED ?Medications  ?levETIRAcetam (KEPPRA) tablet 750 mg (has no administration in time range)  ?divalproex (DEPAKOTE) DR tablet 500 mg (has no administration in time range)  ?apixaban (ELIQUIS) tablet 5 mg (has no administration in time range)  ? ? ?ED Course/ Medical Decision Making/ A&P ?  ?                        ?Medical Decision Making ?Amount and/or Complexity of Data Reviewed ?Labs: ordered. ?Radiology: ordered. ? ?Risk ?Prescription drug management. ? ? ?71 year old male presents emergency department for what appears to be a medication issue and refill.  He is also endorsing some acute on chronic shortness of breath especially with exertion.  Report from the patient and his sister Tim Bradley is that the [facility] pharmacy has refills for his medications but refused to refill them due to the patient missing outpatient follow-up. ? ?Chart review shows that a couple weeks ago one of our ER physicians [facility] at the request of the sister.  However these medications were too expensive so they were  not filled.  Patient has reportedly been intermittently compliant with his medications.  No reports of seizure/seizure-like activity.  Work-up here in the department is baseline with no acute findings.  Doubt ACS, no chest pain, no findings of CHF, doubt PE, no hypoxia. ? ?Worked with our pharmacist here and transition of care [place].  We have been in contact with the [facility].  They ensure is that if we send refills they can fill the prescriptions for the patient.  We relayed this information to his Sister Tim Bradley.  She is requesting transfer to the [facility] for medical admission.  However the patient does not have a medical indication for admission.  Patient has a follow-up appointment in 3 days next week at  the [facility].  I have sent over his prescriptions and refills to the [facility] with some modifications per the pharmacist request.  Patient and sister Tim Bradley are aware that these medications are ready for pickup.  Tim Bradley is unable to pick up her brother however if we provide transport, [place] her home she can meet him there to help him inside.  Transition of care  Hulan Amato is aware and will attempt to arrange this.  Otherwise patient is stable for discharge from a medical standpoint. ? ? ? ? ? ? ? ?Final Clinical Impression(s) / ED Diagnoses ?Final diagnoses:  ?None  ? ? ?Rx / DC Orders ?ED Discharge Orders   ? ? None  ? ?  ? ? ?  ?Lorelle Gibbs, DO ?01/10/22 1624 ? ?

## 2022-01-10 NOTE — Progress Notes (Signed)
RNCM received inbound call from patient's sister [name] requesting this RNCM call SW Lamount Cranker [phone] to have patient transferred to the [facility] hospital in [place]. This RNCM advised patient does not meet medical need to transfer to another hospital as he is not inpt, he is currently in the ED. Patient's sister insist this RNCM call to speak with [facility] SW Dorris Singh and [facility] patient transportation coordinator.  ?This RNCM explained to [facility] that seizure medications are being prescribed by EDP so that patient can be controlled until [facility] neurology appointment on 01/17/22 at 1320. ? ?TOC will continue to follow. ?

## 2022-01-10 NOTE — Progress Notes (Addendum)
RNCM spoke with Tim Bradley [facility] SW who advised patient's sister called to request patient be placed inpatient and request medical transfer to the [facility] in [place]. This RNCM advised patient does not have a medical need to transfer hospital to hospital. SW Tim Bradley agreed transfer needs to be a medical need. SW advised patient receives 44 hours of HHA services with [facility] ph# [phone] and receives skilled services from [facility] for OT, [place] advised patient's sister states he lives with her however the address on file is different than the address in [facility] records. Patient resides in an apartment.  ?Per chart review this patient has been seen in the [facility] ED three times in the last month since being discharged from SNF. Per [facility] SW Tim Bradley patient's sister requested the patient be discharged from SNF due to patient not happy and wants to be near 64 year old mother.   ? ?RNCM called [facility] however received a message that phone number was invalid. This RNCM left a voicemail message for [facility] [phone] and left call back information.  ?RNCM called [facility] [phone] and received no answer unable to leave  a voicemail.  ? ?EDP notified. [facility] has been called, patient is 7th on the list for pick up.  ? ?Patient's sister Tim Bradley [phone]) needs to be notified when patient is in route to receive patient at home.   ?

## 2022-01-10 NOTE — TOC Transition Note (Signed)
Transition of Care (TOC) - CM/SW Discharge Note ? ? ?Patient Details  ?Name: Tim Bradley ?MRN: [id number] ?Date of Birth: 1950-11-26 ? ?Transition of Care (TOC) CM/SW Contact:  ?Siddalee Vanderheiden B Eldred Sooy, LCSWA ?Phone Number: ?01/10/2022, 8:25 PM ? ? ?Clinical Narrative:    ? ?CSW received call from [facility] stating they were on the way to pickup pt. CSW contacted pt's sister to advise [facility] would be arriving shortly.  ? ?  ?  ? ? ?Patient Goals and CMS Choice ?  ?  ?  ? ?Discharge Placement ?  ?           ?  ?  ?  ?  ? ?Discharge Plan and Services ?  ?  ?           ?  ?  ?  ?  ?  ?  ?  ?  ?  ?  ? ?Social Determinants of Health (SDOH) Interventions ?  ? ? ?Readmission Risk Interventions ? ?  10/01/2020  ? 12:53 PM 07/24/2019  ? 11:09 AM  ?Readmission Risk Prevention Plan  ?Transportation Screening Complete Complete  ?PCP or Specialist Appt within 3-5 Days  Complete  ?[place] or Home Care Consult  Complete  ?Social Work Consult for [place] Planning/Counseling  Complete  ?Palliative Care Screening  Not Applicable  ?Medication Review Press photographer) Complete Complete  ?PCP or Specialist appointment within 3-5 days of discharge Complete   ?[place] or Home Care Consult Complete   ?SW Recovery Care/Counseling Consult Complete   ?Palliative Care Screening Complete   ?[place] Patient Refused   ? ? ? ? ? ?

## 2022-01-10 NOTE — ED Notes (Signed)
Sister pearl notified pt is leaving with [facility] ?

## 2022-01-17 ENCOUNTER — Emergency Department (HOSPITAL_COMMUNITY)
Admission: EM | Admit: 2022-01-17 | Discharge: 2022-01-17 | Disposition: A | Discharge status: Home / Self Care | Payer: No Typology Code available for payment source | Attending: Emergency Medicine | Admitting: Emergency Medicine

## 2022-01-17 ENCOUNTER — Emergency Department (HOSPITAL_COMMUNITY): Payer: No Typology Code available for payment source

## 2022-01-17 ENCOUNTER — Encounter (HOSPITAL_COMMUNITY): Payer: Self-pay

## 2022-01-17 ENCOUNTER — Other Ambulatory Visit: Payer: Self-pay

## 2022-01-17 DIAGNOSIS — R410 Disorientation, unspecified: Secondary | ICD-10-CM | POA: Diagnosis not present

## 2022-01-17 DIAGNOSIS — N189 Chronic kidney disease, unspecified: Secondary | ICD-10-CM | POA: Diagnosis not present

## 2022-01-17 DIAGNOSIS — Z7901 Long term (current) use of anticoagulants: Secondary | ICD-10-CM | POA: Insufficient documentation

## 2022-01-17 DIAGNOSIS — I509 Heart failure, unspecified: Secondary | ICD-10-CM | POA: Insufficient documentation

## 2022-01-17 DIAGNOSIS — Z79899 Other long term (current) drug therapy: Secondary | ICD-10-CM | POA: Insufficient documentation

## 2022-01-17 DIAGNOSIS — R0602 Shortness of breath: Secondary | ICD-10-CM | POA: Insufficient documentation

## 2022-01-17 DIAGNOSIS — R600 Localized edema: Secondary | ICD-10-CM | POA: Diagnosis not present

## 2022-01-17 DIAGNOSIS — E1122 Type 2 diabetes mellitus with diabetic chronic kidney disease: Secondary | ICD-10-CM | POA: Insufficient documentation

## 2022-01-17 DIAGNOSIS — I130 Hypertensive heart and chronic kidney disease with heart failure and stage 1 through stage 4 chronic kidney disease, or unspecified chronic kidney disease: Secondary | ICD-10-CM | POA: Diagnosis not present

## 2022-01-17 DIAGNOSIS — R0902 Hypoxemia: Secondary | ICD-10-CM | POA: Insufficient documentation

## 2022-01-17 LAB — COMPREHENSIVE METABOLIC PANEL
ALT: 13 U/L (ref 0–44)
AST: 14 U/L — ABNORMAL LOW (ref 15–41)
Albumin: 3.6 g/dL (ref 3.5–5.0)
Alkaline Phosphatase: 48 U/L (ref 38–126)
Anion gap: 11 (ref 5–15)
BUN: 80 mg/dL — ABNORMAL HIGH (ref 8–23)
CO2: 23 mmol/L (ref 22–32)
Calcium: 9.6 mg/dL (ref 8.9–10.3)
Chloride: 101 mmol/L (ref 98–111)
Creatinine, Ser: 3.13 mg/dL — ABNORMAL HIGH (ref 0.61–1.24)
GFR, Estimated: 20 mL/min — ABNORMAL LOW (ref >60)
Glucose, Bld: 182 mg/dL — ABNORMAL HIGH (ref 70–99)
Potassium: 4.1 mmol/L (ref 3.5–5.1)
Sodium: 135 mmol/L (ref 135–145)
Total Bilirubin: 0.8 mg/dL (ref 0.3–1.2)
Total Protein: 7.4 g/dL (ref 6.5–8.1)

## 2022-01-17 LAB — URINALYSIS, ROUTINE W REFLEX MICROSCOPIC
Bilirubin Urine: NEGATIVE
Glucose, UA: NEGATIVE mg/dL
Hgb urine dipstick: NEGATIVE
Ketones, ur: NEGATIVE mg/dL
Nitrite: NEGATIVE
Protein, ur: 30 mg/dL — AB
Specific Gravity, Urine: 1.009 (ref 1.005–1.030)
WBC, UA: >50 WBC/hpf — ABNORMAL HIGH (ref 0–5)
pH: 6 (ref 5.0–8.0)

## 2022-01-17 LAB — CBC WITH DIFFERENTIAL/PLATELET
Abs Immature Granulocytes: 0.03 10*3/uL (ref 0.00–0.07)
Basophils Absolute: 0 10*3/uL (ref 0.0–0.1)
Basophils Relative: 1 %
Eosinophils Absolute: 0.2 10*3/uL (ref 0.0–0.5)
Eosinophils Relative: 3 %
HCT: 35.6 % — ABNORMAL LOW (ref 39.0–52.0)
Hemoglobin: 11.4 g/dL — ABNORMAL LOW (ref 13.0–17.0)
Immature Granulocytes: 1 %
Lymphocytes Relative: 29 %
Lymphs Abs: 1.4 10*3/uL (ref 0.7–4.0)
MCH: 28.9 pg (ref 26.0–34.0)
MCHC: 32 g/dL (ref 30.0–36.0)
MCV: 90.1 fL (ref 80.0–100.0)
Monocytes Absolute: 0.6 10*3/uL (ref 0.1–1.0)
Monocytes Relative: 13 %
Neutro Abs: 2.7 10*3/uL (ref 1.7–7.7)
Neutrophils Relative %: 53 %
Platelets: 225 10*3/uL (ref 150–400)
RBC: 3.95 MIL/uL — ABNORMAL LOW (ref 4.22–5.81)
RDW: 13.9 % (ref 11.5–15.5)
WBC: 5 10*3/uL (ref 4.0–10.5)
nRBC: 0 % (ref 0.0–0.2)

## 2022-01-17 LAB — BRAIN NATRIURETIC PEPTIDE: B Natriuretic Peptide: 109.6 pg/mL — ABNORMAL HIGH (ref 0.0–100.0)

## 2022-01-17 LAB — TROPONIN I (HIGH SENSITIVITY)
Troponin I (High Sensitivity): 12 ng/L (ref <18)
Troponin I (High Sensitivity): 15 ng/L (ref <18)

## 2022-01-17 LAB — LACTIC ACID, PLASMA: Lactic Acid, Venous: 1.6 mmol/L (ref 0.5–1.9)

## 2022-01-17 LAB — AMMONIA: Ammonia: 36 umol/L — ABNORMAL HIGH (ref 9–35)

## 2022-01-17 NOTE — Discharge Instructions (Signed)
Your history, exam today and work-up were overall reassuring.  Your cardiac enzymes were negative both times we checked them in your heart failure lab called a BNP was improved from before.  Your chest x-ray did not show pneumonia or new abnormalities and after over 6 and half hours of monitoring, your oxygen saturations remained normal on room air.  After having a shared decision-making conversation with you, your symptoms have remained resolved with no further confusion or shortness of breath.  You do not need oxygen right now and we did not find any other concerning findings.  We feel you are safe for discharge home to follow-up with the [facility] however if any symptoms change or worsen acutely, please return to the nearest emergency department. ?

## 2022-01-17 NOTE — ED Provider Notes (Addendum)
?[facility] ?Provider Note ? ? ?CSN: [id number] ?Arrival date & time: 01/17/22  0741 ? ?  ? ?History ? ?Chief Complaint  ?Patient presents with  ? Shortness of Breath  ? ? ?Tim Bradley is a 71 y.o. male. ? ?The history is provided by the patient and medical records. No language interpreter was used.  ?Shortness of Breath ?Severity:  Moderate ?Onset quality:  Gradual ?Duration:  1 day ?Timing:  Constant ?Progression:  Improving ?Chronicity:  New ?Context: not URI   ?Relieved by:  Oxygen ?Worsened by:  Nothing ?Ineffective treatments:  None tried ?Associated symptoms: no abdominal pain, no chest pain, no claudication, no cough, no diaphoresis, no fever, no headaches, no hemoptysis, no neck pain, no rash, no sputum production, no swollen glands, no vomiting and no wheezing   ? ?  ? ?Home Medications ?Prior to Admission medications   ?Medication Sig Start Date End Date Taking? Authorizing Provider  ?apixaban (ELIQUIS) 5 MG TABS tablet Take 1 tablet (5 mg total) by mouth 2 (two) times daily. 01/10/22   Horton, Alvin Critchley, DO  ?atorvastatin (LIPITOR) 80 MG tablet Take 1 tablet (80 mg total) by mouth daily. 01/10/22   Horton, Alvin Critchley, DO  ?carvedilol (COREG) 6.25 MG tablet Take 1 tablet (6.25 mg total) by mouth 2 (two) times daily with a meal. 01/10/22   Horton, Alvin Critchley, DO  ?divalproex (DEPAKOTE ER) 250 MG 24 hr tablet Take 2 tablets (500 mg total) by mouth daily. 01/10/22   Horton, Alvin Critchley, DO  ?divalproex (DEPAKOTE) 250 MG DR tablet Take 500 mg by mouth 2 (two) times daily.    [provider]  ?DULoxetine (CYMBALTA) 60 MG capsule Take 1 capsule (60 mg total) by mouth daily. 01/10/22   Horton, Alvin Critchley, DO  ?famotidine (PEPCID) 20 MG tablet Take 20 mg by mouth daily as needed for heartburn or indigestion.    [provider]  ?feeding supplement (ENSURE ENLIVE / ENSURE PLUS) LIQD Take 237 mLs by mouth 2 (two) times daily between meals. 12/24/21 01/23/22  Arrien,  Jimmy Picket, MD  ?finasteride (PROSCAR) 5 MG tablet Take 5 mg by mouth daily.    [provider]  ?hydrALAZINE (APRESOLINE) 25 MG tablet Take 1 tablet (25 mg total) by mouth 3 (three) times daily. 12/24/21 01/23/22  Arrien, Jimmy Picket, MD  ?isosorbide dinitrate (ISORDIL) 20 MG tablet Take 1 tablet (20 mg total) by mouth 3 (three) times daily. 12/24/21 01/23/22  Arrien, Jimmy Picket, MD  ?levETIRAcetam (KEPPRA) 750 MG tablet Take 1 tablet (750 mg total) by mouth 2 (two) times daily. 01/10/22   Horton, Alvin Critchley, DO  ?tamsulosin (FLOMAX) 0.4 MG CAPS capsule Take 1 capsule (0.4 mg total) by mouth daily after supper. 01/27/20   Deno Etienne, DO  ?torsemide (DEMADEX) 100 MG tablet Take 0.5 tablets (50 mg total) by mouth daily. 12/24/21 01/23/22  Arrien, Jimmy Picket, MD  ?   ? ?Allergies    ?Trazodone and nefazodone   ? ?Review of Systems   ?Review of Systems  ?Constitutional:  Negative for chills, diaphoresis, fatigue and fever.  ?HENT:  Negative for congestion.   ?Eyes:  Negative for visual disturbance.  ?Respiratory:  Positive for shortness of breath. Negative for cough, hemoptysis, sputum production, chest tightness, wheezing and stridor.   ?Cardiovascular:  Positive for leg swelling (bilaterally mild). Negative for chest pain, palpitations and claudication.  ?Gastrointestinal:  Negative for abdominal pain, constipation, diarrhea, nausea and vomiting.  ?Genitourinary:  Negative  for dysuria, flank pain and frequency.  ?Musculoskeletal:  Negative for back pain, neck pain and neck stiffness.  ?Skin:  Negative for rash and wound.  ?Neurological:  Negative for weakness, light-headedness, numbness and headaches.  ?Psychiatric/Behavioral:  Positive for confusion (resolved with oxygen). Negative for agitation.   ? ?Physical Exam ?Updated Vital Signs ?There were no vitals taken for this visit. ?Physical Exam ?Vitals and nursing note reviewed.  ?Constitutional:   ?   General: He is not in acute distress. ?    Appearance: He is well-developed. He is not ill-appearing, toxic-appearing or diaphoretic.  ?HENT:  ?   Head: Normocephalic and atraumatic.  ?Eyes:  ?   Extraocular Movements: Extraocular movements intact.  ?   Conjunctiva/sclera: Conjunctivae normal.  ?   Pupils: Pupils are equal, round, and reactive to light.  ?Cardiovascular:  ?   Rate and Rhythm: Normal rate and regular rhythm.  ?   Heart sounds: No murmur heard. ?Pulmonary:  ?   Effort: Pulmonary effort is normal. No tachypnea or respiratory distress.  ?   Breath sounds: Examination of the right-lower field reveals rales. Examination of the left-lower field reveals rales. Rales present. No wheezing or rhonchi.  ?Chest:  ?   Chest wall: No tenderness.  ?Abdominal:  ?   Palpations: Abdomen is soft.  ?   Tenderness: There is no abdominal tenderness.  ?Musculoskeletal:     ?   General: No swelling.  ?   Cervical back: Neck supple.  ?   Right lower leg: No tenderness. Edema present.  ?   Left lower leg: No tenderness. Edema present.  ?Skin: ?   General: Skin is warm and dry.  ?   Capillary Refill: Capillary refill takes less than 2 seconds.  ?   Findings: No erythema.  ?Neurological:  ?   General: No focal deficit present.  ?   Mental Status: He is alert.  ?Psychiatric:     ?   Mood and Affect: Mood normal.  ? ? ?ED Results / Procedures / Treatments   ?Labs ?(all labs ordered are listed, but only abnormal results are displayed) ?Labs Reviewed  ?CBC WITH DIFFERENTIAL/PLATELET - Abnormal; Notable for the following components:  ?    Result Value  ? RBC 3.95 (*)   ? Hemoglobin 11.4 (*)   ? HCT 35.6 (*)   ? All other components within normal limits  ?COMPREHENSIVE METABOLIC PANEL - Abnormal; Notable for the following components:  ? Glucose, Bld 182 (*)   ? BUN 80 (*)   ? Creatinine, Ser 3.13 (*)   ? AST 14 (*)   ? GFR, Estimated 20 (*)   ? All other components within normal limits  ?BRAIN NATRIURETIC PEPTIDE - Abnormal; Notable for the following components:  ? B  Natriuretic Peptide 109.6 (*)   ? All other components within normal limits  ?URINALYSIS, ROUTINE W REFLEX MICROSCOPIC - Abnormal; Notable for the following components:  ? APPearance HAZY (*)   ? Protein, ur 30 (*)   ? Leukocytes,Ua LARGE (*)   ? WBC, UA >50 (*)   ? Bacteria, UA FEW (*)   ? All other components within normal limits  ?AMMONIA - Abnormal; Notable for the following components:  ? Ammonia 36 (*)   ? All other components within normal limits  ?LACTIC ACID, PLASMA  ?LACTIC ACID, PLASMA  ?TROPONIN I (HIGH SENSITIVITY)  ?TROPONIN I (HIGH SENSITIVITY)  ? ? ?EKG ?EKG Interpretation ? ?Date/Time:  Friday January 17 2022 07:48:13  EDT ?Ventricular Rate:  80 ?PR Interval:  164 ?QRS Duration: 177 ?QT Interval:  443 ?QTC Calculation: 512 ?R Axis:   -76 ?Text Interpretation: Sinus rhythm Probable left atrial enlargement Nonspecific IVCD with LAD LVH with secondary repolarization abnormality Artifact in lead(s) I III aVL V1 when compared to prior, more artifact. No STEMI Confirmed by Antony Blackbird [phone]) on 01/17/2022 7:51:15 AM ? ?Radiology ?DG Chest 2 View ? ?Result Date: 01/17/2022 ?CLINICAL DATA:  sob, no cough EXAM: CHEST - 2 VIEW COMPARISON:  January 10, 2022. FINDINGS: Similar cardiomediastinal silhouette. No consolidation. No visible pleural effusions or pneumothorax. Cardiomediastinal silhouette is within normal limits. No acute osseous abnormality. IMPRESSION: No evidence of acute cardiopulmonary disease. Electronically Signed   By: Margaretha Sheffield M.D.   On: 01/17/2022 08:32   ? ?Procedures ?Procedures  ? ? ?Medications Ordered in ED ?Medications - No data to display ? ?ED Course/ Medical Decision Making/ A&P ?  ?                        ?Medical Decision Making ?Amount and/or Complexity of Data Reviewed ?Labs: ordered. ?Radiology: ordered. ? ? ? ?Tim Bradley is a 71 y.o. male with a past medical history significant for hypertension, hyperlipidemia, CKD, diabetes, previous DVT on Eliquis, CHF, seizures,  chronic spinal stenosis, and neurogenic bladder with chronic condom cath use who presents with confusion this morning and hypoxia with shortness of breath.  According to patient, he was seen by his PCP/cardiology team

## 2022-01-17 NOTE — ED Triage Notes (Signed)
Pt reports shob that started this AM, sister reports "abnormal behavior," but not clear on baseline. Pt was 89% on room air w/ems. Pt is 94% on room air upon admission  ?

## 2022-01-17 NOTE — ED Notes (Signed)
Pt maintaining O2 sats above 92% on room air.  ?

## 2022-01-22 ENCOUNTER — Other Ambulatory Visit: Payer: Self-pay

## 2022-01-22 ENCOUNTER — Encounter (HOSPITAL_COMMUNITY): Payer: Self-pay

## 2022-01-22 ENCOUNTER — Emergency Department (HOSPITAL_COMMUNITY)
Admission: EM | Admit: 2022-01-22 | Discharge: 2022-01-22 | Disposition: A | Discharge status: Home / Self Care | Payer: No Typology Code available for payment source | Attending: Emergency Medicine | Admitting: Emergency Medicine

## 2022-01-22 DIAGNOSIS — Z7901 Long term (current) use of anticoagulants: Secondary | ICD-10-CM | POA: Insufficient documentation

## 2022-01-22 DIAGNOSIS — F419 Anxiety disorder, unspecified: Secondary | ICD-10-CM | POA: Diagnosis not present

## 2022-01-22 DIAGNOSIS — Z79899 Other long term (current) drug therapy: Secondary | ICD-10-CM | POA: Diagnosis not present

## 2022-01-22 DIAGNOSIS — I10 Essential (primary) hypertension: Secondary | ICD-10-CM | POA: Insufficient documentation

## 2022-01-22 DIAGNOSIS — Z7984 Long term (current) use of oral hypoglycemic drugs: Secondary | ICD-10-CM | POA: Insufficient documentation

## 2022-01-22 DIAGNOSIS — R519 Headache, unspecified: Secondary | ICD-10-CM | POA: Insufficient documentation

## 2022-01-22 DIAGNOSIS — E119 Type 2 diabetes mellitus without complications: Secondary | ICD-10-CM | POA: Insufficient documentation

## 2022-01-22 NOTE — ED Notes (Signed)
Transport called and will be lengthy wait. Number 11 on list ?

## 2022-01-22 NOTE — ED Provider Notes (Signed)
?[facility] ?Provider Note ? ? ?CSN: [id number] ?Arrival date & time: 01/22/22  1552 ? ?  ? ?History ? ?Chief Complaint  ?Patient presents with  ? Headache  ?  Presents via EMS with c/o headache and high bp  ? ? ?Tim Bradley is a 71 y.o. male. ? ?HPI ?Patient presents for evaluation of headache.  Patient was comprehensively evaluated, 5 days ago in the ED for transient confusion.  Patient states that since arrival to the ED, his headache is resolved.  He denies shortness of breath, weakness, dizziness.  He states headache started today, bitemporal, while he was watching a movie.  He denies neck pain.  He has not followed up with his doctor at the [facility] since his prior ED visit 5 days ago.  He states that he wants some medicines for anxiety.  He is chronically debilitated, uses a wheelchair for ambulation due to old injuries to neck and back, which occurred "in the [place] War." ?  ? ?Home Medications ?Prior to Admission medications   ?Medication Sig Start Date End Date Taking? Authorizing Provider  ?apixaban (ELIQUIS) 5 MG TABS tablet Take 1 tablet (5 mg total) by mouth 2 (two) times daily. 01/10/22   Horton, Alvin Critchley, DO  ?ARTIFICIAL TEARS 0.2-0.2-1 % SOLN Place 1 drop into both eyes daily as needed for dry eyes. 12/05/21   [provider]  ?atorvastatin (LIPITOR) 80 MG tablet Take 1 tablet (80 mg total) by mouth daily. 01/10/22   Horton, Alvin Critchley, DO  ?carvedilol (COREG) 6.25 MG tablet Take 1 tablet (6.25 mg total) by mouth 2 (two) times daily with a meal. 01/10/22   Horton, Alvin Critchley, DO  ?divalproex (DEPAKOTE ER) 250 MG 24 hr tablet Take 2 tablets (500 mg total) by mouth daily. 01/10/22   Horton, Alvin Critchley, DO  ?divalproex (DEPAKOTE) 250 MG DR tablet Take 500 mg by mouth 2 (two) times daily.    [provider]  ?DULoxetine (CYMBALTA) 60 MG capsule Take 1 capsule (60 mg total) by mouth daily. 01/10/22   Horton, Alvin Critchley, DO  ?famotidine (PEPCID) 20 MG tablet Take  20 mg by mouth daily as needed for heartburn or indigestion.    [provider]  ?feeding supplement (ENSURE ENLIVE / ENSURE PLUS) LIQD Take 237 mLs by mouth 2 (two) times daily between meals. 12/24/21 01/23/22  Arrien, Jimmy Picket, MD  ?finasteride (PROSCAR) 5 MG tablet Take 5 mg by mouth daily.    [provider]  ?hydrALAZINE (APRESOLINE) 25 MG tablet Take 1 tablet (25 mg total) by mouth 3 (three) times daily. 12/24/21 01/23/22  Arrien, Jimmy Picket, MD  ?isosorbide dinitrate (ISORDIL) 20 MG tablet Take 1 tablet (20 mg total) by mouth 3 (three) times daily. 12/24/21 01/23/22  Arrien, Jimmy Picket, MD  ?levETIRAcetam (KEPPRA) 750 MG tablet Take 1 tablet (750 mg total) by mouth 2 (two) times daily. 01/10/22   Horton, Alvin Critchley, DO  ?tamsulosin (FLOMAX) 0.4 MG CAPS capsule Take 1 capsule (0.4 mg total) by mouth daily after supper. 01/27/20   Deno Etienne, DO  ?torsemide (DEMADEX) 100 MG tablet Take 0.5 tablets (50 mg total) by mouth daily. 12/24/21 01/23/22  Arrien, Jimmy Picket, MD  ?   ? ?Allergies    ?Trazodone and nefazodone   ? ?Review of Systems   ?Review of Systems ? ?Physical Exam ?Updated Vital Signs ?BP 138/82   Pulse (!) 109   Temp 98.4 ?F (36.9 ?C) (Oral)   Resp  20   Ht 6' 2.5" (1.892 m)   Wt 113.4 kg   SpO2 95%   BMI 31.67 kg/m?  ?Physical Exam ?Vitals and nursing note reviewed.  ?Constitutional:   ?   Appearance: He is well-developed. He is not ill-appearing.  ?HENT:  ?   Head: Normocephalic and atraumatic.  ?   Right Ear: External ear normal.  ?   Left Ear: External ear normal.  ?   Nose: No congestion or rhinorrhea.  ?   Mouth/Throat:  ?   Pharynx: No oropharyngeal exudate or posterior oropharyngeal erythema.  ?Eyes:  ?   Conjunctiva/sclera: Conjunctivae normal.  ?   Pupils: Pupils are equal, round, and reactive to light.  ?Neck:  ?   Trachea: Phonation normal.  ?Cardiovascular:  ?   Rate and Rhythm: Normal rate and regular rhythm.  ?   Heart sounds: Normal heart sounds.   ?Pulmonary:  ?   Effort: Pulmonary effort is normal. No respiratory distress.  ?   Breath sounds: Normal breath sounds. No stridor. No rhonchi.  ?Abdominal:  ?   General: There is no distension.  ?   Palpations: Abdomen is soft.  ?   Tenderness: There is no abdominal tenderness.  ?Musculoskeletal:     ?   General: No swelling or tenderness. Normal range of motion.  ?   Cervical back: Normal range of motion and neck supple.  ?Skin: ?   General: Skin is warm and dry.  ?Neurological:  ?   Mental Status: He is alert and oriented to person, place, and time.  ?   Cranial Nerves: No cranial nerve deficit.  ?   Sensory: No sensory deficit.  ?   Motor: No abnormal muscle tone.  ?   Coordination: Coordination normal.  ?Psychiatric:     ?   Mood and Affect: Mood normal.     ?   Behavior: Behavior normal.     ?   Thought Content: Thought content normal.     ?   Judgment: Judgment normal.  ? ? ?ED Results / Procedures / Treatments   ?Labs ?(all labs ordered are listed, but only abnormal results are displayed) ?Labs Reviewed - No data to display ? ?EKG ?None ? ?Radiology ?No results found. ? ?Procedures ?Procedures  ? ? ?Medications Ordered in ED ?Medications - No data to display ? ?ED Course/ Medical Decision Making/ A&P ?  ?                        ?Medical Decision Making ?Patient presenting for evaluation of headache which started while watching movie today just prior to arrival.  He was transferred by EMS.  No recent fever, chills, focal weakness or paresthesia.  Headache spontaneous resolved, after arrival to the emergency department. ? ?Amount and/or Complexity of Data Reviewed ?Independent Historian:  ?   Details: He is a cogent historian ? ?Risk ?OTC drugs. ?Decision regarding hospitalization. ?Risk Details: Patient presenting with transient headache.  He recently was in the ED for confusion.  Patient's headache resolved spontaneously.  He is clinically well.  No indication for ongoing CNS abnormality, or cardiovascular  compromise.  Headache is nonspecific but may be tension headache.  Consider cervical source from arthritis as well due to his age and prior disabilities including back and neck injuries.  He is neurovascularly intact.  No indication for further ED evaluation or hospitalization at this time.  Recommend symptomatic treatment with Tylenol.  Encouraged follow-up with  his PCP due to the recent multiple ED visits. ? ? ? ? ? ? ? ? ? ? ?Final Clinical Impression(s) / ED Diagnoses ?Final diagnoses:  ?Nonintractable headache, unspecified chronicity pattern, unspecified headache type  ? ? ?Rx / DC Orders ?ED Discharge Orders   ? ? None  ? ?  ? ? ?  ?Daleen Bo, MD ?01/22/22 1836 ? ?

## 2022-01-22 NOTE — ED Notes (Signed)
Dinner tray present when pt transported to unit ? ?

## 2022-01-22 NOTE — ED Triage Notes (Signed)
Presents via EMS with c/o headache and high bpPresents via EMS with c/o headache and high bp ?

## 2022-01-22 NOTE — Discharge Instructions (Signed)
There is no sign of serious problems associated with headache that you had.  The headache stopped on its own.  If it recurs try using Tylenol, 650 mg every 4 hours.  Sometimes headaches like this can be from the neck as a muscle spasm.  Consider using some heat on the neck if it is hurting.  Heating pad 3-4 times a day works well for this.  Return here if needed for problems.  Call your doctor to schedule a follow-up appointment to be seen about the period of confusion that you had last week, and the headache that you had today.  You can also talk to your doctor about getting some anxiety medicine if they think that that is indicated. ?

## 2022-01-24 ENCOUNTER — Emergency Department (HOSPITAL_COMMUNITY): Payer: No Typology Code available for payment source

## 2022-01-24 ENCOUNTER — Inpatient Hospital Stay (HOSPITAL_COMMUNITY)
Admission: EM | Admit: 2022-01-24 | Discharge: 2022-01-31 | DRG: 872 | Disposition: A | Discharge status: Skilled Nursing Facility | Payer: No Typology Code available for payment source | Attending: Family Medicine | Admitting: Family Medicine

## 2022-01-24 ENCOUNTER — Other Ambulatory Visit: Payer: Self-pay

## 2022-01-24 ENCOUNTER — Encounter (HOSPITAL_COMMUNITY): Payer: Self-pay

## 2022-01-24 DIAGNOSIS — R5381 Other malaise: Secondary | ICD-10-CM | POA: Diagnosis not present

## 2022-01-24 DIAGNOSIS — E1122 Type 2 diabetes mellitus with diabetic chronic kidney disease: Secondary | ICD-10-CM | POA: Diagnosis present

## 2022-01-24 DIAGNOSIS — I429 Cardiomyopathy, unspecified: Secondary | ICD-10-CM | POA: Diagnosis present

## 2022-01-24 DIAGNOSIS — I5022 Chronic systolic (congestive) heart failure: Secondary | ICD-10-CM | POA: Diagnosis present

## 2022-01-24 DIAGNOSIS — I825Y1 Chronic embolism and thrombosis of unspecified deep veins of right proximal lower extremity: Secondary | ICD-10-CM | POA: Diagnosis not present

## 2022-01-24 DIAGNOSIS — J449 Chronic obstructive pulmonary disease, unspecified: Secondary | ICD-10-CM | POA: Diagnosis present

## 2022-01-24 DIAGNOSIS — Z6832 Body mass index (BMI) 32.0-32.9, adult: Secondary | ICD-10-CM

## 2022-01-24 DIAGNOSIS — A419 Sepsis, unspecified organism: Secondary | ICD-10-CM | POA: Diagnosis not present

## 2022-01-24 DIAGNOSIS — N390 Urinary tract infection, site not specified: Secondary | ICD-10-CM | POA: Diagnosis not present

## 2022-01-24 DIAGNOSIS — R0600 Dyspnea, unspecified: Secondary | ICD-10-CM | POA: Diagnosis not present

## 2022-01-24 DIAGNOSIS — N400 Enlarged prostate without lower urinary tract symptoms: Secondary | ICD-10-CM | POA: Diagnosis present

## 2022-01-24 DIAGNOSIS — E785 Hyperlipidemia, unspecified: Secondary | ICD-10-CM | POA: Diagnosis present

## 2022-01-24 DIAGNOSIS — Z7401 Bed confinement status: Secondary | ICD-10-CM | POA: Diagnosis not present

## 2022-01-24 DIAGNOSIS — N184 Chronic kidney disease, stage 4 (severe): Secondary | ICD-10-CM | POA: Diagnosis present

## 2022-01-24 DIAGNOSIS — F418 Other specified anxiety disorders: Secondary | ICD-10-CM | POA: Diagnosis present

## 2022-01-24 DIAGNOSIS — I130 Hypertensive heart and chronic kidney disease with heart failure and stage 1 through stage 4 chronic kidney disease, or unspecified chronic kidney disease: Secondary | ICD-10-CM | POA: Diagnosis present

## 2022-01-24 DIAGNOSIS — I10 Essential (primary) hypertension: Secondary | ICD-10-CM | POA: Diagnosis present

## 2022-01-24 DIAGNOSIS — G40909 Epilepsy, unspecified, not intractable, without status epilepticus: Secondary | ICD-10-CM | POA: Diagnosis present

## 2022-01-24 DIAGNOSIS — B9620 Unspecified Escherichia coli [E. coli] as the cause of diseases classified elsewhere: Secondary | ICD-10-CM

## 2022-01-24 DIAGNOSIS — F419 Anxiety disorder, unspecified: Secondary | ICD-10-CM | POA: Diagnosis present

## 2022-01-24 DIAGNOSIS — I82409 Acute embolism and thrombosis of unspecified deep veins of unspecified lower extremity: Secondary | ICD-10-CM | POA: Diagnosis present

## 2022-01-24 DIAGNOSIS — A4151 Sepsis due to Escherichia coli [E. coli]: Principal | ICD-10-CM | POA: Diagnosis present

## 2022-01-24 DIAGNOSIS — Z66 Do not resuscitate: Secondary | ICD-10-CM | POA: Diagnosis present

## 2022-01-24 DIAGNOSIS — E1169 Type 2 diabetes mellitus with other specified complication: Secondary | ICD-10-CM | POA: Diagnosis present

## 2022-01-24 DIAGNOSIS — R7881 Bacteremia: Secondary | ICD-10-CM

## 2022-01-24 DIAGNOSIS — Z8249 Family history of ischemic heart disease and other diseases of the circulatory system: Secondary | ICD-10-CM

## 2022-01-24 DIAGNOSIS — Z20822 Contact with and (suspected) exposure to covid-19: Secondary | ICD-10-CM | POA: Diagnosis present

## 2022-01-24 DIAGNOSIS — E669 Obesity, unspecified: Secondary | ICD-10-CM | POA: Diagnosis present

## 2022-01-24 DIAGNOSIS — Z79899 Other long term (current) drug therapy: Secondary | ICD-10-CM | POA: Diagnosis not present

## 2022-01-24 DIAGNOSIS — Z7901 Long term (current) use of anticoagulants: Secondary | ICD-10-CM

## 2022-01-24 DIAGNOSIS — Z86718 Personal history of other venous thrombosis and embolism: Secondary | ICD-10-CM

## 2022-01-24 DIAGNOSIS — E871 Hypo-osmolality and hyponatremia: Secondary | ICD-10-CM | POA: Diagnosis present

## 2022-01-24 DIAGNOSIS — N3000 Acute cystitis without hematuria: Secondary | ICD-10-CM | POA: Diagnosis present

## 2022-01-24 DIAGNOSIS — D631 Anemia in chronic kidney disease: Secondary | ICD-10-CM | POA: Diagnosis present

## 2022-01-24 DIAGNOSIS — I428 Other cardiomyopathies: Secondary | ICD-10-CM | POA: Diagnosis not present

## 2022-01-24 DIAGNOSIS — D638 Anemia in other chronic diseases classified elsewhere: Secondary | ICD-10-CM | POA: Diagnosis present

## 2022-01-24 DIAGNOSIS — K5900 Constipation, unspecified: Secondary | ICD-10-CM | POA: Diagnosis present

## 2022-01-24 LAB — COMPREHENSIVE METABOLIC PANEL
ALT: 12 U/L (ref 0–44)
AST: 12 U/L — ABNORMAL LOW (ref 15–41)
Albumin: 3.4 g/dL — ABNORMAL LOW (ref 3.5–5.0)
Alkaline Phosphatase: 49 U/L (ref 38–126)
Anion gap: 9 (ref 5–15)
BUN: 78 mg/dL — ABNORMAL HIGH (ref 8–23)
CO2: 22 mmol/L (ref 22–32)
Calcium: 9.2 mg/dL (ref 8.9–10.3)
Chloride: 102 mmol/L (ref 98–111)
Creatinine, Ser: 3.01 mg/dL — ABNORMAL HIGH (ref 0.61–1.24)
GFR, Estimated: 21 mL/min — ABNORMAL LOW (ref >60)
Glucose, Bld: 151 mg/dL — ABNORMAL HIGH (ref 70–99)
Potassium: 4.1 mmol/L (ref 3.5–5.1)
Sodium: 133 mmol/L — ABNORMAL LOW (ref 135–145)
Total Bilirubin: 0.5 mg/dL (ref 0.3–1.2)
Total Protein: 7.3 g/dL (ref 6.5–8.1)

## 2022-01-24 LAB — RESP PANEL BY RT-PCR (FLU A&B, COVID) ARPGX2
Influenza A by PCR: NEGATIVE
Influenza B by PCR: NEGATIVE
SARS Coronavirus 2 by RT PCR: NEGATIVE

## 2022-01-24 LAB — CBC
HCT: 37.1 % — ABNORMAL LOW (ref 39.0–52.0)
Hemoglobin: 12 g/dL — ABNORMAL LOW (ref 13.0–17.0)
MCH: 29.6 pg (ref 26.0–34.0)
MCHC: 32.3 g/dL (ref 30.0–36.0)
MCV: 91.6 fL (ref 80.0–100.0)
Platelets: 182 10*3/uL (ref 150–400)
RBC: 4.05 MIL/uL — ABNORMAL LOW (ref 4.22–5.81)
RDW: 14 % (ref 11.5–15.5)
WBC: 9.3 10*3/uL (ref 4.0–10.5)
nRBC: 0 % (ref 0.0–0.2)

## 2022-01-24 LAB — URINALYSIS, ROUTINE W REFLEX MICROSCOPIC
Bilirubin Urine: NEGATIVE
Glucose, UA: NEGATIVE mg/dL
Ketones, ur: NEGATIVE mg/dL
Nitrite: NEGATIVE
Protein, ur: 100 mg/dL — AB
Specific Gravity, Urine: 1.011 (ref 1.005–1.030)
WBC, UA: >50 WBC/hpf — ABNORMAL HIGH (ref 0–5)
pH: 5 (ref 5.0–8.0)

## 2022-01-24 LAB — LACTIC ACID, PLASMA: Lactic Acid, Venous: 1.6 mmol/L (ref 0.5–1.9)

## 2022-01-24 LAB — APTT: aPTT: 33 seconds (ref 24–36)

## 2022-01-24 LAB — PROTIME-INR
INR: 1.4 — ABNORMAL HIGH (ref 0.8–1.2)
Prothrombin Time: 17 seconds — ABNORMAL HIGH (ref 11.4–15.2)

## 2022-01-24 LAB — GLUCOSE, CAPILLARY: Glucose-Capillary: 269 mg/dL — ABNORMAL HIGH (ref 70–99)

## 2022-01-24 MED ORDER — HYDRALAZINE HCL 25 MG PO TABS
25.0000 mg | ORAL_TABLET | Freq: Three times a day (TID) | ORAL | Status: DC
  Administered 2022-01-25 – 2022-01-30 (×15): 25 mg via ORAL
  Filled 2022-01-24 (×16): qty 1

## 2022-01-24 MED ORDER — CARVEDILOL 6.25 MG PO TABS
6.2500 mg | ORAL_TABLET | Freq: Two times a day (BID) | ORAL | Status: DC
  Administered 2022-01-25 – 2022-01-31 (×14): 6.25 mg via ORAL
  Filled 2022-01-24 (×14): qty 1

## 2022-01-24 MED ORDER — INSULIN ASPART 100 UNIT/ML IJ SOLN
0.0000 [IU] | Freq: Three times a day (TID) | INTRAMUSCULAR | Status: DC
  Administered 2022-01-25 (×2): 4 [IU] via SUBCUTANEOUS
  Administered 2022-01-26 – 2022-01-27 (×2): 3 [IU] via SUBCUTANEOUS
  Administered 2022-01-27: 4 [IU] via SUBCUTANEOUS
  Administered 2022-01-27 – 2022-01-28 (×2): 3 [IU] via SUBCUTANEOUS
  Administered 2022-01-28 – 2022-01-29 (×3): 4 [IU] via SUBCUTANEOUS
  Administered 2022-01-29 – 2022-01-31 (×5): 3 [IU] via SUBCUTANEOUS

## 2022-01-24 MED ORDER — ACETAMINOPHEN 500 MG PO TABS
1000.0000 mg | ORAL_TABLET | Freq: Once | ORAL | Status: AC
Start: 2022-01-24 — End: 2022-01-24
  Administered 2022-01-24: 1000 mg via ORAL
  Filled 2022-01-24: qty 2

## 2022-01-24 MED ORDER — ACETAMINOPHEN 500 MG PO TABS: 1000.0000 mg | ORAL_TABLET | Freq: Four times a day (QID) | ORAL | Status: DC | PRN

## 2022-01-24 MED ORDER — ISOSORBIDE DINITRATE 20 MG PO TABS
20.0000 mg | ORAL_TABLET | Freq: Three times a day (TID) | ORAL | Status: DC
  Administered 2022-01-25 – 2022-01-30 (×15): 20 mg via ORAL
  Filled 2022-01-24 (×17): qty 1

## 2022-01-24 MED ORDER — SODIUM CHLORIDE 0.9 % IV SOLN
2.0000 g | Freq: Once | INTRAVENOUS | Status: AC
  Administered 2022-01-24: 2 g via INTRAVENOUS
  Filled 2022-01-24: qty 20

## 2022-01-24 MED ORDER — HEPARIN SODIUM (PORCINE) 5000 UNIT/ML IJ SOLN
5000.0000 [IU] | Freq: Two times a day (BID) | INTRAMUSCULAR | Status: DC
  Administered 2022-01-24 – 2022-01-25 (×2): 5000 [IU] via SUBCUTANEOUS
  Filled 2022-01-24 (×2): qty 1

## 2022-01-24 MED ORDER — LEVETIRACETAM 500 MG PO TABS
750.0000 mg | ORAL_TABLET | Freq: Two times a day (BID) | ORAL | Status: DC
  Administered 2022-01-24 – 2022-01-27 (×6): 750 mg via ORAL
  Filled 2022-01-24 (×6): qty 1

## 2022-01-24 MED ORDER — SODIUM CHLORIDE 0.9 % IV SOLN: Freq: Once | INTRAVENOUS | Status: AC

## 2022-01-24 MED ORDER — SODIUM CHLORIDE 0.9 % IV SOLN: 1.0000 g | INTRAVENOUS | Status: DC

## 2022-01-24 MED ORDER — SENNA 8.6 MG PO TABS
1.0000 | ORAL_TABLET | Freq: Two times a day (BID) | ORAL | Status: DC
  Administered 2022-01-24 – 2022-01-25 (×3): 8.6 mg via ORAL
  Filled 2022-01-24 (×3): qty 1

## 2022-01-24 MED ORDER — ATORVASTATIN CALCIUM 80 MG PO TABS
80.0000 mg | ORAL_TABLET | Freq: Every day | ORAL | Status: DC
  Administered 2022-01-24 – 2022-01-31 (×8): 80 mg via ORAL
  Filled 2022-01-24 (×6): qty 1
  Filled 2022-01-24: qty 2
  Filled 2022-01-24: qty 1

## 2022-01-24 MED ORDER — DIVALPROEX SODIUM ER 500 MG PO TB24
500.0000 mg | ORAL_TABLET | Freq: Every day | ORAL | Status: DC
  Administered 2022-01-24 – 2022-01-25 (×2): 500 mg via ORAL
  Filled 2022-01-24 (×2): qty 1

## 2022-01-24 MED ORDER — ACETAMINOPHEN 325 MG PO TABS
650.0000 mg | ORAL_TABLET | Freq: Four times a day (QID) | ORAL | Status: DC | PRN
  Administered 2022-01-25 – 2022-01-31 (×9): 650 mg via ORAL
  Filled 2022-01-24 (×8): qty 2

## 2022-01-24 MED ORDER — SODIUM CHLORIDE 0.9 % IV BOLUS (SEPSIS)
500.0000 mL | Freq: Once | INTRAVENOUS | Status: AC
  Administered 2022-01-24: 500 mL via INTRAVENOUS

## 2022-01-24 MED ORDER — TAMSULOSIN HCL 0.4 MG PO CAPS
0.4000 mg | ORAL_CAPSULE | Freq: Every day | ORAL | Status: DC
  Administered 2022-01-25 – 2022-01-31 (×7): 0.4 mg via ORAL
  Filled 2022-01-24 (×7): qty 1

## 2022-01-24 MED ORDER — TORSEMIDE 20 MG PO TABS
50.0000 mg | ORAL_TABLET | Freq: Every day | ORAL | Status: DC
  Administered 2022-01-25: 50 mg via ORAL
  Filled 2022-01-24 (×2): qty 3

## 2022-01-24 MED ORDER — KETOROLAC TROMETHAMINE 15 MG/ML IJ SOLN
15.0000 mg | Freq: Four times a day (QID) | INTRAMUSCULAR | Status: AC | PRN
  Administered 2022-01-25 – 2022-01-29 (×8): 15 mg via INTRAVENOUS
  Filled 2022-01-24 (×8): qty 1

## 2022-01-24 MED ORDER — ACETAMINOPHEN 650 MG RE SUPP: 650.0000 mg | Freq: Four times a day (QID) | RECTAL | Status: DC | PRN

## 2022-01-24 MED ORDER — SODIUM CHLORIDE 0.9 % IV BOLUS
1000.0000 mL | Freq: Once | INTRAVENOUS | Status: AC
  Administered 2022-01-24: 1000 mL via INTRAVENOUS

## 2022-01-24 MED ORDER — NAPHAZOLINE-PHENIRAMINE 0.025-0.3 % OP SOLN
1.0000 [drp] | Freq: Four times a day (QID) | OPHTHALMIC | Status: DC | PRN
  Filled 2022-01-24: qty 15

## 2022-01-24 NOTE — Assessment & Plan Note (Addendum)
There was some confusion regarding whether he is taking Eliquis or not.  Patient mentions that he is indeed taking it.  Eliquis was resumed. ?

## 2022-01-24 NOTE — Assessment & Plan Note (Addendum)
May continue with tamsulosin. ?

## 2022-01-24 NOTE — Assessment & Plan Note (Addendum)
Patient was abnormal UA.  He was febrile.  Also had tachycardia.  Patient was admitted and started on ceftriaxone.   ?Blood culture positive for E. coli in 2 out of 4 bottles with concern for ESBL.   ?Patient changed over to meropenem on 4/29.  WBC is normal.  Urine culture growing E. coli and Morganella.  Waiting on final identification and sensitivities. ?

## 2022-01-24 NOTE — Assessment & Plan Note (Addendum)
E. coli bacteremia, ESBL ? ?Patient had evidence for sepsis with fever, tachycardia.  WBC was noted to be normal.  Initially started on ceftriaxone.  Due to concern for ESBL E. coli he was changed over to meropenem.  Blood culture does show ESBL E. coli.  We will consult ID to assist with antibiotic management. ?

## 2022-01-24 NOTE — ED Notes (Signed)
While getting patient triaged, his POA arrived and said that she did not want anything further done to the patient. She states she called EMS for assistance getting him in her car so she could take him to the [facility]. She states EMS told her he was too sick, put an IV in him and bought him here. She states her son is the way to help he get him to the [facility] and they are leaving as soon as he gets here. Dr. Maryan Rued made aware.  ?

## 2022-01-24 NOTE — ED Notes (Signed)
Patient returns from CT scan.

## 2022-01-24 NOTE — ED Notes (Signed)
Patient to CT scan

## 2022-01-24 NOTE — Assessment & Plan Note (Addendum)
Apparently on Keppra and Depakote prior to admission but he may not have been taking these medications as per H&P.   ?Some interaction between meropenem and Depakote noted which may decrease the efficacy of Depakote.   ?Depakote was stopped.  Keppra was continued.   ?

## 2022-01-24 NOTE — ED Provider Notes (Signed)
?[facility] ?Provider Note ? ? ?CSN: [id number] ?Arrival date & time: 01/24/22  1538 ? ?  ? ?History ? ?No chief complaint on file. ? ? ?Tim Bradley is a 71 y.o. male with past medical history significant for diabetes, seizures, history of hypertension, cocaine abuse, CHF, CKD who presents with concern for severe sudden onset headache, tachypnea. Patient endorses some fever, chills.  Sister reports that he has been having seizures, occasional medication noncompliance, transient confusion, headache just 2 days ago, has not been "properly evaluated".  She reports that he has had difficulty with ambulation for around a year.  She reports that he had several, frequent UTIs when [place] was [place] [facility] [place].  Patient denies any chest pain at this time. ? ?HPI ? ?  ? ?Home Medications ?Prior to Admission medications   ?Medication Sig Start Date End Date Taking? Authorizing Provider  ?apixaban (ELIQUIS) 5 MG TABS tablet Take 1 tablet (5 mg total) by mouth 2 (two) times daily. 01/10/22   Horton, Alvin Critchley, DO  ?ARTIFICIAL TEARS 0.2-0.2-1 % SOLN Place 1 drop into both eyes daily as needed for dry eyes. 12/05/21   [provider]  ?atorvastatin (LIPITOR) 80 MG tablet Take 1 tablet (80 mg total) by mouth daily. 01/10/22   Horton, Alvin Critchley, DO  ?carvedilol (COREG) 6.25 MG tablet Take 1 tablet (6.25 mg total) by mouth 2 (two) times daily with a meal. 01/10/22   Horton, Alvin Critchley, DO  ?divalproex (DEPAKOTE ER) 250 MG 24 hr tablet Take 2 tablets (500 mg total) by mouth daily. 01/10/22   Horton, Alvin Critchley, DO  ?divalproex (DEPAKOTE) 250 MG DR tablet Take 500 mg by mouth 2 (two) times daily.    [provider]  ?DULoxetine (CYMBALTA) 60 MG capsule Take 1 capsule (60 mg total) by mouth daily. 01/10/22   Horton, Alvin Critchley, DO  ?famotidine (PEPCID) 20 MG tablet Take 20 mg by mouth daily as needed for heartburn or indigestion.    [provider]   ?finasteride (PROSCAR) 5 MG tablet Take 5 mg by mouth daily.    [provider]  ?hydrALAZINE (APRESOLINE) 25 MG tablet Take 1 tablet (25 mg total) by mouth 3 (three) times daily. 12/24/21 01/23/22  Arrien, Jimmy Picket, MD  ?isosorbide dinitrate (ISORDIL) 20 MG tablet Take 1 tablet (20 mg total) by mouth 3 (three) times daily. 12/24/21 01/23/22  Arrien, Jimmy Picket, MD  ?levETIRAcetam (KEPPRA) 750 MG tablet Take 1 tablet (750 mg total) by mouth 2 (two) times daily. 01/10/22   Horton, Alvin Critchley, DO  ?tamsulosin (FLOMAX) 0.4 MG CAPS capsule Take 1 capsule (0.4 mg total) by mouth daily after supper. 01/27/20   Deno Etienne, DO  ?torsemide (DEMADEX) 100 MG tablet Take 0.5 tablets (50 mg total) by mouth daily. 12/24/21 01/23/22  Arrien, Jimmy Picket, MD  ?   ? ?Allergies    ?Trazodone and nefazodone   ? ?Review of Systems   ?Review of Systems  ?Constitutional:  Positive for fever.  ?Respiratory:  Positive for shortness of breath.   ?Neurological:  Positive for weakness and headaches.  ?All other systems reviewed and are negative. ? ?Physical Exam ?Updated Vital Signs ?BP (!) 161/89   Pulse (!) 118   Temp (!) 103.1 ?F (39.5 ?C) (Oral)   Resp (!) 31   SpO2 95%  ?Physical Exam ?Vitals and nursing note reviewed.  ?Constitutional:   ?   General: He is not in acute  distress. ?   Appearance: Normal appearance. He is ill-appearing.  ?HENT:  ?   Head: Normocephalic and atraumatic.  ?Eyes:  ?   General:     ?   Right eye: No discharge.     ?   Left eye: No discharge.  ?Cardiovascular:  ?   Rate and Rhythm: Regular rhythm. Tachycardia present.  ?   Heart sounds: No murmur heard. ?  No friction rub. No gallop.  ?   Comments: Patient with weak 1+ DP, PT pulses, both of them confirmed on Doppler. ?Pulmonary:  ?   Effort: Pulmonary effort is normal.  ?   Breath sounds: Normal breath sounds.  ?Abdominal:  ?   General: Bowel sounds are normal.  ?   Palpations: Abdomen is soft.  ?Genitourinary: ?   Comments: Condom cap  foley in place, cloudy appearance of urine in bag. No significant pressure ulcers noted on back or buttocks ?Skin: ?   General: Skin is warm and dry.  ?   Capillary Refill: Capillary refill takes 2 to 3 seconds.  ?   Comments: Some scattered small 76mm ulcers of likely PAD on bilateral feet  ?Neurological:  ?   Mental Status: He is alert and oriented to person, place, and time.  ?   Comments: Cranial nerves II through XII grossly intact.  Intact finger-nose, unable to perform heel-to-shin secondary to leg weakness.  Romberg,  gait deferred secondary to baseline leg weakness.  Alert and oriented x3.  Moves all 4 limbs spontaneously, normal coordination, decreased strength of lower extremities.  No pronator drift.  Intact strength 5 out of 5 bilateral upper extremities, 3/5 bilateral lower extremities. ? ?  ?Psychiatric:     ?   Mood and Affect: Mood normal.     ?   Behavior: Behavior normal.  ? ? ?ED Results / Procedures / Treatments   ?Labs ?(all labs ordered are listed, but only abnormal results are displayed) ?Labs Reviewed  ?CBC - Abnormal; Notable for the following components:  ?    Result Value  ? RBC 4.05 (*)   ? Hemoglobin 12.0 (*)   ? HCT 37.1 (*)   ? All other components within normal limits  ?PROTIME-INR - Abnormal; Notable for the following components:  ? Prothrombin Time 17.0 (*)   ? INR 1.4 (*)   ? All other components within normal limits  ?URINALYSIS, ROUTINE W REFLEX MICROSCOPIC - Abnormal; Notable for the following components:  ? APPearance TURBID (*)   ? Hgb urine dipstick MODERATE (*)   ? Protein, ur 100 (*)   ? Leukocytes,Ua LARGE (*)   ? WBC, UA >50 (*)   ? Bacteria, UA MANY (*)   ? All other components within normal limits  ?COMPREHENSIVE METABOLIC PANEL - Abnormal; Notable for the following components:  ? Sodium 133 (*)   ? Glucose, Bld 151 (*)   ? BUN 78 (*)   ? Creatinine, Ser 3.01 (*)   ? Albumin 3.4 (*)   ? AST 12 (*)   ? GFR, Estimated 21 (*)   ? All other components within normal limits   ?RESP PANEL BY RT-PCR (FLU A&B, COVID) [id number]  ?CULTURE, BLOOD (ROUTINE X 2)  ?CULTURE, BLOOD (ROUTINE X 2)  ?URINE CULTURE  ?APTT  ?LACTIC ACID, PLASMA  ?LACTIC ACID, PLASMA  ? ? ?EKG ?EKG Interpretation ? ?Date/Time:  Friday January 24 2022 17:37:43 EDT ?Ventricular Rate:  115 ?PR Interval:  130 ?QRS Duration: 163 ?QT Interval:  361 ?QTC Calculation: 500 ?R Axis:   -72 ?Text Interpretation: Sinus tachycardia Probable left atrial enlargement Left bundle branch block Since last tracing rate faster Confirmed by Dorie Rank [phone]) on 01/24/2022 5:45:59 PM ? ?Radiology ?DG Chest Port 1 View ? ?Result Date: 01/24/2022 ?CLINICAL DATA:  Shortness of breath with questionable sepsis EXAM: PORTABLE CHEST 1 VIEW COMPARISON:  January 17, 2022 FINDINGS: Heart is borderline. Mediastinal contours are within normal limits. Both lungs are clear without focal consolidation, visible pleural effusion or pneumothorax. Moderate hypertrophic changes of the right AC joint. IMPRESSION: No active disease. Electronically Signed   By: Frazier Richards M.D.   On: 01/24/2022 17:16   ? ?Procedures ?Marland KitchenCritical Care ?Performed by: Anselmo Pickler, PA-C ?Authorized by: Anselmo Pickler, PA-C  ? ?Critical care provider statement:  ?  Critical care time (minutes):  38 ?  Critical care was necessary to treat or prevent imminent or life-threatening deterioration of the following conditions:  Sepsis ?  Critical care was time spent personally by me on the following activities:  Development of treatment plan with patient or surrogate, discussions with consultants, evaluation of patient's response to treatment, examination of patient, ordering and review of laboratory studies, ordering and review of radiographic studies, ordering and performing treatments and interventions, pulse oximetry, re-evaluation of patient's condition and review of old charts  ? ? ?Medications Ordered in ED ?Medications  ?sodium chloride 0.9 % bolus 1,000 mL (1,000 mLs  Intravenous New Bag/Given 01/24/22 1710)  ?cefTRIAXone (ROCEPHIN) 2 g in sodium chloride 0.9 % 100 mL IVPB (0 g Intravenous Stopped 01/24/22 1748)  ?0.9 %  sodium chloride infusion ( Intravenous New Bag/Given 01/24/22 1750)  ?sod

## 2022-01-24 NOTE — Progress Notes (Signed)
Elink is following code sepsis 

## 2022-01-24 NOTE — Assessment & Plan Note (Addendum)
Last A1C at [facility] 12/27/21 7.1 %.  Currently on SSI.  Home medication list reviewed.  Not noted to be on any glucose lowering agents prior to admission.  CBGs are reasonably well controlled. ?

## 2022-01-24 NOTE — Assessment & Plan Note (Addendum)
Last ECHO availalb from 04/11/20 with EF < 20%.  ?Noted to be on torsemide prior to admission which is currently being continued.  Beta-blocker being continued.  No ACE inhibitor or ARB due to renal dysfunction. ?Echocardiogram was done during this admission which showed EF to be 35 to 40%.  Global hypokinesis was noted.  Moderate LVH was noted. ? ?

## 2022-01-24 NOTE — H&P (Signed)
?History and Physical  ? ? ?Tim Bradley BDZ:[id number] DOB: Jul 10, 1951 DOA: 01/24/2022 ? ?DOS: the patient was seen and examined on 01/24/2022 ? ?PCP: [facility]  ? ?Patient coming from: Home ? ?I have personally briefly reviewed patient's old medical records in [place] ? ?No new subjective & objective note has been filed under this hospital service since the last note was generated. ? Mr. Cisek a 71 y/o with h/o cardiomyopathy, DM, HTN, BPH, CKD, seizure disorder was noted to have a high fever at home, along with HA, chills. He presents to [facility] for evaluation ? ?ED Course: T 100.3 105/71  HR 96  RR 15. Patient denied chest pain, rrespiratory distress. He had a strongly positive U/A with > 50 WBC/hpf, many bacteria. With fever, tachycardia, and multiple co-morbidities he appeared to have early sepsis. Resuscitation was initiated with 1.5 L blus of NS and he was given 2g Rocephin. TR[facility] called to admit for continued management.  ? ?Review of Systems:  ?Review of Systems  ?Constitutional:  Positive for chills and fever. Negative for malaise/fatigue and weight loss.  ?HENT: Negative.    ?Eyes: Negative.   ?Respiratory: Negative.    ?Cardiovascular:  Negative for chest pain, palpitations, leg swelling and PND.  ?Gastrointestinal: Negative.   ?Genitourinary:  Positive for frequency. Negative for dysuria.  ?Musculoskeletal: Negative.   ?Skin: Negative.   ?Neurological:  Positive for tingling, sensory change and weakness.  ?Psychiatric/Behavioral: Negative.    ? ?Past Medical History:  ?Diagnosis Date  ? Abscess 02/2017  ? LEFT GLUTEAL   ? Acid reflux   ? Alcohol abuse   ? Anemia   ? CHF (congestive heart failure) ([place])   ? CKD (chronic kidney disease) stage 4, GFR 15-29 ml/min (HCC) 04/11/2020  ? Cocaine abuse ([place])   ? DDD (degenerative disc disease), lumbar   ? Diabetes mellitus without complication ([place])   ? Dyspnea   ? Enlarged prostate   ? Hypertension   ? Seizures ([place])   ? Spinal stenosis, lumbar    ? ? ?Past Surgical History:  ?Procedure Laterality Date  ? BACK SURGERY    ? cervical x2  ? BIOPSY  08/21/2018  ? Procedure: BIOPSY;  Surgeon: Ronald Lobo, MD;  Location: [facility];  Service: Endoscopy;;  ? ESOPHAGOGASTRODUODENOSCOPY (EGD) WITH PROPOFOL N/A 08/21/2018  ? Procedure: ESOPHAGOGASTRODUODENOSCOPY (EGD) WITH PROPOFOL;  Surgeon: Ronald Lobo, MD;  Location: [facility];  Service: Endoscopy;  Laterality: N/A;  ? INCISION AND DRAINAGE PERIRECTAL ABSCESS N/A 03/26/2017  ? Procedure: IRRIGATION AND DEBRIDEMENT PERIRECTAL ABSCESS;  Surgeon: Rolm Bookbinder, MD;  Location: [facility];  Service: General;  Laterality: N/A;  ? ? ?Soc Hx - widowed after 84 years of marriage. Served in [employer] for 3 years. 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 He reports he is bedbound after neck surgeries and damaged knees. He has his own apartment He reports his sister assists with his care and other care givers are there regularly to help with ADLs. ? ? reports that he has been smoking cigarettes. He has been smoking an average of .25 packs per day. He has never used smokeless tobacco. He reports current alcohol use of about 12.0 standard drinks per week. He reports current drug use. Drugs: Cocaine and Marijuana. ? ?Allergies  ?Allergen Reactions  ? Trazodone And Nefazodone   ?  Family reports seizures from this  medication  ? ? ?Family History  ?Problem Relation Age of Onset  ? Hypertension Mother   ? Cancer - Lung Father   ? ? ?Prior to Admission medications   ?Medication Sig Start Date End Date Taking? Authorizing Provider  ?acetaminophen (TYLENOL) 500 MG tablet Take 1,000 mg by mouth every 6 (six) hours as needed for moderate pain or headache.   Yes [provider]  ?carvedilol (COREG) 6.25 MG tablet Take 1 tablet (6.25 mg total) by mouth 2 (two) times daily with a meal. 01/10/22  Yes Horton, Alvin Critchley, DO   ?Cholecalciferol (QC VITAMIN D3) 50 MCG (2000 UT) TABS Take 2,000 Units by mouth daily.   Yes [provider]  ?Naphazoline-Pheniramine (VISINE-A OP) Place 1 drop into both eyes daily as needed (dry/itchy eyes).   Yes [provider]  ?torsemide (DEMADEX) 100 MG tablet Take 0.5 tablets (50 mg total) by mouth daily. ?Patient taking differently: Take 50 mg by mouth at bedtime. 12/24/21 01/24/22 Yes Arrien, Jimmy Picket, MD  ?apixaban (ELIQUIS) 5 MG TABS tablet Take 1 tablet (5 mg total) by mouth 2 (two) times daily. ?Patient not taking: Reported on 01/24/2022 01/10/22   Horton, Alvin Critchley, DO  ?atorvastatin (LIPITOR) 80 MG tablet Take 1 tablet (80 mg total) by mouth daily. ?Patient not taking: Reported on 01/24/2022 01/10/22   Horton, Alvin Critchley, DO  ?divalproex (DEPAKOTE ER) 250 MG 24 hr tablet Take 2 tablets (500 mg total) by mouth daily. ?Patient not taking: Reported on 01/24/2022 01/10/22   Horton, Alvin Critchley, DO  ?DULoxetine (CYMBALTA) 60 MG capsule Take 1 capsule (60 mg total) by mouth daily. ?Patient not taking: Reported on 01/24/2022 01/10/22   Horton, Alvin Critchley, DO  ?hydrALAZINE (APRESOLINE) 25 MG tablet Take 1 tablet (25 mg total) by mouth 3 (three) times daily. ?Patient not taking: Reported on 01/24/2022 12/24/21 01/23/22  Arrien, Jimmy Picket, MD  ?isosorbide dinitrate (ISORDIL) 20 MG tablet Take 1 tablet (20 mg total) by mouth 3 (three) times daily. ?Patient not taking: Reported on 01/24/2022 12/24/21 01/23/22  Arrien, Jimmy Picket, MD  ?levETIRAcetam (KEPPRA) 750 MG tablet Take 1 tablet (750 mg total) by mouth 2 (two) times daily. ?Patient not taking: Reported on 01/24/2022 01/10/22   Horton, Alvin Critchley, DO  ?tamsulosin (FLOMAX) 0.4 MG CAPS capsule Take 1 capsule (0.4 mg total) by mouth daily after supper. ?Patient not taking: Reported on 01/24/2022 01/27/20   Deno Etienne, DO  ? ? ?Physical Exam: ?Vitals:  ? 01/24/22 2030 01/24/22 2045 01/24/22 2115 01/24/22 2200  ?BP: 103/66 (!) 91/55 105/71 113/75   ?Pulse: 100 99 96 93  ?Resp: (!) 21 (!) 23 15 15   ?Temp:      ?TempSrc:      ?SpO2: 96% 94% 94% 95%  ? ? ?Physical Exam ?Vitals and nursing note reviewed.  ?Constitutional:   ?   Appearance: He is obese.  ?HENT:  ?   Head: Normocephalic and atraumatic.  ?   Mouth/Throat:  ?   Mouth: Mucous membranes are moist.  ?   Comments: Edentulous, no oral lesions ?Eyes:  ?   Conjunctiva/sclera: Conjunctivae normal.  ?   Pupils: Pupils are equal, round, and reactive to light.  ?Neck:  ?   Comments: Full ROM not tested ?Cardiovascular:  ?   Rate and Rhythm: Regular rhythm. Tachycardia present.  ?   Pulses: Normal pulses.  ?   Heart sounds: Normal heart sounds.  ?Pulmonary:  ?   Effort: Pulmonary effort is normal. No respiratory  distress.  ?   Breath sounds: Normal breath sounds. No wheezing or rales.  ?Abdominal:  ?   General: Bowel sounds are normal.  ?   Comments: Obese with great girth hindering exam. No suprapubic tenderness  ?Musculoskeletal:     ?   General: No swelling, deformity or signs of injury.  ?   Cervical back: Neck supple.  ?Skin: ?   General: Skin is warm and dry.  ?   Comments: Back and sacrum not examined 2/2 difficulty moving patient on ER stretcher due to girth. Will instruct nursing to inspect areas. ? ?Onychiomycosis great toe-nails. Tips of both great toes with pigment changes w/o open sores.   ?Neurological:  ?   General: No focal deficit present.  ?   Mental Status: He is alert and oriented to person, place, and time.  ?   Comments: Decreased sensation to light touch distal LE  ?Psychiatric:     ?   Mood and Affect: Mood normal.     ?   Behavior: Behavior normal.  ?  ? ?Labs on Admission: I have personally reviewed following labs and imaging studies ? ?CBC: ?Recent Labs  ?Lab 01/24/22 ?1540  ?WBC 9.3  ?HGB 12.0*  ?HCT 37.1*  ?MCV 91.6  ?PLT 182  ? ?Basic Metabolic Panel: ?Recent Labs  ?Lab 01/24/22 ?1621  ?NA 133*  ?K 4.1  ?CL 102  ?CO2 22  ?GLUCOSE 151*  ?BUN 78*  ?CREATININE 3.01*  ?CALCIUM 9.2   ? ?GFR: ?Estimated Creatinine Clearance: 29.5 mL/min (A) (by C-G formula based on SCr of 3.01 mg/dL (H)). ?Liver Function Tests: ?Recent Labs  ?Lab 01/24/22 ?1621  ?AST 12*  ?ALT 12  ?ALKPHOS 49  ?BILITOT 0.5  ?PR

## 2022-01-24 NOTE — Assessment & Plan Note (Addendum)
Blood pressure reasonably well controlled. ?

## 2022-01-25 DIAGNOSIS — N184 Chronic kidney disease, stage 4 (severe): Secondary | ICD-10-CM | POA: Diagnosis not present

## 2022-01-25 DIAGNOSIS — R7881 Bacteremia: Secondary | ICD-10-CM

## 2022-01-25 DIAGNOSIS — I5022 Chronic systolic (congestive) heart failure: Secondary | ICD-10-CM | POA: Diagnosis not present

## 2022-01-25 DIAGNOSIS — D638 Anemia in other chronic diseases classified elsewhere: Secondary | ICD-10-CM | POA: Diagnosis present

## 2022-01-25 DIAGNOSIS — R0600 Dyspnea, unspecified: Secondary | ICD-10-CM | POA: Diagnosis not present

## 2022-01-25 DIAGNOSIS — N3000 Acute cystitis without hematuria: Secondary | ICD-10-CM | POA: Diagnosis not present

## 2022-01-25 DIAGNOSIS — B9620 Unspecified Escherichia coli [E. coli] as the cause of diseases classified elsewhere: Secondary | ICD-10-CM

## 2022-01-25 LAB — GLUCOSE, CAPILLARY
Glucose-Capillary: 182 mg/dL — ABNORMAL HIGH (ref 70–99)
Glucose-Capillary: 157 mg/dL — ABNORMAL HIGH (ref 70–99)
Glucose-Capillary: 102 mg/dL — ABNORMAL HIGH (ref 70–99)
Glucose-Capillary: 120 mg/dL — ABNORMAL HIGH (ref 70–99)

## 2022-01-25 LAB — BLOOD CULTURE ID PANEL (REFLEXED) - BCID2

## 2022-01-25 LAB — LACTIC ACID, PLASMA
Lactic Acid, Venous: 2.5 mmol/L (ref 0.5–1.9)
Lactic Acid, Venous: 1.3 mmol/L (ref 0.5–1.9)

## 2022-01-25 LAB — BASIC METABOLIC PANEL
Anion gap: 11 (ref 5–15)
BUN: 77 mg/dL — ABNORMAL HIGH (ref 8–23)
CO2: 20 mmol/L — ABNORMAL LOW (ref 22–32)
Calcium: 8.7 mg/dL — ABNORMAL LOW (ref 8.9–10.3)
Chloride: 103 mmol/L (ref 98–111)
Creatinine, Ser: 3.14 mg/dL — ABNORMAL HIGH (ref 0.61–1.24)
GFR, Estimated: 20 mL/min — ABNORMAL LOW (ref >60)
Glucose, Bld: 201 mg/dL — ABNORMAL HIGH (ref 70–99)
Potassium: 3.5 mmol/L (ref 3.5–5.1)
Sodium: 134 mmol/L — ABNORMAL LOW (ref 135–145)

## 2022-01-25 LAB — BRAIN NATRIURETIC PEPTIDE: B Natriuretic Peptide: 484 pg/mL — ABNORMAL HIGH (ref 0.0–100.0)

## 2022-01-25 MED ORDER — APIXABAN 5 MG PO TABS
5.0000 mg | ORAL_TABLET | Freq: Two times a day (BID) | ORAL | Status: DC
  Administered 2022-01-25 – 2022-01-31 (×14): 5 mg via ORAL
  Filled 2022-01-25 (×14): qty 1

## 2022-01-25 MED ORDER — IPRATROPIUM-ALBUTEROL 0.5-2.5 (3) MG/3ML IN SOLN
3.0000 mL | Freq: Three times a day (TID) | RESPIRATORY_TRACT | Status: DC
  Administered 2022-01-25 – 2022-01-26 (×2): 3 mL via RESPIRATORY_TRACT
  Filled 2022-01-25 (×3): qty 3

## 2022-01-25 MED ORDER — SODIUM CHLORIDE 0.9 % IV SOLN
1.0000 g | Freq: Two times a day (BID) | INTRAVENOUS | Status: DC
  Administered 2022-01-25 – 2022-01-28 (×8): 1 g via INTRAVENOUS
  Filled 2022-01-25 (×9): qty 20

## 2022-01-25 MED ORDER — GUAIFENESIN-DM 100-10 MG/5ML PO SYRP
5.0000 mL | ORAL_SOLUTION | ORAL | Status: DC | PRN
  Administered 2022-01-25 – 2022-01-26 (×2): 5 mL via ORAL
  Filled 2022-01-25 (×3): qty 5

## 2022-01-25 NOTE — Progress Notes (Signed)
ANTICOAGULATION CONSULT NOTE - Initial Consult ? ?Pharmacy Consult for Apixaban ?Indication: DVT ? ?Allergies  ?Allergen Reactions  ? Trazodone And Nefazodone   ?  Family reports seizures from this medication  ? ? ?Patient Measurements: ?Height: 6' 2.5" (189.2 cm) ?Weight: 115.3 kg (254 lb 3.1 oz) ?IBW/kg (Calculated) : 83.35 ? ? ?Vital Signs: ?Temp: 98.2 ?F (36.8 ?C) (04/29 0920) ?Temp Source: Oral (04/29 0920) ?BP: 118/72 (04/29 0920) ?Pulse Rate: 74 (04/29 0920) ? ?Labs: ?Recent Labs  ?  01/24/22 ?1540 01/24/22 ?1621 01/25/22 ?6948  ?HGB 12.0*  --   --   ?HCT 37.1*  --   --   ?PLT 182  --   --   ?APTT 33  --   --   ?LABPROT 17.0*  --   --   ?INR 1.4*  --   --   ?CREATININE  --  3.01* 3.14*  ? ? ?Estimated Creatinine Clearance: 28.5 mL/min (A) (by C-G formula based on SCr of 3.14 mg/dL (H)). ? ? ?Medical History: ?Past Medical History:  ?Diagnosis Date  ? Abscess 02/2017  ? LEFT GLUTEAL   ? Acid reflux   ? Alcohol abuse   ? Anemia   ? CHF (congestive heart failure) ([place])   ? CKD (chronic kidney disease) stage 4, GFR 15-29 ml/min (HCC) 04/11/2020  ? Cocaine abuse ([place])   ? DDD (degenerative disc disease), lumbar   ? Diabetes mellitus without complication ([place])   ? Dyspnea   ? Enlarged prostate   ? Hypertension   ? Seizures ([place])   ? Spinal stenosis, lumbar   ? ? ? ?Assessment: ?71 yo male with h/o DVT previously on Apixaban. Patient family member stated that patient has been refusing apixaban PTA, however patient states to MD that he does take this. MD wishes to restart.  ? ?Goal of Therapy:  ?Prevention of VTE ?Monitor platelets by anticoagulation protocol: Yes ?  ?Plan:  ?Apixaban 5mg  BID ?Pharmacy will sign off consult and follow peripherally.  ? ?Lelynd Poer A. Levada Dy, PharmD, BCPS, FNKF ?Clinical Pharmacist ?[facility] ?Please utilize [facility] for appropriate phone number to reach the unit pharmacist ([place]) ? ?01/25/2022,10:56 AM ? ? ?

## 2022-01-25 NOTE — Assessment & Plan Note (Addendum)
No overt blood loss noted.  Anemia panel reveals a ferritin of 118, iron of 8, TIBC 256.  B12 266.  Folate 8.7.  Started on B12 supplements. ?Hemoglobin low but stable.  No overt bleeding noted. ?

## 2022-01-25 NOTE — Progress Notes (Signed)
? ?[facility] ?PROGRESS NOTE ? ? Tim Bradley GBT:[id number] DOB: Sep 19, 1951 DOA: 01/24/2022  1 ?DOS: the patient was seen and examined on 01/25/2022 ? ?[facility]P: [facility] ? ?Brief History and Hospital Course:  ?71 y/o with h/o cardiomyopathy, DM, HTN, BPH, CKD, seizure disorder was noted to have a high fever at home, along with HA, chills.  Presented to the emergency department.  UA was noted to be abnormal.  He was hospitalized for further management of UTI. ? ?Consultants: None ? ?Procedures: None ? ? ? ?Subjective: ?Mentions that he is feeling slightly better this morning compared to yesterday.  Denies any chest pain shortness of breath.  Mentions that he is supposed to be taking his blood thinners. ? ? ? ?Assessment/Plan: ? ?Acute cystitis ?Patient was abnormal UA.  He was febrile.  Also had tachycardia.  Patient was admitted and started on ceftriaxone.   ?Blood culture positive for E. coli in 2 out of 4 bottles with concern for ESBL.   ?Patient has been changed over to meropenem.  Waiting on urine cultures. ? ?Sepsis secondary to UTI [facility]) ?E. coli bacteremia, ESBL ? ?Patient had evidence for sepsis with fever, tachycardia.  WBC was noted to be normal.  Initially started on ceftriaxone.  Due to concern for ESBL E. coli he has been changed over to meropenem. ? ?DVT (deep venous thrombosis) ([place]) ?There is some confusion regarding whether he is taking Eliquis or not.  Patient mentions that he is indeed taking it.  Will resume Eliquis at this time.   ? ?Dyspnea ?Complains of shortness of breath this morning.  Chest x-ray does not show any acute findings.  He is not hypoxic.  Does have a history of COPD.  We will give him DuoNebs. ? ?Essential hypertension ?Initially borderline low blood pressures noted.  Noted to be on carvedilol and hydralazine.  Monitor blood pressures closely.   ? ?CKD (chronic kidney disease) stage 4, GFR 15-29 ml/min (HCC) ?Baseline renal function seems to be between 2.5-3.5.   Renal function noted to be stable.  Avoid nephrotoxic agents.  Monitor urine output.. ? ?Type 2 diabetes mellitus with hyperlipidemia ([place]) ?Last A1C at [facility] 12/27/21 7.1 %.  Currently on SSI.  Home medication list reviewed.  Not noted to be on any glucose lowering agents prior to admission.  Monitor CBGs. ? ?BPH (benign prostatic hyperplasia) ?May continue with tamsulosin. ? ?Seizure disorder ([place]) ?Apparently on Keppra and Depakote prior to admission but he may not have been taking these medications as per H&P.  Both of these medications have been resumed.  Some interaction between meropenem and Depakote noted which may decrease the efficacy of Depakote.  Stop Depakote for now and continue just with Keppra since we do not know of recent compliance.  We will check Depakote level in the morning ? ?Chronic systolic CHF (congestive heart failure) ([place]) ?Last ECHO availalb from 04/11/20 with EF < 20%. He appears well compensated.  Noted to be on torsemide prior to admission which is currently being continued.  Beta-blocker being continued.  No ACE inhibitor or ARB due to renal dysfunction. ? ?Anemia of chronic disease ?No evidence of overt blood loss.  Monitor closely. ? ? ?Obesity ?Estimated body mass index is 32.2 kg/m? as calculated from the following: ?  Height as of this encounter: 6' 2.5" (1.892 m). ?  Weight as of this encounter: 115.3 kg. ? ? ?DVT Prophylaxis: Resume Eliquis ?Code Status: DNR ?Family Communication: Discussed with patient ?Disposition Plan: Hopefully  return home when improved.  He lives with his sister. ? ?Status is: Inpatient ?Remains inpatient appropriate because: UTI, bacteremia, sepsis ? ? ? ? ?Medications: Scheduled: ? atorvastatin  80 mg Oral Daily  ? carvedilol  6.25 mg Oral BID WC  ? divalproex  500 mg Oral Daily  ? heparin  5,000 Units Subcutaneous Q12H  ? hydrALAZINE  25 mg Oral TID  ? insulin aspart  0-20 Units Subcutaneous TID WC  ? isosorbide dinitrate  20 mg Oral TID  ? levETIRAcetam   750 mg Oral BID  ? senna  1 tablet Oral BID  ? tamsulosin  0.4 mg Oral QPC supper  ? torsemide  50 mg Oral QHS  ? ?Continuous: ? meropenem (MERREM) IV 1 g (01/25/22 1008)  ? ?[id number] **OR** acetaminophen, ketorolac, naphazoline-pheniramine ? ?Antibiotics: ?Anti-infectives (From admission, onward)  ? ? Start     Dose/Rate Route Frequency Ordered Stop  ? 01/25/22 1000  meropenem (MERREM) 1 g in sodium chloride 0.9 % 100 mL IVPB       ? 1 g ?200 mL/hr over 30 Minutes Intravenous Every 12 hours 01/25/22 0852    ? 01/24/22 1700  cefTRIAXone (ROCEPHIN) 1 g in sodium chloride 0.9 % 100 mL IVPB  Status:  Discontinued       ? 1 g ?200 mL/hr over 30 Minutes Intravenous Every 24 hours 01/24/22 2204 01/25/22 0851  ? 01/24/22 1630  cefTRIAXone (ROCEPHIN) 2 g in sodium chloride 0.9 % 100 mL IVPB       ? 2 g ?200 mL/hr over 30 Minutes Intravenous  Once 01/24/22 1624 01/24/22 1748  ? ?  ? ? ?Objective: ? ?Vital Signs ? ?Vitals:  ? 01/24/22 2329 01/25/22 9983 01/25/22 3825 01/25/22 0920  ?BP: (!) 158/69 123/78 120/68 118/72  ?Pulse: 91 93 73 74  ?Resp: 18 19 18 18   ?Temp: 99.9 ?F (37.7 ?C) (!) 101.8 ?F (38.8 ?C) 98.1 ?F (36.7 ?C) 98.2 ?F (36.8 ?C)  ?TempSrc: Oral Oral Oral Oral  ?SpO2: 97% 96% 95% 96%  ?Weight: 115.3 kg     ?Height: 6' 2.5" (1.892 m)     ? ? ?Intake/Output Summary (Last 24 hours) at 01/25/2022 1022 ?Last data filed at 01/25/2022 0900 ?Gross per 24 hour  ?Intake 2740 ml  ?Output 975 ml  ?Net 1765 ml  ? ?Filed Weights  ? 01/24/22 2329  ?Weight: 115.3 kg  ? ? ?General appearance: Awake alert.  In no distress ?Resp: Clear to auscultation bilaterally.  Normal effort ?Cardio: S1-S2 is normal regular.  No S3-S4.  No rubs murmurs or bruit ?GI: Abdomen is soft.  Nontender nondistended.  Bowel sounds are present normal.  No masses organomegaly ?Extremities: No edema.  Limited movement of lower extremities.  He is chronically bedbound. ?Neurologic: Alert and oriented x3.  No focal neurological deficits.  ? ? ?Lab  Results: ? ?Data Reviewed: I have personally reviewed labs and imaging study reports ? ?CBC: ?Recent Labs  ?Lab 01/24/22 ?1540  ?WBC 9.3  ?HGB 12.0*  ?HCT 37.1*  ?MCV 91.6  ?PLT 182  ? ? ?Basic Metabolic Panel: ?Recent Labs  ?Lab 01/24/22 ?1621 01/25/22 ?0539  ?NA 133* 134*  ?K 4.1 3.5  ?CL 102 103  ?CO2 22 20*  ?GLUCOSE 151* 201*  ?BUN 78* 77*  ?CREATININE 3.01* 3.14*  ?CALCIUM 9.2 8.7*  ? ? ?GFR: ?Estimated Creatinine Clearance: 28.5 mL/min (A) (by C-G formula based on SCr of 3.14 mg/dL (H)). ? ?Liver Function Tests: ?Recent Labs  ?Lab 01/24/22 ?  1621  ?AST 12*  ?ALT 12  ?ALKPHOS 49  ?BILITOT 0.5  ?PROT 7.3  ?ALBUMIN 3.4*  ? ? ? ?Coagulation Profile: ?Recent Labs  ?Lab 01/24/22 ?1540  ?INR 1.4*  ? ? ? ?CBG: ?Recent Labs  ?Lab 01/24/22 ?2331 01/25/22 ?0726  ?GLUCAP 269* 182*  ? ? ? ?Recent Results (from the past 240 hour(s))  ?Blood Culture (routine x 2)     Status: None (Preliminary result)  ? Collection Time: 01/24/22  4:10 PM  ? Specimen: BLOOD  ?Result Value Ref Range Status  ? Specimen Description BLOOD RIGHT ANTECUBITAL  Final  ? Special Requests   Final  ?  BOTTLES DRAWN AEROBIC AND ANAEROBIC Blood Culture adequate volume  ? Culture  Setup Time   Final  ?  GRAM NEGATIVE RODS ?IN BOTH AEROBIC AND ANAEROBIC BOTTLES ?CRITICAL RESULT CALLED TO, READ BACK BY AND VERIFIED WITH: PHARMD K PIERCE [id number] AT 843 AM BY CM ?Performed at [facility], [place] [address], [place], [place] [address] ?  ? Culture GRAM NEGATIVE RODS  Final  ? Report Status PENDING  Incomplete  ?Blood Culture ID Panel (Reflexed)     Status: Abnormal (Preliminary result)  ? Collection Time: 01/24/22  4:10 PM  ?Result Value Ref Range Status  ? Enterococcus faecalis NOT DETECTED NOT DETECTED Final  ? Enterococcus Faecium NOT DETECTED NOT DETECTED Final  ? Listeria monocytogenes NOT DETECTED NOT DETECTED Final  ? Staphylococcus species NOT DETECTED NOT DETECTED Final  ? Staphylococcus aureus (BCID) NOT DETECTED NOT DETECTED Final  ?  Staphylococcus epidermidis NOT DETECTED NOT DETECTED Final  ? Staphylococcus lugdunensis NOT DETECTED NOT DETECTED Final  ? Streptococcus species NOT DETECTED NOT DETECTED Final  ? Streptococcus agalactiae NOT DETECTED N

## 2022-01-25 NOTE — Progress Notes (Signed)
?   01/25/22 0359  ?Assess: MEWS Score  ?Temp (!) 101.8 ?F (38.8 ?C)  ?BP 123/78  ?Pulse Rate 93  ?Resp 19  ?SpO2 96 %  ?Assess: MEWS Score  ?MEWS Temp 2  ?MEWS Systolic 0  ?MEWS Pulse 0  ?MEWS RR 0  ?MEWS LOC 0  ?MEWS Score 2  ?MEWS Score Color Yellow  ?Assess: if the MEWS score is Yellow or Red  ?Were vital signs taken at a resting state? Yes  ?Focused Assessment Change from prior assessment (see assessment flowsheet)  ?Early Detection of Sepsis Score *See Row Information* High  ?MEWS guidelines implemented *See Row Information* Yes  ?Treat  ?MEWS Interventions Administered prn meds/treatments  ?Pain Scale 0-10  ?Pain Score 0  ?Take Vital Signs  ?Increase Vital Sign Frequency  Yellow: Q 2hr X 2 then Q 4hr X 2, if remains yellow, continue Q 4hrs  ?Escalate  ?MEWS: Escalate Yellow: discuss with charge nurse/RN and consider discussing with provider and RRT  ?Notify: Charge Nurse/RN  ?Name of Charge Nurse/RN Notified Malachy Mood, RN  ?Date Charge Nurse/RN Notified 01/25/22  ?Time Charge Nurse/RN Notified [phone]  ?Notify: Provider  ?Provider Name/Title N/A  ?Notify: Rapid Response  ?Name of Rapid Response RN Notified N/A  ?Document  ?Patient Outcome Not stable and remains on department  ?Progress note created (see row info) Yes  ? ? ?

## 2022-01-25 NOTE — Assessment & Plan Note (Addendum)
Baseline renal function seems to be between 2.5-3.5.  Creatinine stable for the most part.  Continue to monitor. ?

## 2022-01-25 NOTE — Assessment & Plan Note (Addendum)
History of COPD ? ?Dyspnea thought to be secondary to COPD.  Chest x-ray did not show any infiltrates.  Started on nebulizer treatments and inhaled steroids with improvement in symptoms.   ?

## 2022-01-25 NOTE — Progress Notes (Addendum)
PHARMACY - PHYSICIAN COMMUNICATION ?CRITICAL VALUE ALERT - BLOOD CULTURE IDENTIFICATION (BCID) ? ?Tim Bradley is an 71 y.o. male who presented to [facility] on 01/24/2022 with a chief complaint of high fever with HA anc chills.  ? ?Assessment:  Blood cultures positive for E.coli 2 of 4 bottles with CTXM resistance with possible urinary source. ? ?Name of physician (or Provider) Contacted: Bonnielee Haff ? ?Current antibiotics: Ceftriaxone 1gm IV q24h ? ?Changes to prescribed antibiotics recommended:  ?Will change to meropenem 1gm IV q12h due to CTXM resistance.  ? ?Noted patient on divalproex for seizures, would recommend alternate AED while on meropenem. MD informed.  ? ?Results for orders placed or performed during the hospital encounter of 01/24/22  ?Blood Culture ID Panel (Reflexed) (Collected: 01/24/2022  4:10 PM)  ?Result Value Ref Range  ? Enterococcus faecalis NOT DETECTED NOT DETECTED  ? Enterococcus Faecium NOT DETECTED NOT DETECTED  ? Listeria monocytogenes NOT DETECTED NOT DETECTED  ? Staphylococcus species NOT DETECTED NOT DETECTED  ? Staphylococcus aureus (BCID) NOT DETECTED NOT DETECTED  ? Staphylococcus epidermidis NOT DETECTED NOT DETECTED  ? Staphylococcus lugdunensis NOT DETECTED NOT DETECTED  ? Streptococcus species NOT DETECTED NOT DETECTED  ? Streptococcus agalactiae NOT DETECTED NOT DETECTED  ? Streptococcus pneumoniae NOT DETECTED NOT DETECTED  ? Streptococcus pyogenes NOT DETECTED NOT DETECTED  ? A.calcoaceticus-baumannii NOT DETECTED NOT DETECTED  ? Bacteroides fragilis NOT DETECTED NOT DETECTED  ? Enterobacterales PENDING NOT DETECTED  ? Enterobacter cloacae complex NOT DETECTED NOT DETECTED  ? Escherichia coli DETECTED (A) NOT DETECTED  ? Klebsiella aerogenes NOT DETECTED NOT DETECTED  ? Klebsiella oxytoca NOT DETECTED NOT DETECTED  ? Klebsiella pneumoniae NOT DETECTED NOT DETECTED  ? Proteus species NOT DETECTED NOT DETECTED  ? Salmonella species NOT DETECTED NOT DETECTED  ? Serratia  marcescens NOT DETECTED NOT DETECTED  ? Haemophilus influenzae NOT DETECTED NOT DETECTED  ? Neisseria meningitidis NOT DETECTED NOT DETECTED  ? Pseudomonas aeruginosa NOT DETECTED NOT DETECTED  ? Stenotrophomonas maltophilia NOT DETECTED NOT DETECTED  ? Candida albicans NOT DETECTED NOT DETECTED  ? Candida auris NOT DETECTED NOT DETECTED  ? Candida glabrata NOT DETECTED NOT DETECTED  ? Candida krusei NOT DETECTED NOT DETECTED  ? Candida parapsilosis NOT DETECTED NOT DETECTED  ? Candida tropicalis NOT DETECTED NOT DETECTED  ? Cryptococcus neoformans/gattii NOT DETECTED NOT DETECTED  ? CTX-M ESBL DETECTED (A) NOT DETECTED  ? Carbapenem resistance IMP NOT DETECTED NOT DETECTED  ? Carbapenem resistance KPC NOT DETECTED NOT DETECTED  ? Carbapenem resistance NDM NOT DETECTED NOT DETECTED  ? Carbapenem resist OXA 48 LIKE NOT DETECTED NOT DETECTED  ? Carbapenem resistance VIM NOT DETECTED NOT DETECTED  ? ?Jakalyn Kratky A. Levada Dy, PharmD, BCPS, FNKF ?Clinical Pharmacist ?[place] ?Please utilize Amion for appropriate phone number to reach the unit pharmacist ([place]) ? ?01/25/2022  8:47 AM ? ?

## 2022-01-25 NOTE — Progress Notes (Signed)
NEW ADMISSION NOTE ?New Admission Note:  ? ?Arrival Method: Stretcher via ED ?Mental Orientation: Alert and Oriented x4 ?Telemetry: None ?Assessment: Completed ?Skin: Deep tissue injury on right grand toe. Ecchymosis on lower back.  ?IV: Right AC, Saline Locked.  ?Pain: None, no pain.  ?Tubes: None ?Safety Measures: Safety Fall Prevention Plan has been given, discussed and signed ?Admission: Completed ?[facility] Orientation: Patient has been orientated to the room, unit and staff.  ?Family: Not present at bedside.  ? ?Orders have been reviewed and implemented. Will continue to monitor the patient. Call light has been placed within reach and bed alarm has been activated.  ? ?Sharmon Revere, RN   ?

## 2022-01-25 NOTE — Hospital Course (Addendum)
71 y/o with h/o cardiomyopathy, DM, HTN, BPH, CKD, seizure disorder was noted to have a high fever at home, along with HA, chills.  Presented to the emergency department.  UA was noted to be abnormal.  He was hospitalized for further management of UTI.  Subsequently found to be bacteremic as well.  Noted to have ESBL E. coli. ?

## 2022-01-26 ENCOUNTER — Inpatient Hospital Stay (HOSPITAL_COMMUNITY): Payer: No Typology Code available for payment source

## 2022-01-26 DIAGNOSIS — D638 Anemia in other chronic diseases classified elsewhere: Secondary | ICD-10-CM

## 2022-01-26 DIAGNOSIS — N184 Chronic kidney disease, stage 4 (severe): Secondary | ICD-10-CM | POA: Diagnosis not present

## 2022-01-26 DIAGNOSIS — N3000 Acute cystitis without hematuria: Secondary | ICD-10-CM | POA: Diagnosis not present

## 2022-01-26 DIAGNOSIS — I5022 Chronic systolic (congestive) heart failure: Secondary | ICD-10-CM | POA: Diagnosis not present

## 2022-01-26 DIAGNOSIS — R5381 Other malaise: Secondary | ICD-10-CM | POA: Diagnosis present

## 2022-01-26 DIAGNOSIS — F418 Other specified anxiety disorders: Secondary | ICD-10-CM | POA: Diagnosis present

## 2022-01-26 DIAGNOSIS — I428 Other cardiomyopathies: Secondary | ICD-10-CM

## 2022-01-26 LAB — ECHOCARDIOGRAM COMPLETE
AR max vel: 2.49 cm2
AV Area VTI: 2.38 cm2
AV Area mean vel: 2.47 cm2
AV Mean grad: 3 mmHg
AV Peak grad: 5.4 mmHg
Ao pk vel: 1.16 m/s
Height: 74.5 in
MV VTI: 1.8 cm2
S' Lateral: 4.4 cm
Weight: 4067.05 oz

## 2022-01-26 LAB — IRON AND TIBC
Iron: 8 ug/dL — ABNORMAL LOW (ref 45–182)
Saturation Ratios: 3 % — ABNORMAL LOW (ref 17.9–39.5)
TIBC: 256 ug/dL (ref 250–450)
UIBC: 248 ug/dL

## 2022-01-26 LAB — RETICULOCYTES
Immature Retic Fract: 8.7 % (ref 2.3–15.9)
RBC.: 3.42 MIL/uL — ABNORMAL LOW (ref 4.22–5.81)
Retic Count, Absolute: 46.9 10*3/uL (ref 19.0–186.0)
Retic Ct Pct: 1.4 % (ref 0.4–3.1)

## 2022-01-26 LAB — BASIC METABOLIC PANEL
Anion gap: 10 (ref 5–15)
BUN: 82 mg/dL — ABNORMAL HIGH (ref 8–23)
CO2: 18 mmol/L — ABNORMAL LOW (ref 22–32)
Calcium: 8.4 mg/dL — ABNORMAL LOW (ref 8.9–10.3)
Chloride: 105 mmol/L (ref 98–111)
Creatinine, Ser: 3.19 mg/dL — ABNORMAL HIGH (ref 0.61–1.24)
GFR, Estimated: 20 mL/min — ABNORMAL LOW (ref >60)
Glucose, Bld: 149 mg/dL — ABNORMAL HIGH (ref 70–99)
Potassium: 4.1 mmol/L (ref 3.5–5.1)
Sodium: 133 mmol/L — ABNORMAL LOW (ref 135–145)

## 2022-01-26 LAB — CBC
HCT: 31.3 % — ABNORMAL LOW (ref 39.0–52.0)
Hemoglobin: 10.2 g/dL — ABNORMAL LOW (ref 13.0–17.0)
MCH: 29.3 pg (ref 26.0–34.0)
MCHC: 32.6 g/dL (ref 30.0–36.0)
MCV: 89.9 fL (ref 80.0–100.0)
Platelets: 130 10*3/uL — ABNORMAL LOW (ref 150–400)
RBC: 3.48 MIL/uL — ABNORMAL LOW (ref 4.22–5.81)
RDW: 14.3 % (ref 11.5–15.5)
WBC: 6.9 10*3/uL (ref 4.0–10.5)
nRBC: 0 % (ref 0.0–0.2)

## 2022-01-26 LAB — GLUCOSE, CAPILLARY
Glucose-Capillary: 118 mg/dL — ABNORMAL HIGH (ref 70–99)
Glucose-Capillary: 107 mg/dL — ABNORMAL HIGH (ref 70–99)
Glucose-Capillary: 127 mg/dL — ABNORMAL HIGH (ref 70–99)
Glucose-Capillary: 123 mg/dL — ABNORMAL HIGH (ref 70–99)

## 2022-01-26 LAB — FOLATE: Folate: 8.7 ng/mL (ref >5.9)

## 2022-01-26 LAB — FERRITIN: Ferritin: 118 ng/mL (ref 24–336)

## 2022-01-26 LAB — VITAMIN B12: Vitamin B-12: 266 pg/mL (ref 180–914)

## 2022-01-26 LAB — VALPROIC ACID LEVEL: Valproic Acid Lvl: <10 ug/mL — ABNORMAL LOW (ref 50.0–100.0)

## 2022-01-26 MED ORDER — IPRATROPIUM-ALBUTEROL 0.5-2.5 (3) MG/3ML IN SOLN
3.0000 mL | RESPIRATORY_TRACT | Status: DC | PRN
  Administered 2022-01-26 – 2022-01-29 (×3): 3 mL via RESPIRATORY_TRACT
  Filled 2022-01-26 (×2): qty 3

## 2022-01-26 MED ORDER — POLYETHYLENE GLYCOL 3350 17 G PO PACK
17.0000 g | PACK | Freq: Every day | ORAL | Status: DC
  Administered 2022-01-27: 17 g via ORAL
  Filled 2022-01-26 (×2): qty 1

## 2022-01-26 MED ORDER — MOMETASONE FURO-FORMOTEROL FUM 200-5 MCG/ACT IN AERO
2.0000 | INHALATION_SPRAY | Freq: Two times a day (BID) | RESPIRATORY_TRACT | Status: DC
  Administered 2022-01-26 – 2022-01-31 (×11): 2 via RESPIRATORY_TRACT
  Filled 2022-01-26: qty 8.8

## 2022-01-26 MED ORDER — PERFLUTREN LIPID MICROSPHERE
1.0000 mL | INTRAVENOUS | Status: AC | PRN
  Administered 2022-01-26: 3 mL via INTRAVENOUS
  Filled 2022-01-26: qty 10

## 2022-01-26 MED ORDER — ALPRAZOLAM 0.25 MG PO TABS
0.2500 mg | ORAL_TABLET | Freq: Two times a day (BID) | ORAL | Status: DC | PRN
  Administered 2022-01-26 – 2022-01-27 (×3): 0.25 mg via ORAL
  Filled 2022-01-26 (×3): qty 1

## 2022-01-26 MED ORDER — VITAMIN B-12 1000 MCG PO TABS
1000.0000 ug | ORAL_TABLET | Freq: Every day | ORAL | Status: DC
  Administered 2022-01-26 – 2022-01-31 (×6): 1000 ug via ORAL
  Filled 2022-01-26 (×6): qty 1

## 2022-01-26 MED ORDER — SENNOSIDES-DOCUSATE SODIUM 8.6-50 MG PO TABS
2.0000 | ORAL_TABLET | Freq: Two times a day (BID) | ORAL | Status: DC
  Administered 2022-01-26 – 2022-01-30 (×8): 2 via ORAL
  Filled 2022-01-26 (×11): qty 2

## 2022-01-26 MED ORDER — TORSEMIDE 20 MG PO TABS
20.0000 mg | ORAL_TABLET | Freq: Every day | ORAL | Status: DC
  Administered 2022-01-26 – 2022-01-31 (×6): 20 mg via ORAL
  Filled 2022-01-26 (×6): qty 1

## 2022-01-26 NOTE — Assessment & Plan Note (Addendum)
Low-dose Xanax. ?

## 2022-01-26 NOTE — Assessment & Plan Note (Addendum)
Started on bowel regimen.  Still waiting on a bowel movement.  Dose of MiraLAX was increased yesterday.  Still no bowel movement documented.  We will give Dulcolax suppository. ?

## 2022-01-26 NOTE — Progress Notes (Addendum)
? ?[facility] ?PROGRESS NOTE ? ? MARJORIE LUSSIER HCW:[id number] DOB: Sep 07, 1951 DOA: 01/24/2022  2 ?DOS: the patient was seen and examined on 01/26/2022 ? ?[facility]P: [facility] ? ?Brief History and Hospital Course:  ?71 y/o with h/o cardiomyopathy, DM, HTN, BPH, CKD, seizure disorder was noted to have a high fever at home, along with HA, chills.  Presented to the emergency department.  UA was noted to be abnormal.  He was hospitalized for further management of UTI.  Subsequently found to be bacteremic as well.  Concern for ESBL E. coli. ? ?Consultants: None ? ?Procedures: None ? ? ? ?Subjective: ?Shortness of breath has improved.  Complains of some anxiety this morning.  No bowel movement in a few days.  No other complaints offered.   ? ? ? ?Assessment/Plan: ? ?Acute cystitis ?Patient was abnormal UA.  He was febrile.  Also had tachycardia.  Patient was admitted and started on ceftriaxone.   ?Blood culture positive for E. coli in 2 out of 4 bottles with concern for ESBL.   ?Patient changed over to meropenem.  WBC is normal.  Urine culture is pending. ? ?Sepsis secondary to UTI [facility]) ?E. coli bacteremia, ESBL ? ?Patient had evidence for sepsis with fever, tachycardia.  WBC was noted to be normal.  Initially started on ceftriaxone.  Due to concern for ESBL E. coli he has been changed over to meropenem.  Waiting on final identification and sensitivities. ? ?DVT (deep venous thrombosis) ([place]) ?There was some confusion regarding whether he is taking Eliquis or not.  Patient mentions that he is indeed taking it.  Eliquis was resumed. ? ?Dyspnea ?History of COPD ? ?Mentions some shortness of breath yesterday.  He has a history of's smoking. Has underlying COPD.  Given nebulizer treatment with some improvement in symptoms.  He is saturating normal on room air.  Continue current treatment for now.  May need to initiate inhaled steroids.   ? ?Essential hypertension ?Blood pressure reasonably well controlled. ? ?CKD  (chronic kidney disease) stage 4, GFR 15-29 ml/min (HCC) ?Baseline renal function seems to be between 2.5-3.5.  Renal function noted to be stable.  Avoid nephrotoxic agents.  Monitor urine output. ? ?Type 2 diabetes mellitus with hyperlipidemia ([place]) ?Last A1C at [facility] 12/27/21 7.1 %.  Currently on SSI.  Home medication list reviewed.  Not noted to be on any glucose lowering agents prior to admission.  Monitor CBGs. ? ?BPH (benign prostatic hyperplasia) ?May continue with tamsulosin. ? ?Seizure disorder ([place]) ?Apparently on Keppra and Depakote prior to admission but he may not have been taking these medications as per H&P.   ?Some interaction between meropenem and Depakote noted which may decrease the efficacy of Depakote.  Stop Depakote for now and continue just with Keppra since we do not know of recent compliance.  Depakote level noted to be subtherapeutic. ? ?Chronic systolic CHF (congestive heart failure) ([place]) ?Last ECHO availalb from 04/11/20 with EF < 20%. He appears well compensated.  Noted to be on torsemide prior to admission which is currently being continued.  Beta-blocker being continued.  No ACE inhibitor or ARB due to renal dysfunction. ? ?Physical deconditioning ?Patient apparently quite deconditioned.  For the last 8 months he has not been able to get up and walk.  Discussed with the sister.  She feels that this is because he has not received adequate physical and Occupational Therapy.  Prior to 8 months ago he was able to ambulate.  She mentions that she  has been finding it very difficult to care for him.  Will involve PT and OT and TOC to assist patient and family. ? ?Situational anxiety ?Complains of anxiety this morning.  Xanax at low-dose will be ordered. ? ?Anemia of chronic disease ?No overt blood loss noted.  Anemia panel reveals a ferritin of 118, iron of 8, TIBC 256.  B12 266.  Folate 8.7.  Will start supplementing B12. ? ?Constipation ?Has not had a bowel movement in a few days.  Initiate  bowel regimen. ? ? ?Obesity ?Estimated body mass index is 32.2 kg/m? as calculated from the following: ?  Height as of this encounter: 6' 2.5" (1.892 m). ?  Weight as of this encounter: 115.3 kg. ? ? ?DVT Prophylaxis: Eliquis ?Code Status: DNR ?Family Communication: Discussed with patient ?Disposition Plan: Hopefully return home when improved.  He lives with his sister.   ? ?Status is: Inpatient ?Remains inpatient appropriate because: UTI, bacteremia, sepsis ? ? ? ? ?Medications: Scheduled: ? apixaban  5 mg Oral BID  ? atorvastatin  80 mg Oral Daily  ? carvedilol  6.25 mg Oral BID WC  ? hydrALAZINE  25 mg Oral TID  ? insulin aspart  0-20 Units Subcutaneous TID WC  ? isosorbide dinitrate  20 mg Oral TID  ? levETIRAcetam  750 mg Oral BID  ? mometasone-formoterol  2 puff Inhalation BID  ? polyethylene glycol  17 g Oral Daily  ? senna-docusate  2 tablet Oral BID  ? tamsulosin  0.4 mg Oral QPC supper  ? torsemide  20 mg Oral QHS  ? vitamin B-12  1,000 mcg Oral Daily  ? ?Continuous: ? meropenem (MERREM) IV 1 g (01/26/22 0857)  ? ?VOZ:[id number] **OR** acetaminophen, ALPRAZolam, guaiFENesin-dextromethorphan, ipratropium-albuterol, ketorolac, naphazoline-pheniramine ? ?Antibiotics: ?Anti-infectives (From admission, onward)  ? ? Start     Dose/Rate Route Frequency Ordered Stop  ? 01/25/22 1000  meropenem (MERREM) 1 g in sodium chloride 0.9 % 100 mL IVPB       ? 1 g ?200 mL/hr over 30 Minutes Intravenous Every 12 hours 01/25/22 0852    ? 01/24/22 1700  cefTRIAXone (ROCEPHIN) 1 g in sodium chloride 0.9 % 100 mL IVPB  Status:  Discontinued       ? 1 g ?200 mL/hr over 30 Minutes Intravenous Every 24 hours 01/24/22 2204 01/25/22 0851  ? 01/24/22 1630  cefTRIAXone (ROCEPHIN) 2 g in sodium chloride 0.9 % 100 mL IVPB       ? 2 g ?200 mL/hr over 30 Minutes Intravenous  Once 01/24/22 1624 01/24/22 1748  ? ?  ? ? ?Objective: ? ?Vital Signs ? ?Vitals:  ? 01/25/22 2119 01/26/22 0459 01/26/22 5638 01/26/22 0917  ?BP: 127/86 122/61   108/66  ?Pulse: (!) 101 83 90 91  ?Resp: 17 18 18 18   ?Temp: 98.8 ?F (37.1 ?C) 99.8 ?F (37.7 ?C)  98.5 ?F (36.9 ?C)  ?TempSrc: Oral Oral  Oral  ?SpO2: 93% 100% 93% 97%  ?Weight:      ?Height:      ? ? ?Intake/Output Summary (Last 24 hours) at 01/26/2022 1135 ?Last data filed at 01/26/2022 0900 ?Gross per 24 hour  ?Intake 1520.13 ml  ?Output 1975 ml  ?Net -454.87 ml  ? ?Filed Weights  ? 01/24/22 2329  ?Weight: 115.3 kg  ? ? ?General appearance: Awake alert.  In no distress ?Resp: Clear to auscultation bilaterally.  Normal effort ?Cardio: S1-S2 is normal regular.  No S3-S4.  No rubs murmurs or bruit ?GI: Abdomen  is soft.  Nontender nondistended.  Bowel sounds are present normal.  No masses organomegaly ?Extremities: No edema.   ?Neurologic: Alert and oriented x3.  No focal neurological deficits.  ? ? ? ?Lab Results: ? ?Data Reviewed: I have personally reviewed labs and imaging study reports ? ?CBC: ?Recent Labs  ?Lab 01/24/22 ?1540 01/26/22 ?0112  ?WBC 9.3 6.9  ?HGB 12.0* 10.2*  ?HCT 37.1* 31.3*  ?MCV 91.6 89.9  ?PLT 182 130*  ? ? ?Basic Metabolic Panel: ?Recent Labs  ?Lab 01/24/22 ?1621 01/25/22 ?7494 01/26/22 ?0112  ?NA 133* 134* 133*  ?K 4.1 3.5 4.1  ?CL 102 103 105  ?CO2 22 20* 18*  ?GLUCOSE 151* 201* 149*  ?BUN 78* 77* 82*  ?CREATININE 3.01* 3.14* 3.19*  ?CALCIUM 9.2 8.7* 8.4*  ? ? ?GFR: ?Estimated Creatinine Clearance: 28.1 mL/min (A) (by C-G formula based on SCr of 3.19 mg/dL (H)). ? ?Liver Function Tests: ?Recent Labs  ?Lab 01/24/22 ?1621  ?AST 12*  ?ALT 12  ?ALKPHOS 49  ?BILITOT 0.5  ?PROT 7.3  ?ALBUMIN 3.4*  ? ? ? ?Coagulation Profile: ?Recent Labs  ?Lab 01/24/22 ?1540  ?INR 1.4*  ? ? ? ?CBG: ?Recent Labs  ?Lab 01/25/22 ?1121 01/25/22 ?1653 01/25/22 ?2119 01/26/22 ?0731 01/26/22 ?1131  ?GLUCAP 157* 102* 120* 118* 107*  ? ? ? ?Recent Results (from the past 240 hour(s))  ?Blood Culture (routine x 2)     Status: Abnormal (Preliminary result)  ? Collection Time: 01/24/22  4:10 PM  ? Specimen: BLOOD  ?Result Value  Ref Range Status  ? Specimen Description BLOOD RIGHT ANTECUBITAL  Final  ? Special Requests   Final  ?  BOTTLES DRAWN AEROBIC AND ANAEROBIC Blood Culture adequate volume  ? Culture  Setup Time   Final  ?  GRAM

## 2022-01-26 NOTE — Assessment & Plan Note (Addendum)
Patient apparently quite deconditioned.  For the last 8 months he has not been able to get up and walk.  Discussed with the sister.  She feels that this is because he has not received adequate physical and Occupational Therapy.  Prior to 8 months ago he was apparently able to ambulate.  She mentions that she has been finding it very difficult to care for him even though he does have caregivers occasionally.  PT and OT was consulted.  Inpatient rehabilitation was recommended.  Patient is now agreeable. ?

## 2022-01-26 NOTE — Progress Notes (Signed)
?  Echocardiogram ?2D Echocardiogram has been performed. ? ?Tim Bradley ?01/26/2022, 4:03 PM ?

## 2022-01-27 DIAGNOSIS — N184 Chronic kidney disease, stage 4 (severe): Secondary | ICD-10-CM | POA: Diagnosis not present

## 2022-01-27 DIAGNOSIS — I5022 Chronic systolic (congestive) heart failure: Secondary | ICD-10-CM | POA: Diagnosis not present

## 2022-01-27 DIAGNOSIS — D638 Anemia in other chronic diseases classified elsewhere: Secondary | ICD-10-CM | POA: Diagnosis not present

## 2022-01-27 DIAGNOSIS — N3000 Acute cystitis without hematuria: Secondary | ICD-10-CM | POA: Diagnosis not present

## 2022-01-27 LAB — GLUCOSE, CAPILLARY
Glucose-Capillary: 122 mg/dL — ABNORMAL HIGH (ref 70–99)
Glucose-Capillary: 146 mg/dL — ABNORMAL HIGH (ref 70–99)
Glucose-Capillary: 174 mg/dL — ABNORMAL HIGH (ref 70–99)
Glucose-Capillary: 110 mg/dL — ABNORMAL HIGH (ref 70–99)

## 2022-01-27 LAB — BASIC METABOLIC PANEL
Anion gap: 10 (ref 5–15)
BUN: 90 mg/dL — ABNORMAL HIGH (ref 8–23)
CO2: 21 mmol/L — ABNORMAL LOW (ref 22–32)
Calcium: 8.7 mg/dL — ABNORMAL LOW (ref 8.9–10.3)
Chloride: 107 mmol/L (ref 98–111)
Creatinine, Ser: 3.54 mg/dL — ABNORMAL HIGH (ref 0.61–1.24)
GFR, Estimated: 17 mL/min — ABNORMAL LOW (ref >60)
Glucose, Bld: 120 mg/dL — ABNORMAL HIGH (ref 70–99)
Potassium: 3.7 mmol/L (ref 3.5–5.1)
Sodium: 138 mmol/L (ref 135–145)

## 2022-01-27 LAB — CULTURE, BLOOD (ROUTINE X 2): Special Requests: ADEQUATE

## 2022-01-27 LAB — CBC
HCT: 29 % — ABNORMAL LOW (ref 39.0–52.0)
Hemoglobin: 9.3 g/dL — ABNORMAL LOW (ref 13.0–17.0)
MCH: 28.6 pg (ref 26.0–34.0)
MCHC: 32.1 g/dL (ref 30.0–36.0)
MCV: 89.2 fL (ref 80.0–100.0)
Platelets: 160 10*3/uL (ref 150–400)
RBC: 3.25 MIL/uL — ABNORMAL LOW (ref 4.22–5.81)
RDW: 14.1 % (ref 11.5–15.5)
WBC: 4.3 10*3/uL (ref 4.0–10.5)
nRBC: 0 % (ref 0.0–0.2)

## 2022-01-27 MED ORDER — LEVETIRACETAM 500 MG PO TABS
500.0000 mg | ORAL_TABLET | Freq: Two times a day (BID) | ORAL | Status: DC
  Administered 2022-01-27 – 2022-01-30 (×6): 500 mg via ORAL
  Filled 2022-01-27 (×6): qty 1

## 2022-01-27 MED ORDER — POLYETHYLENE GLYCOL 3350 17 G PO PACK
17.0000 g | PACK | Freq: Two times a day (BID) | ORAL | Status: DC
Start: 2022-01-27 — End: 2022-02-01
  Administered 2022-01-27 – 2022-01-30 (×5): 17 g via ORAL
  Filled 2022-01-27 (×9): qty 1

## 2022-01-27 NOTE — Evaluation (Signed)
Physical Therapy Evaluation Patient Details Name: Tim Bradley MRN: [id number] DOB: May 13, 1951 Today's Date: 01/27/2022  History of Present Illness  71 yo male admitted with high fever , HA, and chills. UT (+) Pt with sepsis secondary to UTI e coli bacteria Pt using w/c at baseline with previous SNF admission PMH DM HTN BPH CKD seizure disorder COPD anixety DVT on eliquis  Clinical Impression   Pt admitted with above diagnosis. Lives at home alone, in a single-level apartment with a level entry; Prior to admission, pt was able to trnasfer to his power chair without assist, though though precarious with losses of balance; Sister is POA, and helps him as well as a care attendant (pays out of pocket); He is familiar with sliding board use, though never received one from most recent post-acute rehab stay (near [place]); Presents to PT with generalized weakness, functional dependencies;   Pt is very motivated to get to an independent functional level, and shows potential not only for basic wheelchair transfers, but also for strengthening, and increasing standing tolerance and balance; He shows good activity tolerance, and could have practiced standing/transfers longer this session; He has family  involved in his care;  Strongly recommend Acute Inpatient Rehab to maximize independence and safety with mobility and ADLs;   Pt currently with functional limitations due to the deficits listed below (see PT Problem List). Pt will benefit from skilled PT to increase their independence and safety with mobility to allow discharge to the venue listed below.          Recommendations for follow up therapy are one component of a multi-disciplinary discharge planning process, led by the attending physician.  Recommendations may be updated based on patient status, additional functional criteria and insurance authorization.  Follow Up Recommendations Acute inpatient rehab (3hours/day)    Assistance Recommended at  Discharge Intermittent Supervision/Assistance  Patient can return home with the following  A lot of help with walking and/or transfers    Equipment Recommendations Other (comment) (sliding board; drop-arm 3in1)  Recommendations for Other Services  OT consult (as ordered)   TOC: does his current insurance cover a Personal Care Attendant?   Functional Status Assessment Patient has had a recent decline in their functional status and demonstrates the ability to make significant improvements in function in a reasonable and predictable amount of time.     Precautions / Restrictions Precautions Precautions: Fall      Mobility  Bed Mobility Overal bed mobility: Needs Assistance Bed Mobility: Rolling, Supine to Sit Rolling: Min guard   Supine to sit: Mod assist, HOB elevated     General bed mobility comments: pt requires step by step sequence to power up from bed surface. pt unable to push with L UE off bed surface without (A) . pt with a posterior pelvic tilt    Transfers Overall transfer level: Needs assistance Equipment used: 2 person hand held assist Transfers: Sit to/from Stand Sit to Stand: +2 physical assistance, Mod assist, From elevated surface           General transfer comment: using stedy pt able to power from from surface. pt needs (A) to progress feet to floor and hips to EOB in prep for standing; shows excellent effort with pushing up    Ambulation/Gait                  Stairs            Wheelchair Mobility    Modified Rankin (Stroke Patients  Only)       Balance     Sitting balance-Leahy Scale: Fair Sitting balance - Comments: Initially heavy dependence on UEs to stabilize, with time and cues, able to sit up and maintain sitting balance with hands resting on knees     Standing balance-Leahy Scale: Poor Standing balance comment: posterior bias                             Pertinent Vitals/Pain      Home Living  Family/patient expects to be discharged to:: Private residence Living Arrangements: Other relatives Available Help at Discharge: Family;Personal care attendant Type of Home: Apartment Home Access: Level entry       Home Layout: One level Home Equipment: Agricultural consultant (2 wheels);BSC/3in1;Wheelchair - manual;Wheelchair - power;Hospital bed;Other (comment) (has hoyer at home but not using) Additional Comments: Pt is interested in seeing if he can get a consistent personal care attentdant through insurance    Prior Function Prior Level of Function : Needs assist             Mobility Comments: Pt states that recently he has been able to lateral scoot to his power chair independently "more or less"; Sister and PCAs have assisted with trnsfers, seems like not 100% of the time; Pt indicated he does not have a sliding board ADLs Comments: Reports sister assists with ADL tasks. Reports he has been performing bathing at bed level.     Hand Dominance   Dominant Hand: Left    Extremity/Trunk Assessment   Upper Extremity Assessment Upper Extremity Assessment: Generalized weakness    Lower Extremity Assessment Lower Extremity Assessment: Generalized weakness;RLE deficits/detail;LLE deficits/detail RLE Deficits / Details: Strength grossly 3+/5; tends to use UEs to help RLE move when sitting EOB; stronger than LLE LLE Deficits / Details: Strength grossly 3+/5; tends to use UEs to help RLE move when sitting EOB; Weaker than RLE, and uses UEs to position LLE when in bed and sitting EOB    Cervical / Trunk Assessment Cervical / Trunk Assessment: Kyphotic  Communication   Communication: No difficulties  Cognition                                                General Comments General comments (skin integrity, edema, etc.): worked on lateral scoots at EOB with good success; attemtped scooting to chair without armrests, however chair was too light and easy to scoot, and  we stopped; pt could use a drop-arm recliner in his room    Exercises     Assessment/Plan    PT Assessment Patient needs continued PT services  PT Problem List Decreased strength;Decreased range of motion;Decreased activity tolerance;Decreased balance;Decreased mobility;Decreased coordination;Decreased knowledge of use of DME;Decreased safety awareness;Decreased knowledge of precautions;Obesity       PT Treatment Interventions DME instruction;Gait training;Functional mobility training;Therapeutic activities;Therapeutic exercise;Balance training;Neuromuscular re-education;Cognitive remediation;Patient/family education;Wheelchair mobility training    PT Goals (Current goals can be found in the Care Plan section)  Acute Rehab PT Goals Patient Stated Goal: To be independent PT Goal Formulation: With patient Time For Goal Achievement: 02/10/22 Potential to Achieve Goals: Good    Frequency Min 3X/week     Co-evaluation PT/OT/SLP Co-Evaluation/Treatment: Yes Reason for Co-Treatment: To address functional/ADL transfers;For patient/therapist safety PT goals addressed during session: Mobility/safety with mobility  AM-PAC PT "6 Clicks" Mobility  Outcome Measure Help needed turning from your back to your side while in a flat bed without using bedrails?: A Little Help needed moving from lying on your back to sitting on the side of a flat bed without using bedrails?: A Lot Help needed moving to and from a bed to a chair (including a wheelchair)?: A Lot Help needed standing up from a chair using your arms (e.g., wheelchair or bedside chair)?: Total Help needed to walk in hospital room?: Total Help needed climbing 3-5 steps with a railing? : Total 6 Click Score: 10    End of Session Equipment Utilized During Treatment: Gait belt Activity Tolerance: Patient tolerated treatment well Patient left: in chair;with call bell/phone within reach;with chair alarm set Nurse Communication:  Mobility status;Need for lift equipment Antony Salmon is helpful) PT Visit Diagnosis: Unsteadiness on feet (R26.81);Muscle weakness (generalized) (M62.81);Repeated falls (R29.6)    Time: 1610-9604 PT Time Calculation (min) (ACUTE ONLY): 39 min   Charges:   PT Evaluation $PT Eval Moderate Complexity: 1 Mod PT Treatments $Therapeutic Activity: 8-22 mins        Van Clines, PT  Acute Rehabilitation Services Office [phone]   Levi Aland 01/27/2022, 11:43 AM

## 2022-01-27 NOTE — Progress Notes (Signed)
? ?  Inpatient Rehab Admissions Coordinator : ? ?Per therapy recommendations, patient was screened for CIR candidacy by Danne Baxter RN MSN.  At this time patient appears to be a potential candidate for CIR. I will place a rehab consult per protocol for full assessment. Please call me with any questions. Noted recent SNF in February. ? ?Danne Baxter RN MSN ?Admissions Coordinator ?[phone] ?  ?

## 2022-01-27 NOTE — Progress Notes (Signed)
Ok to reduce keppra to 500mg  PO BID due to crcl<30 ml/min per Dr. [place] Pink. ? ?Onnie Boer, PharmD, BCIDP, AAHIVP, CPP ?Infectious Disease Pharmacist ?01/27/2022 3:37 PM ? ? ?

## 2022-01-27 NOTE — Progress Notes (Signed)
? ?[facility] ?PROGRESS NOTE ? ? Tim Bradley TML:[id number] DOB: Mar 14, 1951 DOA: 01/24/2022  3 ?DOS: the patient was seen and examined on 01/27/2022 ? ?PCP: [facility] ? ?Brief History and Hospital Course:  ?71 y/o with h/o cardiomyopathy, DM, HTN, BPH, CKD, seizure disorder was noted to have a high fever at home, along with HA, chills.  Presented to the emergency department.  UA was noted to be abnormal.  He was hospitalized for further management of UTI.  Subsequently found to be bacteremic as well.  Concern for ESBL E. coli. ? ?Consultants: None ? ?Procedures: None ? ? ? ?Subjective: ?Hartness of breath is improved.  Anxiety is better.  Denies any nausea vomiting.  Still has not had any bowel movement.   ? ? ? ?Assessment/Plan: ? ?Acute cystitis ?Patient was abnormal UA.  He was febrile.  Also had tachycardia.  Patient was admitted and started on ceftriaxone.   ?Blood culture positive for E. coli in 2 out of 4 bottles with concern for ESBL.   ?Patient changed over to meropenem.  WBC is normal.  Urine culture growing E. coli and Morganella.  Waiting on final identification and sensitivities ? ?Sepsis secondary to UTI [facility]) ?E. coli bacteremia, concern for ESBL ? ?Patient had evidence for sepsis with fever, tachycardia.  WBC was noted to be normal.  Initially started on ceftriaxone.  Due to concern for ESBL E. coli he was changed over to meropenem.  Waiting on final identification and sensitivities. ? ?DVT (deep venous thrombosis) ([place]) ?There was some confusion regarding whether he is taking Eliquis or not.  Patient mentions that he is indeed taking it.  Eliquis was resumed. ? ?Dyspnea ?History of COPD ? ?Dyspnea thought to be secondary to COPD.  Chest x-ray did not show any infiltrates.  Started on nebulizer treatments and inhaled steroids with improvement in symptoms.   ? ?Essential hypertension ?Blood pressure reasonably well controlled. ? ?CKD (chronic kidney disease) stage 4, GFR 15-29 ml/min  (HCC) ?Baseline renal function seems to be between 2.5-3.5.  Creatinine noted to be higher today compared to yesterday.  Continue to monitor for now.  Avoid nephrotoxic agents.   ? ?Type 2 diabetes mellitus with hyperlipidemia ([place]) ?Last A1C at [facility] 12/27/21 7.1 %.  Currently on SSI.  Home medication list reviewed.  Not noted to be on any glucose lowering agents prior to admission.  CBGs are reasonably well controlled. ? ?BPH (benign prostatic hyperplasia) ?May continue with tamsulosin. ? ?Seizure disorder ([place]) ?Apparently on Keppra and Depakote prior to admission but he may not have been taking these medications as per H&P.   ?Some interaction between meropenem and Depakote noted which may decrease the efficacy of Depakote.  Stop Depakote for now and continue just with Keppra since we do not know of recent compliance.  Depakote level noted to be subtherapeutic. ? ?Chronic systolic CHF (congestive heart failure) ([place]) ?Last ECHO availalb from 04/11/20 with EF < 20%.  ?Noted to be on torsemide prior to admission which is currently being continued.  Beta-blocker being continued.  No ACE inhibitor or ARB due to renal dysfunction. ?Echocardiogram was done during this admission which showed EF to be 35 to 40%.  Global hypokinesis was noted.  Moderate LVH was noted. ? ? ?Physical deconditioning ?Patient apparently quite deconditioned.  For the last 8 months he has not been able to get up and walk.  Discussed with the sister.  She feels that this is because he has not received adequate  physical and Occupational Therapy.  Prior to 8 months ago he was apparently able to ambulate.  She mentions that she has been finding it very difficult to care for him.  Will involve PT and OT and TOC to assist patient and family. ?Patient mentions that he has been doing reasonably well at home.  He will talk to his sister as he is not very interested in going to any skilled nursing facility. ? ?Situational anxiety ?Low-dose Xanax. ? ?Anemia  of chronic disease ?No overt blood loss noted.  Anemia panel reveals a ferritin of 118, iron of 8, TIBC 256.  B12 266.  Folate 8.7.  Started on B12 supplements. ?Hemoglobin noted to be lower today compared to the last few days.  No overt bleeding noted.  Recheck tomorrow ? ?Constipation ?Started on bowel regimen.  Still waiting on a bowel movement.  Increase the dose of MiraLAX. ? ? ?Obesity ?Estimated body mass index is 32.2 kg/m? as calculated from the following: ?  Height as of this encounter: 6' 2.5" (1.892 m). ?  Weight as of this encounter: 115.3 kg. ? ? ?DVT Prophylaxis: Eliquis ?Code Status: DNR ?Family Communication: Discussed with patient ?Disposition Plan: To be determined.  Inpatient rehab recommended by OT. ? ?Status is: Inpatient ?Remains inpatient appropriate because: UTI, bacteremia, sepsis ? ? ? ? ?Medications: Scheduled: ? apixaban  5 mg Oral BID  ? atorvastatin  80 mg Oral Daily  ? carvedilol  6.25 mg Oral BID WC  ? hydrALAZINE  25 mg Oral TID  ? insulin aspart  0-20 Units Subcutaneous TID WC  ? isosorbide dinitrate  20 mg Oral TID  ? levETIRAcetam  750 mg Oral BID  ? mometasone-formoterol  2 puff Inhalation BID  ? polyethylene glycol  17 g Oral Daily  ? senna-docusate  2 tablet Oral BID  ? tamsulosin  0.4 mg Oral QPC supper  ? torsemide  20 mg Oral QHS  ? vitamin B-12  1,000 mcg Oral Daily  ? ?Continuous: ? meropenem (MERREM) IV 1 g (01/27/22 1019)  ? ?TML:[id number] **OR** acetaminophen, ALPRAZolam, guaiFENesin-dextromethorphan, ipratropium-albuterol, ketorolac, naphazoline-pheniramine ? ?Antibiotics: ?Anti-infectives (From admission, onward)  ? ? Start     Dose/Rate Route Frequency Ordered Stop  ? 01/25/22 1000  meropenem (MERREM) 1 g in sodium chloride 0.9 % 100 mL IVPB       ? 1 g ?200 mL/hr over 30 Minutes Intravenous Every 12 hours 01/25/22 0852    ? 01/24/22 1700  cefTRIAXone (ROCEPHIN) 1 g in sodium chloride 0.9 % 100 mL IVPB  Status:  Discontinued       ? 1 g ?200 mL/hr over 30  Minutes Intravenous Every 24 hours 01/24/22 2204 01/25/22 0851  ? 01/24/22 1630  cefTRIAXone (ROCEPHIN) 2 g in sodium chloride 0.9 % 100 mL IVPB       ? 2 g ?200 mL/hr over 30 Minutes Intravenous  Once 01/24/22 1624 01/24/22 1748  ? ?  ? ? ?Objective: ? ?Vital Signs ? ?Vitals:  ? 01/26/22 2033 01/26/22 2133 01/27/22 0509 01/27/22 1008  ?BP:  (!) 150/86 127/69 124/82  ?Pulse:  93 67 80  ?Resp:  18 18 19   ?Temp:  99.5 ?F (37.5 ?C) (!) 97.5 ?F (36.4 ?C) 98.3 ?F (36.8 ?C)  ?TempSrc:  Oral Oral   ?SpO2: 94% 96% 95% 96%  ?Weight:      ?Height:      ? ? ?Intake/Output Summary (Last 24 hours) at 01/27/2022 1059 ?Last data filed at 01/27/2022 0801 ?Johney Maine  per 24 hour  ?Intake 1080 ml  ?Output 2200 ml  ?Net -1120 ml  ? ?Filed Weights  ? 01/24/22 2329  ?Weight: 115.3 kg  ? ? ?General appearance: Awake alert.  In no distress ?Resp: Clear to auscultation bilaterally.  Normal effort ?Cardio: S1-S2 is normal regular.  No S3-S4.  No rubs murmurs or bruit ?GI: Abdomen is soft.  Nontender nondistended.  Bowel sounds are present normal.  No masses organomegaly ?Extremities: No edema.  Limited range of motion of lower extremity due to physical deconditioning ?Neurologic: Alert and oriented x3.  No focal neurological deficits.  ? ? ? ? ?Lab Results: ? ?Data Reviewed: I have personally reviewed labs and imaging study reports ? ?CBC: ?Recent Labs  ?Lab 01/24/22 ?1540 01/26/22 ?0112 01/27/22 ?6226  ?WBC 9.3 6.9 4.3  ?HGB 12.0* 10.2* 9.3*  ?HCT 37.1* 31.3* 29.0*  ?MCV 91.6 89.9 89.2  ?PLT 182 130* 160  ? ? ?Basic Metabolic Panel: ?Recent Labs  ?Lab 01/24/22 ?1621 01/25/22 ?3335 01/26/22 ?0112 01/27/22 ?4562  ?NA 133* 134* 133* 138  ?K 4.1 3.5 4.1 3.7  ?CL 102 103 105 107  ?CO2 22 20* 18* 21*  ?GLUCOSE 151* 201* 149* 120*  ?BUN 78* 77* 82* 90*  ?CREATININE 3.01* 3.14* 3.19* 3.54*  ?CALCIUM 9.2 8.7* 8.4* 8.7*  ? ? ?GFR: ?Estimated Creatinine Clearance: 25.3 mL/min (A) (by C-G formula based on SCr of 3.54 mg/dL (H)). ? ?Liver Function  Tests: ?Recent Labs  ?Lab 01/24/22 ?1621  ?AST 12*  ?ALT 12  ?ALKPHOS 49  ?BILITOT 0.5  ?PROT 7.3  ?ALBUMIN 3.4*  ? ? ? ?Coagulation Profile: ?Recent Labs  ?Lab 01/24/22 ?1540  ?INR 1.4*  ? ? ? ?CBG: ?Recent Labs  ?Lab 04

## 2022-01-27 NOTE — TOC Initial Note (Signed)
Transition of Care (TOC) - Initial/Assessment Note  ? ? ?Patient Details  ?Name: Tim Bradley ?MRN: [id number] ?Date of Birth: 08/05/1951 ? ?Transition of Care (TOC) CM/SW Contact:    ?Tom-Johnson, Renea Ee, RN ?Phone Number: ?01/27/2022, 4:23 PM ? ?Clinical Narrative:                 ? ?CM spoke with patient at bedside about needs for post hospital transition. Admitted for ESBL/UTI.  ?Patient states he is from home alone but his sister, [name] Goods stays with him most of the time and he has friends that comes around and help him. Currently on disability. Does not have any children.  ?Has a walker, manual and electric wheelchair at home.  ?Patient was active with [facility] health disciplines in 07/21. States he recently used [facility].   ?PT/OT recommending AIR. Sister, [name] requesting patient be transferred to the [facility] for further rehab at discharge. Patient is active with [facility] and a secure return call message left for April ([phone] ex [phone]). CM Awaiting call back. ?CM will continue to follow with needs.  ? ?  ?Barriers to Discharge: Continued Medical Work up ? ? ?Patient Goals and CMS Choice ?Patient states their goals for this hospitalization and ongoing recovery are:: Toreturn home after rehab ?CMS Medicare.gov Compare Post Acute Care list provided to:: Patient ?Choice offered to / list presented to : Patient ? ?Expected Discharge Plan and Services ?  ?  ?Discharge Planning Services: CM Consult ?  ?Living arrangements for the past 2 months: Apartment ?                ?  ?  ?  ?  ?  ?  ?  ?  ?  ?  ? ?Prior Living Arrangements/Services ?Living arrangements for the past 2 months: Apartment ?Lives with:: Self ?Patient language and need for interpreter reviewed:: Yes ?Do you feel safe going back to the place where you live?: Yes      ?Need for Family Participation in Patient Care: Yes (Comment) ?Care giver support system in place?: Yes (comment) ?Current home services: DME Gilford Rile,  manual and electric wheelchair.) ?Criminal Activity/Legal Involvement Pertinent to Current Situation/Hospitalization: No - Comment as needed ? ?Activities of Daily Living ?Home Assistive Devices/Equipment: Bedside commode/3-in-1, Electric scooter, Dentures (specify type), [facility] bed, Transfer belt, Wheelchair (Upper and lower dentures) ?ADL Screening (condition at time of admission) ?Patient's cognitive ability adequate to safely complete daily activities?: Yes ?Is the patient deaf or have difficulty hearing?: Yes (Right side ear) ?Does the patient have difficulty seeing, even when wearing glasses/contacts?: No ?Does the patient have difficulty concentrating, remembering, or making decisions?: No ?Patient able to express need for assistance with ADLs?: Yes ?Does the patient have difficulty dressing or bathing?: Yes ?Independently performs ADLs?: No ?Communication: Independent ?Dressing (OT): Needs assistance ?Is this a change from baseline?: Pre-admission baseline ?Grooming: Needs assistance ?Is this a change from baseline?: Pre-admission baseline ?Feeding: Independent ?Bathing: Needs assistance ?Is this a change from baseline?: Pre-admission baseline ?Toileting: Needs assistance ?Is this a change from baseline?: Pre-admission baseline ?In/Out Bed: Needs assistance ?Is this a change from baseline?: Pre-admission baseline ?Walks in Home: Needs assistance ?Is this a change from baseline?: Pre-admission baseline ?Does the patient have difficulty walking or climbing stairs?: Yes ?Weakness of Legs: Both ?Weakness of Arms/Hands: None ? ?Permission Sought/Granted ?Permission sought to share information with : Case Manager, Customer service manager, Family Supports ?Permission granted to share information with : Yes,  Verbal Permission Granted ?   ?   ?   ?   ? ?Emotional Assessment ?Appearance:: Appears stated age ?Attitude/Demeanor/Rapport: Engaged, Gracious ?Affect (typically observed): Accepting,  Appropriate, Calm, Hopeful ?Orientation: : Oriented to Self, Oriented to Place, Oriented to  Time, Oriented to Situation ?Alcohol / Substance Use: Not Applicable ?Psych Involvement: No (comment) ? ?Admission diagnosis:  Cystitis, acute [N30.00] ?Acute cystitis without hematuria [N30.00] ?Patient Active Problem List  ? Diagnosis Date Noted  ? Situational anxiety 01/26/2022  ? Physical deconditioning 01/26/2022  ? CKD (chronic kidney disease) stage 4, GFR 15-29 ml/min (HCC) 01/25/2022  ? Anemia of chronic disease 01/25/2022  ? Sepsis secondary to UTI ([place]) 01/24/2022  ? Acute cystitis 01/24/2022  ? Class 1 obesity 12/22/2021  ? Acute on chronic systolic heart failure ([place]) 12/21/2021  ? DNR (do not resuscitate) 12/21/2021  ? DVT (deep venous thrombosis) ([place]) 12/21/2021  ? Substance induced mood disorder ([place]) 10/27/2020  ? Right leg DVT ([place]) 09/30/2020  ? Seizure disorder ([place]) 09/29/2020  ? Chronic systolic CHF (congestive heart failure) ([place]) 09/28/2020  ? AKI (acute kidney injury) ([place]) 09/28/2020  ? Pressure injury of skin 05/03/2020  ? Acute encephalopathy 05/02/2020  ? Diarrhea 05/02/2020  ? Acute combined systolic and diastolic congestive heart failure ([place]) 04/22/2020  ? Essential hypertension 04/11/2020  ? Leg edema 04/11/2020  ? Dyspnea 04/11/2020  ? Acute on chronic congestive heart failure ([place]) 04/11/2020  ? BPH (benign prostatic hyperplasia) 04/11/2020  ? Acute kidney injury superimposed on chronic kidney disease ([place]) 04/11/2020  ? Generalized weakness 04/11/2020  ? Seizure ([place]) 07/21/2019  ? Cocaine abuse ([place])   ? Constipation 08/22/2018  ? Gastritis 08/20/2018  ? Heme positive stool   ? Acute blood loss anemia secondary to massive gastric ulcer   ? Hyponatremia   ? Abscess, gluteal, left 03/26/2017  ? Type 2 diabetes mellitus with hyperlipidemia ([place]) 03/26/2017  ? Adrenal nodule ([place]) 03/26/2017  ? Closed fracture of body of thoracic vertebra ([place]) 03/26/2017  ? Spinal stenosis of lumbar region  03/26/2017  ? ?PCP:  [place]:   ?[id number] - [place], [place] - [address] ?[place] ?[place] [address] ?Phone: [phone] Fax: [phone] ? ?[place], [place] - [address] ?[address] ?[place] [address] ?Phone: [phone] Fax: [phone] ? ? ? ? ?Social Determinants of Health (SDOH) Interventions ?  ? ?Readmission Risk Interventions ? ?  10/01/2020  ? 12:53 PM 07/24/2019  ? 11:09 AM  ?Readmission Risk Prevention Plan  ?Transportation Screening Complete Complete  ?PCP or Specialist Appt within 3-5 Days  Complete  ?[place] or Home Care Consult  Complete  ?Social Work Consult for [place] Planning/Counseling  Complete  ?Palliative Care Screening  Not Applicable  ?Medication Review Press photographer) Complete Complete  ?PCP or Specialist appointment within 3-5 days of discharge Complete   ?[place] or Home Care Consult Complete   ?SW Recovery Care/Counseling Consult Complete   ?Palliative Care Screening Complete   ?[place] Patient Refused   ? ? ? ?

## 2022-01-27 NOTE — Progress Notes (Signed)
Inpatient Rehab Admissions: ? ?Inpatient Rehab Consult received.  I met with patient at the bedside for rehabilitation assessment and to discuss goals and expectations of an inpatient rehab admission.  Pt acknowledged understanding of CIR goals and expectations. Pt interested in pursuing CIR. Pt gave permission to contact sister. Spoke with [place] on the telephone. She also acknowledged understanding of CIR goals and expectations. She requested that [facility] contact her regarding the possibility of pt receiving therapy at the [place]. TOC made aware. Will continue to follow. ? ?Signed: ?Gayland Curry, MS, CCC-SLP ?Admissions Coordinator ?[phone] ? ? ?

## 2022-01-27 NOTE — Evaluation (Signed)
Occupational Therapy Evaluation ?Patient Details ?Name: Tim Bradley ?MRN: [id number] ?DOB: 24-Jan-1951 ?Today's Date: 01/27/2022 ? ? ?History of Present Illness 71 yo male admitted with high fever , HA, and chills. UT (+) Pt with sepsis secondary to UTI e coli bacteria Pt using w/c at baseline with previous SNF admission PMH DM HTN BPH CKD seizure disorder COPD anixety DVT on eliquis  ? ?Clinical Impression ?  ?PT admitted with UTI (+) sepsis. Pt currently with functional limitiations due to the deficits listed below (see OT problem list). Pt currently at home with paid out of pocket aide (A) in addition to sister. Pt demonstrates good return demo to sequencing and able to power up . Pt reports improvements with prior therapy. Pt could benefit from increased therapy at D/c to return home at more functional level.  Pt will benefit from skilled OT to increase their independence and safety with adls and balance to allow discharge CIR. ?  ?   ? ?Recommendations for follow up therapy are one component of a multi-disciplinary discharge planning process, led by the attending physician.  Recommendations may be updated based on patient status, additional functional criteria and insurance authorization.  ? ?Follow Up Recommendations ? Acute inpatient rehab (3hours/day)  ?  ?Assistance Recommended at Discharge Intermittent Supervision/Assistance  ?Patient can return home with the following Two people to help with walking and/or transfers;A lot of help with bathing/dressing/bathroom;Assist for transportation ? ?  ?Functional Status Assessment ? Patient has had a recent decline in their functional status and demonstrates the ability to make significant improvements in function in a reasonable and predictable amount of time.  ?Equipment Recommendations ? Other (comment) (tba)  ?  ?Recommendations for Other Services Rehab consult ? ? ?  ?Precautions / Restrictions Precautions ?Precautions: Fall  ? ?  ? ?Mobility Bed Mobility ?Overal  bed mobility: Needs Assistance ?Bed Mobility: Rolling, Supine to Sit ?Rolling: Min guard ?  ?Supine to sit: Mod assist, HOB elevated ?  ?  ?General bed mobility comments: pt requires step by step sequence to power up from bed surface. pt unable to push with L UE off bed surface without (A) . pt with a posterior pelvic tilt ?  ? ?Transfers ?Overall transfer level: Needs assistance ?Equipment used: 2 person hand held assist ?Transfers: Sit to/from Stand ?Sit to Stand: +2 physical assistance, Mod assist, From elevated surface ?  ?  ?  ?  ?  ?General transfer comment: using stedy pt able to power from from surface. pt needs (A) to progress feet to floor and hips to eob ?  ? ?  ?Balance Overall balance assessment: Needs assistance ?Sitting-balance support: Bilateral upper extremity supported, Feet supported ?Sitting balance-Leahy Scale: Fair ?  ?  ?Standing balance support: Bilateral upper extremity supported, During functional activity, Reliant on assistive device for balance ?Standing balance-Leahy Scale: Poor ?Standing balance comment: posterior bias ?  ?  ?  ?  ?  ?  ?  ?  ?  ?  ?  ?   ? ?ADL either performed or assessed with clinical judgement  ? ?ADL Overall ADL's : Needs assistance/impaired ?Eating/Feeding: Set up;Sitting ?  ?Grooming: Wash/dry hands;Wash/dry face;Oral care;Sitting;Set up ?Grooming Details (indicate cue type and reason): sitting in steady at sink ?Upper Body Bathing: Min guard;Sitting ?  ?Lower Body Bathing: Maximal assistance;Sit to/from stand ?  ?Upper Body Dressing : Min guard;Sitting ?  ?Lower Body Dressing: Maximal assistance;Sit to/from stand ?  ?Toilet Transfer: Moderate assistance;BSC/3in1 (stedy) ?  ?  ?  Toileting - Clothing Manipulation Details (indicate cue type and reason): pt reports not voiding bowels for 1 week ?  ?  ?  ?General ADL Comments: pt requires (A) to power up from [facility] and bed. pt with increased ability to power up from [facility] as pt with better positioning toward the edge of  chair  ? ? ? ?Vision Patient Visual Report: No change from baseline ?   ?   ?Perception   ?  ?Praxis   ?  ? ?Pertinent Vitals/Pain Pain Assessment ?Pain Assessment: No/denies pain  ? ? ? ?Hand Dominance Left ?  ?Extremity/Trunk Assessment Upper Extremity Assessment ?Upper Extremity Assessment: Generalized weakness ?  ?Lower Extremity Assessment ?Lower Extremity Assessment: Generalized weakness ?  ?Cervical / Trunk Assessment ?Cervical / Trunk Assessment: Kyphotic ?  ?Communication Communication ?Communication: No difficulties ?  ?Cognition Arousal/Alertness: Awake/alert ?Behavior During Therapy: [facility] for tasks assessed/performed ?Overall Cognitive Status: Within Functional Limits for tasks assessed ?  ?  ?  ?  ?  ?  ?  ?  ?  ?  ?  ?  ?  ?  ?  ?  ?  ?  ?  ?General Comments    ? ?  ?Exercises   ?  ?Shoulder Instructions    ? ? ?Home Living Family/patient expects to be discharged to:: Private residence ?Living Arrangements: Other relatives ?Available Help at Discharge: Family;Personal care attendant ?Type of Home: Apartment ?Home Access: Level entry ?  ?  ?Home Layout: One level ?  ?  ?Bathroom Shower/Tub: Walk-in shower ?  ?Bathroom Toilet: Standard ?  ?  ?Home Equipment: Conservation officer, nature (2 wheels);BSC/3in1;Wheelchair - manual;Wheelchair - power;Hospital bed;Other (comment) (has hoyer at home but not using) ?  ?Additional Comments: Pt is interested in seeing if he can get a consistent personal care attentdant through insurance ?  ? ?  ?Prior Functioning/Environment Prior Level of Function : Needs assist ?  ?  ?  ?  ?  ?  ?Mobility Comments: Pt states that recently he has been able to lateral scoot to his power chair independently "more or less"; Sister and PCAs have assisted with trnsfers, seems like not 100% of the time; Pt indicated he does not have a sliding board ?ADLs Comments: Reports sister assists with ADL tasks. Reports he has been performing bathing at bed level. ?  ? ?  ?  ?OT Problem List: Decreased  strength;Decreased activity tolerance;Impaired balance (sitting and/or standing);Decreased safety awareness;Decreased knowledge of use of DME or AE;Decreased knowledge of precautions;Cardiopulmonary status limiting activity;Obesity ?  ?   ?OT Treatment/Interventions: Self-care/ADL training;Therapeutic exercise;Energy conservation;DME and/or AE instruction;Therapeutic activities;Patient/family education;Balance training  ?  ?OT Goals(Current goals can be found in the care plan section) Acute Rehab OT Goals ?Patient Stated Goal: to get more therapy so i can walk ?OT Goal Formulation: With patient ?Time For Goal Achievement: 02/10/22 ?Potential to Achieve Goals: Good  ?OT Frequency: Min 2X/week ?  ? ?Co-evaluation   ?  ?  ?  ?  ? ?  ?AM-PAC OT "6 Clicks" Daily Activity     ?Outcome Measure Help from another person eating meals?: None ?Help from another person taking care of personal grooming?: None ?Help from another person toileting, which includes using toliet, bedpan, or urinal?: A Lot ?Help from another person bathing (including washing, rinsing, drying)?: A Lot ?Help from another person to put on and taking off regular upper body clothing?: A Little ?Help from another person to put on and taking off regular lower body  clothing?: A Lot ?6 Click Score: 17 ?  ?End of Session Equipment Utilized During Treatment: Gait belt ?Nurse Communication: Mobility status;Precautions ? ?Activity Tolerance: Patient tolerated treatment well ?Patient left: in chair;with call bell/phone within reach;with bed alarm set ? ?OT Visit Diagnosis: Unsteadiness on feet (R26.81);Muscle weakness (generalized) (M62.81)  ?              ?Time: 2992-4268 ?OT Time Calculation (min): 30 min ?Charges:  OT General Charges ?$OT Visit: 1 Visit ?OT Evaluation ?$OT Eval Moderate Complexity: 1 Mod ?OT Treatments ?$Self Care/Home Management : 8-22 mins ? ? ?Brynn, OTR/L  ?Acute Rehabilitation Services ?Office: [phone] ?. ? ? ?Jeri Modena ?01/27/2022,  10:42 AM ?

## 2022-01-28 ENCOUNTER — Inpatient Hospital Stay (HOSPITAL_COMMUNITY): Payer: No Typology Code available for payment source

## 2022-01-28 DIAGNOSIS — N184 Chronic kidney disease, stage 4 (severe): Secondary | ICD-10-CM | POA: Diagnosis not present

## 2022-01-28 DIAGNOSIS — D638 Anemia in other chronic diseases classified elsewhere: Secondary | ICD-10-CM | POA: Diagnosis not present

## 2022-01-28 DIAGNOSIS — I5022 Chronic systolic (congestive) heart failure: Secondary | ICD-10-CM | POA: Diagnosis not present

## 2022-01-28 DIAGNOSIS — N3000 Acute cystitis without hematuria: Secondary | ICD-10-CM | POA: Diagnosis not present

## 2022-01-28 LAB — URINE CULTURE: Culture: >=100000 — AB

## 2022-01-28 LAB — GLUCOSE, CAPILLARY
Glucose-Capillary: 128 mg/dL — ABNORMAL HIGH (ref 70–99)
Glucose-Capillary: 116 mg/dL — ABNORMAL HIGH (ref 70–99)
Glucose-Capillary: 151 mg/dL — ABNORMAL HIGH (ref 70–99)
Glucose-Capillary: 119 mg/dL — ABNORMAL HIGH (ref 70–99)

## 2022-01-28 LAB — CBC
HCT: 28.6 % — ABNORMAL LOW (ref 39.0–52.0)
Hemoglobin: 9.4 g/dL — ABNORMAL LOW (ref 13.0–17.0)
MCH: 29.3 pg (ref 26.0–34.0)
MCHC: 32.9 g/dL (ref 30.0–36.0)
MCV: 89.1 fL (ref 80.0–100.0)
Platelets: 185 10*3/uL (ref 150–400)
RBC: 3.21 MIL/uL — ABNORMAL LOW (ref 4.22–5.81)
RDW: 13.8 % (ref 11.5–15.5)
WBC: 4.7 10*3/uL (ref 4.0–10.5)
nRBC: 0 % (ref 0.0–0.2)

## 2022-01-28 LAB — BASIC METABOLIC PANEL
Anion gap: 8 (ref 5–15)
BUN: 91 mg/dL — ABNORMAL HIGH (ref 8–23)
CO2: 20 mmol/L — ABNORMAL LOW (ref 22–32)
Calcium: 8.7 mg/dL — ABNORMAL LOW (ref 8.9–10.3)
Chloride: 105 mmol/L (ref 98–111)
Creatinine, Ser: 3.43 mg/dL — ABNORMAL HIGH (ref 0.61–1.24)
GFR, Estimated: 18 mL/min — ABNORMAL LOW (ref >60)
Glucose, Bld: 118 mg/dL — ABNORMAL HIGH (ref 70–99)
Potassium: 4 mmol/L (ref 3.5–5.1)
Sodium: 133 mmol/L — ABNORMAL LOW (ref 135–145)

## 2022-01-28 MED ORDER — ALPRAZOLAM 0.5 MG PO TABS
0.5000 mg | ORAL_TABLET | Freq: Two times a day (BID) | ORAL | Status: DC | PRN
  Administered 2022-01-28: 0.5 mg via ORAL
  Filled 2022-01-28: qty 1

## 2022-01-28 MED ORDER — BISACODYL 10 MG RE SUPP
10.0000 mg | Freq: Once | RECTAL | Status: AC
Start: 2022-01-28 — End: 2022-01-28
  Administered 2022-01-28: 10 mg via RECTAL
  Filled 2022-01-28: qty 1

## 2022-01-28 NOTE — Progress Notes (Signed)
? ?[facility] ?PROGRESS NOTE ? ? Tim Bradley IRW:[id number] DOB: 06-09-1951 DOA: 01/24/2022  4 ?DOS: the patient was seen and examined on 01/28/2022 ? ?PCP: [facility] ? ?Brief History and Hospital Course:  ?71 y/o with h/o cardiomyopathy, DM, HTN, BPH, CKD, seizure disorder was noted to have a high fever at home, along with HA, chills.  Presented to the emergency department.  UA was noted to be abnormal.  He was hospitalized for further management of UTI.  Subsequently found to be bacteremic as well.  Noted to have ESBL E. coli. ? ?Consultants: None ? ?Procedures: None ? ? ? ?Subjective: ?Continues to have anxiety.  No other complaints offered.  Still no bowel movement. ? ? ? ?Assessment/Plan: ? ?Acute cystitis ?Patient was abnormal UA.  He was febrile.  Also had tachycardia.  Patient was admitted and started on ceftriaxone.   ?Blood culture positive for E. coli in 2 out of 4 bottles with concern for ESBL.   ?Patient changed over to meropenem on 4/29.  WBC is normal.  Urine culture growing E. coli and Morganella.  Waiting on final identification and sensitivities. ? ?Sepsis secondary to UTI [facility]) ?E. coli bacteremia, ESBL ? ?Patient had evidence for sepsis with fever, tachycardia.  WBC was noted to be normal.  Initially started on ceftriaxone.  Due to concern for ESBL E. coli he was changed over to meropenem.  Blood culture does show ESBL E. coli.  We will consult ID to assist with antibiotic management. ? ?DVT (deep venous thrombosis) ([place]) ?There was some confusion regarding whether he is taking Eliquis or not.  Patient mentions that he is indeed taking it.  Eliquis was resumed. ? ?Dyspnea ?History of COPD ? ?Dyspnea thought to be secondary to COPD.  Chest x-ray did not show any infiltrates.  Started on nebulizer treatments and inhaled steroids with improvement in symptoms.   ? ?Essential hypertension ?Blood pressure reasonably well controlled. ? ?CKD (chronic kidney disease) stage 4, GFR 15-29  ml/min (HCC) ?Baseline renal function seems to be between 2.5-3.5.  Creatinine stable for the most part.  Continue to monitor. ? ?Type 2 diabetes mellitus with hyperlipidemia ([place]) ?Last A1C at [facility] 12/27/21 7.1 %.  Currently on SSI.  Home medication list reviewed.  Not noted to be on any glucose lowering agents prior to admission.  CBGs are reasonably well controlled. ? ?BPH (benign prostatic hyperplasia) ?May continue with tamsulosin. ? ?Seizure disorder ([place]) ?Apparently on Keppra and Depakote prior to admission but he may not have been taking these medications as per H&P.   ?Some interaction between meropenem and Depakote noted which may decrease the efficacy of Depakote.   ?Depakote was stopped.  Keppra was continued.   ? ?Chronic systolic CHF (congestive heart failure) ([place]) ?Last ECHO availalb from 04/11/20 with EF < 20%.  ?Noted to be on torsemide prior to admission which is currently being continued.  Beta-blocker being continued.  No ACE inhibitor or ARB due to renal dysfunction. ?Echocardiogram was done during this admission which showed EF to be 35 to 40%.  Global hypokinesis was noted.  Moderate LVH was noted. ? ? ?Physical deconditioning ?Patient apparently quite deconditioned.  For the last 8 months he has not been able to get up and walk.  Discussed with the sister.  She feels that this is because he has not received adequate physical and Occupational Therapy.  Prior to 8 months ago he was apparently able to ambulate.  She mentions that she has been  finding it very difficult to care for him even though he does have caregivers occasionally.  PT and OT was consulted.  Inpatient rehabilitation was recommended.  Patient is now agreeable. ? ?Situational anxiety ?Low-dose Xanax. ? ?Anemia of chronic disease ?No overt blood loss noted.  Anemia panel reveals a ferritin of 118, iron of 8, TIBC 256.  B12 266.  Folate 8.7.  Started on B12 supplements. ?Hemoglobin low but stable.  No overt bleeding  noted. ? ?Constipation ?Started on bowel regimen.  Still waiting on a bowel movement.  Dose of MiraLAX was increased yesterday.  Still no bowel movement documented.  We will give Dulcolax suppository. ? ? ?Obesity ?Estimated body mass index is 32.2 kg/m? as calculated from the following: ?  Height as of this encounter: 6' 2.5" (1.892 m). ?  Weight as of this encounter: 115.3 kg. ? ? ?DVT Prophylaxis: Eliquis ?Code Status: DNR ?Family Communication: Discussed with patient ?Disposition Plan: To be determined.  Inpatient rehab recommended by PT/OT.  TOC is following.  Rehab at [facility] to be pursued. ? ?Status is: Inpatient ?Remains inpatient appropriate because: UTI, bacteremia, sepsis ? ? ? ? ?Medications: Scheduled: ? apixaban  5 mg Oral BID  ? atorvastatin  80 mg Oral Daily  ? carvedilol  6.25 mg Oral BID WC  ? hydrALAZINE  25 mg Oral TID  ? insulin aspart  0-20 Units Subcutaneous TID WC  ? isosorbide dinitrate  20 mg Oral TID  ? levETIRAcetam  500 mg Oral BID  ? mometasone-formoterol  2 puff Inhalation BID  ? polyethylene glycol  17 g Oral BID  ? senna-docusate  2 tablet Oral BID  ? tamsulosin  0.4 mg Oral QPC supper  ? torsemide  20 mg Oral QHS  ? vitamin B-12  1,000 mcg Oral Daily  ? ?Continuous: ? meropenem (MERREM) IV Stopped (01/28/22 0019)  ? ?GYI:[id number] **OR** acetaminophen, ALPRAZolam, guaiFENesin-dextromethorphan, ipratropium-albuterol, ketorolac, naphazoline-pheniramine ? ?Antibiotics: ?Anti-infectives (From admission, onward)  ? ? Start     Dose/Rate Route Frequency Ordered Stop  ? 01/25/22 1000  meropenem (MERREM) 1 g in sodium chloride 0.9 % 100 mL IVPB       ? 1 g ?200 mL/hr over 30 Minutes Intravenous Every 12 hours 01/25/22 0852    ? 01/24/22 1700  cefTRIAXone (ROCEPHIN) 1 g in sodium chloride 0.9 % 100 mL IVPB  Status:  Discontinued       ? 1 g ?200 mL/hr over 30 Minutes Intravenous Every 24 hours 01/24/22 2204 01/25/22 0851  ? 01/24/22 1630  cefTRIAXone (ROCEPHIN) 2 g in sodium chloride 0.9 %  100 mL IVPB       ? 2 g ?200 mL/hr over 30 Minutes Intravenous  Once 01/24/22 1624 01/24/22 1748  ? ?  ? ? ?Objective: ? ?Vital Signs ? ?Vitals:  ? 01/27/22 2050 01/27/22 2108 01/28/22 9381 01/28/22 8299  ?BP:  129/76 (!) 146/73 138/89  ?Pulse:  68 64 63  ?Resp:  18 19 (!) 22  ?Temp:  97.8 ?F (36.6 ?C) 97.9 ?F (36.6 ?C) (!) 97.4 ?F (36.3 ?C)  ?TempSrc:  Oral Oral Oral  ?SpO2: 92% 94% 96% 95%  ?Weight:      ?Height:      ? ? ?Intake/Output Summary (Last 24 hours) at 01/28/2022 1018 ?Last data filed at 01/28/2022 0900 ?Gross per 24 hour  ?Intake 1209.18 ml  ?Output 1675 ml  ?Net -465.82 ml  ? ? ?Filed Weights  ? 01/24/22 2329  ?Weight: 115.3 kg  ? ? ?  General appearance: Awake alert.  In no distress ?Resp: Good air entry bilaterally.  No wheezing.  No crackles. ?Cardio: S1-S2 is normal regular.  No S3-S4.  No rubs murmurs or bruit ?GI: Abdomen is soft.  Nontender nondistended.  Bowel sounds are present normal.  No masses organomegaly ?Extremities: No edema.  Limited range of motion of lower extremities due to physical deconditioning ?Neurologic: Alert and oriented x3.  No focal neurological deficits.  ? ? ? ?Lab Results: ? ?Data Reviewed: I have personally reviewed labs and imaging study reports ? ?CBC: ?Recent Labs  ?Lab 01/24/22 ?1540 01/26/22 ?0112 01/27/22 ?2671 01/28/22 ?0411  ?WBC 9.3 6.9 4.3 4.7  ?HGB 12.0* 10.2* 9.3* 9.4*  ?HCT 37.1* 31.3* 29.0* 28.6*  ?MCV 91.6 89.9 89.2 89.1  ?PLT 182 130* 160 185  ? ? ? ?Basic Metabolic Panel: ?Recent Labs  ?Lab 01/24/22 ?1621 01/25/22 ?2458 01/26/22 ?0112 01/27/22 ?0998 01/28/22 ?0411  ?NA 133* 134* 133* 138 133*  ?K 4.1 3.5 4.1 3.7 4.0  ?CL 102 103 105 107 105  ?CO2 22 20* 18* 21* 20*  ?GLUCOSE 151* 201* 149* 120* 118*  ?BUN 78* 77* 82* 90* 91*  ?CREATININE 3.01* 3.14* 3.19* 3.54* 3.43*  ?CALCIUM 9.2 8.7* 8.4* 8.7* 8.7*  ? ? ? ?GFR: ?Estimated Creatinine Clearance: 26.1 mL/min (A) (by C-G formula based on SCr of 3.43 mg/dL (H)). ? ?Liver Function Tests: ?Recent Labs  ?Lab  01/24/22 ?1621  ?AST 12*  ?ALT 12  ?ALKPHOS 49  ?BILITOT 0.5  ?PROT 7.3  ?ALBUMIN 3.4*  ? ? ? ? ?Coagulation Profile: ?Recent Labs  ?Lab 01/24/22 ?1540  ?INR 1.4*  ? ? ? ? ?CBG: ?Recent Labs  ?Lab 01/27/22 ?3382 01/27/22 ?1136

## 2022-01-28 NOTE — Progress Notes (Signed)
Inpatient Rehab Admissions Coordinator:  ? ?Per TOC, Family prefers SNF. CIR will sign off.  ? ?Clemens Catholic, MS, CCC-SLP ?Rehab Admissions Coordinator  ?[phone] (celll) ?[phone] (office) ? ?

## 2022-01-28 NOTE — Progress Notes (Signed)
Inpatient Rehab Admissions Coordinator:  ?  ? TOC working on potential SNF through the [place], will clarify if Pt. And family want SNF or CIR. I will follow up once clarified.  ?  ?Clemens Catholic, MS, CCC-SLP ?Rehab Admissions Coordinator  ?[phone] (celll) ?[phone] (office) ?   ?  ?  ?  ? ?

## 2022-01-28 NOTE — [place] FL2 (Signed)
?[place] MEDICAID FL2 LEVEL OF CARE SCREENING TOOL  ?  ? ?IDENTIFICATION  ?Patient Name: ?Tim Bradley Birthdate: Nov 10, 1950 Sex: male Admission Date (Current Location): ?01/24/2022  ?[place] and [place] Number: ? [place] ?  Facility and Address:  ?[facility], [place] [address] [place], [place] [address] ?     Provider Number: ?[id number]  ?Attending Physician Name and Address:  ?Bonnielee Haff, MD ? Relative Name and Phone Number:  ?[name], [phone] ?   ?Current Level of Care: ?Hospital Recommended Level of Care: ?[place] Prior Approval Number: ?  ? ?Date Approved/Denied: ?  PASRR Number: ?[id number] A ? ?Discharge Plan: ?SNF ?  ? ?Current Diagnoses: ?Patient Active Problem List  ? Diagnosis Date Noted  ? Situational anxiety 01/26/2022  ? Physical deconditioning 01/26/2022  ? CKD (chronic kidney disease) stage 4, GFR 15-29 ml/min (HCC) 01/25/2022  ? Anemia of chronic disease 01/25/2022  ? Sepsis secondary to UTI ([place]) 01/24/2022  ? Acute cystitis 01/24/2022  ? Class 1 obesity 12/22/2021  ? Acute on chronic systolic heart failure ([place]) 12/21/2021  ? DNR (do not resuscitate) 12/21/2021  ? DVT (deep venous thrombosis) ([place]) 12/21/2021  ? Substance induced mood disorder ([place]) 10/27/2020  ? Right leg DVT ([place]) 09/30/2020  ? Seizure disorder ([place]) 09/29/2020  ? Chronic systolic CHF (congestive heart failure) ([place]) 09/28/2020  ? AKI (acute kidney injury) ([place]) 09/28/2020  ? Pressure injury of skin 05/03/2020  ? Acute encephalopathy 05/02/2020  ? Diarrhea 05/02/2020  ? Acute combined systolic and diastolic congestive heart failure ([place]) 04/22/2020  ? Essential hypertension 04/11/2020  ? Leg edema 04/11/2020  ? Dyspnea 04/11/2020  ? Acute on chronic congestive heart failure ([place]) 04/11/2020  ? BPH (benign prostatic hyperplasia) 04/11/2020  ? Acute kidney injury superimposed on chronic kidney disease ([place]) 04/11/2020  ? Generalized weakness 04/11/2020  ? Seizure ([place])  07/21/2019  ? Cocaine abuse ([place])   ? Constipation 08/22/2018  ? Gastritis 08/20/2018  ? Heme positive stool   ? Acute blood loss anemia secondary to massive gastric ulcer   ? Hyponatremia   ? Abscess, gluteal, left 03/26/2017  ? Type 2 diabetes mellitus with hyperlipidemia ([place]) 03/26/2017  ? Adrenal nodule ([place]) 03/26/2017  ? Closed fracture of body of thoracic vertebra ([place]) 03/26/2017  ? Spinal stenosis of lumbar region 03/26/2017  ? ? ?Orientation RESPIRATION BLADDER Height & Weight   ?  ?Self, Time, Situation, Place ? Normal Incontinent, External catheter Weight: 254 lb 3.1 oz (115.3 kg) ?Height:  6' 2.5" (189.2 cm)  ?BEHAVIORAL SYMPTOMS/MOOD NEUROLOGICAL BOWEL NUTRITION STATUS  ?    Continent Diet (See DC summary)  ?AMBULATORY STATUS COMMUNICATION OF NEEDS Skin   ?Extensive Assist Verbally Skin abrasions (Lower back Ecchymosis) ?  ?  ?  ?    ?     ?     ? ? ?Personal Care Assistance Level of Assistance  ?Bathing, Feeding, Dressing Bathing Assistance: Maximum assistance ?Feeding assistance: Limited assistance ?Dressing Assistance: Maximum assistance ?   ? ?Functional Limitations Info  ?Sight, Hearing, Speech Sight Info: Adequate ?Hearing Info: Impaired ?Speech Info: Adequate  ? ? ?SPECIAL CARE FACTORS FREQUENCY  ?PT (By licensed PT), OT (By licensed OT)   ?  ?PT Frequency: 5x week ?OT Frequency: 5x week ?  ?  ?  ?   ? ? ?Contractures Contractures Info: Not present  ? ? ?Additional Factors Info  ?Code Status, Allergies, Insulin Sliding Scale Code Status Info: DNR ?Allergies Info: Trazadone and Nefazodone ?  ?  Insulin Sliding Scale Info: Insulin Aspart (Novolog) 0-20 U 3x daily w/ meals ?  ?   ? ?Current Medications (01/28/2022):  This is the current hospital active medication list ?Current Facility-Administered Medications  ?Medication Dose Route Frequency Provider Last Rate Last Admin  ? acetaminophen (TYLENOL) tablet 650 mg  650 mg Oral Q6H PRN Neena Rhymes, MD   650 mg at 01/28/22 0305  ? Or  ?  acetaminophen (TYLENOL) suppository 650 mg  650 mg Rectal Q6H PRN Norins, Heinz Knuckles, MD      ? ALPRAZolam Duanne Moron) tablet 0.5 mg  0.5 mg Oral BID PRN Bonnielee Haff, MD      ? apixaban Arne Cleveland) tablet 5 mg  5 mg Oral BID Joselyn Glassman A, RPH   5 mg at 01/28/22 0818  ? atorvastatin (LIPITOR) tablet 80 mg  80 mg Oral Daily Neena Rhymes, MD   80 mg at 01/28/22 0818  ? carvedilol (COREG) tablet 6.25 mg  6.25 mg Oral BID WC Norins, Heinz Knuckles, MD   6.25 mg at 01/28/22 0818  ? guaiFENesin-dextromethorphan (ROBITUSSIN DM) 100-10 MG/5ML syrup 5 mL  5 mL Oral Q4H PRN Bonnielee Haff, MD   5 mL at 01/26/22 2145  ? hydrALAZINE (APRESOLINE) tablet 25 mg  25 mg Oral TID Neena Rhymes, MD   25 mg at 01/28/22 0818  ? insulin aspart (novoLOG) injection 0-20 Units  0-20 Units Subcutaneous TID WC Norins, Heinz Knuckles, MD   3 Units at 01/28/22 [phone]  ? ipratropium-albuterol (DUONEB) 0.5-2.5 (3) MG/3ML nebulizer solution 3 mL  3 mL Nebulization Q4H PRN Bonnielee Haff, MD   3 mL at 01/26/22 2032  ? isosorbide dinitrate (ISORDIL) tablet 20 mg  20 mg Oral TID Neena Rhymes, MD   20 mg at 01/28/22 0818  ? ketorolac (TORADOL) 15 MG/ML injection 15 mg  15 mg Intravenous Q6H PRN Neena Rhymes, MD   15 mg at 01/28/22 0734  ? levETIRAcetam (KEPPRA) tablet 500 mg  500 mg Oral BID Pham, Minh Q, RPH-CPP   500 mg at 01/28/22 0818  ? meropenem (MERREM) 1 g in sodium chloride 0.9 % 100 mL IVPB  1 g Intravenous Q12H Pierce, Dwayne A, RPH 200 mL/hr at 01/28/22 1051 1 g at 01/28/22 1051  ? mometasone-formoterol (DULERA) 200-5 MCG/ACT inhaler 2 puff  2 puff Inhalation BID Bonnielee Haff, MD   2 puff at 01/28/22 6269  ? naphazoline-pheniramine (NAPHCON-A) 0.025-0.3 % ophthalmic solution 1 drop  1 drop Both Eyes QID PRN Norins, Heinz Knuckles, MD      ? polyethylene glycol (MIRALAX / GLYCOLAX) packet 17 g  17 g Oral BID Bonnielee Haff, MD   17 g at 01/28/22 0818  ? senna-docusate (Senokot-S) tablet 2 tablet  2 tablet Oral BID Bonnielee Haff,  MD   2 tablet at 01/28/22 0818  ? tamsulosin (FLOMAX) capsule 0.4 mg  0.4 mg Oral QPC supper Neena Rhymes, MD   0.4 mg at 01/27/22 1714  ? torsemide (DEMADEX) tablet 20 mg  20 mg Oral QHS Bonnielee Haff, MD   20 mg at 01/27/22 2103  ? vitamin B-12 (CYANOCOBALAMIN) tablet 1,000 mcg  1,000 mcg Oral Daily Bonnielee Haff, MD   1,000 mcg at 01/28/22 0818  ? ? ? ?Discharge Medications: ?Please see discharge summary for a list of discharge medications. ? ?Relevant Imaging Results: ? ?Relevant Lab Results: ? ? ?Additional Information ?[id number] ? ?Coralee Pesa, LCSWA ? ? ? ? ?

## 2022-01-28 NOTE — TOC Progression Note (Signed)
Transition of Care (TOC) - Progression Note  ? ? ?Patient Details  ?Name: Tim Bradley ?MRN: [id number] ?Date of Birth: [birth year] ? ?Transition of Care (TOC) CM/SW Contact  ?Coralee Pesa, LCSWA ?Phone Number: ?01/28/2022, 2:40 PM ? ?Clinical Narrative:    ? ?CSW spoke with B[place]ina ( [id number]) and April ([id number]) at [facility]. They sent over paperwork that will be filled out and faxed over by primary CSW, so VA can determine what they will approve for rehab. Family notes they want to go to the [facility]. This will need to be faxed in by 11 tomorrow to be considered at 1. TOC will continue to follow for DC needs. ? ?  ?Barriers to Discharge: Continued Medical Work up ? ?Expected Discharge Plan and Services ?  ?  ?Discharge Planning Services: CM Consult ?  ?Living arrangements for the past 2 months: Apartment ?                ?  ?  ?  ?  ?  ?  ?  ?  ?  ?  ? ? ?Social Determinants of Health (SDOH) Interventions ?  ? ?Readmission Risk Interventions ? ?  10/01/2020  ? 12:53 PM 07/24/2019  ? 11:09 AM  ?Readmission Risk Prevention Plan  ?Transportation Screening Complete Complete  ?PCP or Specialist Appt within 3-5 Days  Complete  ?[place] or Home Care Consult  Complete  ?Social Work Consult for [place] Planning/Counseling  Complete  ?Palliative Care Screening  Not Applicable  ?Medication Review Press photographer) Complete Complete  ?PCP or Specialist appointment within 3-5 days of discharge Complete   ?[place] or Home Care Consult Complete   ?SW Recovery Care/Counseling Consult Complete   ?Palliative Care Screening Complete   ?[place] Patient Refused   ? ? ?

## 2022-01-28 NOTE — Consult Note (Addendum)
? ? ?[facility] for Infectious Diseases  ?                                                                                     ? ?Patient Identification: ?Patient Name: Tim Bradley MRN: [id number] [place] Date: 01/24/2022  3:38 PM ?Today's Date: 01/28/2022 ?Reason for consult: ESBL E coli bacteremia  ?Requesting provider: Bonnielee Haff ? ?Active Problems: ?  Type 2 diabetes mellitus with hyperlipidemia ([place]) ?  Constipation ?  Essential hypertension ?  Dyspnea ?  BPH (benign prostatic hyperplasia) ?  Chronic systolic CHF (congestive heart failure) ([place]) ?  Seizure disorder ([place]) ?  DNR (do not resuscitate) ?  DVT (deep venous thrombosis) ([place]) ?  Sepsis secondary to UTI [facility]) ?  Acute cystitis ?  CKD (chronic kidney disease) stage 4, GFR 15-29 ml/min (HCC) ?  Anemia of chronic disease ?  Situational anxiety ?  Physical deconditioning ? ? ?Antibiotics:  ?Ceftriaxone 4/28 ?Meropenem 4/29-c ? ?Lines/Hardware: ? ?Assessment ?71 year old male with a PMH of Polysubstance abuse ( smoking/alcohol/cocaine), anemia, CHF, CKD, DDD/lumbar spinal stenosis, DM, HTN, HLD, BPH, seizure, DVT on AC who presented to the ED on 4/28 with sudden onset severe headache and tachypnea/palpitations.  Also had fever and chills. Found to have  ? ?E coli bacteremia likely 2/2 ( ESBL)- TTE 12/3021 mild calcification of AV, mild thickening of AV. Low suspicion for endocarditis given presentation and GNR bacteremia ?UTI - he used condom cath at home ?BPH ?CKD ?Physical deconditioning - on wheel chair for last 8 months. PT and OT has been consulted  ? ?Recommendations  ?Continue meropenem as is  ?Fu repeat blood cultures  ?Fu Sensi for E coli and Moragnella morganii from urine cx/concordance of sensitivities of E coli from blood and urine ?US kidneys ?Following ? ?Rest of the management as per the primary team. Please call with questions or concerns.  ?Thank you for the  consult ? ?Rosiland Oz, MD ?Infectious Disease Physician ?[facility] ?[place] [address] ?[place], [place] [address] ?Phone: [phone]  Fax: [phone] ? ?__________________________________________________________________________________________________________ ?HPI and Hospital Course: ?71 year old male with a PMH of Polysubstance abuse ( smoking/alcohol/cocaine), anemia, CHF, CKD, DDD/lumbar spinal stenosis, DM, HTN, HLD, BPH, seizure, DVT on AC who presented to the ED on 4/28 with sudden onset severe headache and tachypnea/palpitations.  Also had fever and chills. Seen the ED on 4/21 for transient confusion and 4/26 for headache which resolved and was discharged from the ED. Spoke with sister over phone, he has been wheel chair dependent for at least 8 months due to inability to walk with unclear reason. Denies any chest pain , cough but mild SOB. Denies nausea, vomiting, abdominal pain and diarrhea. Denies urinary burning, increased frequency, suprapubic tenderness and back pain.  ? ?At ED, febrile with Tmax 103.1, WBC 9.3 ?Blood culture 4/28 with ESBL E. Coli ?UA 4/28 with large leukocytes, 21-50 RBCs and more than 50 WBCs ? ?ROS: ?General- Denies loss of appetite and loss of weight, Fevers and chills + ?HEENT - Denies blurry vision, neck pain, sinus pain, headache + ?Chest - Denies any chest pain, or  cough, mild SOB+ ?CVS- Denies any dizziness/lightheadedness, syncopal attacks, palpitations ?Abdomen- Denies any nausea, vomiting, abdominal pain, hematochezia and diarrhea ?Neuro - unable to walk and wheel chair dependent for last 8 months + ?Psych - Denies any changes in mood irritability or depressive symptoms ?GU- Denies any burning, dysuria, hematuria or increased frequency of urination ?Skin - denies any rashes/lesions ?MSK - denies any joint pain/swelling or restricted ROM  ? ?Past Medical History:  ?Diagnosis Date  ? Abscess 02/2017  ? LEFT GLUTEAL    ? Acid reflux   ? Alcohol abuse   ? Anemia   ? CHF (congestive heart failure) ([place])   ? CKD (chronic kidney disease) stage 4, GFR 15-29 ml/min (HCC) 04/11/2020  ? Cocaine abuse ([place])   ? DDD (degenerative disc disease), lumbar   ? Diabetes mellitus without complication ([place])   ? Dyspnea   ? Enlarged prostate   ? Hypertension   ? Seizures ([place])   ? Spinal stenosis, lumbar   ? ?Past Surgical History:  ?Procedure Laterality Date  ? BACK SURGERY    ? cervical x2  ? BIOPSY  08/21/2018  ? Procedure: BIOPSY;  Surgeon: Ronald Lobo, MD;  Location: [facility];  Service: Endoscopy;;  ? ESOPHAGOGASTRODUODENOSCOPY (EGD) WITH PROPOFOL N/A 08/21/2018  ? Procedure: ESOPHAGOGASTRODUODENOSCOPY (EGD) WITH PROPOFOL;  Surgeon: Ronald Lobo, MD;  Location: [facility];  Service: Endoscopy;  Laterality: N/A;  ? INCISION AND DRAINAGE PERIRECTAL ABSCESS N/A 03/26/2017  ? Procedure: IRRIGATION AND DEBRIDEMENT PERIRECTAL ABSCESS;  Surgeon: Rolm Bookbinder, MD;  Location: [place];  Service: General;  Laterality: N/A;  ? ? ? ?Scheduled Meds: ? apixaban  5 mg Oral BID  ? atorvastatin  80 mg Oral Daily  ? bisacodyl  10 mg Rectal Once  ? carvedilol  6.25 mg Oral BID WC  ? hydrALAZINE  25 mg Oral TID  ? insulin aspart  0-20 Units Subcutaneous TID WC  ? isosorbide dinitrate  20 mg Oral TID  ? levETIRAcetam  500 mg Oral BID  ? mometasone-formoterol  2 puff Inhalation BID  ? polyethylene glycol  17 g Oral BID  ? senna-docusate  2 tablet Oral BID  ? tamsulosin  0.4 mg Oral QPC supper  ? torsemide  20 mg Oral QHS  ? vitamin B-12  1,000 mcg Oral Daily  ? ?Continuous Infusions: ? meropenem (MERREM) IV Stopped (01/28/22 0019)  ? ?PRN Meds:.acetaminophen **OR** acetaminophen, ALPRAZolam, guaiFENesin-dextromethorphan, ipratropium-albuterol, ketorolac, naphazoline-pheniramine ? ?Allergies  ?Allergen Reactions  ? Trazodone And Nefazodone   ?  Family reports seizures from this medication  ? ?Social History  ? ?Socioeconomic History  ? Marital status:  Widowed  ?  Spouse name: Not on file  ? Number of children: 0  ? Years of education: Not on file  ? Highest education level: High school graduate  ?Occupational History  ? Occupation: retired  ?Tobacco Use  ? Smoking status: Every Day  ?  Packs/day: 0.25  ?  Types: Cigarettes  ? Smokeless tobacco: Never  ?Vaping Use  ? Vaping Use: Never used  ?Substance and Sexual Activity  ? Alcohol use: Yes  ?  Alcohol/week: 12.0 standard drinks  ?  Types: 12 Cans of beer per week  ?  Comment: i can of beer daily  ? Drug use: Yes  ?  Types: Cocaine, Marijuana  ?  Comment: 03/2020   ? Sexual activity: Not on file  ?Other Topics Concern  ? Not on file  ?Social History Narrative  ? [place] veteran  ? ?  Social Determinants of Health  ? ?Financial Resource Strain: Not on file  ?Food Insecurity: Not on file  ?Transportation Needs: Not on file  ?Physical Activity: Not on file  ?Stress: Not on file  ?Social Connections: Not on file  ?Intimate Partner Violence: Not on file  ? ? ?Breast Cancer-relatedfamily history is not on file. ? ? ?Vitals ?BP 138/89 (BP Location: Left Arm)   Pulse 63   Temp (!) 97.4 ?F (36.3 ?C) (Oral)   Resp (!) 22   Ht 6' 2.5" (1.892 m)   Wt 115.3 kg   SpO2 95%   BMI 32.20 kg/m?  ? ? ?Physical Exam ?Constitutional:  lying in the bed and overweight  ?   Comments:  ? ?Cardiovascular:  ?   Rate and Rhythm: Normal rate and regular rhythm.  ?   Heart sounds:  ? ?Pulmonary:  ?   Effort: Pulmonary effort is normal on room air ?   Comments:  ? ?Abdominal:  ?   Palpations: Abdomen is soft.  ?   Tenderness: non distended and non tender  ? ?Musculoskeletal:     ?   General: No swelling or tenderness. 2+ pitting pedal edema bilateral ( seems chronic) ? ?Skin: ?   Comments:  ? ?Neurological:  ?   General: grossly non focal, awake, alert and oriented. Wheel chair bound for 8 months due to inability to walk per sister ? ?Psychiatric:     ?   Mood and Affect: Mood normal.  ? ? ?Pertinent Microbiology ?Results for orders placed  or performed during the hospital encounter of 01/24/22  ?Blood Culture (routine x 2)     Status: Abnormal  ? Collection Time: 01/24/22  4:10 PM  ? Specimen: BLOOD  ?Result Value Ref Range Status  ? Specimen Description BLOO

## 2022-01-29 DIAGNOSIS — E1169 Type 2 diabetes mellitus with other specified complication: Secondary | ICD-10-CM

## 2022-01-29 DIAGNOSIS — B9620 Unspecified Escherichia coli [E. coli] as the cause of diseases classified elsewhere: Secondary | ICD-10-CM

## 2022-01-29 DIAGNOSIS — N400 Enlarged prostate without lower urinary tract symptoms: Secondary | ICD-10-CM | POA: Diagnosis not present

## 2022-01-29 DIAGNOSIS — N184 Chronic kidney disease, stage 4 (severe): Secondary | ICD-10-CM | POA: Diagnosis not present

## 2022-01-29 DIAGNOSIS — A419 Sepsis, unspecified organism: Secondary | ICD-10-CM

## 2022-01-29 DIAGNOSIS — D638 Anemia in other chronic diseases classified elsewhere: Secondary | ICD-10-CM | POA: Diagnosis not present

## 2022-01-29 DIAGNOSIS — K5900 Constipation, unspecified: Secondary | ICD-10-CM

## 2022-01-29 DIAGNOSIS — E785 Hyperlipidemia, unspecified: Secondary | ICD-10-CM

## 2022-01-29 DIAGNOSIS — N390 Urinary tract infection, site not specified: Secondary | ICD-10-CM

## 2022-01-29 DIAGNOSIS — R5381 Other malaise: Secondary | ICD-10-CM

## 2022-01-29 DIAGNOSIS — I5022 Chronic systolic (congestive) heart failure: Secondary | ICD-10-CM | POA: Diagnosis not present

## 2022-01-29 LAB — CBC
HCT: 31 % — ABNORMAL LOW (ref 39.0–52.0)
Hemoglobin: 9.9 g/dL — ABNORMAL LOW (ref 13.0–17.0)
MCH: 28.3 pg (ref 26.0–34.0)
MCHC: 31.9 g/dL (ref 30.0–36.0)
MCV: 88.6 fL (ref 80.0–100.0)
Platelets: 219 10*3/uL (ref 150–400)
RBC: 3.5 MIL/uL — ABNORMAL LOW (ref 4.22–5.81)
RDW: 14.1 % (ref 11.5–15.5)
WBC: 4.6 10*3/uL (ref 4.0–10.5)
nRBC: 0 % (ref 0.0–0.2)

## 2022-01-29 LAB — BASIC METABOLIC PANEL
Anion gap: 11 (ref 5–15)
BUN: 86 mg/dL — ABNORMAL HIGH (ref 8–23)
CO2: 21 mmol/L — ABNORMAL LOW (ref 22–32)
Calcium: 8.7 mg/dL — ABNORMAL LOW (ref 8.9–10.3)
Chloride: 105 mmol/L (ref 98–111)
Creatinine, Ser: 3.04 mg/dL — ABNORMAL HIGH (ref 0.61–1.24)
GFR, Estimated: 21 mL/min — ABNORMAL LOW (ref >60)
Glucose, Bld: 116 mg/dL — ABNORMAL HIGH (ref 70–99)
Potassium: 4.1 mmol/L (ref 3.5–5.1)
Sodium: 137 mmol/L (ref 135–145)

## 2022-01-29 LAB — GLUCOSE, CAPILLARY
Glucose-Capillary: 152 mg/dL — ABNORMAL HIGH (ref 70–99)
Glucose-Capillary: 137 mg/dL — ABNORMAL HIGH (ref 70–99)
Glucose-Capillary: 188 mg/dL — ABNORMAL HIGH (ref 70–99)
Glucose-Capillary: 87 mg/dL (ref 70–99)

## 2022-01-29 MED ORDER — SODIUM CHLORIDE 0.9 % IV SOLN
1.0000 g | Freq: Two times a day (BID) | INTRAVENOUS | Status: DC
  Administered 2022-01-29 – 2022-01-31 (×5): 1 g via INTRAVENOUS
  Filled 2022-01-29 (×6): qty 20

## 2022-01-29 MED ORDER — ALPRAZOLAM 0.5 MG PO TABS
0.5000 mg | ORAL_TABLET | Freq: Three times a day (TID) | ORAL | Status: DC | PRN
  Administered 2022-01-29 – 2022-01-31 (×5): 0.5 mg via ORAL
  Filled 2022-01-29 (×5): qty 1

## 2022-01-29 NOTE — Progress Notes (Signed)
ID Brief note  ? ?Remains afebrile, no leukocytosis ?Blood cx 4/29 no growth in 2+ days ? ?E coli from urine and blood cultures concordant and most likely source of bacteremia is Urinary. Urine cultures also has more than 100K Morganella morgnaii ? ?US kidneys 5/2 unremarkable ? ?Complete 7 days of meropenem total given ESBL. EOT 01/31/22 ?Will avoid PO Bactrim in light of CKD ?ID available as needed. Please call with questions ? ?Rosiland Oz, MD ?Infectious Disease Physician ?[facility] ?[place] [address] ?[place], [place] [address] ?Phone: [phone]  Fax: [phone] ? ? ?

## 2022-01-29 NOTE — TOC Progression Note (Addendum)
Transition of Care (TOC) - Initial/Assessment Note  ? ? ?Patient Details  ?Name: Tim Bradley ?MRN: [id number] ?Date of Birth: June 10, 1951 ? ?Transition of Care (TOC) CM/SW Contact:    ?Paulene Floor Lawrnce Reyez, LCSWA ?Phone Number: ?01/29/2022, 10:38 AM ? ?Clinical Narrative:                 ?10:15-  CSW faxed SNF request information to the [place] and is awaiting a response.  ? ?15:30-  VA approved 30 day SNF stay.  CSW faxed FL2 to [facility] approved agencies.   ? ?Pending bed offers. ? ?  ?Barriers to Discharge: Continued Medical Work up ? ? ?Patient Goals and CMS Choice ?Patient states their goals for this hospitalization and ongoing recovery are:: Toreturn home after rehab ?CMS Medicare.gov Compare Post Acute Care list provided to:: Patient ?Choice offered to / list presented to : Patient ? ?Expected Discharge Plan and Services ?  ?  ?Discharge Planning Services: CM Consult ?  ?Living arrangements for the past 2 months: Apartment ?                ?  ?  ?  ?  ?  ?  ?  ?  ?  ?  ? ?Prior Living Arrangements/Services ?Living arrangements for the past 2 months: Apartment ?Lives with:: Self ?Patient language and need for interpreter reviewed:: Yes ?Do you feel safe going back to the place where you live?: Yes      ?Need for Family Participation in Patient Care: Yes (Comment) ?Care giver support system in place?: Yes (comment) ?Current home services: DME [place] [facility], manual and electric wheelchair.) ?Criminal Activity/Legal Involvement Pertinent to Current Situation/Hospitalization: No - Comment as needed ? ?Activities of Daily Living ?Home Assistive Devices/Equipment: Bedside commode/3-in-1, Electric scooter, Dentures (specify type), [facility] bed, Transfer belt, Wheelchair (Upper and lower dentures) ?ADL Screening (condition at time of admission) ?Patient's cognitive ability adequate to safely complete daily activities?: Yes ?Is the patient deaf or have difficulty hearing?: Yes (Right side ear) ?Does the patient have difficulty  seeing, even when wearing glasses/contacts?: No ?Does the patient have difficulty concentrating, remembering, or making decisions?: No ?Patient able to express need for assistance with ADLs?: Yes ?Does the patient have difficulty dressing or bathing?: Yes ?Independently performs ADLs?: No ?Communication: Independent ?Dressing (OT): Needs assistance ?Is this a change from baseline?: Pre-admission baseline ?Grooming: Needs assistance ?Is this a change from baseline?: Pre-admission baseline ?Feeding: Independent ?Bathing: Needs assistance ?Is this a change from baseline?: Pre-admission baseline ?Toileting: Needs assistance ?Is this a change from baseline?: Pre-admission baseline ?In/Out Bed: Needs assistance ?Is this a change from baseline?: Pre-admission baseline ?Walks in Home: Needs assistance ?Is this a change from baseline?: Pre-admission baseline ?Does the patient have difficulty walking or climbing stairs?: Yes ?Weakness of Legs: Both ?Weakness of Arms/Hands: None ? ?Permission Sought/Granted ?Permission sought to share information with : Case Manager, Customer service manager, Family Supports ?Permission granted to share information with : Yes, Verbal Permission Granted ?   ?   ?   ?   ? ?Emotional Assessment ?Appearance:: Appears stated age ?Attitude/Demeanor/Rapport: Engaged, Gracious ?Affect (typically observed): Accepting, Appropriate, Calm, Hopeful ?Orientation: : Oriented to Self, Oriented to Place, Oriented to  Time, Oriented to Situation ?Alcohol / Substance Use: Not Applicable ?Psych Involvement: No (comment) ? ?Admission diagnosis:  Cystitis, acute [N30.00] ?Acute cystitis without hematuria [N30.00] ?Patient Active Problem List  ? Diagnosis Date Noted  ? Situational anxiety 01/26/2022  ? Physical deconditioning 01/26/2022  ? CKD (  chronic kidney disease) stage 4, GFR 15-29 ml/min (HCC) 01/25/2022  ? Anemia of chronic disease 01/25/2022  ? Sepsis secondary to UTI ([place]) 01/24/2022  ? Acute  cystitis 01/24/2022  ? Class 1 obesity 12/22/2021  ? Acute on chronic systolic heart failure ([place]) 12/21/2021  ? DNR (do not resuscitate) 12/21/2021  ? DVT (deep venous thrombosis) ([place]) 12/21/2021  ? Substance induced mood disorder ([place]) 10/27/2020  ? Right leg DVT ([place]) 09/30/2020  ? Seizure disorder ([place]) 09/29/2020  ? Chronic systolic CHF (congestive heart failure) ([place]) 09/28/2020  ? AKI (acute kidney injury) ([place]) 09/28/2020  ? Pressure injury of skin 05/03/2020  ? Acute encephalopathy 05/02/2020  ? Diarrhea 05/02/2020  ? Acute combined systolic and diastolic congestive heart failure ([place]) 04/22/2020  ? Essential hypertension 04/11/2020  ? Leg edema 04/11/2020  ? Dyspnea 04/11/2020  ? Acute on chronic congestive heart failure ([place]) 04/11/2020  ? BPH (benign prostatic hyperplasia) 04/11/2020  ? Acute kidney injury superimposed on chronic kidney disease ([place]) 04/11/2020  ? Generalized weakness 04/11/2020  ? Seizure ([place]) 07/21/2019  ? Cocaine abuse ([place])   ? Constipation 08/22/2018  ? Gastritis 08/20/2018  ? Heme positive stool   ? Acute blood loss anemia secondary to massive gastric ulcer   ? Hyponatremia   ? Abscess, gluteal, left 03/26/2017  ? Type 2 diabetes mellitus with hyperlipidemia ([place]) 03/26/2017  ? Adrenal nodule ([place]) 03/26/2017  ? Closed fracture of body of thoracic vertebra ([place]) 03/26/2017  ? Spinal stenosis of lumbar region 03/26/2017  ? ?PCP:  [place]:   ?[id number] - [place], [place] - [address] ?[place] ?[place] [address] ?Phone: [phone] Fax: [phone] ? ?[place], [place] - [address] ?[address] ?[place] [address] ?Phone: [phone] Fax: [phone] ? ? ? ? ?Social Determinants of Health (SDOH) Interventions ?  ? ?Readmission Risk Interventions ? ?  10/01/2020  ? 12:53 PM 07/24/2019  ? 11:09 AM  ?Readmission Risk Prevention Plan   ?Transportation Screening Complete Complete  ?PCP or Specialist Appt within 3-5 Days  Complete  ?[place] or Home Care Consult  Complete  ?Social Work Consult for [place] Planning/Counseling  Complete  ?Palliative Care Screening  Not Applicable  ?Medication Review Press photographer) Complete Complete  ?PCP or Specialist appointment within 3-5 days of discharge Complete   ?[place] or Home Care Consult Complete   ?SW Recovery Care/Counseling Consult Complete   ?Palliative Care Screening Complete   ?[place] Patient Refused   ? ? ? ?

## 2022-01-29 NOTE — Progress Notes (Signed)
Physical Therapy Treatment ?Patient Details ?Name: Tim Bradley ?MRN: [id number] ?DOB: [birth year] ?Today's Date: 01/29/2022 ? ? ?History of Present Illness 71 yo male admitted with high fever , HA, and chills. UT (+) Pt with sepsis secondary to UTI e coli bacteria Pt using w/c at baseline with previous SNF admission PMH DM HTN BPH CKD seizure disorder COPD anixety DVT on eliquis ? ?  ?PT Comments  ? ? Continuing work on functional mobility and activity tolerance;  session focused on serial sit<>stands for strengthening and reinforcement of motor pattern beginning with anterior weight shifting; Heavy mod assist still for pushing up sidelying to sit at EOB; Mod assist for initail sit to stand from bed, then min assist for sit to stands from elevated Stedy seat; mod assist to control descent to sit to recliner; Good participation; Overall progressing well; Anticipate continuing good progress at post-acute rehabilitation. ?  ?Recommendations for follow up therapy are one component of a multi-disciplinary discharge planning process, led by the attending physician.  Recommendations may be updated based on patient status, additional functional criteria and insurance authorization. ? ?Follow Up Recommendations ? Skilled nursing-short term rehab (<3 hours/day) ?  ?  ?Assistance Recommended at Discharge Intermittent Supervision/Assistance  ?Patient can return home with the following A lot of help with walking and/or transfers ?  ?Equipment Recommendations ? Other (comment) (sliding board; drop-arm 3in1)  ?  ?Recommendations for Other Services   ? ? ?  ?Precautions / Restrictions Precautions ?Precautions: Fall  ?  ? ?Mobility ? Bed Mobility ?Overal bed mobility: Needs Assistance ?Bed Mobility: Rolling, Supine to Sit ?Rolling: Min assist ?  ?Supine to sit: Mod assist, HOB elevated ?  ?  ?General bed mobility comments: pt requires step by step sequence to power up from bed surface. pt unable to push with L UE off bed surface  without (A) . pt with a posterior pelvic tilt ?  ? ?Transfers ?Overall transfer level: Needs assistance ?Equipment used: Ambulation equipment used ?Transfers: Sit to/from Stand ?Sit to Stand: Mod assist, From elevated surface ?  ?  ?  ?  ?  ?General transfer comment: using stedy pt able to power from from surface. pt needs (A) to progress feet to floor and hips to EOB in prep for standing; shows excellent effort with pushing up; performed serial sit<> stands from stedy seat for strengthening and motor pattern work ?  ? ?Ambulation/Gait ?  ?  ?  ?  ?  ?  ?  ?  ? ? ?Stairs ?  ?  ?  ?  ?  ? ? ?Wheelchair Mobility ?  ? ?Modified Rankin (Stroke Patients Only) ?  ? ? ?  ?Balance   ?  ?Sitting balance-Leahy Scale: Fair ?  ?  ?  ?Standing balance-Leahy Scale: Poor ?Standing balance comment: posterior bias ?  ?  ?  ?  ?  ?  ?  ?  ?  ?  ?  ?  ? ?  ?Cognition Arousal/Alertness: Awake/alert ?Behavior During Therapy: [facility] for tasks assessed/performed ?Overall Cognitive Status: Within Functional Limits for tasks assessed ?  ?  ?  ?  ?  ?  ?  ?  ?  ?  ?  ?  ?  ?  ?  ?  ?  ?  ?  ? ?  ?Exercises   ? ?  ?General Comments General comments (skin integrity, edema, etc.): Seems excited to get to rehab with the [facility] ?  ?  ? ?Pertinent  Vitals/Pain Pain Assessment ?Pain Assessment: Faces ?Faces Pain Scale: Hurts little more ?Pain Location: bil knees with standing and using stedy ?Pain Descriptors / Indicators: Aching, Discomfort ?Pain Intervention(s): Limited activity within patient's tolerance  ? ? ?Home Living   ?  ?  ?  ?  ?  ?  ?  ?  ?  ?   ?  ?Prior Function    ?  ?  ?   ? ?PT Goals (current goals can now be found in the care plan section) Acute Rehab PT Goals ?Patient Stated Goal: To be independent ?PT Goal Formulation: With patient ?Time For Goal Achievement: 02/10/22 ?Potential to Achieve Goals: Good ?Progress towards PT goals: Progressing toward goals ? ?  ?Frequency ? ? ? Min 3X/week ? ? ? ?  ?PT Plan Discharge plan needs to be  updated (update to post-acute rehab at [facility])  ? ? ?Co-evaluation   ?  ?  ?  ?  ? ?  ?AM-PAC PT "6 Clicks" Mobility   ?Outcome Measure ? Help needed turning from your back to your side while in a flat bed without using bedrails?: A Little ?Help needed moving from lying on your back to sitting on the side of a flat bed without using bedrails?: A Lot ?Help needed moving to and from a bed to a chair (including a wheelchair)?: A Lot ?Help needed standing up from a chair using your arms (e.g., wheelchair or bedside chair)?: Total ?Help needed to walk in hospital room?: Total ?Help needed climbing 3-5 steps with a railing? : Total ?6 Click Score: 10 ? ?  ?End of Session Equipment Utilized During Treatment: Gait belt ?Activity Tolerance: Patient tolerated treatment well ?Patient left: in chair;with call bell/phone within reach;with chair alarm set ?Nurse Communication: Mobility status;Need for lift equipment Charlaine Dalton is helpful) ?PT Visit Diagnosis: Unsteadiness on feet (R26.81);Muscle weakness (generalized) (M62.81);Repeated falls (R29.6) ?  ? ? ?Time: 7473-4037 ?PT Time Calculation (min) (ACUTE ONLY): 30 min ? ?Charges:  $Therapeutic Activity: 23-37 mins          ?          ? ?Roney Marion, PT  ?Acute Rehabilitation Services ?Office [phone] ? ? ? ?[place]letta [place] ?01/29/2022, 5:54 PM ? ?

## 2022-01-29 NOTE — Progress Notes (Signed)
?PROGRESS NOTE ? ? ? ?Tim Bradley  RDE:[id number] DOB: 06-14-1951 DOA: 01/24/2022 ?[facility]P: [facility]  ? ?Brief Narrative:  ?71 y/o with h/o cardiomyopathy, DM type II, HTN, BPH, CKD stage IV, seizure disorder presented with headache and chills.  He was admitted for UTI and was started on IV antibiotics.  He was subsequently found to have ESBL E. coli bacteremia; antibiotics switched to meropenem.  ID was consulted. ? ?Assessment & Plan: ?  ?ESBL E. coli bacteremia ?Sepsis secondary to UTI: Present on admission ?UTI: Present on admission ?-Repeat blood cultures from 01/25/2022 have been negative so far.  Urine culture grew E. coli and Morganella morganii ?-Currently on meropenem.  ID following. ?-Sepsis has resolved. ? ?History of DVT ?-Continue Eliquis ? ?Hyponatremia ?-Resolved ? ?CKD stage IV ?-Baseline creatinine around 2.5-3.5.  Creatinine currently stable ? ?History of COPD ?-Currently stable.  Continue inhaled steroids.  Oxygen supplementation as needed ? ?Essential hypertension ?Hyperlipidemia ?-Blood pressure stable.  Continue Coreg, hydralazine, isosorbide dinitrate, torsemide and statin ? ?Diabetes mellitus type 2 with hyperglycemia ?-Last A1c at [facility] on 12/27/2021 was 7.1.  Continue CBGs with SSI.  Carb modified diet ? ?BPH ?-Continue Flomax ? ?Seizure disorder ?-Apparently on Keppra and Depakote prior to admission but he may not have been taking these medications as per H&P.   ?-Some interaction between meropenem and Depakote noted which may decrease the efficacy of Depakote.   ?-Depakote was stopped.  Keppra was continued.   ? ?Chronic systolic CHF ?-Last echo available from 04/11/2020 showed EF of less than 20%.  Echo done during this admission showed EF of 35 to 40% with global hypokinesis and moderate LVH ?-Strict input and output.  Daily weights.  Fluid restriction.  Continue Coreg, hydralazine, isosorbide dinitrate and torsemide.  Outpatient follow-up with cardiology ? ?Physical  deconditioning ?-Patient is very deconditioned ?-For the last 8 months, he has not been able to get up and walk.   ?-PT recommends SNF placement.  Social worker following. ? ?Situational anxiety ?-Continue low-dose Xanax as needed ? ?Anemia of chronic disease ?-Hemoglobin stable.  Monitor intermittently ? ?Possible B12 deficiency ?-B12 266.  Started on supplementation: Continue ? ?Constipation ?-Continue bowel regimen. ? ?Obesity ?-Outpatient follow-up ? ?DVT prophylaxis: Eliquis ?Code Status: DNR ?Family Communication: None at bedside ?Disposition Plan: ?Status is: Inpatient ?Remains inpatient appropriate because: Of severity of illness.  Need for SNF placement. ? ?Consultants: ID ? ?Procedures: Echo ? ?Antimicrobials:  ?Anti-infectives (From admission, onward)  ? ? Start     Dose/Rate Route Frequency Ordered Stop  ? 01/29/22 1000  meropenem (MERREM) 1 g in sodium chloride 0.9 % 100 mL IVPB       ? 1 g ?200 mL/hr over 30 Minutes Intravenous Every 12 hours 01/29/22 0839 01/31/22 2359  ? 01/25/22 1000  meropenem (MERREM) 1 g in sodium chloride 0.9 % 100 mL IVPB  Status:  Discontinued       ? 1 g ?200 mL/hr over 30 Minutes Intravenous Every 12 hours 01/25/22 0852 01/29/22 0839  ? 01/24/22 1700  cefTRIAXone (ROCEPHIN) 1 g in sodium chloride 0.9 % 100 mL IVPB  Status:  Discontinued       ? 1 g ?200 mL/hr over 30 Minutes Intravenous Every 24 hours 01/24/22 2204 01/25/22 0851  ? 01/24/22 1630  cefTRIAXone (ROCEPHIN) 2 g in sodium chloride 0.9 % 100 mL IVPB       ? 2 g ?200 mL/hr over 30 Minutes Intravenous  Once 01/24/22 1624 01/24/22 1748  ? ?  ? ? ? ?  Subjective: ?Patient seen and examined at bedside.  Feels slightly better.  Poor historian.  No overnight fever, vomiting, seizures reported. ? ?Objective: ?Vitals:  ? 01/28/22 2100 01/29/22 0510 01/29/22 0809 01/29/22 1055  ?BP: 123/89 130/78 126/63   ?Pulse: 73 67 81   ?Resp: 18 18 16    ?Temp: 98.9 ?F (37.2 ?C) 98.2 ?F (36.8 ?C) 98 ?F (36.7 ?C)   ?TempSrc:  Oral Oral    ?SpO2: 99% 97% 94% 94%  ?Weight: 116 kg     ?Height:      ? ? ?Intake/Output Summary (Last 24 hours) at 01/29/2022 1131 ?Last data filed at 01/29/2022 1103 ?Gross per 24 hour  ?Intake 800 ml  ?Output 2375 ml  ?Net -1575 ml  ? ?Filed Weights  ? 01/24/22 2329 01/28/22 2100  ?Weight: 115.3 kg 116 kg  ? ? ?Examination: ? ?General exam: Appears calm and comfortable.  Looks chronically ill and deconditioned.  Currently on 2 L oxygen via nasal cannula ?Respiratory system: Bilateral decreased breath sounds at bases with some scattered crackles ?Cardiovascular system: S1 & S2 heard, Rate controlled ?Gastrointestinal system: Abdomen is nondistended, soft and nontender. Normal bowel sounds heard. ?Extremities: No cyanosis, clubbing; trace lower extremity edema  ?Central nervous system: Awake, slow to respond.  Poor historian.  No focal neurological deficits. Moving extremities ?Skin: No rashes, lesions or ulcers ?Psychiatry: Flat affect.  No signs of agitation. ? ? ? ?Data Reviewed: I have personally reviewed following labs and imaging studies ? ?CBC: ?Recent Labs  ?Lab 01/24/22 ?1540 01/26/22 ?0112 01/27/22 ?3790 01/28/22 ?0411 01/29/22 ?0636  ?WBC 9.3 6.9 4.3 4.7 4.6  ?HGB 12.0* 10.2* 9.3* 9.4* 9.9*  ?HCT 37.1* 31.3* 29.0* 28.6* 31.0*  ?MCV 91.6 89.9 89.2 89.1 88.6  ?PLT 182 130* 160 185 219  ? ?Basic Metabolic Panel: ?Recent Labs  ?Lab 01/25/22 ?2409 01/26/22 ?0112 01/27/22 ?7353 01/28/22 ?2992 01/29/22 ?4268  ?NA 134* 133* 138 133* 137  ?K 3.5 4.1 3.7 4.0 4.1  ?CL 103 105 107 105 105  ?CO2 20* 18* 21* 20* 21*  ?GLUCOSE 201* 149* 120* 118* 116*  ?BUN 77* 82* 90* 91* 86*  ?CREATININE 3.14* 3.19* 3.54* 3.43* 3.04*  ?CALCIUM 8.7* 8.4* 8.7* 8.7* 8.7*  ? ?GFR: ?Estimated Creatinine Clearance: 29.5 mL/min (A) (by C-G formula based on SCr of 3.04 mg/dL (H)). ?Liver Function Tests: ?Recent Labs  ?Lab 01/24/22 ?1621  ?AST 12*  ?ALT 12  ?ALKPHOS 49  ?BILITOT 0.5  ?PROT 7.3  ?ALBUMIN 3.4*  ? ?No results for input(s): LIPASE, AMYLASE in  the last 168 hours. ?No results for input(s): AMMONIA in the last 168 hours. ?Coagulation Profile: ?Recent Labs  ?Lab 01/24/22 ?1540  ?INR 1.4*  ? ?Cardiac Enzymes: ?No results for input(s): CKTOTAL, CKMB, CKMBINDEX, TROPONINI in the last 168 hours. ?BNP (last 3 results) ?No results for input(s): PROBNP in the last 8760 hours. ?HbA1C: ?No results for input(s): HGBA1C in the last 72 hours. ?CBG: ?Recent Labs  ?Lab 01/28/22 ?1146 01/28/22 ?1611 01/28/22 ?2059 01/29/22 ?0809 01/29/22 ?1102  ?GLUCAP 116* 151* 119* 152* 137*  ? ?Lipid Profile: ?No results for input(s): CHOL, HDL, LDLCALC, TRIG, CHOLHDL, LDLDIRECT in the last 72 hours. ?Thyroid Function Tests: ?No results for input(s): TSH, T4TOTAL, FREET4, T3FREE, THYROIDAB in the last 72 hours. ?Anemia Panel: ?No results for input(s): VITAMINB12, FOLATE, FERRITIN, TIBC, IRON, RETICCTPCT in the last 72 hours. ?Sepsis Labs: ?Recent Labs  ?Lab 01/24/22 ?2035 01/25/22 ?3419 01/25/22 ?0317  ?LATICACIDVEN 1.6 2.5* 1.3  ? ? ?Recent Results (  from the past 240 hour(s))  ?Blood Culture (routine x 2)     Status: Abnormal  ? Collection Time: 01/24/22  4:10 PM  ? Specimen: BLOOD  ?Result Value Ref Range Status  ? Specimen Description BLOOD RIGHT ANTECUBITAL  Final  ? Special Requests   Final  ?  BOTTLES DRAWN AEROBIC AND ANAEROBIC Blood Culture adequate volume  ? Culture  Setup Time   Final  ?  GRAM NEGATIVE RODS ?IN BOTH AEROBIC AND ANAEROBIC BOTTLES ?CRITICAL RESULT CALLED TO, READ BACK BY AND VERIFIED WITH: PHARMD K PIERCE [id number] AT 843 AM BY CM ?Performed at [facility], [place] [address], [place], [place] [address] ?  ? Culture (A)  Final  ?  ESCHERICHIA COLI ?Confirmed Extended Spectrum Beta-Lactamase Producer (ESBL).  In bloodstream infections from ESBL organisms, carbapenems are preferred over piperacillin/tazobactam. They are shown to have a lower risk of mortality. ?  ? Report Status 01/27/2022 FINAL  Final  ? Organism ID, Bacteria ESCHERICHIA COLI  Final  ?     Susceptibility  ? Escherichia coli - MIC*  ?  AMPICILLIN >=32 RESISTANT Resistant   ?  CEFAZOLIN >=64 RESISTANT Resistant   ?  CEFEPIME 16 RESISTANT Resistant   ?  CEFTAZIDIME RESISTANT Resistant   ?  CEFTRIAXONE >=64 RESISTANT R

## 2022-01-30 DIAGNOSIS — N400 Enlarged prostate without lower urinary tract symptoms: Secondary | ICD-10-CM | POA: Diagnosis not present

## 2022-01-30 DIAGNOSIS — I5022 Chronic systolic (congestive) heart failure: Secondary | ICD-10-CM | POA: Diagnosis not present

## 2022-01-30 DIAGNOSIS — R7881 Bacteremia: Secondary | ICD-10-CM | POA: Diagnosis not present

## 2022-01-30 DIAGNOSIS — I825Y1 Chronic embolism and thrombosis of unspecified deep veins of right proximal lower extremity: Secondary | ICD-10-CM

## 2022-01-30 LAB — CULTURE, BLOOD (ROUTINE X 2)
Culture: NO GROWTH
Special Requests: ADEQUATE

## 2022-01-30 LAB — CBC
HCT: 29.9 % — ABNORMAL LOW (ref 39.0–52.0)
Hemoglobin: 9.5 g/dL — ABNORMAL LOW (ref 13.0–17.0)
MCH: 28.6 pg (ref 26.0–34.0)
MCHC: 31.8 g/dL (ref 30.0–36.0)
MCV: 90.1 fL (ref 80.0–100.0)
Platelets: 214 10*3/uL (ref 150–400)
RBC: 3.32 MIL/uL — ABNORMAL LOW (ref 4.22–5.81)
RDW: 14.1 % (ref 11.5–15.5)
WBC: 5.6 10*3/uL (ref 4.0–10.5)
nRBC: 0 % (ref 0.0–0.2)

## 2022-01-30 LAB — BASIC METABOLIC PANEL
Anion gap: 11 (ref 5–15)
BUN: 86 mg/dL — ABNORMAL HIGH (ref 8–23)
CO2: 19 mmol/L — ABNORMAL LOW (ref 22–32)
Calcium: 8.8 mg/dL — ABNORMAL LOW (ref 8.9–10.3)
Chloride: 108 mmol/L (ref 98–111)
Creatinine, Ser: 2.9 mg/dL — ABNORMAL HIGH (ref 0.61–1.24)
GFR, Estimated: 22 mL/min — ABNORMAL LOW (ref >60)
Glucose, Bld: 111 mg/dL — ABNORMAL HIGH (ref 70–99)
Potassium: 4.3 mmol/L (ref 3.5–5.1)
Sodium: 138 mmol/L (ref 135–145)

## 2022-01-30 LAB — GLUCOSE, CAPILLARY
Glucose-Capillary: 101 mg/dL — ABNORMAL HIGH (ref 70–99)
Glucose-Capillary: 145 mg/dL — ABNORMAL HIGH (ref 70–99)
Glucose-Capillary: 138 mg/dL — ABNORMAL HIGH (ref 70–99)
Glucose-Capillary: 114 mg/dL — ABNORMAL HIGH (ref 70–99)

## 2022-01-30 LAB — RAPID URINE DRUG SCREEN, HOSP PERFORMED
Amphetamines: NOT DETECTED
Barbiturates: NOT DETECTED
Benzodiazepines: POSITIVE — AB
Cocaine: NOT DETECTED
Opiates: NOT DETECTED
Tetrahydrocannabinol: NOT DETECTED

## 2022-01-30 NOTE — TOC Progression Note (Addendum)
Transition of Care (TOC) - Initial/Assessment Note  ? ? ?Patient Details  ?Name: Tim Bradley ?MRN: [id number] ?Date of Birth: Aug 11, 1951 ? ?Transition of Care (TOC) CM/SW Contact:    ?Paulene Floor Dakoda Bassette, LCSWA ?Phone Number: ?01/30/2022, 9:08 AM ? ?Clinical Narrative:                 ?CSW met with the patient and patient's sister/ POA at bedside.  The sister gave CSW a copy of the [place] and Durable POA paperwork.  (This information was placed in the patent's file.)  CSW updated the family on the barriers to securing SNF placement.  The patient reports that he does not smoke cigarettes.  He and the sister report that he has not smoked or drank any alcohol in over a year.  The patient and sister also reports that he has not used cocaine or marijuana since the death of his wife 5 years ago.   ? ?Patient has no bed offers at this time. ? ?TOC will continue to follow.  ? ?  ?Barriers to Discharge: Continued Medical Work up ? ? ?Patient Goals and CMS Choice ?Patient states their goals for this hospitalization and ongoing recovery are:: Toreturn home after rehab ?CMS Medicare.gov Compare Post Acute Care list provided to:: Patient ?Choice offered to / list presented to : Patient ? ?Expected Discharge Plan and Services ?  ?  ?Discharge Planning Services: CM Consult ?  ?Living arrangements for the past 2 months: Apartment ?                ?  ?  ?  ?  ?  ?  ?  ?  ?  ?  ? ?Prior Living Arrangements/Services ?Living arrangements for the past 2 months: Apartment ?Lives with:: Self ?Patient language and need for interpreter reviewed:: Yes ?Do you feel safe going back to the place where you live?: Yes      ?Need for Family Participation in Patient Care: Yes (Comment) ?Care giver support system in place?: Yes (comment) ?Current home services: DME [facility] Rile, manual and electric wheelchair.) ?Criminal Activity/Legal Involvement Pertinent to Current Situation/Hospitalization: No - Comment as needed ? ?Activities of Daily Living ?Home  Assistive Devices/Equipment: Bedside commode/3-in-1, Electric scooter, Dentures (specify type), [facility] bed, Transfer belt, Wheelchair (Upper and lower dentures) ?ADL Screening (condition at time of admission) ?Patient's cognitive ability adequate to safely complete daily activities?: Yes ?Is the patient deaf or have difficulty hearing?: Yes (Right side ear) ?Does the patient have difficulty seeing, even when wearing glasses/contacts?: No ?Does the patient have difficulty concentrating, remembering, or making decisions?: No ?Patient able to express need for assistance with ADLs?: Yes ?Does the patient have difficulty dressing or bathing?: Yes ?Independently performs ADLs?: No ?Communication: Independent ?Dressing (OT): Needs assistance ?Is this a change from baseline?: Pre-admission baseline ?Grooming: Needs assistance ?Is this a change from baseline?: Pre-admission baseline ?Feeding: Independent ?Bathing: Needs assistance ?Is this a change from baseline?: Pre-admission baseline ?Toileting: Needs assistance ?Is this a change from baseline?: Pre-admission baseline ?In/Out Bed: Needs assistance ?Is this a change from baseline?: Pre-admission baseline ?Walks in Home: Needs assistance ?Is this a change from baseline?: Pre-admission baseline ?Does the patient have difficulty walking or climbing stairs?: Yes ?Weakness of Legs: Both ?Weakness of Arms/Hands: None ? ?Permission Sought/Granted ?Permission sought to share information with : Case Manager, Customer service manager, Family Supports ?Permission granted to share information with : Yes, Verbal Permission Granted ?   ?   ?   ?   ? ?  Emotional Assessment ?Appearance:: Appears stated age ?Attitude/Demeanor/Rapport: Engaged, Gracious ?Affect (typically observed): Accepting, Appropriate, Calm, Hopeful ?Orientation: : Oriented to Self, Oriented to Place, Oriented to  Time, Oriented to Situation ?Alcohol / Substance Use: Not Applicable ?Psych  Involvement: No (comment) ? ?Admission diagnosis:  Cystitis, acute [N30.00] ?Acute cystitis without hematuria [N30.00] ?Patient Active Problem List  ? Diagnosis Date Noted  ? E coli bacteremia 01/29/2022  ? Situational anxiety 01/26/2022  ? Physical deconditioning 01/26/2022  ? CKD (chronic kidney disease) stage 4, GFR 15-29 ml/min (HCC) 01/25/2022  ? Anemia of chronic disease 01/25/2022  ? Sepsis secondary to UTI ([place]) 01/24/2022  ? Acute cystitis 01/24/2022  ? Class 1 obesity 12/22/2021  ? Acute on chronic systolic heart failure ([place]) 12/21/2021  ? DNR (do not resuscitate) 12/21/2021  ? DVT (deep venous thrombosis) ([place]) 12/21/2021  ? Substance induced mood disorder ([place]) 10/27/2020  ? Right leg DVT ([place]) 09/30/2020  ? Seizure disorder ([place]) 09/29/2020  ? Chronic systolic CHF (congestive heart failure) ([place]) 09/28/2020  ? AKI (acute kidney injury) ([place]) 09/28/2020  ? Pressure injury of skin 05/03/2020  ? Acute encephalopathy 05/02/2020  ? Diarrhea 05/02/2020  ? Acute combined systolic and diastolic congestive heart failure ([place]) 04/22/2020  ? Essential hypertension 04/11/2020  ? Leg edema 04/11/2020  ? Dyspnea 04/11/2020  ? Acute on chronic congestive heart failure ([place]) 04/11/2020  ? BPH (benign prostatic hyperplasia) 04/11/2020  ? Acute kidney injury superimposed on chronic kidney disease ([place]) 04/11/2020  ? Generalized weakness 04/11/2020  ? Seizure ([place]) 07/21/2019  ? Cocaine abuse ([place])   ? Constipation 08/22/2018  ? Gastritis 08/20/2018  ? Heme positive stool   ? Acute blood loss anemia secondary to massive gastric ulcer   ? Hyponatremia   ? Abscess, gluteal, left 03/26/2017  ? Type 2 diabetes mellitus with hyperlipidemia ([place]) 03/26/2017  ? Adrenal nodule ([place]) 03/26/2017  ? Closed fracture of body of thoracic vertebra ([place]) 03/26/2017  ? Spinal stenosis of lumbar region 03/26/2017  ? ?PCP:  [place]:   ?[id number] [place], [place] - [address] AT [id number]?Phone: [phone]: [phone]? ?[place] [address] ?[address]?Phone: [phone]: [phone]? ? ? ? ?Social Determinants of Health (SDOH) Interventions ?  ? ?Readmission Risk Interventions ? ?  10/01/2020  ? 12:53 PM 07/24/2019  ? 11:09 AM  ?Readmission Risk Prevention Plan  ?Transportation Screening Complete Complete  ?PCP or Specialist Appt within 3-5 Days  Complete  ?[place] Home Care Consult  Complete  ?Social Work Consult for [place]/Counseling  Complete  ?Palliative Care Screening  Not Applicable  ?Medication Review (Press photographer Complete Complete  ?PCP or Specialist appointment within 3-5 days of discharge Complete   ?[place] Home Care Consult Complete   ?SW Recovery Care/Counseling Consult Complete   ?Palliative Care Screening Complete   ?S[place]Patient Refused   ? ? ? ?

## 2022-01-30 NOTE — Progress Notes (Signed)
?PROGRESS NOTE ? ? ? ?Tim Bradley  EHM:[id number] DOB: 1951-01-21 DOA: 01/24/2022 ?PCP: [facility]  ? ?Brief Narrative:  ?71 y/o with h/o cardiomyopathy, DM type II, HTN, BPH, CKD stage IV, seizure disorder presented with headache and chills.  He was admitted for UTI and was started on IV antibiotics.  He was subsequently found to have ESBL E. coli bacteremia; antibiotics switched to meropenem.  ID was consulted.  PT recommends SNF placement. ? ?Assessment & Plan: ?  ?ESBL E. coli bacteremia ?Sepsis secondary to UTI: Present on admission ?UTI: Present on admission ?-Repeat blood cultures from 01/25/2022 have been negative so far.  Urine culture grew E. coli and Morganella morganii ?-Currently on meropenem.  ID recommended to continue meropenem for total of 7 days till 01/31/2022. ?-Sepsis has resolved.  Currently afebrile and hemodynamically stable. ? ?History of DVT ?-Continue Eliquis ? ?Hyponatremia ?-Resolved ? ?CKD stage IV ?-Baseline creatinine around 2.5-3.5.  Creatinine currently stable ? ?History of COPD ?-Currently stable.  Continue inhaled steroids.  Oxygen supplementation as needed ? ?Essential hypertension ?Hyperlipidemia ?-Blood pressure stable.  Continue Coreg, torsemide and statin.  Patient/sister stated that he does not take hydralazine or isosorbide dinitrate at home.  Will DC these medications. ? ?Diabetes mellitus type 2 with hyperglycemia ?-Last A1c at [facility] on 12/27/2021 was 7.1.  Continue CBGs with SSI.  Carb modified diet ? ?BPH ?-Continue Flomax ? ?Seizure disorder ?-Sister at bedside mentions today that patient has never taken Keppra as an outpatient and has not taken Depakote for quite some time.  He is seizure-free.  Apparently his neurologist wants to see him again before deciding what to do with Depakote.  Depakote has remained on hold during this hospitalization because of interaction with meropenem.  We will DC Keppra as well.  Outpatient follow-up with neurology. ? ?Chronic systolic  CHF ?-Last echo available from 04/11/2020 showed EF of less than 20%.  Echo done during this admission showed EF of 35 to 40% with global hypokinesis and moderate LVH ?-Strict input and output.  Daily weights.  Fluid restriction.  Continue Coreg, and torsemide.  Outpatient follow-up with cardiology ? ?Physical deconditioning ?-Patient is very deconditioned ?-For the last 8 months, he has not been able to get up and walk.   ?-PT recommends SNF placement.  Social worker following. ? ?Situational anxiety ?-Continue low-dose Xanax as needed ? ?Anemia of chronic disease ?-Hemoglobin stable.  Monitor intermittently ? ?Possible B12 deficiency ?-B12 266.  Started on supplementation: Continue ? ?Constipation ?-Continue bowel regimen. ? ?Obesity ?-Outpatient follow-up ? ?DVT prophylaxis: Eliquis ?Code Status: DNR ?Family Communication: Sister at bedside ?Disposition Plan: ?Status is: Inpatient ?Remains inpatient appropriate because: Of severity of illness.  Need for SNF placement possibly tomorrow once meropenem has been completed ? ?Consultants: ID ? ?Procedures: Echo ? ?Antimicrobials:  ?Anti-infectives (From admission, onward)  ? ? Start     Dose/Rate Route Frequency Ordered Stop  ? 01/29/22 1000  meropenem (MERREM) 1 g in sodium chloride 0.9 % 100 mL IVPB       ? 1 g ?200 mL/hr over 30 Minutes Intravenous Every 12 hours 01/29/22 0839 01/31/22 2359  ? 01/25/22 1000  meropenem (MERREM) 1 g in sodium chloride 0.9 % 100 mL IVPB  Status:  Discontinued       ? 1 g ?200 mL/hr over 30 Minutes Intravenous Every 12 hours 01/25/22 0852 01/29/22 0839  ? 01/24/22 1700  cefTRIAXone (ROCEPHIN) 1 g in sodium chloride 0.9 % 100 mL IVPB  Status:  Discontinued       ? 1 g ?200 mL/hr over 30 Minutes Intravenous Every 24 hours 01/24/22 2204 01/25/22 0851  ? 01/24/22 1630  cefTRIAXone (ROCEPHIN) 2 g in sodium chloride 0.9 % 100 mL IVPB       ? 2 g ?200 mL/hr over 30 Minutes Intravenous  Once 01/24/22 1624 01/24/22 1748  ? ?   ? ? ? ?Subjective: ?Patient seen and examined at bedside.  Patient is a poor historian.  No seizures, agitation, vomiting or fever reported.  Complains of intermittent anxiety and asking for Xanax. ?Objective: ?Vitals:  ? 01/29/22 1510 01/29/22 2036 01/29/22 2101 01/30/22 6606  ?BP: 116/74  134/79 133/72  ?Pulse: 78 81 72 68  ?Resp: 16 18 18 18   ?Temp: 98.1 ?F (36.7 ?C)  98 ?F (36.7 ?C) 98.5 ?F (36.9 ?C)  ?TempSrc: Oral  Oral Oral  ?SpO2: 92%  94% 99%  ?Weight:      ?Height:      ? ? ?Intake/Output Summary (Last 24 hours) at 01/30/2022 0737 ?Last data filed at 01/29/2022 2101 ?Gross per 24 hour  ?Intake 100 ml  ?Output 1100 ml  ?Net -1000 ml  ? ? ?Filed Weights  ? 01/24/22 2329 01/28/22 2100  ?Weight: 115.3 kg 116 kg  ? ? ?Examination: ? ?General: On 2 L oxygen via nasal cannula.  No distress.  Chronically ill and deconditioned looking. ?ENT/neck: No thyromegaly.  JVD is not elevated  ?respiratory: Decreased breath sounds at bases bilaterally with some crackles; no wheezing  ?CVS: S1-S2 heard, rate controlled currently ?Abdominal: Soft, nontender, slightly distended; no organomegaly, normal bowel sounds are heard ?Extremities: Trace lower extremity edema; no cyanosis  ?CNS: Still extremely slow to respond.  Poor historian.  No focal neurologic deficit.  Moves extremities ?Lymph: No obvious lymphadenopathy ?Skin: No obvious ecchymosis/lesions  ?psych: Currently not agitated.  Affect is extremely flat. ?musculoskeletal: No obvious joint swelling/deformity ?Genitourinary: Indwelling Foley catheter present ? ? ? ? ?Data Reviewed: I have personally reviewed following labs and imaging studies ? ?CBC: ?Recent Labs  ?Lab 01/26/22 ?0112 01/27/22 ?3016 01/28/22 ?0411 01/29/22 ?0109 01/30/22 ?0251  ?WBC 6.9 4.3 4.7 4.6 5.6  ?HGB 10.2* 9.3* 9.4* 9.9* 9.5*  ?HCT 31.3* 29.0* 28.6* 31.0* 29.9*  ?MCV 89.9 89.2 89.1 88.6 90.1  ?PLT 130* 160 185 219 214  ? ? ?Basic Metabolic Panel: ?Recent Labs  ?Lab 01/26/22 ?0112 01/27/22 ?3235  01/28/22 ?0411 01/29/22 ?5732 01/30/22 ?0251  ?NA 133* 138 133* 137 138  ?K 4.1 3.7 4.0 4.1 4.3  ?CL 105 107 105 105 108  ?CO2 18* 21* 20* 21* 19*  ?GLUCOSE 149* 120* 118* 116* 111*  ?BUN 82* 90* 91* 86* 86*  ?CREATININE 3.19* 3.54* 3.43* 3.04* 2.90*  ?CALCIUM 8.4* 8.7* 8.7* 8.7* 8.8*  ? ? ?GFR: ?Estimated Creatinine Clearance: 30.9 mL/min (A) (by C-G formula based on SCr of 2.9 mg/dL (H)). ?Liver Function Tests: ?Recent Labs  ?Lab 01/24/22 ?1621  ?AST 12*  ?ALT 12  ?ALKPHOS 49  ?BILITOT 0.5  ?PROT 7.3  ?ALBUMIN 3.4*  ? ? ?No results for input(s): LIPASE, AMYLASE in the last 168 hours. ?No results for input(s): AMMONIA in the last 168 hours. ?Coagulation Profile: ?Recent Labs  ?Lab 01/24/22 ?1540  ?INR 1.4*  ? ? ?Cardiac Enzymes: ?No results for input(s): CKTOTAL, CKMB, CKMBINDEX, TROPONINI in the last 168 hours. ?BNP (last 3 results) ?No results for input(s): PROBNP in the last 8760 hours. ?HbA1C: ?No results for input(s): HGBA1C in the last 72  hours. ?CBG: ?Recent Labs  ?Lab 01/29/22 ?0809 01/29/22 ?1102 01/29/22 ?1509 01/29/22 ?2100 01/30/22 ?0720  ?GLUCAP 152* 137* 188* 87 101*  ? ? ?Lipid Profile: ?No results for input(s): CHOL, HDL, LDLCALC, TRIG, CHOLHDL, LDLDIRECT in the last 72 hours. ?Thyroid Function Tests: ?No results for input(s): TSH, T4TOTAL, FREET4, T3FREE, THYROIDAB in the last 72 hours. ?Anemia Panel: ?No results for input(s): VITAMINB12, FOLATE, FERRITIN, TIBC, IRON, RETICCTPCT in the last 72 hours. ?Sepsis Labs: ?Recent Labs  ?Lab 01/24/22 ?2035 01/25/22 ?0223 01/25/22 ?0317  ?LATICACIDVEN 1.6 2.5* 1.3  ? ? ? ?Recent Results (from the past 240 hour(s))  ?Blood Culture (routine x 2)     Status: Abnormal  ? Collection Time: 01/24/22  4:10 PM  ? Specimen: BLOOD  ?Result Value Ref Range Status  ? Specimen Description BLOOD RIGHT ANTECUBITAL  Final  ? Special Requests   Final  ?  BOTTLES DRAWN AEROBIC AND ANAEROBIC Blood Culture adequate volume  ? Culture  Setup Time   Final  ?  GRAM NEGATIVE RODS ?IN  BOTH AEROBIC AND ANAEROBIC BOTTLES ?CRITICAL RESULT CALLED TO, READ BACK BY AND VERIFIED WITH: PHARMD K PIERCE [id number] AT 843 AM BY CM ?Performed at [facility], [place] [address] [place], [place] [address] ?  ? Culture (

## 2022-01-30 NOTE — Progress Notes (Signed)
Occupational Therapy Treatment ?Patient Details ?Name: Tim Bradley ?MRN: [id number] ?DOB: [birth year] ?Today's Date: 01/30/2022 ? ? ?History of present illness 71 yo male admitted with high fever , HA, and chills. UT (+) Pt with sepsis secondary to UTI e coli bacteria Pt using w/c at baseline with previous SNF admission PMH DM HTN BPH CKD seizure disorder COPD anixety DVT on eliquis ?  ?OT comments ? Patient received in supine and declined getting out of bed due to complaints of BLE pain from sitting up too long yesterday. Patient participated in EOB sitting activities including grooming and reaching tasks. Patient performed scooting towards [facility] with mod assist to perform. Acute OT to continue to follow.   ? ?Recommendations for follow up therapy are one component of a multi-disciplinary discharge planning process, led by the attending physician.  Recommendations may be updated based on patient status, additional functional criteria and insurance authorization. ?   ?Follow Up Recommendations ? Acute inpatient rehab (3hours/day)  ?  ?Assistance Recommended at Discharge Intermittent Supervision/Assistance  ?Patient can return home with the following ? Two people to help with walking and/or transfers;A lot of help with bathing/dressing/bathroom;Assist for transportation ?  ?Equipment Recommendations ? Other (comment) (TBA)  ?  ?Recommendations for Other Services   ? ?  ?Precautions / Restrictions Precautions ?Precautions: Fall  ? ? ?  ? ?Mobility Bed Mobility ?Overal bed mobility: Needs Assistance ?Bed Mobility: Rolling, Supine to Sit ?Rolling: Min assist ?  ?Supine to sit: Mod assist, HOB elevated ?  ?  ?General bed mobility comments: required assistance to get BLEs off EOB and to raise trunk ?  ? ?Transfers ?  ?  ?  ?  ?  ?  ?  ?  ?  ?General transfer comment: declined transfer ?  ?  ?Balance Overall balance assessment: Needs assistance ?Sitting-balance support: Single extremity supported, Bilateral upper extremity  supported, Feet supported ?Sitting balance-Leahy Scale: Fair ?Sitting balance - Comments: performed reaching tasks with min guard assist ?  ?  ?  ?  ?  ?  ?  ?  ?  ?  ?  ?  ?  ?  ?  ?   ? ?ADL either performed or assessed with clinical judgement  ? ?ADL Overall ADL's : Needs assistance/impaired ?Eating/Feeding: Set up;Sitting ?  ?Grooming: Wash/dry hands;Wash/dry face;Supervision/safety;Sitting ?Grooming Details (indicate cue type and reason): on EOB ?  ?  ?  ?  ?  ?  ?  ?  ?  ?  ?  ?  ?  ?  ?  ?  ?  ? ?Extremity/Trunk Assessment   ?  ?  ?  ?  ?  ? ?Vision   ?  ?  ?Perception   ?  ?Praxis   ?  ? ?Cognition Arousal/Alertness: Awake/alert ?Behavior During Therapy: [facility] for tasks assessed/performed ?Overall Cognitive Status: Within Functional Limits for tasks assessed ?  ?  ?  ?  ?  ?  ?  ?  ?  ?  ?  ?  ?  ?  ?  ?  ?General Comments: declined getting out out bed ?  ?  ?   ?Exercises   ? ?  ?Shoulder Instructions   ? ? ?  ?General Comments    ? ? ?Pertinent Vitals/ Pain       Pain Assessment ?Pain Assessment: Faces ?Faces Pain Scale: Hurts a little bit ?Pain Location: BLE knees ?Pain Descriptors / Indicators: Aching, Discomfort ?Pain Intervention(s): Limited activity within patient's  tolerance, Repositioned, Monitored during session ? ?Home Living   ?  ?  ?  ?  ?  ?  ?  ?  ?  ?  ?  ?  ?  ?  ?  ?  ?  ?  ? ?  ?Prior Functioning/Environment    ?  ?  ?  ?   ? ?Frequency ? Min 2X/week  ? ? ? ? ?  ?Progress Toward Goals ? ?OT Goals(current goals can now be found in the care plan section) ? Progress towards OT goals: Progressing toward goals ? ?Acute Rehab OT Goals ?Patient Stated Goal: get better ?OT Goal Formulation: With patient ?Time For Goal Achievement: 02/10/22 ?Potential to Achieve Goals: Good ?ADL Goals ?Pt Will Perform Upper Body Bathing: with modified independence;sitting ?Pt Will Perform Lower Body Bathing: with min assist;sit to/from stand ?Pt Will Transfer to Toilet: with min assist;with transfer board;bedside  commode ?Additional ADL Goal #1: pt will complete bed mobility supervision level as precursor to adls.  ?Plan Discharge plan remains appropriate   ? ?Co-evaluation ? ? ?   ?  ?  ?  ?  ? ?  ?AM-PAC OT "6 Clicks" Daily Activity     ?Outcome Measure ? ? Help from another person eating meals?: None ?Help from another person taking care of personal grooming?: None ?Help from another person toileting, which includes using toliet, bedpan, or urinal?: A Lot ?Help from another person bathing (including washing, rinsing, drying)?: A Lot ?Help from another person to put on and taking off regular upper body clothing?: A Little ?Help from another person to put on and taking off regular lower body clothing?: A Lot ?6 Click Score: 17 ? ?  ?End of Session   ? ?OT Visit Diagnosis: Unsteadiness on feet (R26.81);Muscle weakness (generalized) (M62.81) ?  ?Activity Tolerance Patient tolerated treatment well ?  ?Patient Left in bed;with call bell/phone within reach;with bed alarm set;with family/visitor present ?  ?Nurse Communication Mobility status ?  ? ?   ? ?Time: 1140-1157 ?OT Time Calculation (min): 17 min ? ?Charges: OT General Charges ?$OT Visit: 1 Visit ?OT Treatments ?$Therapeutic Activity: 8-22 mins ? ?Lodema Hong, OTA ?Acute Rehabilitation Services  ?Pager [phone] ?Office [phone] ? ? ?[place] ?01/30/2022, 12:51 PM ?

## 2022-01-31 DIAGNOSIS — N3000 Acute cystitis without hematuria: Secondary | ICD-10-CM | POA: Diagnosis not present

## 2022-01-31 DIAGNOSIS — A419 Sepsis, unspecified organism: Secondary | ICD-10-CM | POA: Diagnosis not present

## 2022-01-31 DIAGNOSIS — N390 Urinary tract infection, site not specified: Secondary | ICD-10-CM | POA: Diagnosis not present

## 2022-01-31 LAB — GLUCOSE, CAPILLARY
Glucose-Capillary: 146 mg/dL — ABNORMAL HIGH (ref 70–99)
Glucose-Capillary: 112 mg/dL — ABNORMAL HIGH (ref 70–99)
Glucose-Capillary: 135 mg/dL — ABNORMAL HIGH (ref 70–99)
Glucose-Capillary: 99 mg/dL (ref 70–99)

## 2022-01-31 LAB — BASIC METABOLIC PANEL
Anion gap: 8 (ref 5–15)
BUN: 80 mg/dL — ABNORMAL HIGH (ref 8–23)
CO2: 23 mmol/L (ref 22–32)
Calcium: 8.9 mg/dL (ref 8.9–10.3)
Chloride: 108 mmol/L (ref 98–111)
Creatinine, Ser: 2.71 mg/dL — ABNORMAL HIGH (ref 0.61–1.24)
GFR, Estimated: 24 mL/min — ABNORMAL LOW (ref >60)
Glucose, Bld: 120 mg/dL — ABNORMAL HIGH (ref 70–99)
Potassium: 4.3 mmol/L (ref 3.5–5.1)
Sodium: 139 mmol/L (ref 135–145)

## 2022-01-31 LAB — MAGNESIUM: Magnesium: 2.2 mg/dL (ref 1.7–2.4)

## 2022-01-31 MED ORDER — MOMETASONE FURO-FORMOTEROL FUM 200-5 MCG/ACT IN AERO: 2.0000 | INHALATION_SPRAY | Freq: Two times a day (BID) | RESPIRATORY_TRACT | 0 refills | Status: DC

## 2022-01-31 MED ORDER — TORSEMIDE 20 MG PO TABS: 20.0000 mg | ORAL_TABLET | Freq: Every day | ORAL | 0 refills | Status: DC

## 2022-01-31 MED ORDER — CYANOCOBALAMIN 1000 MCG PO TABS: 1000.0000 ug | ORAL_TABLET | Freq: Every day | ORAL | 0 refills | Status: AC

## 2022-01-31 NOTE — Progress Notes (Signed)
DISCHARGE NOTE SNF ?[name] to be discharged [facility] per MD order. Patient verbalized understanding. ?Discharge packet assembled. An After Visit Summary (AVS) was printed and given to the EMS personnel. Patient escorted via stretcher and discharged to [facility] via ambulance. Report called to accepting facility By No Name; all questions and concerns addressed.  ? ?Babs Sciara, RN ?

## 2022-01-31 NOTE — TOC Transition Note (Signed)
Transition of Care (TOC) - CM/SW Discharge Note ? ? ?Patient Details  ?Name: Tim Bradley ?MRN: [id number] ?Date of Birth: [birth year] ? ?Transition of Care (TOC) CM/SW Contact:  ?Paulene Floor Danalee Flath, LCSWA ?Phone Number: ?01/31/2022, 3:50 PM ? ? ?Clinical Narrative:    ?Patient will DC to: [facility] ?Anticipated DC date: 01/31/2022 ?Family notified:  Yes ?Transport by: [name] ? ? ?Per MD patient ready for DC to SNF. RN to call report prior to discharge [phone] room 141. RN, patient, patient's family, and facility notified of DC. Discharge Summary and FL2 sent to facility. DC packet on chart. Ambulance transport requested for patient.  ? ?CSW will sign off for now as social work intervention is no longer needed. Please consult [place] again if new needs arise. ?  ? ? ?Final next level of care: [place] ?Barriers to Discharge: Barriers Resolved ? ? ?Patient Goals and CMS Choice ?Patient states their goals for this hospitalization and ongoing recovery are:: Toreturn home after rehab ?CMS Medicare.gov Compare Post Acute Care list provided to:: Patient Represenative (must comment) ?Choice offered to / list presented to : [facility] POA / Guardian, Patient ? ?Discharge Placement ?  ?           ?Patient chooses bed at: [facility] ?Patient to be transferred to facility by: [facility] ?Name of family member notified: [name] (Sister)   [phone] ?Patient and family notified of of transfer: 01/31/22 ? ?Discharge Plan and Services ?  ?Discharge Planning Services: CM Consult ?           ?  ?  ?  ?  ?  ?  ?  ?  ?  ?  ? ?Social Determinants of Health (SDOH) Interventions ?  ? ? ?Readmission Risk Interventions ? ?  10/01/2020  ? 12:53 PM 07/24/2019  ? 11:09 AM  ?Readmission Risk Prevention Plan  ?Transportation Screening Complete Complete  ?PCP or Specialist Appt within 3-5 Days  Complete  ?Helper or Home Care Consult  Complete  ?Social Work Consult for [place] Planning/Counseling  Complete   ?Palliative Care Screening  Not Applicable  ?Medication Review Press photographer) Complete Complete  ?PCP or Specialist appointment within 3-5 days of discharge Complete   ?[place] or Home Care Consult Complete   ?SW Recovery Care/Counseling Consult Complete   ?Palliative Care Screening Complete   ?[place] Patient Refused   ? ? ? ? ? ?

## 2022-01-31 NOTE — TOC Progression Note (Addendum)
Transition of Care (TOC) - Initial/Assessment Note  ? ? ?Patient Details  ?Name: Tim Bradley ?MRN: [id number] ?Date of Birth: [birth year] ? ?Transition of Care (TOC) CM/SW Contact:    ?Paulene Floor Karyssa Amaral, LCSWA ?Phone Number: ?01/31/2022, 10:33 AM ? ?Clinical Narrative:                 ?CSW informed the patient's sister that a bed offer was received from [facility] re[facility].  The sister toured the facility and the family accepted the bed offer.   ? ?CSW contacted the [place] and spoke with Jola Baptist, SW.  Ms. Damita Dunnings stated that she would message the team to inform them that a bed was chosen and that an [place] number is needed in order for the patient to discharge to the facility.   ? ?10:51-  CSW was informed by the [facility] that the patient's insurance authorization information will be sent to the facility today.  MD and floor RN notified.   ? ?Pending d/c summary. ?  ?Barriers to Discharge: Continued Medical Work up ? ? ?Patient Goals and CMS Choice ?Patient states their goals for this hospitalization and ongoing recovery are:: Toreturn home after rehab ?CMS Medicare.gov Compare Post Acute Care list provided to:: Patient ?Choice offered to / list presented to : Patient ? ?Expected Discharge Plan and Services ?  ?  ?Discharge Planning Services: CM Consult ?  ?Living arrangements for the past 2 months: Apartment ?Expected Discharge Date: 01/31/22               ?  ?  ?  ?  ?  ?  ?  ?  ?  ?  ? ?Prior Living Arrangements/Services ?Living arrangements for the past 2 months: Apartment ?Lives with:: Self ?Patient language and need for interpreter reviewed:: Yes ?Do you feel safe going back to the place where you live?: Yes      ?Need for Family Participation in Patient Care: Yes (Comment) ?Care giver support system in place?: Yes (comment) ?Current home services: DME [place] Rile, manual and electric wheelchair.) ?Criminal Activity/Legal Involvement Pertinent to Current Situation/Hospitalization: No - Comment as needed ? ?Activities  of Daily Living ?Home Assistive Devices/Equipment: Bedside commode/3-in-1, Electric scooter, Dentures (specify type), [facility] bed, Transfer belt, Wheelchair (Upper and lower dentures) ?ADL Screening (condition at time of admission) ?Patient's cognitive ability adequate to safely complete daily activities?: Yes ?Is the patient deaf or have difficulty hearing?: Yes (Right side ear) ?Does the patient have difficulty seeing, even when wearing glasses/contacts?: No ?Does the patient have difficulty concentrating, remembering, or making decisions?: No ?Patient able to express need for assistance with ADLs?: Yes ?Does the patient have difficulty dressing or bathing?: Yes ?Independently performs ADLs?: No ?Communication: Independent ?Dressing (OT): Needs assistance ?Is this a change from baseline?: Pre-admission baseline ?Grooming: Needs assistance ?Is this a change from baseline?: Pre-admission baseline ?Feeding: Independent ?Bathing: Needs assistance ?Is this a change from baseline?: Pre-admission baseline ?Toileting: Needs assistance ?Is this a change from baseline?: Pre-admission baseline ?In/Out Bed: Needs assistance ?Is this a change from baseline?: Pre-admission baseline ?Walks in Home: Needs assistance ?Is this a change from baseline?: Pre-admission baseline ?Does the patient have difficulty walking or climbing stairs?: Yes ?Weakness of Legs: Both ?Weakness of Arms/Hands: None ? ?Permission Sought/Granted ?Permission sought to share information with : Case Manager, Customer service manager, Family Supports ?Permission granted to share information with : Yes, Verbal Permission Granted ?   ?   ?   ?   ? ?  Emotional Assessment ?Appearance:: Appears stated age ?Attitude/Demeanor/Rapport: Engaged, Gracious ?Affect (typically observed): Accepting, Appropriate, Calm, Hopeful ?Orientation: : Oriented to Self, Oriented to Place, Oriented to  Time, Oriented to Situation ?Alcohol / Substance Use: Not  Applicable ?Psych Involvement: No (comment) ? ?Admission diagnosis:  Cystitis, acute [N30.00] ?Acute cystitis without hematuria [N30.00] ?Patient Active Problem List  ? Diagnosis Date Noted  ? E coli bacteremia 01/29/2022  ? Situational anxiety 01/26/2022  ? Physical deconditioning 01/26/2022  ? CKD (chronic kidney disease) stage 4, GFR 15-29 ml/min (HCC) 01/25/2022  ? Anemia of chronic disease 01/25/2022  ? Sepsis secondary to UTI ([place]) 01/24/2022  ? Acute cystitis 01/24/2022  ? Class 1 obesity 12/22/2021  ? Acute on chronic systolic heart failure ([place]) 12/21/2021  ? DNR (do not resuscitate) 12/21/2021  ? DVT (deep venous thrombosis) ([place]) 12/21/2021  ? Substance induced mood disorder ([place]) 10/27/2020  ? Right leg DVT ([place]) 09/30/2020  ? Seizure disorder ([place]) 09/29/2020  ? Chronic systolic CHF (congestive heart failure) ([place]) 09/28/2020  ? AKI (acute kidney injury) ([place]) 09/28/2020  ? Pressure injury of skin 05/03/2020  ? Acute encephalopathy 05/02/2020  ? Diarrhea 05/02/2020  ? Acute combined systolic and diastolic congestive heart failure ([place]) 04/22/2020  ? Essential hypertension 04/11/2020  ? Leg edema 04/11/2020  ? Dyspnea 04/11/2020  ? Acute on chronic congestive heart failure ([place]) 04/11/2020  ? BPH (benign prostatic hyperplasia) 04/11/2020  ? Acute kidney injury superimposed on chronic kidney disease ([place]) 04/11/2020  ? Generalized weakness 04/11/2020  ? Seizure ([place]) 07/21/2019  ? Cocaine abuse ([place])   ? Constipation 08/22/2018  ? Gastritis 08/20/2018  ? Heme positive stool   ? Acute blood loss anemia secondary to massive gastric ulcer   ? Hyponatremia   ? Abscess, gluteal, left 03/26/2017  ? Type 2 diabetes mellitus with hyperlipidemia ([place]) 03/26/2017  ? Adrenal nodule ([place]) 03/26/2017  ? Closed fracture of body of thoracic vertebra ([place]) 03/26/2017  ? Spinal stenosis of lumbar region 03/26/2017  ? ?PCP:  [place]:   ?[id number] - [place], [place] - [address] ?[place] ?[place] [address] ?Phone: [phone] Fax: [phone] ? ?[place], [place] - [address] ?[address] ?[place] [address] ?Phone: [phone] Fax: [phone] ? ? ? ? ?Social Determinants of Health (SDOH) Interventions ?  ? ?Readmission Risk Interventions ? ?  10/01/2020  ? 12:53 PM 07/24/2019  ? 11:09 AM  ?Readmission Risk Prevention Plan  ?Transportation Screening Complete Complete  ?PCP or Specialist Appt within 3-5 Days  Complete  ?[place] or Home Care Consult  Complete  ?Social Work Consult for [place] Planning/Counseling  Complete  ?Palliative Care Screening  Not Applicable  ?Medication Review Press photographer) Complete Complete  ?PCP or Specialist appointment within 3-5 days of discharge Complete   ?[place] or Home Care Consult Complete   ?SW Recovery Care/Counseling Consult Complete   ?Palliative Care Screening Complete   ?[place] Patient Refused   ? ? ? ?

## 2022-01-31 NOTE — Plan of Care (Signed)
?  Problem: Education: Goal: Knowledge of General Education information will improve Description: Including pain rating scale, medication(s)/side effects and non-pharmacologic comfort measures Outcome: Completed/Met   Problem: Health Behavior/Discharge Planning: Goal: Ability to manage health-related needs will improve Outcome: Completed/Met   Problem: Clinical Measurements: Goal: Ability to maintain clinical measurements within normal limits will improve Outcome: Completed/Met   

## 2022-01-31 NOTE — Discharge Summary (Signed)
PatientPhysician Discharge Summary  ?Tim Bradley OQH:[id number] DOB: 07/18/1951 DOA: 01/24/2022 ? ?PCP: [facility] ? ?Admit date: 01/24/2022 ?Discharge date: 01/31/2022 ?30 Day Unplanned Readmission Risk Score   ? ?Flowsheet Row ED to Hosp-Admission (Current) from 01/24/2022 in [facility]  ?30 Day Unplanned Readmission Risk Score (%) 36.82 Filed at 01/31/2022 0801  ? ?  ? ? This score is the patient's risk of an unplanned readmission within 30 days of being discharged (0 -100%). The score is based on dignosis, age, lab data, medications, orders, and past utilization.   ?Low:  0-14.9   Medium: 15-21.9   High: 22-29.9   Extreme: 30 and above ? ?  ? ?  ? ? ? ?Admitted From: Home ?Disposition: SNF ? ?Recommendations for Outpatient Follow-up:  ?Follow up with PCP in 1-2 weeks ?Please obtain BMP/CBC in one week ?Please follow up with your PCP on the following pending results: ?Unresulted Labs (From admission, onward)  ? ? None  ? ?  ?  ? ? ?Home Health: None ?Equipment/Devices: None ? ?Discharge Condition: Stable ?CODE STATUS: DNR ?Diet recommendation: Cardiac ? ?Subjective: Seen and examined.  He has no complaint. ? ?Brief/Interim Summary: 71 y/o with h/o cardiomyopathy, DM type II, HTN, BPH, CKD stage IV, seizure disorder presented with headache and chills.  He was admitted for UTI and was started on IV antibiotics.  He was subsequently found to have ESBL E. coli bacteremia; antibiotics switched to meropenem.  ID was consulted.  PT recommends SNF placement.  Details as below. ?  ?ESBL E. coli bacteremia ?Sepsis secondary to UTI: Present on admission ?UTI: Present on admission ?-Repeat blood cultures from 01/25/2022 have been negative so far.  Urine culture grew E. coli and Morganella morganii ?-Currently on meropenem.  ID recommended to continue meropenem for total of 7 days till 01/31/2022.  He will receive his last dose today before discharge. ?-Sepsis has resolved.  Currently afebrile and  hemodynamically stable. ?  ?History of DVT ?-Continue Eliquis ? ?Hyponatremia ?-Resolved ? ?CKD stage IV ?-Baseline creatinine around 2.5-3.5.  Creatinine currently stable ?  ?History of COPD ?-Currently stable.  Continue inhaled steroids.  Oxygen supplementation as needed.  Will be discharged on Brovana. ? ?Essential hypertension ?Hyperlipidemia ?-Blood pressure stable.  Continue Coreg, torsemide and statin.  Patient/sister stated that he does not take hydralazine or isosorbide dinitrate at home.  These medications are discontinued. ?  ?Diabetes mellitus type 2 with hyperglycemia ?-Last A1c at [facility] on 12/27/2021 was 7.1.  Continue CBGs with SSI.  Carb modified diet ?  ?BPH ?-Continue Flomax ?  ?Seizure disorder ?-Sister informed previous hospitalist that patient has never taken [place] as an outpatient and has not taken Depakote for quite some time.  He is seizure-free.  Apparently his neurologist wants to see him again before deciding what to do with Depakote.  Depakote has remained on hold during this hospitalization because of interaction with meropenem and Keppra was also discontinued per sister wishes and information.  Outpatient follow-up with neurology. ?  ?Chronic systolic CHF ?-Last echo available from 04/11/2020 showed EF of less than 20%.  Echo done during this admission showed EF of 35 to 40% with global hypokinesis and moderate LVH ?-Strict input and output.  Daily weights.  Fluid restriction.  Continue Coreg, and torsemide.  Outpatient follow-up with cardiology ?  ?Physical deconditioning ?-Patient is very deconditioned ?-For the last 8 months, he has not been able to get up and walk.   ?-PT recommends  SNF placement which has been arranged for him and he is going to be discharged in stable condition today. ?  ?Situational anxiety: He was given Xanax here.  Will not be discharged on this due to abuse and dependency potential. ?  ?Anemia of chronic disease ?-Hemoglobin stable.  Monitor  intermittently ? ?Possible B12 deficiency ?-B12 266.  Started on supplementation: Discharging on supplementation.  Recommend checking labs in 30 days or so. ?  ? ?Discharge Diagnoses:  ?Active Problems: ?  Sepsis secondary to UTI [facility]) ?  Acute cystitis ?  DVT (deep venous thrombosis) ([place]) ?  Essential hypertension ?  Dyspnea ?  Type 2 diabetes mellitus with hyperlipidemia ([place]) ?  CKD (chronic kidney disease) stage 4, GFR 15-29 ml/min (H[facility]) ?  BPH (benign prostatic hyperplasia) ?  DNR (do not resuscitate) ?  Seizure disorder ([place]) ?  Chronic systolic CHF (congestive heart failure) ([place]) ?  Constipation ?  Anemia of chronic disease ?  Situational anxiety ?  Physical deconditioning ?  E coli bacteremia ? ? ? ?Discharge Instructions ? ? ?Allergies as of 01/31/2022   ? ?   Reactions  ? Trazodone And Nefazodone   ? Family reports seizures from this medication  ? ?  ? ?  ?Medication List  ?  ? ?STOP taking these medications   ? ?divalproex 250 MG 24 hr tablet ?Commonly known as: Depakote ER ?  ?DULoxetine 60 MG capsule ?Commonly known as: CYMBALTA ?  ?hydrALAZINE 25 MG tablet ?Commonly known as: APRESOLINE ?  ?isosorbide dinitrate 20 MG tablet ?Commonly known as: ISORDIL ?  ?levETIRAcetam 750 MG tablet ?Commonly known as: Keppra ?  ? ?  ? ?TAKE these medications   ? ?acetaminophen 500 MG tablet ?Commonly known as: TYLENOL ?Take 1,000 mg by mouth every 6 (six) hours as needed for moderate pain or headache. ?  ?apixaban 5 MG Tabs tablet ?Commonly known as: ELIQUIS ?Take 1 tablet (5 mg total) by mouth 2 (two) times daily. ?  ?atorvastatin 80 MG tablet ?Commonly known as: LIPITOR ?Take 1 tablet (80 mg total) by mouth daily. ?  ?carvedilol 6.25 MG tablet ?Commonly known as: COREG ?Take 1 tablet (6.25 mg total) by mouth 2 (two) times daily with a meal. ?  ?cyanocobalamin 1000 MCG tablet ?Take 1 tablet (1,000 mcg total) by mouth daily. ?Start taking on: Feb 01, 2022 ?  ?mometasone-formoterol 200-5 MCG/ACT Aero ?Commonly known as:  DULERA ?Inhale 2 puffs into the lungs 2 (two) times daily. ?  ?QC Vitamin D3 50 MCG (2000 UT) Tabs ?Generic drug: Cholecalciferol ?Take 2,000 Units by mouth daily. ?  ?tamsulosin 0.4 MG Caps capsule ?Commonly known as: FLOMAX ?Take 1 capsule (0.4 mg total) by mouth daily after supper. ?  ?torsemide 20 MG tablet ?Commonly known as: DEMADEX ?Take 1 tablet (20 mg total) by mouth at bedtime. ?What changed:  ?medication strength ?how much to take ?when to take this ?  ?VISINE-A OP ?Place 1 drop into both eyes daily as needed (dry/itchy eyes). ?  ? ?  ? ? Follow-up Information   ? ? [facility] up in 1 week(s).   ?Specialty: General Practice ?Contact information: ?[place] ?[place] [address] ?[phone] ? ? ?  ?  ? ? Josue Hector, MD .   ?Specialty: Cardiology ?Contact information: ?[address] ?[address] ?[place] [address] ?[phone] ? ? ?  ?  ? ?  ?  ? ?  ? ?Allergies  ?Allergen Reactions  ?  Trazodone And Nefazodone   ?  Family reports seizures from this medication  ? ? ?Consultations: ID ? ? ?Procedures/Studies: ?DG Chest 2 View ? ?Result Date: 01/17/2022 ?CLINICAL DATA:  sob, no cough EXAM: CHEST - 2 VIEW COMPARISON:  January 10, 2022. FINDINGS: Similar cardiomediastinal silhouette. No consolidation. No visible pleural effusions or pneumothorax. Cardiomediastinal silhouette is within normal limits. No acute osseous abnormality. IMPRESSION: No evidence of acute cardiopulmonary disease. Electronically Signed   By: Margaretha Sheffield M.D.   On: 01/17/2022 08:32  ? ?CT Head Wo Contrast ? ?Result Date: 01/24/2022 ?CLINICAL DATA:  Headache, new or worsening (Age >= 50y) EXAM: CT HEAD WITHOUT CONTRAST TECHNIQUE: Contiguous axial images were obtained from the base of the skull through the vertex without intravenous contrast. RADIATION DOSE REDUCTION: This exam was performed according to the departmental dose-optimization program which includes automated exposure control, adjustment  of the mA and/or kV according to patient size and/or use of iterative reconstruction technique. COMPARISON:  10/02/2020 FINDINGS: Brain: Unchanged atrophy. Progressive chronic small vessel ischemic change. There is a remote lacunar

## 2022-02-19 ENCOUNTER — Other Ambulatory Visit: Payer: Self-pay

## 2022-02-19 ENCOUNTER — Encounter (HOSPITAL_COMMUNITY): Payer: Self-pay | Admitting: *Deleted

## 2022-02-19 ENCOUNTER — Emergency Department (HOSPITAL_COMMUNITY): Payer: No Typology Code available for payment source

## 2022-02-19 ENCOUNTER — Emergency Department (HOSPITAL_COMMUNITY)
Admission: EM | Admit: 2022-02-19 | Discharge: 2022-02-20 | Disposition: A | Discharge status: Home / Self Care | Payer: No Typology Code available for payment source | Attending: Emergency Medicine | Admitting: Emergency Medicine

## 2022-02-19 DIAGNOSIS — Z20822 Contact with and (suspected) exposure to covid-19: Secondary | ICD-10-CM | POA: Insufficient documentation

## 2022-02-19 DIAGNOSIS — L539 Erythematous condition, unspecified: Secondary | ICD-10-CM | POA: Diagnosis not present

## 2022-02-19 DIAGNOSIS — R410 Disorientation, unspecified: Secondary | ICD-10-CM | POA: Diagnosis not present

## 2022-02-19 DIAGNOSIS — Z7901 Long term (current) use of anticoagulants: Secondary | ICD-10-CM | POA: Diagnosis not present

## 2022-02-19 DIAGNOSIS — E1122 Type 2 diabetes mellitus with diabetic chronic kidney disease: Secondary | ICD-10-CM | POA: Diagnosis not present

## 2022-02-19 DIAGNOSIS — N189 Chronic kidney disease, unspecified: Secondary | ICD-10-CM | POA: Insufficient documentation

## 2022-02-19 DIAGNOSIS — R531 Weakness: Secondary | ICD-10-CM | POA: Insufficient documentation

## 2022-02-19 DIAGNOSIS — J029 Acute pharyngitis, unspecified: Secondary | ICD-10-CM | POA: Diagnosis present

## 2022-02-19 DIAGNOSIS — R4182 Altered mental status, unspecified: Secondary | ICD-10-CM | POA: Insufficient documentation

## 2022-02-19 LAB — CBC WITH DIFFERENTIAL/PLATELET
Abs Immature Granulocytes: 0.02 10*3/uL (ref 0.00–0.07)
Basophils Absolute: 0 10*3/uL (ref 0.0–0.1)
Basophils Relative: 1 %
Eosinophils Absolute: 0.2 10*3/uL (ref 0.0–0.5)
Eosinophils Relative: 5 %
HCT: 37 % — ABNORMAL LOW (ref 39.0–52.0)
Hemoglobin: 11.4 g/dL — ABNORMAL LOW (ref 13.0–17.0)
Immature Granulocytes: 0 %
Lymphocytes Relative: 29 %
Lymphs Abs: 1.3 10*3/uL (ref 0.7–4.0)
MCH: 28.1 pg (ref 26.0–34.0)
MCHC: 30.8 g/dL (ref 30.0–36.0)
MCV: 91.4 fL (ref 80.0–100.0)
Monocytes Absolute: 0.5 10*3/uL (ref 0.1–1.0)
Monocytes Relative: 12 %
Neutro Abs: 2.4 10*3/uL (ref 1.7–7.7)
Neutrophils Relative %: 53 %
Platelets: 243 10*3/uL (ref 150–400)
RBC: 4.05 MIL/uL — ABNORMAL LOW (ref 4.22–5.81)
RDW: 13.8 % (ref 11.5–15.5)
WBC: 4.5 10*3/uL (ref 4.0–10.5)
nRBC: 0 % (ref 0.0–0.2)

## 2022-02-19 LAB — URINALYSIS, ROUTINE W REFLEX MICROSCOPIC
Bilirubin Urine: NEGATIVE
Glucose, UA: NEGATIVE mg/dL
Hgb urine dipstick: NEGATIVE
Ketones, ur: NEGATIVE mg/dL
Leukocytes,Ua: NEGATIVE
Nitrite: NEGATIVE
Protein, ur: 30 mg/dL — AB
Specific Gravity, Urine: 1.009 (ref 1.005–1.030)
pH: 6 (ref 5.0–8.0)

## 2022-02-19 LAB — COMPREHENSIVE METABOLIC PANEL
ALT: 13 U/L (ref 0–44)
AST: 15 U/L (ref 15–41)
Albumin: 3.5 g/dL (ref 3.5–5.0)
Alkaline Phosphatase: 61 U/L (ref 38–126)
Anion gap: 11 (ref 5–15)
BUN: 47 mg/dL — ABNORMAL HIGH (ref 8–23)
CO2: 24 mmol/L (ref 22–32)
Calcium: 9.3 mg/dL (ref 8.9–10.3)
Chloride: 101 mmol/L (ref 98–111)
Creatinine, Ser: 2.91 mg/dL — ABNORMAL HIGH (ref 0.61–1.24)
GFR, Estimated: 22 mL/min — ABNORMAL LOW (ref >60)
Glucose, Bld: 155 mg/dL — ABNORMAL HIGH (ref 70–99)
Potassium: 4.2 mmol/L (ref 3.5–5.1)
Sodium: 136 mmol/L (ref 135–145)
Total Bilirubin: 0.6 mg/dL (ref 0.3–1.2)
Total Protein: 6.9 g/dL (ref 6.5–8.1)

## 2022-02-19 LAB — TROPONIN I (HIGH SENSITIVITY)
Troponin I (High Sensitivity): 9 ng/L (ref <18)
Troponin I (High Sensitivity): 21 ng/L — ABNORMAL HIGH (ref <18)
Troponin I (High Sensitivity): 34 ng/L — ABNORMAL HIGH (ref <18)

## 2022-02-19 LAB — RESP PANEL BY RT-PCR (FLU A&B, COVID) ARPGX2
Influenza A by PCR: NEGATIVE
Influenza B by PCR: NEGATIVE
SARS Coronavirus 2 by RT PCR: NEGATIVE

## 2022-02-19 LAB — RAPID URINE DRUG SCREEN, HOSP PERFORMED
Amphetamines: NOT DETECTED
Barbiturates: NOT DETECTED
Benzodiazepines: NOT DETECTED
Cocaine: NOT DETECTED
Opiates: NOT DETECTED
Tetrahydrocannabinol: NOT DETECTED

## 2022-02-19 LAB — LACTIC ACID, PLASMA: Lactic Acid, Venous: 1.6 mmol/L (ref 0.5–1.9)

## 2022-02-19 LAB — ETHANOL: Alcohol, Ethyl (B): <10 mg/dL (ref <10)

## 2022-02-19 LAB — GROUP A STREP BY PCR: Group A Strep by PCR: NOT DETECTED

## 2022-02-19 NOTE — Discharge Instructions (Addendum)
You are seen in the emergency department for an episode of sore throat and possible confusion versus panic attack.  Your lab work was stable from your priors and your symptoms were improved.  Please follow-up with your regular care team.  Return to the emergency department if any worsening or concerning symptoms.

## 2022-02-19 NOTE — ED Triage Notes (Signed)
The pt arrived from home by [place] according to the paramedic  the pt has had some confusion today  not sure of the time  unsure if family is coming.  The pt is alert at present appears oriented.  He arrives with a  foley cath  and has a history of utis   no temp today

## 2022-02-19 NOTE — ED Notes (Signed)
Correction to the admitting note the pt has a condom cath not a foley cath as told by ems

## 2022-02-19 NOTE — ED Notes (Signed)
[facility] called for transport. 7th on list

## 2022-02-19 NOTE — ED Provider Notes (Signed)
[facility] EMERGENCY DEPARTMENT Provider Note   CSN: [id number] Arrival date & time: 02/19/22  1552     History  Chief Complaint  Patient presents with   Altered Mental Status    Tim Bradley is a 71 y.o. male.  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  Was recently admitted and sent to rehab for ESBL UTI.  He said he was in his normal state of health and took a nap earlier today.  When he woke up he had a severe sore throat and was panicked.  Lives with his sister who called EMS.  EMS states patient is here for altered mental status and sore throat.  Patient is alert and oriented.  He denies any headache chest pain shortness of breath abdominal pain vomiting diarrhea fevers.  He said he had a minor fall transferring from wheelchair to bed a few weeks ago and does not feel he was injured.  The history is provided by the patient and the EMS personnel.  Sore Throat This is a new problem. The current episode started 1 to 2 hours ago. The problem occurs constantly. The problem has not changed since onset.Pertinent negatives include no chest pain, no abdominal pain, no headaches and no shortness of breath. The symptoms are aggravated by swallowing. Nothing relieves the symptoms. He has tried nothing for the symptoms. The treatment provided no relief.      Home Medications Prior to Admission medications   Medication Sig Start Date End Date Taking? Authorizing Provider  acetaminophen (TYLENOL) 500 MG tablet Take 1,000 mg by mouth every 6 (six) hours as needed for moderate pain or headache.    [provider]  apixaban (ELIQUIS) 5 MG TABS tablet Take 1 tablet (5 mg total) by mouth 2 (two) times daily. Patient not taking: Reported on 01/24/2022 01/10/22   Horton, Alvin Critchley, DO  atorvastatin (LIPITOR) 80 MG tablet Take 1 tablet (80 mg total) by mouth daily. Patient not taking: Reported on  01/24/2022 01/10/22   Horton, Drue Dun M, DO  carvedilol (COREG) 6.25 MG tablet Take 1 tablet (6.25 mg total) by mouth 2 (two) times daily with a meal. 01/10/22   Horton, Alvin Critchley, DO  Cholecalciferol (QC VITAMIN D3) 50 MCG (2000 UT) TABS Take 2,000 Units by mouth daily.    [provider]  mometasone-formoterol (DULERA) 200-5 MCG/ACT AERO Inhale 2 puffs into the lungs 2 (two) times daily. 01/31/22 03/02/22  Darliss Cheney, MD  Naphazoline-Pheniramine (VISINE-A OP) Place 1 drop into both eyes daily as needed (dry/itchy eyes).    [provider]  tamsulosin (FLOMAX) 0.4 MG CAPS capsule Take 1 capsule (0.4 mg total) by mouth daily after supper. Patient not taking: Reported on 01/24/2022 01/27/20   Deno Etienne, DO  torsemide (DEMADEX) 20 MG tablet Take 1 tablet (20 mg total) by mouth at bedtime. 01/31/22 03/02/22  Darliss Cheney, MD  vitamin B-12 1000 MCG tablet Take 1 tablet (1,000 mcg total) by mouth daily. 02/01/22 03/03/22  Darliss Cheney, MD      Allergies    Trazodone and nefazodone    Review of Systems   Review of Systems  Constitutional:  Negative for fever.  HENT:  Positive for sore throat.   Eyes:  Negative for visual disturbance.  Respiratory:  Negative for shortness of breath.   Cardiovascular:  Negative for chest pain.  Gastrointestinal:  Negative for abdominal pain and vomiting.  Genitourinary:  Negative for  dysuria.  Musculoskeletal:  Positive for gait problem.  Skin:  Negative for rash.  Neurological:  Negative for headaches.   Physical Exam Updated Vital Signs BP (!) 142/102   Pulse 85   Temp 98.9 F (37.2 C)   Resp 20   Ht 6\' 2"  (1.88 m)   Wt 113.4 kg   SpO2 95%   BMI 32.10 kg/m  Physical Exam Vitals and nursing note reviewed.  Constitutional:      General: He is not in acute distress.    Appearance: Normal appearance. He is well-developed. He is obese.  HENT:     Head: Normocephalic and atraumatic.     Mouth/Throat:     Mouth: Mucous membranes are moist.      Pharynx: Oropharynx is clear. Posterior oropharyngeal erythema present. No oropharyngeal exudate.     Comments: Raspy voice no stridor Eyes:     Conjunctiva/sclera: Conjunctivae normal.  Cardiovascular:     Rate and Rhythm: Normal rate and regular rhythm.     Heart sounds: No murmur heard. Pulmonary:     Effort: Pulmonary effort is normal. No respiratory distress.     Breath sounds: Normal breath sounds. No wheezing or rhonchi.  Abdominal:     Palpations: Abdomen is soft.     Tenderness: There is no abdominal tenderness. There is no guarding or rebound.  Musculoskeletal:        General: No deformity.     Cervical back: Neck supple.  Skin:    General: Skin is warm and dry.     Capillary Refill: Capillary refill takes less than 2 seconds.  Neurological:     General: No focal deficit present.     Mental Status: He is alert.     Comments: Generalized weakness of lower extremities    ED Results / Procedures / Treatments   Labs (all labs ordered are listed, but only abnormal results are displayed) Labs Reviewed  COMPREHENSIVE METABOLIC PANEL - Abnormal; Notable for the following components:      Result Value   Glucose, Bld 155 (*)    BUN 47 (*)    Creatinine, Ser 2.91 (*)    GFR, Estimated 22 (*)    All other components within normal limits  CBC WITH DIFFERENTIAL/PLATELET - Abnormal; Notable for the following components:   RBC 4.05 (*)    Hemoglobin 11.4 (*)    HCT 37.0 (*)    All other components within normal limits  URINALYSIS, ROUTINE W REFLEX MICROSCOPIC - Abnormal; Notable for the following components:   Protein, ur 30 (*)    Bacteria, UA RARE (*)    All other components within normal limits  TROPONIN I (HIGH SENSITIVITY) - Abnormal; Notable for the following components:   Troponin I (High Sensitivity) 21 (*)    All other components within normal limits  TROPONIN I (HIGH SENSITIVITY) - Abnormal; Notable for the following components:   Troponin I (High Sensitivity)  34 (*)    All other components within normal limits  RESP PANEL BY RT-PCR (FLU A&B, COVID) [id number]  GROUP A STREP BY PCR  CULTURE, BLOOD (ROUTINE X 2)  CULTURE, BLOOD (ROUTINE X 2)  URINE CULTURE  ETHANOL  RAPID URINE DRUG SCREEN, HOSP PERFORMED  LACTIC ACID, PLASMA  TROPONIN I (HIGH SENSITIVITY)    EKG EKG Interpretation  Date/Time:  Wednesday Feb 19 2022 15:53:57 EDT Ventricular Rate:  87 PR Interval:  164 QRS Duration: 175 QT Interval:  447 QTC Calculation: 538 R Axis:   -  64 Text Interpretation: Sinus rhythm Probable left atrial enlargement Left bundle branch block Since last tracing rate slower 4/23 Confirmed by Aletta Edouard [id number] on 02/19/2022 4:02:28 PM  Radiology DG Chest Port 1 View  Result Date: 02/19/2022 CLINICAL DATA:  Altered mental status. EXAM: PORTABLE CHEST 1 VIEW COMPARISON:  01/24/2022 and prior radiographs FINDINGS: The cardiomediastinal silhouette is unremarkable. Mild elevation of the RIGHT hemidiaphragm again noted. There is no evidence of focal airspace disease, pulmonary edema, suspicious pulmonary nodule/mass, pleural effusion, or pneumothorax. No acute bony abnormalities are identified. IMPRESSION: No active disease. Electronically Signed   By: Margarette Canada M.D.   On: 02/19/2022 16:34    Procedures Procedures    Medications Ordered in ED Medications - No data to display  ED Course/ Medical Decision Making/ A&P Clinical Course as of 02/19/22 2318  Wed Feb 19, 2022  1637 Chest x-ray interpreted by me as cardiomegaly no gross infiltrates.  Awaiting radiology reading. [MB]  1756 Patient her sister is here now.  She confirms most of the history.  He was fine this morning took a nap rolled over on his belly.  He was calling her and struggling to get up and seeming very panicked may be confused.  She is not sure if he might of had a seizure.  She feels he is back to baseline now.  She is comfortable plan for checking some labs and possibly returning home  if no acute findings. [MB]    Clinical Course User Index [MB] Hayden Rasmussen, MD                           Medical Decision Making Amount and/or Complexity of Data Reviewed Labs: ordered. Radiology: ordered.  PERCY WINTERROWD was evaluated in Emergency Department on 02/19/2022 for the symptoms described in the history of present illness. He was evaluated in the context of the global COVID-19 pandemic, which necessitated consideration that the patient might be at risk for infection with the SARS-CoV-2 virus that causes COVID-19. Institutional protocols and algorithms that pertain to the evaluation of patients at risk for COVID-19 are in a state of rapid change based on information released by regulatory bodies including the CDC and federal and state organizations. These policies and algorithms were followed during the patient's care in the ED. This patient complains of sore throat, possible confusion; this involves an extensive number of treatment Options and is a complaint that carries with it a high risk of complications and morbidity. The differential includes sore throat, strep, aspiration, ACS, dehydration, metabolic derangement, COVID, flu, sepsis  I ordered, reviewed and interpreted labs, which included CBC with normal white count, hemoglobin low better than priors, chemistries with chronic CKD elevated glucose, urinalysis without signs of infection, strep and COVID and flu negative, alcohol negative, lactic acid not elevated, U tox negative, blood cultures and urine culture sent I ordered imaging studies which included chest x-ray and I independently    visualized and interpreted imaging which showed no acute findings Additional history obtained from EMS and patient's sister Previous records obtained and reviewed in epic including recent discharge summary Cardiac monitoring reviewed, normal sinus rhythm Social determinants considered, patient active with tobacco and has limited  transportation resources Critical Interventions: None  After the interventions stated above, I reevaluated the patient and found patient be back to baseline per his sister.  She is comfortable with him returning home.  Will need ambulance for return as he is not  ambulatory Admission and further testing considered, no indications for admission or further work-up at this time.  Patient and family comfortable with plan for return home.  Return instructions discussed          Final Clinical Impression(s) / ED Diagnoses Final diagnoses:  Sore throat  Confusion    Rx / DC Orders ED Discharge Orders     None         Hayden Rasmussen, MD 02/19/22 2321

## 2022-02-19 NOTE — ED Notes (Signed)
Pt still c/o a sorethroat  no distress

## 2022-02-20 ENCOUNTER — Encounter (HOSPITAL_COMMUNITY): Payer: Self-pay

## 2022-02-20 ENCOUNTER — Emergency Department (HOSPITAL_COMMUNITY): Payer: No Typology Code available for payment source

## 2022-02-20 ENCOUNTER — Observation Stay (HOSPITAL_COMMUNITY): Payer: No Typology Code available for payment source

## 2022-02-20 ENCOUNTER — Observation Stay (HOSPITAL_COMMUNITY)
Admission: EM | Admit: 2022-02-20 | Discharge: 2022-02-21 | Disposition: A | Discharge status: Home / Self Care | Payer: No Typology Code available for payment source | Attending: Internal Medicine | Admitting: Internal Medicine

## 2022-02-20 ENCOUNTER — Other Ambulatory Visit: Payer: Self-pay

## 2022-02-20 DIAGNOSIS — Z7901 Long term (current) use of anticoagulants: Secondary | ICD-10-CM | POA: Insufficient documentation

## 2022-02-20 DIAGNOSIS — R569 Unspecified convulsions: Secondary | ICD-10-CM | POA: Diagnosis present

## 2022-02-20 DIAGNOSIS — Z79899 Other long term (current) drug therapy: Secondary | ICD-10-CM | POA: Diagnosis not present

## 2022-02-20 DIAGNOSIS — E1122 Type 2 diabetes mellitus with diabetic chronic kidney disease: Secondary | ICD-10-CM | POA: Diagnosis not present

## 2022-02-20 DIAGNOSIS — R9431 Abnormal electrocardiogram [ECG] [EKG]: Secondary | ICD-10-CM | POA: Insufficient documentation

## 2022-02-20 DIAGNOSIS — I82401 Acute embolism and thrombosis of unspecified deep veins of right lower extremity: Secondary | ICD-10-CM | POA: Insufficient documentation

## 2022-02-20 DIAGNOSIS — G40909 Epilepsy, unspecified, not intractable, without status epilepticus: Secondary | ICD-10-CM | POA: Diagnosis not present

## 2022-02-20 DIAGNOSIS — R54 Age-related physical debility: Secondary | ICD-10-CM | POA: Insufficient documentation

## 2022-02-20 DIAGNOSIS — I10 Essential (primary) hypertension: Secondary | ICD-10-CM | POA: Diagnosis not present

## 2022-02-20 DIAGNOSIS — Z87891 Personal history of nicotine dependence: Secondary | ICD-10-CM | POA: Diagnosis not present

## 2022-02-20 DIAGNOSIS — E669 Obesity, unspecified: Secondary | ICD-10-CM | POA: Insufficient documentation

## 2022-02-20 DIAGNOSIS — N400 Enlarged prostate without lower urinary tract symptoms: Secondary | ICD-10-CM | POA: Insufficient documentation

## 2022-02-20 DIAGNOSIS — Z86718 Personal history of other venous thrombosis and embolism: Secondary | ICD-10-CM | POA: Diagnosis not present

## 2022-02-20 DIAGNOSIS — Z66 Do not resuscitate: Secondary | ICD-10-CM | POA: Diagnosis present

## 2022-02-20 DIAGNOSIS — I130 Hypertensive heart and chronic kidney disease with heart failure and stage 1 through stage 4 chronic kidney disease, or unspecified chronic kidney disease: Secondary | ICD-10-CM | POA: Diagnosis not present

## 2022-02-20 DIAGNOSIS — E66811 Obesity, class 1: Secondary | ICD-10-CM | POA: Diagnosis present

## 2022-02-20 DIAGNOSIS — N184 Chronic kidney disease, stage 4 (severe): Secondary | ICD-10-CM | POA: Insufficient documentation

## 2022-02-20 DIAGNOSIS — E785 Hyperlipidemia, unspecified: Secondary | ICD-10-CM | POA: Insufficient documentation

## 2022-02-20 DIAGNOSIS — G40919 Epilepsy, unspecified, intractable, without status epilepticus: Secondary | ICD-10-CM | POA: Diagnosis not present

## 2022-02-20 DIAGNOSIS — I825Y1 Chronic embolism and thrombosis of unspecified deep veins of right proximal lower extremity: Secondary | ICD-10-CM

## 2022-02-20 DIAGNOSIS — I5022 Chronic systolic (congestive) heart failure: Secondary | ICD-10-CM | POA: Diagnosis not present

## 2022-02-20 DIAGNOSIS — E1169 Type 2 diabetes mellitus with other specified complication: Secondary | ICD-10-CM | POA: Diagnosis not present

## 2022-02-20 DIAGNOSIS — Z6832 Body mass index (BMI) 32.0-32.9, adult: Secondary | ICD-10-CM | POA: Diagnosis not present

## 2022-02-20 DIAGNOSIS — I82409 Acute embolism and thrombosis of unspecified deep veins of unspecified lower extremity: Secondary | ICD-10-CM | POA: Diagnosis present

## 2022-02-20 LAB — COMPREHENSIVE METABOLIC PANEL
ALT: 13 U/L (ref 0–44)
AST: 13 U/L — ABNORMAL LOW (ref 15–41)
Albumin: 3.4 g/dL — ABNORMAL LOW (ref 3.5–5.0)
Alkaline Phosphatase: 64 U/L (ref 38–126)
Anion gap: 8 (ref 5–15)
BUN: 44 mg/dL — ABNORMAL HIGH (ref 8–23)
CO2: 26 mmol/L (ref 22–32)
Calcium: 9.7 mg/dL (ref 8.9–10.3)
Chloride: 104 mmol/L (ref 98–111)
Creatinine, Ser: 2.83 mg/dL — ABNORMAL HIGH (ref 0.61–1.24)
GFR, Estimated: 23 mL/min — ABNORMAL LOW (ref >60)
Glucose, Bld: 150 mg/dL — ABNORMAL HIGH (ref 70–99)
Potassium: 4.2 mmol/L (ref 3.5–5.1)
Sodium: 138 mmol/L (ref 135–145)
Total Bilirubin: 1 mg/dL (ref 0.3–1.2)
Total Protein: 7 g/dL (ref 6.5–8.1)

## 2022-02-20 LAB — CBC WITH DIFFERENTIAL/PLATELET
Abs Immature Granulocytes: 0.01 10*3/uL (ref 0.00–0.07)
Basophils Absolute: 0 10*3/uL (ref 0.0–0.1)
Basophils Relative: 1 %
Eosinophils Absolute: 0.1 10*3/uL (ref 0.0–0.5)
Eosinophils Relative: 3 %
HCT: 37.4 % — ABNORMAL LOW (ref 39.0–52.0)
Hemoglobin: 11.5 g/dL — ABNORMAL LOW (ref 13.0–17.0)
Immature Granulocytes: 0 %
Lymphocytes Relative: 26 %
Lymphs Abs: 1.1 10*3/uL (ref 0.7–4.0)
MCH: 27.9 pg (ref 26.0–34.0)
MCHC: 30.7 g/dL (ref 30.0–36.0)
MCV: 90.8 fL (ref 80.0–100.0)
Monocytes Absolute: 0.5 10*3/uL (ref 0.1–1.0)
Monocytes Relative: 12 %
Neutro Abs: 2.4 10*3/uL (ref 1.7–7.7)
Neutrophils Relative %: 58 %
Platelets: 223 10*3/uL (ref 150–400)
RBC: 4.12 MIL/uL — ABNORMAL LOW (ref 4.22–5.81)
RDW: 14 % (ref 11.5–15.5)
WBC: 4.1 10*3/uL (ref 4.0–10.5)
nRBC: 0 % (ref 0.0–0.2)

## 2022-02-20 LAB — TSH: TSH: 0.801 u[IU]/mL (ref 0.350–4.500)

## 2022-02-20 LAB — ETHANOL: Alcohol, Ethyl (B): <10 mg/dL (ref <10)

## 2022-02-20 LAB — URINE CULTURE

## 2022-02-20 LAB — AMMONIA: Ammonia: 23 umol/L (ref 9–35)

## 2022-02-20 MED ORDER — LEVETIRACETAM IN NACL 1000 MG/100ML IV SOLN
1000.0000 mg | Freq: Once | INTRAVENOUS | Status: AC
  Administered 2022-02-20: 1000 mg via INTRAVENOUS
  Filled 2022-02-20: qty 100

## 2022-02-20 MED ORDER — ACETAMINOPHEN 650 MG RE SUPP: 650.0000 mg | RECTAL | Status: DC | PRN

## 2022-02-20 MED ORDER — LORAZEPAM 2 MG/ML IJ SOLN: 4.0000 mg | INTRAMUSCULAR | Status: DC | PRN

## 2022-02-20 MED ORDER — SODIUM CHLORIDE 0.9 % IV BOLUS: 500.0000 mL | Freq: Once | INTRAVENOUS | Status: DC

## 2022-02-20 MED ORDER — APIXABAN 5 MG PO TABS
5.0000 mg | ORAL_TABLET | Freq: Two times a day (BID) | ORAL | Status: DC
  Administered 2022-02-20 – 2022-02-21 (×2): 5 mg via ORAL
  Filled 2022-02-20 (×2): qty 1

## 2022-02-20 MED ORDER — ONDANSETRON HCL 4 MG PO TABS: 4.0000 mg | ORAL_TABLET | Freq: Four times a day (QID) | ORAL | Status: DC | PRN

## 2022-02-20 MED ORDER — CARVEDILOL 6.25 MG PO TABS
6.2500 mg | ORAL_TABLET | Freq: Two times a day (BID) | ORAL | Status: DC
  Administered 2022-02-20 – 2022-02-21 (×2): 6.25 mg via ORAL
  Filled 2022-02-20: qty 1
  Filled 2022-02-20: qty 2

## 2022-02-20 MED ORDER — MOMETASONE FURO-FORMOTEROL FUM 200-5 MCG/ACT IN AERO
2.0000 | INHALATION_SPRAY | Freq: Two times a day (BID) | RESPIRATORY_TRACT | Status: DC
  Filled 2022-02-20 (×2): qty 8.8

## 2022-02-20 MED ORDER — LORAZEPAM 2 MG/ML IJ SOLN
INTRAMUSCULAR | Status: AC
  Administered 2022-02-20: 2 mg via INTRAMUSCULAR
  Filled 2022-02-20: qty 1

## 2022-02-20 MED ORDER — LORAZEPAM 2 MG/ML IJ SOLN
2.0000 mg | Freq: Once | INTRAMUSCULAR | Status: AC
Start: 2022-02-20 — End: 2022-02-20

## 2022-02-20 MED ORDER — SODIUM CHLORIDE 0.9% FLUSH
3.0000 mL | Freq: Two times a day (BID) | INTRAVENOUS | Status: DC
  Administered 2022-02-20 – 2022-02-21 (×2): 3 mL via INTRAVENOUS

## 2022-02-20 MED ORDER — ORAL CARE MOUTH RINSE: 15.0000 mL | OROMUCOSAL | Status: DC

## 2022-02-20 MED ORDER — ACETAMINOPHEN 325 MG PO TABS: 650.0000 mg | ORAL_TABLET | ORAL | Status: DC | PRN

## 2022-02-20 MED ORDER — ONDANSETRON HCL 4 MG/2ML IJ SOLN: 4.0000 mg | Freq: Four times a day (QID) | INTRAMUSCULAR | Status: DC | PRN

## 2022-02-20 MED ORDER — CHLORHEXIDINE GLUCONATE 0.12% ORAL RINSE (MEDLINE KIT): 15.0000 mL | Freq: Two times a day (BID) | OROMUCOSAL | Status: DC

## 2022-02-20 MED ORDER — ATORVASTATIN CALCIUM 80 MG PO TABS
80.0000 mg | ORAL_TABLET | Freq: Every day | ORAL | Status: DC
  Administered 2022-02-21: 80 mg via ORAL
  Filled 2022-02-20: qty 1

## 2022-02-20 MED ORDER — HYDRALAZINE HCL 20 MG/ML IJ SOLN
5.0000 mg | INTRAMUSCULAR | Status: DC | PRN
Start: 2022-02-20 — End: 2022-02-21

## 2022-02-20 NOTE — ED Notes (Signed)
PT PHONE LEFT HERE. CALLED PT SISTER AND LET HER KNOW ITS AT THE FRONT DESK IN LOBBY

## 2022-02-20 NOTE — Plan of Care (Signed)
  Problem: Safety: Goal: Verbalization of understanding the information provided will improve Outcome: Progressing

## 2022-02-20 NOTE — ED Triage Notes (Signed)
"  I was watching tv, not sure if I fell asleep or what happened, next thing I knew the paramedics were coming in the house" per pt

## 2022-02-20 NOTE — ED Notes (Signed)
Received verbal report from Tiffany S RN at this time 

## 2022-02-20 NOTE — ED Provider Notes (Signed)
Emergency Department Provider Note   I have reviewed the triage vital signs and the nursing notes.   HISTORY  Chief Complaint Altered Mental Status   HPI Tim Bradley is a 71 y.o. male past medical history reviewed below returns to the emergency department with "staring" episode this morning while watching TV. He does have a history of seizure. Patient unsure exactly what happened but tells me he was watching TV when he suddenly saw his sister standing over him asking if he was OK. Shortly afterwards, EMS arrived and transported him back to the hospital. Notes he is feeling well now. He was in the ED last night with labs at that time but was ultimately sent home with return of symptoms today.   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 ENT: No sore throat. Cardiovascular: Denies chest pain. Respiratory: Denies shortness of breath. Gastrointestinal: No abdominal pain.  No nausea, no vomiting.  No diarrhea.  No constipation. Genitourinary: Negative for dysuria. Musculoskeletal: Negative for back pain. Skin: Negative for rash. Neurological: Negative for headaches, focal weakness or numbness. Positive episode of AMS.   ____________________________________________   PHYSICAL EXAM:  VITAL SIGNS: Vitals:   02/20/22 1415 02/20/22 1430  BP: (!) 193/97 (!) 187/82  Pulse: 68 61  Resp: 15 20  SpO2: 96% 94%      Constitutional: Alert and oriented. Well appearing and in no acute distress. Eyes: Conjunctivae are normal. PERRL.  Head: Atraumatic. Nose: No congestion/rhinnorhea. Mouth/Throat: Mucous membranes are moist.  Neck: No stridor.   Cardiovascular: Normal rate, regular rhythm. Good peripheral circulation. Grossly normal heart sounds.   Respiratory: Normal respiratory effort.  No retractions. Lungs CTAB. Gastrointestinal: Soft and nontender. No distention.  Musculoskeletal: No lower extremity tenderness nor edema. No gross deformities of extremities. Neurologic:  Normal speech and language. No gross focal neurologic deficits are appreciated.  Skin:  Skin is warm, dry and intact. No rash noted.   ____________________________________________   LABS (all labs ordered are listed, but only abnormal results are displayed)  Labs Reviewed  COMPREHENSIVE METABOLIC PANEL - Abnormal; Notable for the following components:      Result Value   Glucose, Bld 150 (*)    BUN 44 (*)    Creatinine, Ser 2.83 (*)    Albumin 3.4 (*)    AST 13 (*)    GFR, Estimated 23 (*)    All other components within normal limits  CBC WITH DIFFERENTIAL/PLATELET - Abnormal; Notable for the following components:   RBC 4.12 (*)    Hemoglobin 11.5 (*)    HCT 37.4 (*)    All other components within normal limits  AMMONIA  TSH  ETHANOL   ____________________________________________  EKG   EKG Interpretation  Date/Time:  Thursday Feb 20 2022 13:10:32 EDT Ventricular Rate:  64 PR Interval:  161 QRS Duration: 168 QT Interval:  475 QTC Calculation: 491 R Axis:   -39 Text Interpretation: Sinus rhythm Left bundle branch block Confirmed by Nanda Quinton [phone]) on 02/20/2022 1:13:09 PM        ____________________________________________  RADIOLOGY  CT Head Wo Contrast  Result Date: 02/20/2022 CLINICAL DATA:  Mental status change. EXAM: CT HEAD WITHOUT CONTRAST TECHNIQUE: Contiguous axial images were  obtained from the base of the skull through the vertex without intravenous contrast. RADIATION DOSE REDUCTION: This exam was performed according to the departmental dose-optimization program which includes automated exposure control, adjustment of the mA and/or kV according to patient size and/or use of iterative reconstruction technique. COMPARISON:  Head CT dated January 24, 2022 FINDINGS: Brain: Chronic white matter ischemic change. No evidence of acute infarction, hemorrhage, hydrocephalus, extra-axial collection or mass lesion/mass effect. Vascular: No hyperdense vessel or unexpected calcification. Skull: Normal. Negative for fracture or focal lesion. Sinuses/Orbits: No acute finding. Other: None. IMPRESSION: No acute intracranial abnormality. Electronically Signed   By: Yetta Glassman M.D.   On: 02/20/2022 13:02   DG Chest Port 1 View  Result Date: 02/19/2022 CLINICAL DATA:  Altered mental status. EXAM: PORTABLE CHEST 1 VIEW COMPARISON:  01/24/2022 and prior radiographs FINDINGS: The cardiomediastinal silhouette is unremarkable. Mild elevation of the RIGHT hemidiaphragm again noted. There is no evidence of focal airspace disease, pulmonary edema, suspicious pulmonary nodule/mass, pleural effusion, or pneumothorax. No acute bony abnormalities are identified. IMPRESSION: No active disease. Electronically Signed   By: Margarette Canada M.D.   On: 02/19/2022 16:34    ____________________________________________   PROCEDURES  Procedure(s) performed:   .Critical Care Performed by: Margette Fast, MD Authorized by: Margette Fast, MD   Critical care provider statement:    Critical care time (minutes):  35   Critical care time was exclusive of:  Separately billable procedures and treating other patients and teaching time   Critical care was necessary to treat or prevent imminent or life-threatening deterioration of the following conditions:  CNS failure or compromise   Critical care was time spent  personally by me on the following activities:  Blood draw for specimens, development of treatment plan with patient or surrogate, discussions with consultants, examination of patient, obtaining history from patient or surrogate, ordering and performing treatments and interventions, ordering and review of laboratory studies, ordering and review of radiographic studies, re-evaluation of patient's condition, evaluation of patient's response to treatment and review of old charts   I assumed direction of critical care for this patient from another provider in my specialty: no     Care discussed with: admitting provider    ____________________________________   INITIAL IMPRESSION / [place] / ED COURSE  Pertinent labs & imaging results that were available during my care of the patient were reviewed by me and considered in my medical decision making (see chart for details).   This patient is Presenting for Evaluation of AMS, which does require a range of treatment options, and is a complaint that involves a high risk of morbidity and mortality.  The Differential Diagnoses includes but is not exclusive to alcohol, illicit or prescription medications, intracranial pathology such as stroke, intracerebral hemorrhage, fever or infectious causes including sepsis, hypoxemia, uremia, trauma, endocrine related disorders such as diabetes, hypoglycemia, thyroid-related diseases, etc.   Critical Interventions-    Medications  sodium chloride 0.9 % bolus 500 mL (500 mLs Intravenous Patient Refused/Not Given 02/20/22 1230)  levETIRAcetam (KEPPRA) IVPB 1000  mg/100 mL premix (has no administration in time range)  LORazepam (ATIVAN) injection 2 mg (2 mg Intramuscular Given 02/20/22 1448)    Reassessment after intervention: Patient with good response to Ativan. No additional seizure activity. Patient awake and alert with brief post-ictal phase.    I did obtain Additional Historical Information from sister  at bedside.  I decided to review pertinent External Data, and in summary patient discharged from the ED last night with labs, UA, and U tox at that time.  [facility] of this [facility] describe the patient on Keppra 500 mg BID and Depakote 500 mg BID.    Clinical Laboratory Tests Ordered, included CBC without leukocytosis.  Mild anemia at 11.5.  Alcohol normal.  Ammonia negative.  Chronic kidney disease at 2.83 near his baseline.  Radiologic Tests Ordered, included CT head. I independently interpreted the images and agree with radiology interpretation.   Cardiac Monitor Tracing which shows NSR.    Social Determinants of Health Risk patient with a prior smoking history.   Consult complete with  Neurology - Dr. Theda Sers. He will consult. With 3rd episode in 24 hours agrees with admit for monitoring/med adjustment.   Hospitalist - Dr. Lorin Mercy. Plan for admit.   Spoke with Hospitalist at [place] in [place]. With 3rd seizure in 24 hours they do not have the capacity to manage patient in that state and would have to transfer our to a tertiary center. Transfer is for patient preference only. I discussed that we do have capacity here to care for the patient and will keep him here but appreciate the call back.   Medical Decision Making: Summary:  Patient presents emergency department with episode of altered mental status again this morning.  Differential includes breakthrough seizure versus metabolic issue.  No findings on exam to strongly suspect stroke or intracerebral hemorrhage.   02:50 PM  Called to bedside with patient having a generalized tonic/clonic seizure. 2mg  of IM ativan administered. Patient post-ictal but responding. Continue supportive care.   Reevaluation with update and discussion with patient and daughter. [facility] reach [facility] to discuss [facility] but not calling back. Will load with Keppra 1000 mg and speak with our Neurologist here. With continued breakthrough  seizure, including one here, I am concerned about a 45+ min ground transfer to [facility] hospital.   Disposition: admit  ____________________________________________  FINAL CLINICAL IMPRESSION(S) / ED DIAGNOSES  Final diagnoses:  Breakthrough seizure ([place])    Note:  This document was prepared using Dragon voice recognition software and may include unintentional dictation errors.  Nanda Quinton, MD, [facility] Emergency Medicine    Briana Newman, Wonda Olds, MD 02/20/22 [phone]

## 2022-02-20 NOTE — ED Triage Notes (Signed)
"  From home, family said she thought he had a seizure, described as having a blank stare and calling out her name, patient is alert and oriented on our arrival, he was seen here yesterday for AMS. His sister that called [place] wants him sent to the [facility] hospital. She was adamant that we not start an IV on him. Sister should be on her way over" per EMS

## 2022-02-20 NOTE — H&P (Signed)
History and Physical    Patient: Tim Bradley HYW:[id number] DOB: May 20, 1951 DOA: 02/20/2022 DOS: the patient was seen and examined on 02/20/2022 PCP: [place]  Patient coming from: SNF from last hospitalization; previously lived with sister; NOK: Grace Isaac, [phone]   Chief Complaint: Seizure  HPI: Tim Bradley is a 71 y.o. male with medical history significant of ETOH/cocaine dependence; chronic systolic CHF; stage 4 CKD; DM; HTN; and seizures presenting with seizure activity.  He was last admitted from 4/28-5/5 for sepsis from ESBL E coli UTI with bacteremia; urine culture also grew Morganella morganii.  Patient has ben having concern for seizure-like activity at least monthly but had an episode last night and today per his sister.  Today's episode was GTC.  He has mostly returned to baseline but his sister does not think he is fully stabilized yet.  Per Dr. 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  He was previously on Keppra and Depakote but has not been taking any medications for seizures recently.    ER Course:  Known seizure d/o with staring spells, had a GTC seizure here.  Has not been seen by neuro here.  Loaded with Keppra, d/w neurology.  ?supposed to be on Keppra, Depakote.  3 episodes in 24 hours.  [facility] does not feel comfortable [facility] at this time.     Review of Systems: As mentioned in the history of present illness. All other systems reviewed and are negative. Past Medical History:  Diagnosis Date   Abscess 02/2017   LEFT GLUTEAL    Acid reflux    Alcohol abuse    Anemia    CHF (congestive heart failure) (HCC)    CKD (chronic kidney disease) stage 4, GFR 15-29 ml/min (HCC) 04/11/2020   Cocaine abuse ([place])    DDD (degenerative disc disease), lumbar    Diabetes mellitus without complication ([place])    Dyspnea    Enlarged  prostate    Hypertension    Seizures ([place])    Spinal stenosis, lumbar    Past Surgical History:  Procedure Laterality Date   BACK SURGERY     cervical x2   BIOPSY  08/21/2018   Procedure: BIOPSY;  Surgeon: Ronald Lobo, MD;  Location: [address];  Service: Endoscopy;;   ESOPHAGOGASTRODUODENOSCOPY (EGD) WITH PROPOFOL N/A 08/21/2018   Procedure: ESOPHAGOGASTRODUODENOSCOPY (EGD) WITH PROPOFOL;  Surgeon: Ronald Lobo, MD;  Location: [facility];  Service: Endoscopy;  Laterality: N/A;   INCISION AND DRAINAGE PERIRECTAL ABSCESS N/A 03/26/2017   Procedure: IRRIGATION AND DEBRIDEMENT PERIRECTAL ABSCESS;  Surgeon: Rolm Bookbinder, MD;  Location: [facility];  Service: General;  Laterality: N/A;   Social History:  reports that he has quit smoking. His smoking use included cigarettes. He has a 10.00 pack-year smoking history. He has never used smokeless tobacco. He reports that he does not currently use alcohol after a past usage of about 12.0 standard drinks per week. He reports that he does not currently use drugs after having used the following drugs: Cocaine and Marijuana.  Allergies  Allergen Reactions   Trazodone And Nefazodone     Family reports seizures from this medication    Family History  Problem Relation Age of Onset   Hypertension Mother    Cancer - Lung Father     Prior to Admission medications   Medication Sig Start Date End Date  Taking? Authorizing Provider  acetaminophen (TYLENOL) 500 MG tablet Take 1,000 mg by mouth every 6 (six) hours as needed for moderate pain or headache.    [provider]  apixaban (ELIQUIS) 5 MG TABS tablet Take 1 tablet (5 mg total) by mouth 2 (two) times daily. Patient not taking: Reported on 01/24/2022 01/10/22   Horton, Alvin Critchley, DO  atorvastatin (LIPITOR) 80 MG tablet Take 1 tablet (80 mg total) by mouth daily. Patient not taking: Reported on 01/24/2022 01/10/22   Horton, Drue Dun M, DO  carvedilol (COREG) 6.25 MG tablet Take 1  tablet (6.25 mg total) by mouth 2 (two) times daily with a meal. 01/10/22   Horton, Alvin Critchley, DO  Cholecalciferol (QC VITAMIN D3) 50 MCG (2000 UT) TABS Take 2,000 Units by mouth daily.    [provider]  mometasone-formoterol (DULERA) 200-5 MCG/ACT AERO Inhale 2 puffs into the lungs 2 (two) times daily. 01/31/22 03/02/22  Darliss Cheney, MD  Naphazoline-Pheniramine (VISINE-A OP) Place 1 drop into both eyes daily as needed (dry/itchy eyes).    [provider]  tamsulosin (FLOMAX) 0.4 MG CAPS capsule Take 1 capsule (0.4 mg total) by mouth daily after supper. Patient not taking: Reported on 01/24/2022 01/27/20   Deno Etienne, DO  torsemide (DEMADEX) 20 MG tablet Take 1 tablet (20 mg total) by mouth at bedtime. 01/31/22 03/02/22  Darliss Cheney, MD  vitamin B-12 1000 MCG tablet Take 1 tablet (1,000 mcg total) by mouth daily. 02/01/22 03/03/22  Darliss Cheney, MD    Physical Exam: Vitals:   02/20/22 1430 02/20/22 1500 02/20/22 1530 02/20/22 1600  BP: (!) 187/82 (!) 183/106 (!) 173/99 (!) 168/98  Pulse: 61 80 78 81  Resp: 20 (!) 22 (!) 22 18  Temp:   97.8 F (36.6 C) 97.8 F (36.6 C)  TempSrc:    Oral  SpO2: 94% 98% 98% 98%  Weight:      Height:       General:  Appears calm and comfortable and is in NAD Eyes:  PERRL, EOMI, normal lids, iris ENT:  grossly normal hearing, lips & tongue, mmm; absent dentition Neck:  no LAD, masses or thyromegaly Cardiovascular:  RRR, no m/r/g. No LE edema.  Respiratory:   CTA bilaterally with no wheezes/rales/rhonchi.  Normal respiratory effort. Abdomen:  soft, NT, ND Skin:  no rash or induration seen on limited exam Musculoskeletal:  grossly normal tone BUE/BLE, good ROM, no bony abnormality Psychiatric:  blunted mood and affect, speech fluent and appropriate, AOx3 Neurologic:  CN 2-12 grossly intact, moves all extremities in coordinated fashion   Radiological Exams on Admission: Independently reviewed - see discussion in A/P where applicable  CT Head  Wo Contrast  Result Date: 02/20/2022 CLINICAL DATA:  Mental status change. EXAM: CT HEAD WITHOUT CONTRAST TECHNIQUE: Contiguous axial images were obtained from the base of the skull through the vertex without intravenous contrast. RADIATION DOSE REDUCTION: This exam was performed according to the departmental dose-optimization program which includes automated exposure control, adjustment of the mA and/or kV according to patient size and/or use of iterative reconstruction technique. COMPARISON:  Head CT dated January 24, 2022 FINDINGS: Brain: Chronic white matter ischemic change. No evidence of acute infarction, hemorrhage, hydrocephalus, extra-axial collection or mass lesion/mass effect. Vascular: No hyperdense vessel or unexpected calcification. Skull: Normal. Negative for fracture or focal lesion. Sinuses/Orbits: No acute finding. Other: None. IMPRESSION: No acute intracranial abnormality. Electronically Signed   By: Yetta Glassman M.D.   On: 02/20/2022 13:02   DG  Chest Port 1 View  Result Date: 02/19/2022 CLINICAL DATA:  Altered mental status. EXAM: PORTABLE CHEST 1 VIEW COMPARISON:  01/24/2022 and prior radiographs FINDINGS: The cardiomediastinal silhouette is unremarkable. Mild elevation of the RIGHT hemidiaphragm again noted. There is no evidence of focal airspace disease, pulmonary edema, suspicious pulmonary nodule/mass, pleural effusion, or pneumothorax. No acute bony abnormalities are identified. IMPRESSION: No active disease. Electronically Signed   By: Margarette Canada M.D.   On: 02/19/2022 16:34    EKG: Independently reviewed.  NSR with rate 64; LBBB, old   Labs on Admission: I have personally reviewed the available labs and imaging studies at the time of the admission.  Pertinent labs:    Glucose 150 BUN 44/Creatinine 2.83/GFR 23 - stable WBC 4.1 Hgb 11.5 5/24: HS troponin 21, 34 Lactate 1.6 COVID/flu negative UA: 30 protein ETOH <10 UDS negative Urine culture  contaminated   Assessment and Plan: Principal Problem:   Recurrent seizures ([place]) Active Problems:   Chronic systolic CHF (congestive heart failure) (HCC)   Essential hypertension   Type 2 diabetes mellitus with hyperlipidemia (HCC)   DVT (deep venous thrombosis) (HCC)   CKD (chronic kidney disease) stage 4, GFR 15-29 ml/min (HCC)   Class 1 obesity   DNR (do not resuscitate)    Seizures -Patient with known h/o seizures presenting with recent increase in breakthrough seizures and reported multiple seizures overnight -There is some question of previous nonorganic etiology but neurology is concerned for epilepsy now based on h/o SDH -There is also medication noncompliance -Patient placed in observation overnight for further evaluation -Neurology has seen the patient -Loaded with Keppra and he is likely to dc on 750 mg PO BID -Will order EEG  -He also will need driving restriction for at least 6 months -Seizure precautions -Ativan prn  Chronic systolic heart failure -Patient with known h/o chronic combined CHF (EF 35% last month, prior h/o grade 1 diastolic dysfunction)  -No apparent decompensation at this time -CXR does not show pulmonary edema   HTN -Takes low-dose Coreg monotherapy at home, will continue   HLD -resume Lipitor (patient not taking as outpatient)   Type 2 diabetes mellitus ([place]) -A1c is 7.0 -He is diet controlled currently -No meds ordered   DVT (deep venous thrombosis) ([place]) -Reported h/o DVT -He is on Eliquis, presumably for this issue -Recent RLE DVT US ordered and shows extensive clot burden, likely chronic -d/w Dr. Virl Cagey, no vascular intervention is indicated since he is minimally ambulatory at baseline -He was transitioned to Eliquis but has been refusing it - will resume -May need hematology consult   QT prolongation -Will attempt to avoid/minimize QT-prolonging medications such as PPI, nausea meds, SSRIs -Repeat EKG in AM   CKD (chronic  kidney disease) stage 4, GFR 15-29 ml/min (HCC) -Stable CKD -Will follow   BPH (benign prostatic hyperplasia) -No longer taking Flomax, Proscar  Obesity -Body mass index is 32.1 kg/m..  -Weight loss should be encouraged -Outpatient PCP/bariatric medicine f/u encouraged   DNR (do not resuscitate) -Continue DNR     Advance Care Planning:   Code Status: DNR   Consults: Neurology  DVT Prophylaxis: Eliquis  Family Communication: Sister was present throughout evaluation  Severity of Illness: The appropriate patient status for this patient is OBSERVATION. Observation status is judged to be reasonable and necessary in order to provide the required intensity of service to ensure the patient's safety. The patient's presenting symptoms, physical exam findings, and initial radiographic and laboratory data in the context  of their medical condition is felt to place them at decreased risk for further clinical deterioration. Furthermore, it is anticipated that the patient will be medically stable for discharge from the hospital within 2 midnights of admission.   Author: Karmen Bongo, MD 02/20/2022 5:35 PM  For on call review [email or web address].

## 2022-02-20 NOTE — Consult Note (Signed)
Neurology Consult H&P  Tim Bradley MR# [id number] 02/20/2022   CC: seizure  History is obtained from: sister and patient and chart.  HPI: Tim Bradley is a 71 y.o. male PMHx as reviewed below with witnessed seizure.  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   The following information was taken from Dr. Duaine Dredge not on 02/20/2022:  "male with medical history significant of ETOH/cocaine dependence; chronic systolic CHF; stage 4 CKD; DM; HTN; and seizures presenting with seizure activity.  He was last admitted from 4/28-5/5 for sepsis from ESBL E coli UTI with bacteremia; urine culture also grew Morganella morganii.  Patient has ben having concern for seizure-like activity at least monthly but had an episode last night and today per his sister.  Today's episode was GTC.  He has mostly returned to baseline but his sister does not think he is fully stabilized yet.  Per Dr. 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  He was previously on Keppra and Depakote but has not been taking any medications for seizures recently.  ER Course:  Known seizure d/o with staring spells, had a GTC seizure here.  Has not been seen by neuro here.  Loaded with Keppra, d/w neurology.  ?supposed to be on Keppra, Depakote.  3 episodes in 24 hours.  [facility] not [facility] at this time. "   ROS: A complete ROS was performed and is negative except as noted in the HPI.   Past Medical History:  Diagnosis Date   Abscess 02/2017   LEFT GLUTEAL    Acid reflux    Alcohol abuse    Anemia    CHF (congestive heart failure) (HCC)    CKD (chronic kidney disease) stage 4, GFR 15-29 ml/min (HCC) 04/11/2020   Cocaine abuse (HCC)    DDD (degenerative disc disease), lumbar     Diabetes mellitus without complication (HCC)    Dyspnea    Enlarged prostate    Hypertension    Seizures (HCC)    Spinal stenosis, lumbar      Family History  Problem Relation Age of Onset   Hypertension Mother    Cancer - Lung Father     Social History:  reports that he has quit smoking. His smoking use included cigarettes. He has a 10.00 pack-year smoking history. He has never used smokeless tobacco. He reports that he does not currently use alcohol after a past usage of about 12.0 standard drinks per week. He reports that he does not currently use drugs after having used the following drugs: Cocaine and Marijuana.   Prior to Admission medications   Medication Sig Start Date End Date Taking? Authorizing Provider  carvedilol (COREG) 6.25 MG tablet Take 1 tablet (6.25 mg total) by mouth 2 (two) times daily with a meal. 01/10/22  Yes Horton, Kristie M, DO  Cholecalciferol (QC VITAMIN D3) 50 MCG (2000 UT) TABS Take 2,000 Units by mouth daily.   Yes [provider]  Magnesium Oxide (MAG-OXIDE) 200 MG TABS Take 200 mg by mouth daily.   Yes [provider]  mometasone-formoterol (DULERA) 200-5 MCG/ACT AERO Inhale 2 puffs into the lungs 2 (two) times daily. 01/31/22 03/02/22 Yes Darliss Cheney, MD  torsemide (  DEMADEX) 100 MG tablet Take 100 mg by mouth daily.   Yes [provider]  acetaminophen (TYLENOL) 500 MG tablet Take 1,000 mg by mouth every 6 (six) hours as needed for moderate pain or headache. Patient not taking: Reported on 02/20/2022    [provider]  apixaban (ELIQUIS) 5 MG TABS tablet Take 1 tablet (5 mg total) by mouth 2 (two) times daily. Patient not taking: Reported on 01/24/2022 01/10/22   Horton, Alvin Critchley, DO  atorvastatin (LIPITOR) 80 MG tablet Take 1 tablet (80 mg total) by mouth daily. Patient not taking: Reported on 01/24/2022 01/10/22   Horton, Alvin Critchley, DO  Naphazoline-Pheniramine (VISINE-A OP) Place 1 drop into both eyes daily as needed  (dry/itchy eyes). Patient not taking: Reported on 02/20/2022    [provider]  torsemide (DEMADEX) 20 MG tablet Take 1 tablet (20 mg total) by mouth at bedtime. Patient not taking: Reported on 02/20/2022 01/31/22 03/02/22  Darliss Cheney, MD  vitamin B-12 1000 MCG tablet Take 1 tablet (1,000 mcg total) by mouth daily. Patient not taking: Reported on 02/20/2022 02/01/22 03/03/22  Darliss Cheney, MD    Exam: Current vital signs: BP (!) 168/98   Pulse 81   Temp 97.8 F (36.6 C) (Oral)   Resp 18   Ht 6\' 2"  (1.88 m)   Wt 113.4 kg   SpO2 98%   BMI 32.10 kg/m   Physical Exam  Constitutional: Appears well-developed and well-nourished.  Psych: Affect appropriate to situation Eyes: No scleral injection HENT: No OP obstruction. Head: Normocephalic.  Cardiovascular: Normal rate and regular rhythm.  Respiratory: Effort normal, symmetric excursions bilaterally, no audible wheezing. GI: Soft.  No distension. There is no tenderness.  Skin: WDI  Neuro: Mental Status: Patient is awake, alert, oriented to person, place, month, year, and situation. Patient is able to give a clear and coherent history. Speech  fluent, intact comprehension and repetition. No signs of aphasia or neglect. Visual Fields are full. Pupils are equal, round, and reactive to light. EOMI without ptosis or diplopia.  Facial sensation is symmetric to temperature Facial movement is symmetric.  Hearing is intact to voice. Uvula midline and palate elevates symmetrically. Shoulder shrug is symmetric. Tongue is midline without atrophy or fasciculations.  Tone is normal. Bulk is normal. 5/5 strength was present in all four extremities. Sensation is symmetric to light touch and temperature in the arms and legs. Deep Tendon Reflexes: 2+ and symmetric in the biceps and patellae. Toes are downgoing bilaterally. FNF and HKS are intact bilaterally. Gait - Deferred  I have reviewed labs in epic and the pertinent results  are: CBG 150  I have reviewed the images obtained: NCT head showed No acute intracranial abnormality.  Assessment: Tim Bradley is a 71 y.o. 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   Plan: Continue Keppra 750mg  bid. Continue magnesium 200-400mg  daily. He will need transfer to [place] today!   This patient is critically ill and at significant risk of neurological worsening, death and care requires constant monitoring of vital signs, hemodynamics,respiratory and cardiac monitoring, neurological assessment, discussion with family, other specialists and medical decision making of high complexity. I spent 75 minutes of neurocritical care time  in the care of  this patient. This was time spent independent of any time provided by nurse practitioner or PA.  Electronically signed by:  Lynnae Sandhoff, MD Page: [phone] 02/20/2022, 6:56 PM  If 7pm- 7am, please page neurology on call as listed in [place].

## 2022-02-20 NOTE — ED Notes (Signed)
Spoke with patient and his sister to encourage an IV and fluid bolus as ordered by the provider. Sister and patient refused IV. Stated they wanted to get that done at the [place] and not here. Provider informed.

## 2022-02-20 NOTE — ED Notes (Signed)
Patient noted to be having tonic clonic seizure. Lasted approximately 2-3 minutes. Provider at bedside. Sister at bedside. Dentures removed. Airway protected.

## 2022-02-20 NOTE — Clinical Note (Incomplete)
ED RNCM received call from Dr. Theda Sers  concerning tranporting patient to

## 2022-02-20 NOTE — ED Notes (Signed)
Pt sister at the front desk requesting that pt get sent to the [place] because she states that they have been here multiple times and nothing is found out. Facility sends him back home and then he has another seizure. Charge nurse notified. MD made aware.

## 2022-02-20 NOTE — Progress Notes (Signed)
EEG complete - results pending 

## 2022-02-20 NOTE — ED Notes (Signed)
Transport here to take pt to the floor.

## 2022-02-20 NOTE — ED Notes (Signed)
Patient alert, lethargic and not answering questions following seizure. Seizure pads placed on bed. Oxygen placed at 2L via [place]. Head of bed remains elevated.

## 2022-02-21 ENCOUNTER — Other Ambulatory Visit (HOSPITAL_COMMUNITY): Payer: Self-pay

## 2022-02-21 DIAGNOSIS — I5022 Chronic systolic (congestive) heart failure: Secondary | ICD-10-CM | POA: Diagnosis not present

## 2022-02-21 DIAGNOSIS — N184 Chronic kidney disease, stage 4 (severe): Secondary | ICD-10-CM | POA: Diagnosis not present

## 2022-02-21 DIAGNOSIS — I825Y1 Chronic embolism and thrombosis of unspecified deep veins of right proximal lower extremity: Secondary | ICD-10-CM

## 2022-02-21 DIAGNOSIS — G40909 Epilepsy, unspecified, not intractable, without status epilepticus: Secondary | ICD-10-CM | POA: Diagnosis not present

## 2022-02-21 LAB — CBC
HCT: 33.8 % — ABNORMAL LOW (ref 39.0–52.0)
Hemoglobin: 10.8 g/dL — ABNORMAL LOW (ref 13.0–17.0)
MCH: 28.4 pg (ref 26.0–34.0)
MCHC: 32 g/dL (ref 30.0–36.0)
MCV: 88.9 fL (ref 80.0–100.0)
Platelets: 210 10*3/uL (ref 150–400)
RBC: 3.8 MIL/uL — ABNORMAL LOW (ref 4.22–5.81)
RDW: 13.9 % (ref 11.5–15.5)
WBC: 5.1 10*3/uL (ref 4.0–10.5)
nRBC: 0 % (ref 0.0–0.2)

## 2022-02-21 LAB — BASIC METABOLIC PANEL
Anion gap: 7 (ref 5–15)
BUN: 46 mg/dL — ABNORMAL HIGH (ref 8–23)
CO2: 25 mmol/L (ref 22–32)
Calcium: 9 mg/dL (ref 8.9–10.3)
Chloride: 103 mmol/L (ref 98–111)
Creatinine, Ser: 3.04 mg/dL — ABNORMAL HIGH (ref 0.61–1.24)
GFR, Estimated: 21 mL/min — ABNORMAL LOW (ref >60)
Glucose, Bld: 206 mg/dL — ABNORMAL HIGH (ref 70–99)
Potassium: 3.8 mmol/L (ref 3.5–5.1)
Sodium: 135 mmol/L (ref 135–145)

## 2022-02-21 MED ORDER — MOMETASONE FURO-FORMOTEROL FUM 200-5 MCG/ACT IN AERO
2.0000 | INHALATION_SPRAY | Freq: Two times a day (BID) | RESPIRATORY_TRACT | Status: DC
  Administered 2022-02-21: 2 via RESPIRATORY_TRACT
  Filled 2022-02-21: qty 8.8

## 2022-02-21 MED ORDER — LEVETIRACETAM 250 MG PO TABS
750.0000 mg | ORAL_TABLET | Freq: Two times a day (BID) | ORAL | Status: DC
  Administered 2022-02-21: 750 mg via ORAL
  Filled 2022-02-21: qty 1

## 2022-02-21 MED ORDER — LEVETIRACETAM 750 MG PO TABS: 750.0000 mg | ORAL_TABLET | Freq: Two times a day (BID) | ORAL | 2 refills | Status: DC

## 2022-02-21 MED ORDER — LEVETIRACETAM 750 MG PO TABS
750.0000 mg | ORAL_TABLET | Freq: Two times a day (BID) | ORAL | 0 refills | Status: DC
  Filled 2022-02-21: qty 28, 14d supply, fill #0

## 2022-02-21 NOTE — TOC Transition Note (Signed)
Transition of Care [facility]) - CM/SW Discharge Note   Patient Details  Name: Tim Bradley MRN: [id number] Date of Birth: 05-26-1951  [facility]) CM/SW Contact:  Ninfa Meeker, RN Phone Number: 02/21/2022, 12:31 PM   Clinical Narrative:   Case manager contacted patient's sister, [name] [phone] to discuss patients discharge and transportation needs. Patient has been scheduled for [facility] by PTAR between 2-3pm. Bedside RN, [name] Billings is aware. Medical Necessity Form has been completed. No further TOC needs Identified.    Final next level of care: Home/Self Care Barriers to Discharge: Barriers Resolved   Patient Goals and CMS Choice     Choice offered to / list presented to : Sibling  Discharge Placement                       Discharge Plan and Services In-house Referral: Clinical Social Work                                   Social Determinants of Health (SDOH) Interventions     Readmission Risk Interventions    10/01/2020   12:53 PM 07/24/2019   11:09 AM  Readmission Risk Prevention Plan  Transportation Screening Complete Complete  PCP or Specialist Appt within 3-5 Days  Complete  HRI or [place]  Complete  Social Work Consult for [place] Planning/Counseling  Complete  Palliative Care Screening  Not Applicable  Medication Review Press photographer) Complete Complete  PCP or Specialist appointment within 3-5 days of discharge Complete   HRI or [place] Complete   SW Recovery Care/Counseling Consult Complete   Palliative Care Screening Complete   [place] Patient Refused

## 2022-02-21 NOTE — Progress Notes (Signed)
  Transition of Care [facility]) Screening Note   Patient Details  Name: Tim Bradley Date of Birth: 11/11/1950   [facility]) CM/SW Contact:    Benard Halsted, LCSW Phone Number: 02/21/2022, 9:51 AM    Transition of Care Department [facility]) has reviewed patient and completed [facility] 72 hour online notification for patient, ID# [phone]. Patient arrives from home; he had a recent stay at [facility] [place] (on 01/31/22).

## 2022-02-21 NOTE — Procedures (Signed)
Patient Name: Tim Bradley  MRN: [id number]  Epilepsy Attending: Lora Havens  Referring Physician/Provider: Karmen Bongo, MD Date: 02/20/2022  Duration: 21.02 mins  Patient history: 71 y.o. male PMHx it is not clear if he really had seizures but developed SDH.  EEG to evaluate for seizure  Level of alertness: Awake  AEDs during EEG study: Keppra  Technical aspects: This EEG study was done with scalp electrodes positioned according to the 10-20 International system of electrode placement. Electrical activity was acquired at a sampling rate of 500Hz  and reviewed with a high frequency filter of 70Hz  and a low frequency filter of 1Hz . EEG data were recorded continuously and digitally stored.   Description: The posterior dominant rhythm consists of 8-9 Hz activity of moderate voltage (25-35 uV) seen predominantly in posterior head regions, symmetric and reactive to eye opening and eye closing. EEG showed intermittent generalized 3 to 6 Hz theta-delta slowing. Hyperventilation and photic stimulation were not performed.     ABNORMALITY - Intermittent slow, generalized  IMPRESSION: This study is suggestive of mild diffuse encephalopathy, nonspecific etiology. No seizures or epileptiform discharges were seen throughout the recording.  Adriana Lina [name]

## 2022-02-21 NOTE — TOC Transition Note (Signed)
Transition of Care [facility]) - CM/SW Discharge Note   Patient Details  Name: Tim Bradley MRN: [id number] Date of Birth: [birth year]  [facility]) CM/SW Contact:  Benard Halsted, LCSW Phone Number: 02/21/2022, 12:16 PM   Clinical Narrative:    10:30am-CSW received return call from [place], [place]. She stated that patient's sister was saying the hospital requested rehab for patient. CSW explained that per MD patient is at baseline and is in observation status so he does not currently qualify for rehab and has been discharged. She expressed understanding and stated that she would arrange home care for patient and he can discuss placement at his next PCP appointment on June 5th, including long term care if needed.  CSW faxed script to [place]. [phone]). TOC pharamcy to provide 2 week supply of meds to patient's room while [facility]. CSW faxed DC Summary to the [place] Dr. Daneil Dolin [phone]) as requested.  12pm-Per RNCM, sister requesting SNF placement still and that the [place] told her he needs to go there to be hospitalized.   CSW spoke with [place] again and she talked with sister and explained there is no medical need for patient to transfer to the [place]. [facility] [place] will follow up with her regarding placement.   CSW staffed case with [facility] Supervisor who confirmed that patient is unable to remain in the hospital solely for placement since patient is at baseline.   CSW spoke with sister (and her son on the other line) and explained situation and she reported agreement to receive patient home after 2pm. She will follow up with the [place]. RNCM to arrange PTAR.    Final next level of care: Home/Self Care Barriers to Discharge: Barriers Resolved   Patient Goals and CMS Choice     Choice offered to / list presented to : Sibling  Discharge Placement                       Discharge Plan and Services In-house Referral: Clinical Social Work                                    Social Determinants of Health (SDOH) Interventions     Readmission Risk Interventions    10/01/2020   12:53 PM 07/24/2019   11:09 AM  Readmission Risk Prevention Plan  Transportation Screening Complete Complete  PCP or Specialist Appt within 3-5 Days  Complete  HRI or [place]  Complete  Social Work Consult for [place] Planning/Counseling  Complete  Palliative Care Screening  Not Applicable  Medication Review Press photographer) Complete Complete  PCP or Specialist appointment within 3-5 days of discharge Complete   HRI or [place] Complete   SW Recovery Care/Counseling Consult Complete   Palliative Care Screening Complete   [place] Patient Refused

## 2022-02-21 NOTE — Discharge Summary (Signed)
PATIENT DETAILS Name: Tim Bradley Age: 71 y.o. Sex: male Date of Birth: 1950/10/10 MRN: [id number]. Admitting Physician: Karmen Bongo, MD [id number]  Admit Date: 02/20/2022 Discharge date: 02/21/2022  Recommendations for Outpatient Follow-up:  Follow up with PCP in 1-2 weeks Please obtain CMP/CBC in one week [facility]   Admitted From:  Home  Disposition: Home   Discharge Condition: good  CODE STATUS:   Code Status: DNR   Diet recommendation:  Diet Order             Diet - low sodium heart healthy           Diet Carb Modified           Diet Heart Room service appropriate? Yes; Fluid consistency: Thin  Diet effective ____                    Brief Summary: Patient is a 71 y.o.  male chronic HFrEF, CKD stage IV, DM, HTN, seizure disorder-presenting with seizure-like activity.  Evaluated by neurology-and started on Keppra.     Significant events: 5/25>> admit to [facility] for breakthrough seizures.   Significant studies: 5/25>> CT head: No acute intracranial abnormality. 5/25>> EEG: No seizures   Significant microbiology data:     Procedures:     Consults: Neurology  Brief Hospital Course: Breakthrough seizures: Noncompliant to [place] (per sister who I talked to on 5/26-due to financial issues).  Evaluated by neurology-loaded on Keppra-now on Keppra 750 mg twice daily.  EEG negative.  CT head negative for acute abnormalities.  Discussed with neurologist Dr. Theda Sers today-no further work-up indicated-to continue Keppra on discharge.  Patient follows with neurology at the [facility] system.  After discussion with [facility] pharmacy-I have sent a 2-week supply of Keppra to [facility] pharmacy-and will leave a paper prescription on the chart 4 the patient to take to the [facility] pharmacy.    Chronic HFrEF (EF 35-40% by TTE on 01/26/2022): Euvolemic-resume Demadex   HTN: BP stable-continue Coreg  HLD: Continue Lipitor  DM-2 (A1c  7.0 on 12/21/2021): CBG stable-diet controlled at home.   History of VTE: Continue Eliquis.  CKD stage IV: Creatinine at baseline.  BPH: Monitor-apparently no longer on Proscar/Flomax.   Chronic debility/deconditioning: Claims he has been nonambulatory ever since he "broke his back"-and is mostly wheelchair-bound for the past 2 years.   Obesity: Estimated body mass index is 32.1 kg/m as calculated from the following:   Height as of this encounter: 6\' 2"  (1.88 m).   Weight as of this encounter: 113.4 kg.   RN pressure injury documentation: Pressure Injury 05/03/20 Buttocks Right Stage 2 -  Partial thickness loss of dermis presenting as a shallow open injury with a red, pink wound bed without slough. (Active)  05/03/20 0055  Location: Buttocks  Location Orientation: Right  Staging: Stage 2 -  Partial thickness loss of dermis presenting as a shallow open injury with a red, pink wound bed without slough.  Wound Description (Comments):   Present on Admission: Yes    Discharge Diagnoses:  Principal Problem:   Recurrent seizures ([place]) Active Problems:   Chronic systolic CHF (congestive heart failure) (HCC)   Essential hypertension   Type 2 diabetes mellitus with hyperlipidemia (HCC)   DVT (deep venous thrombosis) (HCC)   CKD (chronic kidney disease) stage 4, GFR 15-29 ml/min (HCC)   Class 1 obesity   DNR (do not resuscitate)   Discharge Instructions:  Activity:  As tolerated with Full fall precautions use walker/cane & assistance as needed  Discharge Instructions     Call MD for:   Complete by: As directed    Recurrent seizures   Diet - low sodium heart healthy   Complete by: As directed    Diet Carb Modified   Complete by: As directed    Discharge instructions   Complete by: As directed      Seizure precautions: Per [facility] statutes, patients with seizures are not allowed to drive until they have been seizure-free for six months and cleared by a physician     Use caution when using heavy equipment or power tools. Avoid working on ladders or at heights. Take showers instead of baths. Ensure the water temperature is not too high on the home water heater. Do not go swimming alone. Do not lock yourself in a room alone (i.e. bathroom). When caring for infants or small children, sit down when holding, feeding, or changing them to minimize risk of injury to the child in the event you have a seizure. Maintain good sleep hygiene. Avoid alcohol.    If patient has another seizure, call 911 and bring them back to the ED if: A.  The seizure lasts longer than 5 minutes.      B.  The patient doesn't wake shortly after the seizure or has new problems such as difficulty seeing, speaking or moving following the seizure C.  The patient was injured during the seizure D.  The patient has a temperature over 102 F (39C) E.  The patient vomited during the seizure and now is having trouble breathing    During the Seizure   - First, ensure adequate ventilation and place patients on the floor on their left side  Loosen clothing around the neck and ensure the airway is patent. If the patient is clenching the teeth, do not force the mouth open with any object as this can cause severe damage - Remove all items from the surrounding that can be hazardous. The patient may be oblivious to what's happening and may not even know what he or she is doing. If the patient is confused and wandering, either gently guide him/her away and block access to outside areas - Reassure the individual and be comforting - Call 911. In most cases, the seizure ends before EMS arrives. However, there are cases when seizures may last over 3 to 5 minutes. Or the individual may have developed breathing difficulties or severe injuries. If a pregnant patient or a person with diabetes develops a seizure, it is prudent to call an ambulance. - Finally, if the patient does not regain full consciousness, then call  EMS. Most patients will remain confused for about 45 to 90 minutes after a seizure, so you must use judgment in calling for help. - Avoid restraints but make sure the patient is in a bed with padded side rails - Place the individual in a lateral position with the neck slightly flexed; this will help the saliva drain from the mouth and prevent the tongue from falling backward - Remove all nearby furniture and other hazards from the area - Provide verbal assurance as the individual is regaining consciousness - Provide the patient with privacy if possible - Call for help and start treatment as ordered by the caregiver    After the Seizure (Postictal Stage)   After a seizure, most patients experience confusion, fatigue, muscle pain and/or a headache. Thus, one should permit the  individual to sleep. For the next few days, reassurance is essential. Being calm and helping reorient the person is also of importance.   Most seizures are painless and end spontaneously. Seizures are not harmful to others but can lead to complications such as stress on the lungs, brain and the heart. Individuals with prior lung problems may develop labored breathing and respiratory distress.    Follow with Primary MD  [place] in 1-2 weeks  Please get a complete blood count and chemistry panel checked by your Primary MD at your next visit, and again as instructed by your Primary MD.  Get Medicines reviewed and adjusted: Please take all your medications with you for your next visit with your Primary MD  Laboratory/radiological data: Please request your Primary MD to go over all hospital tests and procedure/radiological results at the follow up, please ask your Primary MD to get all Hospital records sent to his/her office.  In some cases, they will be blood work, cultures and biopsy results pending at the time of your discharge. Please request that your primary care M.D. follows up on these results.  Also Note  the following: If you experience worsening of your admission symptoms, develop shortness of breath, life threatening emergency, suicidal or homicidal thoughts you must seek medical attention immediately by calling 911 or calling your MD immediately  if symptoms less severe.  You must read complete instructions/literature along with all the possible adverse reactions/side effects for all the Medicines you take and that have been prescribed to you. Take any new Medicines after you have completely understood and accpet all the possible adverse reactions/side effects.   Do not drive when taking Pain medications or sleeping medications (Benzodaizepines)  Do not take more than prescribed Pain, Sleep and Anxiety Medications. It is not advisable to combine anxiety,sleep and pain medications without talking with your primary care practitioner  Special Instructions: If you have smoked or chewed Tobacco  in the last 2 yrs please stop smoking, stop any regular Alcohol  and or any Recreational drug use.  Wear Seat belts while driving.  Please note: You were cared for by a hospitalist during your hospital stay. Once you are discharged, your primary care physician will handle any further medical issues. Please note that NO REFILLS for any discharge medications will be authorized once you are discharged, as it is imperative that you return to your primary care physician (or establish a relationship with a primary care physician if you do not have one) for your post hospital discharge needs so that they can reassess your need for medications and monitor your lab values.   Increase activity slowly   Complete by: As directed       Allergies as of 02/21/2022       Reactions   Trazodone And Nefazodone Other (See Comments)   Family reports seizures from this medication        Medication List     TAKE these medications    acetaminophen 500 MG tablet Commonly known as: TYLENOL Take 1,000 mg by mouth every  6 (six) hours as needed for moderate pain or headache.   apixaban 5 MG Tabs tablet Commonly known as: ELIQUIS Take 1 tablet (5 mg total) by mouth 2 (two) times daily.   atorvastatin 80 MG tablet Commonly known as: LIPITOR Take 1 tablet (80 mg total) by mouth daily.   carvedilol 6.25 MG tablet Commonly known as: COREG Take 1 tablet (6.25 mg total) by mouth 2 (two)  times daily with a meal.   cyanocobalamin 1000 MCG tablet Take 1 tablet (1,000 mcg total) by mouth daily.   levETIRAcetam 750 MG tablet Commonly known as: KEPPRA Take 1 tablet (750 mg total) by mouth 2 (two) times daily.   Mag-Oxide 200 MG Tabs Generic drug: Magnesium Oxide Take 200 mg by mouth daily.   mometasone-formoterol 200-5 MCG/ACT Aero Commonly known as: DULERA Inhale 2 puffs into the lungs 2 (two) times daily.   QC Vitamin D3 50 MCG (2000 UT) Tabs Generic drug: Cholecalciferol Take 2,000 Units by mouth daily.   torsemide 100 MG tablet Commonly known as: DEMADEX Take 100 mg by mouth daily. What changed: Another medication with the same name was removed. Continue taking this medication, and follow the directions you see here.   VISINE-A OP Place 1 drop into both eyes daily as needed (dry/itchy eyes).        Follow-up [facility]. Schedule an appointment as soon as possible for a visit in 1 week(s).   Specialty: General Practice Contact information: [place] [phone] [phone]         Josue Hector, MD Follow up in 1 month(s).   Specialty: Cardiology Contact information: [address] [place] [address] [phone]                Allergies  Allergen Reactions   Trazodone And Nefazodone Other (See Comments)    Family reports seizures from this medication     Other Procedures/Studies: CT Head Wo Contrast  Result Date: 02/20/2022 CLINICAL DATA:  Mental status change. EXAM: CT HEAD WITHOUT CONTRAST  TECHNIQUE: Contiguous axial images were obtained from the base of the skull through the vertex without intravenous contrast. RADIATION DOSE REDUCTION: This exam was performed according to the departmental dose-optimization program which includes automated exposure control, adjustment of the mA and/or kV according to patient size and/or use of iterative reconstruction technique. COMPARISON:  Head CT dated January 24, 2022 FINDINGS: Brain: Chronic white matter ischemic change. No evidence of acute infarction, hemorrhage, hydrocephalus, extra-axial collection or mass lesion/mass effect. Vascular: No hyperdense vessel or unexpected calcification. Skull: Normal. Negative for fracture or focal lesion. Sinuses/Orbits: No acute finding. Other: None. IMPRESSION: No acute intracranial abnormality. Electronically Signed   By: Yetta Glassman M.D.   On: 02/20/2022 13:02   CT Head Wo Contrast  Result Date: 01/24/2022 CLINICAL DATA:  Headache, new or worsening (Age >= 50y) EXAM: CT HEAD WITHOUT CONTRAST TECHNIQUE: Contiguous axial images were obtained from the base of the skull through the vertex without intravenous contrast. RADIATION DOSE REDUCTION: This exam was performed according to the departmental dose-optimization program which includes automated exposure control, adjustment of the mA and/or kV according to patient size and/or use of iterative reconstruction technique. COMPARISON:  10/02/2020 FINDINGS: Brain: Unchanged atrophy. Progressive chronic small vessel ischemic change. There is a remote lacunar infarct in the left cerebellum, unchanged. Remote lacunar infarct in the right basal ganglia. No intracranial hemorrhage, mass effect, or midline shift. No hydrocephalus. The basilar cisterns are patent. No evidence of territorial infarct or acute ischemia. No extra-axial or intracranial fluid collection. Vascular: No hyperdense vessel or unexpected calcification. Skull: No fracture or focal lesion. Sinuses/Orbits:  Occasional opacification of right mastoid air cells. Paranasal sinuses are clear. Unremarkable orbits. Other: None. IMPRESSION: 1. No acute intracranial abnormality. 2. Unchanged atrophy. Progression in chronic small vessel ischemic change since January 2022. 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 Electronically Signed   By: Keith Rake M.D.   On: 01/24/2022 18:56   US RENAL  Result Date: 01/28/2022 CLINICAL DATA:  Pyelonephritis. EXAM: RENAL / URINARY TRACT ULTRASOUND COMPLETE COMPARISON:  None Available. FINDINGS: Right Kidney: Renal measurements: 10.4 x 6.0 x 4.5 cm = volume: 146 mL. Echogenicity within normal limits. No mass or hydronephrosis visualized. Left Kidney: Renal measurements: 10.0 x 5.0 x 4.0 cm = volume: 105 mL. Echogenicity within normal limits. No mass or hydronephrosis visualized. Bladder: Appears normal for degree of bladder distention. Other: None. IMPRESSION: Normal renal ultrasound. Electronically Signed   By: Marijo Conception M.D.   On: 01/28/2022 13:11   DG Chest Port 1 View  Result Date: 02/19/2022 CLINICAL DATA:  Altered mental status. EXAM: PORTABLE CHEST 1 VIEW COMPARISON:  01/24/2022 and prior radiographs FINDINGS: The cardiomediastinal silhouette is unremarkable. Mild elevation of the RIGHT hemidiaphragm again noted. There is no evidence of focal airspace disease, pulmonary edema, suspicious pulmonary nodule/mass, pleural effusion, or pneumothorax. No acute bony abnormalities are identified. IMPRESSION: No active disease. Electronically Signed   By: Margarette Canada M.D.   On: 02/19/2022 16:34   DG Chest Port 1 View  Result Date: 01/24/2022 CLINICAL DATA:  Shortness of breath with questionable sepsis EXAM: PORTABLE CHEST 1 VIEW COMPARISON:  January 17, 2022 FINDINGS: Heart is borderline. Mediastinal contours are within normal limits. Both lungs are clear without focal consolidation, visible pleural effusion or pneumothorax. Moderate hypertrophic  changes of the right AC joint. IMPRESSION: No active disease. Electronically Signed   By: Frazier Richards M.D.   On: 01/24/2022 17:16   EEG adult  Result Date: 02/21/2022 Lora Havens, MD     02/21/2022  8:41 AM Patient Name: Tim Bradley MRN: [id number] Epilepsy Attending: Lora Havens Referring Physician/Provider: Karmen Bongo, MD Date: 02/20/2022 Duration: 21.02 mins Patient history: 71 y.o. male PMHx it is not clear if he really had seizures but developed SDH.  EEG to evaluate for seizure Level of alertness: Awake AEDs during EEG study: Keppra Technical aspects: This EEG study was done with scalp electrodes positioned according to the 10-20 International system of electrode placement. Electrical activity was acquired at a sampling rate of 500Hz  and reviewed with a high frequency filter of 70Hz  and a low frequency filter of 1Hz . EEG data were recorded continuously and digitally stored. Description: The posterior dominant rhythm consists of 8-9 Hz activity of moderate voltage (25-35 uV) seen predominantly in posterior head regions, symmetric and reactive to eye opening and eye closing. EEG showed intermittent generalized 3 to 6 Hz theta-delta slowing. Hyperventilation and photic stimulation were not performed.   ABNORMALITY - Intermittent slow, generalized IMPRESSION: This study is suggestive of mild diffuse encephalopathy, nonspecific etiology. No seizures or epileptiform discharges were seen throughout the recording. Lora Havens   ECHOCARDIOGRAM COMPLETE  Result Date: 01/26/2022    ECHOCARDIOGRAM REPORT   Patient Name:   JETTIE LAZARE Zurcher Date of Exam: 01/26/2022 Medical Rec #:  [id number]     Height:       74.5 in Accession #:    [id number]    Weight:       254.2 lb Date of Birth:  1951/01/25     BSA:          2.418 m Patient Age:    30 years      BP:           108/66 mmHg Patient Gender: M  HR:           87 bpm. Exam Location:  Inpatient Procedure: 2D Echo, Cardiac Doppler, Color  Doppler and Intracardiac            Opacification Agent Indications:    Cardiomyopathy, unspecified.  History:        Patient has prior history of Echocardiogram examinations, most                 recent 04/11/2020. CHF, Signs/Symptoms:Dyspnea; Risk                 Factors:Diabetes, Hypertension and Current Smoker. CKD. Hx DVT.  Sonographer:    Clayton Lefort RDCS (AE) Referring Phys: [place]  Sonographer Comments: Technically difficult study due to poor echo windows. IMPRESSIONS  1. Left ventricular ejection fraction, by estimation, is 35 to 40%. The left ventricle has moderately decreased function. The left ventricle demonstrates global hypokinesis. There is moderate left ventricular hypertrophy. Left ventricular diastolic parameters are indeterminate.  2. Right ventricular systolic function is normal. The right ventricular size is normal. Tricuspid regurgitation signal is inadequate for assessing PA pressure.  3. The mitral valve is abnormal. Trivial mitral valve regurgitation. No evidence of mitral stenosis.  4. The aortic valve has an indeterminant number of cusps. There is mild calcification of the aortic valve. There is mild thickening of the aortic valve. Aortic valve regurgitation is not visualized. No aortic stenosis is present.  5. Aortic dilatation noted. There is mild dilatation of the ascending aorta, measuring 38 mm. FINDINGS  Left Ventricle: Septal motion consistent with bundle branch block. Left ventricular ejection fraction, by estimation, is 35 to 40%. The left ventricle has moderately decreased function. The left ventricle demonstrates global hypokinesis. Definity contrast agent was given IV to delineate the left ventricular endocardial borders. The left ventricular internal cavity size was normal in size. There is moderate left ventricular hypertrophy. Left ventricular diastolic parameters are indeterminate. Right Ventricle: The right ventricular size is normal. Right vetricular wall  thickness was not well visualized. Right ventricular systolic function is normal. Tricuspid regurgitation signal is inadequate for assessing PA pressure. Left Atrium: Left atrial size was not well visualized. Right Atrium: Right atrial size was not well visualized. Pericardium: There is no evidence of pericardial effusion. Mitral Valve: The mitral valve is abnormal. There is mild thickening of the mitral valve leaflet(s). There is mild calcification of the mitral valve leaflet(s). Mild mitral annular calcification. Trivial mitral valve regurgitation. No evidence of mitral valve stenosis. MV peak gradient, 10.2 mmHg. The mean mitral valve gradient is 4.0 mmHg. Tricuspid Valve: The tricuspid valve is not well visualized. Tricuspid valve regurgitation is not demonstrated. No evidence of tricuspid stenosis. Aortic Valve: The aortic valve has an indeterminant number of cusps. There is mild calcification of the aortic valve. There is mild thickening of the aortic valve. There is mild aortic valve annular calcification. Aortic valve regurgitation is not visualized. No aortic stenosis is present. Aortic valve mean gradient measures 3.0 mmHg. Aortic valve peak gradient measures 5.4 mmHg. Aortic valve area, by VTI measures 2.38 cm. Pulmonic Valve: The pulmonic valve was not well visualized. Pulmonic valve regurgitation is not visualized. No evidence of pulmonic stenosis. Aorta: The aortic root is normal in size and structure and aortic dilatation noted. There is mild dilatation of the ascending aorta, measuring 38 mm. Venous: The inferior vena cava was not well visualized. IAS/Shunts: The interatrial septum was not well visualized.  LEFT VENTRICLE PLAX 2D LVIDd:  5.30 cm LVIDs:         4.40 cm LV PW:         1.50 cm LV IVS:        1.50 cm LVOT diam:     2.10 cm LV SV:         51 LV SV Index:   21 LVOT Area:     3.46 cm  RIGHT VENTRICLE             IVC RV Basal diam:  2.60 cm     IVC diam: 1.20 cm RV S prime:      18.30 cm/s TAPSE (M-mode): 2.3 cm LEFT ATRIUM             Index        RIGHT ATRIUM           Index LA diam:        3.60 cm 1.49 cm/m   RA Area:     12.00 cm LA Vol (A2C):   31.1 ml 12.86 ml/m  RA Volume:   24.80 ml  10.26 ml/m LA Vol (A4C):   42.0 ml 17.37 ml/m LA Biplane Vol: 37.1 ml 15.34 ml/m  AORTIC VALVE AV Area (Vmax):    2.49 cm AV Area (Vmean):   2.47 cm AV Area (VTI):     2.38 cm AV Vmax:           116.00 cm/s AV Vmean:          76.300 cm/s AV VTI:            0.214 m AV Peak Grad:      5.4 mmHg AV Mean Grad:      3.0 mmHg LVOT Vmax:         83.30 cm/s LVOT Vmean:        54.500 cm/s LVOT VTI:          0.147 m LVOT/AV VTI ratio: 0.69  AORTA Ao Root diam: 3.80 cm Ao Asc diam:  4.00 cm MITRAL VALVE MV Area VTI:  1.80 cm   SHUNTS MV Peak grad: 10.2 mmHg  Systemic VTI:  0.15 m MV Mean grad: 4.0 mmHg   Systemic Diam: 2.10 cm MV Vmax:      1.60 m/s MV Vmean:     91.0 cm/s Carlyle Dolly MD Electronically signed by Carlyle Dolly MD Signature Date/Time: 01/26/2022/4:10:16 PM    Final      TODAY-DAY OF DISCHARGE:  Subjective:   Cay Schillings today has no headache,no chest abdominal pain,no new weakness tingling or numbness, feels much better wants to go home today.   Objective:   Blood pressure 138/71, pulse 68, temperature 97.7 F (36.5 C), temperature source Oral, resp. rate 17, height 6\' 2"  (1.88 m), weight 113.4 kg, SpO2 95 %.  Intake/Output Summary (Last 24 hours) at 02/21/2022 1055 Last data filed at 02/20/2022 2005 Gross per 24 hour  Intake 453.72 ml  Output --  Net 453.72 ml   Filed Weights   02/20/22 1206  Weight: 113.4 kg    Exam: Awake Alert, Oriented *3, No new F.N deficits, Normal affect [place].AT,PERRAL Supple Neck,No JVD, No cervical lymphadenopathy appriciated.  Symmetrical Chest wall movement, Good air movement bilaterally, CTAB RRR,No Gallops,Rubs or new Murmurs, No Parasternal Heave +ve B.Sounds, Abd Soft, Non tender, No organomegaly appriciated, No rebound  -guarding or rigidity. No Cyanosis, Clubbing or edema, No new Rash or bruise   PERTINENT RADIOLOGIC STUDIES: CT Head Wo Contrast  Result Date:  02/20/2022 CLINICAL DATA:  Mental status change. EXAM: CT HEAD WITHOUT CONTRAST TECHNIQUE: Contiguous axial images were obtained from the base of the skull through the vertex without intravenous contrast. RADIATION DOSE REDUCTION: This exam was performed according to the departmental dose-optimization program which includes automated exposure control, adjustment of the mA and/or kV according to patient size and/or use of iterative reconstruction technique. COMPARISON:  Head CT dated January 24, 2022 FINDINGS: Brain: Chronic white matter ischemic change. No evidence of acute infarction, hemorrhage, hydrocephalus, extra-axial collection or mass lesion/mass effect. Vascular: No hyperdense vessel or unexpected calcification. Skull: Normal. Negative for fracture or focal lesion. Sinuses/Orbits: No acute finding. Other: None. IMPRESSION: No acute intracranial abnormality. Electronically Signed   By: Yetta Glassman M.D.   On: 02/20/2022 13:02   DG Chest Port 1 View  Result Date: 02/19/2022 CLINICAL DATA:  Altered mental status. EXAM: PORTABLE CHEST 1 VIEW COMPARISON:  01/24/2022 and prior radiographs FINDINGS: The cardiomediastinal silhouette is unremarkable. Mild elevation of the RIGHT hemidiaphragm again noted. There is no evidence of focal airspace disease, pulmonary edema, suspicious pulmonary nodule/mass, pleural effusion, or pneumothorax. No acute bony abnormalities are identified. IMPRESSION: No active disease. Electronically Signed   By: Margarette Canada M.D.   On: 02/19/2022 16:34   EEG adult  Result Date: 02/21/2022 Lora Havens, MD     02/21/2022  8:41 AM Patient Name: Tim Bradley MRN: [id number] Epilepsy Attending: Lora Havens Referring Physician/Provider: Karmen Bongo, MD Date: 02/20/2022 Duration: 21.02 mins Patient history: 71 y.o. male PMHx it  is not clear if he really had seizures but developed SDH.  EEG to evaluate for seizure Level of alertness: Awake AEDs during EEG study: Keppra Technical aspects: This EEG study was done with scalp electrodes positioned according to the 10-20 International system of electrode placement. Electrical activity was acquired at a sampling rate of 500Hz  and reviewed with a high frequency filter of 70Hz  and a low frequency filter of 1Hz . EEG data were recorded continuously and digitally stored. Description: The posterior dominant rhythm consists of 8-9 Hz activity of moderate voltage (25-35 uV) seen predominantly in posterior head regions, symmetric and reactive to eye opening and eye closing. EEG showed intermittent generalized 3 to 6 Hz theta-delta slowing. Hyperventilation and photic stimulation were not performed.   ABNORMALITY - Intermittent slow, generalized IMPRESSION: This study is suggestive of mild diffuse encephalopathy, nonspecific etiology. No seizures or epileptiform discharges were seen throughout the recording. Priyanka O Yadav     PERTINENT LAB RESULTS: CBC: Recent Labs    02/20/22 1235 02/21/22 0353  WBC 4.1 5.1  HGB 11.5* 10.8*  HCT 37.4* 33.8*  PLT 223 210   CMET CMP     Component Value Date/Time   NA 135 02/21/2022 0353   K 3.8 02/21/2022 0353   CL 103 02/21/2022 0353   CO2 25 02/21/2022 0353   GLUCOSE 206 (H) 02/21/2022 0353   BUN 46 (H) 02/21/2022 0353   CREATININE 3.04 (H) 02/21/2022 0353   CALCIUM 9.0 02/21/2022 0353   PROT 7.0 02/20/2022 1235   ALBUMIN 3.4 (L) 02/20/2022 1235   AST 13 (L) 02/20/2022 1235   ALT 13 02/20/2022 1235   ALKPHOS 64 02/20/2022 1235   BILITOT 1.0 02/20/2022 1235   GFRNONAA 21 (L) 02/21/2022 0353   GFRAA 32 (L) 07/01/2020 0446    GFR Estimated Creatinine Clearance: 29 mL/min (A) (by C-G formula based on SCr of 3.04 mg/dL (H)). No results for input(s): LIPASE, AMYLASE in the last 72  hours. No results for input(s): CKTOTAL, CKMB,  CKMBINDEX, TROPONINI in the last 72 hours. Invalid input(s): POCBNP No results for input(s): DDIMER in the last 72 hours. No results for input(s): HGBA1C in the last 72 hours. No results for input(s): CHOL, HDL, LDLCALC, TRIG, CHOLHDL, LDLDIRECT in the last 72 hours. Recent Labs    02/20/22 1235  TSH 0.801   No results for input(s): VITAMINB12, FOLATE, FERRITIN, TIBC, IRON, RETICCTPCT in the last 72 hours. Coags: No results for input(s): INR in the last 72 hours.  Invalid input(s): PT Microbiology: Recent Results (from the past 240 hour(s))  Resp Panel by RT-PCR (Flu A&B, Covid) Nasopharyngeal Swab     Status: None   Collection Time: 02/19/22  4:14 PM   Specimen: Nasopharyngeal Swab; Nasopharyngeal(NP) swabs in vial transport medium  Result Value Ref Range Status   SARS Coronavirus 2 by RT PCR NEGATIVE NEGATIVE Final    Comment: (NOTE) SARS-CoV-2 target nucleic acids are NOT DETECTED.  The SARS-CoV-2 RNA is generally detectable in upper respiratory specimens during the acute phase of infection. The lowest concentration of SARS-CoV-2 viral copies this assay can detect is 138 copies/mL. A negative result does not preclude SARS-Cov-2 infection and should not be used as the sole basis for treatment or other patient management decisions. A negative result may occur with  improper specimen collection/handling, submission of specimen other than nasopharyngeal swab, presence of viral mutation(s) within the areas targeted by this assay, and inadequate number of viral copies(<138 copies/mL). A negative result must be combined with clinical observations, patient history, and epidemiological information. The expected result is Negative.  Fact Sheet for Patients:  EntrepreneurPulse.com.au  Fact Sheet for Healthcare Providers:  IncredibleEmployment.be  This test is no t yet approved or cleared by the [place] FDA and  has been authorized for  detection and/or diagnosis of SARS-CoV-2 by FDA under an Emergency Use Authorization (EUA). This EUA will remain  in effect (meaning this test can be used) for the duration of the COVID-19 declaration under Section 564(b)(1) of the Act, 21 U.S.C.section 360bbb-3(b)(1), unless the authorization is terminated  or revoked sooner.       Influenza A by PCR NEGATIVE NEGATIVE Final   Influenza B by PCR NEGATIVE NEGATIVE Final    Comment: (NOTE) The Xpert Xpress SARS-CoV-2/FLU/RSV plus assay is intended as an aid in the diagnosis of influenza from Nasopharyngeal swab specimens and should not be used as a sole basis for treatment. Nasal washings and aspirates are unacceptable for Xpert Xpress SARS-CoV-2/FLU/RSV testing.  Fact Sheet for Patients: EntrepreneurPulse.com.au  Fact Sheet for Healthcare Providers: IncredibleEmployment.be  This test is not yet approved or cleared by the [place] FDA and has been authorized for detection and/or diagnosis of SARS-CoV-2 by FDA under an Emergency Use Authorization (EUA). This EUA will remain in effect (meaning this test can be used) for the duration of the COVID-19 declaration under Section 564(b)(1) of the Act, 21 U.S.C. section 360bbb-3(b)(1), unless the authorization is terminated or revoked.  Performed at [facility], [place] [address], [place], [place] [address]   Culture, blood (routine x 2)     Status: None (Preliminary result)   Collection Time: 02/19/22  4:14 PM   Specimen: BLOOD  Result Value Ref Range Status   Specimen Description BLOOD BLOOD RIGHT FOREARM  Final   Special Requests   Final    BOTTLES DRAWN AEROBIC AND ANAEROBIC Blood Culture adequate volume   Culture   Final    NO  GROWTH 2 DAYS Performed at [facility], [place] [address], [place], [place] [address]    Report Status PENDING  Incomplete  Group A Strep by PCR     Status: None   Collection Time: 02/19/22  4:14 PM    Specimen: Nasopharyngeal Swab; Sterile Swab  Result Value Ref Range Status   Group A Strep by PCR NOT DETECTED NOT DETECTED Final    Comment: Performed at [facility], [place] [address] [place], [place] [address]  Urine Culture     Status: Abnormal   Collection Time: 02/19/22  4:50 PM   Specimen: Urine, Clean Catch  Result Value Ref Range Status   Specimen Description URINE, CLEAN CATCH  Final   Special Requests   Final    NONE Performed at [facility], [place] [address], [place],  [address]    Culture MULTIPLE SPECIES PRESENT, SUGGEST RECOLLECTION (A)  Final   Report Status 02/20/2022 FINAL  Final    FURTHER DISCHARGE INSTRUCTIONS:  Get Medicines reviewed and adjusted: Please take all your medications with you for your next visit with your Primary MD  Laboratory/radiological data: Please request your Primary MD to go over all hospital tests and procedure/radiological results at the follow up, please ask your Primary MD to get all Hospital records sent to his/her office.  In some cases, they will be blood work, cultures and biopsy results pending at the time of your discharge. Please request that your primary care M.D. goes through all the records of your hospital data and follows up on these results.  Also Note the following: If you experience worsening of your admission symptoms, develop shortness of breath, life threatening emergency, suicidal or homicidal thoughts you must seek medical attention immediately by calling 911 or calling your MD immediately  if symptoms less severe.  You must read complete instructions/literature along with all the possible adverse reactions/side effects for all the Medicines you take and that have been prescribed to you. Take any new Medicines after you have completely understood and accpet all the possible adverse reactions/side effects.   Do not drive when taking Pain medications or sleeping medications (Benzodaizepines)  Do not  take more than prescribed Pain, Sleep and Anxiety Medications. It is not advisable to combine anxiety,sleep and pain medications without talking with your primary care practitioner  Special Instructions: If you have smoked or chewed Tobacco  in the last 2 yrs please stop smoking, stop any regular Alcohol  and or any Recreational drug use.  Wear Seat belts while driving.  Please note: You were cared for by a hospitalist during your hospital stay. Once you are discharged, your primary care physician will handle any further medical issues. Please note that NO REFILLS for any discharge medications will be authorized once you are discharged, as it is imperative that you return to your primary care physician (or establish a relationship with a primary care physician if you do not have one) for your post hospital discharge needs so that they can reassess your need for medications and monitor your lab values.  Total Time spent coordinating discharge including counseling, education and face to face time equals greater than 30 minutes.  SignedOren Binet 02/21/2022 10:55 AM

## 2022-02-21 NOTE — Progress Notes (Signed)
CSW received request to contact patient's [place] Social Worker, [employer]. CSW left her a voicemail.   Gilmore Laroche, MSW, [facility]

## 2022-02-21 NOTE — Progress Notes (Signed)
Patient discharging home. Vital signs stable at time of discharge as reflected in discharge summary. Discharge instructions given to sister, [name] and verbal understanding returned. No questions at this time. Patient discharging with TOC medication.

## 2022-02-21 NOTE — Progress Notes (Signed)
PROGRESS NOTE        PATIENT DETAILS Name: Tim Bradley Age: 71 y.o. Sex: male Date of Birth: 1951-05-13 Admit Date: 02/20/2022 Admitting Physician Karmen Bongo, MD [facility]  Brief Summary: Patient is a 71 y.o.  male chronic HFrEF, CKD stage IV, DM, HTN, seizure disorder-presenting with seizure-like activity.  Evaluated by neurology-and started on Keppra.    Significant events: 5/25>> admit to [facility] for breakthrough seizures.  Significant studies: 5/25>> CT head: No acute intracranial abnormality. 5/25>> EEG: No seizures  Significant microbiology data:   Procedures:   Consults: Neurology  Subjective: Lying comfortably in bed-denies any chest pain or shortness of breath.  Objective: Vitals: Blood pressure 138/71, pulse 68, temperature 97.7 F (36.5 C), temperature source Oral, resp. rate 17, height 6' 2"  (1.88 m), weight 113.4 kg, SpO2 95 %.   Exam: Gen Exam:Alert awake-not in any distress HEENT:atraumatic, normocephalic Chest: B/L clear to auscultation anteriorly CVS:S1S2 regular Abdomen:soft non tender, non distended Extremities:no edema Neurology: Non focal Skin: no rash  Pertinent Labs/Radiology:    Latest Ref Rng & Units 02/21/2022    3:53 AM 02/20/2022   12:35 PM 02/19/2022    3:55 PM  CBC  WBC 4.0 - 10.5 K/uL 5.1   4.1   4.5    Hemoglobin 13.0 - 17.0 g/dL 10.8   11.5   11.4    Hematocrit 39.0 - 52.0 % 33.8   37.4   37.0    Platelets 150 - 400 K/uL 210   223   243      Lab Results  Component Value Date   NA 135 02/21/2022   K 3.8 02/21/2022   CL 103 02/21/2022   CO2 25 02/21/2022      Assessment/Plan: Breakthrough seizures: Noncompliant to [place] (per sister who I talked to on 5/26-due to financial issues).  Evaluated by neurology-loaded on Keppra-now on Keppra 750 mg twice daily.  EEG negative.  CT head negative for acute abnormalities.  Discussed with neurologist Dr. Theda Sers today-no further work-up  indicated-to continue Keppra on discharge.  Patient follows with neurology at the [facility].  Chronic HFrEF (EF 35-40% by TTE on 01/26/2022): Euvolemic-resume Demadex  HTN: BP stable-continue Coreg  HLD: Continue Lipitor  DM-2 (A1c 7.0 on 12/21/2021): CBG stable-diet controlled at home.  History of VTE: Continue Eliquis.  CKD stage IV: Creatinine at baseline.  BPH: Monitor-apparently no longer on Proscar/Flomax.  Chronic debility/deconditioning: Claims he has been nonambulatory ever since he "broke his back"-and is mostly wheelchair-bound.  Other issues: Spoke with sister-Per her-[facility] social [facility] did not want him discharged-I have asked our social worker to contact the [place] system and see if there are any obvious barriers to discharge.  He is currently medically stable for discharge-neurology does not have any plans for further work-up.  Obesity: Estimated body mass index is 32.1 kg/m as calculated from the following:   Height as of this encounter: 6' 2"  (1.88 m).   Weight as of this encounter: 113.4 kg.   Code status:   Code Status: DNR   DVT Prophylaxis: apixaban (ELIQUIS) tablet 5 mg    Family Communication: Sister-[name] Goods-[phone]-on the phone-see above.    Disposition Plan: Status is: Observation The patient remains OBS appropriate and will d/c before 2 midnights.  Medically stable-awaiting word from social worker to see if he needs to be transferred  to [place] system or he can go home.   Planned Discharge Destination:Home   Diet: Diet Order             Diet Heart Room service appropriate? Yes; Fluid consistency: Thin  Diet effective ____                     Antimicrobial agents: Anti-infectives (From admission, onward)    None        MEDICATIONS: Scheduled Meds:  apixaban  5 mg Oral BID   atorvastatin  80 mg Oral Daily   carvedilol  6.25 mg Oral BID WC   chlorhexidine gluconate (MEDLINE KIT)  15 mL Mouth Rinse BID   levETIRAcetam  750 mg  Oral BID   mouth rinse  15 mL Mouth Rinse 10 times per day   mometasone-formoterol  2 puff Inhalation BID   sodium chloride flush  3 mL Intravenous Q12H   Continuous Infusions: PRN Meds:.acetaminophen **OR** acetaminophen, hydrALAZINE, LORazepam, ondansetron **OR** ondansetron (ZOFRAN) IV   I have personally reviewed following labs and imaging studies  LABORATORY DATA: CBC: Recent Labs  Lab 02/19/22 1555 02/20/22 1235 02/21/22 0353  WBC 4.5 4.1 5.1  NEUTROABS 2.4 2.4  --   HGB 11.4* 11.5* 10.8*  HCT 37.0* 37.4* 33.8*  MCV 91.4 90.8 88.9  PLT 243 223 706    Basic Metabolic Panel: Recent Labs  Lab 02/19/22 1555 02/20/22 1235 02/21/22 0353  NA 136 138 135  K 4.2 4.2 3.8  CL 101 104 103  CO2 24 26 25   GLUCOSE 155* 150* 206*  BUN 47* 44* 46*  CREATININE 2.91* 2.83* 3.04*  CALCIUM 9.3 9.7 9.0    GFR: Estimated Creatinine Clearance: 29 mL/min (A) (by C-G formula based on SCr of 3.04 mg/dL (H)).  Liver Function Tests: Recent Labs  Lab 02/19/22 1555 02/20/22 1235  AST 15 13*  ALT 13 13  ALKPHOS 61 64  BILITOT 0.6 1.0  PROT 6.9 7.0  ALBUMIN 3.5 3.4*   No results for input(s): LIPASE, AMYLASE in the last 168 hours. Recent Labs  Lab 02/20/22 1235  AMMONIA 23    Coagulation Profile: No results for input(s): INR, PROTIME in the last 168 hours.  Cardiac Enzymes: No results for input(s): CKTOTAL, CKMB, CKMBINDEX, TROPONINI in the last 168 hours.  BNP (last 3 results) No results for input(s): PROBNP in the last 8760 hours.  Lipid Profile: No results for input(s): CHOL, HDL, LDLCALC, TRIG, CHOLHDL, LDLDIRECT in the last 72 hours.  Thyroid Function Tests: Recent Labs    02/20/22 1235  TSH 0.801    Anemia Panel: No results for input(s): VITAMINB12, FOLATE, FERRITIN, TIBC, IRON, RETICCTPCT in the last 72 hours.  Urine analysis:    Component Value Date/Time   COLORURINE YELLOW 02/19/2022 [place] 02/19/2022 1650   LABSPEC 1.009  02/19/2022 1650   PHURINE 6.0 02/19/2022 1650   GLUCOSEU NEGATIVE 02/19/2022 1650   HGBUR NEGATIVE 02/19/2022 1650   BILIRUBINUR NEGATIVE 02/19/2022 1650   KETONESUR NEGATIVE 02/19/2022 1650   PROTEINUR 30 (A) 02/19/2022 1650   UROBILINOGEN 0.2 03/11/2012 2012   NITRITE NEGATIVE 02/19/2022 1650   LEUKOCYTESUR NEGATIVE 02/19/2022 1650    Sepsis Labs: Lactic Acid, Venous    Component Value Date/Time   LATICACIDVEN 1.6 02/19/2022 1649    MICROBIOLOGY: Recent Results (from the past 240 hour(s))  Resp Panel by RT-PCR (Flu A&B, Covid) Nasopharyngeal Swab     Status: None   Collection Time: 02/19/22  4:14 PM   Specimen: Nasopharyngeal Swab; Nasopharyngeal(NP) swabs in vial transport medium  Result Value Ref Range Status   SARS Coronavirus 2 by RT PCR NEGATIVE NEGATIVE Final    Comment: (NOTE) SARS-CoV-2 target nucleic acids are NOT DETECTED.  The SARS-CoV-2 RNA is generally detectable in upper respiratory specimens during the acute phase of infection. The lowest concentration of SARS-CoV-2 viral copies this assay can detect is 138 copies/mL. A negative result does not preclude SARS-Cov-2 infection and should not be used as the sole basis for treatment or other patient management decisions. A negative result may occur with  improper specimen collection/handling, submission of specimen other than nasopharyngeal swab, presence of viral mutation(s) within the areas targeted by this assay, and inadequate number of viral copies(<138 copies/mL). A negative result must be combined with clinical observations, patient history, and epidemiological information. The expected result is Negative.  Fact Sheet for Patients:  EntrepreneurPulse.com.au  Fact Sheet for Healthcare Providers:  IncredibleEmployment.be  This test is no t yet approved or cleared by the [place] FDA and  has been authorized for detection and/or diagnosis of SARS-CoV-2 by FDA  under an Emergency Use Authorization (EUA). This EUA will remain  in effect (meaning this test can be used) for the duration of the COVID-19 declaration under Section 564(b)(1) of the Act, 21 U.S.C.section 360bbb-3(b)(1), unless the authorization is terminated  or revoked sooner.       Influenza A by PCR NEGATIVE NEGATIVE Final   Influenza B by PCR NEGATIVE NEGATIVE Final    Comment: (NOTE) The Xpert Xpress SARS-CoV-2/FLU/RSV plus assay is intended as an aid in the diagnosis of influenza from Nasopharyngeal swab specimens and should not be used as a sole basis for treatment. Nasal washings and aspirates are unacceptable for Xpert Xpress SARS-CoV-2/FLU/RSV testing.  Fact Sheet for Patients: EntrepreneurPulse.com.au  Fact Sheet for Healthcare Providers: IncredibleEmployment.be  This test is not yet approved or cleared by the [place] FDA and has been authorized for detection and/or diagnosis of SARS-CoV-2 by FDA under an Emergency Use Authorization (EUA). This EUA will remain in effect (meaning this test can be used) for the duration of the COVID-19 declaration under Section 564(b)(1) of the Act, 21 U.S.C. section 360bbb-3(b)(1), unless the authorization is terminated or revoked.  Performed at [facility], [place] [address], [place], [place] [address]   Culture, blood (routine x 2)     Status: None (Preliminary result)   Collection Time: 02/19/22  4:14 PM   Specimen: BLOOD  Result Value Ref Range Status   Specimen Description BLOOD BLOOD RIGHT FOREARM  Final   Special Requests   Final    BOTTLES DRAWN AEROBIC AND ANAEROBIC Blood Culture adequate volume   Culture   Final    NO GROWTH 2 DAYS Performed at [facility], [place] [address] [place], [place] [address]    Report Status PENDING  Incomplete  Group A Strep by PCR     Status: None   Collection Time: 02/19/22  4:14 PM   Specimen: Nasopharyngeal Swab; Sterile Swab   Result Value Ref Range Status   Group A Strep by PCR NOT DETECTED NOT DETECTED Final    Comment: Performed at [facility], [place] [address] [place], [place] [address]  Urine Culture     Status: Abnormal   Collection Time: 02/19/22  4:50 PM   Specimen: Urine, Clean Catch  Result Value Ref Range Status   Specimen Description URINE, CLEAN CATCH  Final  Special Requests   Final    NONE Performed at [facility], [place] [address], [place], [place] [address]    Culture MULTIPLE SPECIES PRESENT, SUGGEST RECOLLECTION (A)  Final   Report Status 02/20/2022 FINAL  Final    RADIOLOGY STUDIES/RESULTS: CT Head Wo Contrast  Result Date: 02/20/2022 CLINICAL DATA:  Mental status change. EXAM: CT HEAD WITHOUT CONTRAST TECHNIQUE: Contiguous axial images were obtained from the base of the skull through the vertex without intravenous contrast. RADIATION DOSE REDUCTION: This exam was performed according to the departmental dose-optimization program which includes automated exposure control, adjustment of the mA and/or kV according to patient size and/or use of iterative reconstruction technique. COMPARISON:  Head CT dated January 24, 2022 FINDINGS: Brain: Chronic white matter ischemic change. No evidence of acute infarction, hemorrhage, hydrocephalus, extra-axial collection or mass lesion/mass effect. Vascular: No hyperdense vessel or unexpected calcification. Skull: Normal. Negative for fracture or focal lesion. Sinuses/Orbits: No acute finding. Other: None. IMPRESSION: No acute intracranial abnormality. Electronically Signed   By: Yetta Glassman M.D.   On: 02/20/2022 13:02   DG Chest Port 1 View  Result Date: 02/19/2022 CLINICAL DATA:  Altered mental status. EXAM: PORTABLE CHEST 1 VIEW COMPARISON:  01/24/2022 and prior radiographs FINDINGS: The cardiomediastinal silhouette is unremarkable. Mild elevation of the RIGHT hemidiaphragm again noted. There is no evidence of focal airspace disease, pulmonary  edema, suspicious pulmonary nodule/mass, pleural effusion, or pneumothorax. No acute bony abnormalities are identified. IMPRESSION: No active disease. Electronically Signed   By: Margarette Canada M.D.   On: 02/19/2022 16:34   EEG adult  Result Date: 02/21/2022 Lora Havens, MD     02/21/2022  8:41 AM Patient Name: Tim Bradley MRN: [id number] Epilepsy Attending: Lora Havens Referring Physician/Provider: Karmen Bongo, MD Date: 02/20/2022 Duration: 21.02 mins Patient history: 71 y.o. male PMHx it is not clear if he really had seizures but developed SDH.  EEG to evaluate for seizure Level of alertness: Awake AEDs during EEG study: Keppra Technical aspects: This EEG study was done with scalp electrodes positioned according to the 10-20 International system of electrode placement. Electrical activity was acquired at a sampling rate of 500Hz  and reviewed with a high frequency filter of 70Hz  and a low frequency filter of 1Hz . EEG data were recorded continuously and digitally stored. Description: The posterior dominant rhythm consists of 8-9 Hz activity of moderate voltage (25-35 uV) seen predominantly in posterior head regions, symmetric and reactive to eye opening and eye closing. EEG showed intermittent generalized 3 to 6 Hz theta-delta slowing. Hyperventilation and photic stimulation were not performed.   ABNORMALITY - Intermittent slow, generalized IMPRESSION: This study is suggestive of mild diffuse encephalopathy, nonspecific etiology. No seizures or epileptiform discharges were seen throughout the recording. Priyanka [name]     LOS: 0 days   Oren Binet, MD  [facility]    To contact the attending provider between 7A-7P or the covering provider during after hours 7P-7A, please log into the web site [email or web address] and access using universal [place] password for that web site. If you do not have the password, please call the hospital operator.  02/21/2022, 10:19 AM

## 2022-02-24 LAB — CULTURE, BLOOD (ROUTINE X 2)
Culture: NO GROWTH
Special Requests: ADEQUATE

## 2022-03-17 ENCOUNTER — Other Ambulatory Visit (HOSPITAL_COMMUNITY): Payer: Self-pay

## 2022-04-23 ENCOUNTER — Emergency Department (HOSPITAL_COMMUNITY)
Admission: EM | Admit: 2022-04-23 | Discharge: 2022-04-23 | Disposition: A | Discharge status: Home / Self Care | Payer: No Typology Code available for payment source | Attending: Emergency Medicine | Admitting: Emergency Medicine

## 2022-04-23 ENCOUNTER — Encounter (HOSPITAL_COMMUNITY): Payer: Self-pay

## 2022-04-23 ENCOUNTER — Emergency Department (HOSPITAL_COMMUNITY): Payer: No Typology Code available for payment source

## 2022-04-23 DIAGNOSIS — Z8546 Personal history of malignant neoplasm of prostate: Secondary | ICD-10-CM | POA: Diagnosis not present

## 2022-04-23 DIAGNOSIS — Z7901 Long term (current) use of anticoagulants: Secondary | ICD-10-CM | POA: Diagnosis not present

## 2022-04-23 DIAGNOSIS — Z20822 Contact with and (suspected) exposure to covid-19: Secondary | ICD-10-CM | POA: Diagnosis not present

## 2022-04-23 DIAGNOSIS — R7309 Other abnormal glucose: Secondary | ICD-10-CM | POA: Insufficient documentation

## 2022-04-23 DIAGNOSIS — R0602 Shortness of breath: Secondary | ICD-10-CM | POA: Diagnosis not present

## 2022-04-23 DIAGNOSIS — I509 Heart failure, unspecified: Secondary | ICD-10-CM | POA: Diagnosis not present

## 2022-04-23 DIAGNOSIS — R2243 Localized swelling, mass and lump, lower limb, bilateral: Secondary | ICD-10-CM | POA: Insufficient documentation

## 2022-04-23 DIAGNOSIS — I130 Hypertensive heart and chronic kidney disease with heart failure and stage 1 through stage 4 chronic kidney disease, or unspecified chronic kidney disease: Secondary | ICD-10-CM | POA: Diagnosis not present

## 2022-04-23 DIAGNOSIS — N189 Chronic kidney disease, unspecified: Secondary | ICD-10-CM | POA: Diagnosis not present

## 2022-04-23 DIAGNOSIS — R45851 Suicidal ideations: Secondary | ICD-10-CM | POA: Diagnosis not present

## 2022-04-23 DIAGNOSIS — E1122 Type 2 diabetes mellitus with diabetic chronic kidney disease: Secondary | ICD-10-CM | POA: Diagnosis not present

## 2022-04-23 LAB — SALICYLATE LEVEL: Salicylate Lvl: <7 mg/dL — ABNORMAL LOW (ref 7.0–30.0)

## 2022-04-23 LAB — I-STAT VENOUS BLOOD GAS, ED
Acid-base deficit: 1 mmol/L (ref 0.0–2.0)
Bicarbonate: 25.4 mmol/L (ref 20.0–28.0)
Calcium, Ion: 1.21 mmol/L (ref 1.15–1.40)
HCT: 36 % — ABNORMAL LOW (ref 39.0–52.0)
Hemoglobin: 12.2 g/dL — ABNORMAL LOW (ref 13.0–17.0)
O2 Saturation: 77 %
Potassium: 6.8 mmol/L (ref 3.5–5.1)
Sodium: 140 mmol/L (ref 135–145)
TCO2: 27 mmol/L (ref 22–32)
pCO2, Ven: 46.9 mmHg (ref 44–60)
pH, Ven: 7.341 (ref 7.25–7.43)
pO2, Ven: 44 mmHg (ref 32–45)

## 2022-04-23 LAB — COMPREHENSIVE METABOLIC PANEL
ALT: 12 U/L (ref 0–44)
AST: 28 U/L (ref 15–41)
Albumin: 3.2 g/dL — ABNORMAL LOW (ref 3.5–5.0)
Alkaline Phosphatase: 60 U/L (ref 38–126)
Anion gap: 4 — ABNORMAL LOW (ref 5–15)
BUN: 32 mg/dL — ABNORMAL HIGH (ref 8–23)
CO2: 23 mmol/L (ref 22–32)
Calcium: 8.9 mg/dL (ref 8.9–10.3)
Chloride: 113 mmol/L — ABNORMAL HIGH (ref 98–111)
Creatinine, Ser: 2.3 mg/dL — ABNORMAL HIGH (ref 0.61–1.24)
GFR, Estimated: 29 mL/min — ABNORMAL LOW (ref >60)
Glucose, Bld: 124 mg/dL — ABNORMAL HIGH (ref 70–99)
Potassium: 5.5 mmol/L — ABNORMAL HIGH (ref 3.5–5.1)
Sodium: 140 mmol/L (ref 135–145)
Total Bilirubin: 1.1 mg/dL (ref 0.3–1.2)
Total Protein: 6.6 g/dL (ref 6.5–8.1)

## 2022-04-23 LAB — CBG MONITORING, ED
Glucose-Capillary: 127 mg/dL — ABNORMAL HIGH (ref 70–99)
Glucose-Capillary: 94 mg/dL (ref 70–99)

## 2022-04-23 LAB — RAPID URINE DRUG SCREEN, HOSP PERFORMED
Amphetamines: NOT DETECTED
Barbiturates: NOT DETECTED
Benzodiazepines: NOT DETECTED
Cocaine: NOT DETECTED
Opiates: NOT DETECTED
Tetrahydrocannabinol: NOT DETECTED

## 2022-04-23 LAB — CBC WITH DIFFERENTIAL/PLATELET
Abs Immature Granulocytes: 0.03 10*3/uL (ref 0.00–0.07)
Basophils Absolute: 0.1 10*3/uL (ref 0.0–0.1)
Basophils Relative: 1 %
Eosinophils Absolute: 0.2 10*3/uL (ref 0.0–0.5)
Eosinophils Relative: 4 %
HCT: 35.8 % — ABNORMAL LOW (ref 39.0–52.0)
Hemoglobin: 11.5 g/dL — ABNORMAL LOW (ref 13.0–17.0)
Immature Granulocytes: 1 %
Lymphocytes Relative: 26 %
Lymphs Abs: 1.4 10*3/uL (ref 0.7–4.0)
MCH: 28.5 pg (ref 26.0–34.0)
MCHC: 32.1 g/dL (ref 30.0–36.0)
MCV: 88.8 fL (ref 80.0–100.0)
Monocytes Absolute: 0.7 10*3/uL (ref 0.1–1.0)
Monocytes Relative: 12 %
Neutro Abs: 3.2 10*3/uL (ref 1.7–7.7)
Neutrophils Relative %: 56 %
Platelets: 300 10*3/uL (ref 150–400)
RBC: 4.03 MIL/uL — ABNORMAL LOW (ref 4.22–5.81)
RDW: 14.6 % (ref 11.5–15.5)
WBC: 5.6 10*3/uL (ref 4.0–10.5)
nRBC: 0 % (ref 0.0–0.2)

## 2022-04-23 LAB — RESP PANEL BY RT-PCR (FLU A&B, COVID) ARPGX2
Influenza A by PCR: NEGATIVE
Influenza B by PCR: NEGATIVE
SARS Coronavirus 2 by RT PCR: NEGATIVE

## 2022-04-23 LAB — TROPONIN I (HIGH SENSITIVITY)
Troponin I (High Sensitivity): 12 ng/L (ref <18)
Troponin I (High Sensitivity): 13 ng/L (ref <18)

## 2022-04-23 LAB — MAGNESIUM: Magnesium: 1.9 mg/dL (ref 1.7–2.4)

## 2022-04-23 LAB — ACETAMINOPHEN LEVEL: Acetaminophen (Tylenol), Serum: <10 ug/mL — ABNORMAL LOW (ref 10–30)

## 2022-04-23 LAB — BRAIN NATRIURETIC PEPTIDE: B Natriuretic Peptide: 373.7 pg/mL — ABNORMAL HIGH (ref 0.0–100.0)

## 2022-04-23 LAB — ETHANOL: Alcohol, Ethyl (B): <10 mg/dL (ref <10)

## 2022-04-23 MED ORDER — HYDROCODONE-ACETAMINOPHEN 5-325 MG PO TABS
1.0000 | ORAL_TABLET | Freq: Two times a day (BID) | ORAL | Status: DC | PRN
  Administered 2022-04-23: 1 via ORAL
  Filled 2022-04-23: qty 1

## 2022-04-23 MED ORDER — FUROSEMIDE 20 MG PO TABS
40.0000 mg | ORAL_TABLET | Freq: Two times a day (BID) | ORAL | Status: DC
  Administered 2022-04-23: 40 mg via ORAL
  Filled 2022-04-23: qty 2

## 2022-04-23 MED ORDER — ATORVASTATIN CALCIUM 80 MG PO TABS
80.0000 mg | ORAL_TABLET | Freq: Every day | ORAL | Status: DC
  Administered 2022-04-23: 80 mg via ORAL
  Filled 2022-04-23: qty 1

## 2022-04-23 MED ORDER — INSULIN ASPART 100 UNIT/ML IJ SOLN: 0.0000 [IU] | Freq: Three times a day (TID) | INTRAMUSCULAR | Status: DC

## 2022-04-23 MED ORDER — LEVETIRACETAM 750 MG PO TABS
750.0000 mg | ORAL_TABLET | Freq: Two times a day (BID) | ORAL | Status: DC
  Administered 2022-04-23: 750 mg via ORAL
  Filled 2022-04-23: qty 1

## 2022-04-23 MED ORDER — INSULIN ASPART 100 UNIT/ML IJ SOLN: 0.0000 [IU] | Freq: Every day | INTRAMUSCULAR | Status: DC

## 2022-04-23 MED ORDER — CARVEDILOL 12.5 MG PO TABS
12.5000 mg | ORAL_TABLET | Freq: Two times a day (BID) | ORAL | Status: DC
  Administered 2022-04-23: 12.5 mg via ORAL
  Filled 2022-04-23: qty 1

## 2022-04-23 MED ORDER — FINASTERIDE 5 MG PO TABS
5.0000 mg | ORAL_TABLET | Freq: Every day | ORAL | Status: DC
  Administered 2022-04-23: 5 mg via ORAL
  Filled 2022-04-23: qty 1

## 2022-04-23 MED ORDER — APIXABAN 5 MG PO TABS
5.0000 mg | ORAL_TABLET | Freq: Two times a day (BID) | ORAL | Status: DC
  Administered 2022-04-23: 5 mg via ORAL
  Filled 2022-04-23: qty 1

## 2022-04-23 MED ORDER — LACOSAMIDE 50 MG PO TABS
100.0000 mg | ORAL_TABLET | Freq: Two times a day (BID) | ORAL | Status: DC
  Administered 2022-04-23: 100 mg via ORAL
  Filled 2022-04-23: qty 2

## 2022-04-23 MED ORDER — FUROSEMIDE 10 MG/ML IJ SOLN
60.0000 mg | Freq: Once | INTRAMUSCULAR | Status: AC
  Administered 2022-04-23: 60 mg via INTRAVENOUS
  Filled 2022-04-23: qty 6

## 2022-04-23 NOTE — ED Notes (Signed)
Portable Xray at bedside.

## 2022-04-23 NOTE — Discharge Instructions (Addendum)
It was a pleasure caring for you today in the emergency department.  Please return to the emergency department for any worsening or worrisome symptoms.  Please take your home medications as prescribed

## 2022-04-23 NOTE — ED Triage Notes (Signed)
Pt BIB [facility] from home C/O increasing SOB X 2 days. Pt reports hx CHF, recently increased Lasix. Per EMS, 91% RA, improved to 98% 2L [place].

## 2022-04-23 NOTE — BH Assessment (Signed)
TTS assessment completed. Discussed clinicals with the [facility] provider Beatriz Stallion, NP) and patient has been psych cleared. He denies SI, HI, and AVH's. Doesn't meet criteria for psychiatric hospitalization.   He reports that he doesn't have access to a gun. However, the [facility] provider would like this Clinician to reach out to his caregiver to insure that he doesn't have access to a firearm. Therefore, Clinician requested patient's nurse to assist with obtaining his caregivers information.

## 2022-04-23 NOTE — BH Assessment (Addendum)
Comprehensive Clinical Assessment (CCA) Note  04/23/2022 Tim Bradley [id number]  Disposition: TTS assessment completed. Discussed clinicals with the [facility] provider Tim Stallion, NP) and patient has been psych cleared. He denies SI, HI, and AVH's. Doesn't meet criteria for psychiatric hospitalization. Patient to follow up with his current providers at the [facility] for psychiatric management of his PTSD treatment. His sister confirms that he has a Teacher, music and therapist already in place with the [facility] located in [place], [place].   His sister Tim Bradley (sister) [phone] confirms that patient doesn't have access to a gun or any other means to harm himself. She feels that patient is safe to return home from a psychiatric stand point.     Chief Complaint:  Chief Complaint  Patient presents with   Shortness of Breath   Visit Diagnosis: Behavioral Disturbance   CCA Screening, Triage and Referral (STR)  Patient Reported Information How did you hear about [place]? Legal System  What Is the Reason for Your Visit/Call Today? Medical clearance and suicidal statement with a reported plan.   Per H&P:   "Tim Bradley is a 71 y.o. male.Patient presents for shortness of breath.  Medical history includes CHF, CKD, alcohol abuse, anemia, DM, HTN, seizures, and prostate cancer.  He is followed by [facility] for his prostate cancer.  He is currently not undergoing any systemic therapies.  He is followed by the [facility] for his other chronic comorbidities.  He reports that he recently increased his torsemide dose a week ago.  Over the past 2 to 3 days, he has had worsening shortness of breath.  Due to his worsening shortness of breath, EMS was called to his home.  Patient is not on supplemental oxygen at baseline.  SPO2 on room air was 88%.  He had increased work of breathing.  He was placed on supplemental oxygen by nasal cannula with improved SPO2.  Twelve-lead EKG with EMS showed LBBB.  Blood sugar was  normal.  Patient denies any chest pain or other areas of discomfort."   "In addition to his shortness of breath, patient also reports an episode of suicidal ideation with intent and plan last night.  He denies any prior history of suicidal ideation.  He reports the events of last night as follows: Patient has a caregiver at home who his sister has hired.  Yesterday, he got a honey bun and an oatmeal cookie.  He ate a honey bun and his caregiver found the oatmeal cookie.  She stated that he was not able to eat that.  This caused him to get upset.  He went to get his gun and had a plan to shoot himself.  He realized that his gun was not in his home because he had lent it to his nephew.  In addition to this episode last night, patient has felt emotional and tearful intermittently over the past 5 years."  TTS Assessment:  Clinician met with patient to complete a TTS assessment. Patient says that he has a 24/hr care giver that resides in his home with him. The caregiver has lived with him for the past 6 months. States that he has a caregiver to assist with "toileting, getting out of chair, makes my bed, and gives me my medication". He asked his caregiver to give him an Oatmeal cookie and she refused. Per patient, "She said that I couldn't have the Oatmeal cookie and told me "No" in a hateful manner". Patient says that this made him feel angry  and irritability.   He then tried to get out of his bed w/o assistance. States "I fell trying to get out of the bed, into my motorize wheelchair, so I became frustrated again".  Patient says that he typically gets along with his caregiver. According to patient, today was the first time they had any discord. Patient does not like being told what to do and feels that he should be able to make his own decisions. She has been his caregiver for 6 days and according to patient it's been an adjustment because his sister was previously his caregiver.     He denies current suicidal  ideations. However, acknowledges that he threatened suicide to his caregiver out of anger over being told "No" when he asked for an [employer]. He also acknowledges that he threatening to "blow his brains out if he had a gun". He does own a gun. However, says that it's in his nephew's possession. Patient makes a point in stating, "How can I hurt myself if I don't have a gun". He can contract for safety. Protective factors include: "My brother, my sister, my religion, and my 35 year old mama".   Denies history of depression and anxiety. Appetite is good. He reports gaining 9 pounds in 3-4 weeks. He sleeps 6 hrs at a time, wakes up to eat, take his medications, and sleeps 2-3 more hours.     Denies history of homicidal ideations. Denies history of aggressive and/or assaultive behaviors. Denies AVH's. Denies feelings of paranoia. No current alcohol/drug use. However, has used THC and cocaine was in 1981.  He does not have a therapist/psychiatrist. However, does have a medical provider at the [place]. Denies that he is taking any psychiatric medications.     He is single, widowed. Spouse died approximately 2.5 years ago from COPD. He was married 44 years. No children. Raised by parents and has 11 siblings. Siblings and nephew are supportive. 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 No history of trauma and/or abuse.  Denies a family history of mental health illnesses.  Collateral:  Pt provided Tim Bradley (sister) [phone], contact information and gave verbal consent to speak with her.. Clinician successfully contacted his sister. She does not feel that her brother is a danger to self and/or others. States that Tim Bradley is "spoiled" and when he doesn't get his way.Marland KitchenMarland KitchenMarland KitchenMarland Kitchenhe gets angry....has made suicidal statements out of anger in the past. He typically makes comments when he can't get "sweets" or he is given cigarettes to smoke. His sister believes that this is attention seeking behaviors because  she hired a caregiver 6 days ago. States that the caregiver is doing what she is suppose to do when he is asking for "sweets". [name] says that patient has diabetes and his sugar intake must be monitored. States that patient will try to get into his motorized wheelchair and make attempts to go to the store. She tries to tell him this is dangerous but he insist on going anyway. According to her this has triggered a lot of arguments. She also denies that patient has access to anything he could use to hurt him, including a firearms.   How Long Has This Been Causing You Problems? 1-6 months  What Do You Feel Would Help You the Most Today? Treatment for Depression or other mood problem   Have You Recently Had Any Thoughts About Hurting Yourself? No  Are You Planning to Commit Suicide/Harm Yourself At This time? No   Have you  Recently Had Thoughts About [place]? Yes  Are You Planning to Harm Someone at This Time? No  Explanation: No data recorded  Have You Used Any Alcohol or Drugs in the Past 24 Hours? No  How Long Ago Did You Use Drugs or Alcohol? No data recorded What Did You Use and How Much? No data recorded  Do You Currently Have a Therapist/Psychiatrist? No  Name of Therapist/Psychiatrist: No data recorded  Have You Been Recently Discharged From Any Office Practice or Programs? No  Explanation of Discharge From Practice/Program: No data recorded    CCA Screening Triage Referral Assessment Type of Contact: Tele-Assessment  Telemedicine Service Delivery: Telemedicine service delivery: This service was provided via telemedicine using a 2-way, interactive audio and video technology  Is this Initial or Reassessment? Initial Assessment  Date Telepsych consult ordered in [facility]:  04/23/22  Time Telepsych consult ordered in [facility]:  No data recorded Location of Assessment: [facility] ED  Provider Location: [facility] Assessment Services   Collateral Involvement: Pt provided Tim Bradley (sister) [phone], contact information. Clinician contacted his sister. She does not feel that her brother is a danger to self and/or others. States that Mr. Sanzone is "spoiled" and when he doesn't get his way.Marland KitchenMarland KitchenMarland KitchenMarland Kitchenhe gets angry....has made suicidal statements out of anger. He typically makes comments when he can't get "sweets" or given cigarettes to smoke. His sister believes that this is attention seeking behaviors because she hired a caregiver. She also denies that patient has access to anything he could use to hurt him, including a firearm.   Does Patient Have a Stage manager Guardian? No data recorded Name and Contact of Legal Guardian: No data recorded If Minor and Not Living with Parent(s), Who has Custody? No data recorded Is CPS involved or ever been involved? Never  Is APS involved or ever been involved? Never   Patient Determined To Be At Risk for Harm To Self or Others Based on Review of Patient Reported Information or Presenting Complaint? No  Method: No data recorded Availability of Means: No data recorded Intent: No data recorded Notification Required: No data recorded Additional Information for Danger to Others Potential: No data recorded Additional Comments for Danger to Others Potential: No data recorded Are There Guns or Other Weapons in Your Home? No data recorded Types of Guns/Weapons: No data recorded Are These Weapons Safely Secured?                            No data recorded Who Could Verify You Are Able To Have These Secured: No data recorded Do You Have any Outstanding Charges, Pending Court Dates, Parole/Probation? No data recorded Contacted To Inform of Risk of Harm To Self or Others: Law Enforcement    Does Patient Present under Involuntary Commitment? No data recorded IVC Papers Initial File Date: No data recorded  [place] of Residence: [place]   Patient Currently Receiving the Following Services: Individual Therapy   Determination  of Need: Urgent (48 hours)   Options For Referral: Outpatient Therapy; Medication Management     CCA Biopsychosocial Patient Reported Schizophrenia/Schizoaffective Diagnosis in Past: No data recorded  Strengths: No data recorded  Mental Health Symptoms Depression:  No data recorded  Duration of Depressive symptoms:    Mania:  No data recorded  Anxiety:   No data recorded  Psychosis:  No data recorded  Duration of Psychotic symptoms:    Trauma:  No data recorded  Obsessions:  No data recorded  Compulsions:  No data recorded  Inattention:  No data recorded  Hyperactivity/Impulsivity:  No data recorded  Oppositional/Defiant Behaviors:  No data recorded  Emotional Irregularity:  No data recorded  Other Mood/Personality Symptoms:  No data recorded   Mental Status Exam Appearance and self-care  Stature:  No data recorded  Weight:  No data recorded  Clothing:  No data recorded  Grooming:  No data recorded  Cosmetic use:  No data recorded  Posture/gait:  No data recorded  Motor activity:  No data recorded  Sensorium  Attention:  No data recorded  Concentration:  No data recorded  Orientation:  No data recorded  Recall/memory:  No data recorded  Affect and Mood  Affect:  No data recorded  Mood:  No data recorded  Relating  Eye contact:  No data recorded  Facial expression:  No data recorded  Attitude toward examiner:  No data recorded  Thought and Language  Speech flow: No data recorded  Thought content:  No data recorded  Preoccupation:  No data recorded  Hallucinations:  No data recorded  Organization:  No data recorded  [employer] of Knowledge:  No data recorded  Intelligence:  No data recorded  Abstraction:  No data recorded  Judgement:  No data recorded  Reality Testing:  No data recorded  Insight:  No data recorded  Decision Making:  No data recorded  Social Functioning  Social Maturity:  No data recorded  Social Judgement:  No data  recorded  Stress  Stressors:  No data recorded  Coping Ability:  No data recorded  Skill Deficits:  No data recorded  Supports:  No data recorded    Religion:    Leisure/Recreation:    Exercise/Diet:     CCA Employment/Education Employment/Work Situation: Employment / Work Situation Employment Situation: On disability Why is Patient on Disability: UTA How Long has Patient Been on Disability: UTA Patient's Job has Been Impacted by Current Illness: No Has Patient ever Been in the [employer]?: Yes (Describe in comment) Education officer, community) Did You Receive Any Psychiatric Treatment/Services While in the [employer]?: No  Education: Education Is Patient Currently Attending School?: No Did Physicist, medical?: No Did You Have An Individualized Education Program (IIEP): No Did You Have Any Difficulty At School?: No Patient's Education Has Been Impacted by Current Illness: No   CCA Family/Childhood History Family and Relationship History: Family history Marital status: Widowed Widowed, when?: 2020 Does patient have children?: No  Childhood History:  Childhood History By whom was/is the patient raised?: Both parents Did patient suffer any verbal/emotional/physical/sexual abuse as a child?: No Did patient suffer from severe childhood neglect?: No Has patient ever been sexually abused/assaulted/raped as an adolescent or adult?: No Was the patient ever a victim of a crime or a disaster?: No Witnessed domestic violence?: No Has patient been affected by domestic violence as an adult?: No  Child/Adolescent Assessment:     CCA Substance Use Alcohol/Drug Use: Alcohol / Drug Use Pain Medications: See MRA Prescriptions: See MRA Over the Counter: See MRA History of alcohol / drug use?: Yes Longest period of sobriety (when/how long): No current alcohol/drug use. However, has used THC and cocaine was in 1981. Negative Consequences of Use:  (None reported)                          ASAM's:  Six Dimensions of Multidimensional Assessment  Dimension 1:  Acute Intoxication  and/or Withdrawal Potential:      Dimension 2:  Biomedical Conditions and Complications:      Dimension 3:  Emotional, Behavioral, or Cognitive Conditions and Complications:     Dimension 4:  Readiness to Change:     Dimension 5:  Relapse, Continued use, or Continued Problem Potential:     Dimension 6:  Recovery/Living Environment:     ASAM Severity Score:    ASAM Recommended Level of Treatment:     Substance use Disorder (SUD)    Recommendations for Services/Supports/Treatments: Recommendations for Services/Supports/Treatments Recommendations For Services/Supports/Treatments: Individual Therapy, Medication Management  Discharge Disposition:    DSM5 Diagnoses: Patient Active Problem List   Diagnosis Date Noted   Recurrent seizures ([place]) 02/20/2022   E coli bacteremia 01/29/2022   Situational anxiety 01/26/2022   Physical deconditioning 01/26/2022   CKD (chronic kidney disease) stage 4, GFR 15-29 ml/min ([place]) 01/25/2022   Anemia of chronic disease 01/25/2022   Sepsis secondary to UTI ([place]) 01/24/2022   Acute cystitis 01/24/2022   Class 1 obesity 12/22/2021   Acute on chronic systolic heart failure ([place]) 12/21/2021   DNR (do not resuscitate) 12/21/2021   DVT (deep venous thrombosis) ([place]) 12/21/2021   Substance induced mood disorder ([place]) 10/27/2020   Right leg DVT ([place]) 09/30/2020   Seizure disorder ([place]) 96/43/8381   Chronic systolic CHF (congestive heart failure) ([place]) 09/28/2020   AKI (acute kidney injury) ([place]) 09/28/2020   Pressure injury of skin 05/03/2020   Acute encephalopathy 05/02/2020   Diarrhea 05/02/2020   Acute combined systolic and diastolic congestive heart failure ([place]) 04/22/2020   Essential hypertension 04/11/2020   Leg edema 04/11/2020   Dyspnea 04/11/2020   Acute on chronic congestive heart failure ([place]) 04/11/2020   BPH (benign prostatic hyperplasia)  04/11/2020   Acute kidney injury superimposed on chronic kidney disease ([place]) 04/11/2020   Generalized weakness 04/11/2020   Seizure ([place]) 07/21/2019   Cocaine abuse ([place])    Constipation 08/22/2018   Gastritis 08/20/2018   Heme positive stool    Acute blood loss anemia secondary to massive gastric ulcer    Hyponatremia    Abscess, gluteal, left 03/26/2017   Type 2 diabetes mellitus with hyperlipidemia ([place]) 03/26/2017   Adrenal nodule ([place]) 03/26/2017   Closed fracture of body of thoracic vertebra ([place]) 03/26/2017   Spinal stenosis of lumbar region 03/26/2017     Referrals to Alternative Service(s): Referred to Alternative Service(s):   Place:   Date:   Time:    Referred to Alternative Service(s):   Place:   Date:   Time:    Referred to Alternative Service(s):   Place:   Date:   Time:    Referred to Alternative Service(s):   Place:   Date:   Time:     Waldon Merl, Counselor

## 2022-04-23 NOTE — ED Provider Notes (Signed)
[facility] EMERGENCY DEPARTMENT Provider Note   CSN: [id number] Arrival date & time: 04/23/22  9323     History  Chief Complaint  Patient presents with   Shortness of Breath    Tim Bradley is a 71 y.o. male.  HPI Patient presents for shortness of breath.  Medical history includes CHF, CKD, alcohol abuse, anemia, DM, HTN, seizures, and prostate cancer.  He is followed by [facility] for his prostate cancer.  He is currently not undergoing any systemic therapies.  He is followed by the [facility] for his other chronic comorbidities.  He reports that he recently increased his torsemide dose a week ago.  Over the past 2 to 3 days, he has had worsening shortness of breath.  Due to his worsening shortness of breath, EMS was called to his home.  Patient is not on supplemental oxygen at baseline.  SPO2 on room air was 88%.  He had increased work of breathing.  He was placed on supplemental oxygen by nasal cannula with improved SPO2.  Twelve-lead EKG with EMS showed LBBB.  Blood sugar was normal.  Patient denies any chest pain or other areas of discomfort.  In addition to his shortness of breath, patient also reports an episode of suicidal ideation with intent and plan last night.  He denies any prior history of suicidal ideation.  He reports the events of last night as follows: Patient has a caregiver at home who his sister has hired.  Yesterday, he got a honey bun and an oatmeal cookie.  He ate a honey bun and his caregiver found the oatmeal cookie.  She stated that he was not able to eat that.  This caused him to get upset.  He went to get his gun and had a plan to shoot himself.  He realized that his gun was not in his home because he had lent it to his nephew.  In addition to this episode last night, patient has felt emotional and tearful intermittently over the past 5 days.  He is not sure why.  He denies any acute stressors.    Home Medications Prior to Admission medications    Medication Sig Start Date End Date Taking? Authorizing Provider  acetaminophen (TYLENOL) 500 MG tablet Take 1,000 mg by mouth every 6 (six) hours as needed for moderate pain or headache. Patient not taking: Reported on 02/20/2022    [provider]  apixaban (ELIQUIS) 5 MG TABS tablet Take 1 tablet (5 mg total) by mouth 2 (two) times daily. Patient not taking: Reported on 01/24/2022 01/10/22   Horton, Alvin Critchley, DO  atorvastatin (LIPITOR) 80 MG tablet Take 1 tablet (80 mg total) by mouth daily. Patient not taking: Reported on 01/24/2022 01/10/22   Horton, Drue Dun M, DO  carvedilol (COREG) 6.25 MG tablet Take 1 tablet (6.25 mg total) by mouth 2 (two) times daily with a meal. 01/10/22   Horton, Alvin Critchley, DO  Cholecalciferol (QC VITAMIN D3) 50 MCG (2000 UT) TABS Take 2,000 Units by mouth daily.    [provider]  levETIRAcetam (KEPPRA) 750 MG tablet Take 1 tablet (750 mg total) by mouth 2 (two) times daily. 02/21/22   Ghimire, Henreitta Leber, MD  Magnesium Oxide (MAG-OXIDE) 200 MG TABS Take 200 mg by mouth daily.    [provider]  mometasone-formoterol (DULERA) 200-5 MCG/ACT AERO Inhale 2 puffs into the lungs 2 (two) times daily. 01/31/22 03/02/22  Darliss Cheney, MD  Naphazoline-Pheniramine (VISINE-A OP) Place 1 drop  into both eyes daily as needed (dry/itchy eyes). Patient not taking: Reported on 02/20/2022    [provider]  torsemide (DEMADEX) 100 MG tablet Take 100 mg by mouth daily.    [provider]      Allergies    Trazodone and nefazodone    Review of Systems   Review of Systems  Constitutional:  Positive for fatigue.  Respiratory:  Positive for shortness of breath.   Cardiovascular:  Positive for leg swelling.  Psychiatric/Behavioral:  Positive for suicidal ideas.   All other systems reviewed and are negative.   Physical Exam Updated Vital Signs BP (!) 167/89   Pulse 64   Temp 98.5 F (36.9 C) (Oral)   Resp (!) 27   Ht 6\' 2"  (1.88 m)   Wt  122.5 kg   SpO2 98%   BMI 34.67 kg/m  Physical Exam Vitals and nursing note reviewed.  Constitutional:      General: He is not in acute distress.    Appearance: Normal appearance. He is well-developed. He is not toxic-appearing or diaphoretic.  HENT:     Head: Normocephalic and atraumatic.     Right Ear: External ear normal.     Left Ear: External ear normal.     Nose: Nose normal.     Mouth/Throat:     Mouth: Mucous membranes are moist.     Pharynx: Oropharynx is clear.  Eyes:     Extraocular Movements: Extraocular movements intact.     Conjunctiva/sclera: Conjunctivae normal.  Cardiovascular:     Rate and Rhythm: Normal rate and regular rhythm.     Heart sounds: No murmur heard. Pulmonary:     Effort: No respiratory distress.     Breath sounds: Normal breath sounds. No wheezing, rhonchi or rales.     Comments: Mildly increased work of breathing.  Currently on 3 L of supplemental oxygen by nasal cannula. Chest:     Chest wall: No tenderness.  Abdominal:     Palpations: Abdomen is soft.     Tenderness: There is no abdominal tenderness.  Musculoskeletal:        General: No swelling. Normal range of motion.     Cervical back: Normal range of motion and neck supple. No rigidity.     Right lower leg: Edema present.     Left lower leg: Edema present.  Skin:    General: Skin is warm and dry.     Capillary Refill: Capillary refill takes less than 2 seconds.     Coloration: Skin is not jaundiced or pale.  Neurological:     General: No focal deficit present.     Mental Status: He is alert and oriented to person, place, and time.     Cranial Nerves: No cranial nerve deficit.     Sensory: No sensory deficit.     Motor: No weakness.     Coordination: Coordination normal.  Psychiatric:        Mood and Affect: Mood and affect normal.        Speech: Speech normal.        Behavior: Behavior normal. Behavior is cooperative.        Thought Content: Thought content includes suicidal  ideation. Thought content includes suicidal plan.        Judgment: Judgment normal.     ED Results / Procedures / Treatments   Labs (all labs ordered are listed, but only abnormal results are displayed) Labs Reviewed  COMPREHENSIVE METABOLIC PANEL - Abnormal;  Notable for the following components:      Result Value   Potassium 5.5 (*)    Chloride 113 (*)    Glucose, Bld 124 (*)    BUN 32 (*)    Creatinine, Ser 2.30 (*)    Albumin 3.2 (*)    GFR, Estimated 29 (*)    Anion gap 4 (*)    All other components within normal limits  CBC WITH DIFFERENTIAL/PLATELET - Abnormal; Notable for the following components:   RBC 4.03 (*)    Hemoglobin 11.5 (*)    HCT 35.8 (*)    All other components within normal limits  BRAIN NATRIURETIC PEPTIDE - Abnormal; Notable for the following components:   B Natriuretic Peptide 373.7 (*)    All other components within normal limits  SALICYLATE LEVEL - Abnormal; Notable for the following components:   Salicylate Lvl <6.7 (*)    All other components within normal limits  ACETAMINOPHEN LEVEL - Abnormal; Notable for the following components:   Acetaminophen (Tylenol), Serum <10 (*)    All other components within normal limits  I-STAT VENOUS BLOOD GAS, ED - Abnormal; Notable for the following components:   Potassium 6.8 (*)    HCT 36.0 (*)    Hemoglobin 12.2 (*)    All other components within normal limits  RESP PANEL BY RT-PCR (FLU A&B, COVID) ARPGX2  MAGNESIUM  ETHANOL  RAPID URINE DRUG SCREEN, HOSP PERFORMED  I-STAT CHEM 8, ED  TROPONIN I (HIGH SENSITIVITY)  TROPONIN I (HIGH SENSITIVITY)    EKG EKG Interpretation  Date/Time:  Wednesday April 23 2022 08:37:14 EDT Ventricular Rate:  69 PR Interval:  167 QRS Duration: 169 QT Interval:  470 QTC Calculation: 504 R Axis:   -52 Text Interpretation: Sinus rhythm Probable left atrial enlargement Left bundle branch block Confirmed by Godfrey Pick [phone]) on 04/23/2022 9:08:58 AM  Radiology DG Chest  Port 1 View  Result Date: 04/23/2022 CLINICAL DATA:  Shortness of breath.  History of CHF. EXAM: PORTABLE CHEST 1 VIEW COMPARISON:  02/19/2022 FINDINGS: The heart is borderline enlarged but stable. Left ventricular configuration. Stable tortuosity and calcification of the thoracic aorta. The lungs are clear of an acute process. No infiltrates, edema or effusions. No pneumothorax. The bony thorax is intact. IMPRESSION: No acute cardiopulmonary findings. Electronically Signed   By: Marijo Sanes M.D.   On: 04/23/2022 08:52    Procedures Procedures    Medications Ordered in ED Medications  furosemide (LASIX) injection 60 mg (60 mg Intravenous Given 04/23/22 1103)    ED Course/ Medical Decision Making/ A&P                           Medical Decision Making Amount and/or Complexity of Data Reviewed Labs: ordered. Radiology: ordered.  Risk Prescription drug management.   This patient presents to the ED for concern of shortness of breath, this involves an extensive number of treatment options, and is a complaint that carries with it a high risk of complications and morbidity.  The differential diagnosis includes cardiogenic pulmonary edema, CHF exacerbation, COPD, URI, pneumonia, ACS   Co morbidities that complicate the patient evaluation  CHF, CKD, alcohol abuse, anemia, DM, HTN, seizures, and prostate cancer   Additional history obtained:  Additional history obtained from N/A External records from outside source obtained and reviewed including EMR   Lab Tests:  I Ordered, and personally interpreted labs.  The pertinent results include: Mild hyperkalemia, creatinine slightly improved  from baseline, mild elevation in BNP, baseline anemia, no leukocytosis, normal troponin   Imaging Studies ordered:  I ordered imaging studies including chest x-ray I independently visualized and interpreted imaging which showed no acute findings I agree with the radiologist  interpretation   Cardiac Monitoring: / EKG:  The patient was maintained on a cardiac monitor.  I personally viewed and interpreted the cardiac monitored which showed an underlying rhythm of: Sinus rhythm   Consultations Obtained:  I requested consultation with the TTS,  and discussed lab and imaging findings as well as pertinent plan - they recommend: (recommendations pending evaluation)   Problem List / ED Course / Critical interventions / Medication management  Patient is a 71 year old male presenting for 2 to 3 days of worsening shortness of breath.  He does take torsemide at home.  He has had a recent dose increase a week ago.  Despite that, he has persistent leg swelling and has had this worsening shortness of breath.  He denies any associated chest pain.  Patient arrives via EMS.  EMS did note hypoxia of 88% on room air.  He is not on home oxygen.  He was placed on a nasal cannula.  On arrival, he has mildly increased work of breathing.  Lungs are clear to auscultation.  Laboratory work-up was initiated.  Chest x-ray was obtained which did not show any focal opacities or significant pulmonary edema.  Laboratory work-up was notable for mild hypokalemia.  Patient was given a 60 mg dose of IV Lasix.  He subsequently had 1.5 L of urine output and, with this, had resolution of his increased work of breathing.  He was taken off of supplemental oxygen.  He was able to maintain normal SPO2 on room air.  He does feel that his breathing is back to baseline.  Patient is medically cleared at this time.  Medication reconciliation ordered prior to reordering of home medications. In addition to shortness of breath, patient also describes an episode of suicidal ideation with plan and intent yesterday and a 4 to 5 days of intermittent tearfulness.  TTS consult was placed.  Recommendations to follow. I ordered medication including Lasix for diuresis Reevaluation of the patient after these medicines showed that  the patient resolved I have reviewed the patients home medicines and have made adjustments as needed   Social Determinants of Health:  Has access to outpatient care through the [place], requires home health care         Final Clinical Impression(s) / ED Diagnoses Final diagnoses:  Shortness of breath  Chronic congestive heart failure, unspecified heart failure type ([place])  Suicidal ideation    Rx / DC Orders ED Discharge Orders     None         Godfrey Pick, MD 04/23/22 1414

## 2022-04-23 NOTE — ED Notes (Signed)
When asked suicide risk assessment questions, pt reports he was planning to "blow his brains out" last night, but his nephew took his firearm from him. MD notified.

## 2022-04-24 ENCOUNTER — Emergency Department (HOSPITAL_COMMUNITY): Payer: No Typology Code available for payment source

## 2022-04-24 ENCOUNTER — Inpatient Hospital Stay (HOSPITAL_COMMUNITY)
Admission: EM | Admit: 2022-04-24 | Discharge: 2022-05-01 | DRG: 291 | Disposition: A | Discharge status: Skilled Nursing Facility | Payer: No Typology Code available for payment source | Attending: Internal Medicine | Admitting: Internal Medicine

## 2022-04-24 DIAGNOSIS — I130 Hypertensive heart and chronic kidney disease with heart failure and stage 1 through stage 4 chronic kidney disease, or unspecified chronic kidney disease: Principal | ICD-10-CM | POA: Diagnosis present

## 2022-04-24 DIAGNOSIS — I10 Essential (primary) hypertension: Secondary | ICD-10-CM | POA: Diagnosis present

## 2022-04-24 DIAGNOSIS — I5023 Acute on chronic systolic (congestive) heart failure: Secondary | ICD-10-CM | POA: Diagnosis present

## 2022-04-24 DIAGNOSIS — Z7901 Long term (current) use of anticoagulants: Secondary | ICD-10-CM

## 2022-04-24 DIAGNOSIS — N179 Acute kidney failure, unspecified: Secondary | ICD-10-CM | POA: Diagnosis present

## 2022-04-24 DIAGNOSIS — Z87891 Personal history of nicotine dependence: Secondary | ICD-10-CM

## 2022-04-24 DIAGNOSIS — N184 Chronic kidney disease, stage 4 (severe): Secondary | ICD-10-CM | POA: Diagnosis present

## 2022-04-24 DIAGNOSIS — J9601 Acute respiratory failure with hypoxia: Secondary | ICD-10-CM | POA: Diagnosis present

## 2022-04-24 DIAGNOSIS — I82511 Chronic embolism and thrombosis of right femoral vein: Secondary | ICD-10-CM | POA: Diagnosis present

## 2022-04-24 DIAGNOSIS — N400 Enlarged prostate without lower urinary tract symptoms: Secondary | ICD-10-CM | POA: Diagnosis present

## 2022-04-24 DIAGNOSIS — E1122 Type 2 diabetes mellitus with diabetic chronic kidney disease: Secondary | ICD-10-CM | POA: Diagnosis present

## 2022-04-24 DIAGNOSIS — I82409 Acute embolism and thrombosis of unspecified deep veins of unspecified lower extremity: Secondary | ICD-10-CM | POA: Diagnosis present

## 2022-04-24 DIAGNOSIS — E669 Obesity, unspecified: Secondary | ICD-10-CM | POA: Diagnosis present

## 2022-04-24 DIAGNOSIS — G4733 Obstructive sleep apnea (adult) (pediatric): Secondary | ICD-10-CM | POA: Diagnosis present

## 2022-04-24 DIAGNOSIS — I447 Left bundle-branch block, unspecified: Secondary | ICD-10-CM | POA: Diagnosis present

## 2022-04-24 DIAGNOSIS — Z8249 Family history of ischemic heart disease and other diseases of the circulatory system: Secondary | ICD-10-CM

## 2022-04-24 DIAGNOSIS — K219 Gastro-esophageal reflux disease without esophagitis: Secondary | ICD-10-CM | POA: Diagnosis present

## 2022-04-24 DIAGNOSIS — Z6835 Body mass index (BMI) 35.0-35.9, adult: Secondary | ICD-10-CM

## 2022-04-24 DIAGNOSIS — R45851 Suicidal ideations: Secondary | ICD-10-CM | POA: Diagnosis present

## 2022-04-24 DIAGNOSIS — Z8546 Personal history of malignant neoplasm of prostate: Secondary | ICD-10-CM

## 2022-04-24 DIAGNOSIS — G40909 Epilepsy, unspecified, not intractable, without status epilepticus: Secondary | ICD-10-CM

## 2022-04-24 DIAGNOSIS — E785 Hyperlipidemia, unspecified: Secondary | ICD-10-CM | POA: Diagnosis present

## 2022-04-24 DIAGNOSIS — Z993 Dependence on wheelchair: Secondary | ICD-10-CM

## 2022-04-24 DIAGNOSIS — E1165 Type 2 diabetes mellitus with hyperglycemia: Secondary | ICD-10-CM | POA: Diagnosis present

## 2022-04-24 DIAGNOSIS — Z79899 Other long term (current) drug therapy: Secondary | ICD-10-CM

## 2022-04-24 DIAGNOSIS — I252 Old myocardial infarction: Secondary | ICD-10-CM

## 2022-04-24 DIAGNOSIS — E1169 Type 2 diabetes mellitus with other specified complication: Secondary | ICD-10-CM | POA: Diagnosis present

## 2022-04-24 DIAGNOSIS — E875 Hyperkalemia: Secondary | ICD-10-CM | POA: Diagnosis present

## 2022-04-24 DIAGNOSIS — Z885 Allergy status to narcotic agent status: Secondary | ICD-10-CM

## 2022-04-24 DIAGNOSIS — Z888 Allergy status to other drugs, medicaments and biological substances status: Secondary | ICD-10-CM

## 2022-04-24 DIAGNOSIS — N189 Chronic kidney disease, unspecified: Secondary | ICD-10-CM | POA: Diagnosis present

## 2022-04-24 LAB — COMPREHENSIVE METABOLIC PANEL
ALT: 15 U/L (ref 0–44)
AST: 18 U/L (ref 15–41)
Albumin: 3.3 g/dL — ABNORMAL LOW (ref 3.5–5.0)
Alkaline Phosphatase: 68 U/L (ref 38–126)
Anion gap: 8 (ref 5–15)
BUN: 45 mg/dL — ABNORMAL HIGH (ref 8–23)
CO2: 20 mmol/L — ABNORMAL LOW (ref 22–32)
Calcium: 9.1 mg/dL (ref 8.9–10.3)
Chloride: 113 mmol/L — ABNORMAL HIGH (ref 98–111)
Creatinine, Ser: 2.83 mg/dL — ABNORMAL HIGH (ref 0.61–1.24)
GFR, Estimated: 23 mL/min — ABNORMAL LOW (ref >60)
Glucose, Bld: 146 mg/dL — ABNORMAL HIGH (ref 70–99)
Potassium: 4.8 mmol/L (ref 3.5–5.1)
Sodium: 141 mmol/L (ref 135–145)
Total Bilirubin: 0.5 mg/dL (ref 0.3–1.2)
Total Protein: 6.5 g/dL (ref 6.5–8.1)

## 2022-04-24 LAB — CBC WITH DIFFERENTIAL/PLATELET
Abs Immature Granulocytes: 0.01 10*3/uL (ref 0.00–0.07)
Basophils Absolute: 0 10*3/uL (ref 0.0–0.1)
Basophils Relative: 1 %
Eosinophils Absolute: 0.2 10*3/uL (ref 0.0–0.5)
Eosinophils Relative: 3 %
HCT: 34.7 % — ABNORMAL LOW (ref 39.0–52.0)
Hemoglobin: 11 g/dL — ABNORMAL LOW (ref 13.0–17.0)
Immature Granulocytes: 0 %
Lymphocytes Relative: 25 %
Lymphs Abs: 1.3 10*3/uL (ref 0.7–4.0)
MCH: 27.9 pg (ref 26.0–34.0)
MCHC: 31.7 g/dL (ref 30.0–36.0)
MCV: 88.1 fL (ref 80.0–100.0)
Monocytes Absolute: 0.4 10*3/uL (ref 0.1–1.0)
Monocytes Relative: 8 %
Neutro Abs: 3.3 10*3/uL (ref 1.7–7.7)
Neutrophils Relative %: 63 %
Platelets: 241 10*3/uL (ref 150–400)
RBC: 3.94 MIL/uL — ABNORMAL LOW (ref 4.22–5.81)
RDW: 14.3 % (ref 11.5–15.5)
WBC: 5.3 10*3/uL (ref 4.0–10.5)
nRBC: 0 % (ref 0.0–0.2)

## 2022-04-24 LAB — BRAIN NATRIURETIC PEPTIDE: B Natriuretic Peptide: 257.2 pg/mL — ABNORMAL HIGH (ref 0.0–100.0)

## 2022-04-24 LAB — TROPONIN I (HIGH SENSITIVITY)
Troponin I (High Sensitivity): 11 ng/L (ref <18)
Troponin I (High Sensitivity): 10 ng/L (ref <18)

## 2022-04-24 MED ORDER — FUROSEMIDE 10 MG/ML IJ SOLN
60.0000 mg | Freq: Once | INTRAMUSCULAR | Status: AC
  Administered 2022-04-24: 60 mg via INTRAVENOUS
  Filled 2022-04-24: qty 6

## 2022-04-24 NOTE — ED Triage Notes (Signed)
Patient BIB EMS from home c/o shortness of breath x2 days. ER visit, dc with increased lasix. Hx of CHF

## 2022-04-24 NOTE — ED Provider Notes (Signed)
Emergency Department Provider Note   I have reviewed the triage vital signs and the nursing notes.   HISTORY  Chief Complaint Shortness of Breath   HPI Tim Bradley is a 71 y.o. male with past history of CKD, CHF, polysubstance abuse, diabetes presents to the emergency department with shortness of breath.  He was seen yesterday with trouble breathing and given Lasix with good response and ultimately discharged after DDS evaluation.  He had an episode yesterday where he was having some transient suicidal thoughts which resolved and he was cleared by psychiatry for discharge home.  He states those thoughts have not returned but he did develop increased shortness of breath especially with lying flat.  No known history of OSA.  Denies chest pain or tightness.  No fevers or chills.  He has noticed swelling in his legs along with some unintentional weight gain. He is not on home O2.    Past Medical History:  Diagnosis Date   Abscess 02/2017   LEFT GLUTEAL    Acid reflux    Alcohol abuse    Anemia    CHF (congestive heart failure) (HCC)    CKD (chronic kidney disease) stage 4, GFR 15-29 ml/min (HCC) 04/11/2020   Cocaine abuse (HCC)    DDD (degenerative disc disease), lumbar    Diabetes mellitus without complication (HCC)    Dyspnea    Enlarged prostate    Hypertension    Seizures (HCC)    Spinal stenosis, lumbar     Review of Systems  Constitutional: No fever/chills Eyes: No visual changes. ENT: No sore throat. Cardiovascular: Denies chest pain. Respiratory: Positive shortness of breath. Gastrointestinal: No abdominal pain.  No nausea, no vomiting.  No diarrhea.  No constipation. Genitourinary: Negative for dysuria. Musculoskeletal: Negative for back pain. Skin: Negative for rash. Neurological: Negative for headaches, focal weakness or numbness.   ____________________________________________   PHYSICAL EXAM:  VITAL SIGNS: ED Triage Vitals  Enc Vitals Group     BP  04/24/22 2036 136/87     Pulse Rate 04/24/22 2032 77     Resp 04/24/22 2032 17     Temp 04/24/22 2032 98.5 F (36.9 C)     Temp Source 04/24/22 2032 Oral     SpO2 04/24/22 2032 97 %     Weight 04/24/22 2033 270 lb (122.5 kg)     Height 04/24/22 2033 6\' 2"  (1.88 m)   Constitutional: Alert and oriented. Well appearing and in no acute distress. Eyes: Conjunctivae are normal.  Head: Atraumatic. Nose: No congestion/rhinnorhea. Mouth/Throat: Mucous membranes are moist. Neck: No stridor.   Cardiovascular: Normal rate, regular rhythm. Good peripheral circulation. Grossly normal heart sounds.   Respiratory: Normal respiratory effort.  No retractions. Lungs with crackles at the bases. Gastrointestinal: Soft and nontender. No distention.  Musculoskeletal: No lower extremity tenderness with 2+ pitting edema. No gross deformities of extremities. Neurologic:  Normal speech and language. No gross focal neurologic deficits are appreciated.  Skin:  Skin is warm, dry and intact. No rash noted.  ____________________________________________   LABS (all labs ordered are listed, but only abnormal results are displayed)  Labs Reviewed  COMPREHENSIVE METABOLIC PANEL - Abnormal; Notable for the following components:      Result Value   Chloride 113 (*)    CO2 20 (*)    Glucose, Bld 146 (*)    BUN 45 (*)    Creatinine, Ser 2.83 (*)    Albumin 3.3 (*)    GFR, Estimated 23 (*)  All other components within normal limits  BRAIN NATRIURETIC PEPTIDE - Abnormal; Notable for the following components:   B Natriuretic Peptide 257.2 (*)    All other components within normal limits  CBC WITH DIFFERENTIAL/PLATELET - Abnormal; Notable for the following components:   RBC 3.94 (*)    Hemoglobin 11.0 (*)    HCT 34.7 (*)    All other components within normal limits  CBC - Abnormal; Notable for the following components:   RBC 4.07 (*)    Hemoglobin 11.4 (*)    HCT 36.5 (*)    All other components within  normal limits  BASIC METABOLIC PANEL - Abnormal; Notable for the following components:   Glucose, Bld 268 (*)    BUN 46 (*)    Creatinine, Ser 2.94 (*)    GFR, Estimated 22 (*)    All other components within normal limits  URINALYSIS, ROUTINE W REFLEX MICROSCOPIC  TROPONIN I (HIGH SENSITIVITY)  TROPONIN I (HIGH SENSITIVITY)   ____________________________________________  EKG   EKG Interpretation  Date/Time:  Thursday April 24 2022 20:37:00 EDT Ventricular Rate:  77 PR Interval:  160 QRS Duration: 166 QT Interval:  445 QTC Calculation: 504 R Axis:   -59 Text Interpretation: Sinus rhythm Probable left atrial enlargement Left bundle branch block Confirmed by Nanda Quinton [phone]) on 04/24/2022 8:39:45 PM        ____________________________________________  RADIOLOGY  DG Chest Portable 1 View  Result Date: 04/24/2022 CLINICAL DATA:  Shortness of breath, difficulty breathing EXAM: PORTABLE CHEST 1 VIEW COMPARISON:  04/23/2022 FINDINGS: Lungs are clear.  No pleural effusion or pneumothorax. The heart is top-normal in size.  Thoracic aortic atherosclerosis. IMPRESSION: No evidence of acute cardiopulmonary disease. Electronically Signed   By: Julian Hy M.D.   On: 04/24/2022 21:12    ____________________________________________   PROCEDURES  Procedure(s) performed:   Procedures  CRITICAL CARE Performed by: Margette Fast Total critical care time: 35 minutes Critical care time was exclusive of separately billable procedures and treating other patients. Critical care was necessary to treat or prevent imminent or life-threatening deterioration. Critical care was time spent personally by me on the following activities: development of treatment plan with patient and/or surrogate as well as nursing, discussions with consultants, evaluation of patient's response to treatment, examination of patient, obtaining history from patient or surrogate, ordering and performing  treatments and interventions, ordering and review of laboratory studies, ordering and review of radiographic studies, pulse oximetry and re-evaluation of patient's condition.  Nanda Quinton, MD Emergency Medicine  ____________________________________________   INITIAL IMPRESSION / ASSESSMENT AND PLAN / ED COURSE  Pertinent labs & imaging results that were available during my care of the patient were reviewed by me and considered in my medical decision making (see chart for details).   This patient is Presenting for Evaluation of SOB, which does require a range of treatment options, and is a complaint that involves a high risk of morbidity and mortality.  The Differential Diagnoses include CHF exacerbation, PE, ACS, CAP, COVID, Flu.  I decided to review pertinent External Data, and in summary patient seen in the ED yesterday with SOB and given lasix with diuresis planned in the outpatient setting. No hypoxemia at that time. TTS evaluated and cleared for outpatient PTSD mgmt through the [place].   Clinical Laboratory Tests Ordered, included troponin normal.  Creatinine 2.83 close to patient's baseline although slightly up from yesterday.  No acute electrolyte disturbance.  Mild anemia at 11.0.  Radiologic Tests Ordered, included  CXR. I independently interpreted the images and agree with radiology interpretation.   Cardiac Monitor Tracing which shows NSR.   Social Determinants of Health Risk history of cocaine abuse.   Consult complete with Hospitalist, plan for admit.   Medical Decision Making: Summary:  Patient presents emergency department shortness of breath and new oxygen requirement.  Clinically patient appears volume overloaded.  Do not appreciate significant wheezing to strongly suspect COPD/asthma.  He is requiring nasal cannula oxygen here, new from yesterday's presentation.  Low suspicion for PE overall and patient's chronic kidney disease makes CTA undesirable with clinical  presentation less consistent with this diagnosis.   Reevaluation with update and discussion with patient. Plan for admit for diuresis.   Disposition: admit  ____________________________________________  FINAL CLINICAL IMPRESSION(S) / ED DIAGNOSES  Final diagnoses:  Acute respiratory failure with hypoxia ([place])     Note:  This document was prepared using Dragon voice recognition software and may include unintentional dictation errors.  Nanda Quinton, MD, [facility]    Jamileth Putzier, [facility], MD 04/25/22 [phone]

## 2022-04-25 ENCOUNTER — Encounter (HOSPITAL_COMMUNITY): Payer: Self-pay | Admitting: Internal Medicine

## 2022-04-25 ENCOUNTER — Other Ambulatory Visit: Payer: Self-pay

## 2022-04-25 DIAGNOSIS — E669 Obesity, unspecified: Secondary | ICD-10-CM | POA: Diagnosis present

## 2022-04-25 DIAGNOSIS — I10 Essential (primary) hypertension: Secondary | ICD-10-CM

## 2022-04-25 DIAGNOSIS — I130 Hypertensive heart and chronic kidney disease with heart failure and stage 1 through stage 4 chronic kidney disease, or unspecified chronic kidney disease: Secondary | ICD-10-CM | POA: Diagnosis present

## 2022-04-25 DIAGNOSIS — E785 Hyperlipidemia, unspecified: Secondary | ICD-10-CM

## 2022-04-25 DIAGNOSIS — K219 Gastro-esophageal reflux disease without esophagitis: Secondary | ICD-10-CM | POA: Diagnosis present

## 2022-04-25 DIAGNOSIS — Z993 Dependence on wheelchair: Secondary | ICD-10-CM | POA: Diagnosis not present

## 2022-04-25 DIAGNOSIS — E1169 Type 2 diabetes mellitus with other specified complication: Secondary | ICD-10-CM

## 2022-04-25 DIAGNOSIS — Z6835 Body mass index (BMI) 35.0-35.9, adult: Secondary | ICD-10-CM | POA: Diagnosis not present

## 2022-04-25 DIAGNOSIS — I825Y1 Chronic embolism and thrombosis of unspecified deep veins of right proximal lower extremity: Secondary | ICD-10-CM

## 2022-04-25 DIAGNOSIS — N179 Acute kidney failure, unspecified: Secondary | ICD-10-CM

## 2022-04-25 DIAGNOSIS — Z8249 Family history of ischemic heart disease and other diseases of the circulatory system: Secondary | ICD-10-CM | POA: Diagnosis not present

## 2022-04-25 DIAGNOSIS — I447 Left bundle-branch block, unspecified: Secondary | ICD-10-CM | POA: Diagnosis present

## 2022-04-25 DIAGNOSIS — Z7901 Long term (current) use of anticoagulants: Secondary | ICD-10-CM | POA: Diagnosis not present

## 2022-04-25 DIAGNOSIS — Z79899 Other long term (current) drug therapy: Secondary | ICD-10-CM | POA: Diagnosis not present

## 2022-04-25 DIAGNOSIS — J9601 Acute respiratory failure with hypoxia: Secondary | ICD-10-CM | POA: Diagnosis present

## 2022-04-25 DIAGNOSIS — N400 Enlarged prostate without lower urinary tract symptoms: Secondary | ICD-10-CM | POA: Diagnosis present

## 2022-04-25 DIAGNOSIS — E1122 Type 2 diabetes mellitus with diabetic chronic kidney disease: Secondary | ICD-10-CM | POA: Diagnosis present

## 2022-04-25 DIAGNOSIS — I5023 Acute on chronic systolic (congestive) heart failure: Secondary | ICD-10-CM

## 2022-04-25 DIAGNOSIS — N189 Chronic kidney disease, unspecified: Secondary | ICD-10-CM

## 2022-04-25 DIAGNOSIS — G40909 Epilepsy, unspecified, not intractable, without status epilepticus: Secondary | ICD-10-CM | POA: Diagnosis present

## 2022-04-25 DIAGNOSIS — N184 Chronic kidney disease, stage 4 (severe): Secondary | ICD-10-CM | POA: Diagnosis present

## 2022-04-25 DIAGNOSIS — E1165 Type 2 diabetes mellitus with hyperglycemia: Secondary | ICD-10-CM | POA: Diagnosis present

## 2022-04-25 DIAGNOSIS — Z8546 Personal history of malignant neoplasm of prostate: Secondary | ICD-10-CM

## 2022-04-25 DIAGNOSIS — Z87891 Personal history of nicotine dependence: Secondary | ICD-10-CM | POA: Diagnosis not present

## 2022-04-25 DIAGNOSIS — R45851 Suicidal ideations: Secondary | ICD-10-CM | POA: Diagnosis present

## 2022-04-25 DIAGNOSIS — I82511 Chronic embolism and thrombosis of right femoral vein: Secondary | ICD-10-CM | POA: Diagnosis present

## 2022-04-25 DIAGNOSIS — E875 Hyperkalemia: Secondary | ICD-10-CM | POA: Diagnosis present

## 2022-04-25 LAB — CBC
HCT: 36.5 % — ABNORMAL LOW (ref 39.0–52.0)
Hemoglobin: 11.4 g/dL — ABNORMAL LOW (ref 13.0–17.0)
MCH: 28 pg (ref 26.0–34.0)
MCHC: 31.2 g/dL (ref 30.0–36.0)
MCV: 89.7 fL (ref 80.0–100.0)
Platelets: 232 10*3/uL (ref 150–400)
RBC: 4.07 MIL/uL — ABNORMAL LOW (ref 4.22–5.81)
RDW: 14.4 % (ref 11.5–15.5)
WBC: 4.9 10*3/uL (ref 4.0–10.5)
nRBC: 0 % (ref 0.0–0.2)

## 2022-04-25 LAB — BASIC METABOLIC PANEL
Anion gap: 8 (ref 5–15)
BUN: 46 mg/dL — ABNORMAL HIGH (ref 8–23)
CO2: 22 mmol/L (ref 22–32)
Calcium: 9.2 mg/dL (ref 8.9–10.3)
Chloride: 109 mmol/L (ref 98–111)
Creatinine, Ser: 2.94 mg/dL — ABNORMAL HIGH (ref 0.61–1.24)
GFR, Estimated: 22 mL/min — ABNORMAL LOW (ref >60)
Glucose, Bld: 268 mg/dL — ABNORMAL HIGH (ref 70–99)
Potassium: 4.8 mmol/L (ref 3.5–5.1)
Sodium: 139 mmol/L (ref 135–145)

## 2022-04-25 LAB — HEPATITIS B SURFACE ANTIGEN: Hepatitis B Surface Ag: NONREACTIVE

## 2022-04-25 MED ORDER — LEVETIRACETAM 750 MG PO TABS
750.0000 mg | ORAL_TABLET | Freq: Two times a day (BID) | ORAL | Status: DC
  Administered 2022-04-25 – 2022-05-01 (×13): 750 mg via ORAL
  Filled 2022-04-25 (×16): qty 1

## 2022-04-25 MED ORDER — VITAMIN D 25 MCG (1000 UNIT) PO TABS
2000.0000 [IU] | ORAL_TABLET | Freq: Every day | ORAL | Status: DC
Start: 2022-04-25 — End: 2022-05-01
  Administered 2022-04-26 – 2022-05-01 (×6): 2000 [IU] via ORAL
  Filled 2022-04-25 (×6): qty 2

## 2022-04-25 MED ORDER — CARVEDILOL 12.5 MG PO TABS
12.5000 mg | ORAL_TABLET | Freq: Two times a day (BID) | ORAL | Status: DC
  Administered 2022-04-25 – 2022-05-01 (×12): 12.5 mg via ORAL
  Filled 2022-04-25 (×11): qty 1

## 2022-04-25 MED ORDER — LACOSAMIDE 100 MG PO TABS: 100.0000 mg | ORAL_TABLET | Freq: Two times a day (BID) | ORAL | Status: DC

## 2022-04-25 MED ORDER — APIXABAN 5 MG PO TABS
5.0000 mg | ORAL_TABLET | Freq: Two times a day (BID) | ORAL | Status: DC
  Administered 2022-04-25 – 2022-05-01 (×13): 5 mg via ORAL
  Filled 2022-04-25 (×13): qty 1

## 2022-04-25 MED ORDER — HYDROCODONE-ACETAMINOPHEN 5-325 MG PO TABS
1.0000 | ORAL_TABLET | Freq: Two times a day (BID) | ORAL | Status: DC | PRN
  Administered 2022-04-26 – 2022-04-30 (×7): 1 via ORAL
  Filled 2022-04-25 (×7): qty 1

## 2022-04-25 MED ORDER — PNEUMOCOCCAL 20-VAL CONJ VACC 0.5 ML IM SUSY
0.5000 mL | PREFILLED_SYRINGE | INTRAMUSCULAR | Status: DC
  Filled 2022-04-25: qty 0.5

## 2022-04-25 MED ORDER — FUROSEMIDE 10 MG/ML IJ SOLN
60.0000 mg | Freq: Two times a day (BID) | INTRAMUSCULAR | Status: DC
  Administered 2022-04-25 – 2022-04-27 (×4): 60 mg via INTRAVENOUS
  Filled 2022-04-25 (×4): qty 6

## 2022-04-25 MED ORDER — ATORVASTATIN CALCIUM 80 MG PO TABS
80.0000 mg | ORAL_TABLET | Freq: Every day | ORAL | Status: DC
  Administered 2022-04-26 – 2022-05-01 (×6): 80 mg via ORAL
  Filled 2022-04-25 (×6): qty 1

## 2022-04-25 MED ORDER — FINASTERIDE 5 MG PO TABS
5.0000 mg | ORAL_TABLET | Freq: Every day | ORAL | Status: DC
Start: 2022-04-25 — End: 2022-05-01
  Administered 2022-04-26 – 2022-05-01 (×6): 5 mg via ORAL
  Filled 2022-04-25 (×6): qty 1

## 2022-04-25 MED ORDER — FUROSEMIDE 10 MG/ML IJ SOLN
60.0000 mg | Freq: Every day | INTRAMUSCULAR | Status: DC
  Administered 2022-04-25: 60 mg via INTRAVENOUS
  Filled 2022-04-25: qty 6

## 2022-04-25 MED ORDER — MAGNESIUM OXIDE -MG SUPPLEMENT 400 (240 MG) MG PO TABS
400.0000 mg | ORAL_TABLET | Freq: Every day | ORAL | Status: DC
  Administered 2022-04-26 – 2022-05-01 (×6): 400 mg via ORAL
  Filled 2022-04-25 (×7): qty 1

## 2022-04-25 MED ORDER — LEVETIRACETAM 500 MG PO TABS
750.0000 mg | ORAL_TABLET | Freq: Two times a day (BID) | ORAL | Status: DC
  Filled 2022-04-25: qty 1

## 2022-04-25 MED ORDER — ALPRAZOLAM 0.5 MG PO TABS
0.5000 mg | ORAL_TABLET | Freq: Three times a day (TID) | ORAL | Status: DC | PRN
  Administered 2022-04-25 – 2022-05-01 (×8): 0.5 mg via ORAL
  Filled 2022-04-25 (×9): qty 1

## 2022-04-25 NOTE — Assessment & Plan Note (Addendum)
Continue carvedilol, will add isosorbide and hydralazine.

## 2022-04-25 NOTE — Assessment & Plan Note (Signed)
Currently follows with heme-oncology at [facility] and on active surveillance since he was a poor candidate for standard treatment.

## 2022-04-25 NOTE — Assessment & Plan Note (Signed)
On 2L due to tachypnea in the setting of CHF exacerbation. He appears clinically fluid overload and was evaluated the ED yesterday with 1.5 L of output following IV Lasix.  Questionable whether he has been taking his Eliquis for previous DVT but has lower suspicion for PE at this time and his creatinine is not amendable to contrasted study at this time.  Will continue Eliquis and to follow his respiratory status closely.

## 2022-04-25 NOTE — Discharge Instructions (Addendum)

## 2022-04-25 NOTE — H&P (Signed)
History and Physical    Patient: Tim Bradley VOH:[id number] DOB: 01/06/1951 DOA: 04/24/2022 DOS: the patient was seen and examined on 04/25/2022 PCP: [facility]  Patient coming from: Home  Chief Complaint:  Chief Complaint  Patient presents with   Shortness of Breath   HPI: Tim Bradley is a 71 y.o. male with medical history significant of chronic systolic heart failure, NSTEMI, hypertension, DVT on Eliquis, seizure on Keppra, type 2 diabetes, OSA, prostate cancer on active surveillance, wheelchair-bound due to severe spinal stenosis who presents with concerns of increasing shortness of breath.  Patient is a limited historian.  He reports that he woke up today feeling very short of breath.  Has been having lower extremity edema for some time but later says it might have been for 4 weeks.  He is unable to tell me if he takes a diuretic or if he has missed his Eliquis.  States his sister helps him with medication.  He was seen and discharged in ED yesterday. He was noted to be hypoxic down to 88% on room air and was treated with 60 mg of IV Lasix with 1.5 L of urine output with improvement and resolution of his hypoxia and sent home. He also expressed suicidal intentions yesterday but denies SI today.   In the ED, he was afebrile intermittently hypertensive with tachypnea and placed on 2 L via nasal cannula.  He has no leukocytosis, mild anemia with hemoglobin of 11.  Creatinine of 2.8 from prior of 2.3.  Unclear of his baseline.  BNP of 257.  Troponin is flat at 11 and 10. COVID and flu PCR negative. UDS is negative. Chest x-ray is negative for any signs of pulmonary edema. EKG with left bundle branch block. Review of Systems: As mentioned in the history of present illness. All other systems reviewed and are negative. Past Medical History:  Diagnosis Date   Abscess 02/2017   LEFT GLUTEAL    Acid reflux    Alcohol abuse    Anemia    CHF (congestive heart failure) (HCC)     CKD (chronic kidney disease) stage 4, GFR 15-29 ml/min (HCC) 04/11/2020   Cocaine abuse ([place])    DDD (degenerative disc disease), lumbar    Diabetes mellitus without complication ([place])    Dyspnea    Enlarged prostate    Hypertension    Seizures ([place])    Spinal stenosis, lumbar    Past Surgical History:  Procedure Laterality Date   BACK SURGERY     cervical x2   BIOPSY  08/21/2018   Procedure: BIOPSY;  Surgeon: Ronald Lobo, MD;  Location: [place];  Service: Endoscopy;;   ESOPHAGOGASTRODUODENOSCOPY (EGD) WITH PROPOFOL N/A 08/21/2018   Procedure: ESOPHAGOGASTRODUODENOSCOPY (EGD) WITH PROPOFOL;  Surgeon: Ronald Lobo, MD;  Location: [place];  Service: Endoscopy;  Laterality: N/A;   INCISION AND DRAINAGE PERIRECTAL ABSCESS N/A 03/26/2017   Procedure: IRRIGATION AND DEBRIDEMENT PERIRECTAL ABSCESS;  Surgeon: Rolm Bookbinder, MD;  Location: [place];  Service: General;  Laterality: N/A;   Social History:  reports that he has quit smoking. His smoking use included cigarettes. He has a 10.00 pack-year smoking history. He has never used smokeless tobacco. He reports that he does not currently use alcohol after a past usage of about 12.0 standard drinks of alcohol per week. He reports that he does not currently use drugs after having used the following drugs: Cocaine and Marijuana.  Allergies  Allergen Reactions   Trazodone And Nefazodone Other (See  Comments)    Family reports seizures from this medication    Family History  Problem Relation Age of Onset   Hypertension Mother    Cancer - Lung Father     Prior to Admission medications   Medication Sig Start Date End Date Taking? Authorizing Provider  apixaban (ELIQUIS) 5 MG TABS tablet Take 1 tablet (5 mg total) by mouth 2 (two) times daily. Patient not taking: Reported on 01/24/2022 01/10/22   Horton, Alvin Critchley, DO  atorvastatin (LIPITOR) 80 MG tablet Take 1 tablet (80 mg total) by mouth daily. 01/10/22   Horton, Alvin Critchley, DO   carvedilol (COREG) 25 MG tablet Take 12.5 mg by mouth 2 (two) times daily with a meal.    [provider]  carvedilol (COREG) 6.25 MG tablet Take 1 tablet (6.25 mg total) by mouth 2 (two) times daily with a meal. Patient not taking: Reported on 04/23/2022 01/10/22   Horton, Alvin Critchley, DO  Cholecalciferol (QC VITAMIN D3) 50 MCG (2000 UT) TABS Take 2,000 Units by mouth daily.    [provider]  diclofenac Sodium (VOLTAREN) 1 % GEL Apply 2 g topically 3 (three) times daily.    [provider]  finasteride (PROSCAR) 5 MG tablet Take 1 tablet by mouth daily.    [provider]  HYDROcodone-acetaminophen (NORCO/VICODIN) 5-325 MG tablet Take 1 tablet by mouth 2 (two) times daily as needed for pain.    [provider]  Lacosamide 100 MG TABS Take 100 mg by mouth 2 (two) times daily.    [provider]  levETIRAcetam (KEPPRA) 750 MG tablet Take 1 tablet (750 mg total) by mouth 2 (two) times daily. 02/21/22   Ghimire, Henreitta Leber, MD  Magnesium Oxide 420 MG TABS Take 420 mg by mouth daily.    [provider]  mometasone-formoterol (DULERA) 200-5 MCG/ACT AERO Inhale 2 puffs into the lungs 2 (two) times daily. Patient not taking: Reported on 04/23/2022 01/31/22 03/02/22  Darliss Cheney, MD  naloxone [facility]) nasal spray 4 mg/0.1 mL Place 1 spray into the nose as needed (opioid overdose).    [provider]    Physical Exam: Vitals:   04/24/22 2100 04/24/22 2215 04/25/22 0015 04/25/22 0100  BP: (!) 143/82 (!) 178/104 (!) 163/88 (!) 150/85  Pulse: 80 81 81 81  Resp: 19 18 (!) 22 (!) 22  Temp:      TempSrc:      SpO2: 94% 93% 92% 94%  Weight:      Height:       Constitutional: NAD, calm, comfortable, obese male sitting upright in bed with O2 via nasal cannula and tachypnea when speaking Eyes:  lids and conjunctivae normal ENMT: Mucous membranes are moist.  Neck: normal, supple Respiratory: clear to auscultation bilaterally, no wheezing,  no crackles.  On 2 L via nasal cannula.  Has increased labored respiration with speaking. Cardiovascular: Regular rate and rhythm, no murmurs / rubs / gallops.  +4 pitting edema bilaterally up to knee. Abdomen: Soft, nontender nondistended.  Bowel sounds positive.  Musculoskeletal: no clubbing / cyanosis. No joint deformity upper and lower extremities.  Skin: no rashes, lesions, ulcers. Neurologic: CN 2-12 grossly intact. . Strength 5/5 in all 4.  Psychiatric: Normal judgment and insight. Alert and oriented x 3. Normal mood. Data Reviewed:  See HPI  Assessment and Plan: * Acute on chronic systolic heart failure ([place]) - Most recent echocardiogram on 12/2021 with EF of 35 to 40%.  Will not repeat at this  time. - At home has torsemide 20 mg bid although unclear if he has been adherent to this.  Started on IV 60mg  daily. Follow intake and output.  Acute kidney injury superimposed on chronic kidney disease (HCC) Creatinine is elevated to 2.8 from 2.3.  Unclear of his actual baseline.  Continue to follow closely while being diuresed.  Essential hypertension On IV Lasix.  Resume home antihypertensives pending med rec  Type 2 diabetes mellitus with hyperlipidemia ([place]) Controlled.  Last hemoglobin A1c of 7 about 4 months ago.  DVT (deep venous thrombosis) (HCC) Continue Eliquis.  Has seen hematology recently and is advised to stay on this indefinitely due to his sedentary lifestyle/wheelchair-bound status and ongoing prostate cancer  Seizure disorder ([place]) Continue home Keppra and lacosamide  Obesity (BMI 30-39.9) BMI of 35  Acute respiratory failure with hypoxia (HCC) On 2L due to tachypnea in the setting of CHF exacerbation. He appears clinically fluid overload and was evaluated the ED yesterday with 1.5 L of output following IV Lasix.  Questionable whether he has been taking his Eliquis for previous DVT but has lower suspicion for PE at this time and his creatinine is not amendable to  contrasted study at this time.  Will continue Eliquis and to follow his respiratory status closely.  History of prostate cancer Currently follows with heme[facility] and on active surveillance since he was a poor candidate for standard treatment.      Advance Care Planning:   Code Status: Full Code   Consults: none  Family Communication: No family bedside  Severity of Illness: The appropriate patient status for this patient is OBSERVATION. Observation status is judged to be reasonable and necessary in order to provide the required intensity of service to ensure the patient's safety. The patient's presenting symptoms, physical exam findings, and initial radiographic and laboratory data in the context of their medical condition is felt to place them at decreased risk for further clinical deterioration. Furthermore, it is anticipated that the patient will be medically stable for discharge from the hospital within 2 midnights of admission.   Author: Orene Desanctis, DO 04/25/2022 1:46 AM  For on call review [email or web address].

## 2022-04-25 NOTE — Progress Notes (Signed)
Heart Failure Stewardship Pharmacist Progress Note   [facility]: [facility] PCP-Cardiologist: Jenkins Rouge, MD    HPI:  71 yo M with PMH of CHF, NSTEMI, HTN, DVT, seizures, T2DM, OSA, wheelchair bound due to severe spinal stenosis, and prostate cancer.   He was initially diagnosed with HF in 03/2020 when he presented to the hospital complaining of leg edema and weakness. ECHO at that time with EF <20% and RV mildly reduced. He was diuresed and left AMA on 04/13/20. He was discharged with lasix, carvedilol, and BiDil in the setting of AKI.   He presented back to the hospital on 04/21/20 with acute decompensated HF. He stated at this encounter that he had not picked up the medications due to financial reasons. He was diuresed and discharged on the same medications.   He was back at the hospital in 04/2020 with acute CHF and suspected seizure. He underwent stress test at that time. Significant perfusion defect of the entire anterior and inferior wall that is slightly worse at stress. However, does not appear to be severely abnormal, and distribution does not explain global hypokinesis. Likely infarct with small amount of worsening ischemia with stress, but cardiomyopathy out of proportion to ischemia/infarction. Diuresed and discharged with carvedilol, BiDil, and lasix PRN.  Hospitalized in 11/2021 with acute CHF. He ran out of home diuretics. ECHO from OSH in Feb 2023 with EF 40-45%. Diuresed and discharged with torsemide, carvedilol, and restarted BiDil.   Presented to the ED on 7/27 with shortness of breath and LEE. CXR without edema. Last ECHO on 01/26/22 with EF 35-40%, moderate LVH, RV normal.   Current HF Medications: Diuretic: furosemide 60 mg IV daily  Prior to admission HF Medications: Beta blocker: carvedilol 12.5 mg BID *unclear if he has been compliant with torsemide 20 mg BID  Pertinent Lab Values: Serum creatinine 2.94, BUN 46, Potassium 4.8, Sodium 139, BNP 257.2, Magnesium 1.9,  A1c 7.0  Vital Signs: Weight: 268 lbs (admission weight: 270 lbs) Blood pressure: 160/90s  Heart rate: 70s  I/O: -579m  Medication Assistance / Insurance Benefits Check: Does the patient have prescription insurance?  Yes Type of [facility] plan[facility] Yes    Assessment: 1. Acute on chronic systolic CHF (LVEF 300-92%, due to NICM. NYHA class III symptoms. - Continue furosemide 60 mg IV daily. Strict I/O. Daily weights. Keep K>4 and Mag>2. Mag last checked 7/26. Recheck with AM labs tomorrow.  - Consider restarting carvedilol 12.5 mg BID. Doesn't seem to have low output warranting hold on BB. - No ACE/ARB/ARNI, MRA, or SGLT2i with advanced CKD (eGFR <25)   Plan: 1) Medication changes recommended at this time: - Restart carvedilol 12.5 mg BID - Check magnesium tomorrow AM  2) Patient assistance: - None needed - has VA benefits  3)  Education  - To be completed prior to discharge  MKerby Nora PharmD, BCPS Heart Failure SCytogeneticistPhone ([phone]

## 2022-04-25 NOTE — Assessment & Plan Note (Signed)
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 Left lower extremity with no DVT.   Continue Eliquis.  Has seen hematology recently and is advised to stay on this indefinitely due to his sedentary lifestyle/wheelchair-bound status and ongoing prostate cancer

## 2022-04-25 NOTE — Progress Notes (Signed)
Heart Failure Nurse Navigator Progress Note  PCP: [facility] [facility] Diagnosis: Shortness of breath, Chronic congestive heart failure, Suicidal ideation.  Admitted from: Home via EMS  Presentation:   Tim Bradley presented with shortness of breath over the last 2 days even with a increase to his daily torsemide dose. EMS called placed on Oxygen, on initial assessment patient spoke of suicidal ideations and was placed on watch, pysch came to see him and he was cleared on 04/23/22. Patient lives with his sister and recently a caregiver was hired to help, however, they didn't feel this was happening, unsure if he was given all his medications. On Admission BP 167/89, HR 64, Bilateral lower extremity edema, BNP 373, Given 60 mg IV lasix, plan to admit and diuresis.   Patient and sister [name]) were educated on the sign and symptoms of heart failure, patient currently not able to do standing weights at home. Sign and symptoms of when to call his doctor or go to the ER were discussed, Diet/ fluid restrictions ( patient stated he will eat hotdogs and has tried drinking now energy drinks and beer) Discussion on taking all medications as prescribed and attending all medical appointments. Patient sister spoke of patients recent discharge from [facility] on 04/10/22 and his request to go back. ( This was communicated with his doctor, CSW/ CSM) a hospital follow up with the HF TOC was scheduled for 05/12/2022 @ 12 noon. Both the patient and his sister verbalized their understanding.   ECHO/ LVEF: 35-40%  Clinical Course:  Past Medical History:  Diagnosis Date   Abscess 02/2017   LEFT GLUTEAL    Acid reflux    Alcohol abuse    Anemia    CHF (congestive heart failure) (HCC)    CKD (chronic kidney disease) stage 4, GFR 15-29 ml/min (HCC) 04/11/2020   Cocaine abuse (HCC)    DDD (degenerative disc disease), lumbar    Diabetes mellitus without complication (HCC)     Dyspnea    Enlarged prostate    Hypertension    Seizures ([place])    Spinal stenosis, lumbar      Social History   Socioeconomic History   Marital status: Widowed    Spouse name: Not on file   Number of children: 0   Years of education: Not on file   Highest education level: High school graduate  Occupational History   Occupation: retired  Tobacco Use   Smoking status: Former    Packs/day: 0.25    Years: 40.00    Total pack years: 10.00    Types: Cigarettes    Quit date: 2018    Years since quitting: 5.5   Smokeless tobacco: Never  Vaping Use   Vaping Use: Never used  Substance and Sexual Activity   Alcohol use: Not Currently    Alcohol/week: 12.0 standard drinks of alcohol    Types: 12 Cans of beer per week    Comment: states he quit 3 years ago   Drug use: Not Currently    Types: Cocaine, Marijuana    Comment: 03/2020    Sexual activity: Not Currently  Other Topics Concern   Not on file  Social History Narrative   [place] veteran   Social Determinants of Health   Financial Resource Strain: Medium Risk (05/06/2018)   Overall Financial Resource Strain (CARDIA)    Difficulty of Paying Living Expenses: Somewhat hard  Food Insecurity: No Food Insecurity (04/25/2022)   Hunger Vital  Sign    Worried About Charity fundraiser in the Last Year: Never true    [place] in the Last Year: Never true  Transportation Needs: No Transportation Needs (04/25/2022)   PRAPARE - Hydrologist (Medical): No    Lack of Transportation (Non-Medical): No  Physical Activity: Inactive (05/06/2018)   Exercise Vital Sign    Days of Exercise per Week: 0 days    Minutes of Exercise per Session: 0 min  Stress: No Stress Concern Present (05/06/2018)   [place]    Feeling of Stress : Only a little  Social Connections: Moderately Isolated (05/06/2018)   Social Connection and Isolation Panel [NHANES]     Frequency of Communication with Friends and Family: More than three times a week    Frequency of Social Gatherings with Friends and Family: Once a week    Attends Religious Services: Never    Marine scientist or Organizations: No    Attends Archivist Meetings: Never    Marital Status: Widowed   Education Assessment and Provision:  Detailed education and instructions provided on heart failure disease management including the following:  Signs and symptoms of Heart Failure When to call the physician Importance of daily weights Low sodium diet Fluid restriction Medication management Anticipated future follow-up appointments  Patient education given on each of the above topics.  Patient acknowledges understanding via teach back method and acceptance of all instructions.  Education Materials:  "Living Better With Heart Failure" Booklet, HF zone tool, & Daily Weight Tracker Tool.  Patient has scale at home: yes, can't stand long enough to use it.  Patient has pill box at home: yes    High Risk Criteria for Readmission and/or Poor Patient Outcomes: Heart failure hospital admissions (last 6 months): 2  No Show rate: 2%  Difficult social situation: No Demonstrates medication adherence: ? unsure Primary Language: English Literacy level: Reading, writing, and comprehension.   Barriers of Care:   Diet/ fluid restrictions (Hotdogs, energy drinks, beer) Daily weights (unable to stand to weigh himself) ? Medication compliance  Considerations/Referrals:   Referral made to Heart Failure Pharmacist Stewardship: yes Referral made to Heart Failure CSW/NCM TOC: yes Referral made to Heart & Vascular TOC clinic: yes, 05/12/22 @ 12 noon  Items for Follow-up on DC/TOC: Medication compliance Diet/ fluid restrictions ( hotdogs, energy drinks, beer)    [name], BSN, RN Heart Failure Transport planner Only

## 2022-04-25 NOTE — Progress Notes (Signed)
Pt arrived to 3E26 in possession of $4,449.00. Per pt's request the money is in a locked safe with hospital security.

## 2022-04-25 NOTE — Assessment & Plan Note (Addendum)
Stage 4 CKD with baseline serum cr around 3.  Hyperkalemia.  01/2022 renal US with normal echogenicity and no hydronephrosis.  01/2022 urine analysis with 30 protein.   Renal function with serum cr at 2,94 with K at 4,8 and serum bicarbonate at 22. Plan to continue diuresis with furosemide Follow up renal function in am.  Avoid hypotension or nephrotoxic medications.  Plan to recheck [place] on this admission, will check complement and Hepatitis B and C serologies.  His Hgb A1c was 7,0 in 03/23.

## 2022-04-25 NOTE — Assessment & Plan Note (Signed)
BMI of 35 

## 2022-04-25 NOTE — Assessment & Plan Note (Signed)
Continue home Keppra and lacosamide

## 2022-04-25 NOTE — Hospital Course (Signed)
Tim Bradley was admitted to the hospital with the working diagnosis of decompensated heart failure.   71 yo male with the past medical history of heart failure, hypertension, DVT, T2DM, seizures, and prostate cancer, severe spinal stenosis, wheelchair bound who presented with dyspnea. Reported worsening dyspnea along with lower extremity edema, suspected medical non compliance. The day before hospitalization he was treated for volume overload in the ED with IV furosemide with improvement in his oxygenation, he was discharged home. Because recurrent symptoms he returned to the ED. On his physical examination his blood pressure was 143/82, HR 80 RR 19 and 02 saturation 93%, lungs with no wheezing or rales, heart with S1 and S2 present and rhythmic, abdomen soft, positive lower extremity edema 4+ up to the knee.   Na 141, K 4,8 Cl 113 bicarbonate 20 glucose 146, bun 45 cr 2,83  BNP 257  High sensitive troponin 11 and 10  Wbc 5.4 hgb 11.0 plt 241  Toxicology screen negative   Chest radiograph with cardiomegaly with mild hilar vascular congestion. No infiltrates or effusions.  EKG 78 bpm, left axis deviation, left bundle branch block, qtc 515, sinus rhythm with J point elevation V1 to V4 with no significant T wave changes, positive LVH.   Patient was placed on furosemide for diuresis.

## 2022-04-25 NOTE — Progress Notes (Signed)
Progress Note   Patient: Tim Bradley IWP:[id number] DOB: 12-Sep-1951 DOA: 04/24/2022     0 DOS: the patient was seen and examined on 04/25/2022   Brief hospital course: Tim Bradley was admitted to the hospital with the working diagnosis of decompensated heart failure.   71 yo male with the past medical history of heart failure, hypertension, DVT, T2DM, seizures, and prostate cancer, severe spinal stenosis, wheelchair bound who presented with dyspnea. Reported worsening dyspnea along with lower extremity edema, suspected medical non compliance. The day before hospitalization he was treated for volume overload in the ED with IV furosemide with improvement in his oxygenation, he was discharged home. Because recurrent symptoms he returned to the ED. On his physical examination his blood pressure was 143/82, HR 80 RR 19 and 02 saturation 93%, lungs with no wheezing or rales, heart with S1 and S2 present and rhythmic, abdomen soft, positive lower extremity edema 4+ up to the knee.   Na 141, K 4,8 Cl 113 bicarbonate 20 glucose 146, bun 45 cr 2,83  BNP 257  High sensitive troponin 11 and 10  Wbc 5.4 hgb 11.0 plt 241  Toxicology screen negative   Chest radiograph with cardiomegaly with mild hilar vascular congestion. No infiltrates or effusions.  EKG 78 bpm, left axis deviation, left bundle branch block, qtc 515, sinus rhythm with J point elevation V1 to V4 with no significant T wave changes, positive LVH.   Patient was placed on furosemide for diuresis.     Assessment and Plan: * Acute on chronic systolic heart failure (HCC) Echocardiogram 12/2021 with reduced LV systolic function EF 35 to 40% with global hypokinesis. Moderate LVH, RV systolic function is preserved.   Urine output is 5,053 cc Systolic blood pressure 976 to 170 mmHg. Continue to have significant lower extremity edema.   Plan to continue diuresis furosemide 60 mg IV, increase to q12 hrs Continue with carvedilol for B blocker.   Add hydralazine and isosorbide for after load reduction.  Limited medical therapy due to low GFR.    Acute kidney injury superimposed on chronic kidney disease ([place]) Stage 4 CKD with baseline serum cr around 3.  Hyperkalemia.  01/2022 renal US with normal echogenicity and no hydronephrosis.  01/2022 urine analysis with 30 protein.   Renal function with serum cr at 2,94 with K at 4,8 and serum bicarbonate at 22. Plan to continue diuresis with furosemide Follow up renal function in am.  Avoid hypotension or nephrotoxic medications.  Plan to recheck [place] on this admission, will check complement and Hepatitis B and C serologies.   Essential hypertension Continue carvedilol, will add isosorbide and hydralazine.   Type 2 diabetes mellitus with hyperlipidemia (HCC) Uncontrolled hyperglycemia.   Fasting glucose 268 mg/dl Plan to continue insulin sliding scale for glucose cover and monitoring.   DVT (deep venous thrombosis) (HCC) Continue Eliquis.  Has seen hematology recently and is advised to stay on this indefinitely due to his sedentary lifestyle/wheelchair-bound status and ongoing prostate cancer  Seizure disorder ([place]) Continue home Keppra and lacosamide  History of prostate cancer Currently follows with heme-oncology at [facility] and on active surveillance since he was a poor candidate for standard treatment.  Obesity (BMI 30-39.9) BMI of 35        Subjective: Patient with improved dyspnea but not back to baseline, no chest pain   Physical Exam: Vitals:   04/25/22 0325 04/25/22 0756 04/25/22 0845 04/25/22 1133  BP: (!) 157/96 (!) 164/93 (!) 167/87 (!) 156/100  Pulse: 74 79 73 64  Resp: 16 17 18 20   Temp: 98 F (36.7 C) 98.1 F (36.7 C)  98.5 F (36.9 C)  TempSrc: Oral Oral  Oral  SpO2: 97% 98% 98% 100%  Weight: 121.8 kg     Height:       Neurology awake and alert ENT with mild pallor Cardiovascular with S1 and S2 present and rhythmic with no gallops or  murmur Respiratory with no rales or wheezing Abdomen not distended Positive lower extremity edema ++  Data Reviewed:    Family Communication: no family at the bedside   Disposition: Status is: Inpatient Remains inpatient appropriate because: heart failure   Planned Discharge Destination: Home    Author: Tawni Millers, MD 04/25/2022 3:14 PM  For on call review [email or web address].

## 2022-04-25 NOTE — TOC Initial Note (Signed)
Transition of Care [facility]) - Initial/Assessment Note    Patient Details  Name: Tim Bradley MRN: [id number] Date of Birth: 09/02/1951  Transition [facility] CM/SW Contact:    Bethann Berkshire, [place] Phone Number: 04/25/2022, 2:04 PM  Clinical Narrative:                  CSW is notified by HF team that pt's sister is interested pt returning to [place] met with pt who consents to [place] contacting pt sister. Pt states he is interested in going to [place] at [facility]. He states he is on the waiting list for a LTC facility with the [place] [facility] [place] He states he was in [facility] for a bout 1 month and then returned to his apartment in [place] where his sister sometimes helps with ADLs at home. He also has a paid caregiver who stays in apartment with him though he explains things have not been working out with caregiver. He is interested in [place] at [facility] again.   CSW called pt's sister who confirms that pt was at [facility] from 6/6 -- 7/13. She states that pt was there under his medicare benefits though they are interested in pursuing SNF using VA benefit. Sister would also be interested in the [place] at [facility] in [place] which should be [place] contracted. CSW explained that PT consult is pending and that he will wait for PT recommendation prior to sending referrals.   CSW contacted [facility] liaison; [facility] is contracted with the [place] though they are at max capacity for [place] patients and would not be able to accept anymore under [place].  Expected Discharge Plan: Skilled Nursing Facility Barriers to Discharge: Continued Medical Work up   Patient Goals and CMS Choice        Expected Discharge Plan and Services Expected Discharge Plan: [place] arrangements for the past 2 months: Apartment, [place]                                      Prior Living  Arrangements/Services Living arrangements for the past 2 months: Apartment, [place] Lives with:: Other (Comment) (paid care giver) Patient language and need for interpreter reviewed:: Yes        Need for Family Participation in Patient Care: Yes (Comment) Care giver support system in place?: Yes (comment)   Criminal Activity/Legal Involvement Pertinent to Current Situation/Hospitalization: No - Comment as needed  Activities of Daily Living Home Assistive Devices/Equipment: Wheelchair ADL Screening (condition at time of admission) Patient's cognitive ability adequate to safely complete daily activities?: Yes Is the patient deaf or have difficulty hearing?: No Does the patient have difficulty seeing, even when wearing glasses/contacts?: No Does the patient have difficulty concentrating, remembering, or making decisions?: No Patient able to express need for assistance with ADLs?: Yes Does the patient have difficulty dressing or bathing?: No Independently performs ADLs?: No Communication: Independent Dressing (OT): Needs assistance Is this a change from baseline?: Pre-admission baseline Grooming: Needs assistance Is this a change from baseline?: Pre-admission baseline Feeding: Independent Bathing: Independent Toileting: Needs assistance, Dependent Is this a change from baseline?: Pre-admission baseline In/Out Bed: Needs assistance Is this a change from baseline?: Pre-admission baseline Walks in Home: Dependent Is this a change from baseline?: Pre-admission baseline  Does the patient have difficulty walking or climbing stairs?: Yes Weakness of Legs: Both Weakness of Arms/Hands: None  Permission Sought/Granted   Permission granted to share information with : Yes, Verbal Permission Granted  Share Information with NAME: [name] (Sister)   [phone] (Mobile)           Emotional Assessment Appearance:: Appears stated age Attitude/Demeanor/Rapport:  Engaged Affect (typically observed): Accepting Orientation: : Oriented to Self, Oriented to Place, Oriented to  Time, Oriented to Situation Alcohol / Substance Use: Not Applicable Psych Involvement: No (comment)  Admission diagnosis:  Acute on chronic systolic heart failure (HCC) [I50.23] Acute respiratory failure with hypoxia (HCC) [J96.01] Heart failure ([place]) [I50.9] Patient Active Problem List   Diagnosis Date Noted   History of prostate cancer 04/25/2022   Acute respiratory failure with hypoxia ([place]) 04/25/2022   Obesity (BMI 30-39.9) 04/25/2022   Heart failure ([place]) 04/25/2022   Recurrent seizures ([place]) 02/20/2022   E coli bacteremia 01/29/2022   Situational anxiety 01/26/2022   Physical deconditioning 01/26/2022   CKD (chronic kidney disease) stage 4, GFR 15-29 ml/min ([place]) 01/25/2022   Anemia of chronic disease 01/25/2022   Sepsis secondary to UTI ([place]) 01/24/2022   Acute cystitis 01/24/2022   Class 1 obesity 12/22/2021   Acute on chronic systolic heart failure ([place]) 12/21/2021   DNR (do not resuscitate) 12/21/2021   DVT (deep venous thrombosis) ([place]) 12/21/2021   Substance induced mood disorder ([place]) 10/27/2020   Right leg DVT ([place]) 09/30/2020   Seizure disorder ([place]) 09/17/7587   Chronic systolic CHF (congestive heart failure) ([place]) 09/28/2020   AKI (acute kidney injury) ([place]) 09/28/2020   Pressure injury of skin 05/03/2020   Acute encephalopathy 05/02/2020   Diarrhea 05/02/2020   Acute combined systolic and diastolic congestive heart failure ([place]) 04/22/2020   Essential hypertension 04/11/2020   Leg edema 04/11/2020   Dyspnea 04/11/2020   Acute on chronic congestive heart failure ([place]) 04/11/2020   BPH (benign prostatic hyperplasia) 04/11/2020   Acute kidney injury superimposed on chronic kidney disease ([place]) 04/11/2020   Generalized weakness 04/11/2020   Seizure ([place]) 07/21/2019   Cocaine abuse ([place])    Constipation 08/22/2018   Gastritis 08/20/2018   Heme  positive stool    Acute blood loss anemia secondary to massive gastric ulcer    Hyponatremia    Abscess, gluteal, left 03/26/2017   Type 2 diabetes mellitus with hyperlipidemia ([place]) 03/26/2017   Adrenal nodule ([place]) 03/26/2017   Closed fracture of body of thoracic vertebra ([place]) 03/26/2017   Spinal stenosis of lumbar region 03/26/2017   PCP:  [facility]:   [place], [place] - [address] [place] [address] Phone: [phone] Fax: [place], [place]. [place]. [place] [address] Phone: [phone] Fax: [phone]  [id number] [place] [address] [place]: [phone]: [phone] [facility] [address] [place]Phone: [phone]: [phone]    Social Determinants of Health (SDOH) Interventions Food Insecurity Interventions: Intervention Not Indicated Housing Interventions: Intervention Not Indicated Transportation Interventions: Intervention Not Indicated  Readmission Risk Interventions    10/01/2020   12:53 PM  Readmission Risk Prevention Plan  Transportation Screening Complete  Medication Review ([place] Complete  PCP or Specialist appointment within 3-5 days of discharge Complete  HRI or HHytop  Complete  SW Recovery Care/Counseling Consult Complete  Palliative Care Screening Complete  Skilled Nursing Facility Patient Refused

## 2022-04-25 NOTE — Assessment & Plan Note (Addendum)
Echocardiogram 12/2021 with reduced LV systolic function EF 35 to 40% with global hypokinesis. Moderate LVH, RV systolic function is preserved.   Urine output is 3,350 ml over last 24 hrs  Systolic blood pressure 174 to 160 mmHg. Edema lower extremities is improving, more edema right than left.    Continue with furosemide 60 mg IV, increase to q12 hrs Carvedilol for B blocker.  Add hydralazine and isosorbide for after load reduction.  Limited medical therapy due to low GFR.

## 2022-04-25 NOTE — Assessment & Plan Note (Addendum)
Uncontrolled hyperglycemia.   Fasting glucose 268 mg/dl Plan to continue insulin sliding scale for glucose cover and monitoring.

## 2022-04-26 DIAGNOSIS — I5023 Acute on chronic systolic (congestive) heart failure: Secondary | ICD-10-CM | POA: Diagnosis not present

## 2022-04-26 DIAGNOSIS — N179 Acute kidney failure, unspecified: Secondary | ICD-10-CM | POA: Diagnosis not present

## 2022-04-26 DIAGNOSIS — I10 Essential (primary) hypertension: Secondary | ICD-10-CM | POA: Diagnosis not present

## 2022-04-26 DIAGNOSIS — E1169 Type 2 diabetes mellitus with other specified complication: Secondary | ICD-10-CM | POA: Diagnosis not present

## 2022-04-26 LAB — RENAL FUNCTION PANEL
Albumin: 3.3 g/dL — ABNORMAL LOW (ref 3.5–5.0)
Anion gap: 8 (ref 5–15)
BUN: 55 mg/dL — ABNORMAL HIGH (ref 8–23)
CO2: 24 mmol/L (ref 22–32)
Calcium: 8.8 mg/dL — ABNORMAL LOW (ref 8.9–10.3)
Chloride: 106 mmol/L (ref 98–111)
Creatinine, Ser: 2.97 mg/dL — ABNORMAL HIGH (ref 0.61–1.24)
GFR, Estimated: 22 mL/min — ABNORMAL LOW (ref >60)
Glucose, Bld: 216 mg/dL — ABNORMAL HIGH (ref 70–99)
Phosphorus: 4.5 mg/dL (ref 2.5–4.6)
Potassium: 4.6 mmol/L (ref 3.5–5.1)
Sodium: 138 mmol/L (ref 135–145)

## 2022-04-26 LAB — URINALYSIS, ROUTINE W REFLEX MICROSCOPIC
Bilirubin Urine: NEGATIVE
Glucose, UA: NEGATIVE mg/dL
Hgb urine dipstick: NEGATIVE
Ketones, ur: NEGATIVE mg/dL
Leukocytes,Ua: NEGATIVE
Nitrite: NEGATIVE
Protein, ur: 30 mg/dL — AB
Specific Gravity, Urine: 1.01 (ref 1.005–1.030)
pH: 5 (ref 5.0–8.0)

## 2022-04-26 LAB — GLUCOSE, CAPILLARY
Glucose-Capillary: 209 mg/dL — ABNORMAL HIGH (ref 70–99)
Glucose-Capillary: 241 mg/dL — ABNORMAL HIGH (ref 70–99)
Glucose-Capillary: 112 mg/dL — ABNORMAL HIGH (ref 70–99)

## 2022-04-26 LAB — HEPATITIS B SURFACE ANTIBODY,QUALITATIVE: Hep B S Ab: NONREACTIVE

## 2022-04-26 LAB — HEPATITIS C ANTIBODY: HCV Ab: NONREACTIVE

## 2022-04-26 LAB — HEMOGLOBIN A1C
Hgb A1c MFr Bld: 6.9 % — ABNORMAL HIGH (ref 4.8–5.6)
Mean Plasma Glucose: 151.33 mg/dL

## 2022-04-26 LAB — MAGNESIUM: Magnesium: 2 mg/dL (ref 1.7–2.4)

## 2022-04-26 MED ORDER — ISOSORBIDE DINITRATE 10 MG PO TABS
10.0000 mg | ORAL_TABLET | Freq: Three times a day (TID) | ORAL | Status: DC
  Administered 2022-04-26 – 2022-04-28 (×6): 10 mg via ORAL
  Filled 2022-04-26 (×7): qty 1

## 2022-04-26 MED ORDER — HYDRALAZINE HCL 25 MG PO TABS
25.0000 mg | ORAL_TABLET | Freq: Three times a day (TID) | ORAL | Status: DC
  Administered 2022-04-26 – 2022-04-28 (×7): 25 mg via ORAL
  Filled 2022-04-26 (×8): qty 1

## 2022-04-26 MED ORDER — INSULIN ASPART 100 UNIT/ML IJ SOLN
0.0000 [IU] | Freq: Three times a day (TID) | INTRAMUSCULAR | Status: DC
  Administered 2022-04-26: 5 [IU] via SUBCUTANEOUS
  Administered 2022-04-27 (×2): 3 [IU] via SUBCUTANEOUS
  Administered 2022-04-28: 2 [IU] via SUBCUTANEOUS
  Administered 2022-04-28: 3 [IU] via SUBCUTANEOUS
  Administered 2022-04-28: 5 [IU] via SUBCUTANEOUS
  Administered 2022-04-29 – 2022-04-30 (×3): 2 [IU] via SUBCUTANEOUS
  Administered 2022-04-30: 5 [IU] via SUBCUTANEOUS
  Administered 2022-04-30: 2 [IU] via SUBCUTANEOUS
  Administered 2022-05-01 (×2): 3 [IU] via SUBCUTANEOUS

## 2022-04-26 MED ORDER — POLYVINYL ALCOHOL 1.4 % OP SOLN
2.0000 [drp] | OPHTHALMIC | Status: DC | PRN
Start: 2022-04-26 — End: 2022-05-01

## 2022-04-26 MED ORDER — ISOSORBIDE DINITRATE 10 MG PO TABS: 10.0000 mg | ORAL_TABLET | Freq: Two times a day (BID) | ORAL | Status: DC

## 2022-04-26 NOTE — Plan of Care (Signed)
  Problem: Education: Goal: Ability to demonstrate management of disease process will improve Outcome: Progressing Goal: Ability to verbalize understanding of medication therapies will improve Outcome: Progressing Goal: Individualized Educational Video(s) Outcome: Progressing   Problem: Activity: Goal: Capacity to carry out activities will improve Outcome: Progressing   Problem: Cardiac: Goal: Ability to achieve and maintain adequate cardiopulmonary perfusion will improve Outcome: Progressing   Problem: Education: Goal: Knowledge of General Education information will improve Description: Including pain rating scale, medication(s)/side effects and non-pharmacologic comfort measures Outcome: Progressing   Problem: Health Behavior/Discharge Planning: Goal: Ability to manage health-related needs will improve Outcome: Progressing   Problem: Clinical Measurements: Goal: Ability to maintain clinical measurements within normal limits will improve Outcome: Progressing Goal: Will remain free from infection Outcome: Progressing Goal: Diagnostic test results will improve Outcome: Progressing Goal: Respiratory complications will improve Outcome: Progressing Goal: Cardiovascular complication will be avoided Outcome: Progressing   Problem: Activity: Goal: Risk for activity intolerance will decrease Outcome: Progressing   Problem: Nutrition: Goal: Adequate nutrition will be maintained Outcome: Progressing   Problem: Coping: Goal: Level of anxiety will decrease Outcome: Progressing   Problem: Elimination: Goal: Will not experience complications related to bowel motility Outcome: Progressing Goal: Will not experience complications related to urinary retention Outcome: Progressing   Problem: Pain Managment: Goal: General experience of comfort will improve Outcome: Progressing   Problem: Safety: Goal: Ability to remain free from injury will improve Outcome: Progressing    Problem: Skin Integrity: Goal: Risk for impaired skin integrity will decrease Outcome: Progressing   Problem: Education: Goal: Ability to describe self-care measures that may prevent or decrease complications (Diabetes Survival Skills Education) will improve Outcome: Progressing Goal: Individualized Educational Video(s) Outcome: Progressing   Problem: Coping: Goal: Ability to adjust to condition or change in health will improve Outcome: Progressing   Problem: Health Behavior/Discharge Planning: Goal: Ability to identify and utilize available resources and services will improve Outcome: Progressing Goal: Ability to manage health-related needs will improve Outcome: Progressing   Problem: Fluid Volume: Goal: Ability to maintain a balanced intake and output will improve Outcome: Progressing   Problem: Metabolic: Goal: Ability to maintain appropriate glucose levels will improve Outcome: Progressing   Problem: Nutritional: Goal: Maintenance of adequate nutrition will improve Outcome: Progressing Goal: Progress toward achieving an optimal weight will improve Outcome: Progressing   Problem: Skin Integrity: Goal: Risk for impaired skin integrity will decrease Outcome: Progressing   Problem: Tissue Perfusion: Goal: Adequacy of tissue perfusion will improve Outcome: Progressing   Problem: Education: Goal: Ability to describe self-care measures that may prevent or decrease complications (Diabetes Survival Skills Education) will improve Outcome: Progressing Goal: Individualized Educational Video(s) Outcome: Progressing   Problem: Coping: Goal: Ability to adjust to condition or change in health will improve Outcome: Progressing   Problem: Fluid Volume: Goal: Ability to maintain a balanced intake and output will improve Outcome: Progressing   Problem: Health Behavior/Discharge Planning: Goal: Ability to identify and utilize available resources and services will  improve Outcome: Progressing Goal: Ability to manage health-related needs will improve Outcome: Progressing   Problem: Nutritional: Goal: Maintenance of adequate nutrition will improve Outcome: Progressing Goal: Progress toward achieving an optimal weight will improve Outcome: Progressing   Problem: Skin Integrity: Goal: Risk for impaired skin integrity will decrease Outcome: Progressing   Problem: Tissue Perfusion: Goal: Adequacy of tissue perfusion will improve Outcome: Progressing

## 2022-04-26 NOTE — Evaluation (Signed)
Occupational Therapy Evaluation Patient Details Name: Tim Bradley MRN: [id number] DOB: 1951/07/27 Today's Date: 04/26/2022   History of Present Illness 71 y.o. male presents to [facility] hospital on 04/24/2022 with SOB. PMH:  chronic systolic heart failure, NSTEMI, hypertension, DVT , DM2, OSA, prostate ca, WC bound due to severe stenosis   Clinical Impression   Pt reported that they were doing stand pivot transfers with RW with assist, completed bed baths, and used adult briefs or bed pan at prior level. Pt required mod x2 fors supine to sitting and mod-max sit to stand transfer to FW and total assist for hygiene following BMs. Pt currently with functional limitations due to the deficits listed below (see OT Problem List).  Pt will benefit from skilled OT to increase their safety and independence with ADL and functional mobility for ADL to facilitate discharge to venue listed below.        Recommendations for follow up therapy are one component of a multi-disciplinary discharge planning process, led by the attending physician.  Recommendations may be updated based on patient status, additional functional criteria and insurance authorization.   Follow Up Recommendations  Skilled nursing-short term rehab (<3 hours/day)    Assistance Recommended at Discharge Frequent or constant Supervision/Assistance  Patient can return home with the following Two people to help with walking and/or transfers;Two people to help with bathing/dressing/bathroom;Assistance with cooking/housework;Assistance with feeding;Direct supervision/assist for medications management;Direct supervision/assist for financial management;Assist for transportation    Functional Status Assessment  Patient has had a recent decline in their functional status and demonstrates the ability to make significant improvements in function in a reasonable and predictable amount of time.  Equipment Recommendations  None recommended by OT (TBD)     Recommendations for Other Services       Precautions / Restrictions Precautions Precautions: Fall Precaution Comments: watch o2 Restrictions Weight Bearing Restrictions: No      Mobility Bed Mobility Overal bed mobility: Needs Assistance Bed Mobility: Supine to Sit     Supine to sit: Mod assist, +2 for physical assistance, HOB elevated          Transfers Overall transfer level: Needs assistance Equipment used: Rolling walker (2 wheels) Transfers: Sit to/from Stand Sit to Stand: Max assist, Mod assist, +2 physical assistance, From elevated surface           General transfer comment: PT gives verbal cues for hand and foot placement for transfers - pt needs physical assist for foot placement; posterior lean while standing      Balance Overall balance assessment: Needs assistance Sitting-balance support: No upper extremity supported, Feet supported Sitting balance-Leahy Scale: Fair   Postural control: Posterior lean Standing balance support: Bilateral upper extremity supported, Reliant on assistive device for balance, During functional activity Standing balance-Leahy Scale: Zero                             ADL either performed or assessed with clinical judgement   ADL Overall ADL's : Needs assistance/impaired Eating/Feeding: Set up;Sitting;Cueing for safety;Cueing for sequencing   Grooming: Wash/dry hands;Wash/dry face;Set up;Cueing for safety;Cueing for sequencing;Sitting   Upper Body Bathing: Minimal assistance;Cueing for safety;Cueing for sequencing;Moderate assistance;Bed level   Lower Body Bathing: Maximal assistance;+2 for physical assistance;+2 for safety/equipment;Cueing for safety;Cueing for sequencing;Bed level   Upper Body Dressing : Minimal assistance;Cueing for safety;Cueing for sequencing   Lower Body Dressing: Maximal assistance;+2 for physical assistance;+2 for safety/equipment;Cueing for safety;Cueing for sequencing;Bed level  Toilet Transfer: Maximal assistance;Total assistance;+2 for physical assistance;+2 for safety/equipment;Cueing for safety;Cueing for sequencing;Rolling walker (2 wheels)   Toileting- Clothing Manipulation and Hygiene: Total assistance;+2 for physical assistance;+2 for safety/equipment;Cueing for safety;Cueing for sequencing;Sit to/from stand       Functional mobility during ADLs: Maximal assistance;+2 for physical assistance;+2 for safety/equipment;Rolling walker (2 wheels) (sit to stand transfer)       Vision         Perception     Praxis      Pertinent Vitals/Pain Pain Assessment Pain Assessment: No/denies pain     Hand Dominance Left   Extremity/Trunk Assessment Upper Extremity Assessment Upper Extremity Assessment: RUE deficits/detail;LUE deficits/detail RUE Deficits / Details: Pt reports the RUE is sore but WLF for AROM RUE Sensation: WNL RUE Coordination: WNL LUE Deficits / Details: Pt has had chronic issues with digits 3-5 AROM as has had limited extension LUE Sensation: decreased light touch;decreased proprioception LUE Coordination: decreased fine motor   Lower Extremity Assessment Lower Extremity Assessment: Defer to PT evaluation   Cervical / Trunk Assessment Cervical / Trunk Assessment: Normal   Communication Communication Communication: No difficulties   Cognition Arousal/Alertness: Awake/alert Behavior During Therapy: WFL for tasks assessed/performed Overall Cognitive Status: Within Functional Limits for tasks assessed                                       General Comments  O2 sats difficult to assess - pt with very cold hands; SOB and fatigued throughout session; placed on supplemental O2 during session, received on RA    Exercises     Shoulder Instructions      Home Living Family/patient expects to be discharged to:: Private residence Living Arrangements: Other relatives Available Help at Discharge: Family;Available 24  hours/day Type of Home: Apartment Home Access: Level entry     Home Layout: One level     Bathroom Shower/Tub: Occupational psychologist: Standard Bathroom Accessibility: Yes   Home Equipment: Conservation officer, nature (2 wheels);BSC/3in1;Wheelchair - Education officer, community - power;Hospital bed;Other (comment)   Additional Comments: Pt is interested in seeing if he can get a consistent personal care attentdant through insurance      Prior Functioning/Environment Prior Level of Function : Needs assist             Mobility Comments: pt is able to stand and transfer with assistance utilizing RW ADLs Comments: pt bathes in the bed and requires assistance for ADLs        OT Problem List: Decreased strength;Decreased range of motion;Decreased activity tolerance;Impaired balance (sitting and/or standing);Decreased cognition;Decreased safety awareness;Decreased knowledge of use of DME or AE;Cardiopulmonary status limiting activity      OT Treatment/Interventions: Self-care/ADL training;Therapeutic exercise;DME and/or AE instruction;Therapeutic activities;Cognitive remediation/compensation;Patient/family education;Balance training    OT Goals(Current goals can be found in the care plan section) Acute Rehab OT Goals Patient Stated Goal: to be able to stand up OT Goal Formulation: With patient Time For Goal Achievement: 05/10/22 Potential to Achieve Goals: Good ADL Goals Pt Will Perform Grooming: with set-up;sitting Pt Will Perform Upper Body Bathing: with set-up;sitting Pt Will Perform Lower Body Bathing: with max assist;sitting/lateral leans;sit to/from stand;bed level  OT Frequency: Min 2X/week    Co-evaluation PT/OT/SLP Co-Evaluation/Treatment: Yes Reason for Co-Treatment: Complexity of the patient's impairments (multi-system involvement) PT goals addressed during session: Mobility/safety with mobility;Balance;Strengthening/ROM;Proper use of DME OT goals addressed during session:  ADL's and self-care;Proper use  of Adaptive equipment and DME      AM-PAC OT "6 Clicks" Daily Activity     Outcome Measure Help from another person eating meals?: A Little Help from another person taking care of personal grooming?: A Little Help from another person toileting, which includes using toliet, bedpan, or urinal?: Total Help from another person bathing (including washing, rinsing, drying)?: A Lot Help from another person to put on and taking off regular upper body clothing?: A Little Help from another person to put on and taking off regular lower body clothing?: A Lot 6 Click Score: 14   End of Session Equipment Utilized During Treatment: Gait belt;Rolling walker (2 wheels) Nurse Communication: Mobility status  Activity Tolerance: Patient tolerated treatment well Patient left: in bed;with call bell/phone within reach;with bed alarm set;with family/visitor present  OT Visit Diagnosis: Unsteadiness on feet (R26.81);Other abnormalities of gait and mobility (R26.89);Muscle weakness (generalized) (M62.81)                Time: 2671-2458 OT Time Calculation (min): 57 min Charges:  OT General Charges $OT Visit: 1 Visit OT Evaluation $OT Eval Moderate Complexity: 1 Mod OT Treatments $Self Care/Home Management : 8-22 mins  Joeseph Amor OTR/L  Acute Rehab Services  [phone] office number [phone] pager number   Joeseph Amor 04/26/2022, 12:55 PM

## 2022-04-26 NOTE — Plan of Care (Signed)

## 2022-04-26 NOTE — Evaluation (Signed)
Physical Therapy Evaluation Patient Details Name: Tim Bradley MRN: [id number] DOB: Jun 03, 1951 Today's Date: 04/26/2022  History of Present Illness  71 y.o. male presents to [facility] hospital on 04/24/2022 with SOB. PMH:  chronic systolic heart failure, NSTEMI, hypertension, DVT , DM2, OSA, prostate ca, WC bound due to severe stenosis  Clinical Impression  Pt presents with impairments in strength, balance, activity tolerance, ROM, and coordination. Pt tolerates therapy well today, participating in transfer training. Weakness and posterior lean limit pt's ability to stand, and he requires significant physical assist. O2 sats difficult to assess during session due to pt's cold hands; PT monitors symptoms and places O2 [place] on pt (received on RA). Continued therapy will assist the pt in building strength, balance, and activity tolerance for safe progression of mobility and independence. Assess lateral slide board transfers next session to determine potential for reduced caregiver burden and increased safety. PT recommends SNF due to pt requiring significant physical assist for mobility and his increased risk of falls.     Recommendations for follow up therapy are one component of a multi-disciplinary discharge planning process, led by the attending physician.  Recommendations may be updated based on patient status, additional functional criteria and insurance authorization.  Follow Up Recommendations Skilled nursing-short term rehab (<3 hours/day) Can patient physically be transported by private vehicle: No    Assistance Recommended at Discharge Frequent or constant Supervision/Assistance  Patient can return home with the following  A lot of help with walking and/or transfers;A lot of help with bathing/dressing/bathroom;Assistance with cooking/housework;Assist for transportation;Help with stairs or ramp for entrance    Equipment Recommendations None recommended by PT  Recommendations for Other Services        Functional Status Assessment Patient has had a recent decline in their functional status and/or demonstrates limited ability to make significant improvements in function in a reasonable and predictable amount of time     Precautions / Restrictions Precautions Precautions: Fall Precaution Comments: watch o2 Restrictions Weight Bearing Restrictions: No      Mobility  Bed Mobility                    Transfers   Equipment used: Rolling walker (2 wheels) Transfers: Sit to/from Stand Sit to Stand: Max assist, Mod assist, +2 physical assistance, From elevated surface (Max +1; Mod +2)           General transfer comment: PT gives verbal cues for hand and foot placement for transfers - pt needs physical assist for foot placement; posterior lean while standing    Ambulation/Gait                  Stairs            Wheelchair Mobility    Modified Rankin (Stroke Patients Only)       Balance Overall balance assessment: Needs assistance Sitting-balance support: No upper extremity supported, Feet supported Sitting balance-Leahy Scale: Fair   Postural control: Posterior lean Standing balance support: Bilateral upper extremity supported, Reliant on assistive device for balance, During functional activity Standing balance-Leahy Scale: Zero                               Pertinent Vitals/Pain      Home Living Family/patient expects to be discharged to:: Private residence Living Arrangements: Other relatives Available Help at Discharge: Family;Available 24 hours/day Type of Home: Apartment Home Access: Level entry  Home Layout: One level Home Equipment: Conservation officer, nature (2 wheels);BSC/3in1;Wheelchair - manual;Wheelchair - power;Hospital bed;Other (comment) Additional Comments: Pt is interested in seeing if he can get a consistent personal care attentdant through insurance    Prior Function Prior Level of Function : Needs assist              Mobility Comments: pt is able to stand and transfer with assistance utilizing RW ADLs Comments: pt bathes in the bed and requires assistance for ADLs     Hand Dominance   Dominant Hand: Left    Extremity/Trunk Assessment   Upper Extremity Assessment Upper Extremity Assessment: RUE deficits/detail;LUE deficits/detail RUE Deficits / Details: Pt reports the RUE is sore but WLF for AROM RUE Sensation: WNL RUE Coordination: WNL LUE Deficits / Details: Pt has had chronic issues with digits 3-5 AROM as has had limited extension LUE Sensation: decreased light touch;decreased proprioception LUE Coordination: decreased fine motor    Lower Extremity Assessment Lower Extremity Assessment: Defer to PT evaluation    Cervical / Trunk Assessment Cervical / Trunk Assessment: Normal  Communication   Communication: No difficulties  Cognition     Overall Cognitive Status: Within Functional Limits for tasks assessed                                          General Comments General comments (skin integrity, edema, etc.): O2 sats difficult to assess - pt with very cold hands; SOB and fatigued throughout session; placed on supplemental O2 during session, received on RA    Exercises     Assessment/Plan    PT Assessment Patient needs continued PT services  PT Problem List Decreased strength;Decreased range of motion;Decreased activity tolerance;Decreased balance;Decreased mobility;Decreased coordination;Cardiopulmonary status limiting activity       PT Treatment Interventions DME instruction;Gait training;Stair training;Functional mobility training;Therapeutic activities;Therapeutic exercise;Balance training;Patient/family education    PT Goals (Current goals can be found in the Care Plan section)  Acute Rehab PT Goals PT Goal Formulation: With patient Time For Goal Achievement: 05/10/22 Potential to Achieve Goals: Fair    Frequency Min 3X/week      Co-evaluation PT/OT/SLP Co-Evaluation/Treatment: Yes Reason for Co-Treatment: Complexity of the patient's impairments (multi-system involvement) PT goals addressed during session: Mobility/safety with mobility;Balance;Strengthening/ROM;Proper use of DME OT goals addressed during session: ADL's and self-care;Proper use of Adaptive equipment and DME       AM-PAC PT "6 Clicks" Mobility  Outcome Measure Help needed turning from your back to your side while in a flat bed without using bedrails?: A Lot Help needed moving from lying on your back to sitting on the side of a flat bed without using bedrails?: A Lot Help needed moving to and from a bed to a chair (including a wheelchair)?: A Lot Help needed standing up from a chair using your arms (e.g., wheelchair or bedside chair)?: A Lot Help needed to walk in hospital room?: Total Help needed climbing 3-5 steps with a railing? : Total 6 Click Score: 10    End of Session Equipment Utilized During Treatment: Gait belt;Oxygen Activity Tolerance: Patient tolerated treatment well Patient left: in bed;with call bell/phone within reach;with bed alarm set Nurse Communication: Mobility status PT Visit Diagnosis: Unsteadiness on feet (R26.81);Muscle weakness (generalized) (M62.81);Difficulty in walking, not elsewhere classified (R26.2)    Time: 4782-9562 PT Time Calculation (min) (ACUTE ONLY): 57 min   Charges:   PT Evaluation $  PT Eval Low Complexity: 1 Low PT Treatments $Therapeutic Activity: 8-22 mins        Hall Busing, SPT Acute Rehabilitation Office #: [phone]   Hall Busing 04/26/2022, 2:19 PM

## 2022-04-26 NOTE — Progress Notes (Signed)
Progress Note   Patient: Tim Bradley JKD:[id number] DOB: [birth year] DOA: 04/24/2022     1 DOS: the patient was seen and examined on 04/26/2022   Brief hospital course: Mr. Bottomley was admitted to the hospital with the working diagnosis of decompensated heart failure.   71 yo male with the past medical history of heart failure, hypertension, DVT, T2DM, seizures, and prostate cancer, severe spinal stenosis, wheelchair bound who presented with dyspnea. Reported worsening dyspnea along with lower extremity edema, suspected medical non compliance. The day before hospitalization he was treated for volume overload in the ED with IV furosemide with improvement in his oxygenation, he was discharged home. Because recurrent symptoms he returned to the ED. On his physical examination his blood pressure was 143/82, HR 80 RR 19 and 02 saturation 93%, lungs with no wheezing or rales, heart with S1 and S2 present and rhythmic, abdomen soft, positive lower extremity edema 4+ up to the knee.   Na 141, K 4,8 Cl 113 bicarbonate 20 glucose 146, bun 45 cr 2,83  BNP 257  High sensitive troponin 11 and 10  Wbc 5.4 hgb 11.0 plt 241  Toxicology screen negative   Chest radiograph with cardiomegaly with mild hilar vascular congestion. No infiltrates or effusions.  EKG 78 bpm, left axis deviation, left bundle branch block, qtc 515, sinus rhythm with J point elevation V1 to V4 with no significant T wave changes, positive LVH.   Patient was placed on furosemide for diuresis.     Assessment and Plan: * Acute on chronic systolic heart failure (HCC) Echocardiogram 12/2021 with reduced LV systolic function EF 35 to 40% with global hypokinesis. Moderate LVH, RV systolic function is preserved.   Urine output is 3,350 ml over last 24 hrs  Systolic blood pressure 099 to 160 mmHg. Edema lower extremities is improving, more edema right than left.    Continue with furosemide 60 mg IV, increase to q12 hrs Carvedilol for B  blocker.  Add hydralazine and isosorbide for after load reduction.  Limited medical therapy due to low GFR.    Acute kidney injury superimposed on chronic kidney disease ([place]) Stage 4 CKD with baseline serum cr around 3.  Hyperkalemia.  01/2022 renal US with normal echogenicity and no hydronephrosis.  01/2022 urine analysis with 30 protein.   Follow up urine analysis with 30 protein, with no leukocytes or rbc.   Negative for hepatitis B or C Pending complement. A1c 6.9.   Renal function today with serum cr at 2,97 with K at 4,6 and serum bicarbonate at 24.   Possible hypertensive nephropathy, follow up on serum complement. Can not rule out primary nephropathy.  Continue diuresis with IV furosemide.    Essential hypertension Continue carvedilol, will add isosorbide and hydralazine (check past reaction to hydralazine).   Type 2 diabetes mellitus with hyperlipidemia (HCC) Uncontrolled hyperglycemia.   Fasting glucose 216 mg/dl Plan to continue insulin sliding scale for glucose cover and monitoring.   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 Left lower extremity with no DVT.   Continue Eliquis.  Has seen hematology recently and is advised to stay on this indefinitely due to his sedentary lifestyle/wheelchair-bound status and ongoing prostate cancer  Seizure disorder ([place]) Continue home Keppra and lacosamide  History of prostate cancer Currently follows with heme-oncology at [facility] and  on active surveillance since he was a poor candidate for standard treatment.  Obesity (BMI 30-39.9) BMI of 35        Subjective: Patient is feeling better, dyspnea and edema are improving, edema not back to baseline   Physical Exam: Vitals:   04/26/22 0400 04/26/22 0404  04/26/22 0827 04/26/22 1051  BP:  (!) 174/82 140/70   Pulse:  70 68   Resp:  20 16   Temp:  98.1 F (36.7 C) 98.8 F (37.1 C)   TempSrc:  Oral Oral   SpO2:  99% 96% 90%  Weight: 122.3 kg     Height:        Neurology awake and alert ENT with no pallor Cardiovascular with S1 and S2 present and rhythmic with no gallops, rubs or murmurs No JVD Lower extremity edema ++ on the right and + on the left Respiratory with no rales or wheezing Abdomen not distended  Data Reviewed:    Family Communication: I spoke with patient's sister at the bedside, we talked in detail about patient's condition, plan of care and prognosis and all questions were addressed.   Disposition: Status is: Inpatient Remains inpatient appropriate because: heart failure   Planned Discharge Destination: Skilled nursing facility     Author: Tawni Millers, MD 04/26/2022 1:01 PM  For on call review [email or web address].

## 2022-04-27 DIAGNOSIS — N179 Acute kidney failure, unspecified: Secondary | ICD-10-CM | POA: Diagnosis not present

## 2022-04-27 DIAGNOSIS — I5023 Acute on chronic systolic (congestive) heart failure: Secondary | ICD-10-CM | POA: Diagnosis not present

## 2022-04-27 DIAGNOSIS — E1169 Type 2 diabetes mellitus with other specified complication: Secondary | ICD-10-CM | POA: Diagnosis not present

## 2022-04-27 DIAGNOSIS — I10 Essential (primary) hypertension: Secondary | ICD-10-CM | POA: Diagnosis not present

## 2022-04-27 LAB — GLUCOSE, CAPILLARY
Glucose-Capillary: 160 mg/dL — ABNORMAL HIGH (ref 70–99)
Glucose-Capillary: 97 mg/dL (ref 70–99)
Glucose-Capillary: 157 mg/dL — ABNORMAL HIGH (ref 70–99)

## 2022-04-27 LAB — BASIC METABOLIC PANEL
Anion gap: 9 (ref 5–15)
BUN: 59 mg/dL — ABNORMAL HIGH (ref 8–23)
CO2: 19 mmol/L — ABNORMAL LOW (ref 22–32)
Calcium: 8.6 mg/dL — ABNORMAL LOW (ref 8.9–10.3)
Chloride: 109 mmol/L (ref 98–111)
Creatinine, Ser: 3.17 mg/dL — ABNORMAL HIGH (ref 0.61–1.24)
GFR, Estimated: 20 mL/min — ABNORMAL LOW (ref >60)
Glucose, Bld: 171 mg/dL — ABNORMAL HIGH (ref 70–99)
Potassium: 5 mmol/L (ref 3.5–5.1)
Sodium: 137 mmol/L (ref 135–145)

## 2022-04-27 LAB — C4 COMPLEMENT: Complement C4, Body Fluid: 27 mg/dL (ref 12–38)

## 2022-04-27 LAB — C3 COMPLEMENT: C3 Complement: 118 mg/dL (ref 82–167)

## 2022-04-27 NOTE — Progress Notes (Signed)
Progress Note   Patient: Tim Bradley TML:[id number] DOB: 05/09/1951 DOA: 04/24/2022     2 DOS: the patient was seen and examined on 04/27/2022   Brief hospital course: Mr. Cubero was admitted to the hospital with the working diagnosis of decompensated heart failure.   71 yo male with the past medical history of heart failure, hypertension, DVT, T2DM, seizures, and prostate cancer, severe spinal stenosis, wheelchair bound who presented with dyspnea. Reported worsening dyspnea along with lower extremity edema, suspected medical non compliance. The day before hospitalization he was treated for volume overload in the ED with IV furosemide with improvement in his oxygenation, he was discharged home. Because recurrent symptoms he returned to the ED. On his physical examination his blood pressure was 143/82, HR 80 RR 19 and 02 saturation 93%, lungs with no wheezing or rales, heart with S1 and S2 present and rhythmic, abdomen soft, positive lower extremity edema 4+ up to the knee.   Na 141, K 4,8 Cl 113 bicarbonate 20 glucose 146, bun 45 cr 2,83  BNP 257  High sensitive troponin 11 and 10  Wbc 5.4 hgb 11.0 plt 241  Toxicology screen negative   Chest radiograph with cardiomegaly with mild hilar vascular congestion. No infiltrates or effusions.  EKG 78 bpm, left axis deviation, left bundle branch block, qtc 515, sinus rhythm with J point elevation V1 to V4 with no significant T wave changes, positive LVH.   Patient was placed on furosemide for diuresis with slowly improving edema.     Assessment and Plan: * Acute on chronic systolic heart failure (HCC) Echocardiogram 12/2021 with reduced LV systolic function EF 35 to 40% with global hypokinesis. Moderate LVH, RV systolic function is preserved.   Urine output is 1,950 ml over last 24 hrs  Systolic blood pressure 681 to 140 mmHg.   Carvedilol for B blocker.  After load reduction with hydralazine and isosorbide possible transition to Bidil at  the time of discharge. Hold on diuresis today due to improvement in volume status and worsening renal function.  Limited medical therapy due to low GFR.    Acute kidney injury superimposed on chronic kidney disease ([place]) Stage 4 CKD with baseline serum cr around 3.  Hyperkalemia.  01/2022 renal US with normal echogenicity and no hydronephrosis.  01/2022 urine analysis with 30 protein.   Follow up urine analysis with 30 protein, with no leukocytes or rbc.   Negative for hepatitis B or C Pending complement. A1c 6.9.   Improved volume status, patient has right lower extremity DVT with chronic edema.  Follow up renal function today with serum cr at 3,17 with K at 5,0 and serum bicarbonate at 19.   Plan to hold on furosemide and follow up on renal function in am.   Possible hypertensive nephropathy, follow up on serum complement. Can not rule out primary nephropathy, pending complement.      Essential hypertension Continue carvedilol, isosorbide and hydralazine.   Type 2 diabetes mellitus with hyperlipidemia (HCC) Uncontrolled hyperglycemia.   Fasting glucose 171 mg/dl Plan to continue insulin sliding scale for glucose cover and monitoring.   Continue with statin therapy.   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 Left lower extremity with no DVT.   Continue Eliquis.  Has seen hematology recently and is advised to stay on this indefinitely  due to his sedentary lifestyle/wheelchair-bound status and ongoing prostate cancer  Seizure disorder ([place]) Continue home Keppra and lacosamide  History of prostate cancer Currently follows with heme-oncology at [facility] and on active surveillance since he was a poor candidate for standard treatment.  Obesity (BMI 30-39.9) BMI  of 35        Subjective: Patient with no chest pain, edema and dyspnea continue to improve.   Physical Exam: Vitals:   04/27/22 0517 04/27/22 0727 04/27/22 0959 04/27/22 1043  BP: 132/78 118/70 134/79 (!) 146/80  Pulse: 65 64 65 64  Resp: 15 13  13   Temp: 97.9 F (36.6 C) 97.6 F (36.4 C)    TempSrc: Oral Oral Oral   SpO2: 95% 93% 98% 96%  Weight:      Height:       Neurology awake and alert ENT with no pallor Cardiovascular with S1 and S2 present and rhythmic with no gallpos Respiratory with no rales or wheezing Abdomen not distended Positive lower extremity edema ++ on the right and + on the left.  Data Reviewed:    Family Communication: no family at the bedside   Disposition: Status is: Inpatient Remains inpatient appropriate because: pending renal function to get stable.   Planned Discharge Destination: Skilled nursing facility    Author: Tawni Millers, MD 04/27/2022 1:09 PM  For on call review [email or web address].

## 2022-04-27 NOTE — TOC Progression Note (Signed)
Transition of Care [facility]) - Progression Note    Patient Details  Name: Tim Bradley MRN: [id number] Date of Birth: May 16, 1951  Transition of Care [facility]) CM/SW [place], LCSW Phone Number:[phone] 04/27/2022, 10:40 AM  Clinical Narrative:     CSW acknowledges PT recs for a SNF. CSW notes in pt's chart that he has a legal guardian. [place], [place] [phone] which we will need to follow up with DC planning. [facility] TOC will follow up.  TOC team will continue to assist with discharge planning needs.   Expected Discharge Plan: Skilled Nursing Facility Barriers to Discharge: Continued Medical Work up  Expected Discharge Plan and Services Expected Discharge Plan: [place] arrangements for the past 2 months: Apartment, [place]                                       Social Determinants of Health (SDOH) Interventions Food Insecurity Interventions: Intervention Not Indicated Housing Interventions: Intervention Not Indicated Transportation Interventions: Intervention Not Indicated  Readmission Risk Interventions    10/01/2020   12:53 PM  Readmission Risk Prevention Plan  Transportation Screening Complete  Medication Review ([place]) Complete  PCP or Specialist appointment within 3-5 days of discharge Complete  HRI or [place] Complete  SW Recovery Care/Counseling Consult Complete  Palliative Care Screening Complete  [place] Patient Refused

## 2022-04-28 ENCOUNTER — Other Ambulatory Visit (HOSPITAL_COMMUNITY): Payer: Self-pay

## 2022-04-28 DIAGNOSIS — N179 Acute kidney failure, unspecified: Secondary | ICD-10-CM | POA: Diagnosis not present

## 2022-04-28 DIAGNOSIS — I10 Essential (primary) hypertension: Secondary | ICD-10-CM | POA: Diagnosis not present

## 2022-04-28 DIAGNOSIS — E1169 Type 2 diabetes mellitus with other specified complication: Secondary | ICD-10-CM | POA: Diagnosis not present

## 2022-04-28 DIAGNOSIS — I5023 Acute on chronic systolic (congestive) heart failure: Secondary | ICD-10-CM | POA: Diagnosis not present

## 2022-04-28 LAB — GLUCOSE, CAPILLARY
Glucose-Capillary: 228 mg/dL — ABNORMAL HIGH (ref 70–99)
Glucose-Capillary: 126 mg/dL — ABNORMAL HIGH (ref 70–99)
Glucose-Capillary: 137 mg/dL — ABNORMAL HIGH (ref 70–99)

## 2022-04-28 LAB — BASIC METABOLIC PANEL
Anion gap: 6 (ref 5–15)
BUN: 64 mg/dL — ABNORMAL HIGH (ref 8–23)
CO2: 27 mmol/L (ref 22–32)
Calcium: 8.8 mg/dL — ABNORMAL LOW (ref 8.9–10.3)
Chloride: 107 mmol/L (ref 98–111)
Creatinine, Ser: 3.38 mg/dL — ABNORMAL HIGH (ref 0.61–1.24)
GFR, Estimated: 18 mL/min — ABNORMAL LOW (ref >60)
Glucose, Bld: 201 mg/dL — ABNORMAL HIGH (ref 70–99)
Potassium: 4.3 mmol/L (ref 3.5–5.1)
Sodium: 140 mmol/L (ref 135–145)

## 2022-04-28 LAB — MAGNESIUM: Magnesium: 2.1 mg/dL (ref 1.7–2.4)

## 2022-04-28 MED ORDER — ISOSORB DINITRATE-HYDRALAZINE 20-37.5 MG PO TABS
1.0000 | ORAL_TABLET | Freq: Three times a day (TID) | ORAL | Status: DC
  Administered 2022-04-28 – 2022-05-01 (×9): 1 via ORAL
  Filled 2022-04-28 (×9): qty 1

## 2022-04-28 NOTE — Progress Notes (Signed)
Progress Note   Patient: Tim Bradley MOQ:[id number] DOB: 11-May-1951 DOA: 04/24/2022     3 DOS: the patient was seen and examined on 04/28/2022   Brief hospital course: Mr. Limas was admitted to the hospital with the working diagnosis of decompensated heart failure.   71 yo male with the past medical history of heart failure, hypertension, DVT, T2DM, seizures, and prostate cancer, severe spinal stenosis, wheelchair bound who presented with dyspnea. Reported worsening dyspnea along with lower extremity edema, suspected medical non compliance. The day before hospitalization he was treated for volume overload in the ED with IV furosemide with improvement in his oxygenation, he was discharged home. Because recurrent symptoms he returned to the ED. On his physical examination his blood pressure was 143/82, HR 80 RR 19 and 02 saturation 93%, lungs with no wheezing or rales, heart with S1 and S2 present and rhythmic, abdomen soft, positive lower extremity edema 4+ up to the knee.   Na 141, K 4,8 Cl 113 bicarbonate 20 glucose 146, bun 45 cr 2,83  BNP 257  High sensitive troponin 11 and 10  Wbc 5.4 hgb 11.0 plt 241  Toxicology screen negative   Chest radiograph with cardiomegaly with mild hilar vascular congestion. No infiltrates or effusions.  EKG 78 bpm, left axis deviation, left bundle branch block, qtc 515, sinus rhythm with J point elevation V1 to V4 with no significant T wave changes, positive LVH.   Patient was placed on furosemide for diuresis with slowly improving edema.   Continue to have elevated serum cr.     Assessment and Plan: * Acute on chronic systolic heart failure (HCC) Echocardiogram 12/2021 with reduced LV systolic function EF 35 to 40% with global hypokinesis. Moderate LVH, RV systolic function is preserved.   Urine output is 1,350 ml over last 24 hrs  Systolic blood pressure 354 mmHg.  Carvedilol for B blocker.  After load reduction with hydralazine and isosorbide  possible transition to Bidil at the time of discharge. Continue to hold on diuresis today, his edema is improving and no increase in 02 requirements.  No RAS inhibition due to reduced GFR.    Acute kidney injury superimposed on chronic kidney disease ([place]) Stage 4 CKD with baseline serum cr around 3.  Hyperkalemia.  01/2022 renal US with normal echogenicity and no hydronephrosis.  01/2022 urine analysis with 30 protein.   Follow up urine analysis with 30 protein, with no leukocytes or rbc.   Negative for hepatitis B or C Complement not low. A1c 6.9.   Improved volume status, patient has right lower extremity DVT with chronic edema.  His renal function has a serum cr of 3,38 with K at 4,3 and serum bicarbonate at 27. His baseline cr is around 3 to 2.8.   His volume looks improved, no orthopnea, no supplemental 02 requirements and improved in edema.   Possible hypertensive nephropathy. Check Spep and Upep to complete work up.  Follow up renal function in am, avoid hypotension and nephrotoxic medications.      Essential hypertension Continue carvedilol, isosorbide and hydralazine.   Type 2 diabetes mellitus with hyperlipidemia (HCC) Uncontrolled hyperglycemia.   Fasting glucose 172 mg/dl Plan to continue insulin sliding scale for glucose cover and monitoring.   Continue with statin therapy.   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  Left lower extremity with no DVT.   Continue Eliquis.  Has seen hematology recently and is advised to stay on this indefinitely due to his sedentary lifestyle/wheelchair-bound status and ongoing prostate cancer  Seizure disorder ([place]) Continue home Keppra and lacosamide  History of prostate cancer Currently follows with heme-oncology at  [facility] and on active surveillance since he was a poor candidate for standard treatment.  Obesity (BMI 30-39.9) BMI of 35        Subjective: Patient with no chest pain, edema and dyspnea continue to improve.  Physical Exam: Vitals:   04/28/22 0422 04/28/22 0611 04/28/22 0902 04/28/22 1320  BP: (!) 150/89 (!) 151/85 (!) 153/84 (!) 156/84  Pulse: 81 81 75   Resp: (!) 24 (!) 21 20   Temp: 97.9 F (36.6 C)  (!) 97 F (36.1 C)   TempSrc: Oral  Axillary   SpO2: 93% 95% 94%   Weight: 121.1 kg     Height:        Neurology awake and alert ENT with mild pallor Cardiovascular with S1 and S2 present and rhythmic with no gallops, rubs or murmurs Respiratory with no rales or wheezing Abdomen not distended No left lower extremity edema, right with + to ++ edema.  Data Reviewed:    Family Communication: no family at the bedside   Disposition: Status is: Inpatient Remains inpatient appropriate because: heart failure   Planned Discharge Destination: Skilled nursing facility    Author: Tawni Millers, MD 04/28/2022 2:34 PM  For on call review [email or web address].

## 2022-04-28 NOTE — Progress Notes (Addendum)
Physical Therapy Treatment Patient Details Name: Tim Bradley MRN: [id number] DOB: 1951-02-03 Today's Date: 04/28/2022   History of Present Illness 71 y.o. male [facility] to [facility] hospital on 04/24/2022 with SOB. PMH:  chronic systolic heart failure, NSTEMI, hypertension, DVT , DM2, OSA, prostate ca, WC bound due to severe stenosis    PT Comments    Pt lying supine flat in bed.  He was requesting his O2 but after assessment he was noted to be 94% on RA.  PTA left O2 off for tx.  He continues to require heavy mod assistance to mobilize and lack ability to progress gt at this time.  He continues to benefit from rehab in a post acute setting to maximize functional gains before returning home.  Will update recommendations to CIR as he would benefit from aggressive rehab to maximize functional gains.  Will inform sueprvising PT of need for change in recommendations at this time.     Recommendations for follow up therapy are one component of a multi-disciplinary discharge planning process, led by the attending physician.  Recommendations may be updated based on patient status, additional functional criteria and insurance authorization.  Follow Up Recommendations  Acute inpatient rehab (3hours/day)     Assistance Recommended at Discharge Frequent or constant Supervision/Assistance  Patient can return home with the following A lot of help with walking and/or transfers;A lot of help with bathing/dressing/bathroom;Assistance with cooking/housework;Assist for transportation;Help with stairs or ramp for entrance   Equipment Recommendations  None recommended by PT    Recommendations for Other Services       Precautions / Restrictions Precautions Precautions: Fall Precaution Comments: watch o2 Restrictions Weight Bearing Restrictions: No     Mobility  Bed Mobility Overal bed mobility: Needs Assistance Bed Mobility: Supine to Sit     Supine to sit: Mod assist     General bed mobility  comments: Pt required assistance to advance B LEs to edge of bed and elevate trunk into a seated position.  Once sitting edge able to maintain his balance but he did use intermittent B HHA while sitting edge of bed.  SPO2 on RA 95%    Transfers Overall transfer level: Needs assistance Equipment used: Rolling walker (2 wheels) Transfers: Sit to/from Stand, Bed to chair/wheelchair/BSC Sit to Stand: Mod assist Stand pivot transfers: Mod assist         General transfer comment: Heavy Mod assistance to rise into standing.  He required adjustement of RW when standing. Performed x 2 edge of bed. He lacks ability to progress steps more than 2-3 to turn and sit in recliner.  He reports using WC at baseline.  Pt has potential to rehab back to an ambulator.    Ambulation/Gait Ambulation/Gait assistance:  (steps to chair only.  Poor steps noted at best.)                 Stairs             Wheelchair Mobility    Modified Rankin (Stroke Patients Only)       Balance Overall balance assessment: Needs assistance Sitting-balance support: No upper extremity supported, Feet supported Sitting balance-Leahy Scale: Fair     Standing balance support: Bilateral upper extremity supported, Reliant on assistive device for balance, During functional activity Standing balance-Leahy Scale: Poor Standing balance comment: External assistance to maintain standing.  Cognition Arousal/Alertness: Awake/alert Behavior During Therapy: WFL for tasks assessed/performed Overall Cognitive Status: Within Functional Limits for tasks assessed                                          Exercises      General Comments        Pertinent Vitals/Pain Pain Assessment Pain Assessment: No/denies pain    Home Living                          Prior Function            PT Goals (current goals can now be found in the care plan section)  Acute Rehab PT Goals PT Goal Formulation: With patient Potential to Achieve Goals: Fair Progress towards PT goals: Progressing toward goals    Frequency    Min 3X/week      PT Plan Current plan remains appropriate    Co-evaluation              AM-PAC PT "6 Clicks" Mobility   Outcome Measure  Help needed turning from your back to your side while in a flat bed without using bedrails?: A Lot Help needed moving from lying on your back to sitting on the side of a flat bed without using bedrails?: A Lot Help needed moving to and from a bed to a chair (including a wheelchair)?: A Lot Help needed standing up from a chair using your arms (e.g., wheelchair or bedside chair)?: A Lot Help needed to walk in hospital room?: Total Help needed climbing 3-5 steps with a railing? : Total 6 Click Score: 10    End of Session Equipment Utilized During Treatment: Gait belt Activity Tolerance: Patient tolerated treatment well Patient left: with call bell/phone within reach;with bed alarm set;in chair;with chair alarm set Nurse Communication: Mobility status PT Visit Diagnosis: Unsteadiness on feet (R26.81);Muscle weakness (generalized) (M62.81);Difficulty in walking, not elsewhere classified (R26.2)     Time: 5110-2111 PT Time Calculation (min) (ACUTE ONLY): 32 min  Charges:  $Therapeutic Activity: 23-37 mins                     Erasmo Leventhal , PTA Acute Rehabilitation Services Office [phone]    VUDTH Y HOOILN 04/28/2022, 4:00 PM

## 2022-04-28 NOTE — TOC Progression Note (Addendum)
Transition of Care [facility]) - Progression Note    Patient Details  Name: Tim Bradley MRN: [id number] Date of Birth: 1951/08/18  Transition of Care [facility]) CM/SW [place], [place] Phone Number: 04/28/2022, 1:25 PM  Clinical Narrative:     CSW spoke with pt's sister who explained that she had spoken with a staff person at [facility] who would see if they could admit patient there. CSW notified liaison for [place] location who agreed to forward referral to [facility] location.   TOC note yesterday indicated that pt has a legal guardian "Blain Pais" listed under Active FYIS which is an error. These are two separate FYIS. There is an FYI that notes that pt is active with the [place] and lists his PCP and social worker(Erika Pearline Cables) from the [place]. There is a 2nd FYI stating "Patient has legal guardian" but there is no additional information. CSW inquired about this with pt's sister who states she is his guardian though when asked to clarify between guardianship and POA, she explains she is his POA. Pt is alert and oriented x4 and sister is assisting with DC planning though patient would ultimately be his own decision maker. In the case that he lacks capacity to make decisions, pt's sister would be responsible for decisions as POA. POA documentation was uploaded in chart on 02/03/2022; there is no guardianship documentation upon chart review.   1550: [facility] and clinicals to [facility] for SNF approval. Status pending.    Expected Discharge Plan: Skilled Nursing Facility Barriers to Discharge: Continued Medical Work up  Expected Discharge Plan and Services Expected Discharge Plan: [place] arrangements for the past 2 months: Apartment, [place]                                       Social Determinants of Health (SDOH) Interventions Food Insecurity Interventions: Intervention Not Indicated Housing  Interventions: Intervention Not Indicated Transportation Interventions: Intervention Not Indicated  Readmission Risk Interventions    10/01/2020   12:53 PM  Readmission Risk Prevention Plan  Transportation Screening Complete  Medication Review ([place]) Complete  PCP or Specialist appointment within 3-5 days of discharge Complete  HRI or [place] Complete  SW Recovery Care/Counseling Consult Complete  Palliative Care Screening Complete  [place] Patient Refused

## 2022-04-28 NOTE — Progress Notes (Signed)
Heart Failure Stewardship Pharmacist Progress Note   [facility] P[facility]-Cardiologist: Jenkins Rouge, MD    HPI:  71 yo M with PMH of CHF, NSTEMI, HTN, DVT, seizures, T2DM, OSA, wheelchair bound due to severe spinal stenosis, and prostate cancer.   He was initially diagnosed with HF in 03/2020 when he presented to the hospital complaining of leg edema and weakness. ECHO at that time with EF <20% and RV mildly reduced. He was diuresed and left AMA on 04/13/20. He was discharged with lasix, carvedilol, and BiDil in the setting of AKI.   He presented back to the hospital on 04/21/20 with acute decompensated HF. He stated at this encounter that he had not picked up the medications due to financial reasons. He was diuresed and discharged on the same medications.   He was back at the hospital in 04/2020 with acute CHF and suspected seizure. He underwent stress test at that time. Significant perfusion defect of the entire anterior and inferior wall that is slightly worse at stress. However, does not appear to be severely abnormal, and distribution does not explain global hypokinesis. Likely infarct with small amount of worsening ischemia with stress, but cardiomyopathy out of proportion to ischemia/infarction. Diuresed and discharged with carvedilol, BiDil, and lasix PRN.  Hospitalized in 11/2021 with acute CHF. He ran out of home diuretics. ECHO from OSH in Feb 2023 with EF 40-45%. Diuresed and discharged with torsemide, carvedilol, and restarted BiDil.   Presented to the ED on 7/27 with shortness of breath and LEE. CXR without edema. Last ECHO on 01/26/22 with EF 35-40%, moderate LVH, RV normal.   Current HF Medications: Beta Blocker: carvedilol 12.5 mg BID Other: hydralazine 25 mg q8h + Isordil 10 mg TID  Prior to admission HF Medications: Beta blocker: carvedilol 12.5 mg BID *unclear if he has been compliant with torsemide 20 mg BID  Pertinent Lab Values: Serum creatinine 3.38, BUN 64,  Potassium 4.3, Sodium 140, BNP 257.2, Magnesium 2.1, A1c 7.0  Vital Signs: Weight: 266 lbs (admission weight: 270 lbs) Blood pressure: 150/80s  Heart rate: 70-80s  I/O: -1.6L yesterday; net -4.1L  Medication Assistance / Insurance Benefits Check: Does the patient have prescription insurance?  Yes Type of insurance plan: DISH  Outpatient Pharmacy:  Prior to admission outpatient pharmacy: [facility] Is the patient willing to use [facility] pharmacy at discharge? Yes    Assessment: 1. Acute on chronic systolic CHF (LVEF 57-84%), due to NICM. NYHA class II symptoms. - Off IV lasix yesterday with improved volume status and worsening renal function. Strict I/O. Daily weights. Keep K>4 and Mag>2. SCr worsening (up to 3.38 today). Weight up 1 lb today. Continue to hold diuretics.  - Can consider increasing carvedilol to 18.75 BID given high BP and elevated HR. Can consider resuming at current dose to maximize renal perfusion.  - No ACE/ARB/ARNI, MRA, or SGLT2i with AKI on advanced CKD (eGFR <20) - Continue hydrazine 25 mg TID + Isordil 10 mg TID   Plan: 1) Medication changes recommended at this time: - Increase carvedilol to 18.75 mg BID   2) Patient assistance: - None needed - has [facility] benefits  3)  Education  - To be completed prior to discharge  Kerby Nora, PharmD, BCPS Heart Failure Cytogeneticist Phone [phone]

## 2022-04-29 DIAGNOSIS — N179 Acute kidney failure, unspecified: Secondary | ICD-10-CM | POA: Diagnosis not present

## 2022-04-29 DIAGNOSIS — E1169 Type 2 diabetes mellitus with other specified complication: Secondary | ICD-10-CM | POA: Diagnosis not present

## 2022-04-29 DIAGNOSIS — I5023 Acute on chronic systolic (congestive) heart failure: Secondary | ICD-10-CM | POA: Diagnosis not present

## 2022-04-29 DIAGNOSIS — I10 Essential (primary) hypertension: Secondary | ICD-10-CM | POA: Diagnosis not present

## 2022-04-29 LAB — GLUCOSE, CAPILLARY
Glucose-Capillary: 127 mg/dL — ABNORMAL HIGH (ref 70–99)
Glucose-Capillary: 150 mg/dL — ABNORMAL HIGH (ref 70–99)
Glucose-Capillary: 102 mg/dL — ABNORMAL HIGH (ref 70–99)
Glucose-Capillary: 206 mg/dL — ABNORMAL HIGH (ref 70–99)

## 2022-04-29 LAB — RENAL FUNCTION PANEL
Albumin: 3.3 g/dL — ABNORMAL LOW (ref 3.5–5.0)
Anion gap: 8 (ref 5–15)
BUN: 67 mg/dL — ABNORMAL HIGH (ref 8–23)
CO2: 25 mmol/L (ref 22–32)
Calcium: 9.1 mg/dL (ref 8.9–10.3)
Chloride: 107 mmol/L (ref 98–111)
Creatinine, Ser: 3.08 mg/dL — ABNORMAL HIGH (ref 0.61–1.24)
GFR, Estimated: 21 mL/min — ABNORMAL LOW (ref >60)
Glucose, Bld: 130 mg/dL — ABNORMAL HIGH (ref 70–99)
Phosphorus: 4 mg/dL (ref 2.5–4.6)
Potassium: 4.3 mmol/L (ref 3.5–5.1)
Sodium: 140 mmol/L (ref 135–145)

## 2022-04-29 MED ORDER — ACETAMINOPHEN 325 MG PO TABS
650.0000 mg | ORAL_TABLET | Freq: Four times a day (QID) | ORAL | Status: DC | PRN
Start: 2022-04-29 — End: 2022-05-01
  Administered 2022-04-29 – 2022-05-01 (×5): 650 mg via ORAL
  Filled 2022-04-29 (×5): qty 2

## 2022-04-29 MED ORDER — CAPSAICIN 0.025 % EX CREA
TOPICAL_CREAM | Freq: Two times a day (BID) | CUTANEOUS | Status: DC
  Filled 2022-04-29: qty 60

## 2022-04-29 NOTE — Progress Notes (Signed)
Physical Therapy Treatment Patient Details Name: Tim Bradley MRN: [id number] DOB: [birth year] Today's Date: 04/29/2022   History of Present Illness 71 y.o. male presents to [facility] hospital on 04/24/2022 with SOB. PMH:  chronic systolic heart failure, NSTEMI, hypertension, DVT , DM2, OSA, prostate ca, WC bound due to severe stenosis    PT Comments    Pt performed gt training this session with good improvement.  He remains to be limited due to weakness in core, arms and legs but is highly motivated.  Able to perform treat on RA with SPo2 greater than 90% at all time.  He remains to benefit from aggressive rehab in a post acute setting to maximize functional gains before returning home.  Pt has great potential to be ambulatory at d/c post stay at [facility] facility.      Recommendations for follow up therapy are one component of a multi-disciplinary discharge planning process, led by the attending physician.  Recommendations may be updated based on patient status, additional functional criteria and insurance authorization.  Follow Up Recommendations  Acute inpatient rehab (3hours/day)     Assistance Recommended at Discharge Frequent or constant Supervision/Assistance  Patient can return home with the following A lot of help with walking and/or transfers;A lot of help with bathing/dressing/bathroom;Assistance with cooking/housework;Assist for transportation;Help with stairs or ramp for entrance   Equipment Recommendations  None recommended by PT    Recommendations for Other Services       Precautions / Restrictions Precautions Precautions: Fall Precaution Comments: watch o2 Restrictions Weight Bearing Restrictions: No     Mobility  Bed Mobility Overal bed mobility: Needs Assistance Bed Mobility: Supine to Sit     Supine to sit: Min assist     General bed mobility comments: Min assistance to rise into sitting with HOB elevated this session and heavy use of railing.  Pt with weak core  engagement.  Once in sitting able to scoot to edge of bed and maintain balance in a seated position.    Transfers Overall transfer level: Needs assistance Equipment used: Rolling walker (2 wheels) Transfers: Sit to/from Stand, Bed to chair/wheelchair/BSC Sit to Stand: Mod assist, +2 safety/equipment           General transfer comment: Cues for hand placement to and from seated surface.  Heavy mod assistance to boost into standing.  He required assistance to move back into recliner as well due to poor eccentric load.    Ambulation/Gait Ambulation/Gait assistance: Mod assist, +2 safety/equipment Gait Distance (Feet): 6 Feet Assistive device: Rolling walker (2 wheels) Gait Pattern/deviations: Step-to pattern, Knees buckling, Trunk flexed, Decreased step length - left, Decreased step length - right, Shuffle       General Gait Details: Poor foot clearance due to weakness.  Pt was able to progress to short bout of gt training with close chair follow.  Pt is eager to mobilize and improve his walking. He was impressed with his progress.   Stairs             Wheelchair Mobility    Modified Rankin (Stroke Patients Only)       Balance Overall balance assessment: Needs assistance Sitting-balance support: No upper extremity supported, Feet supported Sitting balance-Leahy Scale: Fair       Standing balance-Leahy Scale: Poor Standing balance comment: External assistance to maintain standing.                            Cognition Arousal/Alertness:  Awake/alert Behavior During Therapy: WFL for tasks assessed/performed Overall Cognitive Status: Within Functional Limits for tasks assessed                                          Exercises General Exercises - Lower Extremity Hip Flexion/Marching: AROM, Both, 10 reps, Seated    General Comments        Pertinent Vitals/Pain Pain Assessment Pain Assessment: No/denies pain    Home Living                           Prior Function            PT Goals (current goals can now be found in the care plan section) Acute Rehab PT Goals PT Goal Formulation: With patient Potential to Achieve Goals: Fair Progress towards PT goals: Progressing toward goals    Frequency    Min 3X/week      PT Plan Current plan remains appropriate    Co-evaluation              AM-PAC PT "6 Clicks" Mobility   Outcome Measure  Help needed turning from your back to your side while in a flat bed without using bedrails?: A Lot Help needed moving from lying on your back to sitting on the side of a flat bed without using bedrails?: A Lot Help needed moving to and from a bed to a chair (including a wheelchair)?: A Lot Help needed standing up from a chair using your arms (e.g., wheelchair or bedside chair)?: A Lot Help needed to walk in hospital room?: A Lot Help needed climbing 3-5 steps with a railing? : Total 6 Click Score: 11    End of Session Equipment Utilized During Treatment: Gait belt Activity Tolerance: Patient tolerated treatment well Patient left: with call bell/phone within reach;in chair;with chair alarm set;with nursing/sitter in room Nurse Communication: Mobility status PT Visit Diagnosis: Unsteadiness on feet (R26.81);Muscle weakness (generalized) (M62.81);Difficulty in walking, not elsewhere classified (R26.2)     Time: 1130-1157 PT Time Calculation (min) (ACUTE ONLY): 27 min  Charges:  $Gait Training: 8-22 mins $Therapeutic Activity: 8-22 mins                     Erasmo Leventhal , PTA Acute Rehabilitation Services Office [phone]    [name] 04/29/2022, 12:19 PM

## 2022-04-29 NOTE — Progress Notes (Signed)
Progress Note   Patient: Tim Bradley TDS:[id number] DOB: 09-14-1951 DOA: 04/24/2022     4 DOS: the patient was seen and examined on 04/29/2022   Brief hospital course: Tim Bradley was admitted to the hospital with the working diagnosis of decompensated heart failure.   71 yo male with the past medical history of heart failure, hypertension, DVT, T2DM, seizures, and prostate cancer, severe spinal stenosis, wheelchair bound who presented with dyspnea. Reported worsening dyspnea along with lower extremity edema, suspected medical non compliance. The day before hospitalization he was treated for volume overload in the ED with IV furosemide with improvement in his oxygenation, he was discharged home. Because recurrent symptoms he returned to the ED. On his physical examination his blood pressure was 143/82, HR 80 RR 19 and 02 saturation 93%, lungs with no wheezing or rales, heart with S1 and S2 present and rhythmic, abdomen soft, positive lower extremity edema 4+ up to the knee.   Na 141, K 4,8 Cl 113 bicarbonate 20 glucose 146, bun 45 cr 2,83  BNP 257  High sensitive troponin 11 and 10  Wbc 5.4 hgb 11.0 plt 241  Toxicology screen negative   Chest radiograph with cardiomegaly with mild hilar vascular congestion. No infiltrates or effusions.  EKG 78 bpm, left axis deviation, left bundle branch block, qtc 515, sinus rhythm with J point elevation V1 to V4 with no significant T wave changes, positive LVH.   Patient was placed on furosemide for diuresis with slowly improving edema.   His renal function is stable with serum cr at 3,0 Pending inpatient rehab evaluation, possible transfer to CIR.    Assessment and Plan: * Acute on chronic systolic heart failure (HCC) Echocardiogram 12/2021 with reduced LV systolic function EF 35 to 40% with global hypokinesis. Moderate LVH, RV systolic function is preserved.   Urine output is 1,700 ml over last 24 hrs  Systolic blood pressure 262 to 140  mmHg.  Continue with carvedilol and Bidil with good toleration. Continue to hold on diuresis today, his edema is improving and no increase in 02 requirements.  No RAS inhibition due to reduced GFR.    Acute kidney injury superimposed on chronic kidney disease ([place]) Stage 4 CKD with baseline serum cr around 3.  Hyperkalemia.  01/2022 renal US with normal echogenicity and no hydronephrosis.  01/2022 urine analysis with 30 protein.   Follow up urine analysis with 30 protein, with no leukocytes or rbc.   Negative for hepatitis B or C Complement not low. A1c 6.9.   Improved volume status, patient has right lower extremity DVT with chronic edema.  Adequate urine output and improving edema.  Today renal function with serum cr at 3,0 with K at 4,3 and serum bicarbonate at 25.  Possible hypertensive nephropathy. Check Spep and Upep to complete work up.  Follow up renal function in am, avoid hypotension and nephrotoxic medications.      Essential hypertension Continue carvedilol, isosorbide and hydralazine.   Type 2 diabetes mellitus with hyperlipidemia (HCC) Uncontrolled hyperglycemia.   Fasting glucose 130 mg/dl Plan to continue insulin sliding scale for glucose cover and monitoring.   Continue with statin therapy.   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 Left lower extremity with no DVT.   Continue Eliquis.  Has seen hematology recently and is advised to  stay on this indefinitely due to his sedentary lifestyle/wheelchair-bound status and ongoing prostate cancer  Seizure disorder ([place]) Continue home Keppra and lacosamide  History of prostate cancer Currently follows with heme-oncology at [facility] and on active surveillance since he was a poor candidate for  standard treatment.  Obesity (BMI 30-39.9) BMI of 35        Subjective: Patient with improvement in dyspnea and edema, this am is out of bed to chair at the side of the bed. Had pain in his feet and legs.   Physical Exam: Vitals:   04/28/22 1950 04/29/22 0058 04/29/22 0254 04/29/22 0300  BP: (!) 152/84 (!) 149/81 134/85   Pulse: 71 74 77 77  Resp: 17 18 18 18   Temp: 97.8 F (36.6 C) 98.1 F (36.7 C) 98.1 F (36.7 C)   TempSrc: Oral Oral Oral Oral  SpO2: 94% 93% 94% 94%  Weight:   121.2 kg   Height:       Neurology awake and alert ENT with mild pallor Cardiovascular with S1 and S2 present and rhythmic with no gallops, rubs or murmurs Respiratory with no rales or wheezing Abdomen with no distention  Positive right lower extremity edema + and trace on the left.  Data Reviewed:    Family Communication: no family at the bedside   Disposition: Status is: Inpatient Remains inpatient appropriate because: recovering heart and renal failure pending CIR   Planned Discharge Destination:  to be determined       Author: Tawni Millers, MD 04/29/2022 1:12 PM  For on call review [email or web address].

## 2022-04-29 NOTE — Progress Notes (Signed)
Heart Failure Stewardship Pharmacist Progress Note   PCP: [facility] PCP-Cardiologist: Jenkins Rouge, MD    HPI:  71 yo M with PMH of CHF, NSTEMI, HTN, DVT, seizures, T2DM, OSA, wheelchair bound due to severe spinal stenosis, and prostate cancer.   He was initially diagnosed with HF in 03/2020 when he presented to the hospital complaining of leg edema and weakness. ECHO at that time with EF <20% and RV mildly reduced. He was diuresed and left AMA on 04/13/20. He was discharged with lasix, carvedilol, and BiDil in the setting of AKI.   He presented back to the hospital on 04/21/20 with acute decompensated HF. He stated at this encounter that he had not picked up the medications due to financial reasons. He was diuresed and discharged on the same medications.   He was back at the hospital in 04/2020 with acute CHF and suspected seizure. He underwent stress test at that time. Significant perfusion defect of the entire anterior and inferior wall that is slightly worse at stress. However, does not appear to be severely abnormal, and distribution does not explain global hypokinesis. Likely infarct with small amount of worsening ischemia with stress, but cardiomyopathy out of proportion to ischemia/infarction. Diuresed and discharged with carvedilol, BiDil, and lasix PRN.  Hospitalized in 11/2021 with acute CHF. He ran out of home diuretics. ECHO from OSH in Feb 2023 with EF 40-45%. Diuresed and discharged with torsemide, carvedilol, and restarted BiDil.   Presented to the ED on 7/27 with shortness of breath and LEE. CXR without edema. Last ECHO on 01/26/22 with EF 35-40%, moderate LVH, RV normal.   Current HF Medications: Beta Blocker: carvedilol 12.5 mg BID Other: BiDil 20/37.5 mg 1 tab TID  Prior to admission HF Medications: Beta blocker: carvedilol 12.5 mg BID *unclear if he has been compliant with torsemide 20 mg BID  Pertinent Lab Values: Serum creatinine 3.08, BUN 67, Potassium 4.3,  Sodium 140, BNP 257.2, Magnesium 2.1, A1c 7.0  Vital Signs: Weight: 267 lbs (admission weight: 270 lbs) Blood pressure: 130-150/80s  Heart rate: 70s  I/O: -1.4L yesterday; net -5.8L (questionable accuracy of documented I/Os)  Medication Assistance / Insurance Benefits Check: Does the patient have prescription insurance?  Yes Type of insurance plan: [place]  Outpatient Pharmacy:  Prior to admission outpatient pharmacy: [facility] Is the patient willing to use [facility] at discharge? Yes    Assessment: 1. Acute on chronic systolic CHF (LVEF 35-00%), due to NICM. NYHA class II symptoms. - Off IV lasix with improved volume status and worsening renal function. SCr slightly improved today. Strict I/O. Daily weights. Keep K>4 and Mag>2. Weight up another 1 lb today. May need to consider restarting PO diuretics.  - Continue carvedilol 12.5 mg BID - No ACE/ARB/ARNI, MRA, or SGLT2i with AKI on advanced CKD (eGFR <20) - Continue BiDil 20/37.5 mg 1 tab TID   Plan: 1) Medication changes recommended at this time: - Restart PO diuretics tomorrow if SCr stable  2) Patient assistance: - None needed - has [employer] benefits - Possible CIR at discharge  3)  Education  - To be completed prior to discharge  Kerby Nora, PharmD, BCPS Heart Failure Cytogeneticist Phone [phone]

## 2022-04-29 NOTE — Progress Notes (Signed)
Inpatient Rehab Admissions Coordinator:  ° °Per therapy recommendations patient was screened for CIR candidacy by Despina Boan, MS, CCC-SLP. At this time, Pt. Appears to be a a potential candidate for CIR. I will place   order for rehab consult per protocol for full assessment. Please contact me any with questions. ° °Mable Lashley, MS, CCC-SLP °Rehab Admissions Coordinator  °[phone] (celll) °[phone] (office) ° °

## 2022-04-29 NOTE — Progress Notes (Signed)
Inpatient Rehab Admissions Coordinator:   Consult received and chart reviewed extensively.  Discussed with LCSW, Cyrus, who has spoken to family.  Per family they are unable to provide 24/7 care for pt any longer and are hopeful to continue pursuit on SNF placement.  CIR will sign off at this time.   Shann Medal, PT, DPT Admissions Coordinator [phone] 04/29/22  1:14 PM

## 2022-04-29 NOTE — Progress Notes (Signed)
Mobility Specialist Progress Note:   04/29/22 1424  Mobility  Activity Transferred from chair to bed  Level of Assistance Moderate assist, patient does 50-74% (+2)  Distance Ambulated (ft) 1 ft  Activity Response Tolerated well  $Mobility charge 1 Mobility   Pt in chair requesting to be transferred to bed. ModA+2 to stand and step to bed. Requires cues to step with left foot. Pt left in bed with all needs met.   Tim Bradley Mobility Specialist-Acute Rehab Secure Chat only

## 2022-04-30 DIAGNOSIS — I5023 Acute on chronic systolic (congestive) heart failure: Secondary | ICD-10-CM | POA: Diagnosis not present

## 2022-04-30 DIAGNOSIS — E1169 Type 2 diabetes mellitus with other specified complication: Secondary | ICD-10-CM | POA: Diagnosis not present

## 2022-04-30 DIAGNOSIS — N179 Acute kidney failure, unspecified: Secondary | ICD-10-CM | POA: Diagnosis not present

## 2022-04-30 DIAGNOSIS — I10 Essential (primary) hypertension: Secondary | ICD-10-CM | POA: Diagnosis not present

## 2022-04-30 LAB — RENAL FUNCTION PANEL
Albumin: 3.3 g/dL — ABNORMAL LOW (ref 3.5–5.0)
Anion gap: 7 (ref 5–15)
BUN: 66 mg/dL — ABNORMAL HIGH (ref 8–23)
CO2: 24 mmol/L (ref 22–32)
Calcium: 9 mg/dL (ref 8.9–10.3)
Chloride: 108 mmol/L (ref 98–111)
Creatinine, Ser: 2.8 mg/dL — ABNORMAL HIGH (ref 0.61–1.24)
GFR, Estimated: 23 mL/min — ABNORMAL LOW (ref >60)
Glucose, Bld: 148 mg/dL — ABNORMAL HIGH (ref 70–99)
Phosphorus: 4.2 mg/dL (ref 2.5–4.6)
Potassium: 4.5 mmol/L (ref 3.5–5.1)
Sodium: 139 mmol/L (ref 135–145)

## 2022-04-30 LAB — GLUCOSE, CAPILLARY
Glucose-Capillary: 209 mg/dL — ABNORMAL HIGH (ref 70–99)
Glucose-Capillary: 148 mg/dL — ABNORMAL HIGH (ref 70–99)
Glucose-Capillary: 126 mg/dL — ABNORMAL HIGH (ref 70–99)
Glucose-Capillary: 146 mg/dL — ABNORMAL HIGH (ref 70–99)

## 2022-04-30 MED ORDER — ACETAMINOPHEN 325 MG PO TABS: 650.0000 mg | ORAL_TABLET | Freq: Four times a day (QID) | ORAL | Status: DC | PRN

## 2022-04-30 MED ORDER — LIDOCAINE 5 % EX PTCH
1.0000 | MEDICATED_PATCH | CUTANEOUS | Status: DC
  Administered 2022-04-30: 1 via TRANSDERMAL
  Filled 2022-04-30 (×2): qty 1

## 2022-04-30 MED ORDER — FUROSEMIDE 40 MG PO TABS
60.0000 mg | ORAL_TABLET | Freq: Every day | ORAL | Status: DC
  Administered 2022-04-30 – 2022-05-01 (×2): 60 mg via ORAL
  Filled 2022-04-30 (×2): qty 1

## 2022-04-30 MED ORDER — CARVEDILOL 6.25 MG PO TABS: 6.2500 mg | ORAL_TABLET | Freq: Two times a day (BID) | ORAL | Status: DC

## 2022-04-30 MED ORDER — CARVEDILOL 12.5 MG PO TABS: 6.2500 mg | ORAL_TABLET | Freq: Two times a day (BID) | ORAL | 0 refills | Status: DC

## 2022-04-30 MED ORDER — CAPSAICIN 0.025 % EX CREA: TOPICAL_CREAM | Freq: Two times a day (BID) | CUTANEOUS | 0 refills | Status: DC

## 2022-04-30 MED ORDER — CARVEDILOL 12.5 MG PO TABS: 12.5000 mg | ORAL_TABLET | Freq: Two times a day (BID) | ORAL | 0 refills | Status: DC

## 2022-04-30 MED ORDER — ISOSORB DINITRATE-HYDRALAZINE 20-37.5 MG PO TABS: 1.0000 | ORAL_TABLET | Freq: Three times a day (TID) | ORAL | 0 refills | Status: DC

## 2022-04-30 MED ORDER — FUROSEMIDE 20 MG PO TABS
60.0000 mg | ORAL_TABLET | Freq: Every day | ORAL | 0 refills | Status: DC
Start: 2022-04-30 — End: 2022-05-15

## 2022-04-30 NOTE — Discharge Summary (Addendum)
Physician Discharge Summary   Patient: Tim Bradley MRN: [id number] DOB: [birth year]  Admit date:     04/24/2022  Discharge date: 04/30/22  Discharge Physician: Tawni Millers   [facility]   Recommendations at discharge:    Patient will continue diuresis with furosemide 60 mg daily Follow up renal function in 7 days Follow up on results of urine and serum protein electrophoresis.  Continue carvedilol and added Bidil for blood pressure control.   Discharge Diagnoses: Principal Problem:   Acute on chronic systolic heart failure (HCC) Active Problems:   Essential hypertension   Acute kidney injury superimposed on chronic kidney disease (HCC)   Type 2 diabetes mellitus with hyperlipidemia (HCC)   DVT (deep venous thrombosis) (HCC)   Seizure disorder (HCC)   History of prostate cancer   Obesity (BMI 30-39.9)  Resolved Problems:   * No resolved hospital problems. [facility] Course: Tim Bradley was admitted to the hospital with the working diagnosis of decompensated heart failure.   71 yo male with the past medical history of heart failure, hypertension, DVT, T2DM, seizures, and prostate cancer, severe spinal stenosis, wheelchair bound who presented with dyspnea. Reported worsening dyspnea along with lower extremity edema, suspected medical non compliance. The day before hospitalization he was treated for volume overload in the ED with IV furosemide with improvement in his oxygenation, he was discharged home. Because recurrent symptoms he returned to the ED. On his physical examination his blood pressure was 143/82, HR 80 RR 19 and 02 saturation 93%, lungs with no wheezing or rales, heart with S1 and S2 present and rhythmic, abdomen soft, positive lower extremity edema 4+ up to the knee.   Na 141, K 4,8 Cl 113 bicarbonate 20 glucose 146, bun 45 cr 2,83  BNP 257  High sensitive troponin 11 and 10  Wbc 5.4 hgb 11.0 plt 241  Toxicology screen negative   Chest  radiograph with cardiomegaly with mild hilar vascular congestion. No infiltrates or effusions.  EKG 78 bpm, left axis deviation, left bundle branch block, qtc 515, sinus rhythm with J point elevation V1 to V4 with no significant T wave changes, positive LVH.   Patient was placed on furosemide for diuresis with slowly improving edema.   Prolonged hospitalization due to worsening renal function. At the time of his discharge his renal function is improving.  Not candidate for inpatient rehab due to no outpatient family support 24/7 after his discharge.     Assessment and Plan: * Acute on chronic systolic heart failure (HCC) Echocardiogram 12/2021 with reduced LV systolic function EF 35 to 40% with global hypokinesis. Moderate LVH, RV systolic function is preserved.   Patient was placed on IV furosemide for aggressive diuresis, negative fluid balance was achieved -6,393 ml with improvement in his symptoms.   Patient will continue diuresis with oral furosemide 60 mg daily and will need outpatient follow up renal function.  Continue with carvedilol and Bidil with good toleration. No RAS inhibition due to reduced GFR.    Acute kidney injury superimposed on chronic kidney disease ([place]) Stage 4 CKD with baseline serum cr around 3.  Hyperkalemia.  01/2022 renal US with normal echogenicity and no hydronephrosis.  01/2022 urine analysis with 30 protein.   Follow up urine analysis with 30 protein, with no leukocytes or rbc.   Negative for hepatitis B or C Complement not low. A1c 6.9.   Improved volume status, patient has right lower extremity DVT with chronic edema.  His renal function is back to baseline, will resume diuresis with furosemide 60 mg daily.   Possible hypertensive nephropathy. Pending Spep and Upep to complete work up.  Follow up renal function as outpatient.      Essential hypertension Continue carvedilol, isosorbide and hydralazine.   Type 2 diabetes mellitus with  hyperlipidemia (HCC) Uncontrolled hyperglycemia.   Fasting glucose 148 mg/dl Patient was placed on insulin sliding scale for glucose cover and monitoring during his hospitalization with good toleration.   Continue with statin therapy.   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 Left lower extremity with no DVT.   Continue Eliquis.  Has seen hematology recently and is advised to stay on this indefinitely due to his sedentary lifestyle/wheelchair-bound status and ongoing prostate cancer  Seizure disorder ([place]) Continue home Keppra and lacosamide  History of prostate cancer Currently follows with heme-oncology at [facility] and on active surveillance since he was a poor candidate for standard treatment.  Obesity (BMI 30-39.9) BMI of 35         Consultants: none  Procedures performed: none   Disposition: Skilled nursing facility Diet recommendation:  Cardiac and Carb modified diet DISCHARGE MEDICATION: Allergies as of 04/30/2022       Reactions   Gabapentin    unknown   Tramadol    unknown   Trazodone And Nefazodone Other (See Comments)   Family reports seizures from this medication        Medication List     STOP taking these medications    diclofenac Sodium 1 % Gel Commonly known as: VOLTAREN   HYDROcodone-acetaminophen 5-325 MG tablet Commonly known as: NORCO/VICODIN       TAKE these medications    acetaminophen 325 MG tablet Commonly known as: TYLENOL Take 2 tablets (650 mg total) by mouth every 6 (six) hours as needed for mild pain or headache.   apixaban 5 MG Tabs tablet Commonly known as: ELIQUIS Take 1 tablet (5 mg total) by mouth 2 (two) times daily.   atorvastatin 80 MG tablet Commonly known as: LIPITOR Take 1 tablet (80 mg total) by  mouth daily.   capsaicin 0.025 % cream Commonly known as: ZOSTRIX Apply topically 2 (two) times daily.   carvedilol 12.5 MG tablet Commonly known as: Coreg Take 1 tablet (12.5 mg total) by mouth 2 (two) times daily. What changed:  medication strength how much to take when to take this   finasteride 5 MG tablet Commonly known as: PROSCAR Take 1 tablet by mouth daily.   furosemide 20 MG tablet Commonly known as: LASIX Take 3 tablets (60 mg total) by mouth daily.   isosorbide-hydrALAZINE 20-37.5 MG tablet Commonly known as: BIDIL Take 1 tablet by mouth 3 (three) times daily.   Lacosamide 100 MG Tabs Take 100 mg by mouth 2 (two) times daily.   levETIRAcetam 750 MG tablet Commonly known as: KEPPRA Take 1 tablet (750 mg total) by mouth 2 (two) times daily.   Magnesium Oxide 420 MG Tabs Take 420 mg by mouth daily.   mometasone-formoterol 200-5 MCG/ACT Aero Commonly known as: DULERA Inhale 2 puffs into the lungs 2 (two) times daily.   naloxone 4 MG/0.1ML Liqd nasal spray kit Commonly known as: NARCAN Place 1 spray into the nose as needed (opioid overdose).   QC Vitamin D3 50 MCG (2000  UT) Tabs Generic drug: Cholecalciferol Take 2,000 Units by mouth daily.        Follow-up Information     [facility]. Go in 15 day(s).   Specialty: Cardiology Why: Hospital follow up PLEASE bring current medication list toappointment FREE valet parking, Entrance C, off of [employer] information: [address] [id number] [place] [phone]               Discharge Exam: Filed Weights   04/28/22 0422 04/29/22 0254 04/30/22 0320  Weight: 121.1 kg 121.2 kg 121.6 kg   BP (!) 165/92 (BP Location: Right Arm)   Pulse 75   Temp 98 F (36.7 C) (Oral)   Resp 20   Ht 6' 2"  (1.88 m)   Wt 121.6 kg   SpO2 99%   BMI 34.42 kg/m   Patient is feeling better, no chest pain.    Neurology awake and alert ENT with mild pallor Cardiovascular with S1 and S2 present and rhythmic with no gallops Respiratory with no wheezing Abdomen not distended Lower extremity with trace edema right more than left   Condition at discharge: stable  The results of significant diagnostics from this hospitalization (including imaging, microbiology, ancillary and laboratory) are listed below for reference.   Imaging Studies: DG Chest Portable 1 View  Result Date: 04/24/2022 CLINICAL DATA:  Shortness of breath, difficulty breathing EXAM: PORTABLE CHEST 1 VIEW COMPARISON:  04/23/2022 FINDINGS: Lungs are clear.  No pleural effusion or pneumothorax. The heart is top-normal in size.  Thoracic aortic atherosclerosis. IMPRESSION: No evidence of acute cardiopulmonary disease. Electronically Signed   By: Julian Hy M.D.   On: 04/24/2022 21:12   DG Chest Port 1 View  Result Date: 04/23/2022 CLINICAL DATA:  Shortness of breath.  History of CHF. EXAM: PORTABLE CHEST 1 VIEW COMPARISON:  02/19/2022 FINDINGS: The heart is borderline enlarged but stable. Left ventricular configuration. Stable tortuosity and calcification of the thoracic aorta. The lungs are clear of an acute process. No infiltrates, edema or effusions. No pneumothorax. The bony thorax is intact. IMPRESSION: No acute cardiopulmonary findings. Electronically Signed   By: Marijo Sanes M.D.   On: 04/23/2022 08:52    Microbiology: Results for orders placed or performed during the hospital encounter of 04/23/22  Resp Panel by RT-PCR (Flu A&B, Covid) Anterior Nasal Swab     Status: None   Collection Time: 04/23/22  8:59 AM   Specimen: Anterior Nasal Swab  Result Value Ref Range Status   SARS Coronavirus 2 by RT PCR NEGATIVE NEGATIVE Final    Comment: (NOTE) SARS-CoV-2 target nucleic acids are NOT DETECTED.  The SARS-CoV-2 RNA is generally detectable in upper respiratory specimens during the acute phase of infection. The  lowest concentration of SARS-CoV-2 viral copies this assay can detect is 138 copies/mL. A negative result does not preclude SARS-Cov-2 infection and should not be used as the sole basis for treatment or other patient management decisions. A negative result may occur with  improper specimen collection/handling, submission of specimen other than nasopharyngeal swab, presence of viral mutation(s) within the areas targeted by this assay, and inadequate number of viral copies(<138 copies/mL). A negative result must be combined with clinical observations, patient history, and epidemiological information. The expected result is Negative.  Fact Sheet for Patients:  EntrepreneurPulse.com.au  Fact Sheet for Healthcare Providers:  IncredibleEmployment.be  This test is no t yet approved or cleared by the [place] FDA and  has  been authorized for detection and/or diagnosis of SARS-CoV-2 by FDA under an Emergency Use Authorization (EUA). This EUA will remain  in effect (meaning this test can be used) for the duration of the COVID-19 declaration under Section 564(b)(1) of the Act, 21 U.S.C.section 360bbb-3(b)(1), unless the authorization is terminated  or revoked sooner.       Influenza A by PCR NEGATIVE NEGATIVE Final   Influenza B by PCR NEGATIVE NEGATIVE Final    Comment: (NOTE) The Xpert Xpress SARS-CoV-2/FLU/RSV plus assay is intended as an aid in the diagnosis of influenza from Nasopharyngeal swab specimens and should not be used as a sole basis for treatment. Nasal washings and aspirates are unacceptable for Xpert Xpress SARS-CoV-2/FLU/RSV testing.  Fact Sheet for Patients: EntrepreneurPulse.com.au  Fact Sheet for Healthcare Providers: IncredibleEmployment.be  This test is not yet approved or cleared by the [place] FDA and has been authorized for detection and/or diagnosis of SARS-CoV-2 by FDA under  an Emergency Use Authorization (EUA). This EUA will remain in effect (meaning this test can be used) for the duration of the COVID-19 declaration under Section 564(b)(1) of the Act, 21 U.S.C. section 360bbb-3(b)(1), unless the authorization is terminated or revoked.  Performed at [facility], [place] [address] [place], [place] [address]     Labs: CBC: Recent Labs  Lab 04/24/22 2109 04/25/22 0542  WBC 5.3 4.9  NEUTROABS 3.3  --   HGB 11.0* 11.4*  HCT 34.7* 36.5*  MCV 88.1 89.7  PLT 241 371   Basic Metabolic Panel: Recent Labs  Lab 04/26/22 0047 04/27/22 0956 04/28/22 0652 04/29/22 0312 04/30/22 0254  NA 138 137 140 140 139  K 4.6 5.0 4.3 4.3 4.5  CL 106 109 107 107 108  CO2 24 19* 27 25 24   GLUCOSE 216* 171* 201* 130* 148*  BUN 55* 59* 64* 67* 66*  CREATININE 2.97* 3.17* 3.38* 3.08* 2.80*  CALCIUM 8.8* 8.6* 8.8* 9.1 9.0  MG 2.0  --  2.1  --   --   PHOS 4.5  --   --  4.0 4.2   Liver Function Tests: Recent Labs  Lab 04/24/22 2109 04/26/22 0047 04/29/22 0312 04/30/22 0254  AST 18  --   --   --   ALT 15  --   --   --   ALKPHOS 68  --   --   --   BILITOT 0.5  --   --   --   PROT 6.5  --   --   --   ALBUMIN 3.3* 3.3* 3.3* 3.3*   CBG: Recent Labs  Lab 04/29/22 1121 04/29/22 1647 04/29/22 2122 04/30/22 0610 04/30/22 1129  GLUCAP 150* 102* 206* 209* 148*    Discharge time spent: greater than 30 minutes.  Signed: Tawni Millers, MD [facility] 04/30/2022

## 2022-04-30 NOTE — Progress Notes (Signed)
Occupational Therapy Treatment Patient Details Name: Tim Bradley MRN: [id number] DOB: Feb 14, 1951 Today's Date: 04/30/2022   History of present illness 71 y.o. male presents to [facility] hospital on [facility]/24/2022 with SOB. PMH:  chronic systolic heart failure, NSTEMI, hypertension, DVT , DM2, OSA, prostate ca, WC bound due to severe stenosis   OT comments  Pt in bed upon therapy arrival. Pt reports that he has been doing a lot today and has worked with therapy on walking and getting into the chair. He verbalizes fatigue although agreeable to participate in OT session. Pt verbalizes difficulty catching his breathe at rest. SpO2 98% with supplemental oxygen at 2L in bed at rest. OT provided education on breathing exercise of pursed lip breathing. After receiving VC and visual demonstration, pt completed with intermittent VC for correct form and technique. Handout provided for patient to reference. Handout with 2 additional breathing exercises to complete sitting up in chair or EOB. As pt is exhibiting signs of drowsiness and fatigue, sitting on EOB was not completed during this session. Pt left in bed with all needs met. Pt reports that he will be discharging tomorrow.   Recommendations for follow up therapy are one component of a multi-disciplinary discharge planning process, led by the attending physician.  Recommendations may be updated based on patient status, additional functional criteria and insurance authorization.    Follow Up Recommendations  Skilled nursing-short term rehab (<3 hours/day)    Assistance Recommended at Discharge Frequent or constant Supervision/Assistance  Patient can return home with the following  Two people to help with walking and/or transfers;Two people to help with bathing/dressing/bathroom;Assistance with cooking/housework;Assistance with feeding;Direct supervision/assist for medications management;Direct supervision/assist for financial management;Assist for transportation          Precautions / Restrictions Precautions Precautions: Fall Precaution Comments: watch O2 Restrictions Weight Bearing Restrictions: No           Cognition Arousal/Alertness: Awake/alert (drowsy) Behavior During Therapy: WFL for tasks assessed/performed Overall Cognitive Status: Within Functional Limits for tasks assessed            Exercises Other Exercises Other Exercises: Patient education provided today including Pursed Lip Breathing. Completed in bed with patient semi reclined. 10X total. OT provided verbal and visual demonstration prior to patient completing. handout provided.       General Comments 100% SpO2 on 1.5L supplemental O2. However WOB increased throughout session. HOB elevated >30 deg end of session.    Pertinent Vitals/ Pain       Pain Assessment Pain Assessment: No/denies pain         Frequency  Min 2X/week        Progress Toward Goals  OT Goals(current goals can now be found in the care plan section)  Progress towards OT goals: Progressing toward goals     Plan Discharge plan remains appropriate;Frequency remains appropriate       AM-PAC OT "6 Clicks" Daily Activity     Outcome Measure   Help from another person eating meals?: A Little Help from another person taking care of personal grooming?: A Little Help from another person toileting, which includes using toliet, bedpan, or urinal?: Total Help from another person bathing (including washing, rinsing, drying)?: A Lot Help from another person to put on and taking off regular upper body clothing?: A Little Help from another person to put on and taking off regular lower body clothing?: A Lot 6 Click Score: 14    End of Session    OT Visit Diagnosis: Unsteadiness  on feet (R26.81);Other abnormalities of gait and mobility (R26.89);Muscle weakness (generalized) (M62.81)   Activity Tolerance Patient limited by fatigue;Patient tolerated treatment well   Patient Left in bed;with call  bell/phone within reach;with bed alarm set           Time: 6435-3912 OT Time Calculation (min): 24 min  Charges: OT General Charges $OT Visit: 1 Visit OT Treatments $Therapeutic Activity: 23-37 mins  Ailene Ravel, OTR/L,CBIS  Supplemental OT - [facility] and [facility]   Travious Vanover, Clarene Duke 04/30/2022, 3:41 PM

## 2022-04-30 NOTE — TOC Progression Note (Addendum)
Transition of Care [facility]) - Progression Note    Patient Details  Name: TRAYTON SZABO MRN: [id number] Date of Birth: 1951/08/30  Transition of Care [facility]) CM/SW [place], [place] Phone Number: 04/30/2022, 11:54 AM  Clinical Narrative:     CSW received call from [facility] and is notified that pt is approved for [facility] day [facility] [employer]. CSW is informed that [place] Prior with the [facility] will need to be notified of DC date and facility name prior to [place]. Phone # [phone]  CSW called pt's sister and updated her that [facility] denied. CSW notified sister that pt is approved for 30 day [facility] [employer] [facility]. Daughter is interest in [place] at [facility]. CSW faxed referral to them. CSW called [facility] back and left message requesting updated [facility] list of contracted SNFs.   CSW called sister back to request her email address to email [facility] contract List.   Expected Discharge Plan: Skilled Nursing Facility Barriers to Discharge: Continued Medical Work up  Expected Discharge Plan and Services Expected Discharge Plan: [place] arrangements for the past 2 months: Apartment, [place]                                       Social Determinants of Health (SDOH) Interventions Food Insecurity Interventions: Intervention Not Indicated Housing Interventions: Intervention Not Indicated Transportation Interventions: Intervention Not Indicated  Readmission Risk Interventions    10/01/2020   12:53 PM  Readmission Risk Prevention Plan  Transportation Screening Complete  Medication Review ([place]) Complete  PCP or Specialist appointment within 3-5 days of discharge Complete  HRI or [place] Complete  SW Recovery Care/Counseling Consult Complete  Palliative Care Screening Complete  [place] Patient Refused

## 2022-04-30 NOTE — Progress Notes (Signed)
Mobility Specialist Progress Note:   04/30/22 1049  Mobility  Activity Transferred from bed to chair  Level of Assistance Moderate assist, patient does 50-74% (+2)  Assistive Device Front wheel walker  Distance Ambulated (ft) 2 ft  Activity Response Tolerated well  $Mobility charge 1 Mobility   Pt received in bed willing to participate in mobility. No complaints of pain. ModA+2 to stand from bed. Left in chair with call bell in reach and all needs met.   [facility] Jaionna Weisse Mobility Specialist

## 2022-04-30 NOTE — TOC Progression Note (Addendum)
Transition of Care [facility]) - Progression Note    Patient Details  Name: Tim Bradley MRN: [id number] Date of Birth: 1951/06/18  Transition of Care [facility]) CM/SW [place], [place] Phone Number: 04/30/2022, 11:56 AM  Clinical Narrative:     CSW received call from [place] and requested updated SNF list for [facility]; CSW received email with list. CSW forwarded list to pt's sister at [email or web address].   CSW called multiple facilities on the list that notified CSW they are in fact not in network with [place] contract.   CSW called and left message for [name]; she called back and informed CSW that pt is actually active with [name] and social worker [name]; [phone] ext. [phone]. CSW called and left [name] voicemail requesting return call.   1300: [name] has offered bed for pt and can accept tomorrow. CSW notified pt and pt sister bedside; they are agreeable. Plan for DC to [facility] SNF tomorrow.   1515: CSW called [name] with [place] at [phone] to notify her of pt's anticipated DC to [facility] tomorrow. She states that they are not on the [facility] contract SNF list and instead that [place] is [facility] list. [name] stated that patient should be fine to DC there tomorrow and [name] will work on switching pt's snf approval from [place] to the [facility] approval. CSW updated [place] liaison.   Expected Discharge Plan: Skilled Nursing Facility Barriers to Discharge: Continued Medical Work up  Expected Discharge Plan and Services Expected Discharge Plan: [place] arrangements for the past 2 months: Apartment, [place]                                       Social Determinants of Health (SDOH) Interventions Food Insecurity Interventions: Intervention Not Indicated Housing Interventions: Intervention Not  Indicated Transportation Interventions: Intervention Not Indicated  Readmission Risk Interventions    10/01/2020   12:53 PM  Readmission Risk Prevention Plan  Transportation Screening Complete  Medication Review ([place]) Complete  PCP or Specialist appointment within 3-5 days of discharge Complete  HRI or [place] Complete  SW Recovery Care/Counseling Consult Complete  Palliative Care Screening Complete  [place] Patient Refused

## 2022-04-30 NOTE — Progress Notes (Signed)
Heart Failure Stewardship Pharmacist Progress Note   [facility] PCP-Cardiologist: Jenkins Rouge, MD    HPI:  71 yo M with PMH of CHF, NSTEMI, HTN, DVT, seizures, T2DM, OSA, wheelchair bound due to severe spinal stenosis, and prostate cancer.   He was initially diagnosed with HF in 03/2020 when he presented to the hospital complaining of leg edema and weakness. ECHO at that time with EF <20% and RV mildly reduced. He was diuresed and left AMA on 04/13/20. He was discharged with lasix, carvedilol, and BiDil in the setting of AKI.   He presented back to the hospital on 04/21/20 with acute decompensated HF. He stated at this encounter that he had not picked up the medications due to financial reasons. He was diuresed and discharged on the same medications.   He was back at the hospital in 04/2020 with acute CHF and suspected seizure. He underwent stress test at that time. Significant perfusion defect of the entire anterior and inferior wall that is slightly worse at stress. However, does not appear to be severely abnormal, and distribution does not explain global hypokinesis. Likely infarct with small amount of worsening ischemia with stress, but cardiomyopathy out of proportion to ischemia/infarction. Diuresed and discharged with carvedilol, BiDil, and lasix PRN.  Hospitalized in 11/2021 with acute CHF. He ran out of home diuretics. ECHO from OSH in Feb 2023 with EF 40-45%. Diuresed and discharged with torsemide, carvedilol, and restarted BiDil.   Presented to the ED on 7/27 with shortness of breath and LEE. CXR without edema. Last ECHO on 01/26/22 with EF 35-40%, moderate LVH, RV normal.   Current HF Medications: Beta Blocker: carvedilol 12.5 mg BID Other: BiDil 20/37.5 mg 1 tab TID  Prior to admission HF Medications: Beta blocker: carvedilol 12.5 mg BID *unclear if he has been compliant with torsemide 20 mg BID  Pertinent Lab Values: Serum creatinine 2.80, BUN 66, Potassium 4.5,  Sodium 139, BNP 257.2, Magnesium 2.1, A1c 7.0  Vital Signs: Weight: 268 lbs (admission weight: 270 lbs) Blood pressure: 150/80s  Heart rate: 70s  I/O: -1.4L yesterday; net -6.3L  Medication Assistance / Insurance Benefits Check: Does the patient have prescription insurance?  Yes Type of insurance plan: [employer]  Outpatient Pharmacy:  Prior to admission outpatient pharmacy: [facility] Is the patient willing to use [place] pharmacy at discharge? Yes    Assessment: 1. Acute on chronic systolic CHF (LVEF 91-79%), due to NICM. NYHA class II symptoms. - Off IV lasix with improved volume status and worsening renal function. SCr further improved today. Strict I/O. Daily weights. Keep K>4 and Mag>2. Weight up another 1 lb today. Consider starting PO lasix today. - Continue carvedilol 12.5 mg BID - No ACE/ARB/ARNI, MRA, or SGLT2i with AKI on advanced CKD (eGFR <20) - Continue BiDil 20/37.5 mg 1 tab TID   Plan: 1) Medication changes recommended at this time: - Start lasix 40 mg PO daily  2) Patient assistance: - None needed - has [facility] benefits - Possible CIR at discharge  3)  Education  - To be completed prior to discharge  Kerby Nora, PharmD, BCPS Heart Failure Cytogeneticist Phone [phone]

## 2022-04-30 NOTE — Progress Notes (Signed)
Physical Therapy Treatment Patient Details Name: Tim Bradley MRN: [id number] DOB: Dec 20, 1950 Today's Date: 04/30/2022   History of Present Illness 71 y.o. male presents to [facility] hospital on 04/24/2022 with SOB. PMH:  chronic systolic heart failure, NSTEMI, hypertension, DVT , DM2, OSA, prostate ca, WC bound due to severe stenosis    PT Comments    Continues to work diligently with acute rehab. Weaker today, notably with increased WOB however SpO2 maintained 99-100% on 1.5L supplemental O2 throughout therapy session. Required Stedy equipment to stand from seated position. Worked on standing balance, tolerance, and LE strengthening with sit<>stand from [place] device. Mod assist for sit<>stand with Stedy from chair, Mod assist for LE support into bed. Patient will continue to benefit from skilled physical therapy services to further improve independence with functional mobility.    Recommendations for follow up therapy are one component of a multi-disciplinary discharge planning process, led by the attending physician.  Recommendations may be updated based on patient status, additional functional criteria and insurance authorization.  Follow Up Recommendations  Skilled nursing-short term rehab (<3 hours/day) (Did not qualify for AIR - rec SNF at this point for continued rehab.) Can patient physically be transported by private vehicle: No   Assistance Recommended at Discharge Frequent or constant Supervision/Assistance  Patient can return home with the following A lot of help with bathing/dressing/bathroom;Assistance with cooking/housework;Assist for transportation;Help with stairs or ramp for entrance;Two people to help with walking and/or transfers   Equipment Recommendations  None recommended by PT    Recommendations for Other Services       Precautions / Restrictions Precautions Precautions: Fall Precaution Comments: watch o2 Restrictions Weight Bearing Restrictions: No     Mobility   Bed Mobility Overal bed mobility: Needs Assistance Bed Mobility: Sit to Supine       Sit to supine: Mod assist   General bed mobility comments: Mod assist for LEs bil into bed. Able to lie down onto side and roll to back but needed support for LEs to come up simultaneously.    Transfers Overall transfer level: Needs assistance Equipment used: Ambulation equipment used Transfers: Sit to/from Stand Sit to Stand: Mod assist, +2 safety/equipment (with Stedy)           General transfer comment: Attempted stand from chair with RW however pt unable to complete with full assistance from therapist. Jeral Fruit and this allowed pt to pull with enough force through UEs to rise with mod assist for boost from therapist. Cues for technique throughout.    Ambulation/Gait               General Gait Details: unable at this time.   Stairs             Wheelchair Mobility    Modified Rankin (Stroke Patients Only)       Balance Overall balance assessment: Needs assistance Sitting-balance support: No upper extremity supported, Feet supported Sitting balance-Leahy Scale: Fair Sitting balance - Comments: poor core strength Postural control: Posterior lean Standing balance support: Bilateral upper extremity supported, Reliant on assistive device for balance, During functional activity Standing balance-Leahy Scale: Poor Standing balance comment: External assistance to maintain standing with stedy                            Cognition Arousal/Alertness: Awake/alert Behavior During Therapy: WFL for tasks assessed/performed Overall Cognitive Status: Within Functional Limits for tasks assessed  Exercises Other Exercises Other Exercises: Patient practiced sit<> transition from Stedy paddles x10. Other Exercises: Standing on Stedy with knees unblocked practiced weight shifting and progressed to small static  march (great difficulty with minimal lift attained on LLE.) Other Exercises: Tolerated multiple bouts of standing totalling approx 10 min combined.    General Comments General comments (skin integrity, edema, etc.): 100% SpO2 on 1.5L supplemental O2. However WOB increased throughout session. HOB elevated >30 deg end of session.      Pertinent Vitals/Pain Pain Assessment Pain Assessment: 0-10 Pain Score: 8  Pain Location: neck on Rt side Pain Descriptors / Indicators: Aching Pain Intervention(s): Limited activity within patient's tolerance, Monitored during session, Repositioned, Relaxation, Other (comment) (stretches)    Home Living                          Prior Function            PT Goals (current goals can now be found in the care plan section) Acute Rehab PT Goals PT Goal Formulation: With patient Time For Goal Achievement: 05/10/22 Potential to Achieve Goals: Fair Progress towards PT goals: Progressing toward goals    Frequency    Min 3X/week      PT Plan Current plan remains appropriate    Co-evaluation              AM-PAC PT "6 Clicks" Mobility   Outcome Measure  Help needed turning from your back to your side while in a flat bed without using bedrails?: A Lot Help needed moving from lying on your back to sitting on the side of a flat bed without using bedrails?: A Lot Help needed moving to and from a bed to a chair (including a wheelchair)?: A Lot Help needed standing up from a chair using your arms (e.g., wheelchair or bedside chair)?: A Lot Help needed to walk in hospital room?: A Lot Help needed climbing 3-5 steps with a railing? : Total 6 Click Score: 11    End of Session Equipment Utilized During Treatment: Gait belt Activity Tolerance: Patient tolerated treatment well Patient left: with call bell/phone within reach;in bed;with bed alarm set Nurse Communication: Mobility status;Need for lift equipment PT Visit Diagnosis:  Unsteadiness on feet (R26.81);Muscle weakness (generalized) (M62.81);Difficulty in walking, not elsewhere classified (R26.2)     Time: 7829-5621 PT Time Calculation (min) (ACUTE ONLY): 42 min  Charges:  $Therapeutic Exercise: 8-22 mins $Therapeutic Activity: 8-22 mins $Neuromuscular Re-education: 8-22 mins                     Candie Mile, PT    Ellouise Newer 04/30/2022, 1:54 PM

## 2022-05-01 LAB — BASIC METABOLIC PANEL
Anion gap: 9 (ref 5–15)
BUN: 67 mg/dL — ABNORMAL HIGH (ref 8–23)
CO2: 24 mmol/L (ref 22–32)
Calcium: 9.2 mg/dL (ref 8.9–10.3)
Chloride: 108 mmol/L (ref 98–111)
Creatinine, Ser: 2.92 mg/dL — ABNORMAL HIGH (ref 0.61–1.24)
GFR, Estimated: 22 mL/min — ABNORMAL LOW (ref >60)
Glucose, Bld: 136 mg/dL — ABNORMAL HIGH (ref 70–99)
Potassium: 4.7 mmol/L (ref 3.5–5.1)
Sodium: 141 mmol/L (ref 135–145)

## 2022-05-01 LAB — PROTEIN ELECTROPHORESIS, SERUM
A/G Ratio: 1.1 (ref 0.7–1.7)
Albumin ELP: 3.4 g/dL (ref 2.9–4.4)
Alpha-1-Globulin: 0.2 g/dL (ref 0.0–0.4)
Alpha-2-Globulin: 0.6 g/dL (ref 0.4–1.0)
Beta Globulin: 0.9 g/dL (ref 0.7–1.3)
Gamma Globulin: 1.3 g/dL (ref 0.4–1.8)
Globulin, Total: 3.1 g/dL (ref 2.2–3.9)
Total Protein ELP: 6.5 g/dL (ref 6.0–8.5)

## 2022-05-01 LAB — GLUCOSE, CAPILLARY
Glucose-Capillary: 160 mg/dL — ABNORMAL HIGH (ref 70–99)
Glucose-Capillary: 189 mg/dL — ABNORMAL HIGH (ref 70–99)

## 2022-05-01 MED ORDER — HYDROCODONE-ACETAMINOPHEN 5-325 MG PO TABS
1.0000 | ORAL_TABLET | Freq: Four times a day (QID) | ORAL | Status: DC | PRN
  Administered 2022-05-01: 1 via ORAL
  Filled 2022-05-01: qty 1

## 2022-05-01 MED ORDER — FUROSEMIDE 10 MG/ML IJ SOLN
60.0000 mg | Freq: Once | INTRAMUSCULAR | Status: AC
  Administered 2022-05-01: 60 mg via INTRAVENOUS
  Filled 2022-05-01: qty 6

## 2022-05-01 NOTE — Progress Notes (Signed)
Mobility Specialist Progress Note:   05/01/22 1210  Mobility  Activity Stood at bedside  Level of Assistance Moderate assist, patient does 50-74%  Assistive Device Stedy  Activity Response Tolerated well  $Mobility charge 1 Mobility   Pt received bed. Pt able to stand in stedy 5 times with ModA. Left in bed with call bell in reach and all needs met.   [facility] Denessa Cavan Mobility Specialist

## 2022-05-01 NOTE — NC FL2 (Signed)
[place] LEVEL OF CARE SCREENING TOOL     IDENTIFICATION  Patient Name: Tim Bradley Birthdate: 18-Jun-1951 Sex: male Admission Date (Current Location): 04/24/2022  [facility] [place] Number:  Herbalist and Address:  The [place] [place] [address] [place], [place] [address]      Provider Number: [id number]  Attending Physician Name and Address:  Domenic Polite, MD  Relative Name and Phone Number:  [name] (Sister)   [phone] [facility])    Current Level of Care: Hospital Recommended Level of Care: [place] Prior Approval Number:    Date Approved/Denied:   PASRR Number: [id number]  Discharge Plan: SNF    Current Diagnoses: Patient Active Problem List   Diagnosis Date Noted   History of prostate cancer 04/25/2022   Obesity (BMI 30-39.9) 04/25/2022   Recurrent seizures ([place]) 02/20/2022   E coli bacteremia 01/29/2022   Situational anxiety 01/26/2022   Physical deconditioning 01/26/2022   CKD (chronic kidney disease) stage 4, GFR 15-29 ml/min ([place]) 01/25/2022   Anemia of chronic disease 01/25/2022   Sepsis secondary to UTI ([place]) 01/24/2022   Acute cystitis 01/24/2022   Class 1 obesity 12/22/2021   Acute on chronic systolic heart failure ([place]) 12/21/2021   DNR (do not resuscitate) 12/21/2021   DVT (deep venous thrombosis) ([place]) 12/21/2021   Substance induced mood disorder ([place]) 10/27/2020   Right leg DVT ([place]) 09/30/2020   Seizure disorder ([place]) 85/10/7739   Chronic systolic CHF (congestive heart failure) ([place]) 09/28/2020   AKI (acute kidney injury) ([place]) 09/28/2020   Pressure injury of skin 05/03/2020   Acute encephalopathy 05/02/2020   Diarrhea 05/02/2020   Acute combined systolic and diastolic congestive heart failure ([place]) 04/22/2020   Essential hypertension 04/11/2020   Leg edema 04/11/2020   Dyspnea 04/11/2020   Acute on chronic congestive heart failure ([place]) 04/11/2020   BPH (benign  prostatic hyperplasia) 04/11/2020   Acute kidney injury superimposed on chronic kidney disease ([place]) 04/11/2020   Generalized weakness 04/11/2020   Seizure ([place]) 07/21/2019   Cocaine abuse ([place])    Constipation 08/22/2018   Gastritis 08/20/2018   Heme positive stool    Acute blood loss anemia secondary to massive gastric ulcer    Hyponatremia    Abscess, gluteal, left 03/26/2017   Type 2 diabetes mellitus with hyperlipidemia ([place]) 03/26/2017   Adrenal nodule ([place]) 03/26/2017   Closed fracture of body of thoracic vertebra ([place]) 03/26/2017   Spinal stenosis of lumbar region 03/26/2017    Orientation RESPIRATION BLADDER Height & Weight     Self, Time, Situation, Place  O2 (2L [place]) Incontinent, External catheter Weight: 268 lb 8.3 oz (121.8 kg) Height:  6\' 2"  (188 cm)  BEHAVIORAL SYMPTOMS/MOOD NEUROLOGICAL BOWEL NUTRITION STATUS      Incontinent Diet (See d/c summary)  AMBULATORY STATUS COMMUNICATION OF NEEDS Skin   Extensive Assist Verbally Normal                       Personal Care Assistance Level of Assistance  Bathing, Dressing, Feeding Bathing Assistance: Maximum assistance Feeding assistance: Independent Dressing Assistance: Limited assistance     Functional Limitations Info  Sight, Hearing, Speech Sight Info: Adequate Hearing Info: Adequate Speech Info: Adequate    SPECIAL CARE FACTORS FREQUENCY  PT (By licensed PT), OT (By licensed OT)     PT Frequency: 5x/week OT Frequency: 5x/week            Contractures Contractures Info: Not  present    Additional Factors Info  Code Status, Allergies Code Status Info: Full code Allergies Info: Trazodone And Nefazodone, gabapentin, tramadol           Current Medications (05/01/2022):  This is the current hospital active medication list Current Facility-Administered Medications  Medication Dose Route Frequency Provider Last Rate Last Admin   acetaminophen (TYLENOL) tablet 650 mg  650 mg Oral Q6H PRN Opyd,  Ilene Qua, MD   650 mg at 05/01/22 6294   ALPRAZolam Duanne Moron) tablet 0.5 mg  0.5 mg Oral TID PRN Tawni Millers, MD   0.5 mg at 05/01/22 7654   apixaban (ELIQUIS) tablet 5 mg  5 mg Oral BID Tu, Ching T, DO   5 mg at 05/01/22 6503   atorvastatin (LIPITOR) tablet 80 mg  80 mg Oral Daily Tawni Millers, MD   80 mg at 05/01/22 5465   capsaicin (ZOSTRIX) 0.025 % cream   Topical BID Tawni Millers, MD   Given at 05/01/22 0908   carvedilol (COREG) tablet 12.5 mg  12.5 mg Oral BID WC Arrien, Jimmy Picket, MD   12.5 mg at 05/01/22 6812   cholecalciferol (VITAMIN D3) 25 MCG (1000 UNIT) tablet 2,000 Units  2,000 Units Oral Daily Tawni Millers, MD   2,000 Units at 05/01/22 7517   finasteride (PROSCAR) tablet 5 mg  5 mg Oral Daily Arrien, Jimmy Picket, MD   5 mg at 05/01/22 0017   furosemide (LASIX) tablet 60 mg  60 mg Oral Daily Domenic Polite, MD   60 mg at 05/01/22 0907   HYDROcodone-acetaminophen (NORCO/VICODIN) 5-325 MG per tablet 1 tablet  1 tablet Oral Q6H PRN Domenic Polite, MD   1 tablet at 05/01/22 4944   insulin aspart (novoLOG) injection 0-15 Units  0-15 Units Subcutaneous TID WC Arrien, Jimmy Picket, MD   3 Units at 05/01/22 1203   isosorbide-hydrALAZINE (BIDIL) 20-37.5 MG per tablet 1 tablet  1 tablet Oral TID Tawni Millers, MD   1 tablet at 05/01/22 0907   levETIRAcetam (KEPPRA) tablet 750 mg  750 mg Oral BID Tu, Ching T, DO   750 mg at 05/01/22 0908   lidocaine (LIDODERM) 5 % 1 patch  1 patch Transdermal Q24H Rise Patience, MD   1 patch at 04/30/22 2141   magnesium oxide (MAG-OX) tablet 400 mg  400 mg Oral Daily Tawni Millers, MD   400 mg at 05/01/22 9675   pneumococcal 20-valent conjugate vaccine (PREVNAR 20) injection 0.5 mL  0.5 mL Intramuscular Tomorrow-1000 Arrien, Jimmy Picket, MD       polyvinyl alcohol (LIQUIFILM TEARS) 1.4 % ophthalmic solution 2 drop  2 drop Both Eyes PRN Arrien, Jimmy Picket, MD          Discharge Medications: Please see discharge summary for a list of discharge medications.  Relevant Imaging Results:  Relevant Lab Results:   Additional Information [phone]  Bethann Berkshire, LCSW

## 2022-05-01 NOTE — Progress Notes (Signed)
Pt seen and examined, no changes from DC summary as noted by Dr.Arrien yesterday,  -He will discharge to SNF today 05/01/22 -71/M chronically ill and debilitated admitted with Acute diastolic CHF in the background of CKD4. Diuresed well, is 8.2L negative, changed to Po lasix yesterday, GDMT limited by CKD4 -needs close Follow up with Cardiology and Nephrology at the [facility]  Domenic Polite, MD

## 2022-05-01 NOTE — TOC Transition Note (Signed)
Transition of Care [facility]) - CM/SW Discharge Note   Patient Details  Name: Tim Bradley MRN: [id number] Date of Birth: [birth year]  [name] CM/SW Contact:  Tim Bradley, [place]is Phone Number: 05/01/2022, 2:50 PM   Clinical Narrative:     Patient will DC to: [name] Anticipated DC date: 05/01/22 Family notified: Tim Bradley (Sister)  [phone] [name]) Transport by: Tim Bradley   Per MD patient ready for DC to [name] SNF. RN, patient, patient's family, and facility notified of DC. Discharge Summary and FL2 sent to facility. RN to call report prior to discharge [phone] Room 101). DC packet on chart. Ambulance transport requested for patient.   CSW will sign off for now as social work intervention is no longer needed. Please consult [place] again if new needs arise.   Final next level of care: Skilled Nursing Facility Barriers to Discharge: No Barriers Identified   Patient Goals and CMS Choice        Discharge Placement              Patient chooses bed at: [name] Patient to be transferred to facility by: [name] Name of family member notified: Tim Bradley (Sister)   [phone] [name]) Patient and family notified of of transfer: 05/01/22  Discharge Plan and Services                                     Social Determinants of Health (SDOH) Interventions Food Insecurity Interventions: Intervention Not Indicated Housing Interventions: Intervention Not Indicated Transportation Interventions: Intervention Not Indicated   Readmission Risk Interventions    10/01/2020   12:53 PM  Readmission Risk Prevention Plan  Transportation Screening Complete  Medication Review ([name]) Complete  PCP or Specialist appointment within 3-5 days of discharge Complete  HRI or [place] Complete  SW Recovery Care/Counseling Consult Complete  Palliative Care Screening Complete  [name] Patient Refused

## 2022-05-01 NOTE — Progress Notes (Signed)
Retrieved patient's money from security. Patient accepted the amount of $4449 back. Patient signed sheet.

## 2022-05-01 NOTE — Progress Notes (Signed)
Heart Failure Stewardship Pharmacist Progress Note   [facility] PCP-Cardiologist: Jenkins Rouge, MD    HPI:  71 yo M with PMH of CHF, NSTEMI, HTN, DVT, seizures, T2DM, OSA, wheelchair bound due to severe spinal stenosis, and prostate cancer.   He was initially diagnosed with HF in 03/2020 when he presented to the hospital complaining of leg edema and weakness. ECHO at that time with EF <20% and RV mildly reduced. He was diuresed and left AMA on 04/13/20. He was discharged with lasix, carvedilol, and BiDil in the setting of AKI.   He presented back to the hospital on 04/21/20 with acute decompensated HF. He stated at this encounter that he had not picked up the medications due to financial reasons. He was diuresed and discharged on the same medications.   He was back at the hospital in 04/2020 with acute CHF and suspected seizure. He underwent stress test at that time. Significant perfusion defect of the entire anterior and inferior wall that is slightly worse at stress. However, does not appear to be severely abnormal, and distribution does not explain global hypokinesis. Likely infarct with small amount of worsening ischemia with stress, but cardiomyopathy out of proportion to ischemia/infarction. Diuresed and discharged with carvedilol, BiDil, and lasix PRN.  Hospitalized in 11/2021 with acute CHF. He ran out of home diuretics. ECHO from OSH in Feb 2023 with EF 40-45%. Diuresed and discharged with torsemide, carvedilol, and restarted BiDil.   Presented to the ED on 7/27 with shortness of breath and LEE. CXR without edema. Last ECHO on 01/26/22 with EF 35-40%, moderate LVH, RV normal.   Discharge HF Medications: Diuretic: furosemide 60 mg daily Beta Blocker: carvedilol 12.5 mg BID Other: BiDil 20/37.5 mg 1 tab TID  Prior to admission HF Medications: Beta blocker: carvedilol 12.5 mg BID *unclear if he has been compliant with torsemide 20 mg BID  Pertinent Lab Values: Serum  creatinine 2.92, BUN 67, Potassium 4.7, Sodium 141, BNP 257.2, Magnesium 2.1, A1c 7.0  Vital Signs: Weight: 268 lbs (admission weight: 270 lbs) Blood pressure: 140-160/80s  Heart rate: 70s  I/O: -2.1L yesterday; net -8L  Medication Assistance / Insurance Benefits Check: Does the patient have prescription insurance?  Yes Type of insurance plan: [place]  Outpatient Pharmacy:  Prior to admission outpatient pharmacy: [facility] Is the patient willing to use [place] pharmacy at discharge? Yes    Assessment: 1. Acute on chronic systolic CHF (LVEF 29-93%), due to NICM. NYHA class II symptoms. - IV lasix x 1 today then possible discharge to SNF. Strict I/O. Daily weights. Keep K>4 and Mag>2. Furosemide 60 mg daily at discharge. - Continue carvedilol 12.5 mg BID - No ACE/ARB/ARNI, MRA, or SGLT2i with AKI on advanced CKD (eGFR <20) - Continue BiDil 20/37.5 mg 1 tab TID   Plan: 1) Medication changes recommended at this time: - Agree with discharge medication plan  2) Patient assistance: - None needed - has VA benefits - SNF at discharge  Kerby Nora, PharmD, BCPS Heart Failure Stewardship Pharmacist Phone [phone]

## 2022-05-11 ENCOUNTER — Emergency Department (HOSPITAL_COMMUNITY): Payer: No Typology Code available for payment source

## 2022-05-11 ENCOUNTER — Inpatient Hospital Stay (HOSPITAL_COMMUNITY)
Admission: EM | Admit: 2022-05-11 | Discharge: 2022-05-15 | DRG: 291 | Disposition: A | Discharge status: Skilled Nursing Facility | Payer: No Typology Code available for payment source | Source: Skilled Nursing Facility | Attending: Internal Medicine | Admitting: Internal Medicine

## 2022-05-11 ENCOUNTER — Encounter (HOSPITAL_COMMUNITY): Payer: Self-pay

## 2022-05-11 DIAGNOSIS — Z7901 Long term (current) use of anticoagulants: Secondary | ICD-10-CM

## 2022-05-11 DIAGNOSIS — I509 Heart failure, unspecified: Principal | ICD-10-CM

## 2022-05-11 DIAGNOSIS — I82509 Chronic embolism and thrombosis of unspecified deep veins of unspecified lower extremity: Secondary | ICD-10-CM | POA: Diagnosis present

## 2022-05-11 DIAGNOSIS — Z993 Dependence on wheelchair: Secondary | ICD-10-CM

## 2022-05-11 DIAGNOSIS — I5033 Acute on chronic diastolic (congestive) heart failure: Secondary | ICD-10-CM

## 2022-05-11 DIAGNOSIS — Z6835 Body mass index (BMI) 35.0-35.9, adult: Secondary | ICD-10-CM

## 2022-05-11 DIAGNOSIS — Z8249 Family history of ischemic heart disease and other diseases of the circulatory system: Secondary | ICD-10-CM

## 2022-05-11 DIAGNOSIS — J449 Chronic obstructive pulmonary disease, unspecified: Secondary | ICD-10-CM | POA: Diagnosis present

## 2022-05-11 DIAGNOSIS — K219 Gastro-esophageal reflux disease without esophagitis: Secondary | ICD-10-CM | POA: Diagnosis present

## 2022-05-11 DIAGNOSIS — G479 Sleep disorder, unspecified: Secondary | ICD-10-CM | POA: Diagnosis not present

## 2022-05-11 DIAGNOSIS — N189 Chronic kidney disease, unspecified: Secondary | ICD-10-CM

## 2022-05-11 DIAGNOSIS — I447 Left bundle-branch block, unspecified: Secondary | ICD-10-CM | POA: Diagnosis present

## 2022-05-11 DIAGNOSIS — Z888 Allergy status to other drugs, medicaments and biological substances status: Secondary | ICD-10-CM

## 2022-05-11 DIAGNOSIS — E1122 Type 2 diabetes mellitus with diabetic chronic kidney disease: Secondary | ICD-10-CM | POA: Diagnosis present

## 2022-05-11 DIAGNOSIS — I130 Hypertensive heart and chronic kidney disease with heart failure and stage 1 through stage 4 chronic kidney disease, or unspecified chronic kidney disease: Principal | ICD-10-CM | POA: Diagnosis present

## 2022-05-11 DIAGNOSIS — E785 Hyperlipidemia, unspecified: Secondary | ICD-10-CM | POA: Diagnosis present

## 2022-05-11 DIAGNOSIS — E119 Type 2 diabetes mellitus without complications: Secondary | ICD-10-CM

## 2022-05-11 DIAGNOSIS — I5023 Acute on chronic systolic (congestive) heart failure: Secondary | ICD-10-CM | POA: Diagnosis present

## 2022-05-11 DIAGNOSIS — I10 Essential (primary) hypertension: Secondary | ICD-10-CM | POA: Diagnosis present

## 2022-05-11 DIAGNOSIS — R569 Unspecified convulsions: Secondary | ICD-10-CM

## 2022-05-11 DIAGNOSIS — J9601 Acute respiratory failure with hypoxia: Secondary | ICD-10-CM | POA: Diagnosis present

## 2022-05-11 DIAGNOSIS — Z885 Allergy status to narcotic agent status: Secondary | ICD-10-CM

## 2022-05-11 DIAGNOSIS — I429 Cardiomyopathy, unspecified: Secondary | ICD-10-CM | POA: Diagnosis present

## 2022-05-11 DIAGNOSIS — F419 Anxiety disorder, unspecified: Secondary | ICD-10-CM | POA: Diagnosis present

## 2022-05-11 DIAGNOSIS — N179 Acute kidney failure, unspecified: Secondary | ICD-10-CM | POA: Diagnosis present

## 2022-05-11 DIAGNOSIS — R0602 Shortness of breath: Secondary | ICD-10-CM | POA: Diagnosis not present

## 2022-05-11 DIAGNOSIS — Z91148 Patient's other noncompliance with medication regimen for other reason: Secondary | ICD-10-CM

## 2022-05-11 DIAGNOSIS — N400 Enlarged prostate without lower urinary tract symptoms: Secondary | ICD-10-CM | POA: Diagnosis present

## 2022-05-11 DIAGNOSIS — E669 Obesity, unspecified: Secondary | ICD-10-CM | POA: Diagnosis present

## 2022-05-11 DIAGNOSIS — J441 Chronic obstructive pulmonary disease with (acute) exacerbation: Secondary | ICD-10-CM | POA: Diagnosis present

## 2022-05-11 DIAGNOSIS — R5381 Other malaise: Secondary | ICD-10-CM | POA: Diagnosis present

## 2022-05-11 DIAGNOSIS — Z87891 Personal history of nicotine dependence: Secondary | ICD-10-CM

## 2022-05-11 DIAGNOSIS — Z66 Do not resuscitate: Secondary | ICD-10-CM | POA: Diagnosis present

## 2022-05-11 DIAGNOSIS — Z79899 Other long term (current) drug therapy: Secondary | ICD-10-CM

## 2022-05-11 DIAGNOSIS — I5043 Acute on chronic combined systolic (congestive) and diastolic (congestive) heart failure: Secondary | ICD-10-CM | POA: Diagnosis present

## 2022-05-11 DIAGNOSIS — Z20822 Contact with and (suspected) exposure to covid-19: Secondary | ICD-10-CM | POA: Diagnosis present

## 2022-05-11 DIAGNOSIS — Z7401 Bed confinement status: Secondary | ICD-10-CM

## 2022-05-11 DIAGNOSIS — Z8546 Personal history of malignant neoplasm of prostate: Secondary | ICD-10-CM

## 2022-05-11 DIAGNOSIS — I82501 Chronic embolism and thrombosis of unspecified deep veins of right lower extremity: Secondary | ICD-10-CM | POA: Diagnosis present

## 2022-05-11 DIAGNOSIS — N184 Chronic kidney disease, stage 4 (severe): Secondary | ICD-10-CM | POA: Diagnosis present

## 2022-05-11 LAB — CBC WITH DIFFERENTIAL/PLATELET
Abs Immature Granulocytes: 0.02 10*3/uL (ref 0.00–0.07)
Basophils Absolute: 0 10*3/uL (ref 0.0–0.1)
Basophils Relative: 1 %
Eosinophils Absolute: 0.2 10*3/uL (ref 0.0–0.5)
Eosinophils Relative: 2 %
HCT: 33.3 % — ABNORMAL LOW (ref 39.0–52.0)
Hemoglobin: 10.3 g/dL — ABNORMAL LOW (ref 13.0–17.0)
Immature Granulocytes: 0 %
Lymphocytes Relative: 21 %
Lymphs Abs: 1.4 10*3/uL (ref 0.7–4.0)
MCH: 27.8 pg (ref 26.0–34.0)
MCHC: 30.9 g/dL (ref 30.0–36.0)
MCV: 89.8 fL (ref 80.0–100.0)
Monocytes Absolute: 0.9 10*3/uL (ref 0.1–1.0)
Monocytes Relative: 13 %
Neutro Abs: 4.3 10*3/uL (ref 1.7–7.7)
Neutrophils Relative %: 63 %
Platelets: 221 10*3/uL (ref 150–400)
RBC: 3.71 MIL/uL — ABNORMAL LOW (ref 4.22–5.81)
RDW: 14.6 % (ref 11.5–15.5)
WBC: 6.8 10*3/uL (ref 4.0–10.5)
nRBC: 0 % (ref 0.0–0.2)

## 2022-05-11 LAB — COMPREHENSIVE METABOLIC PANEL
ALT: 15 U/L (ref 0–44)
AST: 14 U/L — ABNORMAL LOW (ref 15–41)
Albumin: 3.5 g/dL (ref 3.5–5.0)
Alkaline Phosphatase: 62 U/L (ref 38–126)
Anion gap: 5 (ref 5–15)
BUN: 48 mg/dL — ABNORMAL HIGH (ref 8–23)
CO2: 24 mmol/L (ref 22–32)
Calcium: 9.1 mg/dL (ref 8.9–10.3)
Chloride: 107 mmol/L (ref 98–111)
Creatinine, Ser: 2.6 mg/dL — ABNORMAL HIGH (ref 0.61–1.24)
GFR, Estimated: 25 mL/min — ABNORMAL LOW (ref >60)
Glucose, Bld: 103 mg/dL — ABNORMAL HIGH (ref 70–99)
Potassium: 5 mmol/L (ref 3.5–5.1)
Sodium: 136 mmol/L (ref 135–145)
Total Bilirubin: 0.4 mg/dL (ref 0.3–1.2)
Total Protein: 6.9 g/dL (ref 6.5–8.1)

## 2022-05-11 LAB — BRAIN NATRIURETIC PEPTIDE: B Natriuretic Peptide: 480.5 pg/mL — ABNORMAL HIGH (ref 0.0–100.0)

## 2022-05-11 LAB — RESP PANEL BY RT-PCR (FLU A&B, COVID) ARPGX2
Influenza A by PCR: NEGATIVE
Influenza B by PCR: NEGATIVE
SARS Coronavirus 2 by RT PCR: NEGATIVE

## 2022-05-11 LAB — MAGNESIUM: Magnesium: 2.4 mg/dL (ref 1.7–2.4)

## 2022-05-11 MED ORDER — ATORVASTATIN CALCIUM 40 MG PO TABS: 80.0000 mg | ORAL_TABLET | Freq: Every day | ORAL | Status: DC

## 2022-05-11 MED ORDER — ALBUTEROL SULFATE (2.5 MG/3ML) 0.083% IN NEBU: 2.5000 mg | INHALATION_SOLUTION | Freq: Four times a day (QID) | RESPIRATORY_TRACT | Status: DC | PRN

## 2022-05-11 MED ORDER — MAGNESIUM OXIDE -MG SUPPLEMENT 400 (240 MG) MG PO TABS
400.0000 mg | ORAL_TABLET | Freq: Every day | ORAL | Status: DC
  Administered 2022-05-12 – 2022-05-15 (×4): 400 mg via ORAL
  Filled 2022-05-11 (×4): qty 1

## 2022-05-11 MED ORDER — LACOSAMIDE 50 MG PO TABS
100.0000 mg | ORAL_TABLET | Freq: Two times a day (BID) | ORAL | Status: DC
  Administered 2022-05-11 – 2022-05-15 (×8): 100 mg via ORAL
  Filled 2022-05-11 (×8): qty 2

## 2022-05-11 MED ORDER — ACETAMINOPHEN 325 MG PO TABS
650.0000 mg | ORAL_TABLET | Freq: Four times a day (QID) | ORAL | Status: DC | PRN
  Administered 2022-05-11 – 2022-05-13 (×5): 650 mg via ORAL
  Filled 2022-05-11 (×4): qty 2

## 2022-05-11 MED ORDER — ALPRAZOLAM 0.25 MG PO TABS
0.2500 mg | ORAL_TABLET | Freq: Three times a day (TID) | ORAL | Status: DC | PRN
  Administered 2022-05-11 – 2022-05-15 (×8): 0.25 mg via ORAL
  Filled 2022-05-11 (×8): qty 1

## 2022-05-11 MED ORDER — MAGNESIUM OXIDE 420 MG PO TABS
420.0000 mg | ORAL_TABLET | Freq: Every day | ORAL | Status: DC
Start: 2022-05-11 — End: 2022-05-11

## 2022-05-11 MED ORDER — APIXABAN 5 MG PO TABS
5.0000 mg | ORAL_TABLET | Freq: Two times a day (BID) | ORAL | Status: DC
  Administered 2022-05-11 – 2022-05-15 (×8): 5 mg via ORAL
  Filled 2022-05-11 (×8): qty 1

## 2022-05-11 MED ORDER — MOMETASONE FURO-FORMOTEROL FUM 200-5 MCG/ACT IN AERO
2.0000 | INHALATION_SPRAY | Freq: Two times a day (BID) | RESPIRATORY_TRACT | Status: DC
Start: 2022-05-11 — End: 2022-05-15
  Administered 2022-05-12 – 2022-05-15 (×7): 2 via RESPIRATORY_TRACT
  Filled 2022-05-11: qty 8.8

## 2022-05-11 MED ORDER — ISOSORB DINITRATE-HYDRALAZINE 20-37.5 MG PO TABS
1.0000 | ORAL_TABLET | Freq: Three times a day (TID) | ORAL | Status: DC
  Administered 2022-05-11 – 2022-05-13 (×5): 1 via ORAL
  Filled 2022-05-11 (×7): qty 1

## 2022-05-11 MED ORDER — ALPRAZOLAM 0.25 MG PO TABS
0.2500 mg | ORAL_TABLET | Freq: Once | ORAL | Status: AC
  Administered 2022-05-11: 0.25 mg via ORAL
  Filled 2022-05-11: qty 1

## 2022-05-11 MED ORDER — FUROSEMIDE 10 MG/ML IJ SOLN
40.0000 mg | Freq: Two times a day (BID) | INTRAMUSCULAR | Status: DC
  Administered 2022-05-12: 40 mg via INTRAVENOUS
  Filled 2022-05-11: qty 4

## 2022-05-11 MED ORDER — ACETAMINOPHEN 325 MG PO TABS
650.0000 mg | ORAL_TABLET | ORAL | Status: DC | PRN
  Administered 2022-05-13 – 2022-05-15 (×3): 650 mg via ORAL
  Filled 2022-05-11 (×4): qty 2

## 2022-05-11 MED ORDER — FUROSEMIDE 10 MG/ML IJ SOLN
40.0000 mg | Freq: Once | INTRAMUSCULAR | Status: AC
  Administered 2022-05-11: 40 mg via INTRAVENOUS
  Filled 2022-05-11: qty 4

## 2022-05-11 MED ORDER — SODIUM CHLORIDE 0.9 % IV SOLN: 250.0000 mL | INTRAVENOUS | Status: DC | PRN

## 2022-05-11 MED ORDER — FINASTERIDE 5 MG PO TABS
5.0000 mg | ORAL_TABLET | Freq: Every day | ORAL | Status: DC
  Administered 2022-05-12 – 2022-05-15 (×4): 5 mg via ORAL
  Filled 2022-05-11 (×4): qty 1

## 2022-05-11 MED ORDER — ALBUTEROL SULFATE HFA 108 (90 BASE) MCG/ACT IN AERS: 2.0000 | INHALATION_SPRAY | Freq: Four times a day (QID) | RESPIRATORY_TRACT | Status: DC | PRN

## 2022-05-11 MED ORDER — CARVEDILOL 12.5 MG PO TABS
12.5000 mg | ORAL_TABLET | Freq: Two times a day (BID) | ORAL | Status: DC
  Administered 2022-05-11 – 2022-05-15 (×8): 12.5 mg via ORAL
  Filled 2022-05-11 (×8): qty 1

## 2022-05-11 MED ORDER — SODIUM CHLORIDE 0.9% FLUSH
3.0000 mL | Freq: Two times a day (BID) | INTRAVENOUS | Status: DC
  Administered 2022-05-11 – 2022-05-15 (×8): 3 mL via INTRAVENOUS

## 2022-05-11 MED ORDER — LACOSAMIDE 100 MG PO TABS
100.0000 mg | ORAL_TABLET | Freq: Two times a day (BID) | ORAL | Status: DC
Start: 2022-05-11 — End: 2022-05-11

## 2022-05-11 MED ORDER — CAPSAICIN 0.025 % EX CREA: TOPICAL_CREAM | Freq: Two times a day (BID) | CUTANEOUS | Status: DC

## 2022-05-11 MED ORDER — LEVETIRACETAM 750 MG PO TABS
750.0000 mg | ORAL_TABLET | Freq: Two times a day (BID) | ORAL | Status: DC
  Administered 2022-05-11 – 2022-05-15 (×8): 750 mg via ORAL
  Filled 2022-05-11 (×9): qty 1

## 2022-05-11 MED ORDER — IPRATROPIUM-ALBUTEROL 0.5-2.5 (3) MG/3ML IN SOLN
3.0000 mL | Freq: Four times a day (QID) | RESPIRATORY_TRACT | Status: DC
  Filled 2022-05-11 (×2): qty 3

## 2022-05-11 MED ORDER — ONDANSETRON HCL 4 MG/2ML IJ SOLN: 4.0000 mg | Freq: Four times a day (QID) | INTRAMUSCULAR | Status: DC | PRN

## 2022-05-11 MED ORDER — IPRATROPIUM-ALBUTEROL 0.5-2.5 (3) MG/3ML IN SOLN
3.0000 mL | Freq: Three times a day (TID) | RESPIRATORY_TRACT | Status: DC
  Administered 2022-05-12 (×3): 3 mL via RESPIRATORY_TRACT
  Filled 2022-05-11 (×3): qty 3

## 2022-05-11 MED ORDER — SODIUM CHLORIDE 0.9% FLUSH: 3.0000 mL | INTRAVENOUS | Status: DC | PRN

## 2022-05-11 NOTE — ED Notes (Signed)
Pt is asking for xanax at this time. Will notify MD. Reports increasing anxiety at this time.

## 2022-05-11 NOTE — ED Triage Notes (Signed)
Pt was discharged on 8/3 for edema. Hx of CHF and on lasix. Here for worsening generalized swelling and sob.

## 2022-05-11 NOTE — ED Provider Notes (Signed)
[facility] EMERGENCY DEPARTMENT Provider Note   CSN: [id number] Arrival date & time: 05/11/22  1205     History  Chief Complaint  Patient presents with   Edema    Tim Bradley is a 71 y.o. male.  71 year old male with a history of CHF (35%) on home 2L oxygen, prostate cancer, diabetes, CKD, DVT on eliquis, anemia, who presents to the emergency department with 4 days bilateral lower extremity swelling and shortness of breath.  Patient states that over the past 4 days he has noticed increased swelling of his lower extremities.  He is currently a resident at [facility] who noted that his urine output had decreased over the past several days and that his oxygen saturations were in the 80s today on his home 2 L.  Patient states that he also has had a runny nose and sore throat recently.  Says that he is feeling anxious and was previously on an anxiety pill.  Denies any chest pain or cough recently.        Home Medications Prior to Admission medications   Medication Sig Start Date End Date Taking? Authorizing Provider  acetaminophen (TYLENOL) 500 MG tablet Take 1,000 mg by mouth 3 (three) times daily as needed for mild pain.   Yes [provider]  apixaban (ELIQUIS) 5 MG TABS tablet Take 1 tablet (5 mg total) by mouth 2 (two) times daily. 01/10/22  Yes Horton, Alvin Critchley, DO  bisacodyl (DULCOLAX) 5 MG EC tablet Take 10 mg by mouth daily as needed for moderate constipation. 05/10/22 05/13/22 Yes [provider]  carvedilol (COREG) 12.5 MG tablet Take 1 tablet (12.5 mg total) by mouth 2 (two) times daily. 04/30/22 05/30/22 Yes Arrien, Jimmy Picket, MD  Cholecalciferol (QC VITAMIN D3) 50 MCG (2000 UT) TABS Take 2,000 Units by mouth daily.   Yes [provider]  furosemide (LASIX) 40 MG tablet Take 40 mg by mouth daily.   Yes [provider]  guaiFENesin (ROBITUSSIN) 100 MG/5ML liquid Take 10 mLs by mouth every 4 (four) hours as  needed for cough or to loosen phlegm. Give 33mL (equal to 200mg ) by mouth every four hours as needed for cough x 72 hours. **up to 2400mg  24 hours** 05/11/22  Yes [provider]  HYDROcodone-acetaminophen (NORCO/VICODIN) 5-325 MG tablet Take 1 tablet by mouth 2 (two) times daily. 1 tablet by mouth twice every day as needed x 14 days for breakthrough pain. 05/02/22  Yes [provider]  ipratropium-albuterol (DUONEB) 0.5-2.5 (3) MG/3ML SOLN Take 3 mLs by nebulization every 4 (four) hours as needed (shortness of breath/wheezing).   Yes [provider]  Lacosamide 100 MG TABS Take 100 mg by mouth 2 (two) times daily.   Yes [provider]  levETIRAcetam (KEPPRA) 750 MG tablet Take 1 tablet (750 mg total) by mouth 2 (two) times daily. 02/21/22  Yes Ghimire, Henreitta Leber, MD  Magnesium Oxide 420 MG TABS Take 420 mg by mouth daily.   Yes [provider]  mometasone-formoterol (DULERA) 200-5 MCG/ACT AERO Inhale 2 puffs into the lungs 2 (two) times daily. 01/31/22 05/11/22 Yes Pahwani, Einar Grad, MD  naloxone [facility]) nasal spray 4 mg/0.1 mL Place 1 spray into the nose as needed (opioid overdose).   Yes [provider]  tizanidine (ZANAFLEX) 2 MG capsule Take 2 mg by mouth 2 (two) times daily as needed for muscle spasms. 05/09/22 05/14/22 Yes [provider]  acetaminophen (TYLENOL) 325 MG tablet Take 2 tablets (  650 mg total) by mouth every 6 (six) hours as needed for mild pain or headache. Patient not taking: Reported on 05/11/2022 04/30/22   Arrien, Jimmy Picket, MD  atorvastatin (LIPITOR) 80 MG tablet Take 1 tablet (80 mg total) by mouth daily. Patient not taking: Reported on 05/11/2022 01/10/22   Horton, Alvin Critchley, DO  capsaicin (ZOSTRIX) 0.025 % cream Apply topically 2 (two) times daily. Patient not taking: Reported on 05/11/2022 04/30/22   Arrien, Jimmy Picket, MD  furosemide (LASIX) 20 MG tablet Take 3 tablets (60 mg total) by mouth daily. Patient not  taking: Reported on 05/11/2022 04/30/22 05/30/22  Arrien, Jimmy Picket, MD  isosorbide-hydrALAZINE (BIDIL) 20-37.5 MG tablet Take 1 tablet by mouth 3 (three) times daily. Patient not taking: Reported on 05/11/2022 04/30/22 05/30/22  Arrien, Jimmy Picket, MD      Allergies    Gabapentin, Tramadol, and Trazodone and nefazodone    Review of Systems   Review of Systems  Physical Exam Updated Vital Signs BP (!) 140/82   Pulse 74   Temp 97.6 F (36.4 C) (Oral)   Resp 16   Wt 124.7 kg   SpO2 98%   BMI 35.30 kg/m  Physical Exam Vitals and nursing note reviewed.  Constitutional:      General: He is not in acute distress.    Appearance: He is well-developed.     Comments: Speaking in full sentences.  Not in any acute respiratory distress.  HENT:     Head: Normocephalic and atraumatic.     Right Ear: External ear normal.     Left Ear: External ear normal.     Nose: Nose normal.  Eyes:     Extraocular Movements: Extraocular movements intact.     Conjunctiva/sclera: Conjunctivae normal.     Pupils: Pupils are equal, round, and reactive to light.  Cardiovascular:     Rate and Rhythm: Normal rate and regular rhythm.     Heart sounds: Normal heart sounds.  Pulmonary:     Effort: Pulmonary effort is normal. No respiratory distress.     Breath sounds: Rales (Bibasilar) present.  Abdominal:     General: There is no distension.     Palpations: Abdomen is soft. There is no mass.     Tenderness: There is no abdominal tenderness. There is no guarding.  Musculoskeletal:        General: No swelling.     Cervical back: Normal range of motion and neck supple.     Right lower leg: Edema (3+) present.     Left lower leg: Edema (3+) present.  Skin:    General: Skin is warm and dry.     Capillary Refill: Capillary refill takes less than 2 seconds.  Neurological:     Mental Status: He is alert. Mental status is at baseline.  Psychiatric:        Mood and Affect: Mood normal.        Behavior:  Behavior normal.     ED Results / Procedures / Treatments   Labs (all labs ordered are listed, but only abnormal results are displayed) Labs Reviewed  COMPREHENSIVE METABOLIC PANEL - Abnormal; Notable for the following components:      Result Value   Glucose, Bld 103 (*)    BUN 48 (*)    Creatinine, Ser 2.60 (*)    AST 14 (*)    GFR, Estimated 25 (*)    All other components within normal limits  CBC WITH DIFFERENTIAL/PLATELET - Abnormal; Notable  for the following components:   RBC 3.71 (*)    Hemoglobin 10.3 (*)    HCT 33.3 (*)    All other components within normal limits  BRAIN NATRIURETIC PEPTIDE - Abnormal; Notable for the following components:   B Natriuretic Peptide 480.5 (*)    All other components within normal limits  BASIC METABOLIC PANEL - Abnormal; Notable for the following components:   Glucose, Bld 139 (*)    BUN 53 (*)    Creatinine, Ser 3.03 (*)    GFR, Estimated 21 (*)    All other components within normal limits  RESP PANEL BY RT-PCR (FLU A&B, COVID) [id number]  MAGNESIUM    EKG EKG Interpretation  Date/Time:  Sunday May 11 2022 12:19:09 EDT Ventricular Rate:  63 PR Interval:  175 QRS Duration: 175 QT Interval:  473 QTC Calculation: 485 R Axis:   -54 Text Interpretation: Sinus rhythm Probable left atrial enlargement Left bundle branch block Confirmed by Margaretmary Eddy [phone]) on 05/11/2022 2:11:24 PM  Radiology DG Chest 1 View  Result Date: 05/12/2022 CLINICAL DATA:  CHF. EXAM: CHEST  1 VIEW COMPARISON:  Chest x-rays dated 05/11/2022 and 04/24/2022. FINDINGS: Stable cardiomegaly. Mild central pulmonary vascular congestion and interstitial prominence. No pleural effusion or pneumothorax is seen. Osseous structures about the chest are unremarkable. IMPRESSION: Cardiomegaly with mild central pulmonary vascular congestion and interstitial prominence suggesting mild CHF/volume overload. Electronically Signed   By: Franki Cabot M.D.   On: 05/12/2022 07:43    DG Chest Port 1 View  Result Date: 05/11/2022 CLINICAL DATA:  Shortness of breath EXAM: PORTABLE CHEST 1 VIEW COMPARISON:  April 24, 2022 FINDINGS: Stable cardiomegaly. The hila, mediastinum, and pleura are unchanged. Haziness over the bases is likely due to overlapping soft tissues and portable technique. No suspicious infiltrates or overt edema. The left lung base was not imaged in its entirety. IMPRESSION: No acute abnormalities are identified on this study. Electronically Signed   By: Dorise Bullion III M.D.   On: 05/11/2022 13:33    Procedures Procedures   Medications Ordered in ED Medications  acetaminophen (TYLENOL) tablet 650 mg (650 mg Oral Given 05/12/22 0629)  carvedilol (COREG) tablet 12.5 mg (12.5 mg Oral Given 05/12/22 0817)  furosemide (LASIX) injection 40 mg (40 mg Intravenous Given 05/12/22 0818)  isosorbide-hydrALAZINE (BIDIL) 20-37.5 MG per tablet 1 tablet (1 tablet Oral Given 05/12/22 0817)  finasteride (PROSCAR) tablet 5 mg (5 mg Oral Given 05/12/22 0818)  apixaban (ELIQUIS) tablet 5 mg (5 mg Oral Given 05/12/22 0817)  levETIRAcetam (KEPPRA) tablet 750 mg (750 mg Oral Given 05/12/22 0818)  mometasone-formoterol (DULERA) 200-5 MCG/ACT inhaler 2 puff (0 puffs Inhalation Hold 05/12/22 0800)  sodium chloride flush (NS) 0.9 % injection 3 mL (3 mLs Intravenous Given 05/12/22 0821)  sodium chloride flush (NS) 0.9 % injection 3 mL (has no administration in time range)  0.9 %  sodium chloride infusion (has no administration in time range)  acetaminophen (TYLENOL) tablet 650 mg (has no administration in time range)  ondansetron (ZOFRAN) injection 4 mg (has no administration in time range)  ALPRAZolam (XANAX) tablet 0.25 mg (0.25 mg Oral Given 05/11/22 2145)  albuterol (PROVENTIL) (2.5 MG/3ML) 0.083% nebulizer solution 2.5 mg (has no administration in time range)  magnesium oxide (MAG-OX) tablet 400 mg (400 mg Oral Given 05/12/22 0817)  lacosamide (VIMPAT) tablet 100 mg (100 mg Oral Given  05/12/22 0817)  ipratropium-albuterol (DUONEB) 0.5-2.5 (3) MG/3ML nebulizer solution 3 mL (3 mLs Nebulization Given 05/12/22 0752)  furosemide (  LASIX) injection 40 mg (40 mg Intravenous Given 05/11/22 1336)  ALPRAZolam (XANAX) tablet 0.25 mg (0.25 mg Oral Given 05/11/22 1519)    ED Course/ Medical Decision Making/ A&P Clinical Course as of 05/12/22 1031  Sun May 11, 2022  1331 Checks x-ray reviewed and interpreted by me as showing pulmonary congestion when compared to prior.  Likely reflective of volume overload. [RP]    Clinical Course User Index [RP] Fransico Meadow, MD                           Medical Decision Making Amount and/or Complexity of Data Reviewed Labs: ordered. Radiology: ordered.  Risk Prescription drug management. Decision regarding hospitalization.   71 year old male with a history of CHF (35%) on home 2L oxygen, prostate cancer, diabetes, CKD, DVT on eliquis, anemia, who presents to the emergency department with 4 days bilateral lower extremity swelling and shortness of breath.   Initial DDx: CHF, MI, PNA, PE   Plan:  Labs BNP Troponin EKG CXR IV Lasix Reassess  ED Summary:  Patient was monitored in the emergency department and had approximately 800 mL of urine output after his IV Lasix was given.  Initially placed on 6L for comfort but was weaned back down to his home 2L. His chest x-ray did show signs of mild volume overload on my read.  BNP was elevated at 480 which is higher than most recent at 257. He reported some improvement of his symptoms but was still significantly tachypneic and subjectively dyspneic after Lasix being given.  While in the emergency department did not develop cough or chest pain that would be suggestive of pulmonary embolism given the fact that he is frankly volume overloaded and has been compliant with his home [facility] feel that heart failure exacerbation and volume overload are much more likely.  EKG and troponins were reassuring.   Given the fact that the patient is not feeling significantly better with the Lasix and his urine output had decreased at home he was admitted for further diuresis and monitoring.   Additional history obtained from nursing home/care facility Records reviewed previous admission documents  I independently visualized the following imaging with scope of interpretation limited to determining acute life threatening conditions related to emergency care: CXR, which revealed volume overload  Social Determinants of health:  SNF resident   Final Clinical Impression(s) / ED Diagnoses Final diagnoses:  Chronic kidney disease, unspecified CKD stage  Acute on chronic congestive heart failure, unspecified heart failure type [facility])    Rx / DC Orders ED Discharge Orders     None         Fransico Meadow, MD 05/12/22 1031

## 2022-05-11 NOTE — H&P (Signed)
History and Physical    Tim Bradley IDP:[id number] DOB: December 12, 1950 DOA: 05/11/2022  PCP: [place] (Confirm with patient/family/NH records and if not entered, this has to be entered at [facility] point of entry) Patient coming from: SNF  I have personally briefly reviewed patient's old medical records in [place]  Chief Complaint: SOB  HPI: Tim Bradley is a 71 y.o. male with medical history significant of chronic HFrEF, CKD stage IV, DVT on Eliquis, HTN, HLD, COPD, IIDM diet-controlled, sent from nursing home for increasing exertional dyspnea and leg swelling.  Patient developed increasing shortness of breath and leg swelling for few days.  Patient was just hospitalized 10 days ago for similar presentation with shortness of breath and leg swelling.  According to nursing home report, patient also has been noncompliant with some of his medications including Eliquis.  Denies any chest pain, no cough no fever chills.  ED Course: Blood pressure significant elevated, SBP in the 80s, O2 saturation 93% on room air.  40 mg Lasix given and urine output 1200 ml, breathing symptoms improved.  Chest x-ray showed mild pulmonary congestion.  Review of Systems: As per HPI otherwise 14 point review of systems negative.    Past Medical History:  Diagnosis Date   Abscess 02/2017   LEFT GLUTEAL    Acid reflux    Alcohol abuse    Anemia    CHF (congestive heart failure) (HCC)    CKD (chronic kidney disease) stage 4, GFR 15-29 ml/min (HCC) 04/11/2020   Cocaine abuse ([place])    DDD (degenerative disc disease), lumbar    Diabetes mellitus without complication ([place])    Dyspnea    Enlarged prostate    Hypertension    Seizures ([place])    Spinal stenosis, lumbar     Past Surgical History:  Procedure Laterality Date   BACK SURGERY     cervical x2   BIOPSY  08/21/2018   Procedure: BIOPSY;  Surgeon: Ronald Lobo, MD;  Location: [place];  Service: Endoscopy;;    ESOPHAGOGASTRODUODENOSCOPY (EGD) WITH PROPOFOL N/A 08/21/2018   Procedure: ESOPHAGOGASTRODUODENOSCOPY (EGD) WITH PROPOFOL;  Surgeon: Ronald Lobo, MD;  [facility];  Service: Endoscopy;  Laterality: N/A;   INCISION AND DRAINAGE PERIRECTAL ABSCESS N/A 03/26/2017   Procedure: IRRIGATION AND DEBRIDEMENT PERIRECTAL ABSCESS;  Surgeon: Rolm Bookbinder, MD;  Location: [place];  Service: General;  Laterality: N/A;     reports that he quit smoking about 5 years ago. His smoking use included cigarettes. He has a 10.00 pack-year smoking history. He has never used smokeless tobacco. He reports that he does not currently use alcohol after a past usage of about 12.0 standard drinks of alcohol per week. He reports that he does not currently use drugs after having used the following drugs: Cocaine and Marijuana.  Allergies  Allergen Reactions   Gabapentin     unknown   Tramadol     unknown   Trazodone And Nefazodone Other (See Comments)    Family reports seizures from this medication    Family History  Problem Relation Age of Onset   Hypertension Mother    Cancer - Lung Father      Prior to Admission medications   Medication Sig Start Date End Date Taking? Authorizing Provider  acetaminophen (TYLENOL) 325 MG tablet Take 2 tablets (650 mg total) by mouth every 6 (six) hours as needed for mild pain or headache. 04/30/22   Arrien, Jimmy Picket, MD  apixaban (ELIQUIS) 5 MG TABS  tablet Take 1 tablet (5 mg total) by mouth 2 (two) times daily. Patient not taking: Reported on 01/24/2022 01/10/22   Horton, Alvin Critchley, DO  atorvastatin (LIPITOR) 80 MG tablet Take 1 tablet (80 mg total) by mouth daily. 01/10/22   Horton, Alvin Critchley, DO  capsaicin (ZOSTRIX) 0.025 % cream Apply topically 2 (two) times daily. 04/30/22   Arrien, Jimmy Picket, MD  carvedilol (COREG) 12.5 MG tablet Take 1 tablet (12.5 mg total) by mouth 2 (two) times daily. 04/30/22 05/30/22  Arrien, Jimmy Picket, MD  Cholecalciferol (QC  VITAMIN D3) 50 MCG (2000 UT) TABS Take 2,000 Units by mouth daily.    [provider]  finasteride (PROSCAR) 5 MG tablet Take 1 tablet by mouth daily.    [provider]  furosemide (LASIX) 20 MG tablet Take 3 tablets (60 mg total) by mouth daily. 04/30/22 05/30/22  Arrien, Jimmy Picket, MD  isosorbide-hydrALAZINE (BIDIL) 20-37.5 MG tablet Take 1 tablet by mouth 3 (three) times daily. 04/30/22 05/30/22  Arrien, Jimmy Picket, MD  Lacosamide 100 MG TABS Take 100 mg by mouth 2 (two) times daily.    [provider]  levETIRAcetam (KEPPRA) 750 MG tablet Take 1 tablet (750 mg total) by mouth 2 (two) times daily. 02/21/22   Ghimire, Henreitta Leber, MD  Magnesium Oxide 420 MG TABS Take 420 mg by mouth daily.    [provider]  mometasone-formoterol (DULERA) 200-5 MCG/ACT AERO Inhale 2 puffs into the lungs 2 (two) times daily. Patient not taking: Reported on 04/23/2022 01/31/22 03/02/22  Darliss Cheney, MD  naloxone [facility]) nasal spray 4 mg/0.1 mL Place 1 spray into the nose as needed (opioid overdose).    [provider]    Physical Exam: Vitals:   05/11/22 1300 05/11/22 1330 05/11/22 1400 05/11/22 1500  BP: (!) 161/93 (!) 175/103 (!) 178/92 (!) 178/87  Pulse: 67 (!) 57 68 (!) 30  Resp: 14 17 20 20   Temp:      TempSrc:      SpO2: 95% 100% 92% 93%    Constitutional: NAD, calm, comfortable Vitals:   05/11/22 1300 05/11/22 1330 05/11/22 1400 05/11/22 1500  BP: (!) 161/93 (!) 175/103 (!) 178/92 (!) 178/87  Pulse: 67 (!) 57 68 (!) 30  Resp: 14 17 20 20   Temp:      TempSrc:      SpO2: 95% 100% 92% 93%   Eyes: PERRL, lids and conjunctivae normal ENMT: Mucous membranes are moist. Posterior pharynx clear of any exudate or lesions.Normal dentition.  Neck: normal, supple, no masses, no thyromegaly Respiratory: clear to auscultation bilaterally, no wheezing, fine crackles on bilateral lower fields.  Increasing respiratory effort. No accessory muscle use.   Cardiovascular: Regular rate and rhythm, no murmurs / rubs / gallops. 2+ extremity edema. 2+ pedal pulses. No carotid bruits.  Abdomen: no tenderness, no masses palpated. No hepatosplenomegaly. Bowel sounds positive.  Musculoskeletal: no clubbing / cyanosis. No joint deformity upper and lower extremities. Good ROM, no contractures. Normal muscle tone.  Skin: no rashes, lesions, ulcers. No induration Neurologic: CN 2-12 grossly intact. Sensation intact, DTR normal. Strength 5/5 in all 4.  Psychiatric: Normal judgment and insight. Alert and oriented x 3. Normal mood.     Labs on Admission: I have personally reviewed following labs and imaging studies  CBC: Recent Labs  Lab 05/11/22 1256  WBC 6.8  NEUTROABS 4.3  HGB 10.3*  HCT 33.3*  MCV 89.8  PLT 532   Basic Metabolic Panel: Recent Labs  Lab 05/11/22 1256  NA 136  K 5.0  CL 107  CO2 24  GLUCOSE 103*  BUN 48*  CREATININE 2.60*  CALCIUM 9.1  MG 2.4   GFR: Estimated Creatinine Clearance: 35.1 mL/min (A) (by C-G formula based on SCr of 2.6 mg/dL (H)). Liver Function Tests: Recent Labs  Lab 05/11/22 1256  AST 14*  ALT 15  ALKPHOS 62  BILITOT 0.4  PROT 6.9  ALBUMIN 3.5   No results for input(s): "LIPASE", "AMYLASE" in the last 168 hours. No results for input(s): "AMMONIA" in the last 168 hours. Coagulation Profile: No results for input(s): "INR", "PROTIME" in the last 168 hours. Cardiac Enzymes: No results for input(s): "CKTOTAL", "CKMB", "CKMBINDEX", "TROPONINI" in the last 168 hours. BNP (last 3 results) No results for input(s): "PROBNP" in the last 8760 hours. HbA1C: No results for input(s): "HGBA1C" in the last 72 hours. CBG: No results for input(s): "GLUCAP" in the last 168 hours. Lipid Profile: No results for input(s): "CHOL", "HDL", "LDLCALC", "TRIG", "CHOLHDL", "LDLDIRECT" in the last 72 hours. Thyroid Function Tests: No results for input(s): "TSH", "T4TOTAL", "FREET4", "T3FREE", "THYROIDAB" in the  last 72 hours. Anemia Panel: No results for input(s): "VITAMINB12", "FOLATE", "FERRITIN", "TIBC", "IRON", "RETICCTPCT" in the last 72 hours. Urine analysis:    Component Value Date/Time   COLORURINE STRAW (A) 04/25/2022 2230   APPEARANCEUR CLEAR 04/25/2022 2230   LABSPEC 1.010 04/25/2022 2230   PHURINE 5.0 04/25/2022 2230   GLUCOSEU NEGATIVE 04/25/2022 2230   HGBUR NEGATIVE 04/25/2022 2230   BILIRUBINUR NEGATIVE 04/25/2022 2230   KETONESUR NEGATIVE 04/25/2022 2230   PROTEINUR 30 (A) 04/25/2022 2230   UROBILINOGEN 0.2 03/11/2012 2012   NITRITE NEGATIVE 04/25/2022 2230   LEUKOCYTESUR NEGATIVE 04/25/2022 2230    Radiological Exams on Admission: DG Chest Port 1 View  Result Date: 05/11/2022 CLINICAL DATA:  Shortness of breath EXAM: PORTABLE CHEST 1 VIEW COMPARISON:  April 24, 2022 FINDINGS: Stable cardiomegaly. The hila, mediastinum, and pleura are unchanged. Haziness over the bases is likely due to overlapping soft tissues and portable technique. No suspicious infiltrates or overt edema. The left lung base was not imaged in its entirety. IMPRESSION: No acute abnormalities are identified on this study. Electronically Signed   By: Dorise Bullion III M.D.   On: 05/11/2022 13:33    EKG: Independently reviewed.  Sinus, chronic RBBB  Assessment/Plan Principal Problem:   CHF (congestive heart failure) (HCC) Active Problems:   Acute combined systolic and diastolic congestive heart failure ([place])  (please populate well all problems here in Problem List. (For example, if patient is on BP meds at home and you resume or decide to hold them, it is a problem that needs to be her. Same for CAD, COPD, HLD and so on)  Acute on chronic HFrEF decompensation -Significant fluid overload and uncontrolled hypertension, suspect noncompliance with home CHF/BP meds. -Continue aggressive diuresis Lasix 40 mg twice daily IV -Resume home BP meds including Coreg, Bidil -Daily weight and I&O's  HTN,  uncontrolled -Suspect noncompliance -Resume home BP meds as above -As needed hydralazine  Acute COPD exacerbation -Start DuoNeb every 6 hours, as needed albuterol, continue Dulera  IIDM -A1c 6.9, continue current diet control.  DVT -Continue Eliquis  DVT prophylaxis: Eliquis Code Status: DNR Family Communication: None at bedside Disposition Plan: Expect less than 2 midnight hospital stay Consults called: None Admission status: Telemetry observation   Lequita Halt MD [facility] Pager [phone]  05/11/2022, 4:27 PM

## 2022-05-12 ENCOUNTER — Observation Stay (HOSPITAL_COMMUNITY): Payer: No Typology Code available for payment source

## 2022-05-12 ENCOUNTER — Telehealth (HOSPITAL_COMMUNITY): Payer: Self-pay | Admitting: *Deleted

## 2022-05-12 ENCOUNTER — Encounter (HOSPITAL_COMMUNITY): Payer: No Typology Code available for payment source

## 2022-05-12 DIAGNOSIS — I429 Cardiomyopathy, unspecified: Secondary | ICD-10-CM | POA: Diagnosis present

## 2022-05-12 DIAGNOSIS — I130 Hypertensive heart and chronic kidney disease with heart failure and stage 1 through stage 4 chronic kidney disease, or unspecified chronic kidney disease: Secondary | ICD-10-CM | POA: Diagnosis present

## 2022-05-12 DIAGNOSIS — I5023 Acute on chronic systolic (congestive) heart failure: Secondary | ICD-10-CM

## 2022-05-12 DIAGNOSIS — Z66 Do not resuscitate: Secondary | ICD-10-CM | POA: Diagnosis present

## 2022-05-12 DIAGNOSIS — Z91148 Patient's other noncompliance with medication regimen for other reason: Secondary | ICD-10-CM | POA: Diagnosis not present

## 2022-05-12 DIAGNOSIS — Z6835 Body mass index (BMI) 35.0-35.9, adult: Secondary | ICD-10-CM | POA: Diagnosis not present

## 2022-05-12 DIAGNOSIS — E119 Type 2 diabetes mellitus without complications: Secondary | ICD-10-CM | POA: Diagnosis not present

## 2022-05-12 DIAGNOSIS — N179 Acute kidney failure, unspecified: Secondary | ICD-10-CM | POA: Diagnosis present

## 2022-05-12 DIAGNOSIS — I10 Essential (primary) hypertension: Secondary | ICD-10-CM | POA: Diagnosis not present

## 2022-05-12 DIAGNOSIS — N184 Chronic kidney disease, stage 4 (severe): Secondary | ICD-10-CM

## 2022-05-12 DIAGNOSIS — I509 Heart failure, unspecified: Secondary | ICD-10-CM | POA: Diagnosis not present

## 2022-05-12 DIAGNOSIS — J441 Chronic obstructive pulmonary disease with (acute) exacerbation: Secondary | ICD-10-CM | POA: Diagnosis present

## 2022-05-12 DIAGNOSIS — I5043 Acute on chronic combined systolic (congestive) and diastolic (congestive) heart failure: Secondary | ICD-10-CM | POA: Diagnosis present

## 2022-05-12 DIAGNOSIS — Z8546 Personal history of malignant neoplasm of prostate: Secondary | ICD-10-CM | POA: Diagnosis not present

## 2022-05-12 DIAGNOSIS — R5381 Other malaise: Secondary | ICD-10-CM | POA: Diagnosis present

## 2022-05-12 DIAGNOSIS — N400 Enlarged prostate without lower urinary tract symptoms: Secondary | ICD-10-CM | POA: Diagnosis present

## 2022-05-12 DIAGNOSIS — Z20822 Contact with and (suspected) exposure to covid-19: Secondary | ICD-10-CM | POA: Diagnosis present

## 2022-05-12 DIAGNOSIS — R0602 Shortness of breath: Secondary | ICD-10-CM | POA: Diagnosis present

## 2022-05-12 DIAGNOSIS — I447 Left bundle-branch block, unspecified: Secondary | ICD-10-CM | POA: Diagnosis present

## 2022-05-12 DIAGNOSIS — E1122 Type 2 diabetes mellitus with diabetic chronic kidney disease: Secondary | ICD-10-CM | POA: Diagnosis present

## 2022-05-12 DIAGNOSIS — E669 Obesity, unspecified: Secondary | ICD-10-CM | POA: Diagnosis present

## 2022-05-12 DIAGNOSIS — Z993 Dependence on wheelchair: Secondary | ICD-10-CM | POA: Diagnosis not present

## 2022-05-12 DIAGNOSIS — F419 Anxiety disorder, unspecified: Secondary | ICD-10-CM | POA: Diagnosis present

## 2022-05-12 DIAGNOSIS — R569 Unspecified convulsions: Secondary | ICD-10-CM | POA: Diagnosis present

## 2022-05-12 DIAGNOSIS — E785 Hyperlipidemia, unspecified: Secondary | ICD-10-CM | POA: Diagnosis present

## 2022-05-12 DIAGNOSIS — I82501 Chronic embolism and thrombosis of unspecified deep veins of right lower extremity: Secondary | ICD-10-CM | POA: Diagnosis present

## 2022-05-12 DIAGNOSIS — Z8249 Family history of ischemic heart disease and other diseases of the circulatory system: Secondary | ICD-10-CM | POA: Diagnosis not present

## 2022-05-12 DIAGNOSIS — Z7401 Bed confinement status: Secondary | ICD-10-CM | POA: Diagnosis not present

## 2022-05-12 DIAGNOSIS — J9601 Acute respiratory failure with hypoxia: Secondary | ICD-10-CM | POA: Diagnosis present

## 2022-05-12 LAB — BASIC METABOLIC PANEL
Anion gap: 6 (ref 5–15)
BUN: 53 mg/dL — ABNORMAL HIGH (ref 8–23)
CO2: 25 mmol/L (ref 22–32)
Calcium: 9.1 mg/dL (ref 8.9–10.3)
Chloride: 107 mmol/L (ref 98–111)
Creatinine, Ser: 3.03 mg/dL — ABNORMAL HIGH (ref 0.61–1.24)
GFR, Estimated: 21 mL/min — ABNORMAL LOW (ref >60)
Glucose, Bld: 139 mg/dL — ABNORMAL HIGH (ref 70–99)
Potassium: 4.9 mmol/L (ref 3.5–5.1)
Sodium: 138 mmol/L (ref 135–145)

## 2022-05-12 LAB — GLUCOSE, CAPILLARY: Glucose-Capillary: 148 mg/dL — ABNORMAL HIGH (ref 70–99)

## 2022-05-12 MED ORDER — FUROSEMIDE 10 MG/ML IJ SOLN
60.0000 mg | Freq: Two times a day (BID) | INTRAMUSCULAR | Status: DC
  Administered 2022-05-12: 60 mg via INTRAVENOUS
  Filled 2022-05-12: qty 6

## 2022-05-12 NOTE — Progress Notes (Signed)
Heart Failure Nurse Navigator Progress Note  PCP: [facility]iologist: [facility]  Admission Diagnosis: Chronic kidney disease, Acute on chronic congestive heart failure.  Admitted from: [facility] via EMS  Presentation:   Tim Bradley presented with shortness of breath from SNF increased bilateral lower extremity edema. Wears 2 L oxygen at baseline, Resident at [facility] , [place]. BP 140/82, HR 74, 3+ edema to lower extremities,  BNP 480.5, Creat 3.03, IV lasix, given, CXR showed pulmonary congestion reflective of volume overload.  ECHO/ LVEF: 35-40%  Patient recently hospitalized from 04/24/22 - 04/30/2022 for heart failure, Patient was educated on the sign and symptoms of heart failure, daily weights, ( states the SNF weighs him, but not every day) Diet/ fluid restrictions, ( states he doesn't eat salt, and he will only drink a soda every once in a while.) Taking all his medications as prescribed, ( patient states he doesn't know what medications he takes, but whatever they give him there he takes".) Compliance with attending all his medical appointments, states either the facility or his sister takes him.) A scheduled HF TOC appointment is 05/26/2022 @ 11 am.   Clinical Course:  Past Medical History:  Diagnosis Date   Abscess 02/2017   LEFT GLUTEAL    Acid reflux    Alcohol abuse    Anemia    CHF (congestive heart failure) (HCC)    CKD (chronic kidney disease) stage 4, GFR 15-29 ml/min (HCC) 04/11/2020   Cocaine abuse (HCC)    DDD (degenerative disc disease), lumbar    Diabetes mellitus without complication (HCC)    Dyspnea    Enlarged prostate    Hypertension    Seizures ([place])    Spinal stenosis, lumbar      Social History   Socioeconomic History   Marital status: Widowed    Spouse name: Not on file   Number of children: 0   Years of education: Not on file   Highest education level: High school graduate  Occupational History   Occupation: retired  Tobacco Use    Smoking status: Former    Packs/day: 0.25    Years: 40.00    Total pack years: 10.00    Types: Cigarettes    Quit date: 2018    Years since quitting: 5.6   Smokeless tobacco: Never  Vaping Use   Vaping Use: Never used  Substance and Sexual Activity   Alcohol use: Not Currently    Alcohol/week: 12.0 standard drinks of alcohol    Types: 12 Cans of beer per week    Comment: states he quit 3 years ago   Drug use: Not Currently    Types: Cocaine, Marijuana    Comment: 03/2020    Sexual activity: Not Currently  Other Topics Concern   Not on file  Social History Narrative   [place] veteran   Social Determinants of Health   Financial Resource Strain: Medium Risk (05/06/2018)   Overall Financial Resource Strain (CARDIA)    Difficulty of Paying Living Expenses: Somewhat hard  Food Insecurity: No Food Insecurity (05/12/2022)   Hunger Vital Sign    Worried About Running Out of Food in the Last Year: Never true    [place] in the Last Year: Never true  Transportation Needs: No Transportation Needs (05/12/2022)   PRAPARE - Hydrologist (Medical): No    Lack of Transportation (Non-Medical): No  Physical Activity: Inactive (05/06/2018)   Exercise Vital Sign  Days of Exercise per Week: 0 days    Minutes of Exercise per Session: 0 min  Stress: No Stress Concern Present (05/06/2018)   [place]    Feeling of Stress : Only a little  Social Connections: Moderately Isolated (05/06/2018)   Social Connection and Isolation Panel [NHANES]    Frequency of Communication with Friends and Family: More than three times a week    Frequency of Social Gatherings with Friends and Family: Once a week    Attends Religious Services: Never    Marine scientist or Organizations: No    Attends Archivist Meetings: Never    Marital Status: Widowed   Education Assessment and Provision:  Detailed  education and instructions provided on heart failure disease management including the following:  Signs and symptoms of Heart Failure When to call the physician Importance of daily weights Low sodium diet Fluid restriction Medication management Anticipated future follow-up appointments  Patient education given on each of the above topics.  Patient acknowledges understanding via teach back method and acceptance of all instructions.  Education Materials:  "Living Better With Heart Failure" Booklet, HF zone tool, & Daily Weight Tracker Tool.  Patient has scale at home: yes, at SNF Patient has pill box at home: NA   High Risk Criteria for Readmission and/or Poor Patient Outcomes: Heart failure hospital admissions (last 6 months): 2  No Show rate: 2 % Difficult social situation: No Demonstrates medication adherence: Yes, per patient Primary Language: English Literacy level: Reading, writing, and comprehension  Barriers of Care:   Medication compliance Diet/ fluid restrictions Daily weights  Considerations/Referrals:   Referral made to Heart Failure Pharmacist Stewardship: yes Referral made to Heart Failure CSW/NCM TOC: No Referral made to Heart & Vascular TOC clinic: Yes, 05/26/2022 @ 11 am  Items for Follow-up on DC/TOC: Medication compliance Diet/ fluid restrictions   [name], BSN, RN Heart Failure Transport planner Only

## 2022-05-12 NOTE — Assessment & Plan Note (Signed)
Renal function with serum cr at 3,0 with K at 4,9 and serum bicarbonate at 25. Patient continue with volume overload.   Plan to increase furosemide to 60 mg IV q12 hr Continue close monitoring on blood pressure.  Follow up renal function in am.

## 2022-05-12 NOTE — Hospital Course (Addendum)
Tim Bradley was admitted to the hospital with the working diagnosis of decompensated heart failure.  71 yo male with the past medical history of heart failure, CKD stage IV, DVT, hypertension, T2DM and COPD who presented with dyspnea. Patient reported progressive dyspnea and lower extremity edema for a few days. Recent hospitalization for heart failure, 04/24/22 to 04/30/22, discharged to SNF. Apparently furosemide was held at the nursing home.  In the ED his blood pressure was 161/93, HR 67, RR 14 and 02 saturation 93%, lungs with rales and increased work of breathing, heart with S1 and S2 present and rhythmic with no gallops or rubs, abdomen not distended, positive lower extremity edema.   Na 136, K 5,0 cl 107 bicarbonate 24 glucose 103 bun 48 cr 2,60 BNP 480  Wbc 6,8 hgb 10,3 plt 221  Sars covid 19 negative   Chest radiograph with cardiomegaly, bilateral interstitial infiltrates predominant at the lower lobes with cephalization of the vasculature and bilateral hilar congestion.   EKG 63 bpm, left axis deviation, left bundle branch block, sinus rhythm with J point elevation V1 to V4 with no significant ST segment or T wave changes.  Patient was placed on IV furosemide for diuresis. His volume status has improved, but renal function not yet stable. Possible return to SNF in 24 hrs.

## 2022-05-12 NOTE — Assessment & Plan Note (Signed)
Fasting glucose is 139 today, Plan to continue glucose cover and monitoring with insulin sliding scale.

## 2022-05-12 NOTE — Assessment & Plan Note (Signed)
Patient with no wheezing or increased sputum production.  Ruled out acute exacerbation.   Plan to continue bronchodilator therapy and supplemental 02 per [place].

## 2022-05-12 NOTE — Assessment & Plan Note (Signed)
Uncontrolled hypertension, plan to increase bidil to 2 tabs tid and continue carvedilol. Will hold on furosemide for today.

## 2022-05-12 NOTE — Assessment & Plan Note (Signed)
Calculated BMI is 35,3

## 2022-05-12 NOTE — Assessment & Plan Note (Signed)
Right lower extremity DVT  Continue anticoagulation with apixaban.

## 2022-05-12 NOTE — Progress Notes (Signed)
Heart Failure Stewardship Pharmacist Progress Note   [facility] PCP-Cardiologist: Jenkins Rouge, MD    HPI:  71 yo M with PMH of CHF, NSTEMI, HTN, DVT, seizures, T2DM, OSA, wheelchair bound due to severe spinal stenosis, and prostate cancer.    He was initially diagnosed with HF in 03/2020 when he presented to the hospital complaining of leg edema and weakness. ECHO at that time with EF <20% and RV mildly reduced. He was diuresed and left AMA on 04/13/20. He was discharged with lasix, carvedilol, and BiDil in the setting of AKI.    He presented back to the hospital on 04/21/20 with acute decompensated HF. He stated at this encounter that he had not picked up the medications due to financial reasons. He was diuresed and discharged on the same medications.    He was back at the hospital in 04/2020 with acute CHF and suspected seizure. He underwent stress test at that time. Significant perfusion defect of the entire anterior and inferior wall that is slightly worse at stress. However, does not appear to be severely abnormal, and distribution does not explain global hypokinesis. Likely infarct with small amount of worsening ischemia with stress, but cardiomyopathy out of proportion to ischemia/infarction. Diuresed and discharged with carvedilol, BiDil, and lasix PRN.   Hospitalized in 11/2021 with acute CHF. He ran out of home diuretics. Diuresed and discharged with torsemide, carvedilol, and restarted BiDil. ECHO from OSH in Feb 2023 with EF 40-45%.   Hospitalized from 7/27-8/3 with acute CHF. Diuresed and discharged to SNF with lasix 60 mg daily, carvedilol, and BiDil.  He presented to the ED on 8/13 with edema and shortness of breath. Reported that urine output was decreasing and O2 requirements increasing at SNF. CXR without overt edema.   Current HF Medications: Beta Blocker: carvedilol 12.5 mg BID Other: BiDil 1 tab TID  Prior to admission HF Medications (per SNF): Diuretic:  furosemide 40 mg daily Beta blocker: carvedilol 12.5 mg BID ** not taking BiDil  Pertinent Lab Values: Serum creatinine 3.26, BUN 60, Potassium 5.0, Sodium 139, BNP 480.5, Magnesium 2.4, A1c 6.9   Vital Signs: Weight: 273 lbs (admission weight: 274 lbs) Blood pressure: 140/80s  Heart rate: 60-70s  I/O: -2.9L yesterday; net -4.2L  Medication Assistance / Insurance Benefits Check: Does the patient have prescription insurance?  Yes Type of insurance plan: Medicare + [facility]  Outpatient Pharmacy:  Prior to admission outpatient pharmacy: SNF  Assessment: 1. Acute on chronic systolic CHF (LVEF 80-32%), due to NICM. NYHA class II symptoms. - Off IV lasix. Strict I/O. Daily weights. Keep K>4 and Mag>2. Recommend restarting furosemide 60 mg daily at discharge. It appears he was only receiving 40 mg daily. - Continue carvedilol 12.5 mg BID - No ACE/ARB/ARNI, MRA, or SGLT2i with AKI on advanced CKD (eGFR <20) - Continue BiDil 20/37.5 mg 1 tab TID   Plan: 1) Medication changes recommended at this time: - Furosemide 60 mg daily at discharge  2) Patient assistance: - None needed - SNF at discharge  Kerby Nora, PharmD, BCPS Heart Failure Stewardship Pharmacist Phone [phone]

## 2022-05-12 NOTE — NC FL2 (Signed)
[place] LEVEL OF CARE SCREENING TOOL     IDENTIFICATION  Patient Name: Tim Bradley Birthdate: 19-Aug-1951 Sex: male Admission Date (Current Location): 05/11/2022  [facility] and [place] Number:  Herbalist and Address:  [facility]. [facility] [place] [address], [place], [place] [address]      Provider Number: [id number]  Attending Physician Name and Address:  Tawni Millers,*  Relative Name and Phone Number:  [name] (Sister)   [phone] [facility])    Current Level of Care: Hospital Recommended Level of Care: [place] Prior Approval Number:    Date Approved/Denied:   PASRR Number: [id number]  Discharge Plan: SNF    Current Diagnoses: Patient Active Problem List   Diagnosis Date Noted   CHF (congestive heart failure) ([place]) 05/11/2022   History of prostate cancer 04/25/2022   Obesity (BMI 30-39.9) 04/25/2022   Recurrent seizures ([place]) 02/20/2022   E coli bacteremia 01/29/2022   Situational anxiety 01/26/2022   Physical deconditioning 01/26/2022   CKD (chronic kidney disease) stage 4, GFR 15-29 ml/min ([place]) 01/25/2022   Anemia of chronic disease 01/25/2022   Sepsis secondary to UTI ([place]) 01/24/2022   Acute cystitis 01/24/2022   Class 1 obesity 12/22/2021   Acute on chronic systolic heart failure ([place]) 12/21/2021   DNR (do not resuscitate) 12/21/2021   DVT (deep venous thrombosis) ([place]) 12/21/2021   Substance induced mood disorder ([place]) 10/27/2020   Right leg DVT ([place]) 09/30/2020   Seizure disorder ([place]) 94/70/9628   Chronic systolic CHF (congestive heart failure) ([place]) 09/28/2020   AKI (acute kidney injury) ([place]) 09/28/2020   Pressure injury of skin 05/03/2020   Acute encephalopathy 05/02/2020   Diarrhea 05/02/2020   Acute combined systolic and diastolic congestive heart failure ([place]) 04/22/2020   Essential hypertension 04/11/2020   Leg edema 04/11/2020   Dyspnea 04/11/2020   Acute on chronic  congestive heart failure ([place]) 04/11/2020   BPH (benign prostatic hyperplasia) 04/11/2020   Acute kidney injury superimposed on chronic kidney disease ([place]) 04/11/2020   Generalized weakness 04/11/2020   Seizure ([place]) 07/21/2019   Cocaine abuse ([place])    Constipation 08/22/2018   Gastritis 08/20/2018   Heme positive stool    Acute blood loss anemia secondary to massive gastric ulcer    Hyponatremia    Abscess, gluteal, left 03/26/2017   Type 2 diabetes mellitus with hyperlipidemia ([place]) 03/26/2017   Adrenal nodule ([place]) 03/26/2017   Closed fracture of body of thoracic vertebra ([place]) 03/26/2017   Spinal stenosis of lumbar region 03/26/2017    Orientation RESPIRATION BLADDER Height & Weight     Time, Self, Situation, Place  O2 (2LNC) Incontinent, External catheter Weight: 274 lb 14.6 oz (124.7 kg) Height:     BEHAVIORAL SYMPTOMS/MOOD NEUROLOGICAL BOWEL NUTRITION STATUS      Incontinent Diet (See d/c summary)  AMBULATORY STATUS COMMUNICATION OF NEEDS Skin   Extensive Assist Verbally Normal                       Personal Care Assistance Level of Assistance  Bathing, Feeding, Dressing Bathing Assistance: Maximum assistance Feeding assistance: Independent Dressing Assistance: Limited assistance     Functional Limitations Info  Sight, Hearing, Speech Sight Info: Adequate Hearing Info: Adequate Speech Info: Adequate    SPECIAL CARE FACTORS FREQUENCY  OT (By licensed OT), PT (By licensed PT)     PT Frequency: 5x/week OT Frequency: 5x/week  Contractures Contractures Info: Not present    Additional Factors Info  Code Status, Allergies Code Status Info: Full code Allergies Info: Trazodone And Nefazodone, gabapentin, tramadol           Current Medications (05/12/2022):  This is the current hospital active medication list Current Facility-Administered Medications  Medication Dose Route Frequency Provider Last Rate Last Admin   0.9 %  sodium chloride  infusion  250 mL Intravenous PRN Wynetta Fines T, MD       acetaminophen (TYLENOL) tablet 650 mg  650 mg Oral Q6H PRN Wynetta Fines T, MD   650 mg at 05/12/22 0254   acetaminophen (TYLENOL) tablet 650 mg  650 mg Oral Q4H PRN Wynetta Fines T, MD       albuterol (PROVENTIL) (2.5 MG/3ML) 0.083% nebulizer solution 2.5 mg  2.5 mg Nebulization Q6H PRN Wynetta Fines T, MD       ALPRAZolam Duanne Moron) tablet 0.25 mg  0.25 mg Oral TID PRN Wynetta Fines T, MD   0.25 mg at 05/11/22 2145   apixaban (ELIQUIS) tablet 5 mg  5 mg Oral BID Wynetta Fines T, MD   5 mg at 05/12/22 0817   carvedilol (COREG) tablet 12.5 mg  12.5 mg Oral BID Wynetta Fines T, MD   12.5 mg at 05/12/22 0817   finasteride (PROSCAR) tablet 5 mg  5 mg Oral Daily Wynetta Fines T, MD   5 mg at 05/12/22 0818   furosemide (LASIX) injection 40 mg  40 mg Intravenous BID Wynetta Fines T, MD   40 mg at 05/12/22 0818   ipratropium-albuterol (DUONEB) 0.5-2.5 (3) MG/3ML nebulizer solution 3 mL  3 mL Nebulization TID Wynetta Fines T, MD   3 mL at 05/12/22 0752   isosorbide-hydrALAZINE (BIDIL) 20-37.5 MG per tablet 1 tablet  1 tablet Oral TID Lequita Halt, MD   1 tablet at 05/12/22 0817   lacosamide (VIMPAT) tablet 100 mg  100 mg Oral BID Wynetta Fines T, MD   100 mg at 05/12/22 0817   levETIRAcetam (KEPPRA) tablet 750 mg  750 mg Oral BID Wynetta Fines T, MD   750 mg at 05/12/22 0818   magnesium oxide (MAG-OX) tablet 400 mg  400 mg Oral Daily Wynetta Fines T, MD   400 mg at 05/12/22 0817   mometasone-formoterol (DULERA) 200-5 MCG/ACT inhaler 2 puff  2 puff Inhalation BID Wynetta Fines T, MD   2 puff at 05/12/22 0755   ondansetron (ZOFRAN) injection 4 mg  4 mg Intravenous Q6H PRN Wynetta Fines T, MD       sodium chloride flush (NS) 0.9 % injection 3 mL  3 mL Intravenous Q12H Wynetta Fines T, MD   3 mL at 05/12/22 2706   sodium chloride flush (NS) 0.9 % injection 3 mL  3 mL Intravenous PRN Lequita Halt, MD         Discharge Medications: Please see discharge summary for a list of  discharge medications.  Relevant Imaging Results:  Relevant Lab Results:   Additional Information [phone]  Bethann Berkshire, LCSW

## 2022-05-12 NOTE — Assessment & Plan Note (Addendum)
Echocardiogram with reduced LV systolic function EF 35 to 40% with global hypokinesis, moderate LVH, RV systolic function preserved. Not able to assess RV pressures.   Urine output is 3,250 ml over last 24 hrs Systolic blood pressure is 130 to 160 mmHg,   Patient continue with volume overload, plan to increase furosemide to 60 mg IV q12 hrs Continue blood pressure control with carvedilol and bidil for after load reduction.

## 2022-05-12 NOTE — Telephone Encounter (Signed)
Heart Failure Nurse Navigator Progress Note   Cancelled appointment for 12 noon on 05/12/22 patient currently in [place], BSN, RN Heart Failure Nurse Navigator Secure Chat Only

## 2022-05-12 NOTE — Assessment & Plan Note (Signed)
No active seizures, plan to continue with Vimpat and keppra.

## 2022-05-12 NOTE — TOC Initial Note (Addendum)
Transition of Care [facility]) - Initial/Assessment Note    Patient Details  Name: Tim Bradley MRN: [id number] Date of Birth: 09-24-1951  [facility]) CM/SW Contact:    Bethann Berkshire, [place] Phone Number: 05/12/2022, 11:17 AM  Clinical Narrative:                  CSW is informed pt may DC today and is from [facility] SNF. CSW called [name] SN[facility] liaison; no answer and voicemailbox full. CSW sent text message notifying of possible DC today; awaiting response.  1205: CSW called liaison again; no answer. Called [name] main line, and informed that liaison is not in and transferred to [place] who is [facility] of [name] transitional care admissions. No answer, left voicemail requesting return call.   1400: CSW is able to make contact with Tim Bradley. Updated him that dc actually anticipated for tomorrow.     Expected Discharge Plan: Skilled Nursing Facility Barriers to Discharge: No Barriers Identified   Patient Goals and CMS Choice        Expected Discharge Plan and Services Expected Discharge Plan: [place]       Living arrangements for the past 2 months: [place]                                      Prior Living Arrangements/Services Living arrangements for the past 2 months: [place] Lives with:: Facility Resident                   Activities of Daily Living   ADL Screening (condition at time of admission) Patient's cognitive ability adequate to safely complete daily activities?: Yes Is the patient deaf or have difficulty hearing?: No Does the patient have difficulty seeing, even when wearing glasses/contacts?: No Does the patient have difficulty concentrating, remembering, or making decisions?: No Patient able to express need for assistance with ADLs?: Yes Does the patient have difficulty dressing or bathing?: Yes Independently performs ADLs?: No Communication: Independent Dressing (OT): Needs  assistance Is this a change from baseline?: Pre-admission baseline Grooming: Needs assistance Is this a change from baseline?: Pre-admission baseline Feeding: Independent Bathing: Needs assistance Is this a change from baseline?: Pre-admission baseline Toileting: Needs assistance Is this a change from baseline?: Pre-admission baseline In/Out Bed: Needs assistance Is this a change from baseline?: Pre-admission baseline Walks in Home: Dependent Is this a change from baseline?: Pre-admission baseline Does the patient have difficulty walking or climbing stairs?: Yes Weakness of Legs: Both Weakness of Arms/Hands: None  Permission Sought/Granted                  Emotional Assessment              Admission diagnosis:  CHF (congestive heart failure) ([place]) [I50.9] Patient Active Problem List   Diagnosis Date Noted   CHF (congestive heart failure) ([place]) 05/11/2022   History of prostate cancer 04/25/2022   Obesity (BMI 30-39.9) 04/25/2022   Recurrent seizures ([place]) 02/20/2022   E coli bacteremia 01/29/2022   Situational anxiety 01/26/2022   Physical deconditioning 01/26/2022   CKD (chronic kidney disease) stage 4, GFR 15-29 ml/min ([place]) 01/25/2022   Anemia of chronic disease 01/25/2022   Sepsis secondary to UTI ([place]) 01/24/2022   Acute cystitis 01/24/2022   Class 1 obesity 12/22/2021   Acute on chronic systolic heart failure ([place]) 12/21/2021   DNR (do not  resuscitate) 12/21/2021   DVT (deep venous thrombosis) ([place]) 12/21/2021   Substance induced mood disorder ([place]) 10/27/2020   Right leg DVT ([place]) 09/30/2020   Seizure disorder ([place]) 56/38/9373   Chronic systolic CHF (congestive heart failure) ([place]) 09/28/2020   AKI (acute kidney injury) ([place]) 09/28/2020   Pressure injury of skin 05/03/2020   Acute encephalopathy 05/02/2020   Diarrhea 05/02/2020   Acute combined systolic and diastolic congestive heart failure ([place]) 04/22/2020   Essential hypertension 04/11/2020   Leg  edema 04/11/2020   Dyspnea 04/11/2020   Acute on chronic congestive heart failure ([place]) 04/11/2020   BPH (benign prostatic hyperplasia) 04/11/2020   Acute kidney injury superimposed on chronic kidney disease ([place]) 04/11/2020   Generalized weakness 04/11/2020   Seizure ([place]) 07/21/2019   Cocaine abuse ([place])    Constipation 08/22/2018   Gastritis 08/20/2018   Heme positive stool    Acute blood loss anemia secondary to massive gastric ulcer    Hyponatremia    Abscess, gluteal, left 03/26/2017   Type 2 diabetes mellitus with hyperlipidemia ([place]) 03/26/2017   Adrenal nodule ([place]) 03/26/2017   Closed fracture of body of thoracic vertebra ([place]) 03/26/2017   Spinal stenosis of lumbar region 03/26/2017   PCP:  [place]:   [place], [place] - [address] [place] [address] Phone: [phone] Fax: [phone]  [id number] - [place], [place]. [address] [place] [address] Phone: [phone] Fax: [place], [place] [address] [place] [address] Phone: [phone] Fax: [phone]     Social Determinants of Health (SDOH) Interventions    Readmission Risk Interventions    10/01/2020   12:53 PM  Readmission Risk Prevention Plan  Transportation Screening Complete  Medication Review (RN Care Manager) Complete  PCP or Specialist appointment within 3-5 days of discharge Complete  HRI or Home Care Consult Complete  SW Recovery Care/Counseling Consult Complete  Palliative Care Screening [place] Patient Refused

## 2022-05-12 NOTE — Progress Notes (Addendum)
Progress Note   Patient: Tim Bradley [id number] DOB: 1951-01-26 DOA: 05/11/2022     0 DOS: the patient was seen and examined on 05/12/2022   Brief hospital course: Mr. Tim Bradley was admitted to the hospital with the working diagnosis of decompensated heart failure.  71 yo male with the past medical history of heart failure, CKD stage IV, DVT, hypertension, T2DM and COPD who presented with dyspnea. Patient reported progressive dyspnea and lower extremity edema for a few days. Recent hospitalization for heart failure, 04/24/22 to 04/30/22, discharged to SNF.  In the ED his blood pressure was 161/93, HR 67, RR 14 and 02 saturation 93%, lungs with rales and increased work of breathing, heart with S1 and S2 present and rhythmic with no gallops or rubs, abdomen not distended, positive lower extremity edema.   Na 136, K 5,0 cl 107 bicarbonate 24 glucose 103 bun 48 cr 2,60 BNP 480  Wbc 6,8 hgb 10,3 plt 221  Sars covid 19 negative   Chest radiograph with cardiomegaly, bilateral interstitial infiltrates predominant at the lower lobes with cephalization of the vasculature and bilateral hilar congestion.   EKG 63 bpm, left axis deviation, left bundle branch block, sinus rhythm with J point elevation V1 to V4 with no significant ST segment or T wave changes.  Patient was placed on IV furosemide for diuresis.     Assessment and Plan: Acute on chronic systolic CHF (congestive heart failure) (HCC) Echocardiogram with reduced LV systolic function EF 35 to 40% with global hypokinesis, moderate LVH, RV systolic function preserved. Not able to assess RV pressures.   Urine output is 3,250 ml over last 24 hrs Systolic blood pressure is 130 to 160 mmHg,   Patient continue with volume overload, plan to increase furosemide to 60 mg IV q12 hrs Continue blood pressure control with carvedilol and bidil for after load reduction.   CKD (chronic kidney disease) stage 4, GFR 15-29 ml/min (HCC) Renal function  with serum cr at 3,0 with K at 4,9 and serum bicarbonate at 25. Patient continue with volume overload.   Plan to increase furosemide to 60 mg IV q12 hr Continue close monitoring on blood pressure.  Follow up renal function in am.   Hypertension Continue blood pressure control with carvedilol and bidil Diuresis with furosemide.   Diabetes mellitus without complication (HCC) Fasting glucose is 139 today, Plan to continue glucose cover and monitoring with insulin sliding scale.   Chronic deep vein thrombosis (DVT) (HCC) Right lower extremity DVT  Continue anticoagulation with apixaban.  COPD (chronic obstructive pulmonary disease) ([place]) Patient with no wheezing or increased sputum production.  Ruled out acute exacerbation.   Plan to continue bronchodilator therapy and supplemental 02 per [place].   Seizures ([place]) No active seizures, plan to continue with Vimpat and keppra.   Class 2 obesity Calculated BMI is 35,3         Subjective: patient with improvement in dyspnea but not back to baseline, continue to have edema at his lower extremities, possible dietary indiscretion as outpatient,   Physical Exam: Vitals:   05/12/22 0755 05/12/22 0800 05/12/22 1100 05/12/22 1315  BP:  (!) 140/82 (!) 143/85   Pulse:  74 68   Resp:  16 16   Temp:   97.7 F (36.5 C)   TempSrc:      SpO2: 94% 98% 100% 92%  Weight:       Neurology awake and alert ENT with no pallor or icterus Cardiovascular with S1 and S2 present  and rhythmic with no gallops, positive murmur at the apex  No JVD Positive edema pitting at the thighs Respiratory with no rales or wheezing Abdomen not distended  Data Reviewed:    Family Communication: no family at the bedside   Disposition: Status is: Inpatient Remains inpatient appropriate because: heart failure   Planned Discharge Destination: Skilled nursing facility    Author: Tawni Millers, MD 05/12/2022 2:11 PM  For on call review  [email or web address].

## 2022-05-13 ENCOUNTER — Encounter (HOSPITAL_COMMUNITY): Payer: Self-pay | Admitting: Internal Medicine

## 2022-05-13 ENCOUNTER — Other Ambulatory Visit: Payer: Self-pay

## 2022-05-13 DIAGNOSIS — N184 Chronic kidney disease, stage 4 (severe): Secondary | ICD-10-CM | POA: Diagnosis not present

## 2022-05-13 DIAGNOSIS — E119 Type 2 diabetes mellitus without complications: Secondary | ICD-10-CM | POA: Diagnosis not present

## 2022-05-13 DIAGNOSIS — I10 Essential (primary) hypertension: Secondary | ICD-10-CM | POA: Diagnosis not present

## 2022-05-13 DIAGNOSIS — I5023 Acute on chronic systolic (congestive) heart failure: Secondary | ICD-10-CM | POA: Diagnosis not present

## 2022-05-13 DIAGNOSIS — R569 Unspecified convulsions: Secondary | ICD-10-CM

## 2022-05-13 LAB — BASIC METABOLIC PANEL
Anion gap: 8 (ref 5–15)
BUN: 60 mg/dL — ABNORMAL HIGH (ref 8–23)
CO2: 24 mmol/L (ref 22–32)
Calcium: 8.9 mg/dL (ref 8.9–10.3)
Chloride: 107 mmol/L (ref 98–111)
Creatinine, Ser: 3.26 mg/dL — ABNORMAL HIGH (ref 0.61–1.24)
GFR, Estimated: 19 mL/min — ABNORMAL LOW (ref >60)
Glucose, Bld: 127 mg/dL — ABNORMAL HIGH (ref 70–99)
Potassium: 5 mmol/L (ref 3.5–5.1)
Sodium: 139 mmol/L (ref 135–145)

## 2022-05-13 LAB — GLUCOSE, CAPILLARY
Glucose-Capillary: 236 mg/dL — ABNORMAL HIGH (ref 70–99)
Glucose-Capillary: 151 mg/dL — ABNORMAL HIGH (ref 70–99)

## 2022-05-13 MED ORDER — MELATONIN 3 MG PO TABS
3.0000 mg | ORAL_TABLET | Freq: Every day | ORAL | Status: DC
  Administered 2022-05-13 – 2022-05-14 (×2): 3 mg via ORAL
  Filled 2022-05-13 (×2): qty 1

## 2022-05-13 MED ORDER — IPRATROPIUM-ALBUTEROL 0.5-2.5 (3) MG/3ML IN SOLN
3.0000 mL | Freq: Two times a day (BID) | RESPIRATORY_TRACT | Status: DC
  Administered 2022-05-13 – 2022-05-15 (×5): 3 mL via RESPIRATORY_TRACT
  Filled 2022-05-13 (×5): qty 3

## 2022-05-13 MED ORDER — ISOSORB DINITRATE-HYDRALAZINE 20-37.5 MG PO TABS
2.0000 | ORAL_TABLET | Freq: Three times a day (TID) | ORAL | Status: DC
  Administered 2022-05-13 – 2022-05-15 (×6): 2 via ORAL
  Filled 2022-05-13 (×6): qty 2

## 2022-05-13 NOTE — Plan of Care (Signed)
  Problem: Education: Goal: Knowledge of General Education information will improve Description Including pain rating scale, medication(s)/side effects and non-pharmacologic comfort measures Outcome: Progressing   Problem: Health Behavior/Discharge Planning: Goal: Ability to manage health-related needs will improve Outcome: Progressing   

## 2022-05-13 NOTE — TOC Progression Note (Signed)
Transition of Care [facility]) - Progression Note    Patient Details  Name: DERRON PIPKINS MRN: [id number] Date of Birth: 11/23/1950  Transition of Care [facility]) CM/SW Contact  [name], RN Phone Number: 05/13/2022, 3:29 PM  Clinical Narrative:    Patient is a veteran , NCM informed April with [facility] of admit. And called [place] notification. Auth number for [facility] notification is [id number].    Expected Discharge Plan: [place] Barriers to Discharge: No Barriers Identified  Expected Discharge Plan and Services Expected Discharge Plan: [place] arrangements for the past 2 months: Skilled Nursing Facility                                       Social Determinants of Health (SDOH) Interventions Food Insecurity Interventions: Intervention Not Indicated Housing Interventions: Intervention Not Indicated Transportation Interventions: Intervention Not Indicated  Readmission Risk Interventions    10/01/2020   12:53 PM  Readmission Risk Prevention Plan  Transportation Screening Complete  Medication Review ([place]) Complete  PCP or Specialist appointment within 3-5 days of discharge Complete  HRI or Cross Complete  SW Recovery Care/Counseling Consult Complete  Palliative Care Screening Complete  [place] Patient Refused

## 2022-05-13 NOTE — Progress Notes (Signed)
Progress Note   Patient: Tim Bradley WUX:[id number] DOB: [birth year] DOA: 05/11/2022     1 DOS: the patient was seen and examined on 05/13/2022   Brief hospital course: Tim Bradley was admitted to the hospital with the working diagnosis of decompensated heart failure.  71 yo male with the past medical history of heart failure, CKD stage IV, DVT, hypertension, T2DM and COPD who presented with dyspnea. Patient reported progressive dyspnea and lower extremity edema for a few days. Recent hospitalization for heart failure, 04/24/22 to 04/30/22, discharged to SNF. Apparently furosemide was held at the nursing home.  In the ED his blood pressure was 161/93, HR 67, RR 14 and 02 saturation 93%, lungs with rales and increased work of breathing, heart with S1 and S2 present and rhythmic with no gallops or rubs, abdomen not distended, positive lower extremity edema.   Na 136, K 5,0 cl 107 bicarbonate 24 glucose 103 bun 48 cr 2,60 BNP 480  Wbc 6,8 hgb 10,3 plt 221  Sars covid 19 negative   Chest radiograph with cardiomegaly, bilateral interstitial infiltrates predominant at the lower lobes with cephalization of the vasculature and bilateral hilar congestion.   EKG 63 bpm, left axis deviation, left bundle branch block, sinus rhythm with J point elevation V1 to V4 with no significant ST segment or T wave changes.  Patient was placed on IV furosemide for diuresis. His volume status has improved, but renal function not yet stable. Possible return to SNF in 24 hrs.     Assessment and Plan: Acute on chronic systolic CHF (congestive heart failure) (HCC) Echocardiogram with reduced LV systolic function EF 35 to 40% with global hypokinesis, moderate LVH, RV systolic function preserved. Not able to assess RV pressures.   Urine output is 2000 ml over last 24 hrs Systolic blood pressure is 253 to 664/ diastolic  90 to 403 mmHg.   Plan to continue with carvedilol, hold on furosemide for today. Increase bidil  to 2 tabs tid for after load reduction.  Continue close monitoring of blood pressure.     CKD (chronic kidney disease) stage 4, GFR 15-29 ml/min (HCC) AKI on chronic kidney disease stage IV.   Renal function today with serum cr at 3,26 with K at 5,0 and serum bicarbonate at 24.  Volume status has improved.  Plan to hold on furosemide for today and follow up renal function in am.  Patient need to continue with oral furosemide at the time of his discharge.   Hypertension Uncontrolled hypertension, plan to increase bidil to 2 tabs tid and continue carvedilol. Will hold on furosemide for today.   Diabetes mellitus without complication (HCC) Fasting glucose is 127 mg/dl  today, Plan to continue glucose cover and monitoring with insulin sliding scale.   Chronic deep vein thrombosis (DVT) (HCC) Right lower extremity DVT  Continue anticoagulation with apixaban.  COPD (chronic obstructive pulmonary disease) ([place]) Patient with no wheezing or increased sputum production.  Ruled out acute exacerbation.   Plan to continue bronchodilator therapy and supplemental 02 per [place].   Seizures ([place]) No active seizures, plan to continue with Vimpat and keppra.   Class 2 obesity Calculated BMI is 35,3         Subjective: Patient is feeling better, dyspnea and edema have improved, no chest pain, continue to be very weak and deconditioned. Patient had difficulty sleeping last night and required alprazolam.   Physical Exam: Vitals:   05/13/22 0400 05/13/22 0800 05/13/22 0808 05/13/22 0836  BP: Marland Kitchen)  146/71 (!) 181/101  (!) 168/82  Pulse: 63 78  72  Resp: (!) 21   18  Temp: 97.8 F (36.6 C)   98.4 F (36.9 C)  TempSrc: Oral   Oral  SpO2: 99% 91% 98% 96%  Weight:      Height:       Neurology awake and alert ENT with no pallor Cardiovascular with S1 and S2 present and rhythmic with no gallops or rubs, positive systolic murmur at the right lower sternal border Respiratory with no wheezing  or rales Abdomen not distended Lower extremity has improved, positive non pitting on the right.  Data Reviewed:    Family Communication: I spoke with patient's sister at the bedside, we talked in detail about patient's condition, plan of care and prognosis and all questions were addressed.   Disposition: Status is: Inpatient Remains inpatient appropriate because: AKI possible discharge to SNF in am,   Planned Discharge Destination: Skilled nursing facility     Author: Tawni Millers, MD 05/13/2022 11:11 AM  For on call review [email or web address].

## 2022-05-13 NOTE — Progress Notes (Signed)
[name] at bedside.

## 2022-05-13 NOTE — Progress Notes (Addendum)
Pt restless and awake ALL night did not sleep, AWAKE ALL NIght, calling or yelling for attention. Need med to help sleep, Xanax ineffective. Gave early am dose @ 7255 SRP, RN

## 2022-05-14 DIAGNOSIS — I509 Heart failure, unspecified: Secondary | ICD-10-CM

## 2022-05-14 LAB — GLUCOSE, CAPILLARY
Glucose-Capillary: 133 mg/dL — ABNORMAL HIGH (ref 70–99)
Glucose-Capillary: 147 mg/dL — ABNORMAL HIGH (ref 70–99)
Glucose-Capillary: 127 mg/dL — ABNORMAL HIGH (ref 70–99)

## 2022-05-14 LAB — BASIC METABOLIC PANEL
Anion gap: 7 (ref 5–15)
BUN: 60 mg/dL — ABNORMAL HIGH (ref 8–23)
CO2: 24 mmol/L (ref 22–32)
Calcium: 9.1 mg/dL (ref 8.9–10.3)
Chloride: 107 mmol/L (ref 98–111)
Creatinine, Ser: 3.16 mg/dL — ABNORMAL HIGH (ref 0.61–1.24)
GFR, Estimated: 20 mL/min — ABNORMAL LOW (ref >60)
Glucose, Bld: 127 mg/dL — ABNORMAL HIGH (ref 70–99)
Potassium: 4.7 mmol/L (ref 3.5–5.1)
Sodium: 138 mmol/L (ref 135–145)

## 2022-05-14 MED ORDER — TIZANIDINE HCL 4 MG PO TABS
2.0000 mg | ORAL_TABLET | Freq: Two times a day (BID) | ORAL | Status: DC | PRN
  Administered 2022-05-14 – 2022-05-15 (×3): 2 mg via ORAL
  Filled 2022-05-14 (×3): qty 1

## 2022-05-14 MED ORDER — HYDROCODONE-ACETAMINOPHEN 5-325 MG PO TABS
1.0000 | ORAL_TABLET | Freq: Two times a day (BID) | ORAL | Status: DC | PRN
  Administered 2022-05-14 – 2022-05-15 (×4): 1 via ORAL
  Filled 2022-05-14 (×4): qty 1

## 2022-05-14 MED ORDER — FUROSEMIDE 40 MG PO TABS
40.0000 mg | ORAL_TABLET | Freq: Every day | ORAL | Status: DC
  Administered 2022-05-14 – 2022-05-15 (×2): 40 mg via ORAL
  Filled 2022-05-14 (×2): qty 1

## 2022-05-14 NOTE — Progress Notes (Signed)
Mobility Specialist - Progress Note   05/14/22 1205  Mobility  Activity Transferred from bed to chair  Level of Assistance Moderate assist, patient does 50-74%  Assistive Device Other (Comment) (HHA)  Distance Ambulated (ft) 4 ft  Activity Response Tolerated well  $Mobility charge 1 Mobility   Pt was received in bed and agreeable to mobility. C/o SOB during transfer. Pt returned to chair with all needs met.  Larey Seat

## 2022-05-14 NOTE — Progress Notes (Signed)
PROGRESS NOTE    Tim Bradley  VVO:[id number] DOB: 02-Aug-1951 DOA: 05/11/2022 PCP: [facility]   71 yo male SNF resident, chronically ill with chronic systolic CHF, EF 26%, CKD stage IV, DVT, hypertension, T2DM, bed and wheelchair bound, and COPD who presented with dyspnea. Patient reported progressive dyspnea and lower extremity edema for a few days. Recent hospitalization for heart failure, 04/24/22 to 04/30/22, discharged to SNF. Apparently furosemide was held at the nursing home -Presented to the ED with volume overload, chest x-ray noted bilateral infiltrates, pulmonary vascular congestion and 2+ edema, creatinine was 2.6 -Started on diuretics  Subjective: Feels better, breathing has improved  Assessment and Plan:  Acute on chronic systolic CHF (congestive heart failure) (HCC) -Known cardiomyopathy, EF 35-40% -Recurrent admissions with CHF, further complicated by CKD 4, followed at the [place] -Diuresed with IV Lasix, he is 5.3 L negative -Concern with diuretic compliance per [facility] burn staff, patient declines this -Creatinine 3.1, will restart oral Lasix today, continue carvedilol, BiDil -GDMT limited by CKD 4 -TOC following, discharge planning  CKD (chronic kidney disease) stage 4, GFR 15-29 ml/min (HCC) AKI on chronic kidney disease stage IV.  -Baseline creatinine around 3, creatinine has fluctuated but still close to baseline -Restarting oral Lasix today  Hypertension -Stable, continue Coreg and BiDil  Diabetes mellitus without complication (HCC) -CBGs are stable, continue sliding scale  Chronic deep vein thrombosis (DVT) (HCC) Right lower extremity DVT -Continue apixaban  COPD (chronic obstructive pulmonary disease) (HCC) -Continue bronchodilator therapy, no wheezing  Debility, bed and wheelchair bound -Poor prognosis -Long-term SNF resident  Seizures ([place]) -Stable, continue with Vimpat and keppra.   Class 2 obesity Calculated BMI is 35,3   DVT  prophylaxis: Add heparin subcutaneous Code Status: DNR Family Communication: Discussed with patient detail, no family at bedside Disposition Plan: SNF tomorrow if stable  Consultants:    Procedures:   Antimicrobials:    Objective: Vitals:   05/14/22 0430 05/14/22 0744 05/14/22 0948 05/14/22 1118  BP:   (!) 156/82 125/73  Pulse:   73 75  Resp:   19 20  Temp:   97.8 F (36.6 C) 98.5 F (36.9 C)  TempSrc:   Oral Oral  SpO2:  94% 93% 95%  Weight: 125.5 kg     Height:        Intake/Output Summary (Last 24 hours) at 05/14/2022 1218 Last data filed at 05/14/2022 0900 Gross per 24 hour  Intake 358 ml  Output 1650 ml  Net -1292 ml   Filed Weights   05/12/22 9485 05/13/22 0101 05/14/22 0430  Weight: 124.7 kg 124.1 kg 125.5 kg    Examination:  General exam: Appears calm and comfortable  Respiratory system: Clear to auscultation Cardiovascular system: S1 & S2 heard, RRR.  Abd: nondistended, soft and nontender.Normal bowel sounds heard. Central nervous system: Alert and oriented. No focal neurological deficits. Extremities: no edema Skin: No rashes Psychiatry:  Mood & affect appropriate.     Data Reviewed:   CBC: Recent Labs  Lab 05/11/22 1256  WBC 6.8  NEUTROABS 4.3  HGB 10.3*  HCT 33.3*  MCV 89.8  PLT 462   Basic Metabolic Panel: Recent Labs  Lab 05/11/22 1256 05/12/22 0606 05/13/22 0350 05/14/22 0653  NA 136 138 139 138  K 5.0 4.9 5.0 4.7  CL 107 107 107 107  CO2 24 25 24 24   GLUCOSE 103* 139* 127* 127*  BUN 48* 53* 60* 60*  CREATININE 2.60* 3.03* 3.26* 3.16*  CALCIUM 9.1 9.1  8.9 9.1  MG 2.4  --   --   --    GFR: Estimated Creatinine Clearance: 29.3 mL/min (A) (by C-G formula based on SCr of 3.16 mg/dL (H)). Liver Function Tests: Recent Labs  Lab 05/11/22 1256  AST 14*  ALT 15  ALKPHOS 62  BILITOT 0.4  PROT 6.9  ALBUMIN 3.5   No results for input(s): "LIPASE", "AMYLASE" in the last 168 hours. No results for input(s): "AMMONIA" in  the last 168 hours. Coagulation Profile: No results for input(s): "INR", "PROTIME" in the last 168 hours. Cardiac Enzymes: No results for input(s): "CKTOTAL", "CKMB", "CKMBINDEX", "TROPONINI" in the last 168 hours. BNP (last 3 results) No results for input(s): "PROBNP" in the last 8760 hours. HbA1C: No results for input(s): "HGBA1C" in the last 72 hours. CBG: Recent Labs  Lab 05/12/22 2109 05/13/22 0610 05/13/22 2122 05/14/22 0601 05/14/22 1116  GLUCAP 148* 236* 151* 133* 147*   Lipid Profile: No results for input(s): "CHOL", "HDL", "LDLCALC", "TRIG", "CHOLHDL", "LDLDIRECT" in the last 72 hours. Thyroid Function Tests: No results for input(s): "TSH", "T4TOTAL", "FREET4", "T3FREE", "THYROIDAB" in the last 72 hours. Anemia Panel: No results for input(s): "VITAMINB12", "FOLATE", "FERRITIN", "TIBC", "IRON", "RETICCTPCT" in the last 72 hours. Urine analysis:    Component Value Date/Time   COLORURINE STRAW (A) 04/25/2022 2230   APPEARANCEUR CLEAR 04/25/2022 2230   LABSPEC 1.010 04/25/2022 2230   PHURINE 5.0 04/25/2022 2230   GLUCOSEU NEGATIVE 04/25/2022 2230   HGBUR NEGATIVE 04/25/2022 2230   BILIRUBINUR NEGATIVE 04/25/2022 2230   KETONESUR NEGATIVE 04/25/2022 2230   PROTEINUR 30 (A) 04/25/2022 2230   UROBILINOGEN 0.2 03/11/2012 2012   NITRITE NEGATIVE 04/25/2022 2230   LEUKOCYTESUR NEGATIVE 04/25/2022 2230   Sepsis Labs: @LABRCNTIP (procalcitonin:4,lacticidven:4)  ) Recent Results (from the past 240 hour(s))  Resp Panel by RT-PCR (Flu A&B, Covid) Anterior Nasal Swab     Status: None   Collection Time: 05/11/22 12:39 PM   Specimen: Anterior Nasal Swab  Result Value Ref Range Status   SARS Coronavirus 2 by RT PCR NEGATIVE NEGATIVE Final    Comment: (NOTE) SARS-CoV-2 target nucleic acids are NOT DETECTED.  The SARS-CoV-2 RNA is generally detectable in upper respiratory specimens during the acute phase of infection. The lowest concentration of SARS-CoV-2 viral copies  this assay can detect is 138 copies/mL. A negative result does not preclude SARS-Cov-2 infection and should not be used as the sole basis for treatment or other patient management decisions. A negative result may occur with  improper specimen collection/handling, submission of specimen other than nasopharyngeal swab, presence of viral mutation(s) within the areas targeted by this assay, and inadequate number of viral copies(<138 copies/mL). A negative result must be combined with clinical observations, patient history, and epidemiological information. The expected result is Negative.  Fact Sheet for Patients:  EntrepreneurPulse.com.au  Fact Sheet for Healthcare Providers:  IncredibleEmployment.be  This test is no t yet approved or cleared by the [place] FDA and  has been authorized for detection and/or diagnosis of SARS-CoV-2 by FDA under an Emergency Use Authorization (EUA). This EUA will remain  in effect (meaning this test can be used) for the duration of the COVID-19 declaration under Section 564(b)(1) of the Act, 21 U.S.C.section 360bbb-3(b)(1), unless the authorization is terminated  or revoked sooner.       Influenza A by PCR NEGATIVE NEGATIVE Final   Influenza B by PCR NEGATIVE NEGATIVE Final    Comment: (NOTE) The Xpert Xpress SARS-CoV-2/FLU/RSV plus assay is intended as  an aid in the diagnosis of influenza from Nasopharyngeal swab specimens and should not be used as a sole basis for treatment. Nasal washings and aspirates are unacceptable for Xpert Xpress SARS-CoV-2/FLU/RSV testing.  Fact Sheet for Patients: EntrepreneurPulse.com.au  Fact Sheet for Healthcare Providers: IncredibleEmployment.be  This test is not yet approved or cleared by the [place] FDA and has been authorized for detection and/or diagnosis of SARS-CoV-2 by FDA under an Emergency Use Authorization (EUA). This EUA  will remain in effect (meaning this test can be used) for the duration of the COVID-19 declaration under Section 564(b)(1) of the Act, 21 U.S.C. section 360bbb-3(b)(1), unless the authorization is terminated or revoked.  Performed at [facility], [name] [address], [place], [place] [address]      Radiology Studies: No results found.   Scheduled Meds:  apixaban  5 mg Oral BID   carvedilol  12.5 mg Oral BID   finasteride  5 mg Oral Daily   furosemide  40 mg Oral Daily   ipratropium-albuterol  3 mL Nebulization BID   isosorbide-hydrALAZINE  2 tablet Oral TID   lacosamide  100 mg Oral BID   levETIRAcetam  750 mg Oral BID   magnesium oxide  400 mg Oral Daily   melatonin  3 mg Oral QHS   mometasone-formoterol  2 puff Inhalation BID   sodium chloride flush  3 mL Intravenous Q12H   Continuous Infusions:  sodium chloride       LOS: 2 days    Time spent: 82min  Domenic Polite, MD [facility]   05/14/2022, 12:18 PM

## 2022-05-14 NOTE — Progress Notes (Signed)
TRH night cross cover note:   I was notified by RN of the patient's request for resumption of home Norco and Zanaflex in the setting of chronic neck discomfort.  I reviewed his home medication list, confirming inclusion of both these medications before resumption of both on a prn basis.    Babs Bertin, DO Hospitalist

## 2022-05-14 NOTE — Progress Notes (Signed)
Heart Failure Stewardship Pharmacist Progress Note   PCP: [facility] PCP-Cardiologist: Jenkins Rouge, MD    HPI:  71 yo M with PMH of CHF, NSTEMI, HTN, DVT, seizures, T2DM, OSA, wheelchair bound due to severe spinal stenosis, and prostate cancer.    He was initially diagnosed with HF in 03/2020 when he presented to the hospital complaining of leg edema and weakness. ECHO at that time with EF <20% and RV mildly reduced. He was diuresed and left AMA on 04/13/20. He was discharged with lasix, carvedilol, and BiDil in the setting of AKI.    He presented back to the hospital on 04/21/20 with acute decompensated HF. He stated at this encounter that he had not picked up the medications due to financial reasons. He was diuresed and discharged on the same medications.    He was back at the hospital in 04/2020 with acute CHF and suspected seizure. He underwent stress test at that time. Significant perfusion defect of the entire anterior and inferior wall that is slightly worse at stress. However, does not appear to be severely abnormal, and distribution does not explain global hypokinesis. Likely infarct with small amount of worsening ischemia with stress, but cardiomyopathy out of proportion to ischemia/infarction. Diuresed and discharged with carvedilol, BiDil, and lasix PRN.   Hospitalized in 11/2021 with acute CHF. He ran out of home diuretics. Diuresed and discharged with torsemide, carvedilol, and restarted BiDil. ECHO from OSH in Feb 2023 with EF 40-45%.   Hospitalized from 7/27-8/3 with acute CHF. Diuresed and discharged to SNF with lasix 60 mg daily, carvedilol, and BiDil.  He presented to the ED on 8/13 with edema and shortness of breath. Reported that urine output was decreasing and O2 requirements increasing at SNF. CXR without overt edema.   Current HF Medications: Beta Blocker: carvedilol 12.5 mg BID Other: BiDil 2 tabs TID  Prior to admission HF Medications (per SNF): Diuretic:  furosemide 40 mg daily Beta blocker: carvedilol 12.5 mg BID ** not taking BiDil  Pertinent Lab Values: Serum creatinine 3.16, BUN 60, Potassium 4.7, Sodium 138, BNP 480.5, Magnesium 2.4, A1c 6.9   Vital Signs: Weight: 276 lbs (admission weight: 274 lbs) Blood pressure: 140/80s Heart rate: 60-70s I/O: -1.2L yesterday; net -5.1L  Medication Assistance / Insurance Benefits Check: Does the patient have prescription insurance?  Yes Type of insurance plan: Medicare + [facility]  Outpatient Pharmacy:  Prior to admission outpatient pharmacy: SNF  Assessment: 1. Acute on chronic systolic CHF (LVEF 76-22%), due to NICM. NYHA class II symptoms. - Off IV lasix. Strict I/O. Daily weights. Keep K>4 and Mag>2. Recommend restarting furosemide 60 mg daily at discharge. Daughter states it was stopped at [facility]. Consider restarting today since weight increased.  - Continue carvedilol 12.5 mg BID - No ACE/ARB/ARNI, MRA, or SGLT2i with AKI on advanced CKD (eGFR 20) - Continue BiDil 20/37.5 mg 2 tabs TID   Plan: 1) Medication changes recommended at this time: - Restart PO lasix 60 mg daily  2) Patient assistance: - None needed - SNF at discharge  Kerby Nora, PharmD, BCPS Heart Failure Stewardship Pharmacist Phone [phone]

## 2022-05-15 DIAGNOSIS — I5023 Acute on chronic systolic (congestive) heart failure: Secondary | ICD-10-CM | POA: Diagnosis not present

## 2022-05-15 LAB — BASIC METABOLIC PANEL
Anion gap: 9 (ref 5–15)
BUN: 63 mg/dL — ABNORMAL HIGH (ref 8–23)
CO2: 24 mmol/L (ref 22–32)
Calcium: 9.1 mg/dL (ref 8.9–10.3)
Chloride: 107 mmol/L (ref 98–111)
Creatinine, Ser: 3.39 mg/dL — ABNORMAL HIGH (ref 0.61–1.24)
GFR, Estimated: 18 mL/min — ABNORMAL LOW (ref >60)
Glucose, Bld: 142 mg/dL — ABNORMAL HIGH (ref 70–99)
Potassium: 4.7 mmol/L (ref 3.5–5.1)
Sodium: 140 mmol/L (ref 135–145)

## 2022-05-15 MED ORDER — FINASTERIDE 5 MG PO TABS: 5.0000 mg | ORAL_TABLET | Freq: Every day | ORAL | Status: AC

## 2022-05-15 MED ORDER — HYDROCODONE-ACETAMINOPHEN 5-325 MG PO TABS: 1.0000 | ORAL_TABLET | Freq: Two times a day (BID) | ORAL | 0 refills | Status: DC | PRN

## 2022-05-15 MED ORDER — TIZANIDINE HCL 2 MG PO CAPS: 2.0000 mg | ORAL_CAPSULE | Freq: Two times a day (BID) | ORAL | Status: DC | PRN

## 2022-05-15 MED ORDER — FUROSEMIDE 20 MG PO TABS: 60.0000 mg | ORAL_TABLET | Freq: Every day | ORAL | 0 refills | Status: DC

## 2022-05-15 MED ORDER — ISOSORB DINITRATE-HYDRALAZINE 20-37.5 MG PO TABS: 2.0000 | ORAL_TABLET | Freq: Three times a day (TID) | ORAL | 0 refills | Status: DC

## 2022-05-15 MED ORDER — MELATONIN 3 MG PO TABS: 3.0000 mg | ORAL_TABLET | Freq: Every day | ORAL | 0 refills | Status: DC

## 2022-05-15 NOTE — Progress Notes (Signed)
Mobility Specialist - Progress Note   05/15/22 1140  Mobility  Activity Transferred from chair to bed  Level of Assistance +2 (takes two people)  Assistive Device [employer] Ambulated (ft) 0 ft  Activity Response Tolerated well  $Mobility charge 1 Mobility   Pt was received in chair and agreeable to transfer. No c/o throughout transfer. Pt was retuned to bed with all needs met.    Larey Seat

## 2022-05-15 NOTE — Discharge Summary (Addendum)
Physician Discharge Summary  Tim Bradley ZOX:[id number] DOB: [birth year] DOA: 05/11/2022  [facility]: [facility]  Admit date: 05/11/2022 Discharge date: 05/15/2022  Time spent: 35  minutes  Recommendations for Outpatient Follow-up:  Back to SNF for long-term care, please ensure compliance with diuretics, low-salt diet-recurrent admissions with fluid overload noted Monitor daily weights, give extra lasix 23m for weight gain, 2-3lbs overnight or 5lbs in 1 week, check BMP in 1 week [facility] [place] [facility]   Discharge Diagnoses:  Active Problems:   Acute on chronic systolic CHF (congestive heart failure) (HCC)   CKD (chronic kidney disease) stage 4, GFR 15-29 ml/min (HCC)   Chronic debility, wheelchair/bedbound   Hypertension   Diabetes mellitus without complication (HCC)   Chronic deep vein thrombosis (DVT) (HCC)   COPD (chronic obstructive pulmonary disease) (HCC)   Seizures (HCC)   Class 2 obesity   DNR   Discharge Condition: Improved  Diet recommendation: DM, heart healthy, diabetic Filed Weights   05/14/22 0430 05/15/22 0001 05/15/22 0422  Weight: 125.5 kg 124.3 kg 124 kg    History of present illness:  71yo male SNF resident, chronically ill with chronic systolic CHF, EF 381% CKD stage IV, DVT, hypertension, T2DM, bed and wheelchair bound, and COPD who presented with dyspnea. Patient reported progressive dyspnea and lower extremity edema for a few days. Recent hospitalization for heart failure, 04/24/22 to 04/30/22, discharged to SNF. Apparently furosemide was held at the nursing home -Presented to the ED with volume overload, chest x-ray noted bilateral infiltrates, pulmonary vascular congestion and 2+ edema, creatinine was 2.6  Hospital Course:   Acute on chronic systolic CHF (congestive heart failure) (HCC) -Known cardiomyopathy, EF 35-40%, [place], no cardiac cath in our system, likely secondary to CKD 4 -Recurrent admissions with CHF, further complicated by CKD 4[place] is 6.4 L negative -Concern with diuretic compliance per [place], patient declines this -Creatinine 3.1, restarted oral Lasix 80 Mg daily, continue carvedilol, BiDil -GDMT limited by CKD 4 -Discharge back to SNF for long-term care, needs [facility]  CKD (chronic kidney disease) stage 4, GFR 15-29 ml/min (HCC) AKI on chronic kidney disease stage IV.  -Baseline creatinine around 3, creatinine has fluctuated but still close to baseline -Transition back to Lasix 80 Mg daily, [facility] was advised to [facility] or tomorrow for close follow-up   Hypertension -Stable, continue Coreg and BiDil   Diabetes mellitus without complication ([place] -CBGs are stable, continue sliding scale   Chronic deep vein thrombosis (DVT) (HCC) Right lower extremity DVT -Continue apixaban   COPD (chronic obstructive pulmonary disease) ([place] -Continue bronchodilator therapy, no wheezing   Debility, bed and wheelchair bound -Poor prognosis -Long-term SNF resident   Seizures (HRatt[place] -Stable, continue with Vimpat and keppra.    Class 2 obesity Calculated BMI is 35,3    Code Status: DNR  Discharge Exam: Vitals:   05/15/22 0813 05/15/22 0902  BP: 138/78   Pulse: 70   Resp: 20   Temp: (!) 97.3 F (36.3 C)   SpO2: 96% 100%   General exam: Appears calm and comfortable  Respiratory system: Clear to auscultation Cardiovascular system: S1 & S2 heard, RRR.  Abd: nondistended, soft and nontender.Normal bowel sounds heard. Central  nervous system: Alert and oriented. No focal neurological deficits. Extremities: no edema Skin: No rashes Psychiatry:  Mood & affect appropriate.   Discharge Instructions   Discharge Instructions     Diet  - low sodium heart healthy   Complete by: As directed    Increase activity slowly   Complete by: As directed       Allergies as of 05/15/2022       Reactions   Gabapentin    unknown   Tramadol    unknown   Trazodone And Nefazodone Other (See Comments)   Family reports seizures from this medication        Medication List     STOP taking these medications    bisacodyl 5 MG EC tablet Commonly known as: DULCOLAX   capsaicin 0.025 % cream Commonly known as: ZOSTRIX       TAKE these medications    acetaminophen 500 MG tablet Commonly known as: TYLENOL Take 1,000 mg by mouth 3 (three) times daily as needed for mild pain. What changed: Another medication with the same name was removed. Continue taking this medication, and follow the directions you see here.   apixaban 5 MG Tabs tablet Commonly known as: ELIQUIS Take 1 tablet (5 mg total) by mouth 2 (two) times daily.   atorvastatin 80 MG tablet Commonly known as: LIPITOR Take 1 tablet (80 mg total) by mouth daily.   carvedilol 12.5 MG tablet Commonly known as: Coreg Take 1 tablet (12.5 mg total) by mouth 2 (two) times daily.   finasteride 5 MG tablet Commonly known as: PROSCAR Take 1 tablet (5 mg total) by mouth daily. Start taking on: May 16, 2022   furosemide 20 MG tablet Commonly known as: LASIX Take 4 tablets (80 mg total) by mouth daily. What changed: Another medication with the same name was removed. Continue taking this medication, and follow the directions you see here.   guaiFENesin 100 MG/5ML liquid Commonly known as: ROBITUSSIN Take 10 mLs by mouth every 4 (four) hours as needed for cough or to loosen phlegm. Give 26m (equal to 2030m by mouth every four hours as needed for cough x 72 hours. **up to 240030m4 hours**   HYDROcodone-acetaminophen 5-325 MG tablet Commonly known as: NORCO/VICODIN Take 1 tablet by mouth 2 (two) times daily as needed for moderate pain. 1 tablet by mouth twice every  day as needed x 14 days for breakthrough pain. What changed:  when to take this reasons to take this   ipratropium-albuterol 0.5-2.5 (3) MG/3ML Soln Commonly known as: DUONEB Take 3 mLs by nebulization every 4 (four) hours as needed (shortness of breath/wheezing).   isosorbide-hydrALAZINE 20-37.5 MG tablet Commonly known as: BIDIL Take 2 tablets by mouth 3 (three) times daily. What changed: how much to take   Lacosamide 100 MG Tabs Take 100 mg by mouth 2 (two) times daily.   levETIRAcetam 750 MG tablet Commonly known as: KEPPRA Take 1 tablet (750 mg total) by mouth 2 (two) times daily.   Magnesium Oxide 420 MG Tabs Take 420 mg by mouth daily.   melatonin 3 MG Tabs tablet Take 1 tablet (3 mg total) by mouth at bedtime.   mometasone-formoterol 200-5 MCG/ACT Aero Commonly known as: DULERA Inhale 2 puffs into the lungs 2 (two) times daily.   naloxone 4 MG/0.1ML Liqd nasal spray kit Commonly known as: NARCAN Place 1 spray into the nose as needed (opioid overdose).   QC Vitamin D3 50 MCG (2000 UT) Tabs Generic drug:  Cholecalciferol Take 2,000 Units by mouth daily.   tizanidine 2 MG capsule Commonly known as: ZANAFLEX Take 1 capsule (2 mg total) by mouth 2 (two) times daily as needed for up to 5 days for muscle spasms.       Allergies  Allergen Reactions   Gabapentin     unknown   Tramadol     unknown   Trazodone And Nefazodone Other (See Comments)    Family reports seizures from this medication    Follow-up Information     [facility] Follow up in 12 day(s).   Specialty: Cardiology Why: Hospital follow up PLEASE bring current medication to appointment  FREE valet parking, Entrance C, off [employer] information: [address] [id number] [place] [phone]                 The results of significant diagnostics from this hospitalization (including imaging,  microbiology, ancillary and laboratory) are listed below for reference.    Significant Diagnostic Studies: DG Chest 1 View  Result Date: 05/12/2022 CLINICAL DATA:  CHF. EXAM: CHEST  1 VIEW COMPARISON:  Chest x-rays dated 05/11/2022 and 04/24/2022. FINDINGS: Stable cardiomegaly. Mild central pulmonary vascular congestion and interstitial prominence. No pleural effusion or pneumothorax is seen. Osseous structures about the chest are unremarkable. IMPRESSION: Cardiomegaly with mild central pulmonary vascular congestion and interstitial prominence suggesting mild CHF/volume overload. Electronically Signed   By: Franki Cabot M.D.   On: 05/12/2022 07:43   DG Chest Port 1 View  Result Date: 05/11/2022 CLINICAL DATA:  Shortness of breath EXAM: PORTABLE CHEST 1 VIEW COMPARISON:  April 24, 2022 FINDINGS: Stable cardiomegaly. The hila, mediastinum, and pleura are unchanged. Haziness over the bases is likely due to overlapping soft tissues and portable technique. No suspicious infiltrates or overt edema. The left lung base was not imaged in its entirety. IMPRESSION: No acute abnormalities are identified on this study. Electronically Signed   By: Dorise Bullion III M.D.   On: 05/11/2022 13:33   DG Chest Portable 1 View  Result Date: 04/24/2022 CLINICAL DATA:  Shortness of breath, difficulty breathing EXAM: PORTABLE CHEST 1 VIEW COMPARISON:  04/23/2022 FINDINGS: Lungs are clear.  No pleural effusion or pneumothorax. The heart is top-normal in size.  Thoracic aortic atherosclerosis. IMPRESSION: No evidence of acute cardiopulmonary disease. Electronically Signed   By: Julian Hy M.D.   On: 04/24/2022 21:12   DG Chest Port 1 View  Result Date: 04/23/2022 CLINICAL DATA:  Shortness of breath.  History of CHF. EXAM: PORTABLE CHEST 1 VIEW COMPARISON:  02/19/2022 FINDINGS: The heart is borderline enlarged but stable. Left ventricular configuration. Stable tortuosity and calcification of the thoracic aorta. The  lungs are clear of an acute process. No infiltrates, edema or effusions. No pneumothorax. The bony thorax is intact. IMPRESSION: No acute cardiopulmonary findings. Electronically Signed   By: Marijo Sanes M.D.   On: 04/23/2022 08:52    Microbiology: Recent Results (from the past 240 hour(s))  Resp Panel by RT-PCR (Flu A&B, Covid) Anterior Nasal Swab     Status: None   Collection Time: 05/11/22 12:39 PM   Specimen: Anterior Nasal Swab  Result Value Ref Range Status   SARS Coronavirus 2 by RT PCR NEGATIVE NEGATIVE Final    Comment: (NOTE) SARS-CoV-2 target nucleic acids are NOT DETECTED.  The SARS-CoV-2 RNA is generally detectable in upper respiratory specimens during the acute phase of infection. The lowest concentration of SARS-CoV-2 viral  copies this assay can detect is 138 copies/mL. A negative result does not preclude SARS-Cov-2 infection and should not be used as the sole basis for treatment or other patient management decisions. A negative result may occur with  improper specimen collection/handling, submission of specimen other than nasopharyngeal swab, presence of viral mutation(s) within the areas targeted by this assay, and inadequate number of viral copies(<138 copies/mL). A negative result must be combined with clinical observations, patient history, and epidemiological information. The expected result is Negative.  Fact Sheet for Patients:  EntrepreneurPulse.com.au  Fact Sheet for Healthcare Providers:  IncredibleEmployment.be  This test is no t yet approved or cleared by the [place] FDA and  has been authorized for detection and/or diagnosis of SARS-CoV-2 by FDA under an Emergency Use Authorization (EUA). This EUA will remain  in effect (meaning this test can be used) for the duration of the COVID-19 declaration under Section 564(b)(1) of the Act, 21 U.S.C.section 360bbb-3(b)(1), unless the authorization is terminated  or  revoked sooner.       Influenza A by PCR NEGATIVE NEGATIVE Final   Influenza B by PCR NEGATIVE NEGATIVE Final    Comment: (NOTE) The Xpert Xpress SARS-CoV-2/FLU/RSV plus assay is intended as an aid in the diagnosis of influenza from Nasopharyngeal swab specimens and should not be used as a sole basis for treatment. Nasal washings and aspirates are unacceptable for Xpert Xpress SARS-CoV-2/FLU/RSV testing.  Fact Sheet for Patients: EntrepreneurPulse.com.au  Fact Sheet for Healthcare Providers: IncredibleEmployment.be  This test is not yet approved or cleared by the [place] FDA and has been authorized for detection and/or diagnosis of SARS-CoV-2 by FDA under an Emergency Use Authorization (EUA). This EUA will remain in effect (meaning this test can be used) for the duration of the COVID-19 declaration under Section 564(b)(1) of the Act, 21 U.S.C. section 360bbb-3(b)(1), unless the authorization is terminated or revoked.  Performed at [facility], [place] [address], [place], [place] [address]      Labs: Basic Metabolic Panel: Recent Labs  Lab 05/11/22 1256 05/12/22 0606 05/13/22 0350 05/14/22 0653 05/15/22 0413  NA 136 138 139 138 140  K 5.0 4.9 5.0 4.7 4.7  CL 107 107 107 107 107  CO2 _0 GLUCOSE 103* 139* 127* 127* 142*  BUN 48* 53* 60* 60* 63*  CREATININE 2.60* 3.03* 3.26* 3.16* 3.39*  CALCIUM 9.1 9.1 8.9 9.1 9.1  MG 2.4  --   --   --   --    Liver Function Tests: Recent Labs  Lab 05/11/22 1256  AST 14*  ALT 15  ALKPHOS 62  BILITOT 0.4  PROT 6.9  ALBUMIN 3.5   No results for input(s): "LIPASE", "AMYLASE" in the last 168 hours. No results for input(s): "AMMONIA" in the last 168 hours. CBC: Recent Labs  Lab 05/11/22 1256  WBC 6.8  NEUTROABS 4.3  HGB 10.3*  HCT 33.3*  MCV 89.8  PLT 221   Cardiac Enzymes: No results for input(s): "CKTOTAL", "CKMB", "CKMBINDEX", "TROPONINI" in the last 168  hours. BNP: BNP (last 3 results) Recent Labs    04/23/22 0900 04/24/22 2109 05/11/22 1256  BNP 373.7* 257.2* 480.5*    ProBNP (last 3 results) No results for input(s): "PROBNP" in the last 8760 hours.  CBG: Recent Labs  Lab 05/13/22 0610 05/13/22 2122 05/14/22 0601 05/14/22 1116 05/14/22 1602  GLUCAP 236* 151* 133* 147* 127*       Signed:  Domenic Polite MD.  [facility] 05/15/2022, 11:04 AM

## 2022-05-15 NOTE — Progress Notes (Signed)
Mobility Specialist - Progress Note   05/15/22 1039  Mobility  HOB Elevated/Bed Position Self regulated  Activity Moved into chair position in bed  Range of Motion/Exercises Active  Level of Assistance +2 (takes two people)  Assistive Device [employer] Ambulated (ft) 0 ft  Activity Response Tolerated well  Transport method Ambulatory  $Mobility charge 1 Mobility   Pt received in bed and agreeable to mobility. Pt to recliner after session with all needs met.    [facility]

## 2022-05-15 NOTE — Progress Notes (Signed)
Patient d/c to [facility], report given to Carmin Richmond at the facility. Patient transported by [facility]. Stable for transport on 2L [place].

## 2022-05-15 NOTE — Progress Notes (Signed)
Heart Failure Stewardship Pharmacist Progress Note   PCP: [facility] PCP-Cardiologist: Jenkins Rouge, MD    HPI:  71 yo M with PMH of CHF, NSTEMI, HTN, DVT, seizures, T2DM, OSA, wheelchair bound due to severe spinal stenosis, and prostate cancer.    He was initially diagnosed with HF in 03/2020 when he presented to the hospital complaining of leg edema and weakness. ECHO at that time with EF <20% and RV mildly reduced. He was diuresed and left AMA on 04/13/20. He was discharged with lasix, carvedilol, and BiDil in the setting of AKI.    He presented back to the hospital on 04/21/20 with acute decompensated HF. He stated at this encounter that he had not picked up the medications due to financial reasons. He was diuresed and discharged on the same medications.    He was back at the hospital in 04/2020 with acute CHF and suspected seizure. He underwent stress test at that time. Significant perfusion defect of the entire anterior and inferior wall that is slightly worse at stress. However, does not appear to be severely abnormal, and distribution does not explain global hypokinesis. Likely infarct with small amount of worsening ischemia with stress, but cardiomyopathy out of proportion to ischemia/infarction. Diuresed and discharged with carvedilol, BiDil, and lasix PRN.   Hospitalized in 11/2021 with acute CHF. He ran out of home diuretics. Diuresed and discharged with torsemide, carvedilol, and restarted BiDil. ECHO from OSH in Feb 2023 with EF 40-45%.   Hospitalized from 7/27-8/3 with acute CHF. Diuresed and discharged to SNF with lasix 60 mg daily, carvedilol, and BiDil.  He presented to the ED on 8/13 with edema and shortness of breath. Reported that urine output was decreasing and O2 requirements increasing at SNF. CXR without overt edema.   Discharge HF Medications: Diuretic: furosemide 60 mg daily + PRN for weight gain Beta Blocker: carvedilol 12.5 mg BID Other: BiDil 2 tabs  TID  Prior to admission HF Medications (per SNF): Diuretic: furosemide 40 mg daily Beta blocker: carvedilol 12.5 mg BID ** not taking BiDil  Pertinent Lab Values: Serum creatinine 3.39, BUN 63, Potassium 4.7, Sodium 140, BNP 480.5, Magnesium 2.4, A1c 6.9   Vital Signs: Weight: 273 lbs (admission weight: 274 lbs) Blood pressure: 140/80s Heart rate: 60s I/O: -1.6L yesterday; net -6.2L  Medication Assistance / Insurance Benefits Check: Does the patient have prescription insurance?  Yes Type of insurance plan: Medicare + [facility]  Outpatient Pharmacy:  Prior to admission outpatient pharmacy: SNF  Assessment: 1. Acute on chronic systolic CHF (LVEF 61-90%), due to NICM. NYHA class II symptoms. - Agree with restarting furosemide 60 mg daily at discharge. Adding PRN directions for weight gain. - Continue carvedilol 12.5 mg BID - No ACE/ARB/ARNI, MRA, or SGLT2i with AKI on advanced CKD (eGFR <20) - Continue BiDil 20/37.5 mg 2 tabs TID   Plan: 1) Medication changes recommended at this time: - Discharge today  2) Patient assistance: - None needed - SNF at discharge  Kerby Nora, PharmD, BCPS Heart Failure Stewardship Pharmacist Phone [phone]

## 2022-05-15 NOTE — TOC Transition Note (Signed)
Transition of Care [facility]) - CM/SW Discharge Note   Patient Details  Name: Tim Bradley MRN: [id number] Date of Birth: [birth year]  Transition of Care [facility]) CM/SW Contact:  Bethann Berkshire, [place] Phone Number: 05/15/2022, 12:03 PM   Clinical Narrative:     Patient will DC to: [name] Anticipated DC date: 05/15/22 Family notified: Sister  [name] Transport by: [name]   Per MD patient ready for DC to [place]. RN, patient, patient's family, and facility notified of DC. Discharge Summary and FL2 sent to facility. RN to call report prior to discharge ([phone] Room [phone]). DC packet on chart. Ambulance transport requested for patient.   CSW will sign off for now as social work intervention is no longer needed. Please consult [place] again if new needs arise.   Final next level of care: Skilled Nursing Facility Barriers to Discharge: No Barriers Identified   Patient Goals and CMS Choice        Discharge Placement              Patient chooses bed at: [name] Patient to be transferred to facility by: [place] Name of family member notified: [name] (Sister) [phone] [facility]) Patient and family notified of of transfer: 05/15/22  Discharge Plan and Services                                     Social Determinants of Health (SDOH) Interventions Food Insecurity Interventions: Intervention Not Indicated Housing Interventions: Intervention Not Indicated Transportation Interventions: Intervention Not Indicated   Readmission Risk Interventions    10/01/2020   12:53 PM  Readmission Risk Prevention Plan  Transportation Screening Complete  Medication Review ([place]) Complete  PCP or Specialist appointment within 3-5 days of discharge Complete  HRI or [place] Complete  SW Recovery Care/Counseling Consult Complete  Palliative Care Screening Complete  [place] Patient Refused

## 2022-05-17 ENCOUNTER — Inpatient Hospital Stay (HOSPITAL_COMMUNITY)
Admission: EM | Admit: 2022-05-17 | Discharge: 2022-05-26 | DRG: 291 | Disposition: A | Discharge status: Skilled Nursing Facility | Payer: No Typology Code available for payment source | Source: Skilled Nursing Facility | Attending: Internal Medicine | Admitting: Internal Medicine

## 2022-05-17 ENCOUNTER — Emergency Department (HOSPITAL_COMMUNITY): Payer: No Typology Code available for payment source

## 2022-05-17 ENCOUNTER — Inpatient Hospital Stay (HOSPITAL_COMMUNITY): Payer: No Typology Code available for payment source

## 2022-05-17 ENCOUNTER — Other Ambulatory Visit: Payer: Self-pay

## 2022-05-17 ENCOUNTER — Encounter (HOSPITAL_COMMUNITY): Payer: Self-pay

## 2022-05-17 DIAGNOSIS — J9600 Acute respiratory failure, unspecified whether with hypoxia or hypercapnia: Secondary | ICD-10-CM

## 2022-05-17 DIAGNOSIS — R5381 Other malaise: Secondary | ICD-10-CM | POA: Diagnosis present

## 2022-05-17 DIAGNOSIS — N390 Urinary tract infection, site not specified: Secondary | ICD-10-CM | POA: Diagnosis present

## 2022-05-17 DIAGNOSIS — J449 Chronic obstructive pulmonary disease, unspecified: Secondary | ICD-10-CM | POA: Diagnosis present

## 2022-05-17 DIAGNOSIS — I160 Hypertensive urgency: Secondary | ICD-10-CM | POA: Diagnosis present

## 2022-05-17 DIAGNOSIS — G40909 Epilepsy, unspecified, not intractable, without status epilepticus: Secondary | ICD-10-CM | POA: Diagnosis present

## 2022-05-17 DIAGNOSIS — I447 Left bundle-branch block, unspecified: Secondary | ICD-10-CM | POA: Diagnosis present

## 2022-05-17 DIAGNOSIS — Z8546 Personal history of malignant neoplasm of prostate: Secondary | ICD-10-CM | POA: Diagnosis not present

## 2022-05-17 DIAGNOSIS — I82501 Chronic embolism and thrombosis of unspecified deep veins of right lower extremity: Secondary | ICD-10-CM | POA: Diagnosis present

## 2022-05-17 DIAGNOSIS — D631 Anemia in chronic kidney disease: Secondary | ICD-10-CM | POA: Diagnosis present

## 2022-05-17 DIAGNOSIS — Z794 Long term (current) use of insulin: Secondary | ICD-10-CM

## 2022-05-17 DIAGNOSIS — I130 Hypertensive heart and chronic kidney disease with heart failure and stage 1 through stage 4 chronic kidney disease, or unspecified chronic kidney disease: Principal | ICD-10-CM | POA: Diagnosis present

## 2022-05-17 DIAGNOSIS — E1169 Type 2 diabetes mellitus with other specified complication: Secondary | ICD-10-CM | POA: Diagnosis present

## 2022-05-17 DIAGNOSIS — E669 Obesity, unspecified: Secondary | ICD-10-CM | POA: Diagnosis present

## 2022-05-17 DIAGNOSIS — K5900 Constipation, unspecified: Secondary | ICD-10-CM | POA: Diagnosis present

## 2022-05-17 DIAGNOSIS — I429 Cardiomyopathy, unspecified: Secondary | ICD-10-CM | POA: Diagnosis present

## 2022-05-17 DIAGNOSIS — J9601 Acute respiratory failure with hypoxia: Secondary | ICD-10-CM | POA: Diagnosis present

## 2022-05-17 DIAGNOSIS — Z87891 Personal history of nicotine dependence: Secondary | ICD-10-CM

## 2022-05-17 DIAGNOSIS — N3000 Acute cystitis without hematuria: Secondary | ICD-10-CM | POA: Diagnosis not present

## 2022-05-17 DIAGNOSIS — E1122 Type 2 diabetes mellitus with diabetic chronic kidney disease: Secondary | ICD-10-CM | POA: Diagnosis present

## 2022-05-17 DIAGNOSIS — Z8249 Family history of ischemic heart disease and other diseases of the circulatory system: Secondary | ICD-10-CM

## 2022-05-17 DIAGNOSIS — E871 Hypo-osmolality and hyponatremia: Secondary | ICD-10-CM | POA: Diagnosis present

## 2022-05-17 DIAGNOSIS — I825Y1 Chronic embolism and thrombosis of unspecified deep veins of right proximal lower extremity: Secondary | ICD-10-CM

## 2022-05-17 DIAGNOSIS — N400 Enlarged prostate without lower urinary tract symptoms: Secondary | ICD-10-CM | POA: Diagnosis present

## 2022-05-17 DIAGNOSIS — F419 Anxiety disorder, unspecified: Secondary | ICD-10-CM | POA: Diagnosis present

## 2022-05-17 DIAGNOSIS — E785 Hyperlipidemia, unspecified: Secondary | ICD-10-CM | POA: Diagnosis present

## 2022-05-17 DIAGNOSIS — T502X5A Adverse effect of carbonic-anhydrase inhibitors, benzothiadiazides and other diuretics, initial encounter: Secondary | ICD-10-CM | POA: Diagnosis present

## 2022-05-17 DIAGNOSIS — E875 Hyperkalemia: Secondary | ICD-10-CM | POA: Diagnosis present

## 2022-05-17 DIAGNOSIS — I509 Heart failure, unspecified: Principal | ICD-10-CM

## 2022-05-17 DIAGNOSIS — N179 Acute kidney failure, unspecified: Secondary | ICD-10-CM | POA: Diagnosis present

## 2022-05-17 DIAGNOSIS — Z7901 Long term (current) use of anticoagulants: Secondary | ICD-10-CM | POA: Diagnosis not present

## 2022-05-17 DIAGNOSIS — I82409 Acute embolism and thrombosis of unspecified deep veins of unspecified lower extremity: Secondary | ICD-10-CM | POA: Diagnosis present

## 2022-05-17 DIAGNOSIS — Z6835 Body mass index (BMI) 35.0-35.9, adult: Secondary | ICD-10-CM | POA: Diagnosis not present

## 2022-05-17 DIAGNOSIS — Z66 Do not resuscitate: Secondary | ICD-10-CM | POA: Diagnosis present

## 2022-05-17 DIAGNOSIS — Z79890 Hormone replacement therapy (postmenopausal): Secondary | ICD-10-CM

## 2022-05-17 DIAGNOSIS — M549 Dorsalgia, unspecified: Secondary | ICD-10-CM | POA: Diagnosis present

## 2022-05-17 DIAGNOSIS — N184 Chronic kidney disease, stage 4 (severe): Secondary | ICD-10-CM | POA: Diagnosis present

## 2022-05-17 DIAGNOSIS — Z885 Allergy status to narcotic agent status: Secondary | ICD-10-CM

## 2022-05-17 DIAGNOSIS — D638 Anemia in other chronic diseases classified elsewhere: Secondary | ICD-10-CM | POA: Diagnosis not present

## 2022-05-17 DIAGNOSIS — Z993 Dependence on wheelchair: Secondary | ICD-10-CM

## 2022-05-17 DIAGNOSIS — Z888 Allergy status to other drugs, medicaments and biological substances status: Secondary | ICD-10-CM

## 2022-05-17 DIAGNOSIS — I5043 Acute on chronic combined systolic (congestive) and diastolic (congestive) heart failure: Secondary | ICD-10-CM | POA: Diagnosis present

## 2022-05-17 DIAGNOSIS — K219 Gastro-esophageal reflux disease without esophagitis: Secondary | ICD-10-CM | POA: Diagnosis present

## 2022-05-17 DIAGNOSIS — Z79899 Other long term (current) drug therapy: Secondary | ICD-10-CM

## 2022-05-17 DIAGNOSIS — Z7401 Bed confinement status: Secondary | ICD-10-CM

## 2022-05-17 DIAGNOSIS — E66812 Obesity, class 2: Secondary | ICD-10-CM | POA: Diagnosis present

## 2022-05-17 LAB — RETICULOCYTES
Immature Retic Fract: 6.6 % (ref 2.3–15.9)
RBC.: 3.66 MIL/uL — ABNORMAL LOW (ref 4.22–5.81)
Retic Count, Absolute: 65.1 10*3/uL (ref 19.0–186.0)
Retic Ct Pct: 1.8 % (ref 0.4–3.1)

## 2022-05-17 LAB — URINALYSIS, COMPLETE (UACMP) WITH MICROSCOPIC
Bilirubin Urine: NEGATIVE
Glucose, UA: NEGATIVE mg/dL
Ketones, ur: NEGATIVE mg/dL
Nitrite: NEGATIVE
Protein, ur: 100 mg/dL — AB
Specific Gravity, Urine: 1.015 (ref 1.005–1.030)
WBC, UA: >50 WBC/hpf (ref 0–5)
pH: 6 (ref 5.0–8.0)

## 2022-05-17 LAB — I-STAT VENOUS BLOOD GAS, ED
Acid-Base Excess: 0 mmol/L (ref 0.0–2.0)
Bicarbonate: 27.1 mmol/L (ref 20.0–28.0)
Calcium, Ion: 1.19 mmol/L (ref 1.15–1.40)
HCT: 33 % — ABNORMAL LOW (ref 39.0–52.0)
Hemoglobin: 11.2 g/dL — ABNORMAL LOW (ref 13.0–17.0)
O2 Saturation: 75 %
Potassium: 6.2 mmol/L — ABNORMAL HIGH (ref 3.5–5.1)
Sodium: 136 mmol/L (ref 135–145)
TCO2: 29 mmol/L (ref 22–32)
pCO2, Ven: 53.8 mmHg (ref 44–60)
pH, Ven: 7.31 (ref 7.25–7.43)
pO2, Ven: 45 mmHg (ref 32–45)

## 2022-05-17 LAB — CBC WITH DIFFERENTIAL/PLATELET
Abs Immature Granulocytes: 0.02 10*3/uL (ref 0.00–0.07)
Basophils Absolute: 0 10*3/uL (ref 0.0–0.1)
Basophils Relative: 1 %
Eosinophils Absolute: 0.2 10*3/uL (ref 0.0–0.5)
Eosinophils Relative: 4 %
HCT: 34.2 % — ABNORMAL LOW (ref 39.0–52.0)
Hemoglobin: 10.5 g/dL — ABNORMAL LOW (ref 13.0–17.0)
Immature Granulocytes: 0 %
Lymphocytes Relative: 25 %
Lymphs Abs: 1.3 10*3/uL (ref 0.7–4.0)
MCH: 28 pg (ref 26.0–34.0)
MCHC: 30.7 g/dL (ref 30.0–36.0)
MCV: 91.2 fL (ref 80.0–100.0)
Monocytes Absolute: 0.7 10*3/uL (ref 0.1–1.0)
Monocytes Relative: 15 %
Neutro Abs: 2.7 10*3/uL (ref 1.7–7.7)
Neutrophils Relative %: 55 %
Platelets: 289 10*3/uL (ref 150–400)
RBC: 3.75 MIL/uL — ABNORMAL LOW (ref 4.22–5.81)
RDW: 14.8 % (ref 11.5–15.5)
WBC: 5 10*3/uL (ref 4.0–10.5)
nRBC: 0 % (ref 0.0–0.2)

## 2022-05-17 LAB — BASIC METABOLIC PANEL
Anion gap: 9 (ref 5–15)
BUN: 68 mg/dL — ABNORMAL HIGH (ref 8–23)
CO2: 23 mmol/L (ref 22–32)
Calcium: 9.1 mg/dL (ref 8.9–10.3)
Chloride: 104 mmol/L (ref 98–111)
Creatinine, Ser: 3.46 mg/dL — ABNORMAL HIGH (ref 0.61–1.24)
GFR, Estimated: 18 mL/min — ABNORMAL LOW (ref >60)
Glucose, Bld: 116 mg/dL — ABNORMAL HIGH (ref 70–99)
Potassium: 5.3 mmol/L — ABNORMAL HIGH (ref 3.5–5.1)
Sodium: 136 mmol/L (ref 135–145)

## 2022-05-17 LAB — TSH: TSH: 0.761 u[IU]/mL (ref 0.350–4.500)

## 2022-05-17 LAB — SODIUM, URINE, RANDOM: Sodium, Ur: 85 mmol/L

## 2022-05-17 LAB — TROPONIN I (HIGH SENSITIVITY)
Troponin I (High Sensitivity): 14 ng/L (ref <18)
Troponin I (High Sensitivity): 13 ng/L (ref <18)

## 2022-05-17 LAB — BRAIN NATRIURETIC PEPTIDE: B Natriuretic Peptide: 132.8 pg/mL — ABNORMAL HIGH (ref 0.0–100.0)

## 2022-05-17 LAB — OSMOLALITY, URINE: Osmolality, Ur: 376 mOsm/kg (ref 300–900)

## 2022-05-17 LAB — CREATININE, URINE, RANDOM: Creatinine, Urine: 55 mg/dL

## 2022-05-17 MED ORDER — MOMETASONE FURO-FORMOTEROL FUM 200-5 MCG/ACT IN AERO
2.0000 | INHALATION_SPRAY | Freq: Two times a day (BID) | RESPIRATORY_TRACT | Status: DC
  Administered 2022-05-19 – 2022-05-25 (×13): 2 via RESPIRATORY_TRACT
  Filled 2022-05-17 (×3): qty 8.8

## 2022-05-17 MED ORDER — SODIUM CHLORIDE 0.9 % IV SOLN: 250.0000 mL | INTRAVENOUS | Status: DC | PRN

## 2022-05-17 MED ORDER — SODIUM CHLORIDE 0.9% FLUSH
3.0000 mL | INTRAVENOUS | Status: DC | PRN
  Administered 2022-05-20: 3 mL via INTRAVENOUS

## 2022-05-17 MED ORDER — APIXABAN 5 MG PO TABS
5.0000 mg | ORAL_TABLET | Freq: Two times a day (BID) | ORAL | Status: DC
  Administered 2022-05-18 – 2022-05-26 (×19): 5 mg via ORAL
  Filled 2022-05-17 (×19): qty 1

## 2022-05-17 MED ORDER — FUROSEMIDE 10 MG/ML IJ SOLN
60.0000 mg | Freq: Two times a day (BID) | INTRAMUSCULAR | Status: DC
  Administered 2022-05-18: 60 mg via INTRAVENOUS
  Filled 2022-05-17: qty 6

## 2022-05-17 MED ORDER — SODIUM CHLORIDE 0.9% FLUSH
3.0000 mL | Freq: Two times a day (BID) | INTRAVENOUS | Status: DC
  Administered 2022-05-18 – 2022-05-25 (×16): 3 mL via INTRAVENOUS

## 2022-05-17 MED ORDER — POLYETHYLENE GLYCOL 3350 17 G PO PACK
17.0000 g | PACK | Freq: Every day | ORAL | Status: DC | PRN
  Administered 2022-05-18: 17 g via ORAL
  Filled 2022-05-17 (×2): qty 1

## 2022-05-17 MED ORDER — ACETAMINOPHEN 650 MG RE SUPP: 650.0000 mg | Freq: Four times a day (QID) | RECTAL | Status: DC | PRN

## 2022-05-17 MED ORDER — SODIUM CHLORIDE 0.9 % IV SOLN
100.0000 mg | Freq: Two times a day (BID) | INTRAVENOUS | Status: DC
  Administered 2022-05-18: 100 mg via INTRAVENOUS
  Filled 2022-05-17 (×3): qty 10

## 2022-05-17 MED ORDER — SODIUM CHLORIDE 0.9 % IV SOLN
750.0000 mg | Freq: Two times a day (BID) | INTRAVENOUS | Status: DC
  Administered 2022-05-18: 750 mg via INTRAVENOUS
  Filled 2022-05-17 (×2): qty 7.5

## 2022-05-17 MED ORDER — BISACODYL 10 MG RE SUPP: 10.0000 mg | Freq: Every day | RECTAL | Status: DC | PRN

## 2022-05-17 MED ORDER — FUROSEMIDE 10 MG/ML IJ SOLN
40.0000 mg | Freq: Once | INTRAMUSCULAR | Status: AC
  Administered 2022-05-18: 40 mg via INTRAVENOUS
  Filled 2022-05-17: qty 4

## 2022-05-17 MED ORDER — INSULIN ASPART 100 UNIT/ML IJ SOLN
0.0000 [IU] | INTRAMUSCULAR | Status: DC
  Administered 2022-05-18: 1 [IU] via SUBCUTANEOUS
  Administered 2022-05-18 (×4): 2 [IU] via SUBCUTANEOUS
  Administered 2022-05-19: 1 [IU] via SUBCUTANEOUS
  Administered 2022-05-19: 3 [IU] via SUBCUTANEOUS
  Administered 2022-05-19: 1 [IU] via SUBCUTANEOUS
  Administered 2022-05-20 (×2): 2 [IU] via SUBCUTANEOUS
  Administered 2022-05-20 (×2): 1 [IU] via SUBCUTANEOUS
  Administered 2022-05-20: 3 [IU] via SUBCUTANEOUS
  Administered 2022-05-21: 2 [IU] via SUBCUTANEOUS
  Administered 2022-05-21: 3 [IU] via SUBCUTANEOUS
  Administered 2022-05-21: 1 [IU] via SUBCUTANEOUS
  Administered 2022-05-21: 5 [IU] via SUBCUTANEOUS
  Administered 2022-05-21: 2 [IU] via SUBCUTANEOUS
  Administered 2022-05-21: 1 [IU] via SUBCUTANEOUS
  Administered 2022-05-22 (×2): 2 [IU] via SUBCUTANEOUS
  Administered 2022-05-22: 1 [IU] via SUBCUTANEOUS
  Administered 2022-05-22 – 2022-05-23 (×6): 2 [IU] via SUBCUTANEOUS
  Administered 2022-05-24 (×2): 1 [IU] via SUBCUTANEOUS
  Administered 2022-05-24: 2 [IU] via SUBCUTANEOUS
  Administered 2022-05-24: 1 [IU] via SUBCUTANEOUS
  Administered 2022-05-24 – 2022-05-25 (×3): 2 [IU] via SUBCUTANEOUS
  Administered 2022-05-25: 3 [IU] via SUBCUTANEOUS
  Administered 2022-05-25 (×3): 2 [IU] via SUBCUTANEOUS
  Administered 2022-05-25 – 2022-05-26 (×3): 3 [IU] via SUBCUTANEOUS
  Administered 2022-05-26: 2 [IU] via SUBCUTANEOUS
  Administered 2022-05-26 (×2): 3 [IU] via SUBCUTANEOUS
  Administered 2022-05-26: 1 [IU] via SUBCUTANEOUS

## 2022-05-17 MED ORDER — ACETAMINOPHEN 325 MG PO TABS
650.0000 mg | ORAL_TABLET | Freq: Four times a day (QID) | ORAL | Status: DC | PRN
  Administered 2022-05-18 – 2022-05-25 (×11): 650 mg via ORAL
  Filled 2022-05-17 (×11): qty 2

## 2022-05-17 MED ORDER — FUROSEMIDE 10 MG/ML IJ SOLN
40.0000 mg | Freq: Once | INTRAMUSCULAR | Status: AC
  Administered 2022-05-17: 40 mg via INTRAVENOUS
  Filled 2022-05-17: qty 4

## 2022-05-17 MED ORDER — IPRATROPIUM-ALBUTEROL 0.5-2.5 (3) MG/3ML IN SOLN
3.0000 mL | RESPIRATORY_TRACT | Status: DC | PRN
  Administered 2022-05-20: 3 mL via RESPIRATORY_TRACT
  Filled 2022-05-17: qty 3

## 2022-05-17 MED ORDER — FINASTERIDE 5 MG PO TABS
5.0000 mg | ORAL_TABLET | Freq: Every day | ORAL | Status: DC
  Administered 2022-05-18 – 2022-05-26 (×9): 5 mg via ORAL
  Filled 2022-05-17 (×9): qty 1

## 2022-05-17 MED ORDER — CARVEDILOL 12.5 MG PO TABS
12.5000 mg | ORAL_TABLET | Freq: Two times a day (BID) | ORAL | Status: DC
  Administered 2022-05-18 – 2022-05-26 (×18): 12.5 mg via ORAL
  Filled 2022-05-17 (×18): qty 1

## 2022-05-17 NOTE — Assessment & Plan Note (Addendum)
-   Pt diagnosed with CHF based on presence of the following  rales on exam  cardiomegaly, Pulmonary edema on CXR, and  bilateral leg edema, DOE,  With some  response to IV diuretic in ER will give additional 40mg  IV for total of 80 mg  admit on telemetry,  cycle cardiac enzymes,   obtain serial ECG  to evaluate for ischemia as a cause of heart failure  monitor daily weight:  Filed Weights   05/17/22 2053  Weight: 122.9 kg   Last BNP BNP (last 3 results) Recent Labs    04/24/22 2109 05/11/22 1256 05/17/22 2050  BNP 257.2* 480.5* 132.8*      diurese with IV lasix and monitor orthostatics and creatinine to avoid over diuresis.  Order echogram to evaluate EF and valves  ACE/ARBi  Contraindicated  Would benefit from cardiology consult in AM

## 2022-05-17 NOTE — Assessment & Plan Note (Signed)
Order sliding scale check hemoglobin A1c and TSH 

## 2022-05-17 NOTE — ED Triage Notes (Signed)
Pt BIB EMS from [place]. EMS called out for SOB that has worsened throughout the day. Pt is swollen in abdomen and legs. Pt was seen last week for same. EMS reports pt O2 was 90% on room air, initially started on facemask but pt still with increased WOB. EMS started on cpap - Pt breathing WOB improved and O2 99%. Pt reports no CP but abdominal pain/tightness.  Initial BP 210/120 - EMS gave 3 nitro - last BP 165/87 HR 72 - NSR with baseline LBBB Pt diminished in all fields  CBG 153

## 2022-05-17 NOTE — Assessment & Plan Note (Addendum)
Restart lacosamide 100 mg p.o. twice a day Keppra. at 750 mg twice daily change to IV in case unable to tolerate p.o

## 2022-05-17 NOTE — Assessment & Plan Note (Signed)
Ordered bowel regimen

## 2022-05-17 NOTE — Assessment & Plan Note (Signed)
Mild no ECG changes repeat after IV lasix administration

## 2022-05-17 NOTE — Subjective & Objective (Signed)
Comes in from [place] [facility] worsening shortness of breath Increase swelling of abdomen and legs satting 90% on room air EMS had to start on the CPAP Patient also endorses abdominal pain and tightness but no chest pain On arrival BP 210/120 given 3 nitro's and blood pressure improved to 165/87 heart rate 72   Of note patient resides at at [facility] was just discharged there on 17 August.  At baseline wheelchair-bound was just recently hospitalized for heart failure first time in end of July then again last week felt to be secondary to SNF discontinuing diuretics Patient with known CHF EF 35-40% During the last admission was diuresed with IV Lasix 6.4 L restarted on 40 mg p.o. at the time of discharge Patient has known history of DVT taking Xarelto

## 2022-05-17 NOTE — H&P (Signed)
ADAR RASE OEV:[id number] DOB: May 18, 1951 DOA: 05/17/2022     PCP: [facility]   Outpatient Specialists:     NEurology  Dr. Hortense Ramal   Urology Dr.Hemal, Tacey Ruiz, MD  Patient arrived to ER on 05/17/22 at 2041 Referred by Attending [name], MD   Patient coming from:     From facility [facility]  Chief Complaint:   Chief Complaint  Patient presents with   Shortness of Breath    On cpap    HPI: Tim Bradley is a 71 y.o. male with medical history significant of CHF, EF 35%, CKD stage IV, DVT, hypertension, T2DM, bed and wheelchair bound, and COPD, Malignant neoplasm prostate     Presented with   SOB Comes in from [facility] worsening shortness of breath Increase swelling of abdomen and legs satting 90% on room air EMS had to start on the CPAP Patient also endorses abdominal pain and tightness but no chest pain On arrival BP 210/120 given 3 nitro's and blood pressure improved to 165/87 heart rate 72   Of note patient resides at at [facility] was just discharged there on 17 August.  At baseline wheelchair-bound was just recently hospitalized for heart failure first time in end of July then again last week felt to be secondary to SNF discontinuing diuretics Patient with known CHF EF 35-40% During the last admission was diuresed with IV Lasix 6.4 L restarted on 40 mg p.o. at the time of discharge Patient has known history of DVT taking Xarelto      Regarding pertinent Chronic problems:       HTN on BiDil, Coreg   chronic CHF diastolic/systolic/ combined - last echo April 2023 cardiomyopathy, EF 35-40%       DM 2 -  Lab Results  Component Value Date   HGBA1C 6.9 (H) 04/26/2022   diet controlled    History of seizure disorder on Keppra and Vimpat        COPD -   not  on baseline oxygen       Hx of DVT/PE on - anticoagulation with  eliquis   CKD stage 4  baseline Cr 3.0 Estimated Creatinine Clearance: 26.5 mL/min (A) (by C-G formula based on  SCr of 3.46 mg/dL (H)).  Lab Results  Component Value Date   CREATININE 3.46 (H) 05/17/2022   CREATININE 3.39 (H) 05/15/2022   CREATININE 3.16 (H) 05/14/2022   History of prostate cancer followed by urology   BPH - on Proscar      Chronic anemia - baseline hg Hemoglobin & Hematocrit  Recent Labs    04/25/22 0542 05/11/22 1256 05/17/22 2050  HGB 11.4* 10.3* 10.5*     While in ER:     Needing BiPAP Appears to be fluid overloaded Given a dose of Lasix 40 mg IV    CXR -  CHF    Following Medications were ordered in ER: Medications  furosemide (LASIX) injection 40 mg (40 mg Intravenous Given 05/17/22 2056)    ED Triage Vitals  Enc Vitals Group     BP 05/17/22 2053 (!) 161/101     Pulse Rate 05/17/22 2045 71     Resp 05/17/22 2045 (!) 26     Temp 05/17/22 2053 97.9 F (36.6 C)     Temp Source 05/17/22 2053 Axillary     SpO2 05/17/22 2045 99 %     Weight 05/17/22 2053 271 lb (122.9 kg)     Height 05/17/22 2053  6\' 2"  (1.88 m)     Head Circumference --      Peak Flow --      Pain Score 05/17/22 2053 6     Pain Loc --      Pain Edu? --      Excl. in [place]? --   TMAX(24)@     _________________________________________ Significant initial  Findings: Abnormal Labs Reviewed  BASIC METABOLIC PANEL - Abnormal; Notable for the following components:      Result Value   Potassium 5.3 (*)    Glucose, Bld 116 (*)    BUN 68 (*)    Creatinine, Ser 3.46 (*)    GFR, Estimated 18 (*)    All other components within normal limits  CBC WITH DIFFERENTIAL/PLATELET - Abnormal; Notable for the following components:   RBC 3.75 (*)    Hemoglobin 10.5 (*)    HCT 34.2 (*)    All other components within normal limits  BRAIN NATRIURETIC PEPTIDE - Abnormal; Notable for the following components:   B Natriuretic Peptide 132.8 (*)    All other components within normal limits    _________________________ Troponin 14 ECG: Ordered Personally reviewed and interpreted by me showing: HR :  71 Rhythm:  Sinus rhythm Short PR interval Left atrial enlargement Left bundle branch block QTC 507    The recent clinical data is shown below. Vitals:   05/17/22 2045 05/17/22 2053 05/17/22 2115 05/17/22 2145  BP:  (!) 161/101 (!) 137/95 (!) 150/99  Pulse: 71 71 68 66  Resp: (!) 26 (!) 23 14 17   Temp:  97.9 F (36.6 C)    TempSrc:  Axillary    SpO2: 99% 99% 99% 98%  Weight:  122.9 kg    Height:  6\' 2"  (1.88 m)      WBC     Component Value Date/Time   WBC 5.0 05/17/2022 2050   LYMPHSABS 1.3 05/17/2022 2050   MONOABS 0.7 05/17/2022 2050   EOSABS 0.2 05/17/2022 2050   BASOSABS 0.0 05/17/2022 2050        UA  ordered   Showing UTI _______________________________________________ Hospitalist was called for admission for   Acute on chronic congestive heart failure,   Acute respiratory failure, unspecified whether with hypoxia       The following Work up has been ordered so far:  Orders Placed This Encounter  Procedures   DG Chest [employer] 1 View   Basic metabolic panel   CBC with Differential   Brain natriuretic peptide   Potassium   Cardiac monitoring   Consult to hospitalist   Pulse oximetry (single)   Bipap   ED EKG   EKG 12-Lead   Insert peripheral IV     OTHER Significant initial  Findings:  labs showing:    Recent Labs  Lab 05/11/22 1256 05/12/22 0606 05/13/22 0350 05/14/22 0653 05/15/22 0413 05/17/22 2050  NA 136 138 139 138 140 136  K 5.0 4.9 5.0 4.7 4.7 5.3*  CO2 24 25 24 24 24 23   GLUCOSE 103* 139* 127* 127* 142* 116*  BUN 48* 53* 60* 60* 63* 68*  CREATININE 2.60* 3.03* 3.26* 3.16* 3.39* 3.46*  CALCIUM 9.1 9.1 8.9 9.1 9.1 9.1  MG 2.4  --   --   --   --   --     Cr   stable,    Lab Results  Component Value Date   CREATININE 3.46 (H) 05/17/2022   CREATININE 3.39 (H) 05/15/2022   CREATININE 3.16 (  H) 05/14/2022    Recent Labs  Lab 05/11/22 1256  AST 14*  ALT 15  ALKPHOS 62  BILITOT 0.4  PROT 6.9  ALBUMIN 3.5   Lab Results   Component Value Date   CALCIUM 9.1 05/17/2022   PHOS 4.2 04/30/2022          Plt: Lab Results  Component Value Date   PLT 289 05/17/2022      Venous  Blood Gas result:  pH  7.310 Sodium 136 mmol/L   pCO2, Ven 53.8 mmHg Potassium 6.2 High  mmol/L  pO2, Ven 45 mmHg      ABG    Component Value Date/Time   HCO3 25.4 04/23/2022 0910   TCO2 27 04/23/2022 0910   ACIDBASEDEF 1.0 04/23/2022 0910   O2SAT 77 04/23/2022 0910       Recent Labs  Lab 05/11/22 1256 05/17/22 2050  WBC 6.8 5.0  NEUTROABS 4.3 2.7  HGB 10.3* 10.5*  HCT 33.3* 34.2*  MCV 89.8 91.2  PLT 221 289    HG/HCT stable,       Component Value Date/Time   HGB 10.5 (L) 05/17/2022 2050   HCT 34.2 (L) 05/17/2022 2050   MCV 91.2 05/17/2022 2050      Cardiac Panel (last 3 results) No results for input(s): "CKTOTAL", "CKMB", "TROPONINI", "RELINDX" in the last 72 hours.  .car BNP (last 3 results) Recent Labs    04/24/22 2109 05/11/22 1256 05/17/22 2050  BNP 257.2* 480.5* 132.8*      DM  labs:  HbA1C: Recent Labs    12/21/21 0813 04/26/22 0047  HGBA1C 7.0* 6.9*       CBG (last 3)  No results for input(s): "GLUCAP" in the last 72 hours.        Cultures:    Component Value Date/Time   SDES URINE, CLEAN CATCH 02/19/2022 1650   SPECREQUEST  02/19/2022 1650    NONE Performed at [facility], [place] [address], [place], [place] [address]    CULT MULTIPLE SPECIES PRESENT, SUGGEST RECOLLECTION (A) 02/19/2022 1650   REPTSTATUS 02/20/2022 FINAL 02/19/2022 1650     Radiological Exams on Admission: DG Chest Port 1 View  Result Date: 05/17/2022 CLINICAL DATA:  Shortness of breath, respiratory distress EXAM: PORTABLE CHEST 1 VIEW COMPARISON:  05/12/2022 FINDINGS: Cardiomegaly, vascular congestion. Interstitial prominence could reflect interstitial edema. No effusions or acute bony abnormality. Findings similar to prior study. IMPRESSION: Cardiomegaly with vascular congestion and probable  early interstitial edema. Findings unchanged. Electronically Signed   By: Rolm Baptise M.D.   On: 05/17/2022 21:09   _______________________________________________________________________________________________________ Latest  Blood pressure (!) 150/99, pulse 66, temperature 97.9 F (36.6 C), temperature source Axillary, resp. rate 17, height 6\' 2"  (1.88 m), weight 122.9 kg, SpO2 98 %.   Vitals  labs and radiology finding personally reviewed  Review of Systems:    Pertinent positives include:   abdominal pain, nausea  Constitutional:  No weight loss, night sweats, Fevers, chills, fatigue, weight loss  HEENT:  No headaches, Difficulty swallowing,Tooth/dental problems,Sore throat,  No sneezing, itching, ear ache, nasal congestion, post nasal drip,  Cardio-vascular:  No chest pain, Orthopnea, PND, anasarca, dizziness, palpitations.no Bilateral lower extremity swelling  GI:  No heartburn, indigestion,, vomiting, diarrhea, change in bowel habits, loss of appetite, melena, blood in stool, hematemesis Resp:  no shortness of breath at rest. No dyspnea on exertion, No excess mucus, no productive cough, No non-productive cough, No coughing up of blood.No change in color of mucus.No  wheezing. Skin:  no rash or lesions. No jaundice GU:  no dysuria, change in color of urine, no urgency or frequency. No straining to urinate.  No flank pain.  Musculoskeletal:  No joint pain or no joint swelling. No decreased range of motion. No back pain.  Psych:  No change in mood or affect. No depression or anxiety. No memory loss.  Neuro: no localizing neurological complaints, no tingling, no weakness, no double vision, no gait abnormality, no slurred speech, no confusion  All systems reviewed and apart from [place] all are negative _______________________________________________________________________________________________ Past Medical History:   Past Medical History:  Diagnosis Date   Abscess 02/2017    LEFT GLUTEAL    Acid reflux    Alcohol abuse    Anemia    CHF (congestive heart failure) (HCC)    CKD (chronic kidney disease) stage 4, GFR 15-29 ml/min (HCC) 04/11/2020   Cocaine abuse ([place])    DDD (degenerative disc disease), lumbar    Diabetes mellitus without complication ([place])    Dyspnea    Enlarged prostate    Hypertension    Seizures ([place])    Spinal stenosis, lumbar       Past Surgical History:  Procedure Laterality Date   BACK SURGERY     cervical x2   BIOPSY  08/21/2018   Procedure: BIOPSY;  Surgeon: Ronald Lobo, MD;  Location: [facility];  Service: Endoscopy;;   ESOPHAGOGASTRODUODENOSCOPY (EGD) WITH PROPOFOL N/A 08/21/2018   Procedure: ESOPHAGOGASTRODUODENOSCOPY (EGD) WITH PROPOFOL;  Surgeon: Ronald Lobo, MD;  Location: [facility];  Service: Endoscopy;  Laterality: N/A;   INCISION AND DRAINAGE PERIRECTAL ABSCESS N/A 03/26/2017   Procedure: IRRIGATION AND DEBRIDEMENT PERIRECTAL ABSCESS;  Surgeon: Rolm Bookbinder, MD;  Location: [facility];  Service: General;  Laterality: N/A;    Social History:  Ambulatory   wheelchair bound     reports that he quit smoking about 5 years ago. His smoking use included cigarettes. He has a 10.00 pack-year smoking history. He has never used smokeless tobacco. He reports that he does not currently use alcohol after a past usage of about 12.0 standard drinks of alcohol per week. He reports that he does not currently use drugs after having used the following drugs: Cocaine and Marijuana.      Family History:   Family History  Problem Relation Age of Onset   Hypertension Mother    Cancer - Lung Father    ______________________________________________________________________________________________ Allergies: Allergies  Allergen Reactions   Gabapentin     unknown   Tramadol     unknown   Trazodone And Nefazodone Other (See Comments)    Family reports seizures from this medication     Prior to Admission medications    Medication Sig Start Date End Date Taking? Authorizing Provider  acetaminophen (TYLENOL) 500 MG tablet Take 1,000 mg by mouth 3 (three) times daily as needed for mild pain.    [provider]  apixaban (ELIQUIS) 5 MG TABS tablet Take 1 tablet (5 mg total) by mouth 2 (two) times daily. 01/10/22   Horton, Alvin Critchley, DO  atorvastatin (LIPITOR) 80 MG tablet Take 1 tablet (80 mg total) by mouth daily. Patient not taking: Reported on 05/11/2022 01/10/22   Horton, Drue Dun M, DO  carvedilol (COREG) 12.5 MG tablet Take 1 tablet (12.5 mg total) by mouth 2 (two) times daily. 04/30/22 05/30/22  Arrien, Jimmy Picket, MD  Cholecalciferol (QC VITAMIN D3) 50 MCG (2000 UT) TABS Take 2,000 Units by mouth daily.  [provider]  finasteride (PROSCAR) 5 MG tablet Take 1 tablet (5 mg total) by mouth daily. 05/16/22   Domenic Polite, MD  furosemide (LASIX) 20 MG tablet Take 3 tablets (60 mg total) by mouth daily. 05/15/22 06/14/22  Domenic Polite, MD  guaiFENesin (ROBITUSSIN) 100 MG/5ML liquid Take 10 mLs by mouth every 4 (four) hours as needed for cough or to loosen phlegm. Give 37mL (equal to 200mg ) by mouth every four hours as needed for cough x 72 hours. **up to 2400mg  24 hours** 05/11/22   [provider]  HYDROcodone-acetaminophen (NORCO/VICODIN) 5-325 MG tablet Take 1 tablet by mouth 2 (two) times daily as needed for moderate pain. 1 tablet by mouth twice every day as needed x 14 days for breakthrough pain. 05/15/22   Domenic Polite, MD  ipratropium-albuterol (DUONEB) 0.5-2.5 (3) MG/3ML SOLN Take 3 mLs by nebulization every 4 (four) hours as needed (shortness of breath/wheezing).    [provider]  isosorbide-hydrALAZINE (BIDIL) 20-37.5 MG tablet Take 2 tablets by mouth 3 (three) times daily. 05/15/22 06/14/22  Domenic Polite, MD  Lacosamide 100 MG TABS Take 100 mg by mouth 2 (two) times daily.    [provider]  levETIRAcetam (KEPPRA) 750 MG tablet Take 1 tablet (750 mg  total) by mouth 2 (two) times daily. 02/21/22   Ghimire, Henreitta Leber, MD  Magnesium Oxide 420 MG TABS Take 420 mg by mouth daily.    [provider]  melatonin 3 MG TABS tablet Take 1 tablet (3 mg total) by mouth at bedtime. 05/15/22   Domenic Polite, MD  mometasone-formoterol [facility]) 200-5 MCG/ACT AERO Inhale 2 puffs into the lungs 2 (two) times daily. 01/31/22 05/11/22  Darliss Cheney, MD  naloxone [facility]) nasal spray 4 mg/0.1 mL Place 1 spray into the nose as needed (opioid overdose).    [provider]  tizanidine (ZANAFLEX) 2 MG capsule Take 1 capsule (2 mg total) by mouth 2 (two) times daily as needed for up to 5 days for muscle spasms. 05/15/22 05/20/22  Domenic Polite, MD    ___________________________________________________________________________________________________ Physical Exam:    05/17/2022    9:45 PM 05/17/2022    9:15 PM 05/17/2022    8:53 PM  Vitals with BMI  Height   6\' 2"   Weight   751 lbs  BMI   02.58  Systolic 527 782 423  Diastolic 99 95 536  Pulse 66 68 71     1. General:  in No  Acute distress    Chronically ill   -appearing 2. Psychological: Alert and   Oriented 3. Head/ENT:   Moist  Mucous Membranes                          Head Non traumatic, neck supple                          Poor Dentition 4. SKIN: normal   Skin turgor,  Skin clean Dry and intact no rash 5. Heart: Regular rate and rhythm no  Murmur, no Rub or gallop 6. Lungs:  on BIPAP no wheezes or crackles   7. Abdomen: Soft, RUQ tender,  distended   obese  bowel sounds present 8. Lower extremities: no clubbing, cyanosis,  3+ edema up to the thighs 9. Neurologically Grossly intact, moving all 4 extremities equally   10. MSK: Normal range of motion    Chart has been reviewed  ______________________________________________________________________________________________  Assessment/Plan  71 y.o. male with medical history significant of CHF, EF 35%, CKD stage IV, DVT,  hypertension, T2DM, bed and wheelchair bound, and COPD, Malignant neoplasm prostate   Admitted for   Acute on chronic congestive heart failure,   Acute respiratory failure  with hypoxia      Present on Admission:  Acute on chronic combined systolic and diastolic CHF (congestive heart failure) (HCC)  Type 2 diabetes mellitus with hyperlipidemia (HCC)  DVT (deep venous thrombosis) (HCC)  BPH (benign prostatic hyperplasia)  COPD (chronic obstructive pulmonary disease) (HCC)  CKD (chronic kidney disease) stage 4, GFR 15-29 ml/min (HCC)  Anemia of chronic disease  Hyperkalemia  Constipation  Acute respiratory failure with hypoxia (HCC)  UTI (urinary tract infection)     Type 2 diabetes mellitus with hyperlipidemia (HCC) Order sliding scale check hemoglobin A1c and TSH  DVT (deep venous thrombosis) (HCC) Chronic stable continue Eliquis 5 mg p.o. twice daily  BPH (benign prostatic hyperplasia) Continue Proscar 5 mg p.o. daily  Seizure disorder (HCC) Restart lacosamide 100 mg p.o. twice a day Keppra. at 750 mg twice daily change to IV in case unable to tolerate p.o  COPD (chronic obstructive pulmonary disease) (HCC) Chronic stable continue home medications DuoNeb Dulera  Acute on chronic combined systolic and diastolic CHF (congestive heart failure) ([place]) - Pt diagnosed with CHF based on presence of the following  rales on exam  cardiomegaly, Pulmonary edema on CXR, and  bilateral leg edema, DOE,  With some  response to IV diuretic in ER will give additional 40mg  IV for total of 80 mg  admit on telemetry,  cycle cardiac enzymes,   obtain serial ECG  to evaluate for ischemia as a cause of heart failure  monitor daily weight:  Filed Weights   05/17/22 2053  Weight: 122.9 kg   Last BNP BNP (last 3 results) Recent Labs    04/24/22 2109 05/11/22 1256 05/17/22 2050  BNP 257.2* 480.5* 132.8*      diurese with IV lasix and monitor orthostatics and creatinine to avoid over  diuresis.  Order echogram to evaluate EF and valves  ACE/ARBi  Contraindicated  Would benefit from cardiology consult in AM     CKD (chronic kidney disease) stage 4, GFR 15-29 ml/min (HCC)  -chronic avoid nephrotoxic medications such as NSAIDs, Vanco Zosyn combo,  avoid hypotension, continue to follow renal function   Anemia of chronic disease obtain anemia panel  Hyperkalemia Mild no ECG changes repeat after IV lasix administration  Constipation Ordered bowel regimen  Acute respiratory failure with hypoxia (HCC)  this patient has acute respiratory failure with Hypoxia  as documented by the presence of following: O2 saturatio< 90% on RA Likely due to:   CHF exacerbation,   Provide O2 therapy and titrate as needed  Continuous pulse ox Rest on Bipap for tonight      UTI (urinary tract infection) Reports abd pain and pressure Foley in place  UA showing multiple bacteria Obtain urine culture and cover with rocephin for tonight     Other plan as per orders.  DVT prophylaxis: eliquis    Code Status:  DNR/DNI   as per patient  family paperwork at bedside I had personally discussed CODE STATUS with patient and family     Family Communication:   Family  at  Bedside  plan of care was discussed  with  Sister,   mother  Disposition Plan:  Back to current facility when stable                             Following barriers for discharge:                            Electrolytes corrected                               Anemia stable CHF improved                                                   Would benefit from PT/OT eval prior to DC  Ordered                   Swallow eval - SLP ordered                                    Transition of care consulted                   Nutrition    consulted                                     Consults called: none   Admission status:  ED Disposition     ED Disposition  Admit   Condition  --    [place]: [facility] [[id number]]  Level of Care: Progressive [102]  Admit to Progressive based on following criteria: CARDIOVASCULAR & THORACIC of moderate stability with acute coronary syndrome symptoms/low risk myocardial infarction/hypertensive urgency/arrhythmias/heart failure potentially compromising stability and stable post cardiovascular intervention patients.  May admit patient to [facility] or [facility] if equivalent level of care is available:: No  Covid Evaluation: Asymptomatic - no recent exposure (last 10 days) testing not required  Diagnosis: Acute on chronic combined systolic and diastolic CHF (congestive heart failure) [facility]) [[id number]]  Admitting Physician: Toy Baker [[id number]]  Attending Physician: Toy Baker [[id number]]  Certification:: I certify this patient will need inpatient services for at least 2 midnights  Estimated Length of Stay: 2         inpatient     I Expect 2 midnight stay secondary to severity of patient's current illness need for inpatient interventions justified by the following:  hemodynamic instability despite optimal treatment ( hypoxia,  )  Severe lab/radiological/exam abnormalities including:    CXR showing CHF and extensive comorbidities including:    DM2    CHF  COPD/asthma   CKD  malignancy,   Chronic anticoagulation  That are currently affecting medical management.   I expect  patient to be hospitalized for 2 midnights requiring inpatient medical care.  Patient is at high risk for adverse outcome (such as loss of life or disability) if not treated.  Indication for inpatient stay as follows:    Hemodynamic instability despite maximal medical therapy,    inability to maintain oral hydration    New or worsening hypoxia   Need for IV diuretics  need for biPAP    Level  of care   progressive tele indefinitely please discontinue once patient no longer qualifies COVID-19 Labs     Debra Colon 05/18/2022, 12:08 AM    [facility]     after 2 AM please page floor coverage PA If 7AM-7PM, please contact the day team taking care of the patient using [email or web address]   Patient was evaluated in the context of the global COVID-19 pandemic, which necessitated consideration that the patient might be at risk for infection with the SARS-CoV-2 virus that causes COVID-19. Institutional protocols and algorithms that pertain to the evaluation of patients at risk for COVID-19 are in a state of rapid change based on information released by regulatory bodies including the CDC and federal and state organizations. These policies and algorithms were followed during the patient's care.

## 2022-05-17 NOTE — Assessment & Plan Note (Signed)
Chronic stable continue home medications DuoNeb Ruthe Mannan

## 2022-05-17 NOTE — Assessment & Plan Note (Signed)
obtain anemia panel 

## 2022-05-17 NOTE — Assessment & Plan Note (Signed)
Chronic stable continue Eliquis 5 mg p.o. twice daily

## 2022-05-17 NOTE — ED Provider Notes (Signed)
[facility] Provider Note   CSN: [id number] Arrival date & time: 05/17/22  2041     History  Chief Complaint  Patient presents with   Shortness of Breath    On cpap    [facility] is a 71 y.o. male.  Pt is a 71 yo male with pmhx significant for CHF, CKD, HTN, GERD, DDD, and seizures.  Pt was admitted from 8/13-8/17 for CHF exac.  Pt went to a rehab facility.  Per EMS, he has been sob all day.  When they arrived to his facility, he was 90% on RA.  They put pt on a 100% NRB which helped his O2, but did not help his work of breathing.  He was put on CPAP and that has helped pt significantly.  BP was elevated for EMS (210/120), so they gave him 3 nitro.   CHF e     Home Medications Prior to Admission medications   Medication Sig Start Date End Date Taking? Authorizing Provider  acetaminophen (TYLENOL) 500 MG tablet Take 1,000 mg by mouth 3 (three) times daily as needed for mild pain.    [provider]  apixaban (ELIQUIS) 5 MG TABS tablet Take 1 tablet (5 mg total) by mouth 2 (two) times daily. 01/10/22   Horton, Alvin Critchley, DO  atorvastatin (LIPITOR) 80 MG tablet Take 1 tablet (80 mg total) by mouth daily. Patient not taking: Reported on 05/11/2022 01/10/22   Horton, Drue Dun M, DO  carvedilol (COREG) 12.5 MG tablet Take 1 tablet (12.5 mg total) by mouth 2 (two) times daily. 04/30/22 05/30/22  Arrien, Jimmy Picket, MD  Cholecalciferol (QC VITAMIN D3) 50 MCG (2000 UT) TABS Take 2,000 Units by mouth daily.    [provider]  finasteride (PROSCAR) 5 MG tablet Take 1 tablet (5 mg total) by mouth daily. 05/16/22   Domenic Polite, MD  furosemide (LASIX) 20 MG tablet Take 3 tablets (60 mg total) by mouth daily. 05/15/22 06/14/22  Domenic Polite, MD  guaiFENesin (ROBITUSSIN) 100 MG/5ML liquid Take 10 mLs by mouth every 4 (four) hours as needed for cough or to loosen phlegm. Give 45mL (equal to 200mg ) by mouth every four hours as needed for  cough x 72 hours. **up to 2400mg  24 hours** 05/11/22   [provider]  HYDROcodone-acetaminophen (NORCO/VICODIN) 5-325 MG tablet Take 1 tablet by mouth 2 (two) times daily as needed for moderate pain. 1 tablet by mouth twice every day as needed x 14 days for breakthrough pain. 05/15/22   Domenic Polite, MD  ipratropium-albuterol (DUONEB) 0.5-2.5 (3) MG/3ML SOLN Take 3 mLs by nebulization every 4 (four) hours as needed (shortness of breath/wheezing).    [provider]  isosorbide-hydrALAZINE (BIDIL) 20-37.5 MG tablet Take 2 tablets by mouth 3 (three) times daily. 05/15/22 06/14/22  Domenic Polite, MD  Lacosamide 100 MG TABS Take 100 mg by mouth 2 (two) times daily.    [provider]  levETIRAcetam (KEPPRA) 750 MG tablet Take 1 tablet (750 mg total) by mouth 2 (two) times daily. 02/21/22   Ghimire, Henreitta Leber, MD  Magnesium Oxide 420 MG TABS Take 420 mg by mouth daily.    [provider]  melatonin 3 MG TABS tablet Take 1 tablet (3 mg total) by mouth at bedtime. 05/15/22   Domenic Polite, MD  mometasone-formoterol [facility]) 200-5 MCG/ACT AERO Inhale 2 puffs into the lungs 2 (two) times daily. 01/31/22 05/11/22  Darliss Cheney, MD  naloxone [facility]: Lambert Rhonda W) nasal spray 4  mg/0.1 mL Place 1 spray into the nose as needed (opioid overdose).    [provider]  tizanidine (ZANAFLEX) 2 MG capsule Take 1 capsule (2 mg total) by mouth 2 (two) times daily as needed for up to 5 days for muscle spasms. 05/15/22 05/20/22  Domenic Polite, MD      Allergies    Gabapentin, Tramadol, and Trazodone and nefazodone    Review of Systems   Review of Systems  Respiratory:  Positive for shortness of breath.   All other systems reviewed and are negative.   Physical Exam Updated Vital Signs BP (!) 150/99   Pulse 66   Temp 97.9 F (36.6 C) (Axillary)   Resp 17   Ht 6\' 2"  (1.88 m)   Wt 122.9 kg   SpO2 98%   BMI 34.79 kg/m  Physical Exam Vitals and nursing note reviewed.   Constitutional:      General: He is in acute distress.     Appearance: He is well-developed. He is obese. He is ill-appearing.  HENT:     Head: Normocephalic and atraumatic.     Mouth/Throat:     Mouth: Mucous membranes are moist.  Eyes:     Extraocular Movements: Extraocular movements intact.     Pupils: Pupils are equal, round, and reactive to light.  Cardiovascular:     Rate and Rhythm: Normal rate and regular rhythm.  Pulmonary:     Effort: Tachypnea and respiratory distress present.     Breath sounds: Rhonchi present.  Abdominal:     General: Bowel sounds are normal.     Palpations: Abdomen is soft.  Musculoskeletal:        General: Normal range of motion.     Cervical back: Normal range of motion and neck supple.     Right lower leg: Edema present.     Left lower leg: Edema present.  Skin:    General: Skin is warm and dry.     Capillary Refill: Capillary refill takes less than 2 seconds.  Neurological:     General: No focal deficit present.     Mental Status: He is alert and oriented to person, place, and time.  Psychiatric:        Mood and Affect: Mood normal.        Behavior: Behavior normal.     ED Results / Procedures / Treatments   Labs (all labs ordered are listed, but only abnormal results are displayed) Labs Reviewed  BASIC METABOLIC PANEL - Abnormal; Notable for the following components:      Result Value   Potassium 5.3 (*)    Glucose, Bld 116 (*)    BUN 68 (*)    Creatinine, Ser 3.46 (*)    GFR, Estimated 18 (*)    All other components within normal limits  CBC WITH DIFFERENTIAL/PLATELET - Abnormal; Notable for the following components:   RBC 3.75 (*)    Hemoglobin 10.5 (*)    HCT 34.2 (*)    All other components within normal limits  BRAIN NATRIURETIC PEPTIDE - Abnormal; Notable for the following components:   B Natriuretic Peptide 132.8 (*)    All other components within normal limits  POTASSIUM  I-STAT VENOUS BLOOD GAS, ED  TROPONIN I  (HIGH SENSITIVITY)  TROPONIN I (HIGH SENSITIVITY)    EKG EKG Interpretation  Date/Time:  Saturday May 17 2022 20:58:25 EDT Ventricular Rate:  71 PR Interval:  59 QRS Duration: 170 QT Interval:  466 QTC Calculation: 507  R Axis:   -58 Text Interpretation: Sinus rhythm Short PR interval Left atrial enlargement Left bundle branch block No significant change since last tracing Confirmed by [name] [phone]) on 05/17/2022 9:56:15 PM  Radiology DG Chest Port 1 View  Result Date: 05/17/2022 CLINICAL DATA:  Shortness of breath, respiratory distress EXAM: PORTABLE CHEST 1 VIEW COMPARISON:  05/12/2022 FINDINGS: Cardiomegaly, vascular congestion. Interstitial prominence could reflect interstitial edema. No effusions or acute bony abnormality. Findings similar to prior study. IMPRESSION: Cardiomegaly with vascular congestion and probable early interstitial edema. Findings unchanged. Electronically Signed   By: Rolm Baptise M.D.   On: 05/17/2022 21:09    Procedures Procedures    Medications Ordered in ED Medications  furosemide (LASIX) injection 40 mg (40 mg Intravenous Given 05/17/22 2056)    ED Course/ Medical Decision Making/ A&P                           Medical Decision Making Amount and/or Complexity of Data Reviewed Labs: ordered. Radiology: ordered.  Risk Prescription drug management. Decision regarding hospitalization.   This patient presents to the ED for concern of sob, this involves an extensive number of treatment options, and is a complaint that carries with it a high risk of complications and morbidity.  The differential diagnosis includes chf, pna, electrolyte abn   Co morbidities that complicate the patient evaluation  CHF, CKD, HTN, GERD, DDD, and seizures   Additional history obtained:  Additional history obtained from epic chart reveiw External records from outside source obtained and reviewed including EMS report   Lab Tests:  I Ordered, and  personally interpreted labs.  The pertinent results include:  cbc with hgb 10.5 (chronic), bmp with K slightly elevated at 5.3; bun elevated at 68 and Cr elevated at 3.46 (chronic); bnp 132; trop 14   Imaging Studies ordered:  I ordered imaging studies including cxr  I independently visualized and interpreted imaging which showed  IMPRESSION:  Cardiomegaly with vascular congestion and probable early  interstitial edema. Findings unchanged.   I agree with the radiologist interpretation   Cardiac Monitoring:  The patient was maintained on a cardiac monitor.  I personally viewed and interpreted the cardiac monitored which showed an underlying rhythm of: nsr   Medicines ordered and prescription drug management:  I ordered medication including lasix  for chf  Reevaluation of the patient after these medicines showed that the patient improved I have reviewed the patients home medicines and have made adjustments as needed  Critical Interventions:  bipap   Consultations Obtained:  I requested consultation with the hospitalists (Dr. Roel Cluck),  and discussed lab and imaging findings as well as pertinent plan - she will admit   Problem List / ED Course:  CHF exac:  pt put on bipap and looks much more comfortable.  Lasix given for CHF exacerbation.  CKD: chronic    Reevaluation:  After the interventions noted above, I reevaluated the patient and found that they have :improved   Social Determinants of Health:  Lives in rehab   Dispostion:  After consideration of the diagnostic results and the patients response to treatment, I feel that the patent would benefit from admission.    CRITICAL CARE Performed by: [name]   Total critical care time: 30 minutes  Critical care time was exclusive of separately billable procedures and treating other patients.  Critical care was necessary to treat or prevent imminent or life-threatening deterioration.  Critical care was  time spent personally by me on the following activities: development of treatment plan with patient and/or surrogate as well as nursing, discussions with consultants, evaluation of patient's response to treatment, examination of patient, obtaining history from patient or surrogate, ordering and performing treatments and interventions, ordering and review of laboratory studies, ordering and review of radiographic studies, pulse oximetry and re-evaluation of patient's condition.         Final Clinical Impression(s) / ED Diagnoses Final diagnoses:  Acute on chronic congestive heart failure, unspecified heart failure type ([place])  Acute respiratory failure, unspecified whether with hypoxia or hypercapnia (HCC)  CKD (chronic kidney disease) stage 4, GFR 15-29 ml/min [facility])    Rx / DC Orders ED Discharge Orders     None         [name], MD 05/17/22 2228

## 2022-05-17 NOTE — Assessment & Plan Note (Signed)
Continue Proscar 5 mg p.o. daily 

## 2022-05-17 NOTE — Assessment & Plan Note (Signed)
-  chronic avoid nephrotoxic medications such as NSAIDs, Vanco Zosyn combo,  avoid hypotension, continue to follow renal function  

## 2022-05-18 DIAGNOSIS — J9601 Acute respiratory failure with hypoxia: Secondary | ICD-10-CM | POA: Diagnosis present

## 2022-05-18 DIAGNOSIS — N390 Urinary tract infection, site not specified: Secondary | ICD-10-CM | POA: Diagnosis present

## 2022-05-18 DIAGNOSIS — I5043 Acute on chronic combined systolic (congestive) and diastolic (congestive) heart failure: Secondary | ICD-10-CM | POA: Diagnosis not present

## 2022-05-18 LAB — BASIC METABOLIC PANEL
Anion gap: 9 (ref 5–15)
BUN: 71 mg/dL — ABNORMAL HIGH (ref 8–23)
CO2: 23 mmol/L (ref 22–32)
Calcium: 9.2 mg/dL (ref 8.9–10.3)
Chloride: 104 mmol/L (ref 98–111)
Creatinine, Ser: 3.33 mg/dL — ABNORMAL HIGH (ref 0.61–1.24)
GFR, Estimated: 19 mL/min — ABNORMAL LOW (ref >60)
Glucose, Bld: 184 mg/dL — ABNORMAL HIGH (ref 70–99)
Potassium: 4.8 mmol/L (ref 3.5–5.1)
Sodium: 136 mmol/L (ref 135–145)

## 2022-05-18 LAB — OSMOLALITY: Osmolality: 318 mOsm/kg — ABNORMAL HIGH (ref 275–295)

## 2022-05-18 LAB — CBC
HCT: 32.3 % — ABNORMAL LOW (ref 39.0–52.0)
Hemoglobin: 10.1 g/dL — ABNORMAL LOW (ref 13.0–17.0)
MCH: 28.1 pg (ref 26.0–34.0)
MCHC: 31.3 g/dL (ref 30.0–36.0)
MCV: 89.7 fL (ref 80.0–100.0)
Platelets: 305 10*3/uL (ref 150–400)
RBC: 3.6 MIL/uL — ABNORMAL LOW (ref 4.22–5.81)
RDW: 14.7 % (ref 11.5–15.5)
WBC: 4.9 10*3/uL (ref 4.0–10.5)
nRBC: 0 % (ref 0.0–0.2)

## 2022-05-18 LAB — CBG MONITORING, ED
Glucose-Capillary: 103 mg/dL — ABNORMAL HIGH (ref 70–99)
Glucose-Capillary: 141 mg/dL — ABNORMAL HIGH (ref 70–99)
Glucose-Capillary: 163 mg/dL — ABNORMAL HIGH (ref 70–99)

## 2022-05-18 LAB — IRON AND TIBC
Iron: 85 ug/dL (ref 45–182)
Saturation Ratios: 25 % (ref 17.9–39.5)
TIBC: 344 ug/dL (ref 250–450)
UIBC: 259 ug/dL

## 2022-05-18 LAB — PREALBUMIN: Prealbumin: 27 mg/dL (ref 18–38)

## 2022-05-18 LAB — PHOSPHORUS: Phosphorus: 5.1 mg/dL — ABNORMAL HIGH (ref 2.5–4.6)

## 2022-05-18 LAB — VITAMIN B12: Vitamin B-12: 229 pg/mL (ref 180–914)

## 2022-05-18 LAB — GLUCOSE, CAPILLARY
Glucose-Capillary: 171 mg/dL — ABNORMAL HIGH (ref 70–99)
Glucose-Capillary: 153 mg/dL — ABNORMAL HIGH (ref 70–99)
Glucose-Capillary: 163 mg/dL — ABNORMAL HIGH (ref 70–99)

## 2022-05-18 LAB — MAGNESIUM: Magnesium: 2.1 mg/dL (ref 1.7–2.4)

## 2022-05-18 LAB — FOLATE: Folate: 9.4 ng/mL (ref >5.9)

## 2022-05-18 MED ORDER — FUROSEMIDE 10 MG/ML IJ SOLN
80.0000 mg | Freq: Two times a day (BID) | INTRAMUSCULAR | Status: DC
  Administered 2022-05-18 – 2022-05-20 (×4): 80 mg via INTRAVENOUS
  Filled 2022-05-18 (×4): qty 8

## 2022-05-18 MED ORDER — LEVETIRACETAM 750 MG PO TABS
750.0000 mg | ORAL_TABLET | Freq: Two times a day (BID) | ORAL | Status: DC
Start: 2022-05-18 — End: 2022-05-27
  Administered 2022-05-18 – 2022-05-26 (×17): 750 mg via ORAL
  Filled 2022-05-18 (×17): qty 1

## 2022-05-18 MED ORDER — LACOSAMIDE 50 MG PO TABS
100.0000 mg | ORAL_TABLET | Freq: Two times a day (BID) | ORAL | Status: DC
  Administered 2022-05-18 – 2022-05-26 (×18): 100 mg via ORAL
  Filled 2022-05-18 (×18): qty 2

## 2022-05-18 MED ORDER — CARVEDILOL 12.5 MG PO TABS
12.5000 mg | ORAL_TABLET | Freq: Two times a day (BID) | ORAL | Status: DC
  Administered 2022-05-18 – 2022-05-19 (×2): 12.5 mg via ORAL
  Filled 2022-05-18 (×2): qty 1

## 2022-05-18 MED ORDER — MELATONIN 5 MG PO TABS
10.0000 mg | ORAL_TABLET | Freq: Every evening | ORAL | Status: DC | PRN
  Administered 2022-05-18 – 2022-05-25 (×7): 10 mg via ORAL
  Filled 2022-05-18 (×7): qty 2

## 2022-05-18 MED ORDER — SENNOSIDES-DOCUSATE SODIUM 8.6-50 MG PO TABS
1.0000 | ORAL_TABLET | Freq: Two times a day (BID) | ORAL | Status: DC
  Administered 2022-05-18 – 2022-05-26 (×13): 1 via ORAL
  Filled 2022-05-18 (×15): qty 1

## 2022-05-18 MED ORDER — ISOSORB DINITRATE-HYDRALAZINE 20-37.5 MG PO TABS
2.0000 | ORAL_TABLET | Freq: Three times a day (TID) | ORAL | Status: DC
  Administered 2022-05-18 (×2): 2 via ORAL
  Filled 2022-05-18 (×2): qty 2

## 2022-05-18 MED ORDER — SODIUM CHLORIDE 0.9 % IV SOLN
1.0000 g | Freq: Every day | INTRAVENOUS | Status: DC
  Administered 2022-05-18 – 2022-05-25 (×8): 1 g via INTRAVENOUS
  Filled 2022-05-18 (×8): qty 10

## 2022-05-18 NOTE — ED Notes (Signed)
Admitting at bedside 

## 2022-05-18 NOTE — Progress Notes (Signed)
PROGRESS NOTE    [facility]  RAQ:[id number] DOB: Jul 15, 1951 DOA: 05/17/2022 PCP: [facility]  [facility] is a 71 y.o. resident of SNF with chronic systolic CHF, EF 45%, CKD stage IV, DVT, hypertension, T2DM, bed and wheelchair bound, and COPD, Malignant neoplasm prostate just discharged from [facility] on 8/17 after treatment for CHF, he was 6.5 L negative and discharged on furosemide 80 Mg daily  -Back from [place] with increased dyspnea, swelling and abdominal tightness -Chest x-ray with cardiomegaly,?  Early edema, blood pressure in the 200s initially, improved after nitro and Lasix  Subjective: -Short of breath last night, required BiPAP, now improving  Assessment and Plan:  Acute on chronic systolic CHF (congestive heart failure) (HCC) -Known cardiomyopathy, EF 35-40% -Recurrent admissions with CHF, further complicated by CKD 4, followed at the [facility] -Reports compliance with medications, he was 6.5 L negative at the time of recent discharge, continue IV Lasix today, increased dose to 80 Mg twice daily, continue carvedilol and BiDil -Will request nephrology input as well -Will attempt to arrange trilogy, BiPAP at SNF -GDMT limited by CKD 4 -Importance of compliance with diet again emphasized -BMP in a.m.   CKD (chronic kidney disease) stage 4, GFR 15-29 ml/min (HCC) AKI on chronic kidney disease stage IV.  -Baseline creatinine around 3, 3.4 on admission, await AM labs -Diuretics as noted above  ?  UTI -Abnormal UA, marginal symptoms, continue ceftriaxone, follow-up urine culture  Hypertensive urgency -Improving, continue Coreg and BiDil   Diabetes mellitus without complication (HCC) -CBGs are stable, continue sliding scale   Chronic deep vein thrombosis (DVT) (HCC) Right lower extremity DVT -Continue apixaban   COPD (chronic obstructive pulmonary disease) (HCC) -Continue bronchodilator therapy, no wheezing   Debility, bed and wheelchair bound -Poor  prognosis -Long-term SNF resident   Seizures ([place]) -Stable, continue with Vimpat and keppra.    Class 2 obesity Calculated BMI is 35,3    DVT prophylaxis: Add heparin subcutaneous Code Status: DNR Family Communication: Discussed with niece at bedside Dispo: Back to SNF when stable  Consultants:    Procedures:   Antimicrobials:    Objective: Vitals:   05/18/22 1000 05/18/22 1015 05/18/22 1030 05/18/22 1106  BP: 135/83 (!) 154/84 (!) 141/78   Pulse: 76 78 71   Resp: 17 (!) 22 20   Temp:      TempSrc:      SpO2: 98% 99% 99%   Weight:    126.7 kg  Height:    6\' 2"  (1.88 m)    Intake/Output Summary (Last 24 hours) at 05/18/2022 1203 Last data filed at 05/18/2022 0600 Gross per 24 hour  Intake 29.36 ml  Output 1600 ml  Net -1570.64 ml   Filed Weights   05/17/22 2053 05/18/22 1106  Weight: 122.9 kg 126.7 kg    Examination:  General exam: Clear ill male sitting up in bed, AAOx3, no distress HEENT: Positive JVD CVS: S1-S2, regular rhythm Lungs: Few basilar Rales Abdomen: Soft, nontender, bowel sounds present Extremities: 1-2+ edema, extending to upper thighs Respiratory system: Clear to auscultation Skin: No rashes on exposed skin Psychiatry:  Mood & affect appropriate.     Data Reviewed:   CBC: Recent Labs  Lab 05/11/22 1256 05/17/22 2050 05/17/22 2251 05/18/22 0554  WBC 6.8 5.0  --  4.9  NEUTROABS 4.3 2.7  --   --   HGB 10.3* 10.5* 11.2* 10.1*  HCT 33.3* 34.2* 33.0* 32.3*  MCV 89.8 91.2  --  89.7  PLT 221 289  --  381   Basic Metabolic Panel: Recent Labs  Lab 05/11/22 1256 05/12/22 0606 05/13/22 0350 05/14/22 0653 05/15/22 0413 05/17/22 2050 05/17/22 2251  NA 136 138 139 138 140 136 136  K 5.0 4.9 5.0 4.7 4.7 5.3* 6.2*  CL 107 107 107 107 107 104  --   CO2 24 25 24 24 24 23   --   GLUCOSE 103* 139* 127* 127* 142* 116*  --   BUN 48* 53* 60* 60* 63* 68*  --   CREATININE 2.60* 3.03* 3.26* 3.16* 3.39* 3.46*  --   CALCIUM 9.1 9.1 8.9 9.1  9.1 9.1  --   MG 2.4  --   --   --   --   --   --    GFR: Estimated Creatinine Clearance: 26.9 mL/min (A) (by C-G formula based on SCr of 3.46 mg/dL (H)). Liver Function Tests: Recent Labs  Lab 05/11/22 1256  AST 14*  ALT 15  ALKPHOS 62  BILITOT 0.4  PROT 6.9  ALBUMIN 3.5   No results for input(s): "LIPASE", "AMYLASE" in the last 168 hours. No results for input(s): "AMMONIA" in the last 168 hours. Coagulation Profile: No results for input(s): "INR", "PROTIME" in the last 168 hours. Cardiac Enzymes: No results for input(s): "CKTOTAL", "CKMB", "CKMBINDEX", "TROPONINI" in the last 168 hours. BNP (last 3 results) No results for input(s): "PROBNP" in the last 8760 hours. HbA1C: No results for input(s): "HGBA1C" in the last 72 hours. CBG: Recent Labs  Lab 05/14/22 1602 05/18/22 0004 05/18/22 0500 05/18/22 0758 05/18/22 1154  GLUCAP 127* 141* 163* 103* 171*   Lipid Profile: No results for input(s): "CHOL", "HDL", "LDLCALC", "TRIG", "CHOLHDL", "LDLDIRECT" in the last 72 hours. Thyroid Function Tests: Recent Labs    05/17/22 2240  TSH 0.761   Anemia Panel: Recent Labs    05/17/22 2240  RETICCTPCT 1.8   Urine analysis:    Component Value Date/Time   COLORURINE YELLOW 05/17/2022 2240   APPEARANCEUR CLOUDY (A) 05/17/2022 2240   LABSPEC 1.015 05/17/2022 2240   PHURINE 6.0 05/17/2022 2240   GLUCOSEU NEGATIVE 05/17/2022 2240   HGBUR TRACE (A) 05/17/2022 2240   BILIRUBINUR NEGATIVE 05/17/2022 2240   KETONESUR NEGATIVE 05/17/2022 2240   PROTEINUR 100 (A) 05/17/2022 2240   UROBILINOGEN 0.2 03/11/2012 2012   NITRITE NEGATIVE 05/17/2022 2240   LEUKOCYTESUR LARGE (A) 05/17/2022 2240   Sepsis Labs: @LABRCNTIP (procalcitonin:4,lacticidven:4)  ) Recent Results (from the past 240 hour(s))  Resp Panel by RT-PCR (Flu A&B, Covid) Anterior Nasal Swab     Status: None   Collection Time: 05/11/22 12:39 PM   Specimen: Anterior Nasal Swab  Result Value Ref Range Status   SARS  Coronavirus 2 by RT PCR NEGATIVE NEGATIVE Final    Comment: (NOTE) SARS-CoV-2 target nucleic acids are NOT DETECTED.  The SARS-CoV-2 RNA is generally detectable in upper respiratory specimens during the acute phase of infection. The lowest concentration of SARS-CoV-2 viral copies this assay can detect is 138 copies/mL. A negative result does not preclude SARS-Cov-2 infection and should not be used as the sole basis for treatment or other patient management decisions. A negative result may occur with  improper specimen collection/handling, submission of specimen other than nasopharyngeal swab, presence of viral mutation(s) within the areas targeted by this assay, and inadequate number of viral copies(<138 copies/mL). A negative result must be combined with clinical observations, patient history, and epidemiological information. The expected result is Negative.  Fact Sheet for Patients:  EntrepreneurPulse.com.au  Fact Sheet for Healthcare Providers:  IncredibleEmployment.be  This test is no t yet approved or cleared by the [place] FDA and  has been authorized for detection and/or diagnosis of SARS-CoV-2 by FDA under an Emergency Use Authorization (EUA). This EUA will remain  in effect (meaning this test can be used) for the duration of the COVID-19 declaration under Section 564(b)(1) of the Act, 21 U.S.C.section 360bbb-3(b)(1), unless the authorization is terminated  or revoked sooner.       Influenza A by PCR NEGATIVE NEGATIVE Final   Influenza B by PCR NEGATIVE NEGATIVE Final    Comment: (NOTE) The Xpert Xpress SARS-CoV-2/FLU/RSV plus assay is intended as an aid in the diagnosis of influenza from Nasopharyngeal swab specimens and should not be used as a sole basis for treatment. Nasal washings and aspirates are unacceptable for Xpert Xpress SARS-CoV-2/FLU/RSV testing.  Fact Sheet for  Patients: EntrepreneurPulse.com.au  Fact Sheet for Healthcare Providers: IncredibleEmployment.be  This test is not yet approved or cleared by the [place] FDA and has been authorized for detection and/or diagnosis of SARS-CoV-2 by FDA under an Emergency Use Authorization (EUA). This EUA will remain in effect (meaning this test can be used) for the duration of the COVID-19 declaration under Section 564(b)(1) of the Act, 21 U.S.C. section 360bbb-3(b)(1), unless the authorization is terminated or revoked.  Performed at [facility], [place] [address] [place], [place] [address]      Radiology Studies: DG Abd 1 View  Result Date: 05/17/2022 CLINICAL DATA:  Abdominal pain. EXAM: ABDOMEN - 1 VIEW COMPARISON:  Abdominal radiograph dated 09/12/2018. FINDINGS: Evaluation is limited due to body habitus. Moderate stool throughout the colon. No bowel dilatation or evidence of obstruction. No free air or radiopaque calculi. The osseous structures are intact. The soft tissues are grossly unremarkable. IMPRESSION: Moderate colonic stool burden. No bowel obstruction. Electronically Signed   By: Anner Crete M.D.   On: 05/17/2022 23:12   DG Chest Port 1 View  Result Date: 05/17/2022 CLINICAL DATA:  Shortness of breath, respiratory distress EXAM: PORTABLE CHEST 1 VIEW COMPARISON:  05/12/2022 FINDINGS: Cardiomegaly, vascular congestion. Interstitial prominence could reflect interstitial edema. No effusions or acute bony abnormality. Findings similar to prior study. IMPRESSION: Cardiomegaly with vascular congestion and probable early interstitial edema. Findings unchanged. Electronically Signed   By: Rolm Baptise M.D.   On: 05/17/2022 21:09     Scheduled Meds:  apixaban  5 mg Oral BID   carvedilol  12.5 mg Oral BID   finasteride  5 mg Oral Daily   furosemide  80 mg Intravenous Q12H   insulin aspart  0-9 Units Subcutaneous Q4H   lacosamide  100 mg Oral BID    levETIRAcetam  750 mg Oral BID   mometasone-formoterol  2 puff Inhalation BID   sodium chloride flush  3 mL Intravenous Q12H   Continuous Infusions:  sodium chloride     cefTRIAXone (ROCEPHIN)  IV Stopped (05/18/22 0220)     LOS: 1 day    Time spent: 14min  Domenic Polite, MD [facility]   05/18/2022, 12:03 PM

## 2022-05-18 NOTE — Evaluation (Signed)
Physical Therapy Evaluation Patient Details Name: Tim Bradley MRN: [id number] DOB: 10-03-1950 Today's Date: 05/18/2022  History of Present Illness  71 y.o. male presents to [facility] hospital from SNF on 05/17/2022 with SOB. 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    Clinical Impression  Pt in bed upon arrival of PT, agreeable to evaluation at this time. Prior to admission the pt was mobilizing with assist of SNF staff to complete transfers to and from [facility], but was largely mobilizing on WC level. The pt now presents with limitations in functional mobility, strength, power, ROM, and activity tolerance due to above dx and chronic debility, but full assessment limited at this time by pt declining OOB mobility attempts due to fatigue. In session, pt allowed for assessment of LE ROM and strength testing which is limited to partial ROM against gravity at this time due to weakness and significant swelling in BLE, able to pull to longsitting with min-modA of 2 and pt using bed rails. Will need to return to SNF for continued rehab when medically stable for d/c.        Recommendations for follow up therapy are one component of a multi-disciplinary discharge planning process, led by the attending physician.  Recommendations may be updated based on patient status, additional functional criteria and insurance authorization.  Follow Up Recommendations Skilled nursing-short term rehab (<3 hours/day) Can patient physically be transported by private vehicle: No    Assistance Recommended at Discharge Frequent or constant Supervision/Assistance  Patient can return home with the following  A lot of help with bathing/dressing/bathroom;Assistance with cooking/housework;Assist for transportation;Help with stairs or ramp for entrance;Two people to help with walking and/or transfers    Equipment Recommendations None recommended by PT   Recommendations for Other Services       Functional Status Assessment Patient has had a recent decline in their functional status and/or demonstrates limited ability to make significant improvements in function in a reasonable and predictable amount of time     Precautions / Restrictions Precautions Precautions: Fall Precaution Comments: watch O2, on 2.5 L during eval Restrictions Weight Bearing Restrictions: No      Mobility  Bed Mobility Overal bed mobility: Needs Assistance Bed Mobility: Supine to Sit     Supine to sit: Mod assist     General bed mobility comments: modA to pull to long-sitting in bed with pt using rails, conitnues to decline attempts to sit EOB at this time    Transfers                   General transfer comment: Pt refused this session due to fatigue         Pertinent Vitals/Pain Pain Assessment Pain Assessment: No/denies pain    Home Living Family/patient expects to be discharged to:: Skilled nursing facility                   Additional Comments: Pt at [facility]    Prior Function Prior Level of Function : Needs assist             Mobility Comments: needing +2 and/or stedy to stand ADLs Comments: Needs assist with all ADL's     Hand Dominance   Dominant Hand: Left    Extremity/Trunk Assessment   Upper Extremity Assessment Upper Extremity Assessment: Defer to OT evaluation    Lower Extremity Assessment Lower Extremity Assessment: Generalized weakness (significant edema bilaterally,  pt reports decreased sensation throughout LE but is able to accurately tell where he is touched. limited against gravity movement, unable to manage against resistance)    Cervical / Trunk Assessment Cervical / Trunk Assessment: Other exceptions Cervical / Trunk Exceptions: large body habitus  Communication   Communication: No difficulties  Cognition Arousal/Alertness: Awake/alert Behavior During Therapy: WFL for tasks  assessed/performed Overall Cognitive Status: Within Functional Limits for tasks assessed                                          General Comments General comments (skin integrity, edema, etc.): VSS on 2.5L O2    Exercises     Assessment/Plan    PT Assessment Patient needs continued PT services  PT Problem List Decreased strength;Decreased range of motion;Decreased activity tolerance;Decreased balance;Decreased mobility;Decreased coordination;Cardiopulmonary status limiting activity       PT Treatment Interventions DME instruction;Gait training;Stair training;Functional mobility training;Therapeutic activities;Therapeutic exercise;Balance training;Patient/family education    PT Goals (Current goals can be found in the Care Plan section)  Acute Rehab PT Goals Patient Stated Goal: return to SNF PT Goal Formulation: With patient Time For Goal Achievement: 06/01/22 Potential to Achieve Goals: Fair    Frequency Min 3X/week     Co-evaluation PT/OT/SLP Co-Evaluation/Treatment: Yes Reason for Co-Treatment: Complexity of the patient's impairments (multi-system involvement);Necessary to address cognition/behavior during functional activity;For patient/therapist safety;To address functional/ADL transfers PT goals addressed during session: Mobility/safety with mobility;Balance;Strengthening/ROM OT goals addressed during session: ADL's and self-care       AM-PAC PT "6 Clicks" Mobility  Outcome Measure Help needed turning from your back to your side while in a flat bed without using bedrails?: A Lot Help needed moving from lying on your back to sitting on the side of a flat bed without using bedrails?: A Lot Help needed moving to and from a bed to a chair (including a wheelchair)?: Total Help needed standing up from a chair using your arms (e.g., wheelchair or bedside chair)?: Total Help needed to walk in hospital room?: Total Help needed climbing 3-5 steps with a  railing? : Total 6 Click Score: 8    End of Session Equipment Utilized During Treatment: Oxygen Activity Tolerance: Patient tolerated treatment well Patient left: with call bell/phone within reach;in bed;with family/visitor present Nurse Communication: Mobility status;Need for lift equipment PT Visit Diagnosis: Unsteadiness on feet (R26.81);Muscle weakness (generalized) (M62.81);Difficulty in walking, not elsewhere classified (R26.2)    Time: 3664-4034 PT Time Calculation (min) (ACUTE ONLY): 25 min   Charges:   PT Evaluation $PT Eval Low Complexity: 1 Low          [name], PT, DPT   Acute Rehabilitation Department  Sandra Cockayne 05/18/2022, 11:33 AM

## 2022-05-18 NOTE — Progress Notes (Signed)
Pt in no distress at this time requiring bipap.  RT will cont to monitor. 

## 2022-05-18 NOTE — Evaluation (Signed)
Clinical/Bedside Swallow Evaluation Patient Details  Name: Tim Bradley MRN: [id number] Date of Birth: 07-08-1951  Today's Date: 05/18/2022 Time: SLP Start Time (ACUTE ONLY): 4917 SLP Stop Time (ACUTE ONLY): 9150 SLP Time Calculation (min) (ACUTE ONLY): 19 min  Past Medical History:  Past Medical History:  Diagnosis Date   Abscess 02/2017   LEFT GLUTEAL    Acid reflux    Alcohol abuse    Anemia    CHF (congestive heart failure) (HCC)    CKD (chronic kidney disease) stage 4, GFR 15-29 ml/min ([place]) 04/11/2020   Cocaine abuse ([place])    DDD (degenerative disc disease), lumbar    Diabetes mellitus without complication ([place])    Dyspnea    Enlarged prostate    Hypertension    Seizures ([place])    Spinal stenosis, lumbar    Past Surgical History:  Past Surgical History:  Procedure Laterality Date   BACK SURGERY     cervical x2   BIOPSY  08/21/2018   Procedure: BIOPSY;  Surgeon: Ronald Lobo, MD;  Location: [place];  Service: Endoscopy;;   ESOPHAGOGASTRODUODENOSCOPY (EGD) WITH PROPOFOL N/A 08/21/2018   Procedure: ESOPHAGOGASTRODUODENOSCOPY (EGD) WITH PROPOFOL;  Surgeon: Ronald Lobo, MD;  Location: [facility] ENDOSCOPY;  Service: Endoscopy;  Laterality: N/A;   INCISION AND DRAINAGE PERIRECTAL ABSCESS N/A 03/26/2017   Procedure: IRRIGATION AND DEBRIDEMENT PERIRECTAL ABSCESS;  Surgeon: Rolm Bookbinder, MD;  Location: [place];  Service: General;  Laterality: N/A;   HPI:  Pt is a 71 y.o. male who presented on 05/17/2022 with SOB. CXR 8/19: Cardiomegaly with vascular congestion and probable early interstitial edema. 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    Assessment / Plan / Recommendation  Clinical Impression  Pt was seen for bedside swallow evaluation with his niece present. Pt denied a history of dysphagia and pt's family reported that the pt has been demonstrating inconsistent coughing with p.o.  intake since admission. Oral mechanism exam was [facility] and pt was edentulous; pt owns dentures, but they are at the SNF and pt's niece reported that she will bring them today. Pt exhibited intermittent dyspnea during the evaluation which likely impacted his performance. He was seen during lunch which included a meal of crispy baked chicken, cabbage, mac and cheese, applesauce and thin liquids. Pt exhibited inconsistent throat clearing and coughing which appeared to correlated with intake of larger boluses and with regular textures. Frequency of symptoms appeared to increase during times when pt reported increasing dyspnea. Pt was educated regarding swallowing physiology, his increased risk of aspiration with dyspnea and the need to take rest breaks if he is becoming dyspneic. Pt verbalized understanding and agreement. A dysphagia 3 diet with thin liquids is recommended at this time. SLP will follow pt to assess tolerance and for instrumental assessment if clinically indicated. SLP Visit Diagnosis: Dysphagia, unspecified (R13.10)    Aspiration Risk  Mild aspiration risk    Diet Recommendation Dysphagia 3 (Mech soft);Thin liquid   Liquid Administration via: Cup;Straw Medication Administration: Whole meds with liquid Supervision: Patient able to self feed Compensations: Slow rate;Small sips/bites (rest breaks if dyspneic) Postural Changes: Seated upright at 90 degrees    Other  Recommendations Oral Care Recommendations: Oral care BID    Recommendations for follow up therapy are one component of a multi-disciplinary discharge planning process, led by the attending physician.  Recommendations may be updated based on patient status, additional functional criteria and insurance authorization.  Follow up Recommendations  (TBD)      Assistance Recommended at Discharge    Functional Status Assessment Patient has had a recent decline in their functional status and demonstrates the ability to make  significant improvements in function in a reasonable and predictable amount of time.  Frequency and Duration min 2x/week  2 weeks       Prognosis Prognosis for Safe Diet Advancement: Good      Swallow Study   General Date of Onset: 05/18/22 HPI: Pt is a 71 y.o. male who presented on 05/17/2022 with SOB. CXR 8/19: Cardiomegaly with vascular congestion and probable early interstitial edema. 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 Type of Study: Bedside Swallow Evaluation Previous Swallow Assessment: none Diet Prior to this Study: Regular;Thin liquids Temperature Spikes Noted: No Respiratory Status: Nasal cannula History of Recent Intubation: No Behavior/Cognition: Alert;Cooperative;Pleasant mood Oral Cavity Assessment: Within Functional Limits Oral Care Completed by SLP: No Oral Cavity - Dentition: Dentures, not available;Edentulous Vision: Functional for self-feeding Self-Feeding Abilities: Able to feed self Patient Positioning: Upright in bed;Postural control adequate for testing Baseline Vocal Quality: Normal Volitional Cough: Strong Volitional Swallow: Able to elicit    Oral/Motor/Sensory Function Overall Oral Motor/Sensory Function: Within functional limits   Ice Chips Ice chips: Not tested   Thin Liquid Thin Liquid: Impaired Presentation: Cup;Straw Pharyngeal  Phase Impairments: Throat Clearing - Delayed (occasionally)    Nectar Thick Nectar Thick Liquid: Not tested   Honey Thick Honey Thick Liquid: Not tested   Puree Puree: Impaired Presentation: Spoon Pharyngeal Phase Impairments: Throat Clearing - Delayed;Throat Clearing - Immediate   Solid     Solid: Impaired Presentation: Self Fed Pharyngeal Phase Impairments: Throat Clearing - Immediate;Throat Clearing - Delayed;Cough - Immediate;Cough - Delayed     Chaunice Obie I. Hardin Negus, [place], [place] Office  number Roy 05/18/2022,2:49 PM

## 2022-05-18 NOTE — Assessment & Plan Note (Addendum)
this patient has acute respiratory failure with Hypoxia  as documented by the presence of following: O2 saturatio< 90% on RA Likely due to:   CHF exacerbation,   Provide O2 therapy and titrate as needed  Continuous pulse ox Rest on Bipap for tonight

## 2022-05-18 NOTE — Assessment & Plan Note (Signed)
Reports abd pain and pressure Foley in place  UA showing multiple bacteria Obtain urine culture and cover with rocephin for tonight

## 2022-05-18 NOTE — Evaluation (Signed)
Occupational Therapy Evaluation Patient Details Name: Tim Bradley MRN: [id number] DOB: 01-01-1951 Today's Date: 05/18/2022   History of Present Illness 71 y.o. male presents to [facility] hospital on 05/17/2022 with SOB. PMH:  chronic systolic heart failure, CKD4, NSTEMI, hypertension, DVT , DM2, OSA, prostate ca, WC bound due to severe stenosis   Clinical Impression   Pt admitted for concerns listed above. PTA pt reported that he has been needing substantial assist with all transfers and ADL's at SNF. He is limited by SoB, weakness, and decreased activity tolerance. At this time, pt demonstrates UE ROM WFL and requires min A +2 plus heavy use of bed rails to long sit in bed. Recommending return to SNF to continue working on improving independence and safety. OT will follow acutely.       Recommendations for follow up therapy are one component of a multi-disciplinary discharge planning process, led by the attending physician.  Recommendations may be updated based on patient status, additional functional criteria and insurance authorization.   Follow Up Recommendations  Skilled nursing-short term rehab (<3 hours/day)    Assistance Recommended at Discharge Frequent or constant Supervision/Assistance  Patient can return home with the following Two people to help with walking and/or transfers;Two people to help with bathing/dressing/bathroom;Assistance with cooking/housework;Assistance with feeding;Direct supervision/assist for medications management;Direct supervision/assist for financial management;Assist for transportation    Functional Status Assessment  Patient has had a recent decline in their functional status and demonstrates the ability to make significant improvements in function in a reasonable and predictable amount of time.  Equipment Recommendations  None recommended by OT    Recommendations for Other Services       Precautions / Restrictions Precautions Precautions:  Fall Precaution Comments: watch O2 Restrictions Weight Bearing Restrictions: No      Mobility Bed Mobility Overal bed mobility: Needs Assistance             General bed mobility comments: Long sit in bed with min A +2 and pt pulling on bed railings - refused to sit EOB    Transfers                   General transfer comment: Pt refused this session due to fatigue      Balance                                           ADL either performed or assessed with clinical judgement   ADL Overall ADL's : Needs assistance/impaired Eating/Feeding: Set up;Sitting   Grooming: Set up;Sitting   Upper Body Bathing: Minimal assistance;Sitting   Lower Body Bathing: Maximal assistance;+2 for physical assistance;+2 for safety/equipment;Sitting/lateral leans;Sit to/from stand   Upper Body Dressing : Minimal assistance;Sitting   Lower Body Dressing: Maximal assistance;+2 for physical assistance;+2 for safety/equipment;Sit to/from stand;Sitting/lateral leans       Toileting- Clothing Manipulation and Hygiene: Maximal assistance;+2 for physical assistance;+2 for safety/equipment;Sit to/from stand;Sitting/lateral lean         General ADL Comments: Pt limited due to poor activity tolerance and weakness/balance deficits     Vision Baseline Vision/History: 0 No visual deficits Ability to See in Adequate Light: 0 Adequate Patient Visual Report: No change from baseline Vision Assessment?: No apparent visual deficits     Perception     Praxis      Pertinent Vitals/Pain Pain Assessment Pain Assessment: No/denies pain  Hand Dominance Left   Extremity/Trunk Assessment Upper Extremity Assessment Upper Extremity Assessment: Overall WFL for tasks assessed   Lower Extremity Assessment Lower Extremity Assessment: Defer to PT evaluation   Cervical / Trunk Assessment Cervical / Trunk Assessment: Normal   Communication Communication Communication: No  difficulties   Cognition Arousal/Alertness: Awake/alert Behavior During Therapy: WFL for tasks assessed/performed Overall Cognitive Status: Within Functional Limits for tasks assessed                                       General Comments  VSS on RA    Exercises     Shoulder Instructions      Home Living Family/patient expects to be discharged to:: Skilled nursing facility                                 Additional Comments: Pt at [facility]      Prior Functioning/Environment Prior Level of Function : Needs assist             Mobility Comments: needing +2 and/or stedy tostand ADLs Comments: Needs assist with all ADL's        OT Problem List: Decreased strength;Decreased range of motion;Decreased activity tolerance;Impaired balance (sitting and/or standing);Decreased cognition;Decreased safety awareness;Decreased knowledge of use of DME or AE;Cardiopulmonary status limiting activity      OT Treatment/Interventions: Self-care/ADL training;Therapeutic exercise;DME and/or AE instruction;Therapeutic activities;Cognitive remediation/compensation;Patient/family education;Balance training    OT Goals(Current goals can be found in the care plan section) Acute Rehab OT Goals Patient Stated Goal: To breath better OT Goal Formulation: With patient Time For Goal Achievement: 06/01/22 Potential to Achieve Goals: Good ADL Goals Pt Will Perform Lower Body Bathing: with mod assist;with adaptive equipment;sitting/lateral leans;sit to/from stand Pt Will Perform Lower Body Dressing: with mod assist;sitting/lateral leans;sit to/from stand;with adaptive equipment Pt Will Transfer to Toilet: with mod assist;with +2 assist;stand pivot transfer Pt/caregiver will Perform Home Exercise Program: Increased strength;Both right and left upper extremity;With theraband Additional ADL Goal #1: Pt will complete all aspects of bed mobility with min A as a precursor to  seated ADL's  OT Frequency: Min 2X/week    Co-evaluation PT/OT/SLP Co-Evaluation/Treatment: Yes Reason for Co-Treatment: For patient/therapist safety;To address functional/ADL transfers   OT goals addressed during session: ADL's and self-care      AM-PAC OT "6 Clicks" Daily Activity     Outcome Measure Help from another person eating meals?: A Little Help from another person taking care of personal grooming?: A Little Help from another person toileting, which includes using toliet, bedpan, or urinal?: Total Help from another person bathing (including washing, rinsing, drying)?: A Lot Help from another person to put on and taking off regular upper body clothing?: A Little Help from another person to put on and taking off regular lower body clothing?: A Lot 6 Click Score: 14   End of Session Equipment Utilized During Treatment: Oxygen Nurse Communication: Mobility status  Activity Tolerance: Patient limited by fatigue Patient left: in bed;with call bell/phone within reach;with family/visitor present  OT Visit Diagnosis: Unsteadiness on feet (R26.81);Other abnormalities of gait and mobility (R26.89);Muscle weakness (generalized) (M62.81)                Time: 9147-8295 OT Time Calculation (min): 26 min Charges:  OT General Charges $OT Visit: 1 Visit OT Evaluation $OT Eval Moderate Complexity: 1 Mod  Dianna Ewald H., OTR/L Acute Rehabilitation  Phillis Thackeray Elane Yolanda Bonine 05/18/2022, 11:12 AM

## 2022-05-18 NOTE — ED Notes (Signed)
Lab called to report that pts labs were hemolyzed - Phlebotomy asked to try and collect labs - Lab reports they need 2 gold and 1LG top

## 2022-05-18 NOTE — ED Notes (Signed)
Pt tolerated taking bipap off briefly to take Eliquis and use mouth swab. Pt repositioned on his side per request and RT at bedside to help adjust pt bipap. Pt now resting

## 2022-05-18 NOTE — ED Notes (Signed)
Per MD okay to see if pt can tolerate taking off bipap briefly to take his eliquis

## 2022-05-19 DIAGNOSIS — I5043 Acute on chronic combined systolic (congestive) and diastolic (congestive) heart failure: Secondary | ICD-10-CM | POA: Diagnosis not present

## 2022-05-19 LAB — CBC
HCT: 30.4 % — ABNORMAL LOW (ref 39.0–52.0)
Hemoglobin: 9.6 g/dL — ABNORMAL LOW (ref 13.0–17.0)
MCH: 27.8 pg (ref 26.0–34.0)
MCHC: 31.6 g/dL (ref 30.0–36.0)
MCV: 88.1 fL (ref 80.0–100.0)
Platelets: 245 K/uL (ref 150–400)
RBC: 3.45 MIL/uL — ABNORMAL LOW (ref 4.22–5.81)
RDW: 14.6 % (ref 11.5–15.5)
WBC: 4.4 K/uL (ref 4.0–10.5)
nRBC: 0 % (ref 0.0–0.2)

## 2022-05-19 LAB — BASIC METABOLIC PANEL WITH GFR
Anion gap: 8 (ref 5–15)
BUN: 80 mg/dL — ABNORMAL HIGH (ref 8–23)
CO2: 23 mmol/L (ref 22–32)
Calcium: 8.9 mg/dL (ref 8.9–10.3)
Chloride: 105 mmol/L (ref 98–111)
Creatinine, Ser: 3.6 mg/dL — ABNORMAL HIGH (ref 0.61–1.24)
GFR, Estimated: 17 mL/min — ABNORMAL LOW
Glucose, Bld: 117 mg/dL — ABNORMAL HIGH (ref 70–99)
Potassium: 5 mmol/L (ref 3.5–5.1)
Sodium: 136 mmol/L (ref 135–145)

## 2022-05-19 LAB — GLUCOSE, CAPILLARY
Glucose-Capillary: 102 mg/dL — ABNORMAL HIGH (ref 70–99)
Glucose-Capillary: 147 mg/dL — ABNORMAL HIGH (ref 70–99)
Glucose-Capillary: 210 mg/dL — ABNORMAL HIGH (ref 70–99)
Glucose-Capillary: 145 mg/dL — ABNORMAL HIGH (ref 70–99)

## 2022-05-19 MED ORDER — ISOSORB DINITRATE-HYDRALAZINE 20-37.5 MG PO TABS
1.0000 | ORAL_TABLET | Freq: Three times a day (TID) | ORAL | Status: DC
  Administered 2022-05-19 – 2022-05-26 (×24): 1 via ORAL
  Filled 2022-05-19 (×24): qty 1

## 2022-05-19 NOTE — Plan of Care (Signed)
  Problem: Education: Goal: Ability to demonstrate management of disease process will improve Outcome: Progressing Goal: Ability to verbalize understanding of medication therapies will improve Outcome: Progressing Goal: Individualized Educational Video(s) Outcome: Progressing   Problem: Activity: Goal: Capacity to carry out activities will improve Outcome: Progressing   Problem: Cardiac: Goal: Ability to achieve and maintain adequate cardiopulmonary perfusion will improve Outcome: Progressing   Problem: Education: Goal: Ability to describe self-care measures that may prevent or decrease complications (Diabetes Survival Skills Education) will improve Outcome: Progressing Goal: Individualized Educational Video(s) Outcome: Progressing   Problem: Metabolic: Goal: Ability to maintain appropriate glucose levels will improve Outcome: Progressing   Problem: Nutritional: Goal: Maintenance of adequate nutrition will improve Outcome: Progressing Goal: Progress toward achieving an optimal weight will improve Outcome: Progressing   Problem: Skin Integrity: Goal: Risk for impaired skin integrity will decrease Outcome: Progressing   Problem: Tissue Perfusion: Goal: Adequacy of tissue perfusion will improve Outcome: Progressing   Problem: Education: Goal: Knowledge of General Education information will improve Description: Including pain rating scale, medication(s)/side effects and non-pharmacologic comfort measures Outcome: Progressing   Problem: Health Behavior/Discharge Planning: Goal: Ability to manage health-related needs will improve Outcome: Progressing   Problem: Clinical Measurements: Goal: Ability to maintain clinical measurements within normal limits will improve Outcome: Progressing Goal: Will remain free from infection Outcome: Progressing Goal: Diagnostic test results will improve Outcome: Progressing Goal: Respiratory complications will improve Outcome:  Progressing Goal: Cardiovascular complication will be avoided Outcome: Progressing   Problem: Activity: Goal: Risk for activity intolerance will decrease Outcome: Progressing   Problem: Nutrition: Goal: Adequate nutrition will be maintained Outcome: Progressing   Problem: Coping: Goal: Level of anxiety will decrease Outcome: Progressing   Problem: Elimination: Goal: Will not experience complications related to bowel motility Outcome: Progressing Goal: Will not experience complications related to urinary retention Outcome: Progressing   Problem: Pain Managment: Goal: General experience of comfort will improve Outcome: Progressing   Problem: Safety: Goal: Ability to remain free from injury will improve Outcome: Progressing   Problem: Skin Integrity: Goal: Risk for impaired skin integrity will decrease Outcome: Progressing

## 2022-05-19 NOTE — Consult Note (Signed)
Tim Bradley Admit Date: 05/17/2022 05/19/2022 Tim Bradley Requesting Physician:  Fanny Bien MD  Reason for Consult:  AoCKD4, Vol O/L,   HPI:  69M admitted 8/19 after presenting with increased peripheral edema, abdominal distention, dyspnea.  [facility], HFrEF with LVEF 35 to 40%, history of DVT, history of seizures chronic SNF patient, DM 2, hypertension, COPD, prostate cancer, bed/wheelchair dependent.    Patient being treated for acute on chronic HFrEF exacerbation, Lasix 80 mg IV twice daily  Presenting creatinine 3.46, increased to 3.6 today, recent baseline creatinine around 3.0.  Patient states that he still has dyspnea.  He has made 1.6 L of urine in the past 24 hours and is about 3 L negative since presentation.  He has had recurrent hospitalizations for this.  Blood pressures are stable.  He requires supplemental oxygen, 3 L nasal cannula.    He states that he would rather die than receive dialysis.   Creatinine, Ser (mg/dL)  Date Value  05/19/2022 3.60 (H)  05/18/2022 3.33 (H)  05/17/2022 3.46 (H)  05/15/2022 3.39 (H)  05/14/2022 3.16 (H)  05/13/2022 3.26 (H)  05/12/2022 3.03 (H)  05/11/2022 2.60 (H)  05/01/2022 2.92 (H)  04/30/2022 2.80 (H)  ] I/Os: I/O last 3 completed shifts: In: 229.7 [IV Piggyback:229.7] Out: 3200 [Urine:3200]   ROS NSAIDS: No exposure IV Contrast no exposure TMP/SMX no exposure Hypotension not present Balance of 12 systems is negative w/ exceptions as above  PMH  Past Medical History:  Diagnosis Date   Abscess 02/2017   LEFT GLUTEAL    Acid reflux    Alcohol abuse    Anemia    CHF (congestive heart failure) (HCC)    CKD (chronic kidney disease) stage 4, GFR 15-29 ml/min (HCC) 04/11/2020   Cocaine abuse ([place])    DDD (degenerative disc disease), lumbar    Diabetes mellitus without complication ([place])    Dyspnea    Enlarged prostate    Hypertension    Seizures ([place])    Spinal stenosis, lumbar     PSH  Past Surgical History:  Procedure Laterality Date   BACK SURGERY     cervical x2   BIOPSY  08/21/2018   Procedure: BIOPSY;  Surgeon: Ronald Lobo, MD;  Location: [place];  Service: Endoscopy;;   ESOPHAGOGASTRODUODENOSCOPY (EGD) WITH PROPOFOL N/A 08/21/2018   Procedure: ESOPHAGOGASTRODUODENOSCOPY (EGD) WITH PROPOFOL;  Surgeon: Ronald Lobo, MD;  Location: [place];  Service: Endoscopy;  Laterality: N/A;   INCISION AND DRAINAGE PERIRECTAL ABSCESS N/A 03/26/2017   Procedure: IRRIGATION AND DEBRIDEMENT PERIRECTAL ABSCESS;  Surgeon: Rolm Bookbinder, MD;  Location: [place];  Service: General;  Laterality: N/A;   FH  Family History  Problem Relation Age of Onset   Hypertension Mother    Cancer - Lung Father    SH  reports that he quit smoking about 5 years ago. His smoking use included cigarettes. He has a 10.00 pack-year smoking history. He has never used smokeless tobacco. He reports that he does not currently use alcohol after a past usage of about 12.0 standard drinks of alcohol per week. He reports that he does not currently use drugs after having used the following drugs: Cocaine and Marijuana. Allergies  Allergies  Allergen Reactions   Gabapentin Other (See Comments)    unknown   Tramadol Other (See Comments)    unknown   Trazodone And Nefazodone Other (See Comments)    Family reports seizures from this medication  Home medications Prior to Admission medications   Medication Sig Start Date End Date Taking? Authorizing Provider  acetaminophen (TYLENOL) 500 MG tablet Take 1,000 mg by mouth 3 (three) times daily as needed for mild pain.   Yes [provider]  apixaban (ELIQUIS) 5 MG TABS tablet Take 1 tablet (5 mg total) by mouth 2 (two) times daily. 01/10/22  Yes Horton, Alvin Critchley, DO  atorvastatin (LIPITOR) 80 MG tablet Take 1 tablet (80 mg total) by mouth daily. Patient taking differently: Take 80 mg by mouth every evening. 01/10/22  Yes Horton,  Alvin Critchley, DO  carvedilol (COREG) 12.5 MG tablet Take 1 tablet (12.5 mg total) by mouth 2 (two) times daily. 04/30/22 05/30/22 Yes Arrien, Jimmy Picket, MD  Cholecalciferol (QC VITAMIN D3) 50 MCG (2000 UT) TABS Take 2,000 Units by mouth daily.   Yes [provider]  finasteride (PROSCAR) 5 MG tablet Take 1 tablet (5 mg total) by mouth daily. 05/16/22  Yes Domenic Polite, MD  furosemide (LASIX) 20 MG tablet Take 3 tablets (60 mg total) by mouth daily. 05/15/22 06/14/22 Yes Domenic Polite, MD  guaiFENesin (ROBITUSSIN) 100 MG/5ML liquid Take 200 mg by mouth every 4 (four) hours as needed for cough or to loosen phlegm. Give 40mL (equal to 200mg ) by mouth every four hours as needed for cough x 72 hours. **up to 2400mg  24 hours** 05/11/22  Yes [provider]  HYDROcodone-acetaminophen (NORCO/VICODIN) 5-325 MG tablet Take 1 tablet by mouth 2 (two) times daily as needed for moderate pain. 1 tablet by mouth twice every day as needed x 14 days for breakthrough pain. 05/15/22  Yes Domenic Polite, MD  ipratropium-albuterol (DUONEB) 0.5-2.5 (3) MG/3ML SOLN Take 3 mLs by nebulization every 4 (four) hours as needed (shortness of breath/wheezing).   Yes [provider]  isosorbide-hydrALAZINE (BIDIL) 20-37.5 MG tablet Take 2 tablets by mouth 3 (three) times daily. 05/15/22 06/14/22 Yes Domenic Polite, MD  Lacosamide 100 MG TABS Take 100 mg by mouth 2 (two) times daily.   Yes [provider]  levETIRAcetam (KEPPRA) 750 MG tablet Take 1 tablet (750 mg total) by mouth 2 (two) times daily. 02/21/22  Yes Ghimire, Henreitta Leber, MD  Magnesium Oxide 420 MG TABS Take 420 mg by mouth daily.   Yes [provider]  melatonin 3 MG TABS tablet Take 1 tablet (3 mg total) by mouth at bedtime. 05/15/22  Yes Domenic Polite, MD  mometasone-formoterol [facility]) 200-5 MCG/ACT AERO Inhale 2 puffs into the lungs 2 (two) times daily. 01/31/22 07/05/22 Yes Pahwani, Einar Grad, MD  naloxone [facility]) nasal spray 4  mg/0.1 mL Place 1 spray into the nose as needed (opioid overdose).   Yes [provider]  tizanidine (ZANAFLEX) 2 MG capsule Take 1 capsule (2 mg total) by mouth 2 (two) times daily as needed for up to 5 days for muscle spasms. 05/15/22 05/20/22 Yes Domenic Polite, MD    Current Medications Scheduled Meds:  apixaban  5 mg Oral BID   carvedilol  12.5 mg Oral BID   finasteride  5 mg Oral Daily   furosemide  80 mg Intravenous Q12H   insulin aspart  0-9 Units Subcutaneous Q4H   isosorbide-hydrALAZINE  1 tablet Oral TID   lacosamide  100 mg Oral BID   levETIRAcetam  750 mg Oral BID   mometasone-formoterol  2 puff Inhalation BID   senna-docusate  1 tablet Oral BID   sodium chloride flush  3 mL Intravenous Q12H   Continuous Infusions:  sodium  chloride     cefTRIAXone (ROCEPHIN)  IV Stopped (05/18/22 2205)   PRN Meds:.sodium chloride, acetaminophen **OR** acetaminophen, bisacodyl, ipratropium-albuterol, melatonin, polyethylene glycol, sodium chloride flush  CBC Recent Labs  Lab 05/17/22 2050 05/17/22 2251 05/18/22 0554 05/19/22 0735  WBC 5.0  --  4.9 4.4  NEUTROABS 2.7  --   --   --   HGB 10.5* 11.2* 10.1* 9.6*  HCT 34.2* 33.0* 32.3* 30.4*  MCV 91.2  --  89.7 88.1  PLT 289  --  305 143   Basic Metabolic Panel Recent Labs  Lab 05/13/22 0350 05/14/22 0653 05/15/22 0413 05/17/22 2050 05/17/22 2251 05/18/22 1125 05/19/22 0735  NA 139 138 140 136 136 136 136  K 5.0 4.7 4.7 5.3* 6.2* 4.8 5.0  CL 107 107 107 104  --  104 105  CO2 24 24 24 23   --  23 23  GLUCOSE 127* 127* 142* 116*  --  184* 117*  BUN 60* 60* 63* 68*  --  71* 80*  CREATININE 3.26* 3.16* 3.39* 3.46*  --  3.33* 3.60*  CALCIUM 8.9 9.1 9.1 9.1  --  9.2 8.9  PHOS  --   --   --   --   --  5.1*  --     Physical Exam   Blood pressure (!) 121/99, pulse (!) 55, temperature 98.7 F (37.1 C), temperature source Oral, resp. rate 20, height 6\' 2"  (1.88 m), weight 127.4 kg, SpO2 96 %. GEN: Chronically  ill-appearing, lying in bed, resting comfortably ENT: NCAT, poor dentition EYES: EOMI CV: Regular, normal S1 and S2 PULM: Diminished in the bases ABD: Distended, nontender SKIN: No rashes or lesions EXT: 2-3+ edema bilaterally in the legs and thighs  Assessment 19M AoCKD4, AoC HFrEF, chronic illness requiring bed/wheelchair  AoCKD4: Likely due to diuretics and decompensated heart failure, need to achieve euvolemia.  At least today he states he would not receive dialysis even if needed, seems to be a marginal candidate for dialysis, at best.  Follows with the [facility] nephrology. AoC HFrEF, congested.  Recurrent.  Continue Lasix 80 IV twice daily, might need to increase dosing.  Continue sodium restriction. Hypertension, blood pressure stable DM 2 COPD Anemia, mild Hx/o DVT on DOAC  Plan As above Diurese Daily assessments Cont to discuss his thoughts on HD, but I am not sure he is a candidate Daily weights, Daily Renal Panel, Strict I/Os, Avoid nephrotoxins (NSAIDs, judicious IV Contrast)   Tim Bradley  05/19/2022, 2:24 PM

## 2022-05-19 NOTE — Progress Notes (Signed)
PROGRESS NOTE    Tim Bradley  KGS:[id number] DOB: Jul 13, 1951 DOA: 05/17/2022 PCP: [facility]  Tim Bradley is a 71 y.o. resident of SNF with chronic systolic CHF, EF 58-59%, CKD stage IV, DVT, hypertension, T2DM, COPD, prostate CA, bed and wheelchair bound  just discharged from [facility] after treatment for CHF, he was 6.5 L negative and discharged on furosemide 80 Mg daily  -Back from [place] with increased dyspnea, swelling and abdominal tightness -Chest x-ray with cardiomegaly,?  Early edema, blood pressure in the 200s initially, improved after nitro and Lasix  Subjective: -Short of breath last night, required BiPAP, now improving  Assessment and Plan:  Acute on chronic systolic CHF (congestive heart failure) (HCC) -Known cardiomyopathy, EF 35-40% -Recurrent admissions with CHF, further complicated by CKD 4, followed at the [facility] -Reports compliance with medications, he was 6.5 L negative at the time of recent discharge, he is 2.9 L negative  -Continue carvedilol and BiDil  -Consult nephrology, followed at the [facility] but hasn't been seen recently -Add nightly BiPAP now and at SNF -GDMT limited by CKD 4 -Importance of compliance with diet again emphasized -BMP in a.m.   AKI on chronic kidney disease stage IV.  -Baseline creatinine around 3, now 3.6, remains volume overloaded-Diuretics as noted above  ?  UTI -Abnormal UA, marginal symptoms, continue ceftriaxone, urine culture with GNR 100 K  Hypertensive urgency -Improving, continue Coreg and BiDil   Diabetes mellitus without complication (HCC) -CBGs are stable, continue sliding scale   Chronic deep vein thrombosis (DVT) (HCC) Right lower extremity DVT -Continue apixaban   COPD (chronic obstructive pulmonary disease) (HCC) -Continue bronchodilator therapy, no wheezing   Debility, bed and wheelchair bound -Poor prognosis -Long-term SNF resident   Seizures ([place]) -Stable, continue with Vimpat and keppra.    Class 2  obesity Calculated BMI is 35,3    DVT prophylaxis:  heparin subcutaneous Code Status: DNR Family Communication: Discussed with niece at bedside yesterday Dispo: Back to SNF when stable  Consultants:    Procedures:   Antimicrobials:    Objective: Vitals:   05/18/22 2338 05/19/22 0328 05/19/22 0402 05/19/22 0825  BP: 125/76  105/67   Pulse: 67  66 (!) 55  Resp: 19  17   Temp: 98.4 F (36.9 C)  98.6 F (37 C)   TempSrc: Oral  Oral   SpO2: 90%  92% 96%  Weight:  127.4 kg    Height:        Intake/Output Summary (Last 24 hours) at 05/19/2022 1016 Last data filed at 05/19/2022 0400 Gross per 24 hour  Intake 200.34 ml  Output 1600 ml  Net -1399.66 ml   Filed Weights   05/17/22 2053 05/18/22 1106 05/19/22 0328  Weight: 122.9 kg 126.7 kg 127.4 kg    Examination:  General exam: Chronically ill male laying in bed, somnolent but easily arousable, oriented x2, no distress HEENT: Positive JVD CVS: S1-S2, regular rhythm Lungs: basilar rales Abdomen: Soft, nontender, bowel sounds present Extremities: 2+ edema, extending to upper thighs Skin: No rashes on exposed skin Psychiatry:  Mood & affect appropriate.     Data Reviewed:   CBC: Recent Labs  Lab 05/17/22 2050 05/17/22 2251 05/18/22 0554 05/19/22 0735  WBC 5.0  --  4.9 4.4  NEUTROABS 2.7  --   --   --   HGB 10.5* 11.2* 10.1* 9.6*  HCT 34.2* 33.0* 32.3* 30.4*  MCV 91.2  --  89.7 88.1  PLT 289  --  305  161   Basic Metabolic Panel: Recent Labs  Lab 05/14/22 0653 05/15/22 0413 05/17/22 2050 05/17/22 2251 05/18/22 1125 05/19/22 0735  NA 138 140 136 136 136 136  K 4.7 4.7 5.3* 6.2* 4.8 5.0  CL 107 107 104  --  104 105  CO2 24 24 23   --  23 23  GLUCOSE 127* 142* 116*  --  184* 117*  BUN 60* 63* 68*  --  71* 80*  CREATININE 3.16* 3.39* 3.46*  --  3.33* 3.60*  CALCIUM 9.1 9.1 9.1  --  9.2 8.9  MG  --   --   --   --  2.1  --   PHOS  --   --   --   --  5.1*  --    GFR: Estimated Creatinine Clearance:  25.9 mL/min (A) (by C-G formula based on SCr of 3.6 mg/dL (H)). Liver Function Tests: No results for input(s): "AST", "ALT", "ALKPHOS", "BILITOT", "PROT", "ALBUMIN" in the last 168 hours.  No results for input(s): "LIPASE", "AMYLASE" in the last 168 hours. No results for input(s): "AMMONIA" in the last 168 hours. Coagulation Profile: No results for input(s): "INR", "PROTIME" in the last 168 hours. Cardiac Enzymes: No results for input(s): "CKTOTAL", "CKMB", "CKMBINDEX", "TROPONINI" in the last 168 hours. BNP (last 3 results) No results for input(s): "PROBNP" in the last 8760 hours. HbA1C: No results for input(s): "HGBA1C" in the last 72 hours. CBG: Recent Labs  Lab 05/18/22 1154 05/18/22 1617 05/18/22 2025 05/18/22 2337 05/19/22 0402  GLUCAP 171* 153* 163* 102* 147*   Lipid Profile: No results for input(s): "CHOL", "HDL", "LDLCALC", "TRIG", "CHOLHDL", "LDLDIRECT" in the last 72 hours. Thyroid Function Tests: Recent Labs    05/17/22 2240  TSH 0.761   Anemia Panel: Recent Labs    05/17/22 2240 05/18/22 1125  VITAMINB12  --  229  FOLATE  --  9.4  TIBC  --  344  IRON  --  85  RETICCTPCT 1.8  --    Urine analysis:    Component Value Date/Time   COLORURINE YELLOW 05/17/2022 2240   APPEARANCEUR CLOUDY (A) 05/17/2022 2240   LABSPEC 1.015 05/17/2022 2240   PHURINE 6.0 05/17/2022 2240   GLUCOSEU NEGATIVE 05/17/2022 2240   HGBUR TRACE (A) 05/17/2022 2240   BILIRUBINUR NEGATIVE 05/17/2022 2240   KETONESUR NEGATIVE 05/17/2022 2240   PROTEINUR 100 (A) 05/17/2022 2240   UROBILINOGEN 0.2 03/11/2012 2012   NITRITE NEGATIVE 05/17/2022 2240   LEUKOCYTESUR LARGE (A) 05/17/2022 2240   Sepsis Labs: @LABRCNTIP (procalcitonin:4,lacticidven:4)  ) Recent Results (from the past 240 hour(s))  Resp Panel by RT-PCR (Flu A&B, Covid) Anterior Nasal Swab     Status: None   Collection Time: 05/11/22 12:39 PM   Specimen: Anterior Nasal Swab  Result Value Ref Range Status   SARS  Coronavirus 2 by RT PCR NEGATIVE NEGATIVE Final    Comment: (NOTE) SARS-CoV-2 target nucleic acids are NOT DETECTED.  The SARS-CoV-2 RNA is generally detectable in upper respiratory specimens during the acute phase of infection. The lowest concentration of SARS-CoV-2 viral copies this assay can detect is 138 copies/mL. A negative result does not preclude SARS-Cov-2 infection and should not be used as the sole basis for treatment or other patient management decisions. A negative result may occur with  improper specimen collection/handling, submission of specimen other than nasopharyngeal swab, presence of viral mutation(s) within the areas targeted by this assay, and inadequate number of viral copies(<138 copies/mL). A negative result must be combined  with clinical observations, patient history, and epidemiological information. The expected result is Negative.  Fact Sheet for Patients:  EntrepreneurPulse.com.au  Fact Sheet for Healthcare Providers:  IncredibleEmployment.be  This test is no t yet approved or cleared by the [place] FDA and  has been authorized for detection and/or diagnosis of SARS-CoV-2 by FDA under an Emergency Use Authorization (EUA). This EUA will remain  in effect (meaning this test can be used) for the duration of the COVID-19 declaration under Section 564(b)(1) of the Act, 21 U.S.C.section 360bbb-3(b)(1), unless the authorization is terminated  or revoked sooner.       Influenza A by PCR NEGATIVE NEGATIVE Final   Influenza B by PCR NEGATIVE NEGATIVE Final    Comment: (NOTE) The Xpert Xpress SARS-CoV-2/FLU/RSV plus assay is intended as an aid in the diagnosis of influenza from Nasopharyngeal swab specimens and should not be used as a sole basis for treatment. Nasal washings and aspirates are unacceptable for Xpert Xpress SARS-CoV-2/FLU/RSV testing.  Fact Sheet for  Patients: EntrepreneurPulse.com.au  Fact Sheet for Healthcare Providers: IncredibleEmployment.be  This test is not yet approved or cleared by the [place] FDA and has been authorized for detection and/or diagnosis of SARS-CoV-2 by FDA under an Emergency Use Authorization (EUA). This EUA will remain in effect (meaning this test can be used) for the duration of the COVID-19 declaration under Section 564(b)(1) of the Act, 21 U.S.C. section 360bbb-3(b)(1), unless the authorization is terminated or revoked.  Performed at [facility], [place] [address], [place], [place] [address]   Urine Culture     Status: Abnormal (Preliminary result)   Collection Time: 05/17/22 10:19 PM   Specimen: Urine, Catheterized  Result Value Ref Range Status   Specimen Description URINE, CATHETERIZED  Final   Special Requests   Final    NONE Performed at [facility], [place] [address], [place], [place] [address]    Culture >=100,000 COLONIES/mL GRAM NEGATIVE RODS (A)  Final   Report Status PENDING  Incomplete     Radiology Studies: DG Abd 1 View  Result Date: 05/17/2022 CLINICAL DATA:  Abdominal pain. EXAM: ABDOMEN - 1 VIEW COMPARISON:  Abdominal radiograph dated 09/12/2018. FINDINGS: Evaluation is limited due to body habitus. Moderate stool throughout the colon. No bowel dilatation or evidence of obstruction. No free air or radiopaque calculi. The osseous structures are intact. The soft tissues are grossly unremarkable. IMPRESSION: Moderate colonic stool burden. No bowel obstruction. Electronically Signed   By: Anner Crete M.D.   On: 05/17/2022 23:12   DG Chest Port 1 View  Result Date: 05/17/2022 CLINICAL DATA:  Shortness of breath, respiratory distress EXAM: PORTABLE CHEST 1 VIEW COMPARISON:  05/12/2022 FINDINGS: Cardiomegaly, vascular congestion. Interstitial prominence could reflect interstitial edema. No effusions or acute bony abnormality. Findings  similar to prior study. IMPRESSION: Cardiomegaly with vascular congestion and probable early interstitial edema. Findings unchanged. Electronically Signed   By: Rolm Baptise M.D.   On: 05/17/2022 21:09     Scheduled Meds:  apixaban  5 mg Oral BID   carvedilol  12.5 mg Oral BID   carvedilol  12.5 mg Oral BID WC   finasteride  5 mg Oral Daily   furosemide  80 mg Intravenous Q12H   insulin aspart  0-9 Units Subcutaneous Q4H   isosorbide-hydrALAZINE  1 tablet Oral TID   lacosamide  100 mg Oral BID   levETIRAcetam  750 mg Oral BID   mometasone-formoterol  2 puff Inhalation BID   senna-docusate  1 tablet Oral BID   sodium chloride flush  3 mL Intravenous Q12H   Continuous Infusions:  sodium chloride     cefTRIAXone (ROCEPHIN)  IV Stopped (05/18/22 2205)     LOS: 2 days    Time spent: 62min  Domenic Polite, MD [facility]   05/19/2022, 10:16 AM

## 2022-05-19 NOTE — Progress Notes (Signed)
Heart Failure Navigator Progress Note  Following this hospitalization to assess for HV TOC readiness.   EF 35-40% Patient readmitted from recent d'c 05/15/22 A HF TOC appointment is scheduled for 05/26/22. Will continue to watch  Tim Bradley, BSN, RN Heart Failure Leisure centre manager Chat Only

## 2022-05-19 NOTE — Progress Notes (Signed)
Initial Nutrition Assessment  DOCUMENTATION CODES:   Obesity unspecified  INTERVENTION:  Encourage adequate PO intake Nighttime snacks daily  "Low Sodium Nutrition Therapy" handout added to AVS  NUTRITION DIAGNOSIS:   Increased nutrient needs related to acute illness as evidenced by estimated needs.  GOAL:   Patient will meet greater than or equal to 90% of their needs  MONITOR:   PO intake, Supplement acceptance, Labs, Diet advancement, Weight trends, I & O's  REASON FOR ASSESSMENT:   Consult Assessment of nutrition requirement/status  ASSESSMENT:   Pt admitted from [place] with SOB r/t acute on chronic combined CHF. PMH significant for CHF, EF 35%, CKD stage IV, DVT, HTN, T2DM, bed/wheelchair bound, COPD, malignant neoplasm.   SLP following as pt at increased risk for aspiration d/t dyspnea. Currently recommend dysphagia 3 diet with thin liquids.   Pt sitting in bed eating a tootsie pop at time of visit. His sister at bedside. He reports that he has been eating really well prior to and during admission. He states that he has been eating 3 meals per day at [facility] He usually has an omelette and pancakes for breakfast. Lunch is usually a noodle soup. Dinner usually consists of a meat such as brisket, vegetables or various other sides.   Pt would benefit from the addition of night time snacks to optimize nutritional intake.   He reports that PTA he was on a 1259ml fluid restriction at his facility. Noted pt is not currently on a fluid restriction during admission.   He did not state his usual weight but denies recent significant weight loss. There is limited documentation of weight history within the last year, however his weight is noted to be elevated since 02/20/22. He has a diagnosis of CHF which is likely leading to weight fluctuations. During last admit pt had 6.4L diuresed.   Answered questions about dysphagia diet compliance as his sister was asking if she could  bring certain foods such as onions, peppers and blueberries. Also briefly explained importance and reason behind monitoring and limiting sodium intake to prevent further edema.   Edema: very deep pitting BLE  Medications: lasix, SSI 0-9 units q4h, senna, IV abx  Labs: BUN 80, Cr 3.60, phos 5.1(H), GFR 17, HgbA1c 6.9%, CBG's 102-171 x24 hours  UOP: 1.6L x24 hours I/O's: -2974ml since admission  NUTRITION - FOCUSED PHYSICAL EXAM:  Flowsheet Row Most Recent Value  Orbital Region No depletion  Upper Arm Region No depletion  Thoracic and Lumbar Region No depletion  Buccal Region No depletion  Temple Region No depletion  Clavicle Bone Region No depletion  Clavicle and Acromion Bone Region No depletion  Scapular Bone Region No depletion  Dorsal Hand No depletion  Patellar Region No depletion  Anterior Thigh Region No depletion  Posterior Calf Region No depletion  Edema (RD Assessment) Severe  [very deep pitting BLE]  Hair Reviewed  Eyes Reviewed  Mouth Reviewed  Skin Reviewed  Nails Reviewed       Diet Order:   Diet Order             DIET DYS 3 Room service appropriate? Yes with Assist; Fluid consistency: Thin  Diet effective now                   EDUCATION NEEDS:   Education needs have been addressed  Skin:  Skin Assessment: Reviewed RN Assessment  Last BM:  8/20 (type 4)  Height:   Ht Readings from Last 1 Encounters:  05/18/22 6\' 2"  (1.88 m)    Weight:   Wt Readings from Last 1 Encounters:  05/19/22 127.4 kg    Ideal Body Weight:  86.4 kg  BMI:  Body mass index is 36.06 kg/m.  Estimated Nutritional Needs:   Kcal:  2100-2300  Protein:  105-120g  Fluid:  >/=2L  Clayborne Dana, RDN, LDN Clinical Nutrition

## 2022-05-19 NOTE — Progress Notes (Signed)
Pt. Refused bipap. RN aware.

## 2022-05-19 NOTE — Progress Notes (Signed)
Heart Failure Stewardship Pharmacist Progress Note   PCP: [facility] PCP-Cardiologist: Jenkins Rouge, MD    HPI:  71 yo M with PMH of CHF, NSTEMI, HTN, DVT, seizures, T2DM, OSA, wheelchair bound due to severe spinal stenosis, and prostate cancer.    He was initially diagnosed with HF in 03/2020 when he presented to the hospital complaining of leg edema and weakness. ECHO at that time with EF <20% and RV mildly reduced. He was diuresed and left AMA on 04/13/20. He was discharged with lasix, carvedilol, and BiDil in the setting of AKI.    He presented back to the hospital on 04/21/20 with acute decompensated HF. He stated at this encounter that he had not picked up the medications due to financial reasons. He was diuresed and discharged on the same medications.    He was back at the hospital in 04/2020 with acute CHF and suspected seizure. He underwent stress test at that time. Significant perfusion defect of the entire anterior and inferior wall that is slightly worse at stress. However, does not appear to be severely abnormal, and distribution does not explain global hypokinesis. Likely infarct with small amount of worsening ischemia with stress, but cardiomyopathy out of proportion to ischemia/infarction. Diuresed and discharged with carvedilol, BiDil, and lasix PRN.   Hospitalized in 11/2021 with acute CHF. He ran out of home diuretics. Diuresed and discharged with torsemide, carvedilol, and restarted BiDil. ECHO from OSH in Feb 2023 with EF 40-45%.   Hospitalized from 7/27-8/3 with acute CHF. Diuresed and discharged to SNF with lasix 60 mg daily, carvedilol, and BiDil.  Hospitalized from 8/13-8/17 with acute CHF. Reported that urine output was decreasing and O2 requirements increasing at SNF. Question if he was getting lasix PTA. Diuresed and discharged back to SNF.   Presented to the ED on 8/19 with shortness of breath and edema. CXR with cardiomegaly and vascular congestion. Renal  consulted.  Current HF Medications: Diuretic: furosemide 80 mg IV BID Beta Blocker: carvedilol 12.5 mg BID Other: BiDil 1 tab TID  Prior to admission HF Medications (per SNF): Diuretic: furosemide 40 mg daily Beta blocker: carvedilol 12.5 mg BID ** not taking BiDil  Pertinent Lab Values: Serum creatinine 3.60, BUN 80, Potassium 5.0, Sodium 136, BNP 132.8, Magnesium 2.1, A1c 6.9   Vital Signs: Weight: 280 lbs (admission weight: 279 lbs) Blood pressure: 120/80s Heart rate: 60-70s I/O: -2.8L yesterday; net -3L  Medication Assistance / Insurance Benefits Check: Does the patient have prescription insurance?  Yes Type of insurance plan: Medicare + [facility]  Outpatient Pharmacy:  Prior to admission outpatient pharmacy: SNF  Assessment: 1. Acute on chronic systolic CHF (LVEF 79-39%), due to NICM. NYHA class III symptoms. - Continue furosemide 80 mg IV BID - Continue carvedilol 12.5 mg BID - No ACE/ARB/ARNI, MRA, or SGLT2i with AKI on advanced CKD (eGFR <20) - Continue BiDil 20/37.5 mg 1 tab TID   Plan: 1) Medication changes recommended at this time: - Continue IV diuresis  2) Patient assistance: - None needed - SNF at discharge  Kerby Nora, PharmD, BCPS Heart Failure Stewardship Pharmacist Phone [phone]

## 2022-05-20 ENCOUNTER — Encounter (HOSPITAL_COMMUNITY): Payer: No Typology Code available for payment source

## 2022-05-20 DIAGNOSIS — I5043 Acute on chronic combined systolic (congestive) and diastolic (congestive) heart failure: Secondary | ICD-10-CM | POA: Diagnosis not present

## 2022-05-20 LAB — GLUCOSE, CAPILLARY
Glucose-Capillary: 122 mg/dL — ABNORMAL HIGH (ref 70–99)
Glucose-Capillary: 130 mg/dL — ABNORMAL HIGH (ref 70–99)
Glucose-Capillary: 143 mg/dL — ABNORMAL HIGH (ref 70–99)
Glucose-Capillary: 172 mg/dL — ABNORMAL HIGH (ref 70–99)
Glucose-Capillary: 180 mg/dL — ABNORMAL HIGH (ref 70–99)
Glucose-Capillary: 218 mg/dL — ABNORMAL HIGH (ref 70–99)

## 2022-05-20 LAB — BASIC METABOLIC PANEL
Anion gap: 12 (ref 5–15)
BUN: 83 mg/dL — ABNORMAL HIGH (ref 8–23)
CO2: 20 mmol/L — ABNORMAL LOW (ref 22–32)
Calcium: 8.8 mg/dL — ABNORMAL LOW (ref 8.9–10.3)
Chloride: 105 mmol/L (ref 98–111)
Creatinine, Ser: 3.52 mg/dL — ABNORMAL HIGH (ref 0.61–1.24)
GFR, Estimated: 18 mL/min — ABNORMAL LOW (ref >60)
Glucose, Bld: 210 mg/dL — ABNORMAL HIGH (ref 70–99)
Potassium: 5.1 mmol/L (ref 3.5–5.1)
Sodium: 137 mmol/L (ref 135–145)

## 2022-05-20 LAB — URINE CULTURE: Culture: >=100000 — AB

## 2022-05-20 LAB — CBC
HCT: 28.2 % — ABNORMAL LOW (ref 39.0–52.0)
Hemoglobin: 9.3 g/dL — ABNORMAL LOW (ref 13.0–17.0)
MCH: 28.7 pg (ref 26.0–34.0)
MCHC: 33 g/dL (ref 30.0–36.0)
MCV: 87 fL (ref 80.0–100.0)
Platelets: 257 10*3/uL (ref 150–400)
RBC: 3.24 MIL/uL — ABNORMAL LOW (ref 4.22–5.81)
RDW: 14.5 % (ref 11.5–15.5)
WBC: 4.8 10*3/uL (ref 4.0–10.5)
nRBC: 0 % (ref 0.0–0.2)

## 2022-05-20 MED ORDER — FUROSEMIDE 10 MG/ML IJ SOLN
120.0000 mg | Freq: Three times a day (TID) | INTRAVENOUS | Status: DC
  Administered 2022-05-20 – 2022-05-25 (×15): 120 mg via INTRAVENOUS
  Filled 2022-05-20: qty 2
  Filled 2022-05-20 (×3): qty 10
  Filled 2022-05-20: qty 12
  Filled 2022-05-20 (×5): qty 10
  Filled 2022-05-20: qty 2
  Filled 2022-05-20: qty 12
  Filled 2022-05-20 (×5): qty 10

## 2022-05-20 MED ORDER — HYDROCODONE-ACETAMINOPHEN 5-325 MG PO TABS
1.0000 | ORAL_TABLET | Freq: Two times a day (BID) | ORAL | Status: DC | PRN
  Administered 2022-05-20 – 2022-05-22 (×6): 1 via ORAL
  Filled 2022-05-20 (×6): qty 1

## 2022-05-20 NOTE — Progress Notes (Signed)
Pt refused bipap overnight, educated on purpose but pt still refused. Sats 97% 3Lnc, weaned to 2L overnight. Denied shortness of breath all night till around 0600; woke up suddenly feeling short of breath; no drop in sats. Elevated head of bed and sob resolved.

## 2022-05-20 NOTE — Progress Notes (Signed)
   05/20/22 1056  Mobility  Activity Ambulated with assistance in room  Level of Assistance Moderate assist, patient does 50-74%  Assistive Device Stedy  Activity Response Tolerated well  $Mobility charge 1 Mobility   Mobility Specialist Progress Note  Pt was in bed and agreeable. Mod A to stand in steady. No c/o pain and left in chair w/ call bell in reach.   Lucious Groves Mobility Specialist

## 2022-05-20 NOTE — Plan of Care (Signed)
Problem: Education: Goal: Ability to demonstrate management of disease process will improve 05/20/2022 0806 by Serafina Mitchell, RN Outcome: Progressing 05/19/2022 1835 by Serafina Mitchell, RN Outcome: Progressing Goal: Ability to verbalize understanding of medication therapies will improve 05/20/2022 0806 by Serafina Mitchell, RN Outcome: Progressing 05/19/2022 1835 by Serafina Mitchell, RN Outcome: Progressing Goal: Individualized Educational Video(s) 05/20/2022 0806 by Serafina Mitchell, RN Outcome: Progressing 05/19/2022 1835 by Serafina Mitchell, RN Outcome: Progressing   Problem: Activity: Goal: Capacity to carry out activities will improve 05/20/2022 0806 by Serafina Mitchell, RN Outcome: Progressing 05/19/2022 1835 by Lora Paula D, RN Outcome: Progressing   Problem: Cardiac: Goal: Ability to achieve and maintain adequate cardiopulmonary perfusion will improve 05/20/2022 0806 by Serafina Mitchell, RN Outcome: Progressing 05/19/2022 1835 by Lora Paula D, RN Outcome: Progressing   Problem: Education: Goal: Ability to describe self-care measures that may prevent or decrease complications (Diabetes Survival Skills Education) will improve 05/20/2022 0806 by Serafina Mitchell, RN Outcome: Progressing 05/19/2022 1835 by Serafina Mitchell, RN Outcome: Progressing Goal: Individualized Educational Video(s) 05/20/2022 0806 by Serafina Mitchell, RN Outcome: Progressing 05/19/2022 1835 by Lora Paula D, RN Outcome: Progressing   Problem: Coping: Goal: Ability to adjust to condition or change in health will improve 05/20/2022 0806 by Serafina Mitchell, RN Outcome: Progressing 05/19/2022 1835 by Lora Paula D, RN Outcome: Progressing   Problem: Fluid Volume: Goal: Ability to maintain a balanced intake and output will improve 05/20/2022 0806 by Serafina Mitchell, RN Outcome: Progressing 05/19/2022 1835 by Serafina Mitchell,  RN Outcome: Progressing   Problem: Health Behavior/Discharge Planning: Goal: Ability to identify and utilize available resources and services will improve 05/20/2022 0806 by Serafina Mitchell, RN Outcome: Progressing 05/19/2022 1835 by Lora Paula D, RN Outcome: Progressing Goal: Ability to manage health-related needs will improve 05/20/2022 0806 by Serafina Mitchell, RN Outcome: Progressing 05/19/2022 1835 by Lora Paula D, RN Outcome: Progressing   Problem: Metabolic: Goal: Ability to maintain appropriate glucose levels will improve 05/20/2022 0806 by Serafina Mitchell, RN Outcome: Progressing 05/19/2022 1835 by Lora Paula D, RN Outcome: Progressing   Problem: Nutritional: Goal: Maintenance of adequate nutrition will improve 05/20/2022 0806 by Serafina Mitchell, RN Outcome: Progressing 05/19/2022 1835 by Lora Paula D, RN Outcome: Progressing Goal: Progress toward achieving an optimal weight will improve 05/20/2022 0806 by Serafina Mitchell, RN Outcome: Progressing 05/19/2022 1835 by Lora Paula D, RN Outcome: Progressing   Problem: Skin Integrity: Goal: Risk for impaired skin integrity will decrease 05/20/2022 0806 by Serafina Mitchell, RN Outcome: Progressing 05/19/2022 1835 by Lora Paula D, RN Outcome: Progressing   Problem: Tissue Perfusion: Goal: Adequacy of tissue perfusion will improve 05/20/2022 0806 by Serafina Mitchell, RN Outcome: Progressing 05/19/2022 1835 by Serafina Mitchell, RN Outcome: Progressing   Problem: Education: Goal: Knowledge of General Education information will improve Description: Including pain rating scale, medication(s)/side effects and non-pharmacologic comfort measures 05/20/2022 0806 by Serafina Mitchell, RN Outcome: Progressing 05/19/2022 1835 by Serafina Mitchell, RN Outcome: Progressing   Problem: Health Behavior/Discharge Planning: Goal: Ability to identify and utilize available  resources and services will improve 05/20/2022 0806 by Serafina Mitchell, RN Outcome: Progressing 05/19/2022 1835 by Lora Paula D, RN Outcome: Progressing Goal: Ability to manage health-related needs will improve 05/20/2022 0806 by Serafina Mitchell, RN Outcome: Progressing 05/19/2022 1835 by Lora Paula D, RN Outcome: Progressing   Problem: Metabolic: Goal: Ability to maintain appropriate glucose levels will  improve 05/20/2022 0806 by Lora Paula D, RN Outcome: Progressing 05/19/2022 1835 by Lora Paula D, RN Outcome: Progressing   Problem: Nutritional: Goal: Maintenance of adequate nutrition will improve 05/20/2022 0806 by Serafina Mitchell, RN Outcome: Progressing 05/19/2022 1835 by Lora Paula D, RN Outcome: Progressing Goal: Progress toward achieving an optimal weight will improve 05/20/2022 0806 by Serafina Mitchell, RN Outcome: Progressing 05/19/2022 1835 by Lora Paula D, RN Outcome: Progressing   Problem: Skin Integrity: Goal: Risk for impaired skin integrity will decrease 05/20/2022 0806 by Serafina Mitchell, RN Outcome: Progressing 05/19/2022 1835 by Lora Paula D, RN Outcome: Progressing   Problem: Tissue Perfusion: Goal: Adequacy of tissue perfusion will improve 05/20/2022 0806 by Serafina Mitchell, RN Outcome: Progressing 05/19/2022 1835 by Serafina Mitchell, RN Outcome: Progressing   Problem: Education: Goal: Knowledge of General Education information will improve Description: Including pain rating scale, medication(s)/side effects and non-pharmacologic comfort measures 05/20/2022 0806 by Serafina Mitchell, RN Outcome: Progressing 05/19/2022 1835 by Serafina Mitchell, RN Outcome: Progressing   Problem: Health Behavior/Discharge Planning: Goal: Ability to manage health-related needs will improve 05/20/2022 0806 by Serafina Mitchell, RN Outcome: Progressing 05/19/2022 1835 by Lora Paula D,  RN Outcome: Progressing   Problem: Clinical Measurements: Goal: Ability to maintain clinical measurements within normal limits will improve 05/20/2022 0806 by Serafina Mitchell, RN Outcome: Progressing 05/19/2022 1835 by Lora Paula D, RN Outcome: Progressing Goal: Will remain free from infection 05/20/2022 0806 by Serafina Mitchell, RN Outcome: Progressing 05/19/2022 1835 by Lora Paula D, RN Outcome: Progressing Goal: Diagnostic test results will improve 05/20/2022 0806 by Serafina Mitchell, RN Outcome: Progressing 05/19/2022 1835 by Lora Paula D, RN Outcome: Progressing Goal: Respiratory complications will improve 05/20/2022 0806 by Serafina Mitchell, RN Outcome: Progressing 05/19/2022 1835 by Lora Paula D, RN Outcome: Progressing Goal: Cardiovascular complication will be avoided 05/20/2022 0806 by Serafina Mitchell, RN Outcome: Progressing 05/19/2022 1835 by Serafina Mitchell, RN Outcome: Progressing   Problem: Activity: Goal: Risk for activity intolerance will decrease 05/20/2022 0806 by Serafina Mitchell, RN Outcome: Progressing 05/19/2022 1835 by Lora Paula D, RN Outcome: Progressing   Problem: Nutrition: Goal: Adequate nutrition will be maintained 05/20/2022 0806 by Serafina Mitchell, RN Outcome: Progressing 05/19/2022 1835 by Lora Paula D, RN Outcome: Progressing   Problem: Coping: Goal: Level of anxiety will decrease 05/20/2022 0806 by Serafina Mitchell, RN Outcome: Progressing 05/19/2022 1835 by Lora Paula D, RN Outcome: Progressing   Problem: Elimination: Goal: Will not experience complications related to bowel motility 05/20/2022 0806 by Serafina Mitchell, RN Outcome: Progressing 05/19/2022 1835 by Lora Paula D, RN Outcome: Progressing Goal: Will not experience complications related to urinary retention 05/20/2022 0806 by Serafina Mitchell, RN Outcome: Progressing 05/19/2022 1835 by  Serafina Mitchell, RN Outcome: Progressing   Problem: Pain Managment: Goal: General experience of comfort will improve 05/20/2022 0806 by Serafina Mitchell, RN Outcome: Progressing 05/19/2022 1835 by Lora Paula D, RN Outcome: Progressing   Problem: Safety: Goal: Ability to remain free from injury will improve 05/20/2022 0806 by Serafina Mitchell, RN Outcome: Progressing 05/19/2022 1835 by Lora Paula D, RN Outcome: Progressing   Problem: Skin Integrity: Goal: Risk for impaired skin integrity will decrease 05/20/2022 0806 by Serafina Mitchell, RN Outcome: Progressing 05/19/2022 1835 by Serafina Mitchell, RN Outcome: Progressing

## 2022-05-20 NOTE — Progress Notes (Signed)
SLP Cancellation Note  Patient Details Name: Tim Bradley MRN: [id number] DOB: 1951/01/24   Cancelled treatment:       Reason Eval/Treat Not Completed: Patient at procedure or test/unavailable (Pt working with mobility specialists and then discussing POC wityh MD. SLP will follow up later as schedule allows.)  [name] I. Hardin Negus, [place], [place] Office number [phone]  Horton Marshall 05/20/2022, 10:55 AM

## 2022-05-20 NOTE — Progress Notes (Signed)
PROGRESS NOTE    OTTIS VACHA  ZYS:[id number] DOB: 09/18/1951 DOA: 05/17/2022 PCP: [facility]  Tim Bradley is a 71 y.o. resident of SNF with chronic systolic CHF, EF 93%, CKD stage IV, DVT, hypertension, T2DM, bed and wheelchair bound, and COPD, Malignant neoplasm prostate just discharged from [facility] on 8/17 after treatment for CHF, he was 6.5 L negative and discharged on furosemide 80 Mg daily  -Back from [facility] with increased dyspnea, swelling and abdominal tightness -Chest x-ray with cardiomegaly,?  Early edema, blood pressure in the 200s initially, improved after nitro and Lasix  Subjective: -Short of breath last night, required BiPAP, now improving  Assessment and Plan:  Acute on chronic systolic CHF (congestive heart failure) (HCC) -Known cardiomyopathy, EF 35-40% -Recurrent admissions with CHF, further complicated by CKD 4, followed at the [facility] -Reports compliance with medications, he was 6.5 L negative at the time of recent discharge,  -Continue IV Lasix 80 Mg twice daily, carvedilol and BiDil, he is 3.3 L negative -Appreciate nephrology input, he is a marginal dialysis candidate and does not seem to want hemodialysis either atleast for now, I'm concerned about the frequency of his re-admissions, fortunately doesn't need HD yet -Will attempt to arrange trilogy, BiPAP at SNF -GDMT limited by CKD 4 -Importance of compliance with diet again emphasized -BMP in a.m.   CKD (chronic kidney disease) stage 4, GFR 15-29 ml/min (HCC) AKI on chronic kidney disease stage IV.  -Baseline creatinine around 3, 3.4 on admission, await AM labs -Diuretics as noted above  ?  UTI -Abnormal UA, marginal symptoms, continue ceftriaxone, follow-up urine culture  Hypertensive urgency -Improving, continue Coreg and BiDil   Diabetes mellitus without complication (HCC) -CBGs are stable, continue sliding scale   Chronic deep vein thrombosis (DVT) (HCC) Right lower extremity DVT -Continue  apixaban   COPD (chronic obstructive pulmonary disease) (HCC) -Continue bronchodilator therapy, no wheezing   Debility, bed and wheelchair bound -Poor prognosis -Long-term SNF resident   Seizures ([place]) -Stable, continue with Vimpat and keppra.    Class 2 obesity Calculated BMI is 35,3    DVT prophylaxis: Apixaban Code Status: DNR Family Communication: no family at bedside, Discussed with sister via telephone Dispo: Back to SNF when stable  Consultants:  Nephrology  Procedures:   Antimicrobials:    Objective: Vitals:   05/20/22 0020 05/20/22 0414 05/20/22 0515 05/20/22 0804  BP: 105/62 (!) 155/60  (!) 124/50  Pulse: 71 73  72  Resp: 18 17  18   Temp:  98.5 F (36.9 C)  98.1 F (36.7 C)  TempSrc:  Oral  Oral  SpO2: 100% 97%  97%  Weight:   128.7 kg   Height:        Intake/Output Summary (Last 24 hours) at 05/20/2022 1212 Last data filed at 05/20/2022 0800 Gross per 24 hour  Intake 457.66 ml  Output 2700 ml  Net -2242.34 ml   Filed Weights   05/19/22 0328 05/20/22 0008 05/20/22 0515  Weight: 127.4 kg 128 kg 128.7 kg    Examination:  General exam: Elderly male laying in bed, AAO x2, no distress HEENT: Positive JVD CVS: S1-S2, regular rhythm Lungs: Decreased breath sounds the bases Abdomen: Soft, obese, nontender, bowel sounds present Extremities: 1-2+ edema, up to upper thighs Skin: No rashes on exposed skin Psychiatry:  Mood & affect appropriate.     Data Reviewed:   CBC: Recent Labs  Lab 05/17/22 2050 05/17/22 2251 05/18/22 0554 05/19/22 0735 05/20/22 0412  WBC 5.0  --  4.9 4.4 4.8  NEUTROABS 2.7  --   --   --   --   HGB 10.5* 11.2* 10.1* 9.6* 9.3*  HCT 34.2* 33.0* 32.3* 30.4* 28.2*  MCV 91.2  --  89.7 88.1 87.0  PLT 289  --  305 245 517   Basic Metabolic Panel: Recent Labs  Lab 05/15/22 0413 05/17/22 2050 05/17/22 2251 05/18/22 1125 05/19/22 0735 05/20/22 0412  NA 140 136 136 136 136 137  K 4.7 5.3* 6.2* 4.8 5.0 5.1  CL 107  104  --  104 105 105  CO2 24 23  --  23 23 20*  GLUCOSE 142* 116*  --  184* 117* 210*  BUN 63* 68*  --  71* 80* 83*  CREATININE 3.39* 3.46*  --  3.33* 3.60* 3.52*  CALCIUM 9.1 9.1  --  9.2 8.9 8.8*  MG  --   --   --  2.1  --   --   PHOS  --   --   --  5.1*  --   --    GFR: Estimated Creatinine Clearance: 26.6 mL/min (A) (by C-G formula based on SCr of 3.52 mg/dL (H)). Liver Function Tests: No results for input(s): "AST", "ALT", "ALKPHOS", "BILITOT", "PROT", "ALBUMIN" in the last 168 hours.  No results for input(s): "LIPASE", "AMYLASE" in the last 168 hours. No results for input(s): "AMMONIA" in the last 168 hours. Coagulation Profile: No results for input(s): "INR", "PROTIME" in the last 168 hours. Cardiac Enzymes: No results for input(s): "CKTOTAL", "CKMB", "CKMBINDEX", "TROPONINI" in the last 168 hours. BNP (last 3 results) No results for input(s): "PROBNP" in the last 8760 hours. HbA1C: No results for input(s): "HGBA1C" in the last 72 hours. CBG: Recent Labs  Lab 05/19/22 2055 05/20/22 0005 05/20/22 0411 05/20/22 0738 05/20/22 1154  GLUCAP 145* 122* 218* 180* 172*   Lipid Profile: No results for input(s): "CHOL", "HDL", "LDLCALC", "TRIG", "CHOLHDL", "LDLDIRECT" in the last 72 hours. Thyroid Function Tests: Recent Labs    05/17/22 2240  TSH 0.761   Anemia Panel: Recent Labs    05/17/22 2240 05/18/22 1125  VITAMINB12  --  229  FOLATE  --  9.4  TIBC  --  344  IRON  --  85  RETICCTPCT 1.8  --    Urine analysis:    Component Value Date/Time   COLORURINE YELLOW 05/17/2022 2240   APPEARANCEUR CLOUDY (A) 05/17/2022 2240   LABSPEC 1.015 05/17/2022 2240   PHURINE 6.0 05/17/2022 2240   GLUCOSEU NEGATIVE 05/17/2022 2240   HGBUR TRACE (A) 05/17/2022 2240   BILIRUBINUR NEGATIVE 05/17/2022 2240   KETONESUR NEGATIVE 05/17/2022 2240   PROTEINUR 100 (A) 05/17/2022 2240   UROBILINOGEN 0.2 03/11/2012 2012   NITRITE NEGATIVE 05/17/2022 2240   LEUKOCYTESUR LARGE (A)  05/17/2022 2240   Sepsis Labs: @LABRCNTIP (procalcitonin:4,lacticidven:4)  ) Recent Results (from the past 240 hour(s))  Resp Panel by RT-PCR (Flu A&B, Covid) Anterior Nasal Swab     Status: None   Collection Time: 05/11/22 12:39 PM   Specimen: Anterior Nasal Swab  Result Value Ref Range Status   SARS Coronavirus 2 by RT PCR NEGATIVE NEGATIVE Final    Comment: (NOTE) SARS-CoV-2 target nucleic acids are NOT DETECTED.  The SARS-CoV-2 RNA is generally detectable in upper respiratory specimens during the acute phase of infection. The lowest concentration of SARS-CoV-2 viral copies this assay can detect is 138 copies/mL. A negative result does not preclude SARS-Cov-2 infection and should not be used as the sole  basis for treatment or other patient management decisions. A negative result may occur with  improper specimen collection/handling, submission of specimen other than nasopharyngeal swab, presence of viral mutation(s) within the areas targeted by this assay, and inadequate number of viral copies(<138 copies/mL). A negative result must be combined with clinical observations, patient history, and epidemiological information. The expected result is Negative.  Fact Sheet for Patients:  EntrepreneurPulse.com.au  Fact Sheet for Healthcare Providers:  IncredibleEmployment.be  This test is no t yet approved or cleared by the [place] FDA and  has been authorized for detection and/or diagnosis of SARS-CoV-2 by FDA under an Emergency Use Authorization (EUA). This EUA will remain  in effect (meaning this test can be used) for the duration of the COVID-19 declaration under Section 564(b)(1) of the Act, 21 U.S.C.section 360bbb-3(b)(1), unless the authorization is terminated  or revoked sooner.       Influenza A by PCR NEGATIVE NEGATIVE Final   Influenza B by PCR NEGATIVE NEGATIVE Final    Comment: (NOTE) The Xpert Xpress SARS-CoV-2/FLU/RSV  plus assay is intended as an aid in the diagnosis of influenza from Nasopharyngeal swab specimens and should not be used as a sole basis for treatment. Nasal washings and aspirates are unacceptable for Xpert Xpress SARS-CoV-2/FLU/RSV testing.  Fact Sheet for Patients: EntrepreneurPulse.com.au  Fact Sheet for Healthcare Providers: IncredibleEmployment.be  This test is not yet approved or cleared by the [place] FDA and has been authorized for detection and/or diagnosis of SARS-CoV-2 by FDA under an Emergency Use Authorization (EUA). This EUA will remain in effect (meaning this test can be used) for the duration of the COVID-19 declaration under Section 564(b)(1) of the Act, 21 U.S.C. section 360bbb-3(b)(1), unless the authorization is terminated or revoked.  Performed at [facility], [place] [address] [place], [place] [address]   Urine Culture     Status: Abnormal   Collection Time: 05/17/22 10:19 PM   Specimen: Urine, Catheterized  Result Value Ref Range Status   Specimen Description URINE, CATHETERIZED  Final   Special Requests   Final    NONE Performed at [facility], [place] [address], [place], [place] [address]    Culture >=100,000 COLONIES/mL CITROBACTER KOSERI (A)  Final   Report Status 05/20/2022 FINAL  Final   Organism ID, Bacteria CITROBACTER KOSERI (A)  Final      Susceptibility   Citrobacter koseri - MIC*    CEFAZOLIN <=4 SENSITIVE Sensitive     CEFEPIME <=0.12 SENSITIVE Sensitive     CEFTRIAXONE <=0.25 SENSITIVE Sensitive     CIPROFLOXACIN <=0.25 SENSITIVE Sensitive     GENTAMICIN <=1 SENSITIVE Sensitive     IMIPENEM <=0.25 SENSITIVE Sensitive     NITROFURANTOIN 32 SENSITIVE Sensitive     TRIMETH/SULFA <=20 SENSITIVE Sensitive     PIP/TAZO <=4 SENSITIVE Sensitive     * >=100,000 COLONIES/mL CITROBACTER KOSERI     Radiology Studies: No results found.   Scheduled Meds:  apixaban  5 mg Oral BID    carvedilol  12.5 mg Oral BID   finasteride  5 mg Oral Daily   insulin aspart  0-9 Units Subcutaneous Q4H   isosorbide-hydrALAZINE  1 tablet Oral TID   lacosamide  100 mg Oral BID   levETIRAcetam  750 mg Oral BID   mometasone-formoterol  2 puff Inhalation BID   senna-docusate  1 tablet Oral BID   sodium chloride flush  3 mL Intravenous Q12H   Continuous Infusions:  sodium chloride  cefTRIAXone (ROCEPHIN)  IV Stopped (05/20/22 0029)   furosemide       LOS: 3 days    Time spent: 68min  Domenic Polite, MD [facility]   05/20/2022, 12:12 PM

## 2022-05-20 NOTE — Progress Notes (Signed)
Heart Failure Stewardship Pharmacist Progress Note   PCP: [facility] PCP-Cardiologist: Jenkins Rouge, MD    HPI:  71 yo M with PMH of CHF, NSTEMI, HTN, DVT, seizures, T2DM, OSA, wheelchair bound due to severe spinal stenosis, and prostate cancer.    He was initially diagnosed with HF in 03/2020 when he presented to the hospital complaining of leg edema and weakness. ECHO at that time with EF <20% and RV mildly reduced. He was diuresed and left AMA on 04/13/20. He was discharged with lasix, carvedilol, and BiDil in the setting of AKI.    He presented back to the hospital on 04/21/20 with acute decompensated HF. He stated at this encounter that he had not picked up the medications due to financial reasons. He was diuresed and discharged on the same medications.    He was back at the hospital in 04/2020 with acute CHF and suspected seizure. He underwent stress test at that time. Significant perfusion defect of the entire anterior and inferior wall that is slightly worse at stress. However, does not appear to be severely abnormal, and distribution does not explain global hypokinesis. Likely infarct with small amount of worsening ischemia with stress, but cardiomyopathy out of proportion to ischemia/infarction. Diuresed and discharged with carvedilol, BiDil, and lasix PRN.   Hospitalized in 11/2021 with acute CHF. He ran out of home diuretics. Diuresed and discharged with torsemide, carvedilol, and restarted BiDil. ECHO from OSH in Feb 2023 with EF 40-45%.   Hospitalized from 7/27-8/3 with acute CHF. Diuresed and discharged to SNF with lasix 60 mg daily, carvedilol, and BiDil.  Hospitalized from 8/13-8/17 with acute CHF. Reported that urine output was decreasing and O2 requirements increasing at SNF. Question if he was getting lasix PTA. Diuresed and discharged back to SNF.   Presented to the ED on 8/19 with shortness of breath and edema. CXR with cardiomegaly and vascular congestion. Renal  consulted.  Current HF Medications: Diuretic: furosemide 80 mg IV BID Beta Blocker: carvedilol 12.5 mg BID Other: BiDil 1 tab TID  Prior to admission HF Medications (per SNF): Diuretic: furosemide 40 mg daily Beta blocker: carvedilol 12.5 mg BID ** not taking BiDil  Pertinent Lab Values: Serum creatinine 3.52, BUN 83, Potassium 5.1, Sodium 137, BNP 132.8, Magnesium 2.1, A1c 6.9   Vital Signs: Weight: 283 lbs (admission weight: 279 lbs) Blood pressure: 120-150/60s Heart rate: 60-70s I/O: -1.9L yesterday; net -4.7L  Medication Assistance / Insurance Benefits Check: Does the patient have prescription insurance?  Yes Type of insurance plan: Medicare + [facility]  Outpatient Pharmacy:  Prior to admission outpatient pharmacy: SNF  Assessment: 1. Acute on chronic systolic CHF (LVEF 33-82%), due to NICM. NYHA class III symptoms. - Continue furosemide 80 mg IV BID. Strict I/Os. Daily weights. Keep K>4 and Mag>2. - Continue carvedilol 12.5 mg BID - No ACE/ARB/ARNI, MRA, or SGLT2i with AKI on advanced CKD (eGFR <20) - Continue BiDil 20/37.5 mg 1 tab TID   Plan: 1) Medication changes recommended at this time: - Continue IV diuresis  2) Patient assistance: - None needed - SNF at discharge  Kerby Nora, PharmD, BCPS Heart Failure Stewardship Pharmacist Phone [phone]

## 2022-05-20 NOTE — Progress Notes (Signed)
Heart Failure Nurse Navigator Progress Note  PCP: [facility]-Cardiologist: [facility] Diagnosis: Acute on chronic congestive heart failure, Acute respiratory failure, Chronic kidney disease stage 4, GFR 15-29.  Admitted from: [facility] SNF via EMS  Presentation:   Tim Bradley presented with shortness of breath from SNF , recently discharge on 05/15/2022 for CHF exac., increased bilateral lower extremity edema. Wears 2 L oxygen at baseline, Resident at [facility] [place] , [place]. BP 210/120,, HR 72, elevated BMI, 3+ edema to lower extremities,  BNP 132.8, Creat 3.46 - 3.52, IV lasix, given, CXR showed pulmonary congestion reflective of volume overload.   Patient recently also hospitalized from 04/24/22 - 04/30/2022 for heart failure, Patient was educated on the sign and symptoms of heart failure, daily weights, ( states the SNF weighs him, but not every day) Diet/ fluid restrictions, ( states he doesn't eat salt, and he will only drink a soda every once in a while.) Taking all his medications as prescribed, ( patient states he doesn't know what medications he takes, but whatever they give him there he takes".) Compliance with attending all his medical appointments, states either the facility or his sister takes him.) A scheduled HF TOC appointment is 05/26/2022 @ 11 am. per his prior admission. Will change if still hospitalized.   ECHO/ LVEF: 35-40%  Clinical Course:  Past Medical History:  Diagnosis Date   Abscess 02/2017   LEFT GLUTEAL    Acid reflux    Alcohol abuse    Anemia    CHF (congestive heart failure) (HCC)    CKD (chronic kidney disease) stage 4, GFR 15-29 ml/min (HCC) 04/11/2020   Cocaine abuse (HCC)    DDD (degenerative disc disease), lumbar    Diabetes mellitus without complication (HCC)    Dyspnea    Enlarged prostate    Hypertension    Seizures ([place])    Spinal stenosis, lumbar      Social History   Socioeconomic History   Marital status: Widowed    Spouse  name: Not on file   Number of children: 0   Years of education: Not on file   Highest education level: High school graduate  Occupational History   Occupation: retired  Tobacco Use   Smoking status: Former    Packs/day: 0.25    Years: 40.00    Total pack years: 10.00    Types: Cigarettes    Quit date: 2018    Years since quitting: 5.6   Smokeless tobacco: Never  Vaping Use   Vaping Use: Never used  Substance and Sexual Activity   Alcohol use: Not Currently    Alcohol/week: 12.0 standard drinks of alcohol    Types: 12 Cans of beer per week    Comment: states he quit 3 years ago   Drug use: Not Currently    Types: Cocaine, Marijuana    Comment: 03/2020    Sexual activity: Not Currently  Other Topics Concern   Not on file  Social History Narrative   [place] veteran   Social Determinants of Health   Financial Resource Strain: Medium Risk (05/06/2018)   Overall Financial Resource Strain (CARDIA)    Difficulty of Paying Living Expenses: Somewhat hard  Food Insecurity: No Food Insecurity (05/12/2022)   Hunger Vital Sign    Worried About Running Out of Food in the Last Year: Never true    [place] in the Last Year: Never true  Transportation Needs: No Transportation Needs (05/12/2022)   [place] - Transportation  Lack of Transportation (Medical): No    Lack of Transportation (Non-Medical): No  Physical Activity: Inactive (05/06/2018)   Exercise Vital Sign    Days of Exercise per Week: 0 days    Minutes of Exercise per Session: 0 min  Stress: No Stress Concern Present (05/06/2018)   [place]    Feeling of Stress : Only a little  Social Connections: Moderately Isolated (05/06/2018)   Social Connection and Isolation Panel [NHANES]    Frequency of Communication with Friends and Family: More than three times a week    Frequency of Social Gatherings with Friends and Family: Once a week    Attends Religious  Services: Never    Marine scientist or Organizations: No    Attends Archivist Meetings: Never    Marital Status: Widowed    High Risk Criteria for Readmission and/or Poor Patient Outcomes: Heart failure hospital admissions (last 6 months): 3  No Show rate: 2 % Difficult social situation: No Demonstrates medication adherence: Yes, per patient Primary Language: English Literacy level: Reading, writing, and comprehension  Barriers of Care:   Diet/ fluid restrictions Daily weights Medication compliance?    Considerations/Referrals:   Referral made to Heart Failure Pharmacist Stewardship: yes Referral made to Heart Failure CSW/NCM TOC: No Referral made to Heart & Vascular TOC clinic: Yes, still established appointment made from recent discharge 05/15/22, HF TOC appointment on 05/26/2022. (Will change if still hospitalized)  Items for Follow-up on DC/TOC: Diet/ fluid restrictions Daily weights Medication compliance?   [name], BSN, Clinical cytogeneticist Only

## 2022-05-20 NOTE — Progress Notes (Signed)
Admit: 05/17/2022 LOS: 3  67M AoCKD4, AoC HFrEF, chronic illness requiring bed/wheelchair  Subjective:  2.7L UOP, 1.9L net negative and 4.9L neg through admission  Weights not coming down yet 2L [place] Stable SCr 3.52, K 5.1 Sister [name] at bedside, discussed RRT, palliative care with her and Pt PT hopes to avoid HD, might refuse if indicated, will d/w sister  08/21 0701 - 08/22 0700 In: 800 [P.O.:800] Out: 2700 [Urine:2700]  Filed Weights   05/19/22 0328 05/20/22 0008 05/20/22 0515  Weight: 127.4 kg 128 kg 128.7 kg    Scheduled Meds:  apixaban  5 mg Oral BID   carvedilol  12.5 mg Oral BID   finasteride  5 mg Oral Daily   furosemide  80 mg Intravenous Q12H   insulin aspart  0-9 Units Subcutaneous Q4H   isosorbide-hydrALAZINE  1 tablet Oral TID   lacosamide  100 mg Oral BID   levETIRAcetam  750 mg Oral BID   mometasone-formoterol  2 puff Inhalation BID   senna-docusate  1 tablet Oral BID   sodium chloride flush  3 mL Intravenous Q12H   Continuous Infusions:  sodium chloride     cefTRIAXone (ROCEPHIN)  IV Stopped (05/20/22 0029)   PRN Meds:.sodium chloride, acetaminophen **OR** acetaminophen, bisacodyl, HYDROcodone-acetaminophen, ipratropium-albuterol, melatonin, polyethylene glycol, sodium chloride flush  Current Labs: reviewed    Physical Exam:  Blood pressure (!) 124/50, pulse 72, temperature 98.1 F (36.7 C), temperature source Oral, resp. rate 18, height 6\' 2"  (1.88 m), weight 128.7 kg, SpO2 97 %. GEN: Chronically ill-appearing, lying in bed, resting comfortably ENT: NCAT, poor dentition EYES: EOMI CV: Regular, normal S1 and S2 PULM: Diminished in the bases ABD: Distended, nontender SKIN: No rashes or lesions EXT: 2-3+ edema bilaterally in the legs and thighs including sacral region/hips  A AoCKD4: Likely due to diuretics and decompensated heart failure, need to achieve euvolemia.  At least today he states he would not receive dialysis even if needed, seems to be  a marginal candidate for dialysis, at best.  [facility]rology. AoC HFrEF, hypervolemic and congested.  Recurrent.  Continue Lasix as below.  Continue sodium restriction. Hypertension, blood pressure stable DM 2 COPD Anemia, mild Hx/o DVT on DOAC  P Cont Diuretics, inc dose today; has been responsive Pt considering likey need for HD sooner/later If desired, offered palliative consult Daily weights, Daily Renal Panel, Strict I/Os, Avoid nephrotoxins (NSAIDs, judicious IV Contrast)  Medication Issues; Preferred narcotic agents for pain control are hydromorphone, fentanyl, and methadone. Morphine should not be used.  Baclofen should be avoided Avoid oral sodium phosphate and magnesium citrate based laxatives / bowel preps    Pearson Grippe MD 05/20/2022, 10:31 AM  Recent Labs  Lab 05/18/22 1125 05/19/22 0735 05/20/22 0412  NA 136 136 137  K 4.8 5.0 5.1  CL 104 105 105  CO2 23 23 20*  GLUCOSE 184* 117* 210*  BUN 71* 80* 83*  CREATININE 3.33* 3.60* 3.52*  CALCIUM 9.2 8.9 8.8*  PHOS 5.1*  --   --    Recent Labs  Lab 05/17/22 2050 05/17/22 2251 05/18/22 0554 05/19/22 0735 05/20/22 0412  WBC 5.0  --  4.9 4.4 4.8  NEUTROABS 2.7  --   --   --   --   HGB 10.5*   < > 10.1* 9.6* 9.3*  HCT 34.2*   < > 32.3* 30.4* 28.2*  MCV 91.2  --  89.7 88.1 87.0  PLT 289  --  305 245 257   < > =  values in this interval not displayed.

## 2022-05-20 NOTE — Progress Notes (Signed)
Orthopedic Tech Progress Note Patient Details:  Tim Bradley 07-03-1951 [id number]  Ortho Devices Type of Ortho Device: Louretta Parma boot Ortho Device/Splint Location: BLE Ortho Device/Splint Interventions: Ordered, Application   Post Interventions Patient Tolerated: Well  [name] 05/20/2022, 8:19 PM

## 2022-05-20 NOTE — Progress Notes (Signed)
Physical Therapy Treatment Patient Details Name: Tim Bradley MRN: [id number] DOB: 23-Jul-1951 Today's Date: 05/20/2022   History of Present Illness 71 y.o. male presents to [facility] hospital from SNF on 05/17/2022 with SOB. PMH:  chronic systolic heart failure, CKD4, NSTEMI, hypertension, DVT , DM2, OSA, prostate ca, WC bound due to severe stenosis    PT Comments    Pt making slow progress with mobility. Expect rehab will be extended and recommend return to SNF at DC.    Recommendations for follow up therapy are one component of a multi-disciplinary discharge planning process, led by the attending physician.  Recommendations may be updated based on patient status, additional functional criteria and insurance authorization.  Follow Up Recommendations  Skilled nursing-short term rehab (<3 hours/day) Can patient physically be transported by private vehicle: No   Assistance Recommended at Discharge Frequent or constant Supervision/Assistance  Patient can return home with the following A lot of help with bathing/dressing/bathroom;Assistance with cooking/housework;Assist for transportation;Help with stairs or ramp for entrance;Two people to help with walking and/or transfers   Equipment Recommendations  None recommended by PT    Recommendations for Other Services       Precautions / Restrictions Precautions Precautions: Fall Precaution Comments: watch O2, on 2.5 L during eval Restrictions Weight Bearing Restrictions: No     Mobility  Bed Mobility Overal bed mobility: Needs Assistance Bed Mobility: Sit to Supine       Sit to supine: +2 for physical assistance, Mod assist   General bed mobility comments: Assist to guide trunk down and to bring legs back up into bed    Transfers Overall transfer level: Needs assistance Equipment used: Ambulation equipment used Transfers: Sit to/from Stand, Bed to chair/wheelchair/BSC Sit to Stand: +2 physical assistance, Mod assist            General transfer comment: Assist to bring hips up and for balance. Pt with flexed trunk in standing Transfer via Lift Equipment: Stedy  Ambulation/Gait             Pre-gait activities: Stood x 45 sec x 2 in [employer] Mobility    Modified Rankin (Stroke Patients Only)       Balance Overall balance assessment: Needs assistance Sitting-balance support: No upper extremity supported, Feet supported Sitting balance-Leahy Scale: Fair     Standing balance support: Bilateral upper extremity supported Standing balance-Leahy Scale: Poor Standing balance comment: Stedy and mod assist for static standing                            Cognition Arousal/Alertness: Awake/alert Behavior During Therapy: WFL for tasks assessed/performed Overall Cognitive Status: Within Functional Limits for tasks assessed                                          Exercises      General Comments General comments (skin integrity, edema, etc.): VSS on 3L O2      Pertinent Vitals/Pain Pain Assessment Pain Assessment: Faces Faces Pain Scale: No hurt    Home Living                          Prior Function  PT Goals (current goals can now be found in the care plan section) Acute Rehab PT Goals Patient Stated Goal: return to SNF Progress towards PT goals: Progressing toward goals    Frequency    Min 2X/week      PT Plan Current plan remains appropriate;Frequency needs to be updated    Co-evaluation              AM-PAC PT "6 Clicks" Mobility   Outcome Measure  Help needed turning from your back to your side while in a flat bed without using bedrails?: A Lot Help needed moving from lying on your back to sitting on the side of a flat bed without using bedrails?: A Lot Help needed moving to and from a bed to a chair (including a wheelchair)?: Total Help needed standing up from a chair using your  arms (e.g., wheelchair or bedside chair)?: Total Help needed to walk in hospital room?: Total Help needed climbing 3-5 steps with a railing? : Total 6 Click Score: 8    End of Session Equipment Utilized During Treatment: Oxygen Activity Tolerance: Patient limited by fatigue Patient left: with call bell/phone within reach;in bed;with bed alarm set   PT Visit Diagnosis: Unsteadiness on feet (R26.81);Muscle weakness (generalized) (M62.81);Difficulty in walking, not elsewhere classified (R26.2)     Time: 7622-6333 PT Time Calculation (min) (ACUTE ONLY): 17 min  Charges:  $Therapeutic Activity: 8-22 mins                     [place] [facility] [place] 05/20/2022, 3:22 PM

## 2022-05-21 DIAGNOSIS — N400 Enlarged prostate without lower urinary tract symptoms: Secondary | ICD-10-CM | POA: Diagnosis not present

## 2022-05-21 DIAGNOSIS — N184 Chronic kidney disease, stage 4 (severe): Secondary | ICD-10-CM | POA: Diagnosis not present

## 2022-05-21 DIAGNOSIS — I5043 Acute on chronic combined systolic (congestive) and diastolic (congestive) heart failure: Secondary | ICD-10-CM | POA: Diagnosis not present

## 2022-05-21 DIAGNOSIS — E1169 Type 2 diabetes mellitus with other specified complication: Secondary | ICD-10-CM | POA: Diagnosis not present

## 2022-05-21 LAB — GLUCOSE, CAPILLARY
Glucose-Capillary: 113 mg/dL — ABNORMAL HIGH (ref 70–99)
Glucose-Capillary: 128 mg/dL — ABNORMAL HIGH (ref 70–99)
Glucose-Capillary: 148 mg/dL — ABNORMAL HIGH (ref 70–99)
Glucose-Capillary: 168 mg/dL — ABNORMAL HIGH (ref 70–99)
Glucose-Capillary: 175 mg/dL — ABNORMAL HIGH (ref 70–99)
Glucose-Capillary: 207 mg/dL — ABNORMAL HIGH (ref 70–99)
Glucose-Capillary: 278 mg/dL — ABNORMAL HIGH (ref 70–99)

## 2022-05-21 LAB — BASIC METABOLIC PANEL
Anion gap: 10 (ref 5–15)
BUN: 85 mg/dL — ABNORMAL HIGH (ref 8–23)
CO2: 23 mmol/L (ref 22–32)
Calcium: 9.2 mg/dL (ref 8.9–10.3)
Chloride: 103 mmol/L (ref 98–111)
Creatinine, Ser: 3.5 mg/dL — ABNORMAL HIGH (ref 0.61–1.24)
GFR, Estimated: 18 mL/min — ABNORMAL LOW (ref >60)
Glucose, Bld: 127 mg/dL — ABNORMAL HIGH (ref 70–99)
Potassium: 4.7 mmol/L (ref 3.5–5.1)
Sodium: 136 mmol/L (ref 135–145)

## 2022-05-21 MED ORDER — ALPRAZOLAM 0.5 MG PO TABS
1.0000 mg | ORAL_TABLET | Freq: Three times a day (TID) | ORAL | Status: DC | PRN
  Administered 2022-05-21 – 2022-05-26 (×10): 1 mg via ORAL
  Filled 2022-05-21 (×10): qty 2

## 2022-05-21 MED ORDER — ORAL CARE MOUTH RINSE: 15.0000 mL | OROMUCOSAL | Status: DC | PRN

## 2022-05-21 NOTE — Progress Notes (Signed)
OT Cancellation Note  Patient Details Name: Tim Bradley MRN: [id number] DOB: 05-11-1951   Cancelled Treatment:    Reason Eval/Treat Not Completed: Patient declined, no reason specified;Other (comment) (wanted to eat his lunch) Pt greeted supine in bed with lunch tray just arrived, pt initially seemed like he was willing to set aside tray to work with OT however once everything set up pt reports "well I really don't want to, can you come back tomorrow?" Will continue to follow.   Harley Alto., COTA/L Acute Rehabilitation Services [phone]  Precious Haws 05/21/2022, 2:01 PM

## 2022-05-21 NOTE — Assessment & Plan Note (Addendum)
Reduced LV systolic function with frequent re hospitalizations.   Urine output is 5,465 ml Systolic blood pressure is 140 to 150 mmHg.  Diuresis with furosemide drip at 120 mg tid.  After load reduction with Bidil.  Continue with carvedilol.   Volume status is improving.   Acute respiratory failure due to pulmonary edema (acute) due to heart failure and volume overload. Continue supplemental 02 per .

## 2022-05-21 NOTE — Assessment & Plan Note (Signed)
Respiratory faiur

## 2022-05-21 NOTE — Progress Notes (Signed)
Heart Failure Nurse Navigator Progress Note    Patient HF TOC hospital follow up appointment rescheduled for 06/03/2022 @ 11 am.    [name], BSN, RN Heart Failure Transport planner Only

## 2022-05-21 NOTE — Assessment & Plan Note (Signed)
No signs of exacerbation  

## 2022-05-21 NOTE — Progress Notes (Signed)
Admit: 05/17/2022 LOS: 4  45M AoCKD4, AoC HFrEF, chronic illness requiring bed/wheelchair  Subjective:  2.8L UOP, 1.9L net negative and 7.7L neg through admission  Weights now coming down some 2L [place] He has no c/o Stable SCr 3.50, K 4.7 Alone in room Says HD is 'his decision'  08/22 0701 - 08/23 0700 In: 813.9 [P.O.:498; IV Piggyback:315.9] Out: 2750 [Urine:2750]  Filed Weights   05/20/22 0515 05/21/22 0021 05/21/22 0523  Weight: 128.7 kg 126.7 kg 124.5 kg    Scheduled Meds:  apixaban  5 mg Oral BID   carvedilol  12.5 mg Oral BID   finasteride  5 mg Oral Daily   insulin aspart  0-9 Units Subcutaneous Q4H   isosorbide-hydrALAZINE  1 tablet Oral TID   lacosamide  100 mg Oral BID   levETIRAcetam  750 mg Oral BID   mometasone-formoterol  2 puff Inhalation BID   senna-docusate  1 tablet Oral BID   sodium chloride flush  3 mL Intravenous Q12H   Continuous Infusions:  sodium chloride     cefTRIAXone (ROCEPHIN)  IV 1 g (05/20/22 2149)   furosemide 120 mg (05/21/22 0827)   PRN Meds:.sodium chloride, acetaminophen **OR** acetaminophen, ALPRAZolam, bisacodyl, HYDROcodone-acetaminophen, ipratropium-albuterol, melatonin, mouth rinse, polyethylene glycol, sodium chloride flush  Current Labs: reviewed    Physical Exam:  Blood pressure 119/73, pulse 74, temperature 97.7 F (36.5 C), temperature source Oral, resp. rate 18, height 6\' 2"  (1.88 m), weight 124.5 kg, SpO2 97 %. GEN: Chronically ill-appearing, lying in bed, resting comfortably ENT: NCAT, poor dentition EYES: EOMI CV: Regular, normal S1 and S2 PULM: Diminished in the bases ABD: Distended, nontender SKIN: No rashes or lesions EXT: 2-3+ edema bilaterally in the legs and thighs including sacral region/hips  A AoCKD4: Likely due to decompensated heart failure, need to achieve euvolemia.  Not sure if he would accept HD if/when indicated.  Have offered palliative eval he was not ready.  Diuresing ok, not great.   Cont for  now. Follows with the [facility] nephrology. AoC HFrEF, hypervolemic and congested.  Recurrent.  Continue Lasix as below.  Continue sodium restriction. Hypertension, blood pressure stable DM 2 COPD Anemia, mild Hb 9s at this time Hx/o DVT on DOAC ? UTI on ceftriaxone per TRH  P Cont Diuretics at current dosing; remains hypervolemic Pt considering likey need for HD sooner/later, no decision made If desired, offered palliative consult Daily weights, Daily Renal Panel, Strict I/Os, Avoid nephrotoxins (NSAIDs, judicious IV Contrast)  Medication Issues; Preferred narcotic agents for pain control are hydromorphone, fentanyl, and methadone. Morphine should not be used.  Baclofen should be avoided Avoid oral sodium phosphate and magnesium citrate based laxatives / bowel preps    Pearson Grippe MD 05/21/2022, 11:07 AM  Recent Labs  Lab 05/18/22 1125 05/19/22 0735 05/20/22 0412 05/21/22 0451  NA 136 136 137 136  K 4.8 5.0 5.1 4.7  CL 104 105 105 103  CO2 23 23 20* 23  GLUCOSE 184* 117* 210* 127*  BUN 71* 80* 83* 85*  CREATININE 3.33* 3.60* 3.52* 3.50*  CALCIUM 9.2 8.9 8.8* 9.2  PHOS 5.1*  --   --   --     Recent Labs  Lab 05/17/22 2050 05/17/22 2251 05/18/22 0554 05/19/22 0735 05/20/22 0412  WBC 5.0  --  4.9 4.4 4.8  NEUTROABS 2.7  --   --   --   --   HGB 10.5*   < > 10.1* 9.6* 9.3*  HCT 34.2*   < >  32.3* 30.4* 28.2*  MCV 91.2  --  89.7 88.1 87.0  PLT 289  --  305 245 257   < > = values in this interval not displayed.

## 2022-05-21 NOTE — Assessment & Plan Note (Signed)
Continue with keppra and lacosamide.

## 2022-05-21 NOTE — Assessment & Plan Note (Addendum)
Patient was placed on insulin therapy for glucose cover and monitoring His glucose remained well controlled.  At the time of his discharge his fasting glucose is 126 mg/dl.

## 2022-05-21 NOTE — Assessment & Plan Note (Signed)
Continue antibiotic therapy with ceftriaxone

## 2022-05-21 NOTE — Hospital Course (Addendum)
Tim Bradley was admitted to the hospital with the working diagnosis of heart failure decompensation.   71 yo male with heart failure COPD and CKD stage IV who had a recent hospitalization for heart failure and transferred to SNF. After his discharge he had progressive dyspnea and increased abdominal edema, 02 saturation was 90% and EMS was called, patient was placed on Cpap and transported back to the hospital. On his initial physical examination his blood pressure was 161/101, HR 76. RR 26 and 02 saturation 99% on supplemental 02 per Bip[facility]. Lungs with no rales or wheezing, heart with S1 and S2 present and regular, abdomen with distention, and positive lower extremity edema.   Patient was placed on IV diuresis with slow improvement in his symptoms.  Nephrology was consulted.   08/26 Volume status is improving.  08/27 patient has been transitioned to oral torsemide, if renal function stable plan for discharge to SNF in 24 hrs.

## 2022-05-21 NOTE — TOC Initial Note (Signed)
Transition of Care [facility]) - Initial/Assessment Note    Patient Details  Name: Tim Bradley MRN: [id number] Date of Birth: 02/18/1951  [facility] of [facility]) CM/SW Contact:    Bethann Berkshire, [place] Phone Number: 05/21/2022, 2:47 PM  Clinical Narrative:                  Pt admitted from [facility]. Contacted [name] liaison and confirmed pt can return when medically ready.   Expected Discharge Plan: Skilled Nursing Facility Barriers to Discharge: Continued Medical Work up   Patient Goals and CMS Choice        Expected Discharge Plan and Services Expected Discharge Plan: [place] arrangements for the past 2 months: [place]                                      Prior Living Arrangements/Services Living arrangements for the past 2 months: [place] Lives with:: Facility Resident                   Activities of Daily Living   ADL Screening (condition at time of admission) Patient's cognitive ability adequate to safely complete daily activities?: Yes Is the patient deaf or have difficulty hearing?: No Does the patient have difficulty seeing, even when wearing glasses/contacts?: No Does the patient have difficulty concentrating, remembering, or making decisions?: No Patient able to express need for assistance with ADLs?: Yes Does the patient have difficulty dressing or bathing?: Yes Independently performs ADLs?: No Does the patient have difficulty walking or climbing stairs?: Yes Weakness of Legs: Both Weakness of Arms/Hands: None  Permission Sought/Granted                  Emotional Assessment              Admission diagnosis:  CKD (chronic kidney disease) stage 4, GFR 15-29 ml/min (HCC) [N18.4] Acute on chronic combined systolic and diastolic CHF (congestive heart failure) (HCC) [I50.43] Acute respiratory failure, unspecified whether with hypoxia or hypercapnia (HCC)  [J96.00] Acute on chronic congestive heart failure, unspecified heart failure type ([place]) [I50.9] Patient Active Problem List   Diagnosis Date Noted   Acute respiratory failure with hypoxia ([place]) 05/18/2022   UTI (urinary tract infection) 05/18/2022   Acute on chronic combined systolic and diastolic CHF (congestive heart failure) ([place]) 05/17/2022   Hyperkalemia 05/17/2022   Diabetes mellitus without complication ([place])    Class 2 obesity    History of prostate cancer 04/25/2022   Obesity (BMI 30-39.9) 04/25/2022   Recurrent seizures ([place]) 02/20/2022   E coli bacteremia 01/29/2022   Situational anxiety 01/26/2022   Physical deconditioning 01/26/2022   Anemia of chronic disease 01/25/2022   Sepsis secondary to UTI ([place]) 01/24/2022   Acute cystitis 01/24/2022   Class 1 obesity 12/22/2021   Acute on chronic systolic heart failure ([place]) 12/21/2021   DNR (do not resuscitate) 12/21/2021   DVT (deep venous thrombosis) ([place]) 12/21/2021   Substance induced mood disorder ([place]) 10/27/2020   Right leg DVT ([place]) 09/30/2020   Seizure disorder ([place]) 80/32/1224   Chronic systolic CHF (congestive heart failure) ([place]) 09/28/2020   AKI (acute kidney injury) ([place]) 09/28/2020   Pressure injury of skin 05/03/2020   Acute encephalopathy 05/02/2020   Diarrhea 05/02/2020   Acute on chronic systolic CHF (congestive heart failure) ([place]) 04/22/2020  Hypertension 04/11/2020   Leg edema 04/11/2020   Dyspnea 04/11/2020   Acute on chronic congestive heart failure ([place]) 04/11/2020   BPH (benign prostatic hyperplasia) 04/11/2020   Acute kidney injury superimposed on chronic kidney disease ([place]) 04/11/2020   Generalized weakness 04/11/2020   CKD (chronic kidney disease) stage 4, GFR 15-29 ml/min (HCC) 04/11/2020   Chronic deep vein thrombosis (DVT) ([place]) 04/11/2020   COPD (chronic obstructive pulmonary disease) ([place]) 04/11/2020   Seizures ([place]) 04/11/2020   Seizure ([place]) 07/21/2019   Cocaine abuse ([place])     Constipation 08/22/2018   Gastritis 08/20/2018   Heme positive stool    Acute blood loss anemia secondary to massive gastric ulcer    Hyponatremia    Abscess, gluteal, left 03/26/2017   Type 2 diabetes mellitus with hyperlipidemia ([place]) 03/26/2017   Adrenal nodule ([place]) 03/26/2017   Closed fracture of body of thoracic vertebra ([place]) 03/26/2017   Spinal stenosis of lumbar region 03/26/2017   PCP:  [facility]:   [facility]- [place], [place] - [address] [place] [address] Phone: [phone] Fax: [phone]     Social Determinants of Health (SDOH) Interventions    Readmission Risk Interventions    10/01/2020   12:53 PM  Readmission Risk Prevention Plan  Transportation Screening Complete  Medication Review (RN Care Manager) Complete  PCP or Specialist appointment within 3-5 days of discharge Complete  HRI or [place] Complete  SW Recovery Care/Counseling Consult Complete  Palliative Care Screening Complete  [place] Patient Refused

## 2022-05-21 NOTE — Assessment & Plan Note (Addendum)
Hyponatremia, hyperphosphatemia  08/25 and 08/26 metolazone 5 mg   Her volume status has improved, no dyspnea and improved lower extremity edema  Renal function with serum cr at 3,99, K is 4,1 and serum bicarbonate at 29. BUN is 126, P 5,5 Na138  Patient with no uremic symptoms.  Plan to continue diuresis with torsemide 100 mg and follow up renal function as outpatient.  He is not interested in having renal replacement therapy.

## 2022-05-21 NOTE — Progress Notes (Signed)
Speech Language Pathology Treatment: Dysphagia  Patient Details Name: Tim Bradley MRN: [id number] DOB: 07/11/1951 Today's Date: 05/21/2022 Time: 0349-1791 SLP Time Calculation (min) (ACUTE ONLY): 15 min  Assessment / Plan / Recommendation Clinical Impression  Pt was seen for dysphagia treatment. [name] RN reported that the pt has been tolerating the current diet without difficulty. Pt stated that he has been less short of breath and he denied any difficulty with solids or liquids. Pt tolerated regular texture solids, dual consistency boluses (i.e., peaches with thin liquids), and thin liquids via straw without overt s/s of aspiration. Pt was able to demonstrate adequate tolerance despite his talking on the phone while self-feeding, and masticating. Oral phase was [facility] and no instances of dyspnea were noted during the session. [name] diet will be advanced to regular texture solids and thin liquids. SLP will continue to follow pt.     HPI HPI: Pt is a 71 y.o. male who presented on 05/17/2022 with SOB. CXR 8/19: Cardiomegaly with vascular congestion and probable early interstitial edema. 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      SLP Plan  Continue with current plan of care      Recommendations for follow up therapy are one component of a multi-disciplinary discharge planning process, led by the attending physician.  Recommendations may be updated based on patient status, additional functional criteria and insurance authorization.    Recommendations  Diet recommendations: Regular;Thin liquid Liquids provided via: Cup;Straw Medication Administration: Whole meds with liquid Supervision: Patient able to self feed Compensations: Slow rate;Small sips/bites (rest breaks if dyspneic) Postural Changes and/or Swallow Maneuvers: Seated upright 90 degrees                Oral Care Recommendations: Oral care  BID Follow Up Recommendations:  (TBD) SLP Visit Diagnosis: Dysphagia, unspecified (R13.10) Plan: Continue with current plan of care         Von Quintanar I. Hardin Negus, [place], [place] Office number [phone]   Tim Bradley  05/21/2022, 11:19 AM

## 2022-05-21 NOTE — Progress Notes (Signed)
  Progress Note   Patient: Tim Bradley DOB: 1950-12-13 DOA: 05/17/2022     4 DOS: the patient was seen and examined on 05/21/2022   Brief hospital course: Tim Bradley was admitted to the hospital with the working diagnosis of heart failure decompensation.   71 yo male with heart failure COPD and CKD stage IV who had a recent hospitalization for heart failure and transferred to SNF. After his discharge he had progressive dyspnea and increased abdominal edema, 02 saturation was 90% and EMS was called, patient was placed on Cpap and transported back to the hospital. On his initial physical examination his blood pressure was 161/101, HR 76. RR 26 and 02 saturation 99% on supplemental 02 per Bipap. Lungs with no rales or wheezing, heart with S1 and S2 present and regular, abdomen with distention, and positive lower extremity edema.   Patient was placed on IV diuresis with improvement in his symptoms.  Nephrology was consulted   Assessment and Plan: * Acute on chronic combined systolic and diastolic CHF (congestive heart failure) (HCC) Reduced LV systolic function with frequent re hospitalizations.   Urine output is 7,867 ml Systolic blood pressure is 119 to 150 mmHg.  Plan to continue diuresis with furosemide drip at 120 mg tid.  After load reduction with Bidil.  Continue with carvedilol.   Acute respiratory failure due to pulmonary edema (acute) due to heart failure and volume overload. Continue supplemental 02 per [place].    CKD (chronic kidney disease) stage 4, GFR 15-29 ml/min (HCC) Renal function with serum cr at 3,50 with K at 4,7 and serum bicarbonate at 23. Continue close follow up on renal function and electrolytes, continue aggressive diuresis.   Type 2 diabetes mellitus with hyperlipidemia (HCC) Continue insulin sliding scale for glucose cover and monitoring.   DVT (deep venous thrombosis) (HCC) Continue anticoagulation with apixaban.   COPD (chronic obstructive  pulmonary disease) (HCC) No signs of exacerbation   Seizure disorder ([place]) Continue with keppra and lacosamide.   UTI (urinary tract infection) Continue antibiotic therapy with ceftriaxone         Subjective: Patient with no chest pain, dyspnea is improving.   Physical Exam: Vitals:   05/21/22 0800 05/21/22 1000 05/21/22 1200 05/21/22 1227  BP:    (!) 150/76  Pulse:    70  Resp: 18 20 19    Temp:    98.2 F (36.8 C)  TempSrc:    Oral  SpO2:    99%  Weight:      Height:       Neurology awake and alert ENT with mild pallor Cardiovascular with S1 and S2 present and rhythmic Respiratory with scattered rales Abdomen not tender Positive lower extremity edema more right than left.  Data Reviewed:    Family Communication: no family at the bedside   Disposition: Status is: Inpatient Remains inpatient appropriate because: heart failure and renal failure   Planned Discharge Destination: Home      Author: Tawni Millers, MD 05/21/2022 4:31 PM  For on call review [email or web address].

## 2022-05-21 NOTE — Progress Notes (Signed)
Heart Failure Stewardship Pharmacist Progress Note   PCP: [facility] PCP-Cardiologist: Jenkins Rouge, MD    HPI:  71 yo M with PMH of CHF, NSTEMI, HTN, DVT, seizures, T2DM, OSA, wheelchair bound due to severe spinal stenosis, and prostate cancer.    He was initially diagnosed with HF in 03/2020 when he presented to the hospital complaining of leg edema and weakness. ECHO at that time with EF <20% and RV mildly reduced. He was diuresed and left AMA on 04/13/20. He was discharged with lasix, carvedilol, and BiDil in the setting of AKI.    He presented back to the hospital on 04/21/20 with acute decompensated HF. He stated at this encounter that he had not picked up the medications due to financial reasons. He was diuresed and discharged on the same medications.    He was back at the hospital in 04/2020 with acute CHF and suspected seizure. He underwent stress test at that time. Significant perfusion defect of the entire anterior and inferior wall that is slightly worse at stress. However, does not appear to be severely abnormal, and distribution does not explain global hypokinesis. Likely infarct with small amount of worsening ischemia with stress, but cardiomyopathy out of proportion to ischemia/infarction. Diuresed and discharged with carvedilol, BiDil, and lasix PRN.   Hospitalized in 11/2021 with acute CHF. He ran out of home diuretics. Diuresed and discharged with torsemide, carvedilol, and restarted BiDil. ECHO from OSH in Feb 2023 with EF 40-45%.   Hospitalized from 7/27-8/3 with acute CHF. Diuresed and discharged to SNF with lasix 60 mg daily, carvedilol, and BiDil.  Hospitalized from 8/13-8/17 with acute CHF. Reported that urine output was decreasing and O2 requirements increasing at SNF. Question if he was getting lasix PTA. Diuresed and discharged back to SNF.   Presented to the ED on 8/19 with shortness of breath and edema. CXR with cardiomegaly and vascular congestion. Renal  consulted.  Current HF Medications: Diuretic: furosemide 120 mg IV TID Beta Blocker: carvedilol 12.5 mg BID Other: BiDil 1 tab TID  Prior to admission HF Medications (per SNF): Diuretic: furosemide 40 mg daily Beta blocker: carvedilol 12.5 mg BID ** not taking BiDil  Pertinent Lab Values: Serum creatinine 3.50, BUN 85, Potassium 4.7, Sodium 136, BNP 132.8, Magnesium 2.1, A1c 6.9   Vital Signs: Weight: 274 lbs (admission weight: 279 lbs) Blood pressure: 120-150/60s Heart rate: 60-70s I/O: -2.8L yesterday; net -7.7L  Medication Assistance / Insurance Benefits Check: Does the patient have prescription insurance?  Yes Type of insurance plan: Medicare + [facility]  Outpatient Pharmacy:  Prior to admission outpatient pharmacy: SNF  Assessment: 1. Acute on chronic systolic CHF (LVEF 91-50%), due to NICM. NYHA class III symptoms. - Continue furosemide 120 mg IV TID. Strict I/Os. Daily weights. Keep K>4 and Mag>2. - Continue carvedilol 12.5 mg BID - No ACE/ARB/ARNI, MRA, or SGLT2i with AKI on advanced CKD (eGFR <20) - Continue BiDil 20/37.5 mg 1 tab TID   Plan: 1) Medication changes recommended at this time: - Continue IV diuresis  2) Patient assistance: - None needed - SNF at discharge  Kerby Nora, PharmD, BCPS Heart Failure Stewardship Pharmacist Phone [phone]

## 2022-05-21 NOTE — Assessment & Plan Note (Signed)
Continue anticoagulation with apixaban.  ?

## 2022-05-21 NOTE — Progress Notes (Signed)
Patient has been refusing Bipap.  Patient sleeping, no distress noted at this time.

## 2022-05-22 DIAGNOSIS — E669 Obesity, unspecified: Secondary | ICD-10-CM | POA: Diagnosis present

## 2022-05-22 DIAGNOSIS — N400 Enlarged prostate without lower urinary tract symptoms: Secondary | ICD-10-CM | POA: Diagnosis not present

## 2022-05-22 DIAGNOSIS — N184 Chronic kidney disease, stage 4 (severe): Secondary | ICD-10-CM | POA: Diagnosis not present

## 2022-05-22 DIAGNOSIS — E1169 Type 2 diabetes mellitus with other specified complication: Secondary | ICD-10-CM | POA: Diagnosis not present

## 2022-05-22 DIAGNOSIS — I5043 Acute on chronic combined systolic (congestive) and diastolic (congestive) heart failure: Secondary | ICD-10-CM | POA: Diagnosis not present

## 2022-05-22 LAB — GLUCOSE, CAPILLARY
Glucose-Capillary: 144 mg/dL — ABNORMAL HIGH (ref 70–99)
Glucose-Capillary: 155 mg/dL — ABNORMAL HIGH (ref 70–99)
Glucose-Capillary: 159 mg/dL — ABNORMAL HIGH (ref 70–99)
Glucose-Capillary: 160 mg/dL — ABNORMAL HIGH (ref 70–99)
Glucose-Capillary: 161 mg/dL — ABNORMAL HIGH (ref 70–99)
Glucose-Capillary: 196 mg/dL — ABNORMAL HIGH (ref 70–99)

## 2022-05-22 LAB — BASIC METABOLIC PANEL
Anion gap: 11 (ref 5–15)
BUN: 88 mg/dL — ABNORMAL HIGH (ref 8–23)
CO2: 22 mmol/L (ref 22–32)
Calcium: 8.7 mg/dL — ABNORMAL LOW (ref 8.9–10.3)
Chloride: 101 mmol/L (ref 98–111)
Creatinine, Ser: 3.64 mg/dL — ABNORMAL HIGH (ref 0.61–1.24)
GFR, Estimated: 17 mL/min — ABNORMAL LOW (ref >60)
Glucose, Bld: 216 mg/dL — ABNORMAL HIGH (ref 70–99)
Potassium: 4.6 mmol/L (ref 3.5–5.1)
Sodium: 134 mmol/L — ABNORMAL LOW (ref 135–145)

## 2022-05-22 MED ORDER — OXYCODONE HCL 5 MG PO TABS: 5.0000 mg | ORAL_TABLET | ORAL | Status: DC | PRN

## 2022-05-22 MED ORDER — HYDROCODONE-ACETAMINOPHEN 5-325 MG PO TABS
1.0000 | ORAL_TABLET | Freq: Three times a day (TID) | ORAL | Status: DC | PRN
  Administered 2022-05-22 – 2022-05-26 (×10): 1 via ORAL
  Filled 2022-05-22 (×10): qty 1

## 2022-05-22 NOTE — Plan of Care (Signed)
  Problem: Nutrition: Goal: Adequate nutrition will be maintained Outcome: Completed/Met

## 2022-05-22 NOTE — Progress Notes (Signed)
Admit: 05/17/2022 LOS: 5  69M AoCKD4, AoC HFrEF, chronic illness requiring bed/wheelchair  Subjective:  No interval changes SCr 3.6 stable, K 4.6 3L UOP, neg neg 8.3L from admit Weights down 5kg? Still some edema  08/23 0701 - 08/24 0700 In: 1200.8 [P.O.:1080; IV Piggyback:120.8] Out: 2950 [Urine:2950]  Filed Weights   05/21/22 0021 05/21/22 0523 05/22/22 0406  Weight: 126.7 kg 124.5 kg 123.8 kg    Scheduled Meds:  apixaban  5 mg Oral BID   carvedilol  12.5 mg Oral BID   finasteride  5 mg Oral Daily   insulin aspart  0-9 Units Subcutaneous Q4H   isosorbide-hydrALAZINE  1 tablet Oral TID   lacosamide  100 mg Oral BID   levETIRAcetam  750 mg Oral BID   mometasone-formoterol  2 puff Inhalation BID   senna-docusate  1 tablet Oral BID   sodium chloride flush  3 mL Intravenous Q12H   Continuous Infusions:  sodium chloride     cefTRIAXone (ROCEPHIN)  IV Stopped (05/22/22 6734)   furosemide 120 mg (05/22/22 1058)   PRN Meds:.sodium chloride, acetaminophen **OR** acetaminophen, ALPRAZolam, bisacodyl, HYDROcodone-acetaminophen, ipratropium-albuterol, melatonin, mouth rinse, polyethylene glycol, sodium chloride flush  Current Labs: reviewed    Physical Exam:  Blood pressure (!) 141/67, pulse 77, temperature 97.6 F (36.4 C), temperature source Oral, resp. rate (!) 22, height 6\' 2"  (1.88 m), weight 123.8 kg, SpO2 96 %. GEN: Chronically ill-appearing, lying in bed, resting comfortably ENT: NCAT, poor dentition EYES: EOMI CV: Regular, normal S1 and S2 PULM: Diminished in the bases ABD: Distended, nontender SKIN: No rashes or lesions EXT: 2-3+ edema bilaterally in the legs and thighs including sacral region/hips  A AoCKD4: Likely due to decompensated heart failure, need to achieve euvolemia.  Not sure if he would accept HD if/when indicated.  Have offered palliative eval he was not ready.  Diuresing ok, not great.   Cont for now. Not ready for PO diuretics.  [facility]  nephrology. AoC HFrEF, hypervolemic and congested.  Recurrent.  Continue Lasix as above.  Continue sodium restriction. Hypertension, blood pressure stable DM 2 COPD Anemia, mild Hb 9s at this time Hx/o DVT on DOAC ? UTI s/p ceftriaxone per TRH  P Cont Diuretics at current dosing; remains hypervolemic Pt considering likey need for HD sooner/later, no decision made If desired, offered palliative consult Daily weights, Daily Renal Panel, Strict I/Os, Avoid nephrotoxins (NSAIDs, judicious IV Contrast)  Medication Issues; Preferred narcotic agents for pain control are hydromorphone, fentanyl, and methadone. Morphine should not be used.  Baclofen should be avoided Avoid oral sodium phosphate and magnesium citrate based laxatives / bowel preps    Pearson Grippe MD 05/22/2022, 11:17 AM  Recent Labs  Lab 05/18/22 1125 05/19/22 0735 05/20/22 0412 05/21/22 0451 05/22/22 0443  NA 136   < > 137 136 134*  K 4.8   < > 5.1 4.7 4.6  CL 104   < > 105 103 101  CO2 23   < > 20* 23 22  GLUCOSE 184*   < > 210* 127* 216*  BUN 71*   < > 83* 85* 88*  CREATININE 3.33*   < > 3.52* 3.50* 3.64*  CALCIUM 9.2   < > 8.8* 9.2 8.7*  PHOS 5.1*  --   --   --   --    < > = values in this interval not displayed.    Recent Labs  Lab 05/17/22 2050 05/17/22 2251 05/18/22 1937 05/19/22 9024 05/20/22 0973  WBC 5.0  --  4.9 4.4 4.8  NEUTROABS 2.7  --   --   --   --   HGB 10.5*   < > 10.1* 9.6* 9.3*  HCT 34.2*   < > 32.3* 30.4* 28.2*  MCV 91.2  --  89.7 88.1 87.0  PLT 289  --  305 245 257   < > = values in this interval not displayed.

## 2022-05-22 NOTE — Progress Notes (Signed)
Physical Therapy Treatment Patient Details Name: Tim Bradley MRN: [id number] DOB: [birth year] Today's Date: 05/22/2022   History of Present Illness 71 y.o. male presents to [facility] hospital from SNF on 05/17/2022 with SOB. PMH:  chronic systolic heart failure, CKD4, NSTEMI, hypertension, DVT , DM2, OSA, prostate ca, WC bound due to severe stenosis    PT Comments    Pt continues to require significant assist with all mobility. Working on bed mobility and sit to stand to improve transfer. Continue to recommend SNF for further rehab.    Recommendations for follow up therapy are one component of a multi-disciplinary discharge planning process, led by the attending physician.  Recommendations may be updated based on patient status, additional functional criteria and insurance authorization.  Follow Up Recommendations  Skilled nursing-short term rehab (<3 hours/day) Can patient physically be transported by private vehicle: No   Assistance Recommended at Discharge Frequent or constant Supervision/Assistance  Patient can return home with the following A lot of help with bathing/dressing/bathroom;Assistance with cooking/housework;Assist for transportation;Help with stairs or ramp for entrance;Two people to help with walking and/or transfers   Equipment Recommendations  None recommended by PT    Recommendations for Other Services       Precautions / Restrictions Precautions Precautions: Fall Precaution Comments: watch O2, on 2.5 L during eval Restrictions Weight Bearing Restrictions: No     Mobility  Bed Mobility Overal bed mobility: Needs Assistance Bed Mobility: Supine to Sit     Supine to sit: Mod assist, +2 for physical assistance     General bed mobility comments: Assist to bring legs off EOB, elevate trunk into sitting and bring hips to EOB    Transfers Overall transfer level: Needs assistance Equipment used: Ambulation equipment used Transfers: Sit to/from Stand, Bed to  chair/wheelchair/BSC Sit to Stand: +2 physical assistance, Mod assist           General transfer comment: Assist to bring hips up and for balance. Verbal/tactile cues to extend hips/trunk Transfer via Lift Equipment: Stedy  Ambulation/Gait             Pre-gait activities: Stood with Stedy x 90 sec with min assist to maintain for [employer] Mobility    Modified Rankin (Stroke Patients Only)       Balance Overall balance assessment: Needs assistance Sitting-balance support: Feet supported, Bilateral upper extremity supported Sitting balance-Leahy Scale: Poor Sitting balance - Comments: Min assist and BUE support to maintain static sitting EOB   Standing balance support: Bilateral upper extremity supported Standing balance-Leahy Scale: Poor Standing balance comment: Stedy and min assist for static standing                            Cognition Arousal/Alertness: Awake/alert Behavior During Therapy: WFL for tasks assessed/performed Overall Cognitive Status: Within Functional Limits for tasks assessed                                          Exercises      General Comments General comments (skin integrity, edema, etc.): VSS on 3L      Pertinent Vitals/Pain Pain Assessment Pain Assessment: Faces Faces Pain Scale: No hurt    Home Living  Prior Function            PT Goals (current goals can now be found in the care plan section) Acute Rehab PT Goals Patient Stated Goal: return to SNF Progress towards PT goals: Progressing toward goals    Frequency    Min 2X/week      PT Plan Current plan remains appropriate;Frequency needs to be updated    Co-evaluation              AM-PAC PT "6 Clicks" Mobility   Outcome Measure  Help needed turning from your back to your side while in a flat bed without using bedrails?: A Lot Help needed moving  from lying on your back to sitting on the side of a flat bed without using bedrails?: Total Help needed moving to and from a bed to a chair (including a wheelchair)?: Total Help needed standing up from a chair using your arms (e.g., wheelchair or bedside chair)?: Total Help needed to walk in hospital room?: Total Help needed climbing 3-5 steps with a railing? : Total 6 Click Score: 7    End of Session Equipment Utilized During Treatment: Oxygen Activity Tolerance: Patient limited by fatigue Patient left: with call bell/phone within reach;in chair Nurse Communication: Mobility status;Need for lift equipment PT Visit Diagnosis: Unsteadiness on feet (R26.81);Muscle weakness (generalized) (M62.81);Difficulty in walking, not elsewhere classified (R26.2)     Time: 7048-8891 PT Time Calculation (min) (ACUTE ONLY): 20 min  Charges:  $Therapeutic Activity: 8-22 mins                     [place] 05/22/2022, 1:41 PM

## 2022-05-22 NOTE — Progress Notes (Signed)
Heart Failure Stewardship Pharmacist Progress Note   PCP: [facility] PCP-Cardiologist: Jenkins Rouge, MD    HPI:  71 yo M with PMH of CHF, NSTEMI, HTN, DVT, seizures, T2DM, OSA, wheelchair bound due to severe spinal stenosis, and prostate cancer.    He was initially diagnosed with HF in 03/2020 when he presented to the hospital complaining of leg edema and weakness. ECHO at that time with EF <20% and RV mildly reduced. He was diuresed and left AMA on 04/13/20. He was discharged with lasix, carvedilol, and BiDil in the setting of AKI.    He presented back to the hospital on 04/21/20 with acute decompensated HF. He stated at this encounter that he had not picked up the medications due to financial reasons. He was diuresed and discharged on the same medications.    He was back at the hospital in 04/2020 with acute CHF and suspected seizure. He underwent stress test at that time. Significant perfusion defect of the entire anterior and inferior wall that is slightly worse at stress. However, does not appear to be severely abnormal, and distribution does not explain global hypokinesis. Likely infarct with small amount of worsening ischemia with stress, but cardiomyopathy out of proportion to ischemia/infarction. Diuresed and discharged with carvedilol, BiDil, and lasix PRN.   Hospitalized in 11/2021 with acute CHF. He ran out of home diuretics. Diuresed and discharged with torsemide, carvedilol, and restarted BiDil. ECHO from OSH in Feb 2023 with EF 40-45%.   Hospitalized from 7/27-8/3 with acute CHF. Diuresed and discharged to SNF with lasix 60 mg daily, carvedilol, and BiDil.  Hospitalized from 8/13-8/17 with acute CHF. Reported that urine output was decreasing and O2 requirements increasing at SNF. Question if he was getting lasix PTA. Diuresed and discharged back to SNF.   Presented to the ED on 8/19 with shortness of breath and edema. CXR with cardiomegaly and vascular congestion. Renal  consulted.  Current HF Medications: Diuretic: furosemide 120 mg IV TID Beta Blocker: carvedilol 12.5 mg BID Other: BiDil 1 tab TID  Prior to admission HF Medications (per SNF): Diuretic: furosemide 40 mg daily Beta blocker: carvedilol 12.5 mg BID ** not taking BiDil  Pertinent Lab Values: Serum creatinine 3.64, BUN 88, Potassium 4.6, Sodium 134, BNP 132.8, Magnesium 2.1, A1c 6.9   Vital Signs: Weight: 274 lbs (admission weight: 279 lbs) Blood pressure: 140/60s Heart rate: 60-70s I/O: -3.8L yesterday; net -8.4L  Medication Assistance / Insurance Benefits Check: Does the patient have prescription insurance?  Yes Type of insurance plan: Medicare + [facility]  Outpatient Pharmacy:  Prior to admission outpatient pharmacy: SNF  Assessment: 1. Acute on chronic systolic CHF (LVEF 17-49%), due to NICM. NYHA class III symptoms. - Still volume overloaded on exam. Continue furosemide 120 mg IV TID. Strict I/Os. Daily weights. Keep K>4 and Mag>2. - Continue carvedilol 12.5 mg BID - No ACE/ARB/ARNI, MRA, or SGLT2i with AKI on advanced CKD (eGFR <20) - Continue BiDil 20/37.5 mg 1 tab TID   Plan: 1) Medication changes recommended at this time: - Continue IV diuresis  2) Patient assistance: - None needed - SNF at discharge  Kerby Nora, PharmD, BCPS Heart Failure Stewardship Pharmacist Phone [phone]

## 2022-05-22 NOTE — Assessment & Plan Note (Signed)
Calculated BMI is 35

## 2022-05-22 NOTE — Progress Notes (Signed)
Patient refused the use of BIPAP for the evening RT will continue to monitor.

## 2022-05-22 NOTE — Progress Notes (Signed)
Progress Note   Patient: Tim Bradley CHE:[id number] DOB: Jul 16, 1951 DOA: 05/17/2022     5 DOS: the patient was seen and examined on 05/22/2022   Brief hospital course: Tim Bradley was admitted to the hospital with the working diagnosis of heart failure decompensation.   71 yo male with heart failure COPD and CKD stage IV who had a recent hospitalization for heart failure and transferred to SNF. After his discharge he had progressive dyspnea and increased abdominal edema, 02 saturation was 90% and EMS was called, patient was placed on Cpap and transported back to the hospital. On his initial physical examination his blood pressure was 161/101, HR 76. RR 26 and 02 saturation 99% on supplemental 02 per Bipap. Lungs with no rales or wheezing, heart with S1 and S2 present and regular, abdomen with distention, and positive lower extremity edema.   Patient was placed on IV diuresis with improvement in his symptoms.  Nephrology was consulted   Assessment and Plan: * Acute on chronic combined systolic and diastolic CHF (congestive heart failure) (HCC) Reduced LV systolic function with frequent re hospitalizations.   Urine output is 5,361 ml Systolic blood pressure is 130 to 140 mmHg.  Plan to continue diuresis with furosemide drip at 120 mg tid.  After load reduction with Bidil.  Continue with carvedilol.   Volume status is improving.   Acute respiratory failure due to pulmonary edema (acute) due to heart failure and volume overload. Continue supplemental 02 per [place].    CKD (chronic kidney disease) stage 4, GFR 15-29 ml/min (HCC) Hyponatremia,   Renal function with serum cr at 3,64 with K at 4,6 and serum bicarbonate at 22. NA 134  Plan to continue diuresis with high doses of furosemide, volume status is improving but not yet back to baseline.  Follow up renal function in am.   Type 2 diabetes mellitus with hyperlipidemia (HCC) Continue insulin sliding scale for glucose cover and  monitoring.  Fasting glucose is 216 today.   DVT (deep venous thrombosis) (HCC) Continue anticoagulation with apixaban.   COPD (chronic obstructive pulmonary disease) (HCC) No signs of exacerbation   Seizure disorder ([place]) Continue with keppra and lacosamide.   UTI (urinary tract infection) Completed 3 days of antibiotic therapy.   Class 2 obesity Calculated BMI is 35        Subjective: Patient with improvement in edema and dyspnea, positive back pain and intermittent anxiety   Physical Exam: Vitals:   05/22/22 0800 05/22/22 0824 05/22/22 0918 05/22/22 1200  BP:  (!) 141/67  130/80  Pulse:  66 77 77  Resp: 18 17 (!) 22   Temp:  97.6 F (36.4 C)  98.6 F (37 C)  TempSrc:  Oral  Oral  SpO2:  95% 96%   Weight:      Height:       Neurology awake and alert ENT with mild pallor Cardiovascular with S1 and S2 present and rhythmic with no gallops Respiratory with no rales or wheezing Abdomen with no distention  Positive lower extremity edema more right than left.  Data Reviewed:    Family Communication: I spoke with patient's mother and sister at the bedside, we talked in detail about patient's condition, plan of care and prognosis and all questions were addressed.   Disposition: Status is: Inpatient Remains inpatient appropriate because: heart failure and renal failure   Planned Discharge Destination: Skilled nursing facility     Author: Tawni Millers, MD 05/22/2022 2:01 PM  For on  call review [email or web address].

## 2022-05-23 DIAGNOSIS — I5043 Acute on chronic combined systolic (congestive) and diastolic (congestive) heart failure: Secondary | ICD-10-CM | POA: Diagnosis not present

## 2022-05-23 DIAGNOSIS — E669 Obesity, unspecified: Secondary | ICD-10-CM

## 2022-05-23 DIAGNOSIS — N184 Chronic kidney disease, stage 4 (severe): Secondary | ICD-10-CM | POA: Diagnosis not present

## 2022-05-23 DIAGNOSIS — N400 Enlarged prostate without lower urinary tract symptoms: Secondary | ICD-10-CM | POA: Diagnosis not present

## 2022-05-23 DIAGNOSIS — E1169 Type 2 diabetes mellitus with other specified complication: Secondary | ICD-10-CM | POA: Diagnosis not present

## 2022-05-23 LAB — BASIC METABOLIC PANEL
Anion gap: 12 (ref 5–15)
BUN: 107 mg/dL — ABNORMAL HIGH (ref 8–23)
CO2: 24 mmol/L (ref 22–32)
Calcium: 9.3 mg/dL (ref 8.9–10.3)
Chloride: 102 mmol/L (ref 98–111)
Creatinine, Ser: 3.83 mg/dL — ABNORMAL HIGH (ref 0.61–1.24)
GFR, Estimated: 16 mL/min — ABNORMAL LOW (ref >60)
Glucose, Bld: 123 mg/dL — ABNORMAL HIGH (ref 70–99)
Potassium: 4.8 mmol/L (ref 3.5–5.1)
Sodium: 138 mmol/L (ref 135–145)

## 2022-05-23 LAB — GLUCOSE, CAPILLARY
Glucose-Capillary: 151 mg/dL — ABNORMAL HIGH (ref 70–99)
Glucose-Capillary: 111 mg/dL — ABNORMAL HIGH (ref 70–99)
Glucose-Capillary: 174 mg/dL — ABNORMAL HIGH (ref 70–99)
Glucose-Capillary: 194 mg/dL — ABNORMAL HIGH (ref 70–99)
Glucose-Capillary: 113 mg/dL — ABNORMAL HIGH (ref 70–99)

## 2022-05-23 MED ORDER — METOLAZONE 5 MG PO TABS
5.0000 mg | ORAL_TABLET | Freq: Once | ORAL | Status: AC
  Administered 2022-05-23: 5 mg via ORAL
  Filled 2022-05-23: qty 1

## 2022-05-23 MED ORDER — MUSCLE RUB 10-15 % EX CREA
TOPICAL_CREAM | CUTANEOUS | Status: DC | PRN
  Administered 2022-05-23: 1 via TOPICAL
  Filled 2022-05-23: qty 85

## 2022-05-23 NOTE — Progress Notes (Signed)
  Progress Note   Patient: Tim Bradley HTD:[id number] DOB: 09-Sep-1951 DOA: 05/17/2022     6 DOS: the patient was seen and examined on 05/23/2022   Brief hospital course: Tim Bradley was admitted to the hospital with the working diagnosis of heart failure decompensation.   71 yo male with heart failure COPD and CKD stage IV who had a recent hospitalization for heart failure and transferred to SNF. After his discharge he had progressive dyspnea and increased abdominal edema, 02 saturation was 90% and EMS was called, patient was placed on Cpap and transported back to the hospital. On his initial physical examination his blood pressure was 161/101, HR 76. RR 26 and 02 saturation 99% on supplemental 02 per Bipap. Lungs with no rales or wheezing, heart with S1 and S2 present and regular, abdomen with distention, and positive lower extremity edema.   Patient was placed on IV diuresis with improvement in his symptoms.  Nephrology was consulted.    Assessment and Plan: * Acute on chronic combined systolic and diastolic CHF (congestive heart failure) (HCC) Reduced LV systolic function with frequent re hospitalizations.   Urine output is 7,262 ml Systolic blood pressure is 130 to 140 mmHg.  Diuresis with furosemide drip at 120 mg tid.  After load reduction with Bidil.  Continue with carvedilol.   Volume status is improving.   Acute respiratory failure due to pulmonary edema (acute) due to heart failure and volume overload. Continue supplemental 02 per [place].    CKD (chronic kidney disease) stage 4, GFR 15-29 ml/min (HCC) Hyponatremia,   His renal function today shows a serum cr of  3,8 with K at 4,8 and serum bicarbonate at 24.  Plan to continue furosemide for diuresis,  Added metolazone 5 mg today.   Follow up renal function in am, avoid hypotension or nephrotoxic medications.    Type 2 diabetes mellitus with hyperlipidemia (HCC) Continue insulin sliding scale for glucose cover and  monitoring.  Fasting glucose is 123 mg/dl.   DVT (deep venous thrombosis) (HCC) Continue anticoagulation with apixaban.   COPD (chronic obstructive pulmonary disease) (HCC) No signs of exacerbation   Seizure disorder ([place]) Continue with keppra and lacosamide.   UTI (urinary tract infection) Completed 3 days of antibiotic therapy.   Class 2 obesity Calculated BMI is 35        Subjective: Patient is feeling better, dyspnea and lower extremity is improving.   Physical Exam: Vitals:   05/22/22 1939 05/22/22 2014 05/23/22 0428 05/23/22 1100  BP: 133/74  (!) 147/89 136/66  Pulse: 81 79 75 72  Resp: 20 18 18 16   Temp: 97.8 F (36.6 C)  97.8 F (36.6 C) 97.6 F (36.4 C)  TempSrc: Oral  Oral Oral  SpO2: 96%  96% 92%  Weight:   125.2 kg   Height:       Neurology awake and alert ENT with mild pallor Cardiovascular with S1 and S2 present and rhythmic with no gallops Respiratory with no rales or wheezing Abdomen with no distention  Improved lower extremity edema more right than left.    Data Reviewed:    Family Communication: no family at the bedside   Disposition: Status is: Inpatient Remains inpatient appropriate because: heart failure   Planned Discharge Destination: Skilled nursing facility     Author: Tawni Millers, MD 05/23/2022 1:15 PM  For on call review [email or web address].

## 2022-05-23 NOTE — Progress Notes (Signed)
Occupational Therapy Treatment Patient Details Name: Tim Bradley MRN: [id number] DOB: Jul 24, 1951 Today's Date: 05/23/2022   History of present illness 71 y.o. male presents to [facility] hospital from SNF on 05/17/2022 with SOB. PMH:  chronic systolic heart failure, CKD4, NSTEMI, hypertension, DVT , DM2, OSA, prostate ca, WC bound due to severe stenosis   OT comments  Pt making limited progress this session due to neck pain and lethargy from increased medications overnight. Pt unable to tolerate OOB this session. OT provided manual techniques to lessen cervical pain and assist with positioning. OT will continue to follow acutely.    Recommendations for follow up therapy are one component of a multi-disciplinary discharge planning process, led by the attending physician.  Recommendations may be updated based on patient status, additional functional criteria and insurance authorization.    Follow Up Recommendations  Skilled nursing-short term rehab (<3 hours/day)    Assistance Recommended at Discharge Frequent or constant Supervision/Assistance  Patient can return home with the following  Two people to help with walking and/or transfers;Two people to help with bathing/dressing/bathroom;Assistance with cooking/housework;Assistance with feeding;Direct supervision/assist for medications management;Direct supervision/assist for financial management;Assist for transportation   Equipment Recommendations  None recommended by OT    Recommendations for Other Services      Precautions / Restrictions Precautions Precautions: Fall Precaution Comments: watch O2 Restrictions Weight Bearing Restrictions: No       Mobility Bed Mobility Overal bed mobility: Needs Assistance             General bed mobility comments: Mod A to long sit in bed to reposition    Transfers                   General transfer comment: Deferred due to pt lethargy     Balance                                            ADL either performed or assessed with clinical judgement   ADL Overall ADL's : Needs assistance/impaired Eating/Feeding: Set up;Sitting Eating/Feeding Details (indicate cue type and reason): Set up in chair position for dinner Grooming: Wash/dry hands;Wash/dry face;Set up;Sitting Grooming Details (indicate cue type and reason): completed in chair position                               General ADL Comments: Limited due to neck pain and lethargy    Extremity/Trunk Assessment              Vision       Perception     Praxis      Cognition Arousal/Alertness: Awake/alert Behavior During Therapy: WFL for tasks assessed/performed Overall Cognitive Status: Within Functional Limits for tasks assessed                                          Exercises Exercises: Other exercises Other Exercises Other Exercises: Cervical stretching - rotation, tilting, and flex/ext Other Exercises: manual massage, trigger point, and myofascial release of cervical muscles and trapezius    Shoulder Instructions       General Comments VSS on 3L    Pertinent Vitals/ Pain       Pain Assessment Pain Assessment: Faces Faces  Pain Scale: Hurts even more Pain Location: neck on R side Pain Descriptors / Indicators: Aching, Grimacing Pain Intervention(s): Utilized relaxation techniques, Repositioned, Limited activity within patient's tolerance  Home Living                                          Prior Functioning/Environment              Frequency  Min 2X/week        Progress Toward Goals  OT Goals(current goals can now be found in the care plan section)  Progress towards OT goals: Progressing toward goals  Acute Rehab OT Goals Patient Stated Goal: To relieve pain in neck OT Goal Formulation: With patient Time For Goal Achievement: 06/01/22 Potential to Achieve Goals: Good ADL Goals Pt Will Perform  Lower Body Bathing: with mod assist;with adaptive equipment;sitting/lateral leans;sit to/from stand Pt Will Perform Lower Body Dressing: with mod assist;sitting/lateral leans;sit to/from stand;with adaptive equipment Pt Will Transfer to Toilet: with mod assist;with +2 assist;stand pivot transfer Pt/caregiver will Perform Home Exercise Program: Increased strength;Both right and left upper extremity;With theraband Additional ADL Goal #1: Pt will complete all aspects of bed mobility with min A as a precursor to seated ADL's  Plan Discharge plan remains appropriate;Frequency remains appropriate    Co-evaluation                 AM-PAC OT "6 Clicks" Daily Activity     Outcome Measure   Help from another person eating meals?: A Little Help from another person taking care of personal grooming?: A Little Help from another person toileting, which includes using toliet, bedpan, or urinal?: Total Help from another person bathing (including washing, rinsing, drying)?: A Lot Help from another person to put on and taking off regular upper body clothing?: A Little Help from another person to put on and taking off regular lower body clothing?: A Lot 6 Click Score: 14    End of Session Equipment Utilized During Treatment: Oxygen  OT Visit Diagnosis: Unsteadiness on feet (R26.81);Other abnormalities of gait and mobility (R26.89);Muscle weakness (generalized) (M62.81)   Activity Tolerance Patient limited by fatigue   Patient Left in bed;with call bell/phone within reach;with bed alarm set   Nurse Communication Mobility status        Time: 3009-2330 OT Time Calculation (min): 15 min  Charges: OT General Charges $OT Visit: 1 Visit OT Treatments $Therapeutic Activity: 8-22 mins  Paulita Fujita, OTR/L  [place] 05/23/2022, 6:15 PM

## 2022-05-23 NOTE — Progress Notes (Signed)
Speech Language Pathology Treatment: Dysphagia  Patient Details Name: Tim Bradley MRN: [id number] DOB: 1950-11-16 Today's Date: 05/23/2022 Time: 6294-7654 SLP Time Calculation (min) (ACUTE ONLY): 13 min  Assessment / Plan / Recommendation Clinical Impression  Pt seen for diet tolerance this date with RN reporting no difficulties with meals/meds. She does report that he has been lethargic and requiring frequent cueing to remain alert during PO intake. With repositioning and verbal cueing, pt initially adequately alert for POs. He became intermittently lethargy throughout session with eyes closed, then opening and sighing stating he was trying to stay awake. Meds taken whole with water were without signs of aspiration, although x1 pill remained in oral cavity post initial swallow without awareness. RN cued for secondary liquid wash, which cleared oral cavity. Pt consumed thin liquids by straw demonstrating delayed coughing in x2 instances across multiple trials. Regular and mixed consistency solids unremarkable for signs of aspiration. Question impact of lethargy on overall presentation this date. Recommend continue current diet with adherence to universal swallow precautions. SLP to continue f/u.     HPI HPI: Pt is a 71 y.o. male who presented on 05/17/2022 with SOB. CXR 8/19: Cardiomegaly with vascular congestion and probable early interstitial edema. 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      SLP Plan  Continue with current plan of care      Recommendations for follow up therapy are one component of a multi-disciplinary discharge planning process, led by the attending physician.  Recommendations may be updated based on patient status, additional functional criteria and insurance authorization.    Recommendations  Diet recommendations: Regular;Thin liquid Liquids provided via: Cup;Straw Medication  Administration: Whole meds with liquid Supervision: Patient able to self feed Compensations: Slow rate;Small sips/bites (rest breaks if dyspneic) Postural Changes and/or Swallow Maneuvers: Seated upright 90 degrees                Oral Care Recommendations: Oral care BID Follow Up Recommendations: Other (comment) (TBD) Assistance recommended at discharge: Intermittent Supervision/Assistance SLP Visit Diagnosis: Dysphagia, unspecified (R13.10) Plan: Continue with current plan of care            Tim Bradley, [place], [place] Office Number: [phone]  Tim Bradley  05/23/2022, 2:24 PM

## 2022-05-23 NOTE — Progress Notes (Signed)
Heart Failure Stewardship Pharmacist Progress Note   PCP: [facility] PCP-Cardiologist: Jenkins Rouge, MD    HPI:  71 yo M with PMH of CHF, NSTEMI, HTN, DVT, seizures, T2DM, OSA, wheelchair bound due to severe spinal stenosis, and prostate cancer.    He was initially diagnosed with HF in 03/2020 when he presented to the hospital complaining of leg edema and weakness. ECHO at that time with EF <20% and RV mildly reduced. He was diuresed and left AMA on 04/13/20. He was discharged with lasix, carvedilol, and BiDil in the setting of AKI.    He presented back to the hospital on 04/21/20 with acute decompensated HF. He stated at this encounter that he had not picked up the medications due to financial reasons. He was diuresed and discharged on the same medications.    He was back at the hospital in 04/2020 with acute CHF and suspected seizure. He underwent stress test at that time. Significant perfusion defect of the entire anterior and inferior wall that is slightly worse at stress. However, does not appear to be severely abnormal, and distribution does not explain global hypokinesis. Likely infarct with small amount of worsening ischemia with stress, but cardiomyopathy out of proportion to ischemia/infarction. Diuresed and discharged with carvedilol, BiDil, and lasix PRN.   Hospitalized in 11/2021 with acute CHF. He ran out of home diuretics. Diuresed and discharged with torsemide, carvedilol, and restarted BiDil. ECHO from OSH in Feb 2023 with EF 40-45%.   Hospitalized from 7/27-8/3 with acute CHF. Diuresed and discharged to SNF with lasix 60 mg daily, carvedilol, and BiDil.  Hospitalized from 8/13-8/17 with acute CHF. Reported that urine output was decreasing and O2 requirements increasing at SNF. Question if he was getting lasix PTA. Diuresed and discharged back to SNF.   Presented to the ED on 8/19 with shortness of breath and edema. CXR with cardiomegaly and vascular congestion. Renal  consulted.  Current HF Medications: Diuretic: furosemide 120 mg IV TID Beta Blocker: carvedilol 12.5 mg BID Other: BiDil 1 tab TID  Prior to admission HF Medications (per SNF): Diuretic: furosemide 40 mg daily Beta blocker: carvedilol 12.5 mg BID ** not taking BiDil  Pertinent Lab Values: Serum creatinine 3.83, BUN 107, Potassium 4.8, Sodium 138, BNP 132.8, Magnesium 2.1, A1c 6.9   Vital Signs: Weight: 276 lbs (admission weight: 279 lbs) Blood pressure: 140/80s Heart rate: 70s I/O: -1.7L yesterday; net -9.3L  Medication Assistance / Insurance Benefits Check: Does the patient have prescription insurance?  Yes Type of insurance plan: Medicare + [facility]  Outpatient Pharmacy:  Prior to admission outpatient pharmacy: SNF  Assessment: 1. Acute on chronic systolic CHF (LVEF 25-36%), due to NICM. NYHA class III symptoms. - Output trended down yesterday. Weight up 4 lbs on furosemide 120 mg IV TID. Although recording as bed weights - question accuracy. Strict I/Os. Daily weights. Keep K>4 and Mag>2. Ongoing conversations with renal about HD.  - Continue carvedilol 12.5 mg BID - No ACE/ARB/ARNI, MRA, or SGLT2i with AKI on advanced CKD (eGFR <20) - Continue BiDil 20/37.5 mg 1 tab TID   Plan: 1) Medication changes recommended at this time: - Continue current regimen - diuresis per renal  2) Patient assistance: - None needed - SNF at discharge  Kerby Nora, PharmD, BCPS Heart Failure Stewardship Pharmacist Phone [phone]

## 2022-05-23 NOTE — Progress Notes (Signed)
Admit: 05/17/2022 LOS: 6  3M AoCKD4, AoC HFrEF, chronic illness requiring bed/wheelchair  Subjective:  No interval changes 1.8L UOP yesterday, 9.2L neg from admit SCr 3.6 stable, K 4.6 3L UOP, neg neg 8.3L from admit Still some edema but is overall improved  08/24 0701 - 08/25 0700 In: 915.8 [P.O.:560; IV Piggyback:355.8] Out: 1771 [Urine:1770; Stool:1]  Filed Weights   05/21/22 0523 05/22/22 0406 05/23/22 0428  Weight: 124.5 kg 123.8 kg 125.2 kg    Scheduled Meds:  apixaban  5 mg Oral BID   carvedilol  12.5 mg Oral BID   finasteride  5 mg Oral Daily   insulin aspart  0-9 Units Subcutaneous Q4H   isosorbide-hydrALAZINE  1 tablet Oral TID   lacosamide  100 mg Oral BID   levETIRAcetam  750 mg Oral BID   mometasone-formoterol  2 puff Inhalation BID   senna-docusate  1 tablet Oral BID   sodium chloride flush  3 mL Intravenous Q12H   Continuous Infusions:  sodium chloride     cefTRIAXone (ROCEPHIN)  IV 1 g (05/22/22 2056)   furosemide 120 mg (05/22/22 2218)   PRN Meds:.sodium chloride, acetaminophen **OR** acetaminophen, ALPRAZolam, bisacodyl, HYDROcodone-acetaminophen, ipratropium-albuterol, melatonin, Muscle Rub, mouth rinse, polyethylene glycol, sodium chloride flush  Current Labs: reviewed    Physical Exam:  Blood pressure (!) 147/89, pulse 75, temperature 97.8 F (36.6 C), temperature source Oral, resp. rate 18, height 6\' 2"  (1.88 m), weight 125.2 kg, SpO2 96 %. GEN: Chronically ill-appearing, lying in bed, resting comfortably ENT: NCAT, poor dentition EYES: EOMI CV: Regular, normal S1 and S2 PULM: Diminished in the bases ABD: Distended, nontender SKIN: No rashes or lesions EXT: 2-3+ edema bilaterally in the legs and thighs including sacral region/hips  A AoCKD4: Likely due to decompensated heart failure, need to achieve euvolemia.  Not sure if he would accept HD if/when indicated.  Have offered palliative eval he was not ready.  Diuresing ok, not great.   Give  metolazone 5mg  PO today.  Not ready for PO diuretics.  Follows with the [facility]rology. AoC HFrEF, hypervolemic and congested.  Recurrent.  Continue Lasix as above.  Continue sodium restriction. Hypertension, blood pressure stable DM 2 COPD Anemia, mild Hb 9s at this time Hx/o DVT on DOAC ? UTI s/p ceftriaxone per TRH  P Cont lasix at current dosing and add metolazone 5mg  today; remains hypervolemic Pt considering likey need for HD sooner/later, no decision made If desired, offered palliative consult Daily weights, Daily Renal Panel, Strict I/Os, Avoid nephrotoxins (NSAIDs, judicious IV Contrast)  Medication Issues; Preferred narcotic agents for pain control are hydromorphone, fentanyl, and methadone. Morphine should not be used.  Baclofen should be avoided Avoid oral sodium phosphate and magnesium citrate based laxatives / bowel preps    Pearson Grippe MD 05/23/2022, 10:58 AM  Recent Labs  Lab 05/18/22 1125 05/19/22 0735 05/21/22 0451 05/22/22 0443 05/23/22 0457  NA 136   < > 136 134* 138  K 4.8   < > 4.7 4.6 4.8  CL 104   < > 103 101 102  CO2 23   < > 23 22 24   GLUCOSE 184*   < > 127* 216* 123*  BUN 71*   < > 85* 88* 107*  CREATININE 3.33*   < > 3.50* 3.64* 3.83*  CALCIUM 9.2   < > 9.2 8.7* 9.3  PHOS 5.1*  --   --   --   --    < > = values in this interval  not displayed.    Recent Labs  Lab 05/17/22 2050 05/17/22 2251 05/18/22 0554 05/19/22 0735 05/20/22 0412  WBC 5.0  --  4.9 4.4 4.8  NEUTROABS 2.7  --   --   --   --   HGB 10.5*   < > 10.1* 9.6* 9.3*  HCT 34.2*   < > 32.3* 30.4* 28.2*  MCV 91.2  --  89.7 88.1 87.0  PLT 289  --  305 245 257   < > = values in this interval not displayed.

## 2022-05-23 NOTE — Progress Notes (Signed)
Orthopedic Tech Progress Note Patient Details:  Tim Bradley 1950-12-07 [id number]  Patient ID: Tim Bradley, male   DOB: 31-Aug-1951, 71 y.o.   MRN: [id number] Attempted to apply unna boots, but patient stated he was anxious about having them and that he had not been given his anxiety medicine today. He requested we come back after he receives his anxiety medicine. RN notified. Vernona Rieger 05/23/2022, 5:33 PM

## 2022-05-24 DIAGNOSIS — N184 Chronic kidney disease, stage 4 (severe): Secondary | ICD-10-CM | POA: Diagnosis not present

## 2022-05-24 DIAGNOSIS — E1169 Type 2 diabetes mellitus with other specified complication: Secondary | ICD-10-CM | POA: Diagnosis not present

## 2022-05-24 DIAGNOSIS — I5043 Acute on chronic combined systolic (congestive) and diastolic (congestive) heart failure: Secondary | ICD-10-CM | POA: Diagnosis not present

## 2022-05-24 DIAGNOSIS — N400 Enlarged prostate without lower urinary tract symptoms: Secondary | ICD-10-CM | POA: Diagnosis not present

## 2022-05-24 LAB — GLUCOSE, CAPILLARY
Glucose-Capillary: 136 mg/dL — ABNORMAL HIGH (ref 70–99)
Glucose-Capillary: 149 mg/dL — ABNORMAL HIGH (ref 70–99)
Glucose-Capillary: 156 mg/dL — ABNORMAL HIGH (ref 70–99)
Glucose-Capillary: 139 mg/dL — ABNORMAL HIGH (ref 70–99)
Glucose-Capillary: 199 mg/dL — ABNORMAL HIGH (ref 70–99)
Glucose-Capillary: 181 mg/dL — ABNORMAL HIGH (ref 70–99)
Glucose-Capillary: 152 mg/dL — ABNORMAL HIGH (ref 70–99)

## 2022-05-24 LAB — BASIC METABOLIC PANEL
Anion gap: 11 (ref 5–15)
BUN: 113 mg/dL — ABNORMAL HIGH (ref 8–23)
CO2: 25 mmol/L (ref 22–32)
Calcium: 9.1 mg/dL (ref 8.9–10.3)
Chloride: 100 mmol/L (ref 98–111)
Creatinine, Ser: 3.63 mg/dL — ABNORMAL HIGH (ref 0.61–1.24)
GFR, Estimated: 17 mL/min — ABNORMAL LOW (ref >60)
Glucose, Bld: 166 mg/dL — ABNORMAL HIGH (ref 70–99)
Potassium: 4.5 mmol/L (ref 3.5–5.1)
Sodium: 136 mmol/L (ref 135–145)

## 2022-05-24 MED ORDER — METOLAZONE 5 MG PO TABS
5.0000 mg | ORAL_TABLET | Freq: Once | ORAL | Status: AC
  Administered 2022-05-24: 5 mg via ORAL
  Filled 2022-05-24: qty 1

## 2022-05-24 NOTE — Progress Notes (Signed)
Bipap not indicated at this time. Pts vitals are stable, RT will continue to monitor.

## 2022-05-24 NOTE — NC FL2 (Signed)
[place] LEVEL OF CARE SCREENING TOOL     IDENTIFICATION  Patient Name: Tim Bradley Birthdate: January 25, 1951 Sex: male Admission Date (Current Location): 05/17/2022  [facility] and [place] Number:      Facility and Address:         Provider Number:    Attending Physician Name and Address:  Arrien, Jimmy Picket,*  Relative Name and Phone Number:       Current Level of Care:   Recommended Level of Care:   Prior Approval Number:    Date Approved/Denied:   PASRR Number:    Discharge Plan:      Current Diagnoses: Patient Active Problem List   Diagnosis Date Noted   Class 2 obesity 05/22/2022   UTI (urinary tract infection) 05/18/2022   Acute on chronic combined systolic and diastolic CHF (congestive heart failure) ([place]) 05/17/2022   Diabetes mellitus without complication ([place])    History of prostate cancer 04/25/2022   Obesity (BMI 30-39.9) 04/25/2022   Recurrent seizures ([place]) 02/20/2022   E coli bacteremia 01/29/2022   Situational anxiety 01/26/2022   Physical deconditioning 01/26/2022   Sepsis secondary to UTI ([place]) 01/24/2022   Acute cystitis 01/24/2022   Class 1 obesity 12/22/2021   Acute on chronic systolic heart failure ([place]) 12/21/2021   DNR (do not resuscitate) 12/21/2021   DVT (deep venous thrombosis) ([place]) 12/21/2021   Substance induced mood disorder ([place]) 10/27/2020   Right leg DVT ([place]) 09/30/2020   Seizure disorder ([place]) 16/06/9603   Chronic systolic CHF (congestive heart failure) ([place]) 09/28/2020   AKI (acute kidney injury) ([place]) 09/28/2020   Pressure injury of skin 05/03/2020   Acute encephalopathy 05/02/2020   Diarrhea 05/02/2020   Acute on chronic systolic CHF (congestive heart failure) ([place]) 04/22/2020   Hypertension 04/11/2020   Leg edema 04/11/2020   Dyspnea 04/11/2020   Acute on chronic congestive heart failure ([place]) 04/11/2020   BPH (benign prostatic hyperplasia) 04/11/2020   Acute kidney injury superimposed on  chronic kidney disease ([place]) 04/11/2020   Generalized weakness 04/11/2020   CKD (chronic kidney disease) stage 4, GFR 15-29 ml/min (HCC) 04/11/2020   Chronic deep vein thrombosis (DVT) ([place]) 04/11/2020   COPD (chronic obstructive pulmonary disease) ([place]) 04/11/2020   Seizures ([place]) 04/11/2020   Seizure ([place]) 07/21/2019   Cocaine abuse ([place])    Gastritis 08/20/2018   Heme positive stool    Acute blood loss anemia secondary to massive gastric ulcer    Hyponatremia    Abscess, gluteal, left 03/26/2017   Type 2 diabetes mellitus with hyperlipidemia ([place]) 03/26/2017   Adrenal nodule ([place]) 03/26/2017   Closed fracture of body of thoracic vertebra ([place]) 03/26/2017   Spinal stenosis of lumbar region 03/26/2017    Orientation RESPIRATION BLADDER Height & Weight            Weight: 272 lb 7.8 oz (123.6 kg) Height:  6\' 2"  (188 cm)  BEHAVIORAL SYMPTOMS/MOOD NEUROLOGICAL BOWEL NUTRITION STATUS           AMBULATORY STATUS COMMUNICATION OF NEEDS Skin                               Personal Care Assistance Level of Assistance              Functional Limitations Info             SPECIAL CARE FACTORS FREQUENCY  Contractures      Additional Factors Info                  Current Medications (05/24/2022):  This is the current hospital active medication list Current Facility-Administered Medications  Medication Dose Route Frequency Provider Last Rate Last Admin   0.9 %  sodium chloride infusion  250 mL Intravenous PRN Doutova, Anastassia, MD       acetaminophen (TYLENOL) tablet 650 mg  650 mg Oral Q6H PRN Doutova, Anastassia, MD   650 mg at 05/24/22 0147   Or   acetaminophen (TYLENOL) suppository 650 mg  650 mg Rectal Q6H PRN Toy Baker, MD       ALPRAZolam Duanne Moron) tablet 1 mg  1 mg Oral TID PRN Arrien, Jimmy Picket, MD   1 mg at 05/23/22 2051   apixaban (ELIQUIS) tablet 5 mg  5 mg Oral BID Toy Baker, MD   5 mg at  05/24/22 0806   bisacodyl (DULCOLAX) suppository 10 mg  10 mg Rectal Daily PRN Toy Baker, MD       carvedilol (COREG) tablet 12.5 mg  12.5 mg Oral BID Doutova, Anastassia, MD   12.5 mg at 05/24/22 0806   cefTRIAXone (ROCEPHIN) 1 g in sodium chloride 0.9 % 100 mL IVPB  1 g Intravenous QHS Toy Baker, MD   Stopped at 05/23/22 2135   finasteride (PROSCAR) tablet 5 mg  5 mg Oral Daily Doutova, Anastassia, MD   5 mg at 05/24/22 0807   furosemide (LASIX) 120 mg in dextrose 5 % 50 mL IVPB  120 mg Intravenous TID Pearson Grippe B, MD 62 mL/hr at 05/24/22 0511 120 mg at 05/24/22 0511   HYDROcodone-acetaminophen (NORCO/VICODIN) 5-325 MG per tablet 1 tablet  1 tablet Oral TID PRN Tawni Millers, MD   1 tablet at 05/24/22 0807   insulin aspart (novoLOG) injection 0-9 Units  0-9 Units Subcutaneous Q4H Doutova, Anastassia, MD   1 Units at 05/24/22 0807   ipratropium-albuterol (DUONEB) 0.5-2.5 (3) MG/3ML nebulizer solution 3 mL  3 mL Nebulization Q4H PRN Toy Baker, MD   3 mL at 05/20/22 1923   isosorbide-hydrALAZINE (BIDIL) 20-37.5 MG per tablet 1 tablet  1 tablet Oral TID Domenic Polite, MD   1 tablet at 05/24/22 1937   lacosamide (VIMPAT) tablet 100 mg  100 mg Oral BID Domenic Polite, MD   100 mg at 05/24/22 9024   levETIRAcetam (KEPPRA) tablet 750 mg  750 mg Oral BID Domenic Polite, MD   750 mg at 05/24/22 0973   melatonin tablet 10 mg  10 mg Oral QHS PRN Kristopher Oppenheim, DO   10 mg at 05/24/22 0147   metolazone (ZAROXOLYN) tablet 5 mg  5 mg Oral Once [name], MD       mometasone-formoterol [facility]) 200-5 MCG/ACT inhaler 2 puff  2 puff Inhalation BID Toy Baker, MD   2 puff at 05/24/22 5329   Muscle Rub CREA   Topical PRN Arrien, Jimmy Picket, MD   1 Application at 92/42/68 0203   Oral care mouth rinse  15 mL Mouth Rinse PRN Domenic Polite, MD       polyethylene glycol (MIRALAX / GLYCOLAX) packet 17 g  17 g Oral Daily PRN Toy Baker, MD   17 g at  05/18/22 1123   senna-docusate (Senokot-S) tablet 1 tablet  1 tablet Oral BID Domenic Polite, MD   1 tablet at 05/24/22 0807   sodium chloride flush (NS) 0.9 % injection 3 mL  3 mL Intravenous Q12H Doutova, Anastassia, MD   3 mL at 05/24/22 0809   sodium chloride flush (NS) 0.9 % injection 3 mL  3 mL Intravenous PRN Toy Baker, MD   3 mL at 05/20/22 2150     Discharge Medications: Please see discharge summary for a list of discharge medications.  Relevant Imaging Results:  Relevant Lab Results:   Additional Information    Kathreen Dileo Renold Don, LCSWA

## 2022-05-24 NOTE — Progress Notes (Signed)
Progress Note   Patient: Tim Bradley GYI:[id number] DOB: 07-Oct-1950 DOA: 05/17/2022     7 DOS: the patient was seen and examined on 05/24/2022   Brief hospital course: Tim Bradley was admitted to the hospital with the working diagnosis of heart failure decompensation.   71 yo male with heart failure COPD and CKD stage IV who had a recent hospitalization for heart failure and transferred to SNF. After his discharge he had progressive dyspnea and increased abdominal edema, 02 saturation was 90% and EMS was called, patient was placed on Cpap and transported back to the hospital. On his initial physical examination his blood pressure was 161/101, HR 76. RR 26 and 02 saturation 99% on supplemental 02 per Bipap. Lungs with no rales or wheezing, heart with S1 and S2 present and regular, abdomen with distention, and positive lower extremity edema.   Patient was placed on IV diuresis with slow improvement in his symptoms.  Nephrology was consulted.   08/26 Volume status is improving.    Assessment and Plan: * Acute on chronic combined systolic and diastolic CHF (congestive heart failure) (HCC) Reduced LV systolic function with frequent re hospitalizations.   Urine output is 3,500 ml Systolic blood pressure is 140 to 150 mmHg.  Diuresis with furosemide drip at 120 mg tid.  After load reduction with Bidil.  Continue with carvedilol.   Volume status is improving.   Acute respiratory failure due to pulmonary edema (acute) due to heart failure and volume overload. Continue supplemental 02 per [place].    CKD (chronic kidney disease) stage 4, GFR 15-29 ml/min (HCC) Hyponatremia,   Volume status is improving, renal function with serum cr at 3,6 with K at 4,5 and serum bicarbonate at 25. BUN 113   Continue with furosemide 120 mg tid.  08/25 and 08/26 metolazone 5 mg   Follow up renal function in am.     Type 2 diabetes mellitus with hyperlipidemia (HCC) Continue insulin sliding scale for  glucose cover and monitoring.  His glucose has been well controlled.   DVT (deep venous thrombosis) (HCC) Continue anticoagulation with apixaban.   COPD (chronic obstructive pulmonary disease) (HCC) No signs of exacerbation   Seizure disorder ([place]) Continue with keppra and lacosamide.   UTI (urinary tract infection) Completed 3 days of antibiotic therapy.   Class 2 obesity Calculated BMI is 35        Subjective: Patient with improvement in edema, no dyspnea and no chest pain, today positive somnolence at the time of my examination   Physical Exam: Vitals:   05/23/22 1938 05/24/22 0115 05/24/22 0441 05/24/22 0734  BP: (!) 143/84 130/72 (!) 149/73 (!) 154/64  Pulse: 74 69 72 66  Resp: 16 20 20 20   Temp: 97.9 F (36.6 C) (!) 97.2 F (36.2 C) 97.9 F (36.6 C) 97.9 F (36.6 C)  TempSrc: Oral Oral Oral Oral  SpO2: 98% 97% 93% 97%  Weight:  123.6 kg    Height:       Neurology somnolent but easy to arouse ENT with mild pallor Cardiovascular with S1 and S2 present and rhythmic Respiratory with no rales, rhonchi or wheezing on anterior aucultation Abdomen not distended Right lower extremity edema++ and left + non pitting  Data Reviewed:    Family Communication: no family at the bedside   Disposition: Status is: Inpatient Remains inpatient appropriate because: heart failure   Planned Discharge Destination: Skilled nursing facility      Author: Tawni Millers, MD 05/24/2022 1:59 PM  For on call review [email or web address].

## 2022-05-24 NOTE — Progress Notes (Signed)
Admit: 05/17/2022 LOS: 7  58M AoCKD4, AoC HFrEF, chronic illness requiring bed/wheelchair  Subjective:  No interval changes 3.8L UOP yesterday, 11.8 SCr 3.6 stable, K 4.5 Still some edema but is overall improved  08/25 0701 - 08/26 0700 In: 2020 [P.O.:2020] Out: 3800 [Urine:3800]  Filed Weights   05/22/22 0406 05/23/22 0428 05/24/22 0115  Weight: 123.8 kg 125.2 kg 123.6 kg    Scheduled Meds:  apixaban  5 mg Oral BID   carvedilol  12.5 mg Oral BID   finasteride  5 mg Oral Daily   insulin aspart  0-9 Units Subcutaneous Q4H   isosorbide-hydrALAZINE  1 tablet Oral TID   lacosamide  100 mg Oral BID   levETIRAcetam  750 mg Oral BID   mometasone-formoterol  2 puff Inhalation BID   senna-docusate  1 tablet Oral BID   sodium chloride flush  3 mL Intravenous Q12H   Continuous Infusions:  sodium chloride     cefTRIAXone (ROCEPHIN)  IV Stopped (05/23/22 2135)   furosemide 120 mg (05/24/22 0511)   PRN Meds:.sodium chloride, acetaminophen **OR** acetaminophen, ALPRAZolam, bisacodyl, HYDROcodone-acetaminophen, ipratropium-albuterol, melatonin, Muscle Rub, mouth rinse, polyethylene glycol, sodium chloride flush  Current Labs: reviewed    Physical Exam:  Blood pressure (!) 154/64, pulse 66, temperature 97.9 F (36.6 C), temperature source Oral, resp. rate 20, height 6\' 2"  (1.88 m), weight 123.6 kg, SpO2 97 %. GEN: Chronically ill-appearing, lying in bed, resting comfortably ENT: NCAT, poor dentition EYES: EOMI CV: Regular, normal S1 and S2 PULM: Diminished in the bases ABD: Distended, nontender SKIN: No rashes or lesions EXT: 2-3+ edema bilaterally in the legs and thighs including sacral region/hips  A AoCKD4: Likely due to decompensated heart failure, need to achieve euvolemia. Getting closer  Not sure if he would accept HD if/when indicated.  Have offered palliative eval he was not ready.  Diuresing better with addition of metolazone 5mg  daily, cont for another 24h.  Not ready  for PO diuretics.  Follows with the [facility] nephrology. AoC HFrEF, hypervolemic and congested.  Recurrent.  Continue diuretics as above.  Continue sodium restriction. Hypertension, blood pressure stable DM 2 COPD Anemia, mild Hb 9s at this time Hx/o DVT on DOAC ? UTI s/p ceftriaxone per TRH  P Lasix and metolazone for another day Maybe PO diuretics tomorrow Daily weights, Daily Renal Panel, Strict I/Os, Avoid nephrotoxins (NSAIDs, judicious IV Contrast)  Medication Issues; Preferred narcotic agents for pain control are hydromorphone, fentanyl, and methadone. Morphine should not be used.  Baclofen should be avoided Avoid oral sodium phosphate and magnesium citrate based laxatives / bowel preps    Pearson Grippe MD 05/24/2022, 8:41 AM  Recent Labs  Lab 05/18/22 1125 05/19/22 0735 05/22/22 0443 05/23/22 0457 05/24/22 0613  NA 136   < > 134* 138 136  K 4.8   < > 4.6 4.8 4.5  CL 104   < > 101 102 100  CO2 23   < > 22 24 25   GLUCOSE 184*   < > 216* 123* 166*  BUN 71*   < > 88* 107* 113*  CREATININE 3.33*   < > 3.64* 3.83* 3.63*  CALCIUM 9.2   < > 8.7* 9.3 9.1  PHOS 5.1*  --   --   --   --    < > = values in this interval not displayed.    Recent Labs  Lab 05/17/22 2050 05/17/22 2251 05/18/22 0554 05/19/22 0735 05/20/22 0412  WBC 5.0  --  4.9 4.4 4.8  NEUTROABS 2.7  --   --   --   --   HGB 10.5*   < > 10.1* 9.6* 9.3*  HCT 34.2*   < > 32.3* 30.4* 28.2*  MCV 91.2  --  89.7 88.1 87.0  PLT 289  --  305 245 257   < > = values in this interval not displayed.

## 2022-05-25 DIAGNOSIS — N184 Chronic kidney disease, stage 4 (severe): Secondary | ICD-10-CM | POA: Diagnosis not present

## 2022-05-25 DIAGNOSIS — E1169 Type 2 diabetes mellitus with other specified complication: Secondary | ICD-10-CM | POA: Diagnosis not present

## 2022-05-25 DIAGNOSIS — I5043 Acute on chronic combined systolic (congestive) and diastolic (congestive) heart failure: Secondary | ICD-10-CM | POA: Diagnosis not present

## 2022-05-25 DIAGNOSIS — N400 Enlarged prostate without lower urinary tract symptoms: Secondary | ICD-10-CM | POA: Diagnosis not present

## 2022-05-25 LAB — GLUCOSE, CAPILLARY
Glucose-Capillary: 142 mg/dL — ABNORMAL HIGH (ref 70–99)
Glucose-Capillary: 152 mg/dL — ABNORMAL HIGH (ref 70–99)
Glucose-Capillary: 196 mg/dL — ABNORMAL HIGH (ref 70–99)
Glucose-Capillary: 209 mg/dL — ABNORMAL HIGH (ref 70–99)
Glucose-Capillary: 214 mg/dL — ABNORMAL HIGH (ref 70–99)
Glucose-Capillary: 283 mg/dL — ABNORMAL HIGH (ref 70–99)

## 2022-05-25 LAB — BASIC METABOLIC PANEL
Anion gap: 13 (ref 5–15)
BUN: 116 mg/dL — ABNORMAL HIGH (ref 8–23)
CO2: 26 mmol/L (ref 22–32)
Calcium: 9 mg/dL (ref 8.9–10.3)
Chloride: 98 mmol/L (ref 98–111)
Creatinine, Ser: 3.66 mg/dL — ABNORMAL HIGH (ref 0.61–1.24)
GFR, Estimated: 17 mL/min — ABNORMAL LOW (ref >60)
Glucose, Bld: 162 mg/dL — ABNORMAL HIGH (ref 70–99)
Potassium: 4.3 mmol/L (ref 3.5–5.1)
Sodium: 137 mmol/L (ref 135–145)

## 2022-05-25 MED ORDER — TORSEMIDE 100 MG PO TABS
100.0000 mg | ORAL_TABLET | Freq: Every day | ORAL | Status: DC
  Administered 2022-05-25 – 2022-05-26 (×2): 100 mg via ORAL
  Filled 2022-05-25 (×2): qty 1

## 2022-05-25 MED ORDER — METHOCARBAMOL 500 MG PO TABS
500.0000 mg | ORAL_TABLET | Freq: Three times a day (TID) | ORAL | Status: DC | PRN
Start: 2022-05-25 — End: 2022-05-27
  Administered 2022-05-25 – 2022-05-26 (×2): 500 mg via ORAL
  Filled 2022-05-25 (×2): qty 1

## 2022-05-25 NOTE — Progress Notes (Signed)
  Progress Note   Patient: Tim Bradley EYC:[id number] DOB: 08/26/1951 DOA: 05/17/2022     8 DOS: the patient was seen and examined on 05/25/2022   Brief hospital course: Tim Bradley was admitted to the hospital with the working diagnosis of heart failure decompensation.   71 yo male with heart failure COPD and CKD stage IV who had a recent hospitalization for heart failure and transferred to SNF. After his discharge he had progressive dyspnea and increased abdominal edema, 02 saturation was 90% and EMS was called, patient was placed on Cpap and transported back to the hospital. On his initial physical examination his blood pressure was 161/101, HR 76. RR 26 and 02 saturation 99% on supplemental 02 per Bip[facility]. Lungs with no rales or wheezing, heart with S1 and S2 present and regular, abdomen with distention, and positive lower extremity edema.   Patient was placed on IV diuresis with slow improvement in his symptoms.  Nephrology was consulted.   08/26 Volume status is improving.  08/27 patient has been transitioned to oral torsemide, if renal function stable plan for discharge to SNF in 24 hrs.  Assessment and Plan: * Acute on chronic combined systolic and diastolic CHF (congestive heart failure) (HCC) Reduced LV systolic function with frequent re hospitalizations.   Urine output is 1,497 ml Systolic blood pressure is 120 to 140 mmHg.  Transitioned to oral torsemide 100 mg daily  After load reduction with Bidil.  Continue with carvedilol.   Acute respiratory failure due to pulmonary edema (acute) due to heart failure and volume overload. Continue supplemental 02 per Tim Bradley.    CKD (chronic kidney disease) stage 4, GFR 15-29 ml/min (HCC) Hyponatremia,  08/25 and 08/26 metolazone 5 mg  \ Renal function with serum cr at 3,66 with K at 4,3 and serum bicarbonate at 26. Plan to continue diuresis with high dose of torsemide. Follow up renal function in am.        Type 2 diabetes mellitus  with hyperlipidemia (HCC) Continue insulin sliding scale for glucose cover and monitoring.  His glucose has been well controlled.   DVT (deep venous thrombosis) (HCC) Continue anticoagulation with apixaban.   COPD (chronic obstructive pulmonary disease) (HCC) No signs of exacerbation   Seizure disorder (Tim Bradley) Continue with keppra and lacosamide.   UTI (urinary tract infection) Completed 3 days of antibiotic therapy.   Class 2 obesity Calculated BMI is 35        Subjective: Patient is feeling better, his edema continue to improve, along with his lower extremity edema, no chest pain. Tolerating po well.   Physical Exam: Vitals:   05/25/22 0439 05/25/22 0728 05/25/22 0919 05/25/22 0952  BP: 127/69     Pulse: 72     Resp: 18 19  19   Temp: 98.1 F (36.7 C)     TempSrc: Oral     SpO2: 94%  93%   Weight:      Height:       Neurology awake and alert ENT with mild pallor Cardiovascular with S1 and S2 present and rhythmic Respiratory with no rales or wheezing Abdomen with no distention  Lower extremity edema right non pitting and trace on the left.  Data Reviewed:    Family Communication: no family at the bedisde   Disposition: Status is: Inpatient Remains inpatient appropriate because: renal failure   Planned Discharge Destination: Skilled nursing facility    Author: Tawni Millers, MD 05/25/2022 10:52 AM  For on call review [email or web address].

## 2022-05-25 NOTE — Plan of Care (Signed)
  Problem: Elimination: Goal: Will not experience complications related to bowel motility Outcome: Completed/Met Goal: Will not experience complications related to urinary retention Outcome: Completed/Met

## 2022-05-25 NOTE — Progress Notes (Signed)
Admit: 05/17/2022 LOS: 8  38M AoCKD4, AoC HFrEF, chronic illness requiring bed/wheelchair  Subjective:  No interval changes 3.2L UOP yesterday, 12.5L net neg SCr 4.3  stable, K 4.3 Still some edema but is overall improved  08/26 0701 - 08/27 0700 In: 1842 [P.O.:1680; IV Piggyback:162] Out: 3200 [Urine:3200]  Filed Weights   05/23/22 0428 05/24/22 0115 05/25/22 0037  Weight: 125.2 kg 123.6 kg 124.3 kg    Scheduled Meds:  apixaban  5 mg Oral BID   carvedilol  12.5 mg Oral BID   finasteride  5 mg Oral Daily   insulin aspart  0-9 Units Subcutaneous Q4H   isosorbide-hydrALAZINE  1 tablet Oral TID   lacosamide  100 mg Oral BID   levETIRAcetam  750 mg Oral BID   mometasone-formoterol  2 puff Inhalation BID   senna-docusate  1 tablet Oral BID   sodium chloride flush  3 mL Intravenous Q12H   torsemide  100 mg Oral Daily   Continuous Infusions:  sodium chloride     cefTRIAXone (ROCEPHIN)  IV Stopped (05/25/22 0100)   PRN Meds:.sodium chloride, acetaminophen **OR** acetaminophen, ALPRAZolam, bisacodyl, HYDROcodone-acetaminophen, ipratropium-albuterol, melatonin, methocarbamol, Muscle Rub, mouth rinse, polyethylene glycol, sodium chloride flush  Current Labs: reviewed    Physical Exam:  Blood pressure 127/69, pulse 72, temperature 98.1 F (36.7 C), temperature source Oral, resp. rate 19, height 6\' 2"  (1.88 m), weight 124.3 kg, SpO2 94 %. GEN: Chronically ill-appearing, lying in bed, resting comfortably ENT: NCAT, poor dentition EYES: EOMI CV: Regular, normal S1 and S2 PULM: Diminished in the bases ABD: Distended, nontender SKIN: No rashes or lesions EXT: 2-3+ edema bilaterally in the legs and thighs including sacral region/hips  A AoCKD4: Likely due to decompensated heart failure, pretty close to euvolemia. Not sure if he would accept HD if/when indicated.  Have offered palliative eval he was not ready.  Stop IV lasix, give torsemide 100mg  daily today.  If stable tomorrow  prob ok for DC.  Follows with the [facility] nephrology. AoC HFrEF, hypervolemic and congested.  Recurrent.  Diuretics as above.  Continue sodium restriction. Hypertension, blood pressure stable DM 2 COPD Anemia, mild Hb 9s at this time Hx/o DVT on DOAC ? UTI s/p ceftriaxone per TRH  P As above, Torsemide PO today If stable GFR and adequate UOP, can DC tomorrow Daily weights, Daily Renal Panel, Strict I/Os, Avoid nephrotoxins (NSAIDs, judicious IV Contrast)  Medication Issues; Preferred narcotic agents for pain control are hydromorphone, fentanyl, and methadone. Morphine should not be used.  Baclofen should be avoided Avoid oral sodium phosphate and magnesium citrate based laxatives / bowel preps    Pearson Grippe MD 05/25/2022, 9:13 AM  Recent Labs  Lab 05/18/22 1125 05/19/22 0735 05/23/22 0457 05/24/22 0613 05/25/22 0341  NA 136   < > 138 136 137  K 4.8   < > 4.8 4.5 4.3  CL 104   < > 102 100 98  CO2 23   < > 24 25 26   GLUCOSE 184*   < > 123* 166* 162*  BUN 71*   < > 107* 113* 116*  CREATININE 3.33*   < > 3.83* 3.63* 3.66*  CALCIUM 9.2   < > 9.3 9.1 9.0  PHOS 5.1*  --   --   --   --    < > = values in this interval not displayed.    Recent Labs  Lab 05/19/22 0735 05/20/22 0412  WBC 4.4 4.8  HGB 9.6* 9.3*  HCT 30.4*  28.2*  MCV 88.1 87.0  PLT 245 257

## 2022-05-25 NOTE — Progress Notes (Signed)
Pt resting and in distress requiring bipap at this time.  RT will monitor for need.

## 2022-05-26 ENCOUNTER — Encounter (HOSPITAL_COMMUNITY): Payer: No Typology Code available for payment source

## 2022-05-26 DIAGNOSIS — N400 Enlarged prostate without lower urinary tract symptoms: Secondary | ICD-10-CM | POA: Diagnosis not present

## 2022-05-26 DIAGNOSIS — I5043 Acute on chronic combined systolic (congestive) and diastolic (congestive) heart failure: Secondary | ICD-10-CM | POA: Diagnosis not present

## 2022-05-26 DIAGNOSIS — N184 Chronic kidney disease, stage 4 (severe): Secondary | ICD-10-CM | POA: Diagnosis not present

## 2022-05-26 DIAGNOSIS — E1169 Type 2 diabetes mellitus with other specified complication: Secondary | ICD-10-CM | POA: Diagnosis not present

## 2022-05-26 LAB — GLUCOSE, CAPILLARY
Glucose-Capillary: 141 mg/dL — ABNORMAL HIGH (ref 70–99)
Glucose-Capillary: 170 mg/dL — ABNORMAL HIGH (ref 70–99)
Glucose-Capillary: 206 mg/dL — ABNORMAL HIGH (ref 70–99)
Glucose-Capillary: 215 mg/dL — ABNORMAL HIGH (ref 70–99)
Glucose-Capillary: 221 mg/dL — ABNORMAL HIGH (ref 70–99)
Glucose-Capillary: 241 mg/dL — ABNORMAL HIGH (ref 70–99)

## 2022-05-26 LAB — RENAL FUNCTION PANEL
Albumin: 3.4 g/dL — ABNORMAL LOW (ref 3.5–5.0)
Anion gap: 10 (ref 5–15)
BUN: 126 mg/dL — ABNORMAL HIGH (ref 8–23)
CO2: 29 mmol/L (ref 22–32)
Calcium: 9.1 mg/dL (ref 8.9–10.3)
Chloride: 99 mmol/L (ref 98–111)
Creatinine, Ser: 3.99 mg/dL — ABNORMAL HIGH (ref 0.61–1.24)
GFR, Estimated: 15 mL/min — ABNORMAL LOW (ref >60)
Glucose, Bld: 156 mg/dL — ABNORMAL HIGH (ref 70–99)
Phosphorus: 5.5 mg/dL — ABNORMAL HIGH (ref 2.5–4.6)
Potassium: 4.1 mmol/L (ref 3.5–5.1)
Sodium: 138 mmol/L (ref 135–145)

## 2022-05-26 MED ORDER — TORSEMIDE 100 MG PO TABS: 100.0000 mg | ORAL_TABLET | Freq: Every day | ORAL | 0 refills | Status: AC

## 2022-05-26 MED ORDER — HYDROCODONE-ACETAMINOPHEN 5-325 MG PO TABS: 1.0000 | ORAL_TABLET | Freq: Three times a day (TID) | ORAL | 0 refills | Status: DC | PRN

## 2022-05-26 MED ORDER — GUAIFENESIN-DM 100-10 MG/5ML PO SYRP
5.0000 mL | ORAL_SOLUTION | ORAL | Status: DC | PRN
  Administered 2022-05-26: 5 mL via ORAL
  Filled 2022-05-26: qty 5

## 2022-05-26 MED ORDER — POLYETHYLENE GLYCOL 3350 17 G PO PACK: 17.0000 g | PACK | Freq: Every day | ORAL | 0 refills | Status: AC | PRN

## 2022-05-26 MED ORDER — CALCIUM ACETATE (PHOS BINDER) 667 MG PO CAPS
667.0000 mg | ORAL_CAPSULE | Freq: Three times a day (TID) | ORAL | Status: DC
  Administered 2022-05-26: 667 mg via ORAL
  Filled 2022-05-26: qty 1

## 2022-05-26 MED ORDER — CALCIUM ACETATE (PHOS BINDER) 667 MG PO CAPS: 667.0000 mg | ORAL_CAPSULE | Freq: Three times a day (TID) | ORAL | 0 refills | Status: AC

## 2022-05-26 MED ORDER — ISOSORB DINITRATE-HYDRALAZINE 20-37.5 MG PO TABS: 1.0000 | ORAL_TABLET | Freq: Three times a day (TID) | ORAL | 0 refills | Status: AC

## 2022-05-26 NOTE — Progress Notes (Signed)
Attempt made to call report to [facility] - no response.

## 2022-05-26 NOTE — Discharge Summary (Signed)
Physician Discharge Summary   Patient: Tim Bradley MRN: [id number] DOB: November 17, 1950  Admit date:     05/17/2022  Discharge date: 05/26/22  Discharge Physician: Tawni Millers   PCP: [facility]   Recommendations at discharge:    Patient will continue diuresis with oral torsemide Follow up renal function as outpatient in 7 days.  Patient has expressed not willing to undergo renal replacement therapy.  Fluid restriction 1200 to 1500 ml per day, and less than 2 g salt per day.   I spoke over the phone with the patient's sister about patient's  condition, plan of care, prognosis and all questions were addressed.    Discharge Diagnoses: Principal Problem:   Acute on chronic combined systolic and diastolic CHF (congestive heart failure) (HCC) Active Problems:   CKD (chronic kidney disease) stage 4, GFR 15-29 ml/min (HCC)   Type 2 diabetes mellitus with hyperlipidemia (HCC)   BPH (benign prostatic hyperplasia)   DVT (deep venous thrombosis) (HCC)   COPD (chronic obstructive pulmonary disease) (HCC)   Seizure disorder (HCC)   UTI (urinary tract infection)   Class 2 obesity  Resolved Problems:   * No resolved hospital problems. [facility] Course: Mr. Pulido was admitted to the hospital with the working diagnosis of heart failure decompensation.   71 yo male with heart failure COPD and CKD stage IV who had a recent hospitalization for heart failure and transferred to SNF. After his discharge he had progressive dyspnea and increased abdominal edema, 02 saturation was 90% and EMS was called, patient was placed on Cpap and transported back to the hospital. On his initial physical examination his blood pressure was 161/101, HR 76. RR 26 and 02 saturation 99% on supplemental 02 per [facility]. Lungs with no rales or wheezing, heart with S1 and S2 present and regular, abdomen with distention, and positive lower extremity edema.   Na 136, K 5.3, CL 104, bicarbonate 23, glucose 116  bun 68 cr 3,46  BNP 132,8  High sensitive troponin 14 and 13 Wbc 5,0 hgb 10,5 plt 289  Urine analysis with SG 1,015, 100 protein, >50 wbc, 6-10 rbc  Urine culture 100,000 CFU citrobacter.   Chest radiograph with cardiomegaly with bilateral hilar vascular congestion, no effusions.   EKG 71 bpm, left axis deviation, left bundle branch block, qtc 507, sinus rhythm with poor R R progression with no significant ST segment or T wave changes.   Patient was placed on IV diuresis with slow improvement in his symptoms.  Nephrology was consulted.  He required high doses of IV loop diuretic therapy along with oral metolazone.   08/26 Volume status is improving.  08/27 patient has been transitioned to oral torsemide 08/29 patient does not want hemodialysis under no circumstances. His code status is DNR.  Plan to continue with high dose of loop diuretic and follow up renal function closely as outpatient.    Assessment and Plan: * Acute on chronic combined systolic and diastolic CHF (congestive heart failure) (HCC) Echocardiogram with reduced LV systolic function with EF 35 to 40%, global hypokinesis, moderate LVH, RV systolic function is preserved,    Patient required high doses of IV loop diuretics, to achieve improvement in his volume status, 120 mg furosemide IV tid. He has been transitioned to oral torsemide 100 mg daily. At the time of his discharge his volume status has improved.  He is not interested in renal replacement therapy.   Plan to continue diuresis with torsemide, continue after load  reduction with bidil and B blockade with carvedilol. Patient has a poor prognosis.   Acute respiratory failure due to pulmonary edema (acute) due to heart failure and volume overload. Continue supplemental 02 per [place].  At the time of his discharge his 02 saturation is 98% on 3 L/min per [place].    CKD (chronic kidney disease) stage 4, GFR 15-29 ml/min (HCC) Hyponatremia,  08/25 and 08/26 metolazone 5  mg   Her volume status has improved, no dyspnea and improved lower extremity edema  Renal function with serum cr at 3,99, K is 4,1 and serum bicarbonate at 29. BUN is 126  Na138  Patient with no uremic symptoms.  Plan to continue diuresis with torsemide 100 mg and follow up renal function as outpatient.  He is not interested in having renal replacement therapy.   Type 2 diabetes mellitus with hyperlipidemia ([place]) Patient was placed on insulin therapy for glucose cover and monitoring His glucose remained well controlled.  At the time of his discharge his fasting glucose is 126 mg/dl.   DVT (deep venous thrombosis) (HCC) Continue anticoagulation with apixaban.   COPD (chronic obstructive pulmonary disease) (HCC) No signs of exacerbation   Seizure disorder ([place]) Continue with keppra and lacosamide.   UTI (urinary tract infection) Completed 3 days of antibiotic therapy.   Class 2 obesity Calculated BMI is 35         Consultants: nephrology  Procedures performed: none   Disposition: Skilled nursing facility Diet recommendation:  Cardiac and Carb modified diet DISCHARGE MEDICATION: Allergies as of 05/26/2022       Reactions   Gabapentin Other (See Comments)   unknown   Tramadol Other (See Comments)   unknown   Trazodone And Nefazodone Other (See Comments)   Family reports seizures from this medication        Medication List     STOP taking these medications    furosemide 20 MG tablet Commonly known as: LASIX   tizanidine 2 MG capsule Commonly known as: ZANAFLEX       TAKE these medications    acetaminophen 500 MG tablet Commonly known as: TYLENOL Take 1,000 mg by mouth 3 (three) times daily as needed for mild pain.   apixaban 5 MG Tabs tablet Commonly known as: ELIQUIS Take 1 tablet (5 mg total) by mouth 2 (two) times daily.   atorvastatin 80 MG tablet Commonly known as: LIPITOR Take 1 tablet (80 mg total) by mouth daily. What changed: when  to take this   calcium acetate 667 MG capsule Commonly known as: PHOSLO Take 1 capsule (667 mg total) by mouth 3 (three) times daily with meals.   carvedilol 12.5 MG tablet Commonly known as: Coreg Take 1 tablet (12.5 mg total) by mouth 2 (two) times daily.   finasteride 5 MG tablet Commonly known as: PROSCAR Take 1 tablet (5 mg total) by mouth daily.   guaiFENesin 100 MG/5ML liquid Commonly known as: ROBITUSSIN Take 200 mg by mouth every 4 (four) hours as needed for cough or to loosen phlegm. Give 82m (equal to 2070m by mouth every four hours as needed for cough x 72 hours. **up to 240080m4 hours**   HYDROcodone-acetaminophen 5-325 MG tablet Commonly known as: NORCO/VICODIN Take 1 tablet by mouth 3 (three) times daily as needed for moderate pain. What changed:  when to take this additional instructions   ipratropium-albuterol 0.5-2.5 (3) MG/3ML Soln Commonly known as: DUONEB Take 3 mLs by nebulization every 4 (four) hours as needed (shortness  of breath/wheezing).   isosorbide-hydrALAZINE 20-37.5 MG tablet Commonly known as: BIDIL Take 1 tablet by mouth 3 (three) times daily. What changed: how much to take   Lacosamide 100 MG Tabs Take 100 mg by mouth 2 (two) times daily.   levETIRAcetam 750 MG tablet Commonly known as: KEPPRA Take 1 tablet (750 mg total) by mouth 2 (two) times daily.   Magnesium Oxide 420 MG Tabs Take 420 mg by mouth daily.   melatonin 3 MG Tabs tablet Take 1 tablet (3 mg total) by mouth at bedtime.   mometasone-formoterol 200-5 MCG/ACT Aero Commonly known as: DULERA Inhale 2 puffs into the lungs 2 (two) times daily.   naloxone 4 MG/0.1ML Liqd nasal spray kit Commonly known as: NARCAN Place 1 spray into the nose as needed (opioid overdose).   polyethylene glycol 17 g packet Commonly known as: MIRALAX / GLYCOLAX Take 17 g by mouth daily as needed for mild constipation.   QC Vitamin D3 50 MCG (2000 UT) Tabs Generic drug:  Cholecalciferol Take 2,000 Units by mouth daily.   torsemide 100 MG tablet Commonly known as: DEMADEX Take 1 tablet (100 mg total) by mouth daily. Start taking on: May 27, 2022        Follow-up Information     [facility]. Go in 13 day(s).   Specialty: Cardiology Why: Hospital follow up PLEASE bring a current medication list to appointment FREE valet parking, Entrance C, off [employer] information: [address] [id number] [place] [phone]               Discharge Exam: Danley Danker Weights   05/24/22 0115 05/25/22 0037 05/26/22 0400  Weight: 123.6 kg 124.3 kg 121.3 kg   BP (!) 110/54 (BP Location: Right Arm)   Pulse 75   Temp 98.6 F (37 C) (Oral)   Resp 18   Ht 6' 2"  (1.88 m)   Wt 121.3 kg   SpO2 98%   BMI 34.33 kg/m   Patient with no chest pain or dyspnea  Neurology awake and alert ENT with mild pallor Cardiovascular with S1 and S2 present and rhythmic Respiratory with no rales or wheezing Abdomen with no distention  Lower extremity edema on the right + and trace on the left  Condition at discharge: stable  The results of significant diagnostics from this hospitalization (including imaging, microbiology, ancillary and laboratory) are listed below for reference.   Imaging Studies: DG Abd 1 View  Result Date: 05/17/2022 CLINICAL DATA:  Abdominal pain. EXAM: ABDOMEN - 1 VIEW COMPARISON:  Abdominal radiograph dated 09/12/2018. FINDINGS: Evaluation is limited due to body habitus. Moderate stool throughout the colon. No bowel dilatation or evidence of obstruction. No free air or radiopaque calculi. The osseous structures are intact. The soft tissues are grossly unremarkable. IMPRESSION: Moderate colonic stool burden. No bowel obstruction. Electronically Signed   By: Anner Crete M.D.   On: 05/17/2022 23:12   DG Chest Port 1 View  Result Date:  05/17/2022 CLINICAL DATA:  Shortness of breath, respiratory distress EXAM: PORTABLE CHEST 1 VIEW COMPARISON:  05/12/2022 FINDINGS: Cardiomegaly, vascular congestion. Interstitial prominence could reflect interstitial edema. No effusions or acute bony abnormality. Findings similar to prior study. IMPRESSION: Cardiomegaly with vascular congestion and probable early interstitial edema. Findings unchanged. Electronically Signed   By: Rolm Baptise M.D.   On: 05/17/2022 21:09   DG Chest 1 View  Result Date: 05/12/2022 CLINICAL DATA:  CHF. EXAM: CHEST  1 VIEW COMPARISON:  Chest x-rays dated 05/11/2022 and 04/24/2022. FINDINGS: Stable cardiomegaly. Mild central pulmonary vascular congestion and interstitial prominence. No pleural effusion or pneumothorax is seen. Osseous structures about the chest are unremarkable. IMPRESSION: Cardiomegaly with mild central pulmonary vascular congestion and interstitial prominence suggesting mild CHF/volume overload. Electronically Signed   By: Franki Cabot M.D.   On: 05/12/2022 07:43   DG Chest Port 1 View  Result Date: 05/11/2022 CLINICAL DATA:  Shortness of breath EXAM: PORTABLE CHEST 1 VIEW COMPARISON:  April 24, 2022 FINDINGS: Stable cardiomegaly. The hila, mediastinum, and pleura are unchanged. Haziness over the bases is likely due to overlapping soft tissues and portable technique. No suspicious infiltrates or overt edema. The left lung base was not imaged in its entirety. IMPRESSION: No acute abnormalities are identified on this study. Electronically Signed   By: Dorise Bullion III M.D.   On: 05/11/2022 13:33    Microbiology: Results for orders placed or performed during the hospital encounter of 05/17/22  Urine Culture     Status: Abnormal   Collection Time: 05/17/22 10:19 PM   Specimen: Urine, Catheterized  Result Value Ref Range Status   Specimen Description URINE, CATHETERIZED  Final   Special Requests   Final    NONE Performed at [facility],  [address] [place], [place] [address]    Culture >=100,000 COLONIES/mL CITROBACTER KOSERI (A)  Final   Report Status 05/20/2022 FINAL  Final   Organism ID, Bacteria CITROBACTER KOSERI (A)  Final      Susceptibility   Citrobacter koseri - MIC*    CEFAZOLIN <=4 SENSITIVE Sensitive     CEFEPIME <=0.12 SENSITIVE Sensitive     CEFTRIAXONE <=0.25 SENSITIVE Sensitive     CIPROFLOXACIN <=0.25 SENSITIVE Sensitive     GENTAMICIN <=1 SENSITIVE Sensitive     IMIPENEM <=0.25 SENSITIVE Sensitive     NITROFURANTOIN 32 SENSITIVE Sensitive     TRIMETH/SULFA <=20 SENSITIVE Sensitive     PIP/TAZO <=4 SENSITIVE Sensitive     * >=100,000 COLONIES/mL CITROBACTER KOSERI    Labs: CBC: Recent Labs  Lab 05/20/22 0412  WBC 4.8  HGB 9.3*  HCT 28.2*  MCV 87.0  PLT 257   Basic Metabolic Panel: Recent Labs  Lab 05/22/22 0443 05/23/22 0457 05/24/22 0613 05/25/22 0341 05/26/22 0630  NA 134* 138 136 137 138  K 4.6 4.8 4.5 4.3 4.1  CL 101 102 100 98 99  CO2 22 24 25 26 29   GLUCOSE 216* 123* 166* 162* 156*  BUN 88* 107* 113* 116* 126*  CREATININE 3.64* 3.83* 3.63* 3.66* 3.99*  CALCIUM 8.7* 9.3 9.1 9.0 9.1  PHOS  --   --   --   --  5.5*   Liver Function Tests: Recent Labs  Lab 05/26/22 0630  ALBUMIN 3.4*   CBG: Recent Labs  Lab 05/25/22 1953 05/26/22 0033 05/26/22 0431 05/26/22 0843 05/26/22 1154  GLUCAP 283* 221* 241* 215* 206*    Discharge time spent: greater than 30 minutes.  Signed: Tawni Millers, MD [facility] 05/26/2022

## 2022-05-26 NOTE — Progress Notes (Signed)
Heart Failure Nurse Navigator Progress Note  Gave pt HV TOC appt card reminder for 9/5 @ 11AM with parking code. Planned DC today.

## 2022-05-26 NOTE — Progress Notes (Signed)
Report given to Butch [facility], RN at [facility]. Still awaiting PTAR.

## 2022-05-26 NOTE — Progress Notes (Signed)
Speech Language Pathology Treatment: Dysphagia  Patient Details Name: Tim Bradley MRN: [id number] DOB: [birth year] Today's Date: 05/26/2022 Time: 0900-0911 SLP Time Calculation (min) (ACUTE ONLY): 11 min  Assessment / Plan / Recommendation Clinical Impression  RN reports continued toleration of regular diet/thin liquids and meds whole with water, without notation of clinical signs of aspiration. Pt also reports no difficulties with PO intake, having just finished am meal. With pt alert and sitting upright in bed, he self-fed large/consecutive sips of thin liquids and bites of regular textures with x1 delayed congested cough post liquid intake across multiple trials. Congested coughing also heard outside of pt's room several minutes post PO intake. Given RN reports of diet toleration and recent CXR (see HPI) without concern for aspiration/PNA, suspect intermittent/inconsistent cough is unrelated to POs at this time. Recommend continue regular diet/thin liquids with adherence to universal swallow precautions. No further SLP f/u indicated at this time. Please re-consult if changes arise concerning for oropharyngeal dysphagia.   HPI HPI: Pt is a 71 y.o. male who presented on 05/17/2022 with SOB. CXR 8/19: Cardiomegaly with vascular congestion and probable early interstitial edema. 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      SLP Plan  Discharge SLP treatment due to (comment);All goals met      Recommendations for follow up therapy are one component of a multi-disciplinary discharge planning process, led by the attending physician.  Recommendations may be updated based on patient status, additional functional criteria and insurance authorization.    Recommendations  Diet recommendations: Regular;Thin liquid Liquids provided via: Cup;Straw Medication Administration: Whole meds with liquid Supervision: Patient  able to self feed Compensations: Slow rate;Small sips/bites (rest breaks if dyspneic) Postural Changes and/or Swallow Maneuvers: Seated upright 90 degrees                Oral Care Recommendations: Oral care BID Follow Up Recommendations: No SLP follow up Assistance recommended at discharge: Intermittent Supervision/Assistance SLP Visit Diagnosis: Dysphagia, unspecified (R13.10) Plan: Discharge SLP treatment due to (comment);All goals met            [name], [place], [place] Office Number: [phone]  [name]  05/26/2022, 9:16 AM

## 2022-05-26 NOTE — TOC Transition Note (Signed)
Transition of Care [facility]) - CM/SW Discharge Note   Patient Details  Name: Tim Bradley MRN: [id number] Date of Birth: 23-Aug-1951  [facility] CM/SW Contact:  Coralee Pesa, [place] Phone Number: 05/26/2022, 2:02 PM   Clinical Narrative:    Pt to be transported to [facility] via [place].  Nurse to call report to [phone]. Rm# [phone]   Final next level of care: Skilled Nursing Facility Barriers to Discharge: Barriers Resolved   Patient Goals and CMS Choice        Discharge Placement              Patient [facility] bed at: [name] Patient to be transferred to facility by: [place] Name of family member notified: [name] (Legal Guardian) Patient and family notified of of transfer: 05/26/22  Discharge Plan and Services                                     Social Determinants of Health (SDOH) Interventions     Readmission Risk Interventions    10/01/2020   12:53 PM  Readmission Risk Prevention Plan  Transportation Screening Complete  Medication Review ([place]) Complete  PCP or Specialist appointment within 3-5 days of discharge Complete  HRI or [place] Complete  SW Recovery Care/Counseling Consult Complete  Palliative Care Screening Complete  [place] Patient Refused

## 2022-05-26 NOTE — Progress Notes (Signed)
Physical Therapy Treatment Patient Details Name: Tim Bradley MRN: [id number] DOB: 1951/06/23 Today's Date: 05/26/2022   History of Present Illness 71 y.o. male presents to [facility] hospital from SNF on 05/17/2022 with SOB. 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 Pt initially did not want to get up due to head pain but bed was soiled and pt was persuaded to mobilize OOB for the short term. Pt with posterior bias in sitting EOB, needed clise guarding and up to mod A to maintain sitting. Pt on RA for transition to sitting and SPO2 dropped to 85%. 3L O2 left on rest of session and SPO2 upper 90's at rest, 93% with exertion. Worked on sit>stand with stedy as well as maintaining upright position and stretching back. Pt unable to step feet in standing position. PT will continue to follow.    Recommendations for follow up therapy are one component of a multi-disciplinary discharge planning process, led by the attending physician.  Recommendations may be updated based on patient status, additional functional criteria and insurance authorization.  Follow Up Recommendations  Skilled nursing-short term rehab (<3 hours/day) Can patient physically be transported by private vehicle: No   Assistance Recommended at Discharge Frequent or constant Supervision/Assistance  Patient can return home with the following A lot of help with bathing/dressing/bathroom;Assistance with cooking/housework;Assist for transportation;Help with stairs or ramp for entrance;Two people to help with walking and/or transfers   Equipment Recommendations  None recommended by PT    Recommendations for Other Services       Precautions / Restrictions Precautions Precautions: Fall Precaution Comments: monitor O2 Restrictions Weight Bearing Restrictions: No      Mobility  Bed Mobility Overal bed mobility: Needs Assistance Bed Mobility: Supine to Sit, Sit to Supine     Supine to sit: Mod assist, +2 for physical assistance Sit to supine: +2 for physical assistance, Mod assist   General bed mobility comments: Assist for LEs and trunk with transition to sitting and back to supine. Posterior lean throughout    Transfers Overall transfer level: Needs assistance Equipment used: Ambulation equipment used Charlaine Dalton) Transfers: Sit to/from Stand Sit to Stand: +2 physical assistance, Mod assist (with use of Stedy)           General transfer comment: mod A +2 for fwd wt shift and power up. Pt needed slightly less help when standing from flaps of stedy instead of bed. Mod A needed to control descent with sitting Transfer via Lift Equipment: Stedy  Ambulation/Gait               General Gait Details: pt unable to step feet in standing   Stairs             Wheelchair Mobility    Modified Rankin (Stroke Patients Only)       Balance Overall balance assessment: Needs assistance Sitting-balance support: Feet supported, Bilateral upper extremity supported Sitting balance-Leahy Scale: Poor Sitting balance - Comments: Needs UE support and therapist assist to maintain balance secondary to posterior bias. Postural control: Posterior lean Standing balance support: Bilateral upper extremity supported Standing balance-Leahy Scale: Poor Standing balance comment: had pt work on extending through trunk in crouched position on stedy flaps as well as in full standing  Cognition Arousal/Alertness: Awake/alert Behavior During Therapy: Anxious Overall Cognitive Status: No family/caregiver present to determine baseline cognitive functioning                                 General Comments: Pt initially anxious secondary to pain in his neck and legs, not originally agreeable to getting up but  when coaxed secondary to his bed being soiled, but eventually he was persuaded to get to EOB.        Exercises Other Exercises Other Exercises: PROM L hip and knee flex/ext x10 Other Exercises: PROM L knee ext in sitting x10    General Comments General comments (skin integrity, edema, etc.): SPO2 upper 90's on 3L O2 at rest, 93% with exertion, dropped to 85% with transition supine to sit on RA      Pertinent Vitals/Pain Pain Assessment Pain Assessment: 0-10 Pain Score: 10-Worst pain ever Pain Location: headache Pain Descriptors / Indicators: Aching, Headache Pain Intervention(s): Limited activity within patient's tolerance, Monitored during session, Patient requesting pain meds-RN notified, RN gave pain meds during session    Home Living                          Prior Function            PT Goals (current goals can now be found in the care plan section) Acute Rehab PT Goals Patient Stated Goal: return to SNF PT Goal Formulation: With patient Time For Goal Achievement: 06/01/22 Potential to Achieve Goals: Fair Progress towards PT goals: Progressing toward goals    Frequency    Min 2X/week      PT Plan Current plan remains appropriate;Frequency needs to be updated    Co-evaluation PT/OT/SLP Co-Evaluation/Treatment: Yes Reason for Co-Treatment: Complexity of the patient's impairments (multi-system involvement);Necessary to address cognition/behavior during functional activity;For patient/therapist safety PT goals addressed during session: Mobility/safety with mobility;Balance;Proper use of DME;Strengthening/ROM OT goals addressed during session: ADL's and self-care      AM-PAC PT "6 Clicks" Mobility   Outcome Measure  Help needed turning from your back to your side while in a flat bed without using bedrails?: A Lot Help needed moving from lying on your back to sitting on the side of a flat bed without using bedrails?: Total Help needed moving to and  from a bed to a chair (including a wheelchair)?: Total Help needed standing up from a chair using your arms (e.g., wheelchair or bedside chair)?: Total Help needed to walk in hospital room?: Total Help needed climbing 3-5 steps with a railing? : Total 6 Click Score: 7    End of Session Equipment Utilized During Treatment: Oxygen Activity Tolerance: Patient limited by fatigue Patient left: with call bell/phone within reach;in chair Nurse Communication: Mobility status;Need for lift equipment PT Visit Diagnosis: Unsteadiness on feet (R26.81);Muscle weakness (generalized) (M62.81);Difficulty in walking, not elsewhere classified (R26.2)     Time: 3491-7915 PT Time Calculation (min) (ACUTE ONLY): 35 min  Charges:  $Therapeutic Activity: 8-22 mins                     Leighton Roach, PT  Acute Rehab Services Secure chat preferred Office [place] 05/26/2022, 2:00 PM

## 2022-05-26 NOTE — Progress Notes (Signed)
Occupational Therapy Treatment Patient Details Name: Tim Bradley MRN: [id number] DOB: May 29, 1951 Today's Date: 05/26/2022   History of present illness 71 y.o. male presents to [facility] hospital from SNF on 05/17/2022 with SOB. 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  Still needs mod +2 for transitions supine to sit and sit to supine as well as for sit to stand in the [place].  Oxygen sats at 86% on room air sitting EOB with an increase up to 95% with supplemental O2 at 2 Ls nasal cannula. Feel he will continue to benefit from skilled acute care PT to address current selfcare dependence with transition to SNF for follow-up rehab.     Recommendations for follow up therapy are one component of a multi-disciplinary discharge planning process, led by the attending physician.  Recommendations may be updated based on patient status, additional functional criteria and insurance authorization.    Follow Up Recommendations  Skilled nursing-short term rehab (<3 hours/day)    Assistance Recommended at Discharge Frequent or constant Supervision/Assistance  Patient can return home with the following  Two people to help with walking and/or transfers;Two people to help with bathing/dressing/bathroom;Help with stairs or ramp for entrance;Assist for transportation;Direct supervision/assist for medications management;Direct supervision/assist for financial management;Assistance with cooking/housework   Equipment Recommendations  Other (comment) (defer to next venue of care)       Precautions / Restrictions Precautions Precaution Comments: monitor O2 Restrictions Weight Bearing Restrictions: No       Mobility Bed Mobility Overal bed mobility: Needs Assistance Bed Mobility: Supine to Sit, Sit to Supine     Supine to sit: Mod assist, +2 for physical  assistance Sit to supine: +2 for physical assistance, Mod assist   General bed mobility comments: Assist for LEs and trunk with transition to sitting and back to supine    Transfers Overall transfer level: Needs assistance Equipment used: Ambulation equipment used [employer]) Transfers: Sit to/from Stand Sit to Stand: +2 physical assistance, Mod assist (with use of Stedy)             Transfer via Lift Equipment: Stedy   Balance Overall balance assessment: Needs assistance Sitting-balance support: Feet supported, Bilateral upper extremity supported Sitting balance-Leahy Scale: Poor Sitting balance - Comments: Needs UE support and therapist assist to maintain balance secondary to posterior bias.     Standing balance-Leahy Scale: Poor Standing balance comment: Pt needs use of the Stedy as well as BUE support.                           ADL either performed or assessed with clinical judgement   ADL Overall ADL's : Needs assistance/impaired                         Toilet Transfer: +2 for physical assistance;Moderate assistance (use of the [place])   Toileting- Water quality scientist and Hygiene: Moderate assistance;+2 for physical assistance;Sit to/from stand Toileting - Clothing Manipulation Details (indicate cue type and reason): use of Stedy simulated     Functional mobility during ADLs: Moderate assistance;+2 for safety/equipment Charlaine Dalton) General ADL Comments: Pt with increased neck pain, which nursing was made aware of and meds were brought.  Oxygen sats decreasing down to 86% on room air with transition to sitting.  Sats increased to 95%  on 2Ls at rest with sats at 93% post standing in the [place].               Cognition Arousal/Alertness: Awake/alert Behavior During Therapy: Anxious Overall Cognitive Status: No family/caregiver present to determine baseline cognitive functioning                                 General Comments: Pt  initially anxious secondary to pain in his neck and legs, not originally agreeable to getting up but when coaxed secondary to his bed being soiled, but eventually he was persuaded to get to EOB.                   Pertinent Vitals/ Pain       Pain Assessment Pain Assessment: Faces Faces Pain Scale: Hurts little more Pain Location: neck Pain Descriptors / Indicators: Aching, Discomfort Pain Intervention(s): RN gave pain meds during session, Monitored during session         Frequency  Min 2X/week        Progress Toward Goals  OT Goals(current goals can now be found in the care plan section)  Progress towards OT goals: Progressing toward goals  Acute Rehab OT Goals Patient Stated Goal: Wants to go back to SNF as soon as possible OT Goal Formulation: With patient Time For Goal Achievement: 06/01/22 Potential to Achieve Goals: Good  Plan Discharge plan remains appropriate;Frequency remains appropriate    Co-evaluation    PT/OT/SLP Co-Evaluation/Treatment: Yes Reason for Co-Treatment: Necessary to address cognition/behavior during functional activity;For patient/therapist safety   OT goals addressed during session: ADL's and self-care      AM-PAC OT "6 Clicks" Daily Activity     Outcome Measure   Help from another person eating meals?: None Help from another person taking care of personal grooming?: A Little Help from another person toileting, which includes using toliet, bedpan, or urinal?: Total Help from another person bathing (including washing, rinsing, drying)?: Total Help from another person to put on and taking off regular upper body clothing?: A Little Help from another person to put on and taking off regular lower body clothing?: Total 6 Click Score: 13    End of Session Equipment Utilized During Treatment: Oxygen  OT Visit Diagnosis: Unsteadiness on feet (R26.81);Other abnormalities of gait and mobility (R26.89);Muscle weakness (generalized)  (M62.81);Pain Pain - part of body:  (neck)   Activity Tolerance Patient limited by fatigue;Patient limited by pain   Patient Left in bed;with call bell/phone within reach;with bed alarm set   Nurse Communication Mobility status        Time: 6761-9509 OT Time Calculation (min): 30 min  Charges: OT General Charges $OT Visit: 1 Visit OT Treatments $Self Care/Home Management : 8-22 mins  Ceasar Decandia OTR/L 05/26/2022, 12:43 PM

## 2022-05-26 NOTE — Progress Notes (Signed)
[facility] NEPHROLOGY PROGRESS NOTE  Assessment/ Plan: Pt is a 71 y.o. yo male with history of hypertension, DM, CHF, CKD, chronic illness admitted with acute on chronic CHF exacerbation.  #Acute kidney injury on CKD stage IV likely due to decompensated heart failure: Close to euvolemic therefore diuretics was switched to torsemide yesterday.  He had around 1.8 L of urine output however both BUN and creatinine level trending up.  He is DNR and does not want dialysis even if he is going to die.  He is asking if he can go home today.  He wants to follow with his nephrologist at the [place]. Fortunately, no uremic signs or symptoms to warrant urgent dialysis.  He is not ready to accept palliative evaluation either.  #Acute on chronic CHF: Volume managed with IV diuretics initially now on oral torsemide.  Recommend salt and fluid restriction.  # Anemia of CKD: Hemoglobin around 9.  May benefit from ESA/anemia management.    #Sick kidney-MBD: Calcium and phosphorus level acceptable.  Not on binders.  # HTN/volume: Continue cardiac medications and diuretics as discussed above.  Discussed with the primary team.  Subjective: Seen and examined at bedside.  No new event.  Denies chest pain, shortness of breath, nausea or vomiting.  He is wanting to go home and follow-up with his nephrologist.  Confirms with me that he does not want dialysis. Objective Vital signs in last 24 hours: Vitals:   05/25/22 2007 05/26/22 0350 05/26/22 0400 05/26/22 0844  BP: 132/78 126/75  (!) 110/54  Pulse: 90 87  75  Resp: 18 18  18   Temp: 98.5 F (36.9 C) 98.1 F (36.7 C)  98.6 F (37 C)  TempSrc: Oral Oral  Oral  SpO2: 97% 95%  98%  Weight:   121.3 kg   Height:       Weight change: -3 kg  Intake/Output Summary (Last 24 hours) at 05/26/2022 1328 Last data filed at 05/26/2022 1158 Gross per 24 hour  Intake 600 ml  Output 1850 ml  Net -1250 ml       Labs: RENAL PANEL Recent Labs     12/23/21 0436 12/24/21 0530 12/30/21 0733 01/24/22 1621 01/25/22 0039 01/31/22 0454 02/19/22 1555 02/20/22 1235 02/21/22 0353 04/23/22 0900 04/23/22 0910 04/24/22 2109 04/25/22 0542 04/26/22 0047 04/27/22 1610 04/28/22 9604 04/29/22 5409 04/30/22 0254 05/01/22 8119 05/11/22 1256 05/12/22 0606 05/17/22 2050 05/17/22 2251 05/18/22 1125 05/19/22 1478 05/20/22 2956 05/21/22 0451 05/22/22 0443 05/23/22 0457 05/24/22 0613 05/25/22 0341 05/26/22 0630  NA 136 136   < > 133*   < > 139 136 138   < > 140   < > 141   < > 138   < > 140 140 139   < > 136   < > 136 136 136 136 137 136 134* 138 136 137 138  K 4.1 4.1   < > 4.1   < > 4.3 4.2 4.2   < > 5.5*   < > 4.8   < > 4.6   < > 4.3 4.3 4.5   < > 5.0   < > 5.3* 6.2* 4.8 5.0 5.1 4.7 4.6 4.8 4.5 4.3 4.1  CL 99 98   < > 102   < > 108 101 104   < > 113*  --  113*   < > 106   < > 107 107 108   < > 107   < > 104  --  104 105  105 103 101 102 100 98 99  CO2 29 27   < > 22   < > 23 24 26    < > 23  --  20*   < > 24   < > 27 25 24    < > 24   < > 23  --  23 23 20* 23 22 24 25 26 29   GLUCOSE 140* 120*   < > 151*   < > 120* 155* 150*   < > 124*  --  146*   < > 216*   < > 201* 130* 148*   < > 103*   < > 116*  --  184* 117* 210* 127* 216* 123* 166* 162* 156*  BUN 70* 73*   < > 78*   < > 80* 47* 44*   < > 32*  --  45*   < > 55*   < > 64* 67* 66*   < > 48*   < > 68*  --  71* 80* 83* 85* 88* 107* 113* 116* 126*  CREATININE 3.31* 3.17*   < > 3.01*   < > 2.71* 2.91* 2.83*   < > 2.30*  --  2.83*   < > 2.97*   < > 3.38* 3.08* 2.80*   < > 2.60*   < > 3.46*  --  3.33* 3.60* 3.52* 3.50* 3.64* 3.83* 3.63* 3.66* 3.99*  CALCIUM 9.0 9.1   < > 9.2   < > 8.9 9.3 9.7   < > 8.9  --  9.1   < > 8.8*   < > 8.8* 9.1 9.0   < > 9.1   < > 9.1  --  9.2 8.9 8.8* 9.2 8.7* 9.3 9.1 9.0 9.1  MG 2.1 2.3  --   --   --  2.2  --   --   --  1.9  --   --   --  2.0  --  2.1  --   --   --  2.4  --   --   --  2.1  --   --   --   --   --   --   --   --   PHOS  --   --   --   --   --   --   --    --   --   --   --   --   --  4.5  --   --  4.0 4.2  --   --   --   --   --  5.1*  --   --   --   --   --   --   --  5.5*  ALBUMIN  --   --    < > 3.4*  --   --  3.5 3.4*  --  3.2*  --  3.3*  --  3.3*  --   --  3.3* 3.3*  --  3.5  --   --   --   --   --   --   --   --   --   --   --  3.4*   < > = values in this interval not displayed.     Liver Function Tests: Recent Labs  Lab 05/26/22 0630  ALBUMIN 3.4*   No results for input(s): "LIPASE", "AMYLASE" in the last 168 hours. No results for input(s): "AMMONIA" in the last  168 hours. CBC: Recent Labs    01/26/22 0112 01/27/22 0634 05/17/22 2050 05/17/22 2240 05/17/22 2251 05/18/22 0554 05/18/22 1125 05/19/22 0735 05/20/22 0412  HGB 10.2*   < > 10.5*  --  11.2* 10.1*  --  9.6* 9.3*  MCV 89.9   < > 91.2  --   --  89.7  --  88.1 87.0  VITAMINB12 266  --   --   --   --   --  229  --   --   FOLATE 8.7  --   --   --   --   --  9.4  --   --   FERRITIN 118  --   --   --   --   --   --   --   --   TIBC 256  --   --   --   --   --  344  --   --   IRON 8*  --   --   --   --   --  85  --   --   RETICCTPCT 1.4  --   --  1.8  --   --   --   --   --    < > = values in this interval not displayed.    Cardiac Enzymes: No results for input(s): "CKTOTAL", "CKMB", "CKMBINDEX", "TROPONINI" in the last 168 hours. CBG: Recent Labs  Lab 05/25/22 1953 05/26/22 0033 05/26/22 0431 05/26/22 0843 05/26/22 1154  GLUCAP 283* 221* 241* 215* 206*    Iron Studies: No results for input(s): "IRON", "TIBC", "TRANSFERRIN", "FERRITIN" in the last 72 hours. Studies/Results: No results found.  Medications: Infusions:  sodium chloride      Scheduled Medications:  apixaban  5 mg Oral BID   carvedilol  12.5 mg Oral BID   finasteride  5 mg Oral Daily   insulin aspart  0-9 Units Subcutaneous Q4H   isosorbide-hydrALAZINE  1 tablet Oral TID   lacosamide  100 mg Oral BID   levETIRAcetam  750 mg Oral BID   mometasone-formoterol  2 puff Inhalation BID    senna-docusate  1 tablet Oral BID   sodium chloride flush  3 mL Intravenous Q12H   torsemide  100 mg Oral Daily    have reviewed scheduled and prn medications.  Physical Exam: General:NAD, comfortable Heart:RRR, s1s2 nl Lungs:clear b/l, no crackle Abdomen:soft, Non-tender, non-distended Extremities: LE edema+ Neurology: Alert awake, no asterixis  Tim Bradley 05/26/2022,1:28 PM  LOS: 9 days

## 2022-05-26 NOTE — Progress Notes (Signed)
Heart Failure Stewardship Pharmacist Progress Note   PCP: [facility] PCP-Cardiologist: Peter Nishan, MD    HPI:  71 yo M with PMH of CHF, NSTEMI, HTN, DVT, seizures, T2DM, OSA, wheelchair bound due to severe spinal stenosis, and prostate cancer.    He was initially diagnosed with HF in 03/2020 when he presented to the hospital complaining of leg edema and weakness. ECHO at that time with EF <20% and RV mildly reduced. He was diuresed and left AMA on 04/13/20. He was discharged with lasix, carvedilol, and BiDil in the setting of AKI.    He presented back to the hospital on 04/21/20 with acute decompensated HF. He stated at this encounter that he had not picked up the medications due to financial reasons. He was diuresed and discharged on the same medications.    He was back at the hospital in 04/2020 with acute CHF and suspected seizure. He underwent stress test at that time. Significant perfusion defect of the entire anterior and inferior wall that is slightly worse at stress. However, does not appear to be severely abnormal, and distribution does not explain global hypokinesis. Likely infarct with small amount of worsening ischemia with stress, but cardiomyopathy out of proportion to ischemia/infarction. Diuresed and discharged with carvedilol, BiDil, and lasix PRN.   Hospitalized in 11/2021 with acute CHF. He ran out of home diuretics. Diuresed and discharged with torsemide, carvedilol, and restarted BiDil. ECHO from OSH in Feb 2023 with EF 40-45%.   Hospitalized from 7/27-8/3 with acute CHF. Diuresed and discharged to SNF with lasix 60 mg daily, carvedilol, and BiDil.  Hospitalized from 8/13-8/17 with acute CHF. Reported that urine output was decreasing and O2 requirements increasing at SNF. Question if he was getting lasix PTA. Diuresed and discharged back to SNF.   Presented to the ED on 8/19 with shortness of breath and edema. CXR with cardiomegaly and vascular congestion. Renal  consulted.  Current HF Medications: Diuretic: torsemide 100 mg daily Beta Blocker: carvedilol 12.5 mg BID Other: BiDil 1 tab TID  Prior to admission HF Medications (per SNF): Diuretic: furosemide 40 mg daily Beta blocker: carvedilol 12.5 mg BID ** not taking BiDil  Pertinent Lab Values: Serum creatinine 3.99, BUN 126, Potassium 4.1, Sodium 138, BNP 132.8, Magnesium 2.1, A1c 6.9   Vital Signs: Weight: 267 lbs (admission weight: 279 lbs) Blood pressure: 110-120/80s Heart rate: 70-90s I/O: -2.1L yesterday; net -14L  Medication Assistance / Insurance Benefits Check: Does the patient have prescription insurance?  Yes Type of insurance plan: Medicare + [facility]  Outpatient Pharmacy:  Prior to admission outpatient pharmacy: SNF  Assessment: 1. Acute on chronic systolic CHF (LVEF 35-40%), due to NICM. NYHA class III symptoms. - Continue torsemide 100 mg daily. Recording weights as bed weights - question accuracy. Strict I/Os. Keep K>4 and Mag>2. Ongoing conversations with renal about HD.  - Continue carvedilol 12.5 mg BID - No ACE/ARB/ARNI, MRA, or SGLT2i with AKI on advanced CKD (eGFR <20) - Continue BiDil 20/37.5 mg 1 tab TID   Plan: 1) Medication changes recommended at this time: - Continue current regimen - diuresis per renal  2) Patient assistance: - None needed - SNF at discharge  Megan Boothby, PharmD, BCPS Heart Failure Stewardship Pharmacist Phone [phone]   

## 2022-06-03 ENCOUNTER — Telehealth (HOSPITAL_COMMUNITY): Payer: Self-pay | Admitting: *Deleted

## 2022-06-03 ENCOUNTER — Encounter (HOSPITAL_COMMUNITY): Payer: No Typology Code available for payment source

## 2022-06-03 NOTE — Telephone Encounter (Signed)
Heart Failure Nurse Navigator Progress Note   Appointment 06/03/22 cancelled per sister, patient has a [place] cardiologist appointment today and wants to go there . Offered to reschedule, stated he would rather go there.   [name], BSN, Clinical cytogeneticist Only

## 2022-07-01 ENCOUNTER — Encounter (HOSPITAL_COMMUNITY): Payer: Self-pay | Admitting: Emergency Medicine

## 2022-07-01 ENCOUNTER — Other Ambulatory Visit: Payer: Self-pay

## 2022-07-01 ENCOUNTER — Emergency Department (HOSPITAL_COMMUNITY): Payer: No Typology Code available for payment source

## 2022-07-01 ENCOUNTER — Emergency Department (HOSPITAL_COMMUNITY)
Admission: EM | Admit: 2022-07-01 | Discharge: 2022-07-01 | Disposition: A | Discharge status: Skilled Nursing Facility | Payer: No Typology Code available for payment source | Attending: Emergency Medicine | Admitting: Emergency Medicine

## 2022-07-01 DIAGNOSIS — I509 Heart failure, unspecified: Secondary | ICD-10-CM | POA: Insufficient documentation

## 2022-07-01 DIAGNOSIS — I130 Hypertensive heart and chronic kidney disease with heart failure and stage 1 through stage 4 chronic kidney disease, or unspecified chronic kidney disease: Secondary | ICD-10-CM | POA: Diagnosis not present

## 2022-07-01 DIAGNOSIS — E1122 Type 2 diabetes mellitus with diabetic chronic kidney disease: Secondary | ICD-10-CM | POA: Diagnosis not present

## 2022-07-01 DIAGNOSIS — K5900 Constipation, unspecified: Secondary | ICD-10-CM | POA: Insufficient documentation

## 2022-07-01 DIAGNOSIS — Z1152 Encounter for screening for COVID-19: Secondary | ICD-10-CM | POA: Diagnosis not present

## 2022-07-01 DIAGNOSIS — Z79899 Other long term (current) drug therapy: Secondary | ICD-10-CM | POA: Diagnosis not present

## 2022-07-01 DIAGNOSIS — Z7901 Long term (current) use of anticoagulants: Secondary | ICD-10-CM | POA: Diagnosis not present

## 2022-07-01 DIAGNOSIS — N184 Chronic kidney disease, stage 4 (severe): Secondary | ICD-10-CM | POA: Diagnosis not present

## 2022-07-01 LAB — CBC WITH DIFFERENTIAL/PLATELET
Abs Immature Granulocytes: 0.02 K/uL (ref 0.00–0.07)
Basophils Absolute: 0.1 K/uL (ref 0.0–0.1)
Basophils Relative: 1 %
Eosinophils Absolute: 0.1 K/uL (ref 0.0–0.5)
Eosinophils Relative: 2 %
HCT: 32 % — ABNORMAL LOW (ref 39.0–52.0)
Hemoglobin: 10.1 g/dL — ABNORMAL LOW (ref 13.0–17.0)
Immature Granulocytes: 0 %
Lymphocytes Relative: 14 %
Lymphs Abs: 1 K/uL (ref 0.7–4.0)
MCH: 28.3 pg (ref 26.0–34.0)
MCHC: 31.6 g/dL (ref 30.0–36.0)
MCV: 89.6 fL (ref 80.0–100.0)
Monocytes Absolute: 0.7 K/uL (ref 0.1–1.0)
Monocytes Relative: 10 %
Neutro Abs: 5.1 K/uL (ref 1.7–7.7)
Neutrophils Relative %: 73 %
Platelets: 248 K/uL (ref 150–400)
RBC: 3.57 MIL/uL — ABNORMAL LOW (ref 4.22–5.81)
RDW: 13.7 % (ref 11.5–15.5)
WBC: 6.9 K/uL (ref 4.0–10.5)
nRBC: 0 % (ref 0.0–0.2)

## 2022-07-01 LAB — COMPREHENSIVE METABOLIC PANEL
ALT: 16 U/L (ref 0–44)
AST: 16 U/L (ref 15–41)
Albumin: 3.5 g/dL (ref 3.5–5.0)
Alkaline Phosphatase: 53 U/L (ref 38–126)
Anion gap: 12 (ref 5–15)
BUN: 76 mg/dL — ABNORMAL HIGH (ref 8–23)
CO2: 27 mmol/L (ref 22–32)
Calcium: 9.3 mg/dL (ref 8.9–10.3)
Chloride: 98 mmol/L (ref 98–111)
Creatinine, Ser: 3.44 mg/dL — ABNORMAL HIGH (ref 0.61–1.24)
GFR, Estimated: 18 mL/min — ABNORMAL LOW (ref >60)
Glucose, Bld: 166 mg/dL — ABNORMAL HIGH (ref 70–99)
Potassium: 4.6 mmol/L (ref 3.5–5.1)
Sodium: 137 mmol/L (ref 135–145)
Total Bilirubin: 0.5 mg/dL (ref 0.3–1.2)
Total Protein: 6.8 g/dL (ref 6.5–8.1)

## 2022-07-01 LAB — SARS CORONAVIRUS 2 BY RT PCR: SARS Coronavirus 2 by RT PCR: NEGATIVE

## 2022-07-01 LAB — LIPASE, BLOOD: Lipase: 29 U/L (ref 11–51)

## 2022-07-01 NOTE — ED Triage Notes (Signed)
Pt presents from [facility] for low O2 sats (80s on 2Lbaseline requirement per staff, increased to 3L prior to EMS arrival) and high BP (192/100). EMS notes BP 178/100, P90, 95% on 3L.  En route, pt shares with EMS that his primary concern is constipation x1 mo. Had a small BM yesterday, takes miralax.   During ttriage exam, able to titrate pt down to baseline 2L, sats 96% on 2L.    Taks a blood thinner, h/o CHF, DM , afib

## 2022-07-01 NOTE — ED Notes (Signed)
Ptar called 

## 2022-07-01 NOTE — Discharge Instructions (Signed)
You were seen today with concerns for low oxygen and constipation.  Your oxygen levels here are at your baseline on 2 L of oxygen.  Your work-up is negative.  Your COVID testing and chest x-ray were negative.  You have no significant stool burden on your exam.  Follow-up with the doctor at your facility regarding stool softeners and/or laxatives that may be safe with your current medication regimen.

## 2022-07-01 NOTE — ED Provider Notes (Signed)
[facility] EMERGENCY DEPARTMENT Provider Note   CSN: [id number] Arrival date & time: 07/01/22  0128     History  Chief Complaint  Patient presents with   Constipation    Tim Bradley is a 71 y.o. male.  HPI     This is a 71 year old male with a history of CHF, diabetes, atrial fibrillation on Eliquis who presents with concerns from his staff of low O2 sats.  He is from [employer].  Reportedly on 2 L at baseline.  Was increased to 3 L because of O2 sats in the 80s.  Also noted to be hypertensive.  Patient is not complaining of shortness of breath at this time although he states that he sometimes does have shortness of breath.  He is mostly complaining of constipation.  He states that he has had issues over the last month with having regular bowel movements.  His last bowel movement was 2 days ago and was hard and small.  He denies abdominal pain or vomiting.  Reportedly has not had any fevers.  Denies chest pain.   Recent admission for acute on chronic respiratory failure.  At that time chart indicated DNR/DNI.  He is listed to have a legal guardian but I can only see a power of attorney in the chart. Home Medications Prior to Admission medications   Medication Sig Start Date End Date Taking? Authorizing Provider  acetaminophen (TYLENOL) 500 MG tablet Take 1,000 mg by mouth 3 (three) times daily as needed for mild pain.    [provider]  apixaban (ELIQUIS) 5 MG TABS tablet Take 1 tablet (5 mg total) by mouth 2 (two) times daily. 01/10/22   Travin Marik, Alvin Critchley, DO  atorvastatin (LIPITOR) 80 MG tablet Take 1 tablet (80 mg total) by mouth daily. Patient taking differently: Take 80 mg by mouth every evening. 01/10/22   Bluford Sedler, Alvin Critchley, DO  carvedilol (COREG) 12.5 MG tablet Take 1 tablet (12.5 mg total) by mouth 2 (two) times daily. 04/30/22 05/30/22  Arrien, Jimmy Picket, MD  Cholecalciferol (QC VITAMIN D3) 50 MCG (2000 UT) TABS Take 2,000 Units by mouth  daily.    [provider]  finasteride (PROSCAR) 5 MG tablet Take 1 tablet (5 mg total) by mouth daily. 05/16/22   Domenic Polite, MD  guaiFENesin (ROBITUSSIN) 100 MG/5ML liquid Take 200 mg by mouth every 4 (four) hours as needed for cough or to loosen phlegm. Give 72mL (equal to 200mg ) by mouth every four hours as needed for cough x 72 hours. **up to 2400mg  24 hours** 05/11/22   [provider]  HYDROcodone-acetaminophen (NORCO/VICODIN) 5-325 MG tablet Take 1 tablet by mouth 3 (three) times daily as needed for moderate pain. 05/26/22   Arrien, Jimmy Picket, MD  ipratropium-albuterol (DUONEB) 0.5-2.5 (3) MG/3ML SOLN Take 3 mLs by nebulization every 4 (four) hours as needed (shortness of breath/wheezing).    [provider]  Lacosamide 100 MG TABS Take 100 mg by mouth 2 (two) times daily.    [provider]  levETIRAcetam (KEPPRA) 750 MG tablet Take 1 tablet (750 mg total) by mouth 2 (two) times daily. 02/21/22   Ghimire, Henreitta Leber, MD  Magnesium Oxide 420 MG TABS Take 420 mg by mouth daily.    [provider]  melatonin 3 MG TABS tablet Take 1 tablet (3 mg total) by mouth at bedtime. 05/15/22   Domenic Polite, MD  mometasone-formoterol [facility]) 200-5 MCG/ACT AERO Inhale 2 puffs into the lungs 2 (two)  times daily. 01/31/22 07/05/22  Darliss Cheney, MD  naloxone [facility]) nasal spray 4 mg/0.1 mL Place 1 spray into the nose as needed (opioid overdose).    [provider]  polyethylene glycol (MIRALAX / GLYCOLAX) 17 g packet Take 17 g by mouth daily as needed for mild constipation. 05/26/22   Arrien, Jimmy Picket, MD  torsemide (DEMADEX) 100 MG tablet Take 1 tablet (100 mg total) by mouth daily. 05/27/22 06/26/22  Arrien, Jimmy Picket, MD      Allergies    Gabapentin, Tramadol, and Trazodone and nefazodone    Review of Systems   Review of Systems  Constitutional:  Negative for fever.  Respiratory:  Negative for cough and shortness of breath.    Gastrointestinal:  Positive for constipation. Negative for abdominal pain.  All other systems reviewed and are negative.   Physical Exam Updated Vital Signs BP (!) 159/74 (BP Location: Right Arm)   Pulse 71   Temp 98.4 F (36.9 C) (Oral)   Resp (!) 22   Wt 121 kg   SpO2 97%   BMI 34.25 kg/m  Physical Exam Vitals and nursing note reviewed.  Constitutional:      Appearance: He is well-developed.     Comments: Elderly, chronically ill-appearing but nontoxic  HENT:     Head: Normocephalic and atraumatic.  Eyes:     Pupils: Pupils are equal, round, and reactive to light.  Cardiovascular:     Rate and Rhythm: Normal rate and regular rhythm.     Heart sounds: Normal heart sounds. No murmur heard. Pulmonary:     Effort: Pulmonary effort is normal. No respiratory distress.     Breath sounds: Normal breath sounds. No wheezing.     Comments: O2 by nasal cannula in place, diminished breath sounds in all lung fields, no respiratory distress noted Abdominal:     General: Bowel sounds are normal.     Palpations: Abdomen is soft.     Tenderness: There is no abdominal tenderness. There is no rebound.  Musculoskeletal:     Cervical back: Neck supple.     Right lower leg: Edema present.     Left lower leg: Edema present.  Lymphadenopathy:     Cervical: No cervical adenopathy.  Skin:    General: Skin is warm and dry.  Neurological:     Mental Status: He is alert and oriented to person, place, and time.  Psychiatric:        Mood and Affect: Mood normal.     ED Results / Procedures / Treatments   Labs (all labs ordered are listed, but only abnormal results are displayed) Labs Reviewed  CBC WITH DIFFERENTIAL/PLATELET - Abnormal; Notable for the following components:      Result Value   RBC 3.57 (*)    Hemoglobin 10.1 (*)    HCT 32.0 (*)    All other components within normal limits  COMPREHENSIVE METABOLIC PANEL - Abnormal; Notable for the following components:   Glucose, Bld  166 (*)    BUN 76 (*)    Creatinine, Ser 3.44 (*)    GFR, Estimated 18 (*)    All other components within normal limits  SARS CORONAVIRUS 2 BY RT PCR  LIPASE, BLOOD    EKG EKG Interpretation  Date/Time:  Tuesday July 01 2022 02:39:15 EDT Ventricular Rate:  72 PR Interval:  178 QRS Duration: 162 QT Interval:  466 QTC Calculation: 510 R Axis:   -29 Text Interpretation: Normal sinus rhythm Left bundle branch  block Abnormal ECG When compared with ECG of 17-May-2022 20:58, PREVIOUS ECG IS PRESENT No significant change since last tracing Confirmed by Thayer Jew [phone]) on 07/01/2022 4:34:20 AM  Radiology DG Abdomen Acute W/Chest  Result Date: 07/01/2022 CLINICAL DATA:  Shortness of breath EXAM: DG ABDOMEN ACUTE WITH 1 VIEW CHEST COMPARISON:  None Available. FINDINGS: Cardiac shadow is enlarged. Aortic calcifications are noted. The lungs are well aerated bilaterally. No focal infiltrate is seen. No bony abnormality is noted. Scattered large and small bowel gas is noted. No free air is noted. No obstructive changes are seen. No acute bony abnormality is noted. IMPRESSION: No acute abnormality in the abdomen or pelvis. Electronically Signed   By: Inez Catalina M.D.   On: 07/01/2022 02:34    Procedures Procedures    Medications Ordered in ED Medications - No data to display  ED Course/ Medical Decision Making/ A&P                           Medical Decision Making Amount and/or Complexity of Data Reviewed Labs: ordered. Radiology: ordered.   This patient presents to the ED for concern of hypoxia, constipation, this involves an extensive number of treatment options, and is a complaint that carries with it a high risk of complications and morbidity.  I considered the following differential and admission for this acute, potentially life threatening condition.  The differential diagnosis includes pneumonia, pneumothorax, viral illness such as COVID-19, constipation, SBO  MDM:     This is a 71 year old chronically ill gentleman who presents with concerns of constipation.  His facility was concerned that he was requiring more oxygen.  He is not having any significant shortness of breath.  He is without complaint.  He was quickly weaned to his baseline O2 of 2 L.  Otherwise his vital signs are reassuring and he is in no respiratory distress.  His abdomen is soft and nontender.  Basic lab work obtained and reviewed.  This is largely reassuring and shows that patient is at his baseline.  He has, renal dysfunction.  Chest x-ray shows no evidence of pneumothorax or pneumonia and abdominal x-ray without significant stool burden.  COVID testing negative.  Overall reassuring.  I did discuss the results with his sister who is listed as his power of attorney.  She is agreeable to discharge back to his facility.  (Labs, imaging, consults)  Labs: I Ordered, and personally interpreted labs.  The pertinent results include: CBC, CMP, lipase, COVID  Imaging Studies ordered: I ordered imaging studies including cute abdominal series I independently visualized and interpreted imaging. I agree with the radiologist interpretation  Additional history obtained from sister and EMS.  External records from outside source obtained and reviewed including recent discharge summary  Cardiac Monitoring: The patient was maintained on a cardiac monitor.  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   I have reviewed the patients home medicines and have made adjustments as needed  Problem List / ED Course: Problem List Items Addressed This Visit   None Visit Diagnoses     Constipation, unspecified constipation type    -  Primary                   Final Clinical Impression(s) / ED Diagnoses Final diagnoses:  Constipation, unspecified constipation type    Rx / DC Orders ED Discharge Orders     None         Merryl Hacker, MD 07/01/22 [phone]

## 2022-07-03 ENCOUNTER — Other Ambulatory Visit (HOSPITAL_BASED_OUTPATIENT_CLINIC_OR_DEPARTMENT_OTHER): Payer: Self-pay

## 2023-07-21 IMAGING — DX DG CHEST 1V PORT
1 series · 1 of 1 positions shown · non-contrast
Comparison: Previous studies including the examination of
12/30/2021

CLINICAL DATA: Shortness of breath, cough

EXAM:
PORTABLE CHEST 1 VIEW

[chest ap]
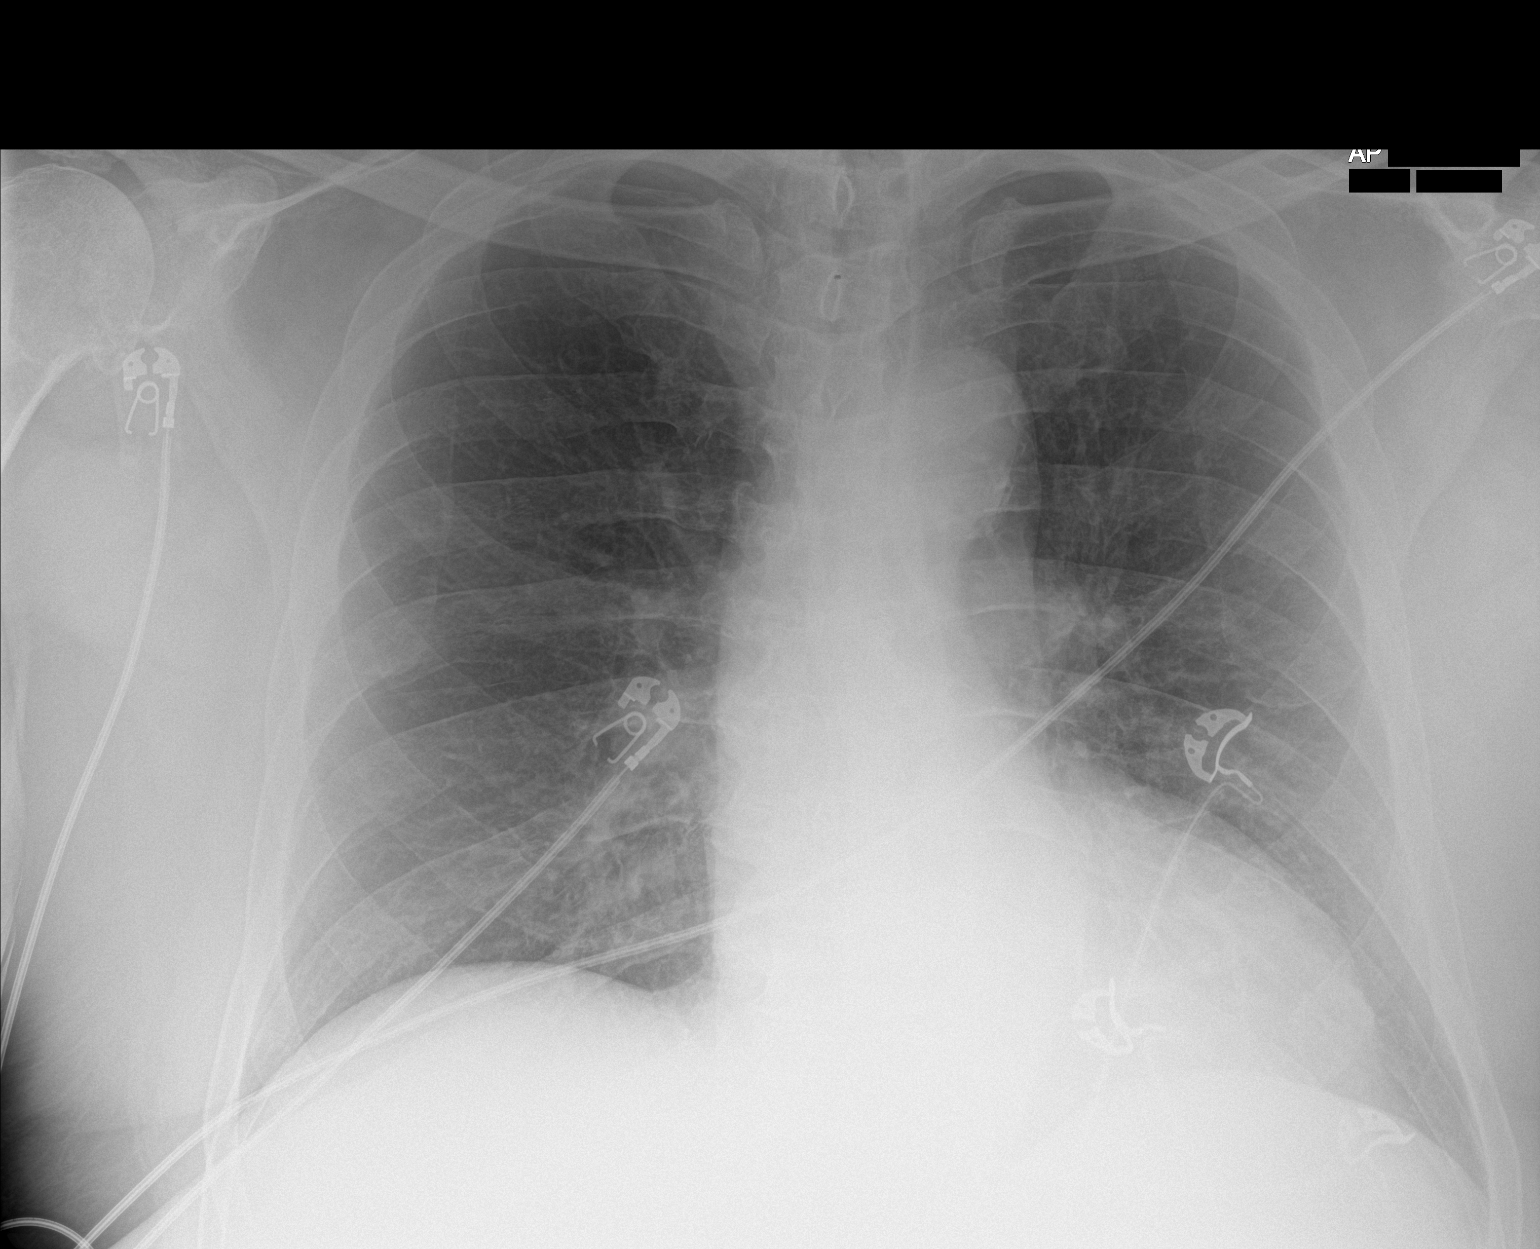

[1 of 1 positions shown; findings below may reference images not displayed]

FINDINGS: Transverse diameter of heart is increased. Thoracic aorta is
tortuous. There are no new infiltrates or signs of pulmonary edema.
There is no pleural effusion or pneumothorax. Degenerative changes
are noted in the right shoulder.
IMPRESSION: No active disease.

## 2023-07-28 IMAGING — CR DG CHEST 2V
2 series · 2 of 2 positions shown · non-contrast
Comparison: January 10, 2022.

CLINICAL DATA: sob, no cough

EXAM:
CHEST - 2 VIEW

[chest lat]
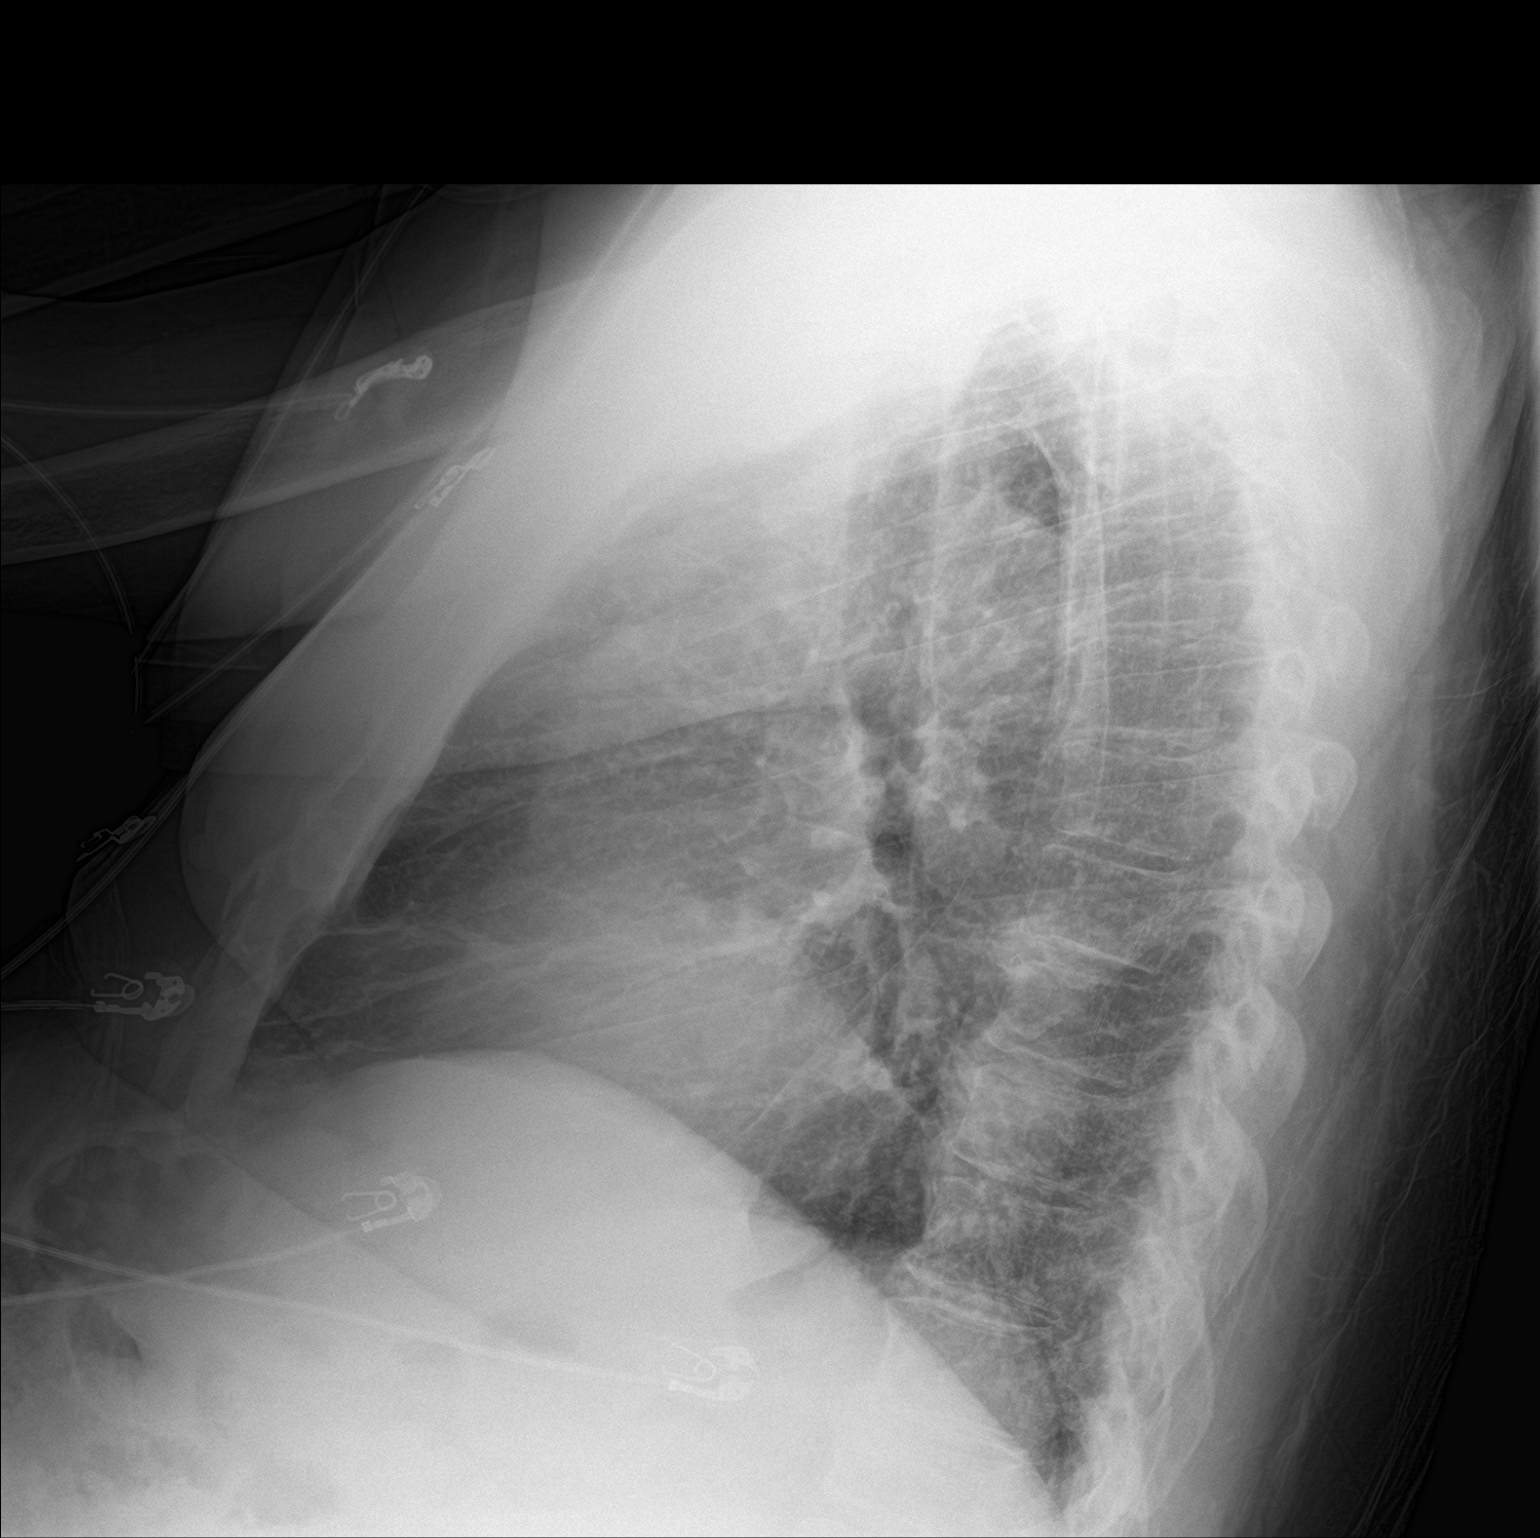

[chest ap]
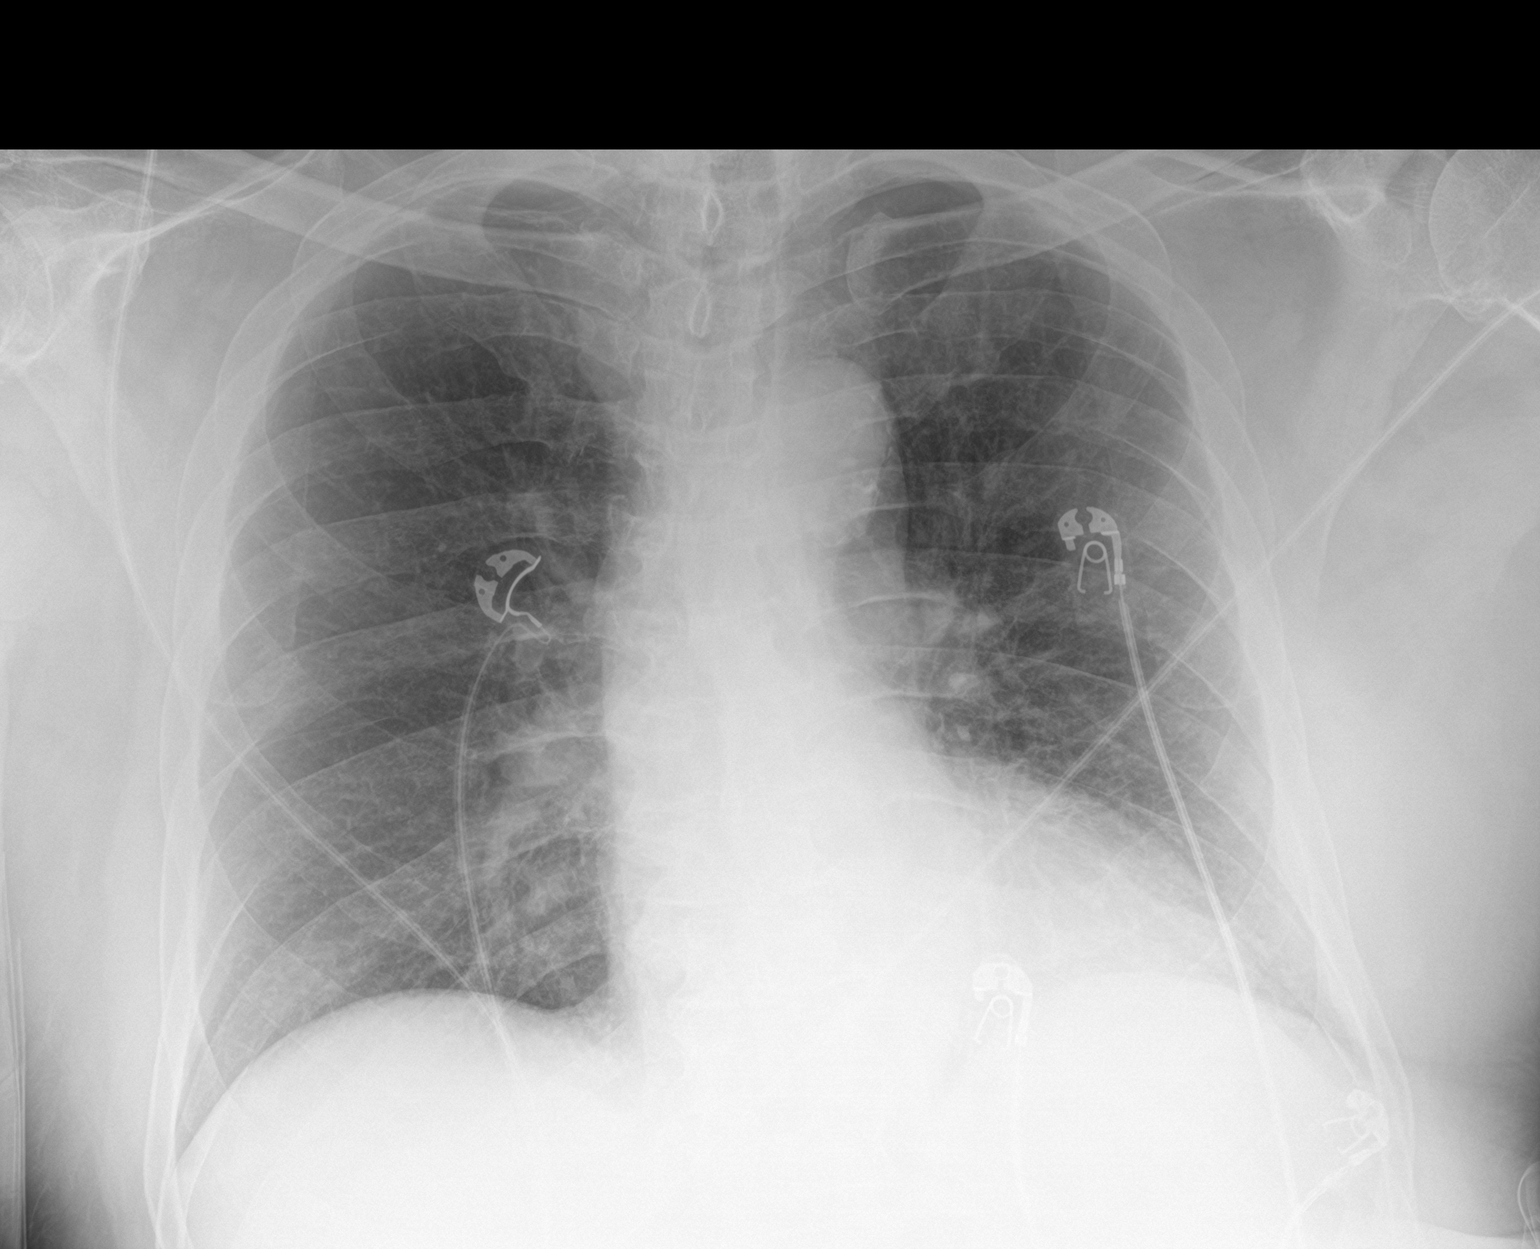

[2 of 2 positions shown; findings below may reference images not displayed]

FINDINGS: Similar cardiomediastinal silhouette. No consolidation. No visible
pleural effusions or pneumothorax. Cardiomediastinal silhouette is
within normal limits. No acute osseous abnormality.
IMPRESSION: No evidence of acute cardiopulmonary disease.

## 2023-11-03 NOTE — Progress Notes (Signed)
 Chief complaint: This patient is seen for evaluation of  prostate cancer   Patient ID: Tim Bradley is a 73 y.o. male    Thank you for allowing us  to see Tim Bradley.  I have personally reviewed the medical record, including office notes, laboratory results, imaging studies, and I have personally interviewed and examined the patient with the pertinent findings detailed below for our records.  History of Present Illness: Tim Bradley is a 72 y.o. male  with a PMH that includes prostate cancer, CKD, seizure, history of DVT, hypertension, congestive heart failure, diabetes type 2  - GU / Urogyn History:  1.  Prostate cancer per biopsy December 2022: GG 4.  At previous clinical visits:  18 November 2022 73 y.o. 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 He is a [employer] veteran with a history of PTSD, HTN, substance abuse, alcohol  dependence, T2DM, ED, GERD, spinal stenosis, LUTS, right lower extremity DVT on Eliquis , and  nodular prostate on exam referred for prostate cancer. His PSA 02/2021 was 23.3 and 06/2016 was 15.3. Most recent PSA was 8.94.    He wears a condom catheter for convenience. He has no major complaints at this time. He is here for general checkup.  04 November 2023 Today in clinic patient presents for follow-up.  He had concerns of UTI with dysuria and was treated with antibiotic at facility.  He does not recall a culture being completed.  He says his symptoms have mostly resolved but he still has some mild irritation left.  He continues to follow with oncology for his prostate cancer.  He continues to wear condom catheter.  Patient denies dysuria, gross hematuria, fever, flank pain. They have not had any abdominal pain, nausea, vomiting, diarrhea/constipation, night sweats, or unintentional weight loss.  Fall Screening: Do you usually have a device to assist in your mobility? yes  Oncology History   No  history exists.    Medications:  Current Outpatient Medications  Medication Sig Dispense Refill  . acetaminophen  (TYLENOL ) 500 mg tablet Take 1,000 mg by mouth.    SABRA artificial tears (Artificial Tears,pg-hypm-glyc,) 1-0.2-0.2 % drop eye drops Administer 1 drop into each eyes 2 (two) times a day.    SABRA artificial tears (HYPOTEARS) 1.4-0.6 % dpet eye drops Administer into affected eye(s).    . aspirin  81 mg chewable tablet Chew 81 mg Once Daily.    . atorvastatin  (LIPITOR ) 80 mg tablet Take 80 mg by mouth at bedtime.    . atorvastatin  (LIPITOR ) 80 mg tablet 40 mg.    . calcitrioL  (ROCALTROL ) 0.25 mcg capsule Take 0.25 mcg by mouth daily.    . calcium  acetate (PHOSLO ) 667 mg (169 mg calcium ) capsule Take 667 mg by mouth.    . carvediloL  (COREG ) 12.5 mg tablet Take 12.5 mg by mouth in the morning and 12.5 mg in the evening. Take with meals.    . cholecalciferol  (VITAMIN D3) 2,000 unit tablet Take 1 tablet by mouth daily.    . cranberry 450 mg tab tablet Take 450 mg by mouth Once Daily.    . cyclobenzaprine (FLEXERIL) 5 mg tablet Take 1 tablet by mouth 3 (three) times a day as needed.    . diclofenac  sodium 1 % kit Apply topically.    . DULoxetine  (CYMBALTA ) 60 mg capsule Take 60 mg by mouth Once Daily.    . Eliquis  5 mg tab Take 5 mg by mouth 2 (two)  times a day.    . famotidine  (PEPCID ) 20 mg tablet Take 20 mg by mouth 2 (two) times a day.    . ferrous sulfate 325 mg (65 mg iron) tablet Take 325 mg by mouth daily before breakfast.    . finasteride  (PROSCAR ) 5 mg tablet Take 5 mg by mouth Once Daily.    . guaiFENesin  (ROBITUSSIN) 100 mg/5 mL syrup Take 200 mg by mouth.    . hydrALAZINE  (APRESOLINE ) 25 mg tablet Take 25 mg by mouth.    . HYDROcodone -acetaminophen  (NORCO) 5-325 mg per tablet Take 1 tablet by mouth.    . insulin  lispro (HumaLOG) 100 unit/mL injection Inject under the skin.    SABRA ipratropium-albuteroL  (DUO-NEB) 0.5-2.5 mg/3 mL nebulizer solution Inhale 3 mL.    .  isosorbide -hydrALAZINE  (BIDIL ) 20-37.5 mg per tablet Take 1 tablet by mouth 3 (three) times a day.    . ketoconazole (NIZORAL) 2 % shampoo SHAMPOO AS DIRECTED TOPICALLY SUNDAY,TUESDAY,THURSDAY AS NEEDED    . lacosamide  (VIMPAT ) 100 mg tablet Take 200 mg by mouth.    . levETIRAcetam  (KEPPRA ) 500 mg tablet Take 500 mg by mouth 2 (two) times a day.    . levETIRAcetam  (KEPPRA ) 750 mg tablet Take 750 mg by mouth 2 (two) times a day.    . magnesium  oxide 400 mg magnesium  cap Take 400 mg by mouth.    . magnesium  oxide 420 mg tab 420 mg.    . melatonin 3 mg cap Take by mouth.    . melatonin tablet Take 5 mg by mouth at bedtime.    . mometasone -formoterol  (DULERA ) 200-5 mcg/actuation HFAA inhaler Inhale.    . naloxone (NARCAN) 4 mg/actuation spry nasal spray SPRAY 1 SPRAY INTO ONE NOSTRIL AS DIRECTED AS NEEDED FOR RESCUE FOR OPIOID OVERDOSE - CALL 911 IMMEDIATELY, ADMINISTER DOSE, THEN TURN PERSON ON SIDE - IF NO RESPONSE IN 2-3 MINUTES OR PERSON RESPONDS BUT RELAPSES, REPEAT USING A NEW SPRAY DEVICE AND SPRAY INTO THE OTHER NOSTRIL    . omeprazole (PriLOSEC) 20 mg DR capsule Take 20 mg by mouth in the morning.    . oxyCODONE  (ROXICODONE ) 10 mg tab Take 1 tablet by mouth 2 (two) times a day as needed.    . oxyCODONE  (ROXICODONE ) 10 mg tab Take 10 mg by mouth every 4 (four) hours as needed for moderate pain (4-6).    SABRA polyethylene glycol (GLYCOLAX ) 17 gram packet Take by mouth.    . pyrithione zinc  1 % sham SHAMPOO SMALL AMOUNT AFFECTED AREA TWICE WEEKLY FOR DANDRUFF    . senna (SENOKOT) 8.6 mg tablet Take 1 tablet by mouth daily.    . sildenafiL (VIAGRA) 100 mg tablet 50 mg.    . tamsulosin  (FLOMAX ) 0.4 mg cap Take 1 capsule by mouth daily.    . torsemide  (DEMADEX ) 100 mg tablet Take 100 mg by mouth daily.    . carvediloL  (COREG ) 6.25 mg tablet Take 1 tablet (6.25 mg total) by mouth in the morning and 1 tablet (6.25 mg total) in the evening. Take with meals. Do all this for 14 days. 28 tablet 0  .  torsemide  (DEMADEX ) 100 mg tablet Take 0.5 tablets (50 mg total) by mouth Once Daily for 14 days. 7 tablet 0  . valproic  acid (DEPAKENE ) 250 mg capsule Take 2 capsules (500 mg total) by mouth 2 (two) times a day for 14 days. 56 capsule 0   No current facility-administered medications for this visit.    Allergies:  Allergies  Allergen  Reactions  . Gabapentin  Other (See Comments)    unknown  . Tramadol  Other (See Comments)    unknown  . Trazodone  Other (See Comments)    Family reports seizures from this medication    Past Medical History:  Diagnosis Date  . Hypertension   . Prostate cancer (CMD)     Past Surgical History:  Procedure Laterality Date  . HIP SURGERY    . NECK SURGERY    . SPINE SURGERY      Social History   Socioeconomic History  . Marital status: Single    Spouse name: Not on file  . Number of children: Not on file  . Years of education: Not on file  . Highest education level: Not on file  Occupational History  . Not on file  Tobacco Use  . Smoking status: Some Days    Types: Cigarettes  . Smokeless tobacco: Former    Types: Chew  . Tobacco comments:    Sometimes but has been years  Vaping Use  . Vaping status: Never Used  Substance and Sexual Activity  . Alcohol  use: Yes  . Drug use: Never  . Sexual activity: Not Currently  Other Topics Concern  . Not on file  Social History Narrative  . Not on file   Social Drivers of Health   Food Insecurity: No Food Insecurity (05/12/2022)   Received from [facility]   Food vital sign   . Within the past 12 months, you worried that your food would run out before you got money to buy more: Never true   . Within the past 12 months, the food you bought just didn't last and you didn't have money to get more: Never true  Transportation Needs: No Transportation Needs (05/12/2022)   Received from [facility] [place]   [facility] - Transportation   . Lack of Transportation (Medical): No   . Lack of  Transportation (Non-Medical): No  Safety: Unknown (12/31/2021)   Received from [facility]   HITS   . Physically Hurt: Not on file   . Insult or Talk Down To: Not on file   . Threaten Physical Harm: Not on file   . Scream or Curse: Not on file  Living Situation: Not on file     SUBJECTIVE  Review of Systems   A comprehensive ROS was performed with pertinent positives/negatives noted in the HPI. The remainder of the ROS are negative.  No family history on file.  OBJECTIVE Vitals:   11/04/23 1045  BP: 140/82  Pulse: 77  Resp: 20  Temp: 97 F (36.1 C)  SpO2: 98%    Body mass index is 33.38 kg/m.  Physical Examination  Physical Exam Constitutional:      Appearance: Normal appearance.  HENT:     Head: Normocephalic and atraumatic.  Pulmonary:     Effort: Pulmonary effort is normal.     Breath sounds: Normal breath sounds.  Skin:    General: Skin is warm and dry.  Neurological:     General: No focal deficit present.     Mental Status: He is alert and oriented to person, place, and time.  Psychiatric:        Mood and Affect: Mood normal.        Behavior: Behavior normal.        Thought Content: Thought content normal.        Judgment: Judgment normal.      Urine Culture No components found  for: URINEUROCID  Urinalysis   PVR: 177 mL  Data Reviewed  CT STONE STUDY, 10/16/2022 4:44 PM     INDICATION: Nephrolithiasis \ C61 PC (prostate cancer) (HCC)    COMPARISON: Outside MR lumbar spine (images only 11/27/2017).    TECHNIQUE: Axial images of the abdomen and pelvis were obtained without intravenous contrast. Supplemental 2D reformatted images were generated and reviewed as needed.   All CT scans at [facility] and [facility] are performed using radiation dose optimization techniques as appropriate to a performed exam, including but not limited to one or more of the following: automatic exposure  control, adjustment of the mA and/or kV according to patient size, use of iterative reconstruction technique. In addition, our institution participates in a radiation dose monitoring program to optimize patient radiation exposure.   LIMITATIONS: Lack of intravenous contrast decreases sensitivity for the detection of solid organ lesions.   FINDINGS:   LOWER CHEST Mediastinum/hila: Within normal limits. Heart/vessels: Coronary artery calcific dictation. Lungs/pleura: Bibasilar atelectasis/scarring. No focal airspace consolidation or pleural effusion.   ABDOMEN Liver: Within normal limits. Gallbladder/biliary: Within normal limits. Spleen: Within normal limits. Pancreas: Within normal limits. Adrenals: Within normal limits. Kidneys: No nephrolithiasis or hydronephrosis. Peritoneum/mesenteries: Within normal limits. Extraperitoneum: Within normal limits. Gastrointestinal tract: No evidence of obstruction. Appendix within normal limits. Vascular: Calcified aortoiliac atherosclerosis. No evidence of aneurysm.   PELVIS Ureters: Within normal limits. Bladder: Within normal limits. Reproductive system: Prostatomegaly.   MSK: Similar lytic lesion within the T12 vertebral body with associated compression deformity. Multilevel advanced degenerative changes of the lumbar spine. Osteopenia. Fat-containing umbilical hernia.   IMPRESSION:   1.  No acute findings within the abdomen or pelvis. 2.  No evidence of nephrolithiasis.  CBC Lab Results  Component Value Date   WBC 5.20 04/07/2023   RBC 4.14 (L) 04/07/2023   HGB 12.0 (L) 04/07/2023   HCT 36.0 (L) 04/07/2023   MCV 87.0 04/07/2023   MCH 28.9 04/07/2023   MCHC 33.2 04/07/2023   RDW 14.7 04/07/2023   PLT 229 04/07/2023   MPV 8.5 04/07/2023    CMP Lab Results  Component Value Date   NA 136 04/07/2023   K 4.3 04/07/2023   CL 99 04/07/2023   CO2 30 04/07/2023   BUN 81 (H) 04/07/2023   CREATININE 3.79 (H) 04/07/2023   CALCIUM   9.7 04/07/2023   PROT 7.4 04/07/2023   ALBUMIN 3.9 04/07/2023   BILITOT 0.4 04/07/2023   AST 12 (L) 04/07/2023   ALT 14 04/07/2023   ANIONGAP 7 04/07/2023    Creatinine Lab Results  Component Value Date   CREATININE 3.79 (H) 04/07/2023    A1c  Lab Results  Component Value Date   HGBA1C 7.3 (H) 12/13/2021    PSA  Lab Results  Component Value Date   PSA 6.49 04/07/2023   PSA 8.94 (H) 07/31/2022   PSA 8.36 (H) 04/15/2022   PSA 13.89 (H) 01/09/2022   PSA 15.78 (H) 01/07/2022   PSA 23.85 (H) 08/27/2021       ASSESSMENT and PLAN 1. Malignant neoplasm of prostate (CMD)  Urinalysis without Microscopic   Surgical Urine Culture, Sterile Collection    2. Dysuria  Surgical Urine Culture, Sterile Collection      73 y.o. male presents for follow up.  Patient expresses signs and symptoms of UTI.  Will get UA and urine culture today.  Patient with PVR of 177 mL in clinic today.  Can plan for annual  follow-ups with patient to monitor given his baseline incontinence and concerns of UTI but patient would rather follow-up as needed and call us  if something changes.  Patient with prior diagnosis of high risk prostate cancer.  Has planned follow-up with oncology in February 2025.  Last PSA of 6.49 in July 2024.  Patient is agreeable to plan and all questions answered.   Advised the following: 1.  UA, urine culture today 2.  Follow-up as needed per patient preference    Return if symptoms worsen or fail to improve.   It has been explained that the patient is to follow regularly with their PCP in addition to all other providers involved in their care and to follow instructions provided by these respective offices. However, patient will contact the clinic if any urologic-pertaining questions, concerns, new symptoms or problems arise in the interim period.    After visit summary given to the patient or is available online via [facility] portal.   Pt verbalized understanding and  agreement.   Total time spent on day of visit: 30 minutes  Time calculated for patients includes face-to-face and non face-to-face time that may involve preparing to see patient by reviewing chart, obtaining history and examining patient, placing orders and documenting in the electronic medical record, and coordination of care.  Orders Placed This Encounter  Procedures  . Surgical Urine Culture, Sterile Collection    Standing Status:   Future    Expected Date:   11/04/2023    Expiration Date:   11/03/2024    Release to patient::   Immediate  . Urinalysis without Microscopic    Standing Status:   Future    Expected Date:   11/04/2023    Expiration Date:   11/03/2024    Release to patient::   Immediate    There are no Patient Instructions on file for this visit.

## 2023-11-09 NOTE — Progress Notes (Signed)
 Returned call to [name] to the [employer]. Left message letting her know that the provider wants to put the patient on IV antibiotics and that we need to know what pharmacy to send the orders to and if they can even administer IV antibiotics at the facility. Requested a return call.

## 2023-11-25 NOTE — Telephone Encounter (Signed)
 Spoke to Paige, he already has follow up with oncology set up at the [facility]. No other needs from our office at this time. He is in good hands with Dr. Rosellen

## 2024-07-18 ENCOUNTER — Inpatient Hospital Stay (HOSPITAL_COMMUNITY)
Admission: EM | Admit: 2024-07-18 | Discharge: 2024-07-22 | DRG: 291 | Disposition: A | Discharge status: Skilled Nursing Facility | Attending: Internal Medicine | Admitting: Internal Medicine

## 2024-07-18 ENCOUNTER — Emergency Department (HOSPITAL_COMMUNITY)

## 2024-07-18 ENCOUNTER — Encounter (HOSPITAL_COMMUNITY): Payer: Self-pay | Admitting: *Deleted

## 2024-07-18 ENCOUNTER — Other Ambulatory Visit: Payer: Self-pay

## 2024-07-18 DIAGNOSIS — I10 Essential (primary) hypertension: Secondary | ICD-10-CM | POA: Diagnosis present

## 2024-07-18 DIAGNOSIS — Z8249 Family history of ischemic heart disease and other diseases of the circulatory system: Secondary | ICD-10-CM

## 2024-07-18 DIAGNOSIS — Z7901 Long term (current) use of anticoagulants: Secondary | ICD-10-CM

## 2024-07-18 DIAGNOSIS — I130 Hypertensive heart and chronic kidney disease with heart failure and stage 1 through stage 4 chronic kidney disease, or unspecified chronic kidney disease: Secondary | ICD-10-CM | POA: Diagnosis not present

## 2024-07-18 DIAGNOSIS — R809 Proteinuria, unspecified: Secondary | ICD-10-CM

## 2024-07-18 DIAGNOSIS — N179 Acute kidney failure, unspecified: Principal | ICD-10-CM | POA: Diagnosis present

## 2024-07-18 DIAGNOSIS — I447 Left bundle-branch block, unspecified: Secondary | ICD-10-CM | POA: Diagnosis present

## 2024-07-18 DIAGNOSIS — Z87891 Personal history of nicotine dependence: Secondary | ICD-10-CM

## 2024-07-18 DIAGNOSIS — Z86718 Personal history of other venous thrombosis and embolism: Secondary | ICD-10-CM

## 2024-07-18 DIAGNOSIS — C61 Malignant neoplasm of prostate: Secondary | ICD-10-CM | POA: Diagnosis present

## 2024-07-18 DIAGNOSIS — R5383 Other fatigue: Secondary | ICD-10-CM

## 2024-07-18 DIAGNOSIS — Z66 Do not resuscitate: Secondary | ICD-10-CM | POA: Diagnosis present

## 2024-07-18 DIAGNOSIS — Z79899 Other long term (current) drug therapy: Secondary | ICD-10-CM

## 2024-07-18 DIAGNOSIS — F1490 Cocaine use, unspecified, uncomplicated: Secondary | ICD-10-CM | POA: Diagnosis present

## 2024-07-18 DIAGNOSIS — E8779 Other fluid overload: Secondary | ICD-10-CM | POA: Diagnosis not present

## 2024-07-18 DIAGNOSIS — J449 Chronic obstructive pulmonary disease, unspecified: Secondary | ICD-10-CM | POA: Diagnosis present

## 2024-07-18 DIAGNOSIS — I5043 Acute on chronic combined systolic (congestive) and diastolic (congestive) heart failure: Secondary | ICD-10-CM | POA: Diagnosis present

## 2024-07-18 DIAGNOSIS — Z888 Allergy status to other drugs, medicaments and biological substances status: Secondary | ICD-10-CM

## 2024-07-18 DIAGNOSIS — M79605 Pain in left leg: Secondary | ICD-10-CM

## 2024-07-18 DIAGNOSIS — Z885 Allergy status to narcotic agent status: Secondary | ICD-10-CM

## 2024-07-18 DIAGNOSIS — E8770 Fluid overload, unspecified: Secondary | ICD-10-CM | POA: Diagnosis present

## 2024-07-18 DIAGNOSIS — Y92009 Unspecified place in unspecified non-institutional (private) residence as the place of occurrence of the external cause: Secondary | ICD-10-CM

## 2024-07-18 DIAGNOSIS — E1122 Type 2 diabetes mellitus with diabetic chronic kidney disease: Secondary | ICD-10-CM | POA: Diagnosis present

## 2024-07-18 DIAGNOSIS — N390 Urinary tract infection, site not specified: Secondary | ICD-10-CM

## 2024-07-18 DIAGNOSIS — K219 Gastro-esophageal reflux disease without esophagitis: Secondary | ICD-10-CM | POA: Diagnosis present

## 2024-07-18 DIAGNOSIS — Z91148 Patient's other noncompliance with medication regimen for other reason: Secondary | ICD-10-CM

## 2024-07-18 DIAGNOSIS — N184 Chronic kidney disease, stage 4 (severe): Secondary | ICD-10-CM | POA: Diagnosis present

## 2024-07-18 DIAGNOSIS — E7849 Other hyperlipidemia: Secondary | ICD-10-CM | POA: Diagnosis present

## 2024-07-18 DIAGNOSIS — N400 Enlarged prostate without lower urinary tract symptoms: Secondary | ICD-10-CM | POA: Diagnosis present

## 2024-07-18 DIAGNOSIS — E669 Obesity, unspecified: Secondary | ICD-10-CM | POA: Diagnosis present

## 2024-07-18 DIAGNOSIS — Z6832 Body mass index (BMI) 32.0-32.9, adult: Secondary | ICD-10-CM

## 2024-07-18 DIAGNOSIS — I82509 Chronic embolism and thrombosis of unspecified deep veins of unspecified lower extremity: Secondary | ICD-10-CM | POA: Diagnosis present

## 2024-07-18 DIAGNOSIS — J9601 Acute respiratory failure with hypoxia: Secondary | ICD-10-CM | POA: Diagnosis present

## 2024-07-18 DIAGNOSIS — G40909 Epilepsy, unspecified, not intractable, without status epilepticus: Secondary | ICD-10-CM | POA: Diagnosis present

## 2024-07-18 DIAGNOSIS — Z8546 Personal history of malignant neoplasm of prostate: Secondary | ICD-10-CM

## 2024-07-18 DIAGNOSIS — F1010 Alcohol abuse, uncomplicated: Secondary | ICD-10-CM | POA: Diagnosis present

## 2024-07-18 DIAGNOSIS — I472 Ventricular tachycardia, unspecified: Secondary | ICD-10-CM | POA: Diagnosis not present

## 2024-07-18 DIAGNOSIS — W19XXXA Unspecified fall, initial encounter: Secondary | ICD-10-CM | POA: Diagnosis present

## 2024-07-18 DIAGNOSIS — E1169 Type 2 diabetes mellitus with other specified complication: Secondary | ICD-10-CM | POA: Diagnosis present

## 2024-07-18 DIAGNOSIS — T796XXA Traumatic ischemia of muscle, initial encounter: Secondary | ICD-10-CM | POA: Diagnosis present

## 2024-07-18 DIAGNOSIS — Z7951 Long term (current) use of inhaled steroids: Secondary | ICD-10-CM

## 2024-07-18 DIAGNOSIS — R296 Repeated falls: Secondary | ICD-10-CM | POA: Diagnosis present

## 2024-07-18 DIAGNOSIS — E860 Dehydration: Secondary | ICD-10-CM | POA: Diagnosis present

## 2024-07-18 LAB — URINALYSIS, W/ REFLEX TO CULTURE (INFECTION SUSPECTED)
Bilirubin Urine: NEGATIVE
Glucose, UA: NEGATIVE mg/dL
Ketones, ur: NEGATIVE mg/dL
Nitrite: NEGATIVE
Protein, ur: >=300 mg/dL — AB
Specific Gravity, Urine: 1.013 (ref 1.005–1.030)
pH: 8 (ref 5.0–8.0)

## 2024-07-18 LAB — CBC WITH DIFFERENTIAL/PLATELET
Abs Immature Granulocytes: 0.03 K/uL (ref 0.00–0.07)
Basophils Absolute: 0 K/uL (ref 0.0–0.1)
Basophils Relative: 0 %
Eosinophils Absolute: 0.1 K/uL (ref 0.0–0.5)
Eosinophils Relative: 1 %
HCT: 41.7 % (ref 39.0–52.0)
Hemoglobin: 13.2 g/dL (ref 13.0–17.0)
Immature Granulocytes: 0 %
Lymphocytes Relative: 10 %
Lymphs Abs: 0.7 K/uL (ref 0.7–4.0)
MCH: 28.3 pg (ref 26.0–34.0)
MCHC: 31.7 g/dL (ref 30.0–36.0)
MCV: 89.5 fL (ref 80.0–100.0)
Monocytes Absolute: 0.6 K/uL (ref 0.1–1.0)
Monocytes Relative: 8 %
Neutro Abs: 5.6 K/uL (ref 1.7–7.7)
Neutrophils Relative %: 81 %
Platelets: 247 K/uL (ref 150–400)
RBC: 4.66 MIL/uL (ref 4.22–5.81)
RDW: 13.5 % (ref 11.5–15.5)
WBC: 7 K/uL (ref 4.0–10.5)
nRBC: 0 % (ref 0.0–0.2)

## 2024-07-18 LAB — COMPREHENSIVE METABOLIC PANEL WITH GFR
ALT: 13 U/L (ref 0–44)
AST: 40 U/L (ref 15–41)
Albumin: 4.1 g/dL (ref 3.5–5.0)
Alkaline Phosphatase: 81 U/L (ref 38–126)
Anion gap: 16 — ABNORMAL HIGH (ref 5–15)
BUN: 83 mg/dL — ABNORMAL HIGH (ref 8–23)
CO2: 22 mmol/L (ref 22–32)
Calcium: 9.8 mg/dL (ref 8.9–10.3)
Chloride: 100 mmol/L (ref 98–111)
Creatinine, Ser: 4.24 mg/dL — ABNORMAL HIGH (ref 0.61–1.24)
GFR, Estimated: 14 mL/min — ABNORMAL LOW (ref >60)
Glucose, Bld: 110 mg/dL — ABNORMAL HIGH (ref 70–99)
Potassium: 4.1 mmol/L (ref 3.5–5.1)
Sodium: 137 mmol/L (ref 135–145)
Total Bilirubin: 0.6 mg/dL (ref 0.0–1.2)
Total Protein: 7.9 g/dL (ref 6.5–8.1)

## 2024-07-18 LAB — I-STAT CG4 LACTIC ACID, ED: Lactic Acid, Venous: 1.3 mmol/L (ref 0.5–1.9)

## 2024-07-18 LAB — PRO BRAIN NATRIURETIC PEPTIDE: Pro Brain Natriuretic Peptide: 11553 pg/mL — ABNORMAL HIGH (ref <300.0)

## 2024-07-18 LAB — GLUCOSE, CAPILLARY
Glucose-Capillary: 81 mg/dL (ref 70–99)
Glucose-Capillary: 200 mg/dL — ABNORMAL HIGH (ref 70–99)

## 2024-07-18 LAB — TSH: TSH: 0.609 u[IU]/mL (ref 0.350–4.500)

## 2024-07-18 LAB — CK: Total CK: 730 U/L — ABNORMAL HIGH (ref 49–397)

## 2024-07-18 MED ORDER — ONDANSETRON HCL 4 MG/2ML IJ SOLN: 4.0000 mg | Freq: Four times a day (QID) | INTRAMUSCULAR | Status: DC | PRN

## 2024-07-18 MED ORDER — ACETAMINOPHEN 325 MG PO TABS
650.0000 mg | ORAL_TABLET | Freq: Four times a day (QID) | ORAL | Status: DC | PRN
  Administered 2024-07-18 – 2024-07-22 (×3): 650 mg via ORAL
  Filled 2024-07-18 (×3): qty 2

## 2024-07-18 MED ORDER — ONDANSETRON HCL 4 MG PO TABS: 4.0000 mg | ORAL_TABLET | Freq: Four times a day (QID) | ORAL | Status: DC | PRN

## 2024-07-18 MED ORDER — LEVETIRACETAM 500 MG PO TABS
750.0000 mg | ORAL_TABLET | Freq: Two times a day (BID) | ORAL | Status: DC
  Administered 2024-07-18 – 2024-07-20 (×4): 750 mg via ORAL
  Filled 2024-07-18 (×4): qty 1

## 2024-07-18 MED ORDER — POLYETHYLENE GLYCOL 3350 17 G PO PACK: 17.0000 g | PACK | Freq: Every day | ORAL | Status: DC | PRN

## 2024-07-18 MED ORDER — ACETAMINOPHEN 650 MG RE SUPP: 650.0000 mg | Freq: Four times a day (QID) | RECTAL | Status: DC | PRN

## 2024-07-18 MED ORDER — SODIUM CHLORIDE 0.9 % IV SOLN
2.0000 g | INTRAVENOUS | Status: DC
  Administered 2024-07-19 – 2024-07-20 (×2): 2 g via INTRAVENOUS
  Filled 2024-07-18 (×2): qty 20

## 2024-07-18 MED ORDER — CARVEDILOL 12.5 MG PO TABS
12.5000 mg | ORAL_TABLET | Freq: Two times a day (BID) | ORAL | Status: DC
  Administered 2024-07-18 – 2024-07-19 (×2): 12.5 mg via ORAL
  Filled 2024-07-18 (×2): qty 1

## 2024-07-18 MED ORDER — SODIUM CHLORIDE 0.9 % IV SOLN
1.0000 g | Freq: Once | INTRAVENOUS | Status: AC
  Administered 2024-07-18: 1 g via INTRAVENOUS
  Filled 2024-07-18: qty 10

## 2024-07-18 MED ORDER — APIXABAN 5 MG PO TABS
5.0000 mg | ORAL_TABLET | Freq: Two times a day (BID) | ORAL | Status: DC
  Administered 2024-07-18 – 2024-07-22 (×8): 5 mg via ORAL
  Filled 2024-07-18 (×8): qty 1

## 2024-07-18 MED ORDER — FUROSEMIDE 10 MG/ML IJ SOLN
80.0000 mg | Freq: Once | INTRAMUSCULAR | Status: AC
  Administered 2024-07-18: 80 mg via INTRAVENOUS
  Filled 2024-07-18: qty 8

## 2024-07-18 MED ORDER — INSULIN ASPART 100 UNIT/ML IJ SOLN
0.0000 [IU] | Freq: Three times a day (TID) | INTRAMUSCULAR | Status: DC
  Administered 2024-07-19: 3 [IU] via SUBCUTANEOUS
  Administered 2024-07-19: 1 [IU] via SUBCUTANEOUS
  Administered 2024-07-19: 2 [IU] via SUBCUTANEOUS
  Administered 2024-07-20: 3 [IU] via SUBCUTANEOUS
  Administered 2024-07-20: 1 [IU] via SUBCUTANEOUS
  Administered 2024-07-20: 2 [IU] via SUBCUTANEOUS
  Administered 2024-07-21: 1 [IU] via SUBCUTANEOUS
  Administered 2024-07-21 – 2024-07-22 (×4): 2 [IU] via SUBCUTANEOUS

## 2024-07-18 MED ORDER — HYDRALAZINE HCL 20 MG/ML IJ SOLN
20.0000 mg | INTRAMUSCULAR | Status: DC | PRN
  Administered 2024-07-18: 20 mg via INTRAVENOUS
  Filled 2024-07-18: qty 1

## 2024-07-18 MED ORDER — LACOSAMIDE 50 MG PO TABS
100.0000 mg | ORAL_TABLET | Freq: Two times a day (BID) | ORAL | Status: DC
  Administered 2024-07-18 – 2024-07-22 (×8): 100 mg via ORAL
  Filled 2024-07-18 (×8): qty 2

## 2024-07-18 NOTE — ED Provider Notes (Addendum)
 [place] EMERGENCY DEPARTMENT AT [facility] Provider Note   CSN: [id number] Arrival date & time: 07/18/24  1016     Patient presents with: No chief complaint on file.   Tim Bradley is a 73 y.o. male.   The history is provided by the patient, medical records and the EMS personnel. No language interpreter was used.  Fall This is a new problem. The current episode started 3 to 5 hours ago. The problem has not changed since onset.Pertinent negatives include no chest pain, no abdominal pain, no headaches and no shortness of breath. Nothing aggravates the symptoms. Nothing relieves the symptoms. He has tried nothing for the symptoms. The treatment provided no relief.       Prior to Admission medications   Medication Sig Start Date End Date Taking? Authorizing Provider  acetaminophen  (TYLENOL ) 500 MG tablet Take 1,000 mg by mouth 3 (three) times daily as needed for mild pain.    [provider]  apixaban  (ELIQUIS ) 5 MG TABS tablet Take 1 tablet (5 mg total) by mouth 2 (two) times daily. 01/10/22   Horton, Kristie M, DO  atorvastatin  (LIPITOR ) 80 MG tablet Take 1 tablet (80 mg total) by mouth daily. Patient taking differently: Take 80 mg by mouth every evening. 01/10/22   Horton, Roxie HERO, DO  carvedilol  (COREG ) 12.5 MG tablet Take 1 tablet (12.5 mg total) by mouth 2 (two) times daily. 04/30/22 05/30/22  Arrien, Elidia Sieving, MD  Cholecalciferol  (QC VITAMIN D3) 50 MCG (2000 UT) TABS Take 2,000 Units by mouth daily.    [provider]  finasteride  (PROSCAR ) 5 MG tablet Take 1 tablet (5 mg total) by mouth daily. 05/16/22   Fairy Frames, MD  guaiFENesin  (ROBITUSSIN) 100 MG/5ML liquid Take 200 mg by mouth every 4 (four) hours as needed for cough or to loosen phlegm. Give 10mL (equal to 200mg ) by mouth every four hours as needed for cough x 72 hours. **up to 2400mg  24 hours** 05/11/22   [provider]  HYDROcodone -acetaminophen  (NORCO/VICODIN) 5-325 MG  tablet Take 1 tablet by mouth 3 (three) times daily as needed for moderate pain. 05/26/22   Arrien, Elidia Sieving, MD  ipratropium-albuterol  (DUONEB) 0.5-2.5 (3) MG/3ML SOLN Take 3 mLs by nebulization every 4 (four) hours as needed (shortness of breath/wheezing).    [provider]  Lacosamide  100 MG TABS Take 100 mg by mouth 2 (two) times daily.    [provider]  levETIRAcetam  (KEPPRA ) 750 MG tablet Take 1 tablet (750 mg total) by mouth 2 (two) times daily. 02/21/22   Ghimire, Donalda HERO, MD  Magnesium  Oxide 420 MG TABS Take 420 mg by mouth daily.    [provider]  melatonin 3 MG TABS tablet Take 1 tablet (3 mg total) by mouth at bedtime. 05/15/22   Fairy Frames, MD  mometasone -formoterol  (DULERA ) 200-5 MCG/ACT AERO Inhale 2 puffs into the lungs 2 (two) times daily. 01/31/22 07/05/22  Vernon Ranks, MD  naloxone [facility]) nasal spray 4 mg/0.1 mL Place 1 spray into the nose as needed (opioid overdose).    [provider]  polyethylene glycol (MIRALAX  / GLYCOLAX ) 17 g packet Take 17 g by mouth daily as needed for mild constipation. 05/26/22   Arrien, Elidia Sieving, MD  torsemide  (DEMADEX ) 100 MG tablet Take 1 tablet (100 mg total) by mouth daily. 05/27/22 06/26/22  Arrien, Elidia Sieving, MD    Allergies: Gabapentin , Tramadol , and Trazodone  and nefazodone    Review of Systems  Constitutional:  Negative  for chills, fatigue and fever.  HENT:  Negative for congestion.   Respiratory:  Negative for cough, choking, chest tightness, shortness of breath and wheezing.   Cardiovascular:  Positive for leg swelling. Negative for chest pain and palpitations.  Gastrointestinal:  Negative for abdominal pain, constipation, diarrhea, nausea and vomiting.  Genitourinary:  Negative for dysuria and flank pain.  Musculoskeletal:  Negative for back pain, neck pain and neck stiffness.  Skin:  Negative for rash and wound.  Neurological:  Negative for light-headedness and headaches.   Psychiatric/Behavioral:  Negative for agitation and confusion.   All other systems reviewed and are negative.   Updated Vital Signs BP (!) 182/98 (BP Location: Left Arm)   Pulse 80   Temp 97.8 F (36.6 C) (Oral)   Resp 18   Wt 120.7 kg   SpO2 94%   BMI 34.15 kg/m   Physical Exam Vitals and nursing note reviewed.  Constitutional:      General: He is not in acute distress.    Appearance: He is well-developed. He is not ill-appearing, toxic-appearing or diaphoretic.  HENT:     Head: Normocephalic and atraumatic.     Nose: No congestion or rhinorrhea.     Mouth/Throat:     Mouth: Mucous membranes are moist.  Eyes:     Extraocular Movements: Extraocular movements intact.     Conjunctiva/sclera: Conjunctivae normal.     Pupils: Pupils are equal, round, and reactive to light.  Cardiovascular:     Rate and Rhythm: Normal rate and regular rhythm.     Heart sounds: No murmur heard. Pulmonary:     Effort: Pulmonary effort is normal. No respiratory distress.     Breath sounds: Normal breath sounds. No wheezing, rhonchi or rales.  Chest:     Chest wall: No tenderness.  Abdominal:     General: Abdomen is flat.     Palpations: Abdomen is soft.     Tenderness: There is no abdominal tenderness. There is no right CVA tenderness, left CVA tenderness, guarding or rebound.  Musculoskeletal:        General: Swelling and tenderness present.     Cervical back: Neck supple.     Right lower leg: Edema present.     Left lower leg: Edema present.     Comments: Edema in both legs symmetrically.  Intact pulses distally on my exam initially.  Tenderness in the left ankle and left hip but no laceration seen.  No knee tenderness.  Could wiggle his feet and had sensation symmetrically.  Skin:    General: Skin is warm and dry.     Capillary Refill: Capillary refill takes less than 2 seconds.     Findings: No erythema or rash.  Neurological:     General: No focal deficit present.     Mental  Status: He is alert.     Sensory: No sensory deficit.     Motor: No weakness.  Psychiatric:        Mood and Affect: Mood normal.     (all labs ordered are listed, but only abnormal results are displayed) Labs Reviewed  COMPREHENSIVE METABOLIC PANEL WITH GFR - Abnormal; Notable for the following components:      Result Value   Glucose, Bld 110 (*)    BUN 83 (*)    Creatinine, Ser 4.24 (*)    GFR, Estimated 14 (*)    Anion gap 16 (*)    All other components within normal limits  URINALYSIS, W/ REFLEX  TO CULTURE (INFECTION SUSPECTED) - Abnormal; Notable for the following components:   APPearance CLOUDY (*)    Hgb urine dipstick SMALL (*)    Protein, ur >=300 (*)    Leukocytes,Ua LARGE (*)    Bacteria, UA MANY (*)    All other components within normal limits  CK - Abnormal; Notable for the following components:   Total CK 730 (*)    All other components within normal limits  PRO BRAIN NATRIURETIC PEPTIDE - Abnormal; Notable for the following components:   Pro Brain Natriuretic Peptide 11,553.0 (*)    All other components within normal limits  URINE CULTURE  CBC WITH DIFFERENTIAL/PLATELET  TSH  URINE DRUG SCREEN  I-STAT CG4 LACTIC ACID, ED  I-STAT CG4 LACTIC ACID, ED    EKG: EKG Interpretation Date/Time:  Monday July 18 2024 11:13:00 EDT Ventricular Rate:  76 PR Interval:  166 QRS Duration:  172 QT Interval:  476 QTC Calculation: 535 R Axis:   -51  Text Interpretation: Normal sinus rhythm Left axis deviation Left bundle branch block Abnormal ECG When compared with ECG of 01-Jul-2022 02:39, PREVIOUS ECG IS PRESENT when compared to prior, similar LBBB No STEMI Confirmed by Ginger Barefoot ([id number]) on 07/18/2024 11:17:08 AM  Radiology: [facility] Hip Unilat W or Wo Pelvis 2-3 Views Left Result Date: 07/18/2024 CLINICAL DATA:  Fall, pain. EXAM: DG HIP (WITH OR WITHOUT PELVIS) 2-3V LEFT COMPARISON:  None Available. FINDINGS: No fracture or dislocation. Hip joint space is  maintained bilaterally. Minimal acetabular osteophytosis bilaterally. Degenerative changes in the spine and sacroiliac joints. IMPRESSION: 1. No acute findings. 2. Minimal acetabular osteophytosis bilaterally. 3. Degenerative changes in the spine. Electronically Signed   By: Newell Eke M.D.   On: 07/18/2024 12:56   DG Ankle Complete Left Result Date: 07/18/2024 CLINICAL DATA:  Fall with pain and swelling. EXAM: LEFT ANKLE COMPLETE - 3+ VIEW COMPARISON:  None Available. FINDINGS: Diffuse soft tissue swelling.  No fracture or dislocation. IMPRESSION: Soft tissue swelling without fracture. Electronically Signed   By: Newell Eke M.D.   On: 07/18/2024 12:56     Procedures   Medications Ordered in the ED  cefTRIAXone  (ROCEPHIN ) 1 g in sodium chloride  0.9 % 100 mL IVPB (has no administration in time range)                                    Medical Decision Making Amount and/or Complexity of Data Reviewed Labs: ordered. Radiology: ordered.  Risk Decision regarding hospitalization.    Tim Bradley is a 73 y.o. male with a past medical history significant for CKD, CHF, diabetes, previous prostate cancer, seizures, previous DVT, and previous polysubstance abuse who presents with fall with left leg injuries as well as peripheral edema.  According to patient, for the last 2 weeks or so he has had precipitous swelling in both of his legs symmetrically that is new.  He was told that he might need to go on dialysis at some point but he says he has been taking his diuretic until today which he did not take as he was on the floor.  He said that he was getting up early this morning to help take care of the dogs when he tripped and fell and was pinned between a chair and a table.  He was there for several hours and could not get up.  He primarily is complaining of pain in his  left hip and left ankle but denies any other injuries.  He denies hitting his head.  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  He denies any current chest pain shortness of breath or cough and denies any exertional shortness of breath.  Denies any abdominal pain back pain or flank pain.  Primarily has pain in his left hip and left ankle.  He denies any right leg symptoms.  On exam, lungs were clear.  Chest nontender.  Abdomen nontender.  I did not appreciate a murmur initially.  Back and flanks nontender.  He has tenderness in his left hip and tenderness in his left ankle.  No tenderness in the knee or foot itself.  Intact pulses.  He  had symmetric sensation and could wiggle his feet.  No significant bruising seen initially.  No laceration seen.  He has edema in both legs that he reports is new in the last 2 weeks.  Given the patient's history of DVT, reportedly not taking his blood thinners and bilateral leg swelling will get DVT ultrasounds.  Will get a BNP and labs.  With him telling me that he was having kidney disease that was concerning that may need dialysis will get labs to look for kidney worsening causing his edema.  Will get a CK given his muscle soreness in the leg and being in the same position for several hours.  Will get urinalysis to look for protein or other urinary changes and will get x-rays of the ankle and hip.  Anticipate reassessment after workup to determine disposition.       2:24 PM Workup continues to return.  His urinalysis does show greater than 300 protein with leukocytes and many bacteria.  He does now tell me that he has had urinary frequency so we will give antibiotics to treat for possible UTI.  His proBNP is over 11,000 which is elevated and his creatinine has worsened up to 2.42 up from 2.4 previously.  His CBC is reassuring and his TSH is normal however his CK is 730 likely from being stuck between a chair and a table for several hours earlier today.  His lactic acid was normal.  His imaging did  not show evidence of acute fracture on his hip or ankle.   He is still waiting for the results of the ultrasound however given his AKI, UTI, mild rhabdo, and concern for possible heart failure despite him taking his diuretics at home, do not feel he is safe for discharge home at this time.  Will call for admission and order antibiotics for him.     Final diagnoses:  AKI (acute kidney injury)  Traumatic rhabdomyolysis, initial encounter  Urinary tract infection without hematuria, site unspecified  Proteinuria, unspecified type  Fatigue, unspecified type  Fall, initial encounter  Left leg pain    ED Discharge Orders     None      Clinical Impression: 1. AKI (acute kidney injury)   2. Traumatic rhabdomyolysis, initial encounter   3. Urinary tract infection without hematuria, site unspecified   4. Proteinuria, unspecified type   5. Fatigue, unspecified type   6. Fall, initial encounter   7. Left leg pain     Disposition: Admit  This note was prepared with assistance of Dragon voice recognition software. Occasional wrong-word or sound-a-like substitutions may have occurred due to the inherent limitations of voice recognition software.  Cuahutemoc Attar, Lonni PARAS, MD 07/18/24 1553    Ramiyah Mcclenahan, Lonni PARAS, MD 07/18/24 [phone]

## 2024-07-18 NOTE — Progress Notes (Signed)
 VASCULAR LAB    Patient in ED hallway.  Asked staff to move patient to a room for the scan.  However, patient moved from ED to floor.  Will re-attempt as schedule permits.      Kjell Brannen, RVT 07/18/2024, 7:00 PM

## 2024-07-18 NOTE — ED Triage Notes (Addendum)
 BIB [facility] from home for legs getting tangled in coffee table, b/w coffee table and couch. Stuck since  ~2am. C/o leg pain and leg swelling. EDP present at [facility] on arrival. VSS. H/o DM, but does not take DM meds. Refused CBG check for EMS PTA. H/o CHF and prostate CA. Sleepy, awake, NAD, calm, interactive.

## 2024-07-18 NOTE — H&P (Signed)
 History and Physical    Patient: Tim Bradley FMW:[id number] DOB: 05-07-1951 DOA: 07/18/2024 DOS: the patient was seen and examined on 07/18/2024 PCP: [facility]  Patient coming from: Home  Chief Complaint:  Chief Complaint  Patient presents with   Leg Pain   HPI: Tim Bradley is a 73 y.o. male with medical history significant of left gluteal abscess, GERD, alcohol  abuse, anemia, chronic systolic heart failure, stage IV CKD, cocaine abuse, lumbar DDD, type 2 diabetes, dyspnea, BPH, hypertension, seizure disorder, lumbar spinal stenosis who presented to the emergency department with complaints of bilateral lower extremity pain and edema which has been progressively worse over the last 2 weeks.  No chest pain, palpitations, diaphoresis, PND, positive orthopnea.  He has not been taking his apixaban  or diuretic and other medications for over 4 weeks.  He denied fever, chills, rhinorrhea, sore throat, wheezing or hemoptysis.   No abdominal pain, nausea, emesis, diarrhea, constipation, melena or hematochezia.  No flank pain, dysuria, frequency or hematuria.  No polyuria, polydipsia, polyphagia or blurred vision.   Lab work: Urinalysis shows small hemoglobin, greater than 300 mg/dL protein, large leukocyte esterase, 11-20 RBC, 21-50 WBC and many bacteria.  CBC was normal.  Unremarkable lactic acid and TSH level.  Total CK7 130 units/L.  CMP showed a glucose of 110, BUN 83 and creatinine 4.24 mg/dL (, the rest of the CMP measurements were normal.  Imaging: Left ankle x-rays soft tissue swelling without fracture. Left hip x-ray no acute findings.  Degenerative changes in the spine.  Minimal acetabular osteophytosis bilaterally.  ED course: Initial vital signs were temperature 98.5 F, pulse 81, respirations 17, BP 154/93 mmHg O2 sat 97% on room air.  Review of Systems: unable to review all systems due to the inability of the patient to answer questions. Past Medical History:  Diagnosis Date    Abscess 02/2017   LEFT GLUTEAL    Acid reflux    Alcohol  abuse    Anemia    CHF (congestive heart failure) (HCC)    CKD (chronic kidney disease) stage 4, GFR 15-29 ml/min (HCC) 04/11/2020   Cocaine abuse (HCC)    DDD (degenerative disc disease), lumbar    Diabetes mellitus without complication (HCC)    Dyspnea    Enlarged prostate    Hypertension    Seizures (HCC)    Spinal stenosis, lumbar    Past Surgical History:  Procedure Laterality Date   BACK SURGERY     cervical x2   BIOPSY  08/21/2018   Procedure: BIOPSY;  Surgeon: Donnald Charleston, MD;  Location: [facility];  Service: Endoscopy;;   ESOPHAGOGASTRODUODENOSCOPY (EGD) WITH PROPOFOL  N/A 08/21/2018   Procedure: ESOPHAGOGASTRODUODENOSCOPY (EGD) WITH PROPOFOL ;  Surgeon: Donnald Charleston, MD;  Location: [facility] ENDOSCOPY;  Service: Endoscopy;  Laterality: N/A;   INCISION AND DRAINAGE PERIRECTAL ABSCESS N/A 03/26/2017   Procedure: IRRIGATION AND DEBRIDEMENT PERIRECTAL ABSCESS;  Surgeon: Ebbie Cough, MD;  Location: [facility] OR;  Service: General;  Laterality: N/A;   Social History:  reports that he quit smoking about 7 years ago. His smoking use included cigarettes. He started smoking about 47 years ago. He has a 10 pack-year smoking history. He has never used smokeless tobacco. He reports that he does not currently use alcohol  after a past usage of about 12.0 standard drinks of alcohol  per week. He reports that he does not currently use drugs after having used the following drugs: Cocaine and Marijuana.  Allergies  Allergen Reactions   Gabapentin  Other (See  Comments)    unknown   Tramadol  Other (See Comments)    unknown   Trazodone  And Nefazodone Other (See Comments)    Family reports seizures from this medication    Family History  Problem Relation Age of Onset   Hypertension Mother    Cancer - Lung Father     Prior to Admission medications   Medication Sig Start Date End Date Taking? Authorizing Provider  acetaminophen   (TYLENOL ) 500 MG tablet Take 1,000 mg by mouth 3 (three) times daily as needed for mild pain.    [provider]  apixaban  (ELIQUIS ) 5 MG TABS tablet Take 1 tablet (5 mg total) by mouth 2 (two) times daily. 01/10/22   Horton, Kristie M, DO  atorvastatin  (LIPITOR ) 80 MG tablet Take 1 tablet (80 mg total) by mouth daily. Patient taking differently: Take 80 mg by mouth every evening. 01/10/22   Horton, Roxie HERO, DO  carvedilol  (COREG ) 12.5 MG tablet Take 1 tablet (12.5 mg total) by mouth 2 (two) times daily. 04/30/22 05/30/22  Arrien, Elidia Sieving, MD  Cholecalciferol  (QC VITAMIN D3) 50 MCG (2000 UT) TABS Take 2,000 Units by mouth daily.    [provider]  finasteride  (PROSCAR ) 5 MG tablet Take 1 tablet (5 mg total) by mouth daily. 05/16/22   Fairy Frames, MD  guaiFENesin  (ROBITUSSIN) 100 MG/5ML liquid Take 200 mg by mouth every 4 (four) hours as needed for cough or to loosen phlegm. Give 10mL (equal to 200mg ) by mouth every four hours as needed for cough x 72 hours. **up to 2400mg  24 hours** 05/11/22   [provider]  HYDROcodone -acetaminophen  (NORCO/VICODIN) 5-325 MG tablet Take 1 tablet by mouth 3 (three) times daily as needed for moderate pain. 05/26/22   Arrien, Elidia Sieving, MD  ipratropium-albuterol  (DUONEB) 0.5-2.5 (3) MG/3ML SOLN Take 3 mLs by nebulization every 4 (four) hours as needed (shortness of breath/wheezing).    [provider]  Lacosamide  100 MG TABS Take 100 mg by mouth 2 (two) times daily.    [provider]  levETIRAcetam  (KEPPRA ) 750 MG tablet Take 1 tablet (750 mg total) by mouth 2 (two) times daily. 02/21/22   Ghimire, Donalda HERO, MD  Magnesium  Oxide 420 MG TABS Take 420 mg by mouth daily.    [provider]  melatonin 3 MG TABS tablet Take 1 tablet (3 mg total) by mouth at bedtime. 05/15/22   Fairy Frames, MD  mometasone -formoterol  (DULERA ) 200-5 MCG/ACT AERO Inhale 2 puffs into the lungs 2 (two) times daily. 01/31/22 07/05/22   Vernon Ranks, MD  naloxone [facility]) nasal spray 4 mg/0.1 mL Place 1 spray into the nose as needed (opioid overdose).    [provider]  polyethylene glycol (MIRALAX  / GLYCOLAX ) 17 g packet Take 17 g by mouth daily as needed for mild constipation. 05/26/22   Arrien, Elidia Sieving, MD  torsemide  (DEMADEX ) 100 MG tablet Take 1 tablet (100 mg total) by mouth daily. 05/27/22 06/26/22  Noralee Elidia Sieving, MD    Physical Exam: Vitals:   07/18/24 1029 07/18/24 1030 07/18/24 1045  BP: (!) 154/93    Pulse: 81    Resp: 17    Temp: 98.5 F (36.9 C)    TempSrc: Oral    SpO2: 97% 97%   Weight:   120.7 kg   Physical Exam Vitals reviewed.  Constitutional:      General: He is sleeping. He is not in acute distress.    Appearance: Normal appearance. He is obese. He is  ill-appearing.  HENT:     Head: Normocephalic.     Nose: No rhinorrhea.     Mouth/Throat:     Mouth: Mucous membranes are moist.  Eyes:     General: No scleral icterus.    Pupils: Pupils are equal, round, and reactive to light.  Cardiovascular:     Rate and Rhythm: Normal rate and regular rhythm.  Pulmonary:     Breath sounds: Rales present. No wheezing or rhonchi.  Abdominal:     General: Bowel sounds are normal. There is no distension.     Palpations: Abdomen is soft.     Tenderness: There is no abdominal tenderness. There is no right CVA tenderness or left CVA tenderness.  Musculoskeletal:     Cervical back: Neck supple.     Right lower leg: Edema present.     Left lower leg: Edema present.  Skin:    General: Skin is warm and dry.  Neurological:     General: No focal deficit present.     Mental Status: He is oriented to person, place, and time and easily aroused.  Psychiatric:        Mood and Affect: Mood normal.        Behavior: Behavior normal. Behavior is cooperative.     Data Reviewed:  Results are pending, will review when available.  01/26/2022 echocardiogram report. IMPRESSIONS:   1.  Left ventricular ejection fraction, by estimation, is 35 to 40%. The  left ventricle has moderately decreased function. The left ventricle  demonstrates global hypokinesis. There is moderate left ventricular  hypertrophy. Left ventricular diastolic  parameters are indeterminate.   2. Right ventricular systolic function is normal. The right ventricular  size is normal. Tricuspid regurgitation signal is inadequate for assessing  PA pressure.   3. The mitral valve is abnormal. Trivial mitral valve regurgitation. No  evidence of mitral stenosis.   4. The aortic valve has an indeterminant number of cusps. There is mild  calcification of the aortic valve. There is mild thickening of the aortic  valve. Aortic valve regurgitation is not visualized. No aortic stenosis is  present.   5. Aortic dilatation noted. There is mild dilatation of the ascending  aorta, measuring 38 mm.   Assessment and Plan: Principal Problem:   Volume overload In the setting of:   Acute on chronic combined systolic  and diastolic CHF (congestive heart failure) (HCC) Observation/PCU. Supplemental oxygen as needed. Sodium and fluid restriction. Continue furosemide  80 mg IVP x 1 now. Will wait for renal function in a.m. for further dosing. Monitor daily weights, intake and output. Monitor renal function electrolytes. Check transthoracic echocardiogram. Cardiology will evaluate in the morning..  Active Problems:   CKD (chronic kidney disease) stage 4, GFR 15-29 ml/min (HCC) With either: Progression of disease in the last 8 months. Or AKI in the setting of untreated heart failure. Will follow-up renal function and electrolytes.    Hypertension Restart carvedilol  12.5 mg p.o. twice daily. Hydralazine  20 mg IVP every 4 hours as needed.    Type 2 diabetes mellitus with hyperlipidemia (HCC) Carbohydrate modified diet. CBG monitoring with RI SS. Check hemoglobin A1c. May resume atorvastatin  after med rec is  done.    BPH (benign prostatic hyperplasia) May resume finasteride .    Chronic deep vein thrombosis (DVT) (HCC) Resume apixaban  5 mg p.o. twice daily. Will follow Doppler study ordered in the emergency department.    COPD (chronic obstructive pulmonary disease) (HCC) No cough or wheezing at this time.  Supplemental oxygen and bronchodilators as needed.    Seizure disorder (HCC) Resume levetiracetam  750 Mg P.O. Twice Daily Resume lacosamide  100 mg p.o. twice daily.    Obesity (BMI 30-39.9) Updated weight still pending.      Advance Care Planning:   Code Status: Limited: Do not attempt resuscitation (DNR) -DNR-LIMITED -Do Not Intubate/DNI    Consults:   Family Communication:   Severity of Illness: The appropriate patient status for this patient is OBSERVATION. Observation status is judged to be reasonable and necessary in order to provide the required intensity of service to ensure the patient's safety. The patient's presenting symptoms, physical exam findings, and initial radiographic and laboratory data in the context of their medical condition is felt to place them at decreased risk for further clinical deterioration. Furthermore, it is anticipated that the patient will be medically stable for discharge from the hospital within 2 midnights of admission.   Author: Alm Dorn Castor, MD 07/18/2024 2:44 PM  For on call review [email or web address].   This document was prepared using Dragon voice recognition software and may contain some unintended transcription errors.

## 2024-07-18 NOTE — ED Notes (Signed)
 Back from xray. Sleeping/ resting, NAD, calm. Pending venous doppler US  BLE.

## 2024-07-18 NOTE — ED Notes (Signed)
 EDP at [facility]

## 2024-07-19 ENCOUNTER — Observation Stay (HOSPITAL_COMMUNITY)

## 2024-07-19 DIAGNOSIS — E785 Hyperlipidemia, unspecified: Secondary | ICD-10-CM | POA: Diagnosis not present

## 2024-07-19 DIAGNOSIS — G40909 Epilepsy, unspecified, not intractable, without status epilepticus: Secondary | ICD-10-CM

## 2024-07-19 DIAGNOSIS — I5043 Acute on chronic combined systolic (congestive) and diastolic (congestive) heart failure: Secondary | ICD-10-CM | POA: Diagnosis present

## 2024-07-19 DIAGNOSIS — K219 Gastro-esophageal reflux disease without esophagitis: Secondary | ICD-10-CM | POA: Diagnosis present

## 2024-07-19 DIAGNOSIS — I5023 Acute on chronic systolic (congestive) heart failure: Secondary | ICD-10-CM

## 2024-07-19 DIAGNOSIS — W19XXXA Unspecified fall, initial encounter: Secondary | ICD-10-CM | POA: Diagnosis present

## 2024-07-19 DIAGNOSIS — I10 Essential (primary) hypertension: Secondary | ICD-10-CM

## 2024-07-19 DIAGNOSIS — J449 Chronic obstructive pulmonary disease, unspecified: Secondary | ICD-10-CM

## 2024-07-19 DIAGNOSIS — Z8249 Family history of ischemic heart disease and other diseases of the circulatory system: Secondary | ICD-10-CM | POA: Diagnosis not present

## 2024-07-19 DIAGNOSIS — Z7901 Long term (current) use of anticoagulants: Secondary | ICD-10-CM | POA: Diagnosis not present

## 2024-07-19 DIAGNOSIS — E1169 Type 2 diabetes mellitus with other specified complication: Secondary | ICD-10-CM | POA: Diagnosis present

## 2024-07-19 DIAGNOSIS — E669 Obesity, unspecified: Secondary | ICD-10-CM | POA: Diagnosis present

## 2024-07-19 DIAGNOSIS — C61 Malignant neoplasm of prostate: Secondary | ICD-10-CM | POA: Diagnosis present

## 2024-07-19 DIAGNOSIS — F1010 Alcohol abuse, uncomplicated: Secondary | ICD-10-CM | POA: Diagnosis present

## 2024-07-19 DIAGNOSIS — Y92009 Unspecified place in unspecified non-institutional (private) residence as the place of occurrence of the external cause: Secondary | ICD-10-CM | POA: Diagnosis not present

## 2024-07-19 DIAGNOSIS — Z66 Do not resuscitate: Secondary | ICD-10-CM | POA: Diagnosis present

## 2024-07-19 DIAGNOSIS — M7989 Other specified soft tissue disorders: Secondary | ICD-10-CM

## 2024-07-19 DIAGNOSIS — I825Y1 Chronic embolism and thrombosis of unspecified deep veins of right proximal lower extremity: Secondary | ICD-10-CM

## 2024-07-19 DIAGNOSIS — N179 Acute kidney failure, unspecified: Secondary | ICD-10-CM | POA: Diagnosis present

## 2024-07-19 DIAGNOSIS — N400 Enlarged prostate without lower urinary tract symptoms: Secondary | ICD-10-CM | POA: Diagnosis present

## 2024-07-19 DIAGNOSIS — I130 Hypertensive heart and chronic kidney disease with heart failure and stage 1 through stage 4 chronic kidney disease, or unspecified chronic kidney disease: Secondary | ICD-10-CM | POA: Diagnosis present

## 2024-07-19 DIAGNOSIS — N184 Chronic kidney disease, stage 4 (severe): Secondary | ICD-10-CM | POA: Diagnosis present

## 2024-07-19 DIAGNOSIS — E860 Dehydration: Secondary | ICD-10-CM | POA: Diagnosis present

## 2024-07-19 DIAGNOSIS — Z8546 Personal history of malignant neoplasm of prostate: Secondary | ICD-10-CM | POA: Diagnosis not present

## 2024-07-19 DIAGNOSIS — N189 Chronic kidney disease, unspecified: Secondary | ICD-10-CM | POA: Diagnosis not present

## 2024-07-19 DIAGNOSIS — M79605 Pain in left leg: Secondary | ICD-10-CM | POA: Diagnosis present

## 2024-07-19 DIAGNOSIS — Z6832 Body mass index (BMI) 32.0-32.9, adult: Secondary | ICD-10-CM | POA: Diagnosis not present

## 2024-07-19 DIAGNOSIS — Z7951 Long term (current) use of inhaled steroids: Secondary | ICD-10-CM | POA: Diagnosis not present

## 2024-07-19 DIAGNOSIS — J9601 Acute respiratory failure with hypoxia: Secondary | ICD-10-CM | POA: Diagnosis present

## 2024-07-19 DIAGNOSIS — E1122 Type 2 diabetes mellitus with diabetic chronic kidney disease: Secondary | ICD-10-CM | POA: Diagnosis present

## 2024-07-19 DIAGNOSIS — T796XXA Traumatic ischemia of muscle, initial encounter: Secondary | ICD-10-CM | POA: Diagnosis present

## 2024-07-19 DIAGNOSIS — I472 Ventricular tachycardia, unspecified: Secondary | ICD-10-CM | POA: Diagnosis not present

## 2024-07-19 DIAGNOSIS — Z79899 Other long term (current) drug therapy: Secondary | ICD-10-CM | POA: Diagnosis not present

## 2024-07-19 DIAGNOSIS — I5021 Acute systolic (congestive) heart failure: Secondary | ICD-10-CM

## 2024-07-19 LAB — COMPREHENSIVE METABOLIC PANEL WITH GFR
ALT: 14 U/L (ref 0–44)
AST: 32 U/L (ref 15–41)
Albumin: 4 g/dL (ref 3.5–5.0)
Alkaline Phosphatase: 76 U/L (ref 38–126)
Anion gap: 14 (ref 5–15)
BUN: 82 mg/dL — ABNORMAL HIGH (ref 8–23)
CO2: 23 mmol/L (ref 22–32)
Calcium: 9.8 mg/dL (ref 8.9–10.3)
Chloride: 101 mmol/L (ref 98–111)
Creatinine, Ser: 4.13 mg/dL — ABNORMAL HIGH (ref 0.61–1.24)
GFR, Estimated: 14 mL/min — ABNORMAL LOW (ref >60)
Glucose, Bld: 117 mg/dL — ABNORMAL HIGH (ref 70–99)
Potassium: 3.9 mmol/L (ref 3.5–5.1)
Sodium: 138 mmol/L (ref 135–145)
Total Bilirubin: 0.3 mg/dL (ref 0.0–1.2)
Total Protein: 7.8 g/dL (ref 6.5–8.1)

## 2024-07-19 LAB — ECHOCARDIOGRAM COMPLETE
AR max vel: 3.49 cm2
AV Area VTI: 4.18 cm2
AV Area mean vel: 3.24 cm2
AV Mean grad: 3 mmHg
AV Peak grad: 5.7 mmHg
Ao pk vel: 1.19 m/s
Area-P 1/2: 3.5 cm2
S' Lateral: 5.67 cm
Weight: 3954.17 [oz_av]

## 2024-07-19 LAB — CBC
HCT: 41.1 % (ref 39.0–52.0)
Hemoglobin: 13 g/dL (ref 13.0–17.0)
MCH: 28.2 pg (ref 26.0–34.0)
MCHC: 31.6 g/dL (ref 30.0–36.0)
MCV: 89.2 fL (ref 80.0–100.0)
Platelets: 243 K/uL (ref 150–400)
RBC: 4.61 MIL/uL (ref 4.22–5.81)
RDW: 13.6 % (ref 11.5–15.5)
WBC: 5.1 K/uL (ref 4.0–10.5)
nRBC: 0 % (ref 0.0–0.2)

## 2024-07-19 LAB — URINE DRUG SCREEN
Amphetamines: NEGATIVE
Barbiturates: NEGATIVE
Benzodiazepines: NEGATIVE
Cocaine: POSITIVE — AB
Fentanyl: NEGATIVE
Methadone Scn, Ur: NEGATIVE
Opiates: NEGATIVE
Tetrahydrocannabinol: NEGATIVE

## 2024-07-19 LAB — HEMOGLOBIN A1C
Hgb A1c MFr Bld: 6.5 % — ABNORMAL HIGH (ref 4.8–5.6)
Mean Plasma Glucose: 139.85 mg/dL

## 2024-07-19 LAB — MAGNESIUM: Magnesium: 2.9 mg/dL — ABNORMAL HIGH (ref 1.7–2.4)

## 2024-07-19 LAB — MRSA NEXT GEN BY PCR, NASAL: MRSA by PCR Next Gen: DETECTED — AB

## 2024-07-19 LAB — GLUCOSE, CAPILLARY
Glucose-Capillary: 153 mg/dL — ABNORMAL HIGH (ref 70–99)
Glucose-Capillary: 249 mg/dL — ABNORMAL HIGH (ref 70–99)
Glucose-Capillary: 135 mg/dL — ABNORMAL HIGH (ref 70–99)
Glucose-Capillary: 112 mg/dL — ABNORMAL HIGH (ref 70–99)

## 2024-07-19 MED ORDER — ISOSORBIDE MONONITRATE ER 30 MG PO TB24
30.0000 mg | ORAL_TABLET | Freq: Every day | ORAL | Status: DC
  Administered 2024-07-19 – 2024-07-22 (×4): 30 mg via ORAL
  Filled 2024-07-19 (×4): qty 1

## 2024-07-19 MED ORDER — FUROSEMIDE 10 MG/ML IJ SOLN
80.0000 mg | Freq: Two times a day (BID) | INTRAMUSCULAR | Status: DC
  Administered 2024-07-19 – 2024-07-21 (×4): 80 mg via INTRAVENOUS
  Filled 2024-07-19 (×4): qty 8

## 2024-07-19 MED ORDER — PERFLUTREN LIPID MICROSPHERE
1.0000 mL | INTRAVENOUS | Status: AC | PRN
  Administered 2024-07-19: 2 mL via INTRAVENOUS

## 2024-07-19 MED ORDER — HYDRALAZINE HCL 25 MG PO TABS
25.0000 mg | ORAL_TABLET | Freq: Three times a day (TID) | ORAL | Status: DC
  Administered 2024-07-19 – 2024-07-22 (×10): 25 mg via ORAL
  Filled 2024-07-19 (×10): qty 1

## 2024-07-19 MED ORDER — FUROSEMIDE 10 MG/ML IJ SOLN
80.0000 mg | Freq: Every day | INTRAMUSCULAR | Status: DC
  Administered 2024-07-19: 80 mg via INTRAVENOUS
  Filled 2024-07-19: qty 8

## 2024-07-19 NOTE — Progress Notes (Signed)
 OT Cancellation Note  Patient Details Name: Tim Bradley MRN: [id number] DOB: 24-Oct-1950   Cancelled Treatment:    Reason Eval/Treat Not Completed: Medical issues which prohibited therapy. Orders received, chart reviewed. Pt with bilateral leg swelling and awaiting US  LE venus dopplers to rule out acute DVTs. OT will continue to follow and see at later date/time as medically appropriate.   Jaisean Monteforte L. Daniya Aramburo, OTR/L  07/19/24, 8:55 AM

## 2024-07-19 NOTE — TOC Initial Note (Signed)
 Transition of Care [facility]) - Initial/Assessment Note    Patient Details  Name: Tim Bradley MRN: [id number] Date of Birth: 02-Mar-1951  [facility] CM/SW Contact:    Tawni CHRISTELLA Eva, LCSW Phone Number: 07/19/2024, 2:50 PM  Clinical Narrative:                  Pt is a LTC resident with [facility]. Care management to follow for d/c needs.   Expected Discharge Plan: Long Term Nursing Home Barriers to Discharge: Continued Medical Work up   Patient Goals and CMS Choice Patient states their goals for this hospitalization and ongoing recovery are:: returnt to LTC facility          Expected Discharge Plan and Services                                              Prior Living Arrangements/Services                       Activities of Daily Living   ADL Screening (condition at time of admission) Independently performs ADLs?: No Does the patient have a NEW difficulty with bathing/dressing/toileting/self-feeding that is expected to last >3 days?: Yes (Initiates electronic notice to provider for possible OT consult) Does the patient have a NEW difficulty with getting in/out of bed, walking, or climbing stairs that is expected to last >3 days?: No Does the patient have a NEW difficulty with communication that is expected to last >3 days?: No Is the patient deaf or have difficulty hearing?: No Does the patient have difficulty seeing, even when wearing glasses/contacts?: No Does the patient have difficulty concentrating, remembering, or making decisions?: Yes  Permission Sought/Granted                  Emotional Assessment              Admission diagnosis:  Left leg pain [M79.605] Volume overload [E87.70] AKI (acute kidney injury) [N17.9] Fall, initial encounter [W19.XXXA] Traumatic rhabdomyolysis, initial encounter [T79.6XXA] Urinary tract infection without hematuria, site unspecified [N39.0] Fatigue, unspecified type  [R53.83] Proteinuria, unspecified type [R80.9] Patient Active Problem List   Diagnosis Date Noted   Volume overload 07/18/2024   Class 2 obesity 05/22/2022   UTI (urinary tract infection) 05/18/2022   Acute on chronic combined systolic and diastolic CHF (congestive heart failure) (HCC) 05/17/2022   Diabetes mellitus without complication (HCC)    History of prostate cancer 04/25/2022   Obesity (BMI 30-39.9) 04/25/2022   Recurrent seizures (HCC) 02/20/2022   E coli bacteremia 01/29/2022   Situational anxiety 01/26/2022   Physical deconditioning 01/26/2022   Sepsis secondary to UTI (HCC) 01/24/2022   Acute cystitis 01/24/2022   Class 1 obesity 12/22/2021   DNR (do not resuscitate) 12/21/2021   DVT (deep venous thrombosis) (HCC) 12/21/2021   Substance induced mood disorder (HCC) 10/27/2020   Right leg DVT (HCC) 09/30/2020   Seizure disorder (HCC) 09/29/2020   Chronic systolic CHF (congestive heart failure) (HCC) 09/28/2020   AKI (acute kidney injury) 09/28/2020   Pressure injury of skin 05/03/2020   Acute encephalopathy 05/02/2020   Diarrhea 05/02/2020   Acute on chronic systolic CHF (congestive heart failure) (HCC) 04/22/2020   Hypertension 04/11/2020   Leg edema 04/11/2020   Dyspnea 04/11/2020   Acute on chronic congestive heart failure (HCC) 04/11/2020   BPH (  benign prostatic hyperplasia) 04/11/2020   Generalized weakness 04/11/2020   CKD (chronic kidney disease) stage 4, GFR 15-29 ml/min (HCC) 04/11/2020   Chronic deep vein thrombosis (DVT) (HCC) 04/11/2020   COPD (chronic obstructive pulmonary disease) (HCC) 04/11/2020   Seizures (HCC) 04/11/2020   Seizure (HCC) 07/21/2019   Cocaine abuse (HCC)    Gastritis 08/20/2018   Heme positive stool    Acute blood loss anemia secondary to massive gastric ulcer    Hyponatremia    Abscess, gluteal, left 03/26/2017   Type 2 diabetes mellitus with hyperlipidemia (HCC) 03/26/2017   Adrenal nodule 03/26/2017   Closed fracture of body  of thoracic vertebra (HCC) 03/26/2017   Spinal stenosis of lumbar region 03/26/2017   PCP:  [facility] Pharmacy:  No Pharmacies Listed    Social Drivers of Health (SDOH) Social History: SDOH Screenings   Food Insecurity: No Food Insecurity (07/18/2024)  Housing: Low Risk  (07/18/2024)  Transportation Needs: No Transportation Needs (07/18/2024)  Utilities: Not At Risk (07/18/2024)  Alcohol  Screen: Low Risk  (05/12/2022)  Financial Resource Strain: Low Risk  (11/08/2021)   Received from [facility]  Physical Activity: Inactive (05/06/2018)  Social Connections: Moderately Integrated (07/18/2024)  Stress: Stress Concern Present (11/08/2021)   [facility]  Tobacco Use: Medium Risk (07/18/2024)   SDOH Interventions:     Readmission Risk Interventions     No data to display

## 2024-07-19 NOTE — Consult Note (Signed)
 Cardiology Consultation   Patient ID: Tim Bradley MRN: [id number]; DOB: Sep 07, 1951  Admit date: 07/18/2024 Date of Consult: 07/19/2024  PCP:  [facility]   [facility] Providers Cardiologist:  Maude Emmer, MD      Patient Profile: Tim Bradley is a 73 y.o. 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  History of Present Illness: [name] was diagnosed with acute systolic heart failure and seen by Dr. Emmer in 2021. At that time patient had been admitted for peripheral edema and orthopnea. UDS  + cocaine. Echo showed LVEF < 20% with moderate concentric LVH. G1 DD. TAA ~ 41mm. He was started on GDMT though it appears he had multiple re-hospitalizations for HF exacerbations and then was lost to follow up.   From chart review his last touch with cardiology was in 2023 when he was admitted to [facility] with significant volume overload. At that time his diuresis and coreg  were increased and he was recommended to follow-up outpatient, I do not see a follow-up in his chart.   Has a chronic LBBB since 2019 when he was admitted for a GI bleed.   Presented to the ED on 10/20 after patient had fallen. He had remained on the ground for almost 24 hours prior to rescue. Patient at that time reported leg pain and edema. Patient reported to ED that he had been complaint with his diuretic though unable to take it when he was on the floor. Chart review shows possible medication noncompliance.  BP: 154/93  HR 81   ECG: sinus rhythm with LBBB VR 76 LAD TWI in lateral leads [similar to prior] XRAY of hip and ankle without evidence of fracture Echo and DVT US  pending Pertinent  lab work: Cr 4.24      UA infection vs contaminated, culture pending Pro BNP 11553 CK 730 UDS pending  Patient was restarted on eliquis , coreg , and his seizure medications. Received IV lasix  80 mg and now on IV antibiotics. Net IO Since Admission: -840 mL [07/19/24 0939]  On interview, patient shared he reported to the ED via EMS after he had a mechanical fall. He had twisted in his chair to reach something on the coffee table and ended up falling and getting stuck between the coffee table and chair. Denied prodromal symptoms leading to fall. This has been happening more frequently.  Patient shared he has had congestion lately though no fever or cough.  His shortness of breath is at baseline, sometimes noted at rest. Does sleep upright, though denied orthopnea- was told sleeping upright was better for his breathing. Denied PND. Patient does not walk, uses a power wheelchair to get around.  Reported about 4 weeks ago started noticing his energy level decreased, easily falling asleep. Denied lightheadedness/dizziness/syncope/chest pain/palpitations.  No issue with his appetite. +peripheral edema  Reported he has been taking no medications outside this hospital, then mentioned taking an unknown water pill-> also shared he sees cardiology at the [facility], last visit a month ago, unfortunately cannot find records in the care-everywhere. Patient is a poor historian   Past Medical History:  Diagnosis Date   Abscess 02/2017   LEFT GLUTEAL    Acid reflux  Alcohol  abuse    Anemia    CHF (congestive heart failure) (HCC)    CKD (chronic kidney disease) stage 4, GFR 15-29 ml/min (HCC) 04/11/2020   Cocaine abuse (HCC)    DDD (degenerative disc disease), lumbar    Diabetes mellitus without complication (HCC)    Dyspnea    Enlarged prostate    Hypertension    Seizures (HCC)    Spinal stenosis, lumbar     Past Surgical History:  Procedure Laterality Date   BACK SURGERY     cervical x2   BIOPSY   08/21/2018   Procedure: BIOPSY;  Surgeon: Donnald Charleston, MD;  Location: [facility] ENDOSCOPY;  Service: Endoscopy;;   ESOPHAGOGASTRODUODENOSCOPY (EGD) WITH PROPOFOL  N/A 08/21/2018   Procedure: ESOPHAGOGASTRODUODENOSCOPY (EGD) WITH PROPOFOL ;  Surgeon: Donnald Charleston, MD;  Location: [facility] ENDOSCOPY;  Service: Endoscopy;  Laterality: N/A;   INCISION AND DRAINAGE PERIRECTAL ABSCESS N/A 03/26/2017   Procedure: IRRIGATION AND DEBRIDEMENT PERIRECTAL ABSCESS;  Surgeon: Ebbie Cough, MD;  Location: [facility] OR;  Service: General;  Laterality: N/A;       Scheduled Meds:  apixaban   5 mg Oral BID   carvedilol   12.5 mg Oral BID   insulin  aspart  0-9 Units Subcutaneous TID WC   lacosamide   100 mg Oral BID   levETIRAcetam   750 mg Oral BID   Continuous Infusions:  cefTRIAXone  (ROCEPHIN )  IV     PRN Meds: acetaminophen  **OR** acetaminophen , hydrALAZINE , ondansetron  **OR** ondansetron  (ZOFRAN ) IV, polyethylene glycol  Allergies:    Allergies  Allergen Reactions   Gabapentin  Other (See Comments)    unknown   Tramadol  Other (See Comments)    unknown   Trazodone  And Nefazodone Other (See Comments)    Family reports seizures from this medication    Social History:   Social History   Socioeconomic History   Marital status: Widowed    Spouse name: Not on file   Number of children: 0   Years of education: Not on file   Highest education level: High school graduate  Occupational History   Occupation: retired  Tobacco Use   Smoking status: Former    Current packs/day: 0.00    Average packs/day: 0.3 packs/day for 40.0 years (10.0 ttl pk-yrs)    Types: Cigarettes    Start date: 38    Quit date: 2018    Years since quitting: 7.8   Smokeless tobacco: Never  Vaping Use   Vaping status: Never Used  Substance and Sexual Activity   Alcohol  use: Not Currently    Alcohol /week: 12.0 standard drinks of alcohol     Types: 12 Cans of beer per week    Comment: states he quit 3 years ago   Drug use: Not  Currently    Types: Cocaine, Marijuana    Comment: 03/2020    Sexual activity: Not Currently  Other Topics Concern   Not on file  Social History Narrative   [place] veteran   Social Drivers of Health   Financial Resource Strain: Low Risk  (11/08/2021)   Received from [facility]   Overall Financial Resource Strain (CARDIA)    Difficulty of Paying Living Expenses: Not very hard  Food Insecurity: No Food Insecurity (07/18/2024)   Hunger Vital Sign    Worried About Running Out of Food in the Last Year: Never true    Ran Out of Food in the Last Year: Never true  Transportation Needs: No Transportation Needs (07/18/2024)   PRAPARE - Administrator, Civil Service (Medical): No  Lack of Transportation (Non-Medical): No  Physical Activity: Inactive (05/06/2018)   Exercise Vital Sign    Days of Exercise per Week: 0 days    Minutes of Exercise per Session: 0 min  Stress: Stress Concern Present (11/08/2021)   Received from [facility] - Occupational Stress Questionnaire    Feeling of Stress : Very much  Social Connections: Moderately Integrated (07/18/2024)   Social Connection and Isolation Panel    Frequency of Communication with Friends and Family: Twice a week    Frequency of Social Gatherings with Friends and Family: More than three times a week    Attends Religious Services: More than 4 times per year    Active Member of [employer] or Organizations: Yes    Attends Banker Meetings: More than 4 times per year    Marital Status: Widowed  Intimate Partner Violence: Not At Risk (07/18/2024)   Humiliation, Afraid, Rape, and Kick questionnaire    Fear of Current or Ex-Partner: No    Emotionally Abused: No    Physically Abused: No    Sexually Abused: No    Family History:   Family History  Problem Relation Age of Onset   Hypertension Mother    Cancer - Lung Father      ROS:  Please see the history of present  illness.  All other ROS reviewed and negative.     Physical Exam/Data: Vitals:   07/18/24 2216 07/19/24 0120 07/19/24 0454 07/19/24 0500  BP: (!) 156/86 (!) 154/87 (!) 156/89   Pulse: 80 72 69   Resp:   16   Temp: 98.6 F (37 C) 98.6 F (37 C) 98.5 F (36.9 C)   TempSrc: Oral Oral Oral   SpO2: 93% 96% 93%   Weight:    112.1 kg    Intake/Output Summary (Last 24 hours) at 07/19/2024 0913 Last data filed at 07/19/2024 0454 Gross per 24 hour  Intake 460 ml  Output 1300 ml  Net -840 ml      07/19/2024    5:00 AM 07/18/2024   10:45 AM 07/01/2022    1:39 AM  Last 3 Weights  Weight (lbs) 247 lb 2.2 oz 266 lb 266 lb 12.1 oz  Weight (kg) 112.1 kg 120.657 kg 121 kg     Body mass index is 31.73 kg/m.  General:  Chronically ill appearing male in no acute distress HEENT: normal Neck: mild JVD Vascular: Distal pulses 1+ bilaterally Cardiac:  normal S1, S2; RRR; no murmur  Lungs:  clear to auscultation bilaterally, no wheezing, rhonchi or rales  Abd: distended though compressible, nontender, Ext: BLE 1.5 + pitting edema Musculoskeletal:  No deformities, BUE and BLE strength normal and equal Skin: warm and dry  Neuro:  CNs 2-12 intact, no focal abnormalities noted Psych:  fluctuating alertness, easily arousable  EKG:  The EKG was personally reviewed and demonstrates:  see hpi Telemetry:  Telemetry was personally reviewed and demonstrates:  sinus rhythm with LBBB HR avg ~ 70  Relevant CV Studies: Echocardiogram 12/2021 IMPRESSIONS     1. Left ventricular ejection fraction, by estimation, is 35 to 40%. The  left ventricle has moderately decreased function. The left ventricle  demonstrates global hypokinesis. There is moderate left ventricular  hypertrophy. Left ventricular diastolic  parameters are indeterminate.   2. Right ventricular systolic function is normal. The right ventricular  size is normal. Tricuspid regurgitation signal is inadequate for assessing  PA pressure.  3. The mitral valve is abnormal. Trivial mitral valve regurgitation. No  evidence of mitral stenosis.   4. The aortic valve has an indeterminant number of cusps. There is mild  calcification of the aortic valve. There is mild thickening of the aortic  valve. Aortic valve regurgitation is not visualized. No aortic stenosis is  present.   5. Aortic dilatation noted. There is mild dilatation of the ascending  aorta, measuring 38 mm.   Laboratory Data: Chemistry Recent Labs  Lab 07/18/24 1101 07/19/24 0414  NA 137 138  K 4.1 3.9  CL 100 101  CO2 22 23  GLUCOSE 110* 117*  BUN 83* 82*  CREATININE 4.24* 4.13*  CALCIUM  9.8 9.8  GFRNONAA 14* 14*  ANIONGAP 16* 14    Recent Labs  Lab 07/18/24 1101 07/19/24 0414  PROT 7.9 7.8  ALBUMIN 4.1 4.0  AST 40 32  ALT 13 14  ALKPHOS 81 76  BILITOT 0.6 0.3   Hematology Recent Labs  Lab 07/18/24 1101 07/19/24 0414  WBC 7.0 5.1  RBC 4.66 4.61  HGB 13.2 13.0  HCT 41.7 41.1  MCV 89.5 89.2  MCH 28.3 28.2  MCHC 31.7 31.6  RDW 13.5 13.6  PLT 247 243   Thyroid   Recent Labs  Lab 07/18/24 1101  TSH 0.609    BNP Recent Labs  Lab 07/18/24 1101  PROBNP 11,553.0*     Radiology/Studies:  DG Hip Unilat W or Wo Pelvis 2-3 Views Left Result Date: 07/18/2024 CLINICAL DATA:  Fall, pain. EXAM: DG HIP (WITH OR WITHOUT PELVIS) 2-3V LEFT COMPARISON:  None Available. FINDINGS: No fracture or dislocation. Hip joint space is maintained bilaterally. Minimal acetabular osteophytosis bilaterally. Degenerative changes in the spine and sacroiliac joints. IMPRESSION: 1. No acute findings. 2. Minimal acetabular osteophytosis bilaterally. 3. Degenerative changes in the spine. Electronically Signed   By: Newell Eke M.D.   On: 07/18/2024 12:56   DG Ankle Complete Left Result Date: 07/18/2024 CLINICAL DATA:  Fall with pain and swelling. EXAM: LEFT ANKLE COMPLETE - 3+ VIEW COMPARISON:  None Available. FINDINGS: Diffuse soft tissue swelling.  No  fracture or dislocation. IMPRESSION: Soft tissue swelling without fracture. Electronically Signed   By: Newell Eke M.D.   On: 07/18/2024 12:56     Assessment and Plan:  Acute on Chronic systolic heart failure  Patient reported worsening fatigue and peripheral edema, SOB at baseline No chest imaging this admission Pro-BNP 11553 AKI most likely factorial from dehydration and volume overload.  Echo pending   Received IV lasix  80 mg with 1.3L UOP.  Net IO Since Admission: -840 mL [07/19/24 1025] No weight charted, will ask for update as last one was over a year ago On exam appears, volume up  Patient reports he has not been taking any medications prior to this admission, unable to tell me when the last time he did take medications His coreg  was restarted, discontinued this medication as would like him more compensated prior to BB initiation as well as given his degree of fatigue worried about possible low output. LFTS and lactic acid normal on admission.  -start IV lasix  80 mg daily -start imdur 30 mg -start hydralazine  25 mg TID -will hold on BB until after echo results -defer SGLT2i with renal function, current UTI, and wheelchair bound -will defer ARB/ACE/ARNi/MRA with renal function. If he eventually goes on HD, will revisit possible ARB initiation in the future.   I do worry at discharge patient will discontinue medications again, I see  he moves into rehab facilities, unsure why medication noncompliance is an issue then. May need to get social work involved prior to discharge.   Hypertension BP: 165/87 Medications as above  Hyperlipidemia Will obtain lipid panel in the am  Restart lipitor  80 mg  AKI on CKD  Last charted Cr 3.6 10/2023 Cr today 4.13 Per primary  Hx of DVT Use with caution if CrCl is < 25, per UTD may need to dose reduce at this level despite age and weight, will discuss with MD Continue eliquis  5 mg BID, once updated weight obtained will calculate  CrCL  Per primary Seizure disorder T2DM Possible UTI BPH/prostate cancer Polysubstance use Lumbar stenosis COPD   Risk Assessment/Risk Scores:       [place]  Heart Association (NYHA) Functional Class NYHA Class III    For questions or updates, please contact [facility] Please consult [email or web address] for contact info under      Signed, Leontine LOISE Salen, PA-C  07/19/2024 9:13 AM

## 2024-07-19 NOTE — Progress Notes (Signed)
 This RN received a call from [facility]) addressing patients medications and living arrangements. Updated MD and TOC with information.

## 2024-07-19 NOTE — Progress Notes (Addendum)
 PROGRESS NOTE    Tim Bradley  FMW:[id number] DOB: 26-Apr-1951 DOA: 07/18/2024 PCP: [facility]   Brief Narrative:  Tim Bradley is a 73 y.o. male with medical history significant of left gluteal abscess, GERD, alcohol  abuse, anemia, chronic systolic heart failure, stage IV CKD, cocaine abuse, lumbar DDD, type 2 diabetes, dyspnea, BPH, hypertension, seizure disorder, lumbar spinal stenosis who presented to the emergency department with complaints of bilateral lower extremity pain and edema.  Assessment & Plan:   Principal Problem:   Volume overload Active Problems:   Acute on chronic combined systolic and diastolic CHF (congestive heart failure) (HCC)   CKD (chronic kidney disease) stage 4, GFR 15-29 ml/min (HCC)   Hypertension   Type 2 diabetes mellitus with hyperlipidemia (HCC)   BPH (benign prostatic hyperplasia)   Chronic deep vein thrombosis (DVT) (HCC)   COPD (chronic obstructive pulmonary disease) (HCC)   Seizure disorder (HCC)   Obesity (BMI 30-39.9)  Acute on chronic combined systolic and diastolic CHF (congestive heart failure) (HCC) Acute hypoxic respiratory failure - Currently requiring 3 L nasal cannula to maintain sats at rest -typically on room air -Continue diuresis appropriate, follow labs as below - Echo pending - Appreciate cardiology insight recommendations - Exacerbation likely secondary to noncompliance with medication regimen as well as with ongoing cocaine use and abuse -patient has been educated at length about his behavior as it increases his risk of morbidity and mortality   AKI on CKD (chronic kidney disease) stage 4, GFR 15-29 ml/min (HCC) -Potentially worsening baseline CKD, unclear -Presumed acute injury given above, will follow with diuretics -Baseline creatinine 3.3, admitted at 4.3; downtrending somewhat   Hypertension, essential Continue  hydralazine  **carvedilol  on hold per cardiology   Type 2 diabetes mellitus with hyperlipidemia  (HCC) -Continue sliding scale insulin , hypoglycemic protocol -A1c 6.5, moderately well-controlled   BPH (benign prostatic hyperplasia) -Continue finasteride  no urinary symptoms at this time   Chronic deep vein thrombosis (DVT) (HCC) Continue apixaban  5 twice daily, Doppler study pending -Medication compliance discussed at length given concern over medication noncompliance as above   COPD (chronic obstructive pulmonary disease) (HCC) Without acute exacerbation, wean oxygen as above, continue home nebs supportive care, no indication for steroids or antibiotics.   Seizure disorder (HCC) Continue Keppra , Vimpat    Obesity (BMI 30-39.9) Body mass index is 31.73 kg/m.  DVT prophylaxis:  apixaban  (ELIQUIS ) tablet 5 mg  Code Status:   Code Status: Limited: Do not attempt resuscitation (DNR) -DNR-LIMITED -Do Not Intubate/DNI  Family Communication: None present  Status is: Inpatient  Dispo: The patient is from: Home              Anticipated d/c is to: Home               Anticipated d/c date is: To be determined              Patient currently not medically stable for discharge  Consultants:  Cardiology  Procedures:  None  Antimicrobials:  None indicated  Subjective: No acute issues or events overnight, review of systems somewhat limited given patient's somnolence but denies overt chest pain shortness of breath nausea vomiting diarrhea constipation headache fevers or chills  Objective: Vitals:   07/18/24 2216 07/19/24 0120 07/19/24 0454 07/19/24 0500  BP: (!) 156/86 (!) 154/87 (!) 156/89   Pulse: 80 72 69   Resp:   16   Temp: 98.6 F (37 C) 98.6 F (37 C) 98.5 F (36.9 C)   TempSrc:  Oral Oral Oral   SpO2: 93% 96% 93%   Weight:    112.1 kg    Intake/Output Summary (Last 24 hours) at 07/19/2024 0729 Last data filed at 07/19/2024 0454 Gross per 24 hour  Intake 460 ml  Output 1300 ml  Net -840 ml   Filed Weights   07/18/24 1045 07/19/24 0500  Weight: 120.7 kg 112.1  kg    Examination:  General:  Pleasantly resting in bed, No acute distress.  Somnolent, easily arousable but falls asleep quickly HEENT:  Normocephalic atraumatic.  Sclerae nonicteric, noninjected.  Extraocular movements intact bilaterally. Neck:  Without mass or deformity.  Trachea is midline. Lungs: Bilateral rales. Heart: Tachycardic without murmurs rubs or gallops. Abdomen:  Soft, nontender, nondistended.  Without guarding or rebound. Extremities: Without cyanosis, clubbing, 2+ pitting edema bilateral lower extremities. Skin:  Warm and dry, no erythema.   Data Reviewed: I have personally reviewed following labs and imaging studies  CBC: Recent Labs  Lab 07/18/24 1101 07/19/24 0414  WBC 7.0 5.1  NEUTROABS 5.6  --   HGB 13.2 13.0  HCT 41.7 41.1  MCV 89.5 89.2  PLT 247 243   Basic Metabolic Panel: Recent Labs  Lab 07/18/24 1101 07/19/24 0414  NA 137 138  K 4.1 3.9  CL 100 101  CO2 22 23  GLUCOSE 110* 117*  BUN 83* 82*  CREATININE 4.24* 4.13*  CALCIUM  9.8 9.8   GFR: CrCl cannot be calculated (Unknown ideal weight.). Liver Function Tests: Recent Labs  Lab 07/18/24 1101 07/19/24 0414  AST 40 32  ALT 13 14  ALKPHOS 81 76  BILITOT 0.6 0.3  PROT 7.9 7.8  ALBUMIN 4.1 4.0   Cardiac Enzymes: Recent Labs  Lab 07/18/24 1101  CKTOTAL 730*   BNP (last 3 results) Recent Labs    07/18/24 1101  PROBNP 11,553.0*   HbA1C: Recent Labs    07/19/24 0414  HGBA1C 6.5*   CBG: Recent Labs  Lab 07/18/24 1816 07/18/24 2216  GLUCAP 81 200*   Thyroid  Function Tests: Recent Labs    07/18/24 1101  TSH 0.609   Sepsis Labs: Recent Labs  Lab 07/18/24 1109  LATICACIDVEN 1.3   No results found for this or any previous visit (from the past 240 hours).   Radiology Studies: DG Hip Unilat W or Wo Pelvis 2-3 Views Left Result Date: 07/18/2024 CLINICAL DATA:  Fall, pain. EXAM: DG HIP (WITH OR WITHOUT PELVIS) 2-3V LEFT COMPARISON:  None Available. FINDINGS: No  fracture or dislocation. Hip joint space is maintained bilaterally. Minimal acetabular osteophytosis bilaterally. Degenerative changes in the spine and sacroiliac joints. IMPRESSION: 1. No acute findings. 2. Minimal acetabular osteophytosis bilaterally. 3. Degenerative changes in the spine. Electronically Signed   By: Newell Eke M.D.   On: 07/18/2024 12:56   DG Ankle Complete Left Result Date: 07/18/2024 CLINICAL DATA:  Fall with pain and swelling. EXAM: LEFT ANKLE COMPLETE - 3+ VIEW COMPARISON:  None Available. FINDINGS: Diffuse soft tissue swelling.  No fracture or dislocation. IMPRESSION: Soft tissue swelling without fracture. Electronically Signed   By: Newell Eke M.D.   On: 07/18/2024 12:56        Scheduled Meds:  apixaban   5 mg Oral BID   carvedilol   12.5 mg Oral BID   insulin  aspart  0-9 Units Subcutaneous TID WC   lacosamide   100 mg Oral BID   levETIRAcetam   750 mg Oral BID   Continuous Infusions:  cefTRIAXone  (ROCEPHIN )  IV  LOS: 0 days    Time spent:    Tim JAYSON Montclair, DO [facility]  If 7PM-7AM, please contact night-coverage [email or web address]  07/19/2024, 7:29 AM

## 2024-07-20 ENCOUNTER — Inpatient Hospital Stay (HOSPITAL_COMMUNITY)

## 2024-07-20 DIAGNOSIS — I5023 Acute on chronic systolic (congestive) heart failure: Secondary | ICD-10-CM | POA: Diagnosis not present

## 2024-07-20 DIAGNOSIS — N189 Chronic kidney disease, unspecified: Secondary | ICD-10-CM

## 2024-07-20 DIAGNOSIS — N179 Acute kidney failure, unspecified: Secondary | ICD-10-CM | POA: Diagnosis not present

## 2024-07-20 DIAGNOSIS — I5043 Acute on chronic combined systolic (congestive) and diastolic (congestive) heart failure: Secondary | ICD-10-CM | POA: Diagnosis not present

## 2024-07-20 DIAGNOSIS — N184 Chronic kidney disease, stage 4 (severe): Secondary | ICD-10-CM | POA: Diagnosis not present

## 2024-07-20 DIAGNOSIS — E785 Hyperlipidemia, unspecified: Secondary | ICD-10-CM | POA: Diagnosis not present

## 2024-07-20 DIAGNOSIS — I10 Essential (primary) hypertension: Secondary | ICD-10-CM | POA: Diagnosis not present

## 2024-07-20 DIAGNOSIS — E1169 Type 2 diabetes mellitus with other specified complication: Secondary | ICD-10-CM | POA: Diagnosis not present

## 2024-07-20 LAB — CBC
HCT: 40.1 % (ref 39.0–52.0)
Hemoglobin: 11.8 g/dL — ABNORMAL LOW (ref 13.0–17.0)
MCH: 27.1 pg (ref 26.0–34.0)
MCHC: 29.4 g/dL — ABNORMAL LOW (ref 30.0–36.0)
MCV: 92.2 fL (ref 80.0–100.0)
Platelets: 217 K/uL (ref 150–400)
RBC: 4.35 MIL/uL (ref 4.22–5.81)
RDW: 13.9 % (ref 11.5–15.5)
WBC: 5.6 K/uL (ref 4.0–10.5)
nRBC: 0 % (ref 0.0–0.2)

## 2024-07-20 LAB — LIPID PANEL
Cholesterol: 142 mg/dL (ref 0–200)
HDL: 73 mg/dL (ref >40)
LDL Cholesterol: 48 mg/dL (ref 0–99)
Total CHOL/HDL Ratio: 2 ratio
Triglycerides: 108 mg/dL (ref <150)
VLDL: 22 mg/dL (ref 0–40)

## 2024-07-20 LAB — BASIC METABOLIC PANEL WITH GFR
Anion gap: 14 (ref 5–15)
BUN: 84 mg/dL — ABNORMAL HIGH (ref 8–23)
CO2: 21 mmol/L — ABNORMAL LOW (ref 22–32)
Calcium: 9.1 mg/dL (ref 8.9–10.3)
Chloride: 107 mmol/L (ref 98–111)
Creatinine, Ser: 4.24 mg/dL — ABNORMAL HIGH (ref 0.61–1.24)
GFR, Estimated: 14 mL/min — ABNORMAL LOW (ref >60)
Glucose, Bld: 200 mg/dL — ABNORMAL HIGH (ref 70–99)
Potassium: 3.9 mmol/L (ref 3.5–5.1)
Sodium: 141 mmol/L (ref 135–145)

## 2024-07-20 LAB — GLUCOSE, CAPILLARY
Glucose-Capillary: 201 mg/dL — ABNORMAL HIGH (ref 70–99)
Glucose-Capillary: 122 mg/dL — ABNORMAL HIGH (ref 70–99)
Glucose-Capillary: 158 mg/dL — ABNORMAL HIGH (ref 70–99)
Glucose-Capillary: 137 mg/dL — ABNORMAL HIGH (ref 70–99)

## 2024-07-20 MED ORDER — LEVETIRACETAM 500 MG PO TABS
500.0000 mg | ORAL_TABLET | Freq: Two times a day (BID) | ORAL | Status: DC
  Administered 2024-07-20 – 2024-07-22 (×4): 500 mg via ORAL
  Filled 2024-07-20 (×4): qty 1

## 2024-07-20 MED ORDER — ATORVASTATIN CALCIUM 40 MG PO TABS
80.0000 mg | ORAL_TABLET | Freq: Every day | ORAL | Status: DC
  Administered 2024-07-20 – 2024-07-22 (×3): 80 mg via ORAL
  Filled 2024-07-20 (×3): qty 2

## 2024-07-20 NOTE — Progress Notes (Signed)
 Heart Failure Navigator Progress Note  Assessed for Heart & Vascular TOC clinic readiness.  Patient does not meet criteria due to per MD notes patient is wheel chair bound and sees Cardiology thru the [facility]. No HF TOC. .   Navigator will sign off at this time.   Stephane Haddock, BSN, Scientist, clinical (histocompatibility and immunogenetics) Only

## 2024-07-20 NOTE — Progress Notes (Signed)
 PROGRESS NOTE    Tim Bradley  FMW:[id number] DOB: 16-Feb-1951 DOA: 07/18/2024 PCP: [facility]   Brief Narrative:  Tim Bradley is a 73 y.o. male with medical history significant of left gluteal abscess, GERD, alcohol  abuse, anemia, chronic systolic heart failure, stage IV CKD, cocaine abuse, lumbar DDD, type 2 diabetes, dyspnea, BPH, hypertension, seizure disorder, lumbar spinal stenosis who presented to the emergency department with complaints of bilateral lower extremity pain and edema.  Assessment & Plan:   Principal Problem:   Volume overload Active Problems:   Acute on chronic combined systolic and diastolic CHF (congestive heart failure) (HCC)   CKD (chronic kidney disease) stage 4, GFR 15-29 ml/min (HCC)   Hypertension   Type 2 diabetes mellitus with hyperlipidemia (HCC)   BPH (benign prostatic hyperplasia)   Chronic deep vein thrombosis (DVT) (HCC)   COPD (chronic obstructive pulmonary disease) (HCC)   Seizure disorder (HCC)   Obesity (BMI 30-39.9)  Acute on chronic combined systolic and diastolic CHF (congestive heart failure) (HCC) Acute hypoxic respiratory failure - Currently requiring 3 L nasal cannula to maintain sats at rest - on room air at baseline - Continue diuresis appropriate, follow labs as below - Echo notable for reduced EF at 25 to 30%, down from previous 35 to 40% - Appreciate cardiology insight recommendations - Exacerbation likely secondary to noncompliance with medication regimen as well as with ongoing cocaine use and abuse -patient has been educated at length about his behavior as it increases his risk of morbidity and mortality   AKI on CKD (chronic kidney disease) stage 4, GFR 15-29 ml/min (HCC) - Potentially worsening baseline CKD, unclear - Presumed acute injury given above, will follow with diuretics - Baseline creatinine 3.3, admitted at 4.3; downtrending somewhat   Hypertension, essential - Continue  hydralazine  - carvedilol  on  hold per cardiology   Type 2 diabetes mellitus with hyperlipidemia (HCC) - Continue sliding scale insulin , hypoglycemic protocol - A1c 6.5, moderately well-controlled   BPH (benign prostatic hyperplasia) - Continue finasteride  no urinary symptoms at this time   Chronic deep vein thrombosis (DVT) (HCC) Still present on repeat study 07/19/2024 Continue apixaban  5 twice daily, Doppler study pending -Medication compliance discussed at length given concern over medication noncompliance as above   COPD (chronic obstructive pulmonary disease) (HCC) Without acute exacerbation, wean oxygen as above, continue home nebs supportive care, no indication for steroids or antibiotics.   Seizure disorder (HCC) Continue Keppra , Vimpat    Obesity (BMI 30-39.9) Body mass index is 32.13 kg/m.  DVT prophylaxis:  apixaban  (ELIQUIS ) tablet 5 mg  Code Status:   Code Status: Limited: Do not attempt resuscitation (DNR) -DNR-LIMITED -Do Not Intubate/DNI  Family Communication: None present  Status is: Inpatient  Dispo: The patient is from: Home              Anticipated d/c is to: Home               Anticipated d/c date is: To be determined              Patient currently not medically stable for discharge  Consultants:  Cardiology  Procedures:  None  Antimicrobials:  None indicated  Subjective: No acute issues or events overnight, denies nausea vomiting diarrhea constipation headache fevers chills or chest pain  Objective: Vitals:   07/19/24 1550 07/19/24 2115 07/20/24 0500 07/20/24 0517  BP:  132/77  131/74  Pulse:  77  79  Resp:    14  Temp:  97.9 F (36.6 C)  98.2 F (36.8 C)  TempSrc:  Oral  Oral  SpO2: 96% 97%  99%  Weight:   113.5 kg   Height: 6' 2 (1.88 m)       Intake/Output Summary (Last 24 hours) at 07/20/2024 0750 Last data filed at 07/20/2024 0522 Gross per 24 hour  Intake 580 ml  Output 2525 ml  Net -1945 ml   Filed Weights   07/18/24 1045 07/19/24 0500 07/20/24  0500  Weight: 120.7 kg 112.1 kg 113.5 kg    Examination:  General: Pleasantly resting in bed, No acute distress.  Somnolent, easily arousable but falls asleep quickly HEENT: Normocephalic atraumatic.  Sclerae nonicteric, noninjected.  Extraocular movements intact bilaterally. Neck: Without mass or deformity.  Trachea is midline. Lungs: Bilateral rales. Heart: Tachycardic without murmurs rubs or gallops. Abdomen: Soft, nontender, nondistended.  Without guarding or rebound. Extremities: Without cyanosis, clubbing, 1+ pitting edema bilateral lower extremities. Skin: Warm and dry, no erythema.  Data Reviewed: I have personally reviewed following labs and imaging studies  CBC: Recent Labs  Lab 07/18/24 1101 07/19/24 0414 07/20/24 0422  WBC 7.0 5.1 5.6  NEUTROABS 5.6  --   --   HGB 13.2 13.0 11.8*  HCT 41.7 41.1 40.1  MCV 89.5 89.2 92.2  PLT 247 243 217   Basic Metabolic Panel: Recent Labs  Lab 07/18/24 1101 07/19/24 0414 07/20/24 0422  NA 137 138 141  K 4.1 3.9 3.9  CL 100 101 107  CO2 22 23 21*  GLUCOSE 110* 117* 200*  BUN 83* 82* 84*  CREATININE 4.24* 4.13* 4.24*  CALCIUM  9.8 9.8 9.1  MG  --  2.9*  --    GFR: Estimated Creatinine Clearance: 20.2 mL/min (A) (by C-G formula based on SCr of 4.24 mg/dL (H)). Liver Function Tests: Recent Labs  Lab 07/18/24 1101 07/19/24 0414  AST 40 32  ALT 13 14  ALKPHOS 81 76  BILITOT 0.6 0.3  PROT 7.9 7.8  ALBUMIN 4.1 4.0   Cardiac Enzymes: Recent Labs  Lab 07/18/24 1101  CKTOTAL 730*   BNP (last 3 results) Recent Labs    07/18/24 1101  PROBNP 11,553.0*   HbA1C: Recent Labs    07/19/24 0414  HGBA1C 6.5*   CBG: Recent Labs  Lab 07/19/24 0753 07/19/24 1150 07/19/24 1631 07/19/24 2006 07/20/24 0736  GLUCAP 153* 249* 135* 112* 201*   Thyroid  Function Tests: Recent Labs    07/18/24 1101  TSH 0.609   Sepsis Labs: Recent Labs  Lab 07/18/24 1109  LATICACIDVEN 1.3   Recent Results (from the past  240 hours)  MRSA Next Gen by PCR, Nasal     Status: Abnormal   Collection Time: 07/19/24  2:56 PM   Specimen: Nasal Mucosa; Nasal Swab  Result Value Ref Range Status   MRSA by PCR Next Gen DETECTED (A) NOT DETECTED Final    Comment: (NOTE) The GeneXpert MRSA Assay (FDA approved for NASAL specimens only), is one component of a comprehensive MRSA colonization surveillance program. It is not intended to diagnose MRSA infection nor to guide or monitor treatment for MRSA infections. Test performance is not FDA approved in patients less than 26 years old. Performed at [facility], [address], [place], [place] [address]      Radiology Studies: ECHOCARDIOGRAM COMPLETE Result Date: 07/19/2024    ECHOCARDIOGRAM REPORT   Patient Name:   BARTLETT ENKE Welke Date of Exam: 07/19/2024 Medical Rec #:  [id number]  Height:       74.0 in Accession #:    [id number]    Weight:       247.1 lb Date of Birth:  08-31-1951     BSA:          2.378 m Patient Age:    73 years      BP:           133/87 mmHg Patient Gender: M             HR:           75 bpm. Exam Location:  Inpatient Procedure: 2D Echo, Cardiac Doppler, Color Doppler and Intracardiac            Opacification Agent (Both Spectral and Color Flow Doppler were            utilized during procedure). Indications:    CHF-Acute Systolic I50.21  History:        Patient has prior history of Echocardiogram examinations, most                 recent 01/26/2022. CHF; Risk Factors:Diabetes and Hypertension.  Sonographer:    Jayson Gaskins Referring Phys: [id number] DAVID MANUEL ORTIZ IMPRESSIONS  1. No left ventricular thrombus is seen (Definity  contrast was used). There is adverse spherical remodeling of the left ventricle and severe systolic dyssynchrony. Left ventricular ejection fraction, by estimation, is 25 to 30%. The left ventricle has severely decreased function. The left ventricle demonstrates global hypokinesis. The left ventricular internal cavity  size was moderately to severely dilated. There is moderate concentric left ventricular hypertrophy. Indeterminate diastolic filling due to E-A fusion.  2. Right ventricular systolic function is low normal. The right ventricular size is normal.  3. The mitral valve is degenerative. Trivial mitral valve regurgitation.  4. The aortic valve is tricuspid. Aortic valve regurgitation is not visualized.  5. Aortic dilatation noted. There is mild dilatation of the ascending aorta, measuring 39 mm. Comparison(s): Prior images reviewed side by side. The left ventricular function is significantly worse. FINDINGS  Left Ventricle: No left ventricular thrombus is seen (Definity  contrast was used). There is adverse spherical remodeling of the left ventricle and severe systolic dyssynchrony. Left ventricular ejection fraction, by estimation, is 25 to 30%. The left ventricle has severely decreased function. The left ventricle demonstrates global hypokinesis. Definity  contrast agent was given IV to delineate the left ventricular endocardial borders. The left ventricular internal cavity size was moderately to severely dilated. There is moderate concentric left ventricular hypertrophy. Abnormal (paradoxical) septal motion, consistent with left bundle branch block. Indeterminate diastolic filling due to E-A fusion. Right Ventricle: The right ventricular size is normal. No increase in right ventricular wall thickness. Right ventricular systolic function is low normal. Left Atrium: Left atrial size was normal in size. Right Atrium: Right atrial size was normal in size. Pericardium: There is no evidence of pericardial effusion. Mitral Valve: The mitral valve is degenerative in appearance. Trivial mitral valve regurgitation. Tricuspid Valve: The tricuspid valve is grossly normal. Tricuspid valve regurgitation is trivial. Aortic Valve: The aortic valve is tricuspid. Aortic valve regurgitation is not visualized. Aortic valve mean gradient  measures 3.0 mmHg. Aortic valve peak gradient measures 5.7 mmHg. Aortic valve area, by VTI measures 4.18 cm. Pulmonic Valve: The pulmonic valve was grossly normal. Pulmonic valve regurgitation is trivial. No evidence of pulmonic stenosis. Aorta: Aortic dilatation noted and the aortic root is normal in size and structure. There is mild dilatation of the ascending aorta,  measuring 39 mm. Venous: The inferior vena cava was not well visualized. IAS/Shunts: No atrial level shunt detected by color flow Doppler.  LEFT VENTRICLE PLAX 2D LVIDd:         6.40 cm LVIDs:         5.67 cm LV PW:         1.40 cm LV IVS:        1.60 cm LVOT diam:     2.30 cm LV SV:         72 LV SV Index:   30 LVOT Area:     4.15 cm  RIGHT VENTRICLE RV S prime:     17.10 cm/s TAPSE (M-mode): 2.9 cm LEFT ATRIUM             Index        RIGHT ATRIUM           Index LA Vol (A2C):   35.7 ml 15.01 ml/m  RA Area:     16.80 cm LA Vol (A4C):   37.6 ml 15.81 ml/m  RA Volume:   39.10 ml  16.44 ml/m LA Biplane Vol: 38.7 ml 16.27 ml/m  AORTIC VALVE AV Area (Vmax):    3.49 cm AV Area (Vmean):   3.24 cm AV Area (VTI):     4.18 cm AV Vmax:           119.00 cm/s AV Vmean:          86.600 cm/s AV VTI:            0.173 m AV Peak Grad:      5.7 mmHg AV Mean Grad:      3.0 mmHg LVOT Vmax:         99.90 cm/s LVOT Vmean:        67.500 cm/s LVOT VTI:          0.174 m LVOT/AV VTI ratio: 1.01  AORTA Ao Root diam: 3.60 cm Ao Asc diam:  3.80 cm MITRAL VALVE MV Area (PHT): 3.50 cm     SHUNTS MV Decel Time: 217 msec     Systemic VTI:  0.17 m MV E velocity: 63.00 cm/s   Systemic Diam: 2.30 cm MV A velocity: 126.00 cm/s MV E/A ratio:  0.50 Mihai Croitoru MD Electronically signed by Jerel Balding MD Signature Date/Time: 07/19/2024/4:11:12 PM    Final    VAS US  LOWER EXTREMITY VENOUS (DVT) (ONLY [facility] & [facility]) Result Date: 07/19/2024  Lower Venous DVT Study Patient Name:  JENNIE BOLAR Fuerstenberg  Date of Exam:   07/19/2024 Medical Rec #: [id number]      Accession #:    [id number]  Date of Birth: 1951/08/19      Patient Gender: M Patient Age:   70 years Exam Location:  [facility] Procedure:      VAS US  LOWER EXTREMITY VENOUS (DVT) Referring Phys: LONNI SAKAI --------------------------------------------------------------------------------  Indications: Pain, and Swelling. Other Indications: CHF - volume overload. Risk Factors: Extensive DVT in RLE (09/30/2020). Anticoagulation: Eliquis . Limitations: VERY difficult exam due to continous involuntary muscle contraction and immobility. Comparison Study: Previous exam on 12/21/2021 was positive for RLE DVT. Chronic -                   CFV, SFJ, and proximal FV. Age Indeterminate - mid & distal                   FV, and PopV. Performing Technologist: Ezzie Potters RVT, RDMS  Examination  Guidelines: A complete evaluation includes B-mode imaging, spectral Doppler, color Doppler, and power Doppler as needed of all accessible portions of each vessel. Bilateral testing is considered an integral part of a complete examination. Limited examinations for reoccurring indications may be performed as noted. The reflux portion of the exam is performed with the patient in reverse Trendelenburg.  +---------+---------------+---------+-----------+----------+-------------------+ RIGHT    CompressibilityPhasicitySpontaneityPropertiesThrombus Aging      +---------+---------------+---------+-----------+----------+-------------------+ CFV      Partial        Yes      Yes                  mid and distal -                                                          age indeterminate   +---------+---------------+---------+-----------+----------+-------------------+ SFJ      Partial                                      Age Indeterminate   +---------+---------------+---------+-----------+----------+-------------------+ FV Prox  Partial        Yes      Yes                  Chronic              +---------+---------------+---------+-----------+----------+-------------------+ FV Mid   Partial        Yes      Yes                  Age Indeterminate   +---------+---------------+---------+-----------+----------+-------------------+ FV DistalFull           Yes      Yes                                      +---------+---------------+---------+-----------+----------+-------------------+ PFV      Full           Yes      Yes                                      +---------+---------------+---------+-----------+----------+-------------------+ POP      Full                                                             +---------+---------------+---------+-----------+----------+-------------------+ PTV                                                   Not well visualized +---------+---------------+---------+-----------+----------+-------------------+   +---------+---------------+---------+-----------+----------+--------------+ LEFT     CompressibilityPhasicitySpontaneityPropertiesThrombus Aging +---------+---------------+---------+-----------+----------+--------------+ CFV      Full           Yes      Yes                                 +---------+---------------+---------+-----------+----------+--------------+  SFJ      Full                                                        +---------+---------------+---------+-----------+----------+--------------+ FV Prox  Full           Yes      Yes                                 +---------+---------------+---------+-----------+----------+--------------+ FV Mid   Full           Yes      Yes                                 +---------+---------------+---------+-----------+----------+--------------+ FV DistalFull           Yes      Yes                                 +---------+---------------+---------+-----------+----------+--------------+ PFV      Full                                                         +---------+---------------+---------+-----------+----------+--------------+ POP      Full           Yes      Yes                                 +---------+---------------+---------+-----------+----------+--------------+ PTV      Full                                                        +---------+---------------+---------+-----------+----------+--------------+ PERO     Full                                                        +---------+---------------+---------+-----------+----------+--------------+    Summary: BILATERAL: -No evidence of popliteal cyst, bilaterally. RIGHT: - Findings consistent with age indeterminate deep vein thrombosis involving the right common femoral vein, SF junction, and right femoral vein.  - Findings consistent with chronic deep vein thrombosis involving the right femoral vein.  - No cystic structure found in the popliteal fossa.  LEFT: - No evidence of common femoral vein obstruction.  - There is no evidence of chronic venous insufficiency.  *See table(s) above for measurements and observations.    Preliminary    DG Hip Unilat W or Wo Pelvis 2-3 Views Left Result Date: 07/18/2024 CLINICAL DATA:  Fall, pain. EXAM: DG HIP (WITH OR WITHOUT PELVIS) 2-3V LEFT COMPARISON:  None Available. FINDINGS: No fracture or dislocation. Hip joint  space is maintained bilaterally. Minimal acetabular osteophytosis bilaterally. Degenerative changes in the spine and sacroiliac joints. IMPRESSION: 1. No acute findings. 2. Minimal acetabular osteophytosis bilaterally. 3. Degenerative changes in the spine. Electronically Signed   By: Newell Eke M.D.   On: 07/18/2024 12:56   DG Ankle Complete Left Result Date: 07/18/2024 CLINICAL DATA:  Fall with pain and swelling. EXAM: LEFT ANKLE COMPLETE - 3+ VIEW COMPARISON:  None Available. FINDINGS: Diffuse soft tissue swelling.  No fracture or dislocation. IMPRESSION: Soft tissue swelling without fracture. Electronically Signed    By: Newell Eke M.D.   On: 07/18/2024 12:56   Scheduled Meds:  apixaban   5 mg Oral BID   furosemide   80 mg Intravenous BID   hydrALAZINE   25 mg Oral Q8H   insulin  aspart  0-9 Units Subcutaneous TID WC   isosorbide  mononitrate  30 mg Oral Daily   lacosamide   100 mg Oral BID   levETIRAcetam   750 mg Oral BID   Continuous Infusions:  cefTRIAXone  (ROCEPHIN )  IV Stopped (07/19/24 2019)    LOS: 1 day   Time spent:  Elsie JAYSON [place], DO [facility]  If 7PM-7AM, please contact night-coverage [email or web address]  07/20/2024, 7:50 AM

## 2024-07-20 NOTE — Progress Notes (Addendum)
 Progress Note  Patient Name: Tim Bradley Date of Encounter: 07/20/2024 [facility] Cardiologist: Maude Emmer, MD   Interval Summary   Reported feeling better since admission. Improvement in breathing and energy level. Reported he recently moved into his own apartment from the nursing home. Shared he had friends over and they drank airplane bottles of alcohol  and did consume cocaine.   Vital Signs Vitals:   07/19/24 1550 07/19/24 2115 07/20/24 0500 07/20/24 0517  BP:  132/77  131/74  Pulse:  77  79  Resp:    14  Temp:  97.9 F (36.6 C)  98.2 F (36.8 C)  TempSrc:  Oral  Oral  SpO2: 96% 97%  99%  Weight:   113.5 kg   Height: 6' 2 (1.88 m)       Intake/Output Summary (Last 24 hours) at 07/20/2024 0929 Last data filed at 07/20/2024 0522 Gross per 24 hour  Intake 220 ml  Output 2525 ml  Net -2305 ml      07/20/2024    5:00 AM 07/19/2024    5:00 AM 07/18/2024   10:45 AM  Last 3 Weights  Weight (lbs) 250 lb 3.6 oz 247 lb 2.2 oz 266 lb  Weight (kg) 113.5 kg 112.1 kg 120.657 kg      Telemetry/ECG  Sinus rhythm with LBBB HR avg 80 - Personally Reviewed  Physical Exam  GEN: No acute distress.   Neck: Mild JVD Cardiac: RRR, no murmurs, rubs, or gallops.  Respiratory: Clear to auscultation bilaterally. GI: Soft, mildly-tender, distended  MS: trace edema  Assessment & Plan  Tim Bradley is a 73 y.o. male with a hx of chronic systolic heart failure, hypertension, GERD, chronic anemia, stage IV CKD, poly substance use [cocaine & EOTH], lumbar DDD, type 2 diabetes, BPH, seizure disorder, hx of DVT, prostate cancer and lumbar spinal stenosis who presented to the ED after he fell at home where he remained for several hours. Patient noted to have an AKI, Pro BNP 11553 , and to be overtly volume overloaded on exam. Patient had not been taking medications at home. He was given IV diuresis, res-started on medications, and cardiology was consulted.   Acute on Chronic  systolic heart failure  Acute hypoxic respiratory failure Patient reported worsening fatigue and peripheral edema, SOB at baseline Patient reported medication non-compliance prior to admission. UDS + cocaine. Patient reported drinking about 7 airplane bottles of liquor.   Pro-BNP 11553  Echo this admission showed LVEF 25-30% with global hypokinesis and moderate concentric LVH. Low normal RV function. [2023: LVEF 35-40%] Exacerbation and EF reduction most likely from medication non-compliance and substance use.    Receiving IV diuresis. UOP yesterday 2.5L Net IO Since Admission: -2,785 mL [07/20/24 0959] Weight 266lbs -> 247lbs -> 250lb. Unfortunately do not know dry weight.  On exam appears overall euvolemic. Would continue diuresis today as his renal function has remained stable with improvement in symptoms, though now requiring oxygen which could also be contributing to improved cognition and breathing. Will order a CXR. May benefit from a RHC this admission.     -continue IV lasix  80 mg BID -continue imdur 30 mg -continue hydralazine  25 mg TID -defer BB given + cocaine on UDS  -defer SGLT2i with renal function, current UTI, and wheelchair bound -will defer ARB/ACE/ARNi/MRA with renal function. If he eventually goes on HD, will revisit possible ARB initiation in the future.    AKI on CKD  Last charted Cr 3.6 10/2023 Cr today  4.24 Per primary   Hypertension BP: 131/74 Medications as above   Hyperlipidemia LDL  48   HDL 73  Restart lipitor  80 mg   Hx of DVT Use with caution if CrCl is < 25, per UTD may need to dose reduce at this level despite age and weight Continue eliquis  5 mg BID   Per primary Seizure disorder T2DM Possible UTI BPH/prostate cancer Polysubstance use Lumbar stenosis COPD   For questions or updates, please contact [facility] Please consult [email or web address] for contact info under       Signed, Leontine LOISE Salen, PA-C

## 2024-07-20 NOTE — Evaluation (Signed)
 Occupational Therapy Evaluation Patient Details Name: Tim Bradley MRN: [id number] DOB: 1950/12/23 Today's Date: 07/20/2024   History of Present Illness   Tim Bradley is a 73 y.o. male admitted 07/18/24 with BLE pain and edema worsening over the past 2 weeks and a fall. X rays negative for fx. Medically noncompliant for 4 weeks. Dx with Acute on chronic combined systolic and diastolic CHF, DVT, AKI. PMH includes left gluteal abscess, GERD, alcohol  abuse, anemia, chronic systolic heart failure, stage IV CKD, cocaine abuse, lumbar DDD, type 2 diabetes, dyspnea, BPH, hypertension, seizure disorder, lumbar spinal stenosis     Clinical Impressions Pt resident at [name], where he recently returned from SNF. He reports assist for bed mobility and transfers. Max A for bathing/dressing at bed level. Wears depends at baseline and staff assist with toileting needs. Pt with noted tone and developing decreased extension of digits on L hand. Educated Pt to stretch out those fingers (not new/acute, but needed to be addressed) Pt is at baseline for ADL. He was max A for LB dressing, mod A for UB dressing, mod A +2 safety for bed mobility, and min to mod A for OOB transfers. AT this time OT will sign off with plan for OT to follow up at [name] in home setting to maximize safety and independence in ADL and functional transfers in his home environment     If plan is discharge home, recommend the following:   Two people to help with walking and/or transfers;A lot of help with bathing/dressing/bathroom     Functional Status Assessment   Patient has had a recent decline in their functional status and demonstrates the ability to make significant improvements in function in a reasonable and predictable amount of time.     Equipment Recommendations   None recommended by OT     Recommendations for Other Services   PT consult     Precautions/Restrictions   Precautions Precautions:  Fall Recall of Precautions/Restrictions: Intact Restrictions Weight Bearing Restrictions Per Provider Order: No     Mobility Bed Mobility Overal bed mobility: Needs Assistance Bed Mobility: Supine to Sit     Supine to sit: Mod assist, +2 for safety/equipment, HOB elevated     General bed mobility comments: Pt with a very specific routine, therapists facilitated mod A +2 safety simulating his routine at [name]    Transfers Overall transfer level: Needs assistance Equipment used: Rolling walker (2 wheels) Transfers: Sit to/from Stand, Bed to chair/wheelchair/BSC Sit to Stand: Mod assist, +2 physical assistance, +2 safety/equipment (slightly elevated recliner) Stand pivot transfers: Min assist, +2 physical assistance, +2 safety/equipment, From elevated surface (HIGHLY elevated bed)         General transfer comment: Pt cognitive intact to direct therapists in how to assist like his baseline.      Balance Overall balance assessment: Needs assistance Sitting-balance support: Single extremity supported, Bilateral upper extremity supported, Feet supported Sitting balance-Leahy Scale: Poor Sitting balance - Comments: R lateral lean, typically leans against elevated HOB Postural control: Right lateral lean Standing balance support: Bilateral upper extremity supported, Reliant on assistive device for balance Standing balance-Leahy Scale: Zero                             ADL either performed or assessed with clinical judgement   ADL Overall ADL's : Needs assistance/impaired Eating/Feeding: Set up   Grooming: Wash/dry face;Wash/dry hands;Set up;Sitting Grooming Details (indicate cue type and reason): in  recliner, required BUE for balance sitting EOB Upper Body Bathing: Moderate assistance   Lower Body Bathing: Maximal assistance   Upper Body Dressing : Minimal assistance;Sitting   Lower Body Dressing: Maximal assistance;Bed level   Toilet Transfer: Moderate  assistance;+2 for physical assistance;+2 for safety/equipment Toilet Transfer Details (indicate cue type and reason): typically wears depends Toileting- Clothing Manipulation and Hygiene: Moderate assistance;+2 for physical assistance;+2 for safety/equipment;Sit to/from stand       Functional mobility during ADLs: Moderate assistance;+2 for physical assistance;+2 for safety/equipment       Vision Patient Visual Report: No change from baseline       Perception         Praxis         Pertinent Vitals/Pain Pain Assessment Pain Assessment: No/denies pain     Extremity/Trunk Assessment Upper Extremity Assessment Upper Extremity Assessment: LUE deficits/detail LUE Deficits / Details: grossly able to move [facility], limited extension of digits LUE Coordination: decreased gross motor (baseline, educated on stretching fingers)   Lower Extremity Assessment Lower Extremity Assessment: Defer to PT evaluation       Communication Communication Communication: No apparent difficulties   Cognition Arousal: Alert Behavior During Therapy: WFL for tasks assessed/performed Cognition: No family/caregiver present to determine baseline             OT - Cognition Comments: suspect Pt at baseline. He has a legal guardian. Decreased safety awareness                 Following commands: Intact       Cueing  General Comments   Cueing Techniques: Verbal cues  Pt not typically on supplemental O2 - required 3L to maintain >90%. On RA Pt was at 87%   Exercises     Shoulder Instructions      Home Living Family/patient expects to be discharged to:: Assisted living                             Home Equipment: Wheelchair - power   Additional Comments: Pt reports living at [name] 3-4 years      Prior Functioning/Environment Prior Level of Function : Needs assist             Mobility Comments: pt reports sits at bedside ind, staff assisting with stand pivot  transfers bed<>w/c ADLs Comments: pt reports staff assisting with bathing, dressing, toileting    OT Problem List: Decreased activity tolerance;Decreased strength;Decreased safety awareness   OT Treatment/Interventions:        OT Goals(Current goals can be found in the care plan section)   Acute Rehab OT Goals Patient Stated Goal: get back to my apt OT Goal Formulation: With patient Time For Goal Achievement: 08/03/24 Potential to Achieve Goals: Good   OT Frequency:       Co-evaluation PT/OT/SLP Co-Evaluation/Treatment: Yes Reason for Co-Treatment: For patient/therapist safety;To address functional/ADL transfers PT goals addressed during session: Mobility/safety with mobility;Balance;Proper use of DME;Strengthening/ROM OT goals addressed during session: ADL's and self-care;Strengthening/ROM;Proper use of Adaptive equipment and DME      AM-PAC OT 6 Clicks Daily Activity     Outcome Measure Help from another person eating meals?: A Little Help from another person taking care of personal grooming?: A Little Help from another person toileting, which includes using toliet, bedpan, or urinal?: A Lot Help from another person bathing (including washing, rinsing, drying)?: A Lot Help from another person to put on and taking off regular  upper body clothing?: A Lot Help from another person to put on and taking off regular lower body clothing?: A Lot 6 Click Score: 14   End of Session Equipment Utilized During Treatment: Gait belt;Rolling walker (2 wheels);Oxygen (3L) Nurse Communication: Mobility status;Precautions  Activity Tolerance: Patient tolerated treatment well Patient left: in chair;with call bell/phone within reach;with chair alarm set;Other (comment) (chair built up and in drop arm for transfer back)  OT Visit Diagnosis: Muscle weakness (generalized) (M62.81)                Time: 1140-1210 OT Time Calculation (min): 30 min Charges:  OT General Charges $OT Visit: 1  Visit OT Evaluation $OT Eval Moderate Complexity: 1 Mod  Leita DEL OTR/L Acute Rehabilitation Services Office: [phone]   Leita JINNY Odea 07/20/2024, 1:01 PM

## 2024-07-20 NOTE — Evaluation (Signed)
 Physical Therapy Evaluation Patient Details Name: Tim Bradley MRN: [id number] DOB: [birth year] Today's Date: 07/20/2024  History of Present Illness  Tim Bradley is a 73 y.o. male admitted 07/18/24 with BLE pain and edema worsening over the past 2 weeks and a fall. X rays negative for fx. Medically noncompliant for 4 weeks. Dx with Acute on chronic combined systolic and diastolic CHF, DVT, AKI. PMH includes left gluteal abscess, GERD, alcohol  abuse, anemia, chronic systolic heart failure, stage IV CKD, cocaine abuse, lumbar DDD, type 2 diabetes, dyspnea, BPH, hypertension, seizure disorder, lumbar spinal stenosis  Clinical Impression  Pt admitted with above diagnosis. Pt reports resides at [facility], staff assisting with bed mobility and transfers to electric w/c as needed, staff assisting with self care tasks. On eval, pt with generalized weakness, RLE able to perform quad set and SLR with extensor lag but LLE unable to lift off bed without assistance. Pt comes to sitting at bedside with mod A+2, powers up from elevated bedside with min A+2, then pivots to recliner with cues for hand placement and safety. Pt requiring mod A +2 to power up from low seated recliner, cues for hand placement and BLE engagement to rise. No acute PT needs identified, will sign off at this time. No acute PT needs identified; recommend PT follow up at SNF to ensure transitioning back into home environment safely with transfers and mobility.         If plan is discharge home, recommend the following: Two people to help with walking and/or transfers;A lot of help with bathing/dressing/bathroom;Assistance with cooking/housework;Assist for transportation   Can travel by private vehicle        Equipment Recommendations None recommended by PT  Recommendations for Other Services       Functional Status Assessment Patient has not had a recent decline in their functional status     Precautions / Restrictions  Precautions Precautions: Fall Recall of Precautions/Restrictions: Intact Restrictions Weight Bearing Restrictions Per Provider Order: No      Mobility  Bed Mobility Overal bed mobility: Needs Assistance Bed Mobility: Supine to Sit     Supine to sit: Mod assist, +2 for safety/equipment, HOB elevated     General bed mobility comments: Pt with a very specific routine, therapists facilitated mod A +2 safety simulating his routine at [name]    Transfers Overall transfer level: Needs assistance Equipment used: Rolling walker (2 wheels) Transfers: Sit to/from Stand, Bed to chair/wheelchair/BSC Sit to Stand: Mod assist, +2 physical assistance, +2 safety/equipment (slightly elevated recliner) Stand pivot transfers: Min assist, +2 physical assistance, +2 safety/equipment, From elevated surface (HIGHLY elevated bed)         General transfer comment: Pt cognitive intact to direct therapists in how to assist like his baseline.    Ambulation/Gait                  Stairs            Wheelchair Mobility     Tilt Bed    Modified Rankin (Stroke Patients Only)       Balance Overall balance assessment: Needs assistance Sitting-balance support: Single extremity supported, Bilateral upper extremity supported, Feet supported Sitting balance-Leahy Scale: Poor Sitting balance - Comments: R lateral lean, typically leans against elevated HOB Postural control: Right lateral lean Standing balance support: Bilateral upper extremity supported, Reliant on assistive device for balance Standing balance-Leahy Scale: Zero  Pertinent Vitals/Pain Pain Assessment Pain Assessment: No/denies pain    Home Living Family/patient expects to be discharged to:: Assisted living                 Home Equipment: Wheelchair - power Additional Comments: Pt reports living at [name] 3-4 years    Prior Function Prior Level of Function :  Needs assist             Mobility Comments: pt reports sits at bedside ind, staff assisting with stand pivot transfers bed<>w/c ADLs Comments: pt reports staff assisting with bathing, dressing, toileting     Extremity/Trunk Assessment   Upper Extremity Assessment Upper Extremity Assessment: Defer to OT evaluation LUE Deficits / Details: grossly able to move [facility], limited extension of digits LUE Coordination: decreased gross motor (baseline, educated on stretching fingers)    Lower Extremity Assessment Lower Extremity Assessment: Generalized weakness;RLE deficits/detail;LLE deficits/detail RLE Deficits / Details: ankle AROM WFL, heel slide to ~45 deg while supine, active quad set, SLR with ~10 deg extensor lag RLE Sensation: WNL RLE Coordination: WNL LLE Deficits / Details: ankle AROM WFL, heel slide to ~30 deg while supine, active quad set, unable to perform SLR LLE Sensation: WNL LLE Coordination: WNL       Communication   Communication Communication: No apparent difficulties    Cognition Arousal: Alert Behavior During Therapy: WFL for tasks assessed/performed                             Following commands: Intact       Cueing Cueing Techniques: Verbal cues     General Comments General comments (skin integrity, edema, etc.): Pt on RA desat to 87%, on 3L SpO2>90%    Exercises     Assessment/Plan    PT Assessment All further PT needs can be met in the next venue of care  PT Problem List Decreased strength;Decreased activity tolerance       PT Treatment Interventions      PT Goals (Current goals can be found in the Care Plan section)  Acute Rehab PT Goals Patient Stated Goal: return to [name] PT Goal Formulation: All assessment and education complete, DC therapy    Frequency       Co-evaluation   Reason for Co-Treatment: For patient/therapist safety;To address functional/ADL transfers PT goals addressed during session:  Mobility/safety with mobility;Balance;Proper use of DME;Strengthening/ROM OT goals addressed during session: ADL's and self-care;Strengthening/ROM;Proper use of Adaptive equipment and DME       AM-PAC PT 6 Clicks Mobility  Outcome Measure Help needed turning from your back to your side while in a flat bed without using bedrails?: A Lot Help needed moving from lying on your back to sitting on the side of a flat bed without using bedrails?: A Lot Help needed moving to and from a bed to a chair (including a wheelchair)?: Total Help needed standing up from a chair using your arms (e.g., wheelchair or bedside chair)?: Total Help needed to walk in hospital room?: Total Help needed climbing 3-5 steps with a railing? : Total 6 Click Score: 8    End of Session Equipment Utilized During Treatment: Gait belt Activity Tolerance: Patient tolerated treatment well Patient left: in chair;with call bell/phone within reach;with chair alarm set Nurse Communication: Mobility status PT Visit Diagnosis: Muscle weakness (generalized) (M62.81);Other abnormalities of gait and mobility (R26.89)    Time: 8858-8790 PT Time Calculation (min) (ACUTE ONLY): 28 min  Charges:   PT Evaluation $PT Eval Moderate Complexity: 1 Mod   PT General Charges $$ ACUTE PT VISIT: 1 Visit        Tori Nils Thor PT, DPT 07/20/24, 1:07 PM

## 2024-07-21 ENCOUNTER — Other Ambulatory Visit: Payer: Self-pay

## 2024-07-21 DIAGNOSIS — I5023 Acute on chronic systolic (congestive) heart failure: Secondary | ICD-10-CM | POA: Diagnosis not present

## 2024-07-21 DIAGNOSIS — I5043 Acute on chronic combined systolic (congestive) and diastolic (congestive) heart failure: Secondary | ICD-10-CM | POA: Diagnosis not present

## 2024-07-21 DIAGNOSIS — I10 Essential (primary) hypertension: Secondary | ICD-10-CM | POA: Diagnosis not present

## 2024-07-21 DIAGNOSIS — N189 Chronic kidney disease, unspecified: Secondary | ICD-10-CM | POA: Diagnosis not present

## 2024-07-21 LAB — BASIC METABOLIC PANEL WITH GFR
Anion gap: 13 (ref 5–15)
BUN: 86 mg/dL — ABNORMAL HIGH (ref 8–23)
CO2: 24 mmol/L (ref 22–32)
Calcium: 8.9 mg/dL (ref 8.9–10.3)
Chloride: 105 mmol/L (ref 98–111)
Creatinine, Ser: 4.56 mg/dL — ABNORMAL HIGH (ref 0.61–1.24)
GFR, Estimated: 13 mL/min — ABNORMAL LOW (ref >60)
Glucose, Bld: 217 mg/dL — ABNORMAL HIGH (ref 70–99)
Potassium: 4.1 mmol/L (ref 3.5–5.1)
Sodium: 141 mmol/L (ref 135–145)

## 2024-07-21 LAB — MAGNESIUM: Magnesium: 2.5 mg/dL — ABNORMAL HIGH (ref 1.7–2.4)

## 2024-07-21 LAB — CBC
HCT: 38.7 % — ABNORMAL LOW (ref 39.0–52.0)
Hemoglobin: 11.7 g/dL — ABNORMAL LOW (ref 13.0–17.0)
MCH: 27.6 pg (ref 26.0–34.0)
MCHC: 30.2 g/dL (ref 30.0–36.0)
MCV: 91.3 fL (ref 80.0–100.0)
Platelets: 228 K/uL (ref 150–400)
RBC: 4.24 MIL/uL (ref 4.22–5.81)
RDW: 13.7 % (ref 11.5–15.5)
WBC: 6.2 K/uL (ref 4.0–10.5)
nRBC: 0 % (ref 0.0–0.2)

## 2024-07-21 LAB — GLUCOSE, CAPILLARY
Glucose-Capillary: 140 mg/dL — ABNORMAL HIGH (ref 70–99)
Glucose-Capillary: 197 mg/dL — ABNORMAL HIGH (ref 70–99)
Glucose-Capillary: 103 mg/dL — ABNORMAL HIGH (ref 70–99)
Glucose-Capillary: 158 mg/dL — ABNORMAL HIGH (ref 70–99)

## 2024-07-21 MED ORDER — CHLORHEXIDINE GLUCONATE CLOTH 2 % EX PADS
6.0000 | MEDICATED_PAD | Freq: Every day | CUTANEOUS | Status: DC
  Administered 2024-07-21 – 2024-07-22 (×2): 6 via TOPICAL

## 2024-07-21 MED ORDER — CALCITRIOL 0.25 MCG PO CAPS
0.2500 ug | ORAL_CAPSULE | ORAL | Status: DC
  Administered 2024-07-22: 0.25 ug via ORAL
  Filled 2024-07-21: qty 1

## 2024-07-21 MED ORDER — CARVEDILOL 3.125 MG PO TABS
3.1250 mg | ORAL_TABLET | Freq: Two times a day (BID) | ORAL | Status: DC
  Administered 2024-07-21 – 2024-07-22 (×3): 3.125 mg via ORAL
  Filled 2024-07-21 (×3): qty 1

## 2024-07-21 MED ORDER — FINASTERIDE 5 MG PO TABS
5.0000 mg | ORAL_TABLET | Freq: Every day | ORAL | Status: DC
  Administered 2024-07-21 – 2024-07-22 (×2): 5 mg via ORAL
  Filled 2024-07-21 (×2): qty 1

## 2024-07-21 MED ORDER — MUPIROCIN 2 % EX OINT
1.0000 | TOPICAL_OINTMENT | Freq: Two times a day (BID) | CUTANEOUS | Status: DC
  Administered 2024-07-21 – 2024-07-22 (×2): 1 via NASAL
  Filled 2024-07-21: qty 22

## 2024-07-21 NOTE — Consult Note (Signed)
 [facility]  INPATIENT CONSULTATION  Reason for Consultation: AKI on CKD Requesting Provider:  Dr.   HPI: Tim Bradley is an 73 y.o. male with GERD, EtOH abuse, anemia, HFrEF, cocaine abse, lumbar DDD, type 2 DM, HTN, seizures currently admitted for a/c HFpEF and nephrology is consulted for AKI on CKD.   Presented 10/20 with LE edema and pain x 2 weeks.  Says his SNF stopped his diuretic a few weeks prior. U tox + cocaine - says just 1x recent use when he rented an apartment and drank heavily w friends.  He was admitted and started on lasix  80 IV BID. By I/Os net neg 2.8L for admission, wts don't appear accurate.  TTE showing EF 25-30%.  Cardiology has been following - added imdur/hyral.   BUN 80s, Cr 4.2 on admission, 4.6 today. K normal, bicarb 24.  Hb initially 13, 11.7 today, normal WBC and plt. BP 130-150s during admission.  As a result of bump in Cr his lasix  was stopped after this AM dose.   Today he feels his edema has improved and is about back to baseline.  He has an outpt nephrologist at [facility] - aware of advanced CKD.  Dialysis has been mentioned as a future possibility.  He has some friends who have done it.  He says if it came to that he would choose conservative care/no HD.    PMH: Past Medical History:  Diagnosis Date   Abscess 02/2017   LEFT GLUTEAL    Acid reflux    Alcohol  abuse    Anemia    CHF (congestive heart failure) (HCC)    CKD (chronic kidney disease) stage 4, GFR 15-29 ml/min (HCC) 04/11/2020   Cocaine abuse (HCC)    DDD (degenerative disc disease), lumbar    Diabetes mellitus without complication (HCC)    Dyspnea    Enlarged prostate    Hypertension    Seizures (HCC)    Spinal stenosis, lumbar    PSH: Past Surgical History:  Procedure Laterality Date   BACK SURGERY     cervical x2   BIOPSY  08/21/2018   Procedure: BIOPSY;  Surgeon: Donnald Charleston, MD;  Location: [facility] ENDOSCOPY;  Service: Endoscopy;;   ESOPHAGOGASTRODUODENOSCOPY  (EGD) WITH PROPOFOL  N/A 08/21/2018   Procedure: ESOPHAGOGASTRODUODENOSCOPY (EGD) WITH PROPOFOL ;  Surgeon: Donnald Charleston, MD;  Location: [facility] ENDOSCOPY;  Service: Endoscopy;  Laterality: N/A;   INCISION AND DRAINAGE PERIRECTAL ABSCESS N/A 03/26/2017   Procedure: IRRIGATION AND DEBRIDEMENT PERIRECTAL ABSCESS;  Surgeon: Ebbie Cough, MD;  Location: [facility] OR;  Service: General;  Laterality: N/A;    Past Medical History:  Diagnosis Date   Abscess 02/2017   LEFT GLUTEAL    Acid reflux    Alcohol  abuse    Anemia    CHF (congestive heart failure) (HCC)    CKD (chronic kidney disease) stage 4, GFR 15-29 ml/min (HCC) 04/11/2020   Cocaine abuse (HCC)    DDD (degenerative disc disease), lumbar    Diabetes mellitus without complication (HCC)    Dyspnea    Enlarged prostate    Hypertension    Seizures (HCC)    Spinal stenosis, lumbar     Medications:  I have reviewed the patient's current medications.  Medications Prior to Admission  Medication Sig Dispense Refill   acetaminophen  (TYLENOL ) 500 MG tablet Take 1,000 mg by mouth 3 (three) times daily as needed for mild pain.     apixaban  (ELIQUIS ) 5 MG TABS tablet Take 1 tablet (5 mg  total) by mouth 2 (two) times daily. (Patient taking differently: Take 5 mg by mouth See admin instructions. Take 5 mg by mouth at 10 AM and 6 PM) 60 tablet 3   atorvastatin  (LIPITOR ) 80 MG tablet Take 1 tablet (80 mg total) by mouth daily. (Patient taking differently: Take 80 mg by mouth daily at 6 PM.) 30 tablet 0   BIDIL  20-37.5 MG tablet Take 1 tablet by mouth 3 (three) times daily.     bisacodyl  (DULCOLAX) 5 MG EC tablet Take 5 mg by mouth in the morning and at bedtime.     calcitRIOL (ROCALTROL) 0.25 MCG capsule Take 0.25 mcg by mouth every Monday, Wednesday, and Friday.     calcium  acetate (PHOSLO ) 667 MG capsule Take 667 mg by mouth 3 (three) times daily with meals.     carvedilol  (COREG ) 12.5 MG tablet Take 1 tablet (12.5 mg total) by mouth 2 (two) times  daily. 60 tablet 0   Cholecalciferol  (VITAMIN D3 SUPER STRENGTH) 50 MCG (2000 UT) CAPS Take 2,000 Units by mouth in the morning.     Dextran 70-Hypromellose (ARTIFICIAL TEARS) 0.1-0.3 % SOLN Place 1 drop into both eyes 2 (two) times daily as needed (for dryness).     DULoxetine  (CYMBALTA ) 60 MG capsule Take 60 mg by mouth 2 (two) times daily.     esomeprazole (NEXIUM) 40 MG capsule Take 40 mg by mouth daily at 8 pm.     ferrous sulfate 325 (65 FE) MG tablet Take 325 mg by mouth See admin instructions. Take 325 mg by mouth with breakfast three times a week     finasteride  (PROSCAR ) 5 MG tablet Take 1 tablet (5 mg total) by mouth daily. (Patient taking differently: Take 5 mg by mouth in the morning.)     ipratropium-albuterol  (DUONEB) 0.5-2.5 (3) MG/3ML SOLN Take 3 mLs by nebulization every 4 (four) hours as needed (shortness of breath/wheezing).     Lacosamide  100 MG TABS Take 100 mg by mouth See admin instructions. Take 100 mg by mouth at 10 AM and 6 PM     levETIRAcetam  (KEPPRA ) 750 MG tablet Take 1 tablet (750 mg total) by mouth 2 (two) times daily. (Patient taking differently: Take 750 mg by mouth See admin instructions. Take 750 mg by mouth at 10 AM and 6 PM) 28 tablet 0   Lidocaine -Menthol  4-1 % GEL Apply 1 application  topically See admin instructions. Apply to buttocks 2 times a day as needed for pain     Magnesium  Oxide 420 MG TABS Take 420 mg by mouth daily.     naloxone (NARCAN) nasal spray 4 mg/0.1 mL Place 1 spray into the nose as needed (opioid overdose).     oxyCODONE  (OXY IR/ROXICODONE ) 5 MG immediate release tablet Take 5 mg by mouth See admin instructions. Take 5 mg by mouth at bedtime AND an additional 5 mg two times a day as needed for pain     OXYGEN Inhale 2 L/min into the lungs as needed (for shortness of breath).     polyethylene glycol (MIRALAX  / GLYCOLAX ) 17 g packet Take 17 g by mouth daily as needed for mild constipation. (Patient taking differently: Take 17 g by mouth daily  as needed for mild constipation (mix into 4-8 ounces of a beverage).) 14 each 0   pyrithione zinc  (HEAD AND SHOULDERS) 1 % shampoo Apply 1 application  topically See admin instructions. Shampoo scalp 2 times a week     sennosides-docusate sodium  (SENOKOT-S) 8.6-50 MG  tablet Take 1 tablet by mouth in the morning and at bedtime.     thiamine  (VITAMIN B1) 100 MG tablet Take 100 mg by mouth in the morning.     HYDROcodone -acetaminophen  (NORCO/VICODIN) 5-325 MG tablet Take 1 tablet by mouth 3 (three) times daily as needed for moderate pain. (Patient not taking: Reported on 07/19/2024) 10 tablet 0   mometasone -formoterol  (DULERA ) 200-5 MCG/ACT AERO Inhale 2 puffs into the lungs 2 (two) times daily. (Patient not taking: Reported on 07/19/2024) 1 each 0   torsemide  (DEMADEX ) 100 MG tablet Take 1 tablet (100 mg total) by mouth daily. (Patient not taking: Reported on 07/19/2024) 30 tablet 0    ALLERGIES:   Allergies  Allergen Reactions   Gabapentin  Other (See Comments)    Allergic, per [name]   Tramadol  Other (See Comments)    Allergic, per [name]   Trazodone  And Nefazodone Other (See Comments)    Family reports seizures from this medication - Allergic, per [name]    FAM HX: Family History  Problem Relation Age of Onset   Hypertension Mother    Cancer - Lung Father     Social History:   reports that he quit smoking about 7 years ago. His smoking use included cigarettes. He started smoking about 47 years ago. He has a 10 pack-year smoking history. He has never used smokeless tobacco. He reports that he does not currently use alcohol  after a past usage of about 12.0 standard drinks of alcohol  per week. He reports that he does not currently use drugs after having used the following drugs: Cocaine and Marijuana.  ROS: 12 system ROS neg except per HPI above  Blood pressure (!) 159/95, pulse 77, temperature 98.3 F (36.8 C), temperature source Oral, resp. rate 15, height 6' 2 (1.88  m), weight 113.3 kg, SpO2 100%. PHYSICAL EXAM: Gen: nontoxic lying at 30 deg in bed  Eyes: anicteric, EOMI ENT: edentulous, MMM Neck: thick CV:  RRR, no rub Abd:  soft, obese Back:clear to bases, on 2L [place] GU: male purewick with clear yellow foamy urine in can Extr:  trace RLE edema, LLE no edema Neuro: conversant, oriented   Results for orders placed or performed during the hospital encounter of 07/18/24 (from the past 48 hours)  Urine Drug Screen     Status: Abnormal   Collection Time: 07/19/24  2:48 PM  Result Value Ref Range   Opiates NEGATIVE NEGATIVE   Cocaine POSITIVE (A) NEGATIVE   Benzodiazepines NEGATIVE NEGATIVE   Amphetamines NEGATIVE NEGATIVE   Tetrahydrocannabinol NEGATIVE NEGATIVE   Barbiturates NEGATIVE NEGATIVE   Methadone Scn, Ur NEGATIVE NEGATIVE   Fentanyl  NEGATIVE NEGATIVE    Comment: (NOTE) Drug screen is for Medical Purposes only. Positive results are preliminary only. If confirmation is needed, notify lab within 5 days.  Drug Class                 Cutoff (ng/mL) Amphetamine and metabolites 1000 Barbiturate and metabolites 200 Benzodiazepine              200 Opiates and metabolites     300 Cocaine and metabolites     300 THC                         50 Fentanyl                     5 Methadone  300  Trazodone  is metabolized in vivo to several metabolites,  including pharmacologically active m-CPP, which is excreted in the  urine.  Immunoassay screens for amphetamines and MDMA have potential  cross-reactivity with these compounds and may provide false positive  result.  Performed at [facility], [address] [place], [place] [address]   MRSA Next Gen by PCR, Nasal     Status: Abnormal   Collection Time: 07/19/24  2:56 PM   Specimen: Nasal Mucosa; Nasal Swab  Result Value Ref Range   MRSA by PCR Next Gen DETECTED (A) NOT DETECTED    Comment: (NOTE) The GeneXpert MRSA Assay (FDA approved for NASAL  specimens only), is one component of a comprehensive MRSA colonization surveillance program. It is not intended to diagnose MRSA infection nor to guide or monitor treatment for MRSA infections. Test performance is not FDA approved in patients less than 75 years old. Performed at [facility], [address], [place], [place] [address]   Glucose, capillary     Status: Abnormal   Collection Time: 07/19/24  4:31 PM  Result Value Ref Range   Glucose-Capillary 135 (H) 70 - 99 mg/dL    Comment: Glucose reference range applies only to samples taken after fasting for at least 8 hours.  Glucose, capillary     Status: Abnormal   Collection Time: 07/19/24  8:06 PM  Result Value Ref Range   Glucose-Capillary 112 (H) 70 - 99 mg/dL    Comment: Glucose reference range applies only to samples taken after fasting for at least 8 hours.  Lipid panel     Status: None   Collection Time: 07/20/24  4:22 AM  Result Value Ref Range   Cholesterol 142 0 - 200 mg/dL    Comment:        ATP III CLASSIFICATION:  <200     mg/dL   Desirable  799-760  mg/dL   Borderline High  >=759    mg/dL   High           Triglycerides 108 <150 mg/dL   HDL 73 >59 mg/dL   Total CHOL/HDL Ratio 2.0 RATIO   VLDL 22 0 - 40 mg/dL   LDL Cholesterol 48 0 - 99 mg/dL    Comment:        Total Cholesterol/HDL:CHD Risk Coronary Heart Disease Risk Table                     Men   Women  1/2 Average Risk   3.4   3.3  Average Risk       5.0   4.4  2 X Average Risk   9.6   7.1  3 X Average Risk  23.4   11.0        Use the calculated Patient Ratio above and the CHD Risk Table to determine the patient's CHD Risk.        ATP III CLASSIFICATION (LDL):  <100     mg/dL   Optimal  899-870  mg/dL   Near or Above                    Optimal  130-159  mg/dL   Borderline  839-810  mg/dL   High  >809     mg/dL   Very High Performed at [facility], [address], [place], [place] [address]   CBC      Status: Abnormal   Collection Time:  07/20/24  4:22 AM  Result Value Ref Range   WBC 5.6 4.0 - 10.5 K/uL   RBC 4.35 4.22 - 5.81 MIL/uL   Hemoglobin 11.8 (L) 13.0 - 17.0 g/dL   HCT 59.8 60.9 - 47.9 %   MCV 92.2 80.0 - 100.0 fL   MCH 27.1 26.0 - 34.0 pg   MCHC 29.4 (L) 30.0 - 36.0 g/dL   RDW 86.0 88.4 - 84.4 %   Platelets 217 150 - 400 K/uL   nRBC 0.0 0.0 - 0.2 %    Comment: Performed at [facility], [address] [place], [place] [address]  Basic metabolic panel with GFR     Status: Abnormal   Collection Time: 07/20/24  4:22 AM  Result Value Ref Range   Sodium 141 135 - 145 mmol/L   Potassium 3.9 3.5 - 5.1 mmol/L   Chloride 107 98 - 111 mmol/L   CO2 21 (L) 22 - 32 mmol/L   Glucose, Bld 200 (H) 70 - 99 mg/dL    Comment: Glucose reference range applies only to samples taken after fasting for at least 8 hours.   BUN 84 (H) 8 - 23 mg/dL   Creatinine, Ser 5.75 (H) 0.61 - 1.24 mg/dL   Calcium  9.1 8.9 - 10.3 mg/dL   GFR, Estimated 14 (L) >60 mL/min    Comment: (NOTE) Calculated using the CKD-EPI Creatinine Equation (2021)    Anion gap 14 5 - 15    Comment: Performed at [facility], [address] [place], [place] [address]  Glucose, capillary     Status: Abnormal   Collection Time: 07/20/24  7:36 AM  Result Value Ref Range   Glucose-Capillary 201 (H) 70 - 99 mg/dL    Comment: Glucose reference range applies only to samples taken after fasting for at least 8 hours.  Glucose, capillary     Status: Abnormal   Collection Time: 07/20/24 11:41 AM  Result Value Ref Range   Glucose-Capillary 122 (H) 70 - 99 mg/dL    Comment: Glucose reference range applies only to samples taken after fasting for at least 8 hours.  Glucose, capillary     Status: Abnormal   Collection Time: 07/20/24  4:48 PM  Result Value Ref Range   Glucose-Capillary 158 (H) 70 - 99 mg/dL    Comment: Glucose reference range applies only to samples taken after fasting for at least  8 hours.  Glucose, capillary     Status: Abnormal   Collection Time: 07/20/24  8:52 PM  Result Value Ref Range   Glucose-Capillary 137 (H) 70 - 99 mg/dL    Comment: Glucose reference range applies only to samples taken after fasting for at least 8 hours.  CBC     Status: Abnormal   Collection Time: 07/21/24  4:15 AM  Result Value Ref Range   WBC 6.2 4.0 - 10.5 K/uL   RBC 4.24 4.22 - 5.81 MIL/uL   Hemoglobin 11.7 (L) 13.0 - 17.0 g/dL   HCT 61.2 (L) 60.9 - 47.9 %   MCV 91.3 80.0 - 100.0 fL   MCH 27.6 26.0 - 34.0 pg   MCHC 30.2 30.0 - 36.0 g/dL   RDW 86.2 88.4 - 84.4 %   Platelets 228 150 - 400 K/uL   nRBC 0.0 0.0 - 0.2 %    Comment: Performed at [facility], [address], [place], [place] [address]  Basic metabolic panel with GFR     Status: Abnormal  Collection Time: 07/21/24  4:15 AM  Result Value Ref Range   Sodium 141 135 - 145 mmol/L   Potassium 4.1 3.5 - 5.1 mmol/L   Chloride 105 98 - 111 mmol/L   CO2 24 22 - 32 mmol/L   Glucose, Bld 217 (H) 70 - 99 mg/dL    Comment: Glucose reference range applies only to samples taken after fasting for at least 8 hours.   BUN 86 (H) 8 - 23 mg/dL   Creatinine, Ser 5.43 (H) 0.61 - 1.24 mg/dL   Calcium  8.9 8.9 - 10.3 mg/dL   GFR, Estimated 13 (L) >60 mL/min    Comment: (NOTE) Calculated using the CKD-EPI Creatinine Equation (2021)    Anion gap 13 5 - 15    Comment: Performed at [facility], [address], [place], [place] [address]  Glucose, capillary     Status: Abnormal   Collection Time: 07/21/24  7:49 AM  Result Value Ref Range   Glucose-Capillary 140 (H) 70 - 99 mg/dL    Comment: Glucose reference range applies only to samples taken after fasting for at least 8 hours.  Glucose, capillary     Status: Abnormal   Collection Time: 07/21/24 11:27 AM  Result Value Ref Range   Glucose-Capillary 197 (H) 70 - 99 mg/dL    Comment: Glucose reference range applies only to samples taken after  fasting for at least 8 hours.    US  EKG SITE RITE Result Date: 07/21/2024 If Site Rite image not attached, placement could not be confirmed due to current cardiac rhythm.  DG CHEST PORT 1 VIEW Result Date: 07/20/2024 CLINICAL DATA:  Acute respiratory failure EXAM: PORTABLE CHEST 1 VIEW COMPARISON:  Chest radiograph dated 07/01/2022 FINDINGS: Normal lung volumes. No focal consolidations. No pleural effusion or pneumothorax. Similar enlarged cardiomediastinal silhouette. No acute osseous abnormality. IMPRESSION: 1.  No focal consolidations. 2. Similar cardiomegaly. Electronically Signed   By: Limin  Xu M.D.   On: 07/20/2024 15:39   ECHOCARDIOGRAM COMPLETE Result Date: 07/19/2024    ECHOCARDIOGRAM REPORT   Patient Name:   Tim Bradley Date of Exam: 07/19/2024 Medical Rec #:  [id number]     Height:       74.0 in Accession #:    [id number]    Weight:       247.1 lb Date of Birth:  Aug 19, 1951     BSA:          2.378 m Patient Age:    73 years      BP:           133/87 mmHg Patient Gender: M             HR:           75 bpm. Exam Location:  Inpatient Procedure: 2D Echo, Cardiac Doppler, Color Doppler and Intracardiac            Opacification Agent (Both Spectral and Color Flow Doppler were            utilized during procedure). Indications:    CHF-Acute Systolic I50.21  History:        Patient has prior history of Echocardiogram examinations, most                 recent 01/26/2022. CHF; Risk Factors:Diabetes and Hypertension.  Sonographer:    Jayson Gaskins Referring Phys: [id number] DAVID MANUEL ORTIZ IMPRESSIONS  1. No left ventricular thrombus is seen (Definity  contrast was used). There is adverse spherical remodeling  of the left ventricle and severe systolic dyssynchrony. Left ventricular ejection fraction, by estimation, is 25 to 30%. The left ventricle has severely decreased function. The left ventricle demonstrates global hypokinesis. The left ventricular internal cavity size was moderately to severely  dilated. There is moderate concentric left ventricular hypertrophy. Indeterminate diastolic filling due to E-A fusion.  2. Right ventricular systolic function is low normal. The right ventricular size is normal.  3. The mitral valve is degenerative. Trivial mitral valve regurgitation.  4. The aortic valve is tricuspid. Aortic valve regurgitation is not visualized.  5. Aortic dilatation noted. There is mild dilatation of the ascending aorta, measuring 39 mm. Comparison(s): Prior images reviewed side by side. The left ventricular function is significantly worse. FINDINGS  Left Ventricle: No left ventricular thrombus is seen (Definity  contrast was used). There is adverse spherical remodeling of the left ventricle and severe systolic dyssynchrony. Left ventricular ejection fraction, by estimation, is 25 to 30%. The left ventricle has severely decreased function. The left ventricle demonstrates global hypokinesis. Definity  contrast agent was given IV to delineate the left ventricular endocardial borders. The left ventricular internal cavity size was moderately to severely dilated. There is moderate concentric left ventricular hypertrophy. Abnormal (paradoxical) septal motion, consistent with left bundle branch block. Indeterminate diastolic filling due to E-A fusion. Right Ventricle: The right ventricular size is normal. No increase in right ventricular wall thickness. Right ventricular systolic function is low normal. Left Atrium: Left atrial size was normal in size. Right Atrium: Right atrial size was normal in size. Pericardium: There is no evidence of pericardial effusion. Mitral Valve: The mitral valve is degenerative in appearance. Trivial mitral valve regurgitation. Tricuspid Valve: The tricuspid valve is grossly normal. Tricuspid valve regurgitation is trivial. Aortic Valve: The aortic valve is tricuspid. Aortic valve regurgitation is not visualized. Aortic valve mean gradient measures 3.0 mmHg. Aortic valve peak  gradient measures 5.7 mmHg. Aortic valve area, by VTI measures 4.18 cm. Pulmonic Valve: The pulmonic valve was grossly normal. Pulmonic valve regurgitation is trivial. No evidence of pulmonic stenosis. Aorta: Aortic dilatation noted and the aortic root is normal in size and structure. There is mild dilatation of the ascending aorta, measuring 39 mm. Venous: The inferior vena cava was not well visualized. IAS/Shunts: No atrial level shunt detected by color flow Doppler.  LEFT VENTRICLE PLAX 2D LVIDd:         6.40 cm LVIDs:         5.67 cm LV PW:         1.40 cm LV IVS:        1.60 cm LVOT diam:     2.30 cm LV SV:         72 LV SV Index:   30 LVOT Area:     4.15 cm  RIGHT VENTRICLE RV S prime:     17.10 cm/s TAPSE (M-mode): 2.9 cm LEFT ATRIUM             Index        RIGHT ATRIUM           Index LA Vol (A2C):   35.7 ml 15.01 ml/m  RA Area:     16.80 cm LA Vol (A4C):   37.6 ml 15.81 ml/m  RA Volume:   39.10 ml  16.44 ml/m LA Biplane Vol: 38.7 ml 16.27 ml/m  AORTIC VALVE AV Area (Vmax):    3.49 cm AV Area (Vmean):   3.24 cm AV Area (VTI):     4.18  cm AV Vmax:           119.00 cm/s AV Vmean:          86.600 cm/s AV VTI:            0.173 m AV Peak Grad:      5.7 mmHg AV Mean Grad:      3.0 mmHg LVOT Vmax:         99.90 cm/s LVOT Vmean:        67.500 cm/s LVOT VTI:          0.174 m LVOT/AV VTI ratio: 1.01  AORTA Ao Root diam: 3.60 cm Ao Asc diam:  3.80 cm MITRAL VALVE MV Area (PHT): 3.50 cm     SHUNTS MV Decel Time: 217 msec     Systemic VTI:  0.17 m MV E velocity: 63.00 cm/s   Systemic Diam: 2.30 cm MV A velocity: 126.00 cm/s MV E/A ratio:  0.50 Mihai Croitoru MD Electronically signed by Jerel Balding MD Signature Date/Time: 07/19/2024/4:11:12 PM    Final     Assessment/Plan Tim Bradley is an 73 y.o. male with GERD, EtOH abuse, anemia, HFrEF, cocaine abse, lumbar DDD, type 2 DM, HTN, seizures currently admitted for a/c HFpEF and nephrology is consulted for AKI on CKD.   **A/C HFrEF:  cardiology  consulting and managing meds which are limited by GFR.  Appears nearly euvolemic today - lasix  80 IV BID stopped after this AMs dose after cr rose.  He was previously on torsemide  100 daily it appears but was stopped recently - he'll need a standing diuretic to maintain euvolemia going forward.  Likely can resume torsemide  100 daily tomorrow.  Other meds per cardiology.  Low sodium, fluid restricted diet too. Daily wts, I/Os.   **AKI on CKD:  Has known CKD f/b [facility], stage 4 it appears as of 10/2023.  Now with AKI +/- progression in setting of CHF, HTN, cocaine use.  In the setting of diuresis Cr trending up - agree with holding this AM diuretic dose and reassess tomorrow.  Discussed and he favors conservative care should he reach need for HD.  Cont to avoid extremes of BP, nephrotoxins.  Daily labs for now.   **HTN: meds being adjusted by cardiology - BP in 130-150s currently.   **Anemia: very mild, monitor.   **Secondary hyperPTH:  cont outpt dose of calcitriol. Corr Ca ok.   **Substance abuse: EtOH and cocaine - he's aware this isn't advisable.   **DM: A1c 6.5, meds limited by GFR  Other issues per Primary   Will follow, reach out with concerns.   Tim Bradley 07/21/2024, 1:14 PM

## 2024-07-21 NOTE — Progress Notes (Signed)
 Mobility Specialist - Progress Note   07/21/24 1253  Mobility  Activity Pivoted/transferred from chair to bed  Level of Assistance +2 (takes two people)  Press photographer wheel walker  Range of Motion/Exercises Active  Activity Response Tolerated well  Mobility Referral Yes  Mobility visit 1 Mobility  Mobility Specialist Start Time (ACUTE ONLY) 1243  Mobility Specialist Stop Time (ACUTE ONLY) 1253  Mobility Specialist Time Calculation (min) (ACUTE ONLY) 10 min   Pt requested assistance back to bed. Required cues for hand/feet placement to go from sit>stand. At EOS was left sitting EOB  with all needs met. Call bell in reach and bed alarm on.   Erminio Leos,  Mobility Specialist Can be reached via Secure Chat

## 2024-07-21 NOTE — Progress Notes (Signed)
 Assuming care for patient from off-going RN. Agree w/ previously charted shift assessment and daily documentation. Will assume care until 1900.

## 2024-07-21 NOTE — Progress Notes (Signed)
 Progress Note  Patient Name: Tim Bradley Date of Encounter: 07/21/2024 [facility] Cardiologist: Maude Emmer, MD   Interval Summary   Shared his breathing was labored at times and during the interview [ noted G[place] not in place, improved with Stryker replaced to proper position for usage].  Denied chest pain, peripheral edema.  Shared that he has been getting lightheaded when short of breath.  Mentioned a left sided headache that has been on and off this admission, now sharing he LOC while on the floor and unsure if he hit his head then.  Shared he was having muscle cramps to this right leg when it is in certain positions.  Vital Signs Vitals:   07/20/24 2115 07/21/24 0500 07/21/24 0518 07/21/24 1356  BP: (!) 163/89  (!) 159/95 (!) 143/86  Pulse: 85  77 84  Resp:   15 18  Temp:   98.3 F (36.8 C) 98.8 F (37.1 C)  TempSrc:   Oral Oral  SpO2: 98%  100% 97%  Weight:  113.3 kg    Height:        Intake/Output Summary (Last 24 hours) at 07/21/2024 1419 Last data filed at 07/21/2024 1034 Gross per 24 hour  Intake 940 ml  Output 950 ml  Net -10 ml      07/21/2024    5:00 AM 07/20/2024    5:00 AM 07/19/2024    5:00 AM  Last 3 Weights  Weight (lbs) 249 lb 12.5 oz 250 lb 3.6 oz 247 lb 2.2 oz  Weight (kg) 113.3 kg 113.5 kg 112.1 kg      Telemetry/ECG  Sinus rthym with LBBB, one episode of sinus tachy with LBBB vs NSVT ~7 beats [looks more like NSVT] avg HR 85 - Personally Reviewed  Physical Exam  GEN: No acute distress.   Neck: No JVD Cardiac: RRR, no murmurs, rubs, or gallops.  Respiratory: Clear to auscultation bilaterally. GI: Soft, nontender, non-distended  MS: No edema  Assessment & Plan  Tim Bradley is a 73 y.o. 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 Patient noted to have an AKI, Pro BNP 11553 , and to be overtly volume overloaded on exam. Patient had not been taking medications at home. He was given IV diuresis, re-started on medications, and cardiology was consulted.    Acute on Chronic systolic heart failure  Acute hypoxic respiratory failure Patient reported worsening fatigue and peripheral edema. On initial intake patient had shared SOB was at baseline, though now on supplemental oxygen Patient reported medication non-compliance prior to admission. UDS + cocaine. Patient reported drinking about 7 airplane bottles of liquor.    Pro-BNP 11553  Echo this admission showed LVEF 25-30% with global hypokinesis and moderate concentric LVH. Low normal RV function. [2023: LVEF 35-40%] Exacerbation and EF reduction most likely from medication non-compliance and substance use.    Received IV diuresis. Net IO Since Admission: -2,755 mL [07/21/24 1429] Weight 266lbs -> 246lbs. Unfortunately do not know dry weight.  On exam appears euvolemic. Continues to have oxygen requirement for shortness of breath   -start PTA oral torsemide  100 mg tomorrow -continue imdur 30 mg -continue hydralazine  25 mg TID -restart coreg  3.125 mg BID as he  is now compensated, as we are trying to avoid extremes of BP will start low. -defer SGLT2i with renal function, current UTI, and wheelchair bound -will defer ARB/ACE/ARNi/MRA with renal function. If he eventually goes on HD, will revisit possible ARB initiation in the future.     AKI on CKD  Last charted Cr 3.6 10/2023 Cr today 4.56 Nephrology was consulted.  They recommend returning to home oral diuresis dose tomorrow. Patient reported when he reaches ESRD would like to defer HD. Avoid nephrotoxic drugs and extremes of BP  Hypertension BP: 143/86 Medications as above   Hyperlipidemia LDL  48   HDL 73  Unlikely the culprit of his  muscle cramps as this has been ongoing [ patient was not taking lipitor  before admission]  continue lipitor  80 mg   Hx of DVT Continue eliquis  5 mg BID  One run of NSVT Will check electrolytes Coreg  as above   Per primary Seizure disorder T2DM Possible UTI BPH/prostate cancer Polysubstance use Lumbar stenosis COPD Fall/headache   For questions or updates, please contact [facility] Please consult [email or web address] for contact info under       Signed, Leontine LOISE Salen, PA-C

## 2024-07-21 NOTE — Plan of Care (Signed)
  Problem: Clinical Measurements: Goal: Diagnostic test results will improve Outcome: Progressing   Problem: Fluid Volume: Goal: Ability to maintain a balanced intake and output will improve Outcome: Progressing   Problem: Nutritional: Goal: Maintenance of adequate nutrition will improve Outcome: Progressing Goal: Progress toward achieving an optimal weight will improve Outcome: Progressing   Problem: Tissue Perfusion: Goal: Adequacy of tissue perfusion will improve Outcome: Progressing

## 2024-07-21 NOTE — Progress Notes (Signed)
 PROGRESS NOTE    Tim Bradley  FMW:[id number] DOB: [birth year] DOA: 07/18/2024 PCP: [facility]   Brief Narrative:  Tim Bradley is a 73 y.o. male with medical history significant of left gluteal abscess, GERD, alcohol  abuse, anemia, chronic systolic heart failure, stage IV CKD, cocaine abuse, lumbar DDD, type 2 diabetes, dyspnea, BPH, hypertension, seizure disorder, lumbar spinal stenosis who presented to the emergency department with complaints of bilateral lower extremity pain and edema.  Assessment & Plan:   Principal Problem:   Volume overload Active Problems:   Acute on chronic combined systolic and diastolic CHF (congestive heart failure) (HCC)   CKD (chronic kidney disease) stage 4, GFR 15-29 ml/min (HCC)   Hypertension   Type 2 diabetes mellitus with hyperlipidemia (HCC)   BPH (benign prostatic hyperplasia)   Chronic deep vein thrombosis (DVT) (HCC)   COPD (chronic obstructive pulmonary disease) (HCC)   Seizure disorder (HCC)   Obesity (BMI 30-39.9)  Rule out UTI, POA - Patient remains asymptomatic, initially treated with ceftriaxone  prophylactically due to mental status changes but likely secondary to acute respiratory failure and AKI as below  - Urine culture reports Morganella morganii(100k)/Pseudomonas(80k) but is unclear whether or not this is a clean-catch or simply contaminant  - Will hold antibiotics and follow clinically -low threshold to restart antibiotics should patient have symptoms or fever  Acute on chronic combined systolic and diastolic CHF (congestive heart failure) (HCC) Acute hypoxic respiratory failure - Currently requiring 3 L nasal cannula to maintain sats at rest - on room air at baseline - Continue diuresis appropriate, follow labs as below - Echo notable for reduced EF at 25 to 30%, down from previous 35 to 40% - Appreciate cardiology insight recommendations - Exacerbation likely secondary to noncompliance with medication regimen as well  as with ongoing cocaine use and abuse -patient has been educated at length about his behavior as it increases his risk of morbidity and mortality   AKI on CKD (chronic kidney disease) stage 4, GFR 15-29 ml/min (HCC) - Potentially worsening baseline CKD, unclear baseline - Urine output not improving as expected, nephrology consulted for further insight and recommendations - Baseline creatinine 3.3, admitted at 4.3; downtrending somewhat initially but now climbing again   Hypertension, essential - Continue  hydralazine /isosorbide - carvedilol  on hold per cardiology   Type 2 diabetes mellitus with hyperlipidemia (HCC) - Continue sliding scale insulin , hypoglycemic protocol - A1c 6.5, moderately well-controlled   BPH (benign prostatic hyperplasia) - Continue finasteride  no urinary symptoms at this time   Chronic deep vein thrombosis (DVT) (HCC) Still present on repeat study 07/19/2024 - Continue apixaban  5 twice daily, Doppler study pending - Medication compliance discussed at length given concern over medication noncompliance as above   COPD (chronic obstructive pulmonary disease) (HCC) Without acute exacerbation, wean oxygen as above, continue home nebs supportive care, no indication for steroids or antibiotics.   Seizure disorder (HCC) Continue Keppra , Vimpat    Obesity (BMI 30-39.9) Body mass index is 32.07 kg/m.  DVT prophylaxis:  apixaban  (ELIQUIS ) tablet 5 mg  Code Status:   Code Status: Limited: Do not attempt resuscitation (DNR) -DNR-LIMITED -Do Not Intubate/DNI  Family Communication: None present  Status is: Inpatient  Dispo: The patient is from: Home              Anticipated d/c is to: Home               Anticipated d/c date is: To be determined  Patient currently not medically stable for discharge  Consultants:  Cardiology  Procedures:  None  Antimicrobials:  None indicated  Subjective: No acute issues or events overnight, denies nausea vomiting  diarrhea constipation headache fevers chills or chest pain  Objective: Vitals:   07/20/24 2057 07/20/24 2115 07/21/24 0500 07/21/24 0518  BP: (!) 182/92 (!) 163/89  (!) 159/95  Pulse: 81 85  77  Resp: 18   15  Temp: 97.9 F (36.6 C)   98.3 F (36.8 C)  TempSrc: Oral   Oral  SpO2: (!) 53% 98%  100%  Weight:   113.3 kg   Height:        Intake/Output Summary (Last 24 hours) at 07/21/2024 0755 Last data filed at 07/21/2024 0600 Gross per 24 hour  Intake 940 ml  Output 1150 ml  Net -210 ml   Filed Weights   07/19/24 0500 07/20/24 0500 07/21/24 0500  Weight: 112.1 kg 113.5 kg 113.3 kg    Examination:  General: Pleasantly resting in bed, No acute distress.  Somnolent, easily arousable but falls asleep quickly HEENT: Normocephalic atraumatic.  Sclerae nonicteric, noninjected.  Extraocular movements intact bilaterally. Neck: Without mass or deformity.  Trachea is midline. Lungs: Bilateral rales. Heart: Tachycardic without murmurs rubs or gallops. Abdomen: Soft, nontender, nondistended.  Without guarding or rebound. Extremities: Without cyanosis, clubbing, 1-2+ pitting edema bilateral lower extremities. Skin: Warm and dry, no erythema.  Data Reviewed: I have personally reviewed following labs and imaging studies  CBC: Recent Labs  Lab 07/18/24 1101 07/19/24 0414 07/20/24 0422 07/21/24 0415  WBC 7.0 5.1 5.6 6.2  NEUTROABS 5.6  --   --   --   HGB 13.2 13.0 11.8* 11.7*  HCT 41.7 41.1 40.1 38.7*  MCV 89.5 89.2 92.2 91.3  PLT 247 243 217 228   Basic Metabolic Panel: Recent Labs  Lab 07/18/24 1101 07/19/24 0414 07/20/24 0422 07/21/24 0415  NA 137 138 141 141  K 4.1 3.9 3.9 4.1  CL 100 101 107 105  CO2 22 23 21* 24  GLUCOSE 110* 117* 200* 217*  BUN 83* 82* 84* 86*  CREATININE 4.24* 4.13* 4.24* 4.56*  CALCIUM  9.8 9.8 9.1 8.9  MG  --  2.9*  --   --    GFR: Estimated Creatinine Clearance: 18.7 mL/min (A) (by C-G formula based on SCr of 4.56 mg/dL (H)). Liver  Function Tests: Recent Labs  Lab 07/18/24 1101 07/19/24 0414  AST 40 32  ALT 13 14  ALKPHOS 81 76  BILITOT 0.6 0.3  PROT 7.9 7.8  ALBUMIN 4.1 4.0   Cardiac Enzymes: Recent Labs  Lab 07/18/24 1101  CKTOTAL 730*   BNP (last 3 results) Recent Labs    07/18/24 1101  PROBNP 11,553.0*   HbA1C: Recent Labs    07/19/24 0414  HGBA1C 6.5*   CBG: Recent Labs  Lab 07/20/24 0736 07/20/24 1141 07/20/24 1648 07/20/24 2052 07/21/24 0749  GLUCAP 201* 122* 158* 137* 140*   Thyroid  Function Tests: Recent Labs    07/18/24 1101  TSH 0.609   Sepsis Labs: Recent Labs  Lab 07/18/24 1109  LATICACIDVEN 1.3   Recent Results (from the past 240 hours)  Urine Culture     Status: Abnormal (Preliminary result)   Collection Time: 07/18/24 11:27 AM   Specimen: Urine, Random  Result Value Ref Range Status   Specimen Description   Final    URINE, RANDOM Performed at [facility], [address], [place], [place] [address]  Special Requests   Final    NONE Reflexed from [id number] Performed at [facility], [address], [place], [place] [address]    Culture (A)  Final    >=100,000 COLONIES/mL MORGANELLA MORGANII 80,000 COLONIES/mL PSEUDOMONAS AERUGINOSA CULTURE REINCUBATED FOR BETTER GROWTH Performed at [facility], [address] [place], [place] [address]    Report Status PENDING  Incomplete  MRSA Next Gen by PCR, Nasal     Status: Abnormal   Collection Time: 07/19/24  2:56 PM   Specimen: Nasal Mucosa; Nasal Swab  Result Value Ref Range Status   MRSA by PCR Next Gen DETECTED (A) NOT DETECTED Final    Comment: (NOTE) The GeneXpert MRSA Assay (FDA approved for NASAL specimens only), is one component of a comprehensive MRSA colonization surveillance program. It is not intended to diagnose MRSA infection nor to guide or monitor treatment for MRSA infections. Test performance is not FDA approved in patients less than 33  years old. Performed at [facility], [address] [place], [place] [address]      Radiology Studies: DG CHEST PORT 1 VIEW Result Date: 07/20/2024 CLINICAL DATA:  Acute respiratory failure EXAM: PORTABLE CHEST 1 VIEW COMPARISON:  Chest radiograph dated 07/01/2022 FINDINGS: Normal lung volumes. No focal consolidations. No pleural effusion or pneumothorax. Similar enlarged cardiomediastinal silhouette. No acute osseous abnormality. IMPRESSION: 1.  No focal consolidations. 2. Similar cardiomegaly. Electronically Signed   By: Limin  Xu M.D.   On: 07/20/2024 15:39   VAS US  LOWER EXTREMITY VENOUS (DVT) (ONLY [facility] & [facility]) Result Date: 07/20/2024  Lower Venous DVT Study Patient Name:  Tim Bradley Lauro  Date of Exam:   07/19/2024 Medical Rec #: [id number]      Accession #:    [id number] Date of Birth: 1951/06/16      Patient Gender: M Patient Age:   58 years Exam Location:  [facility] Procedure:      VAS US  LOWER EXTREMITY VENOUS (DVT) Referring Phys: LONNI SAKAI --------------------------------------------------------------------------------  Indications: Pain, and Swelling. Other Indications: CHF - volume overload. Risk Factors: Extensive DVT in RLE (09/30/2020). Anticoagulation: Eliquis . Limitations: VERY difficult exam due to continous involuntary muscle contraction and immobility. Comparison Study: Previous exam on 12/21/2021 was positive for RLE DVT. Chronic -                   CFV, SFJ, and proximal FV. Age Indeterminate - mid & distal                   FV, and PopV. Performing Technologist: Ezzie Potters RVT, RDMS  Examination Guidelines: A complete evaluation includes B-mode imaging, spectral Doppler, color Doppler, and power Doppler as needed of all accessible portions of each vessel. Bilateral testing is considered an integral part of a complete examination. Limited examinations for reoccurring indications may be performed as noted. The reflux portion of the exam is performed  with the patient in reverse Trendelenburg.  +---------+---------------+---------+-----------+----------+-------------------+ RIGHT    CompressibilityPhasicitySpontaneityPropertiesThrombus Aging      +---------+---------------+---------+-----------+----------+-------------------+ CFV      Partial        Yes      Yes                  mid and distal -  age indeterminate   +---------+---------------+---------+-----------+----------+-------------------+ SFJ      Partial                                      Age Indeterminate   +---------+---------------+---------+-----------+----------+-------------------+ FV Prox  Partial        Yes      Yes                  Chronic             +---------+---------------+---------+-----------+----------+-------------------+ FV Mid   Partial        Yes      Yes                  Age Indeterminate   +---------+---------------+---------+-----------+----------+-------------------+ FV DistalFull           Yes      Yes                                      +---------+---------------+---------+-----------+----------+-------------------+ PFV      Full           Yes      Yes                                      +---------+---------------+---------+-----------+----------+-------------------+ POP      Full                                                             +---------+---------------+---------+-----------+----------+-------------------+ PTV                                                   Not well visualized +---------+---------------+---------+-----------+----------+-------------------+   +---------+---------------+---------+-----------+----------+--------------+ LEFT     CompressibilityPhasicitySpontaneityPropertiesThrombus Aging +---------+---------------+---------+-----------+----------+--------------+ CFV      Full           Yes      Yes                                  +---------+---------------+---------+-----------+----------+--------------+ SFJ      Full                                                        +---------+---------------+---------+-----------+----------+--------------+ FV Prox  Full           Yes      Yes                                 +---------+---------------+---------+-----------+----------+--------------+ FV Mid   Full           Yes      Yes                                 +---------+---------------+---------+-----------+----------+--------------+  FV DistalFull           Yes      Yes                                 +---------+---------------+---------+-----------+----------+--------------+ PFV      Full                                                        +---------+---------------+---------+-----------+----------+--------------+ POP      Full           Yes      Yes                                 +---------+---------------+---------+-----------+----------+--------------+ PTV      Full                                                        +---------+---------------+---------+-----------+----------+--------------+ PERO     Full                                                        +---------+---------------+---------+-----------+----------+--------------+     Summary: BILATERAL: -No evidence of popliteal cyst, bilaterally. RIGHT: - Findings consistent with age indeterminate deep vein thrombosis involving the right common femoral vein, SF junction, and right femoral vein.  - Findings consistent with chronic deep vein thrombosis involving the right femoral vein.  - No cystic structure found in the popliteal fossa.  LEFT: - No evidence of common femoral vein obstruction.  - There is no evidence of chronic venous insufficiency.  *See table(s) above for measurements and observations. Electronically signed by Lonni Gaskins MD on 07/20/2024 at 9:23:22 AM.    Final    ECHOCARDIOGRAM  COMPLETE Result Date: 07/19/2024    ECHOCARDIOGRAM REPORT   Patient Name:   Tim Bradley Date of Exam: 07/19/2024 Medical Rec #:  [id number]     Height:       74.0 in Accession #:    [id number]    Weight:       247.1 lb Date of Birth:  1951-04-14     BSA:          2.378 m Patient Age:    73 years      BP:           133/87 mmHg Patient Gender: M             HR:           75 bpm. Exam Location:  Inpatient Procedure: 2D Echo, Cardiac Doppler, Color Doppler and Intracardiac            Opacification Agent (Both Spectral and Color Flow Doppler were            utilized during procedure). Indications:    CHF-Acute Systolic I50.21  History:        Patient has  prior history of Echocardiogram examinations, most                 recent 01/26/2022. CHF; Risk Factors:Diabetes and Hypertension.  Sonographer:    Jayson Gaskins Referring Phys: [id number] DAVID MANUEL ORTIZ IMPRESSIONS  1. No left ventricular thrombus is seen (Definity  contrast was used). There is adverse spherical remodeling of the left ventricle and severe systolic dyssynchrony. Left ventricular ejection fraction, by estimation, is 25 to 30%. The left ventricle has severely decreased function. The left ventricle demonstrates global hypokinesis. The left ventricular internal cavity size was moderately to severely dilated. There is moderate concentric left ventricular hypertrophy. Indeterminate diastolic filling due to E-A fusion.  2. Right ventricular systolic function is low normal. The right ventricular size is normal.  3. The mitral valve is degenerative. Trivial mitral valve regurgitation.  4. The aortic valve is tricuspid. Aortic valve regurgitation is not visualized.  5. Aortic dilatation noted. There is mild dilatation of the ascending aorta, measuring 39 mm. Comparison(s): Prior images reviewed side by side. The left ventricular function is significantly worse. FINDINGS  Left Ventricle: No left ventricular thrombus is seen (Definity  contrast was used). There is  adverse spherical remodeling of the left ventricle and severe systolic dyssynchrony. Left ventricular ejection fraction, by estimation, is 25 to 30%. The left ventricle has severely decreased function. The left ventricle demonstrates global hypokinesis. Definity  contrast agent was given IV to delineate the left ventricular endocardial borders. The left ventricular internal cavity size was moderately to severely dilated. There is moderate concentric left ventricular hypertrophy. Abnormal (paradoxical) septal motion, consistent with left bundle branch block. Indeterminate diastolic filling due to E-A fusion. Right Ventricle: The right ventricular size is normal. No increase in right ventricular wall thickness. Right ventricular systolic function is low normal. Left Atrium: Left atrial size was normal in size. Right Atrium: Right atrial size was normal in size. Pericardium: There is no evidence of pericardial effusion. Mitral Valve: The mitral valve is degenerative in appearance. Trivial mitral valve regurgitation. Tricuspid Valve: The tricuspid valve is grossly normal. Tricuspid valve regurgitation is trivial. Aortic Valve: The aortic valve is tricuspid. Aortic valve regurgitation is not visualized. Aortic valve mean gradient measures 3.0 mmHg. Aortic valve peak gradient measures 5.7 mmHg. Aortic valve area, by VTI measures 4.18 cm. Pulmonic Valve: The pulmonic valve was grossly normal. Pulmonic valve regurgitation is trivial. No evidence of pulmonic stenosis. Aorta: Aortic dilatation noted and the aortic root is normal in size and structure. There is mild dilatation of the ascending aorta, measuring 39 mm. Venous: The inferior vena cava was not well visualized. IAS/Shunts: No atrial level shunt detected by color flow Doppler.  LEFT VENTRICLE PLAX 2D LVIDd:         6.40 cm LVIDs:         5.67 cm LV PW:         1.40 cm LV IVS:        1.60 cm LVOT diam:     2.30 cm LV SV:         72 LV SV Index:   30 LVOT Area:      4.15 cm  RIGHT VENTRICLE RV S prime:     17.10 cm/s TAPSE (M-mode): 2.9 cm LEFT ATRIUM             Index        RIGHT ATRIUM           Index LA Vol (A2C):   35.7 ml 15.01 ml/m  RA Area:     16.80 cm LA Vol (A4C):   37.6 ml 15.81 ml/m  RA Volume:   39.10 ml  16.44 ml/m LA Biplane Vol: 38.7 ml 16.27 ml/m  AORTIC VALVE AV Area (Vmax):    3.49 cm AV Area (Vmean):   3.24 cm AV Area (VTI):     4.18 cm AV Vmax:           119.00 cm/s AV Vmean:          86.600 cm/s AV VTI:            0.173 m AV Peak Grad:      5.7 mmHg AV Mean Grad:      3.0 mmHg LVOT Vmax:         99.90 cm/s LVOT Vmean:        67.500 cm/s LVOT VTI:          0.174 m LVOT/AV VTI ratio: 1.01  AORTA Ao Root diam: 3.60 cm Ao Asc diam:  3.80 cm MITRAL VALVE MV Area (PHT): 3.50 cm     SHUNTS MV Decel Time: 217 msec     Systemic VTI:  0.17 m MV E velocity: 63.00 cm/s   Systemic Diam: 2.30 cm MV A velocity: 126.00 cm/s MV E/A ratio:  0.50 Mihai Croitoru MD Electronically signed by Jerel Balding MD Signature Date/Time: 07/19/2024/4:11:12 PM    Final    Scheduled Meds:  apixaban   5 mg Oral BID   atorvastatin   80 mg Oral Daily   furosemide   80 mg Intravenous BID   hydrALAZINE   25 mg Oral Q8H   insulin  aspart  0-9 Units Subcutaneous TID WC   isosorbide  mononitrate  30 mg Oral Daily   lacosamide   100 mg Oral BID   levETIRAcetam   500 mg Oral BID   Continuous Infusions:  cefTRIAXone  (ROCEPHIN )  IV 2 g (07/20/24 2004)    LOS: 2 days   Time spent:  Elsie JAYSON Montclair, DO [facility]  If 7PM-7AM, please contact night-coverage [email or web address]  07/21/2024, 7:55 AM

## 2024-07-21 NOTE — Progress Notes (Signed)
 Mobility Specialist - Progress Note   07/21/24 1034  Mobility  Activity Pivoted/transferred from bed to chair  Level of Assistance +2 (takes two people)  Press photographer wheel walker  Range of Motion/Exercises Active  Activity Response Tolerated well  Mobility Referral Yes  Mobility visit 1 Mobility  Mobility Specialist Start Time (ACUTE ONLY) 1020  Mobility Specialist Stop Time (ACUTE ONLY) 1034  Mobility Specialist Time Calculation (min) (ACUTE ONLY) 14 min   Pt was found in bed requesting to mobilize. Pt declined use of Stedy and stated wanting to use RW. NT in room during session for safety. At EOS was left on recliner chair with all needs met. Call bell in reach.   Erminio Leos,  Mobility Specialist Can be reached via Secure Chat

## 2024-07-22 ENCOUNTER — Other Ambulatory Visit (HOSPITAL_COMMUNITY): Payer: Self-pay

## 2024-07-22 DIAGNOSIS — I5043 Acute on chronic combined systolic (congestive) and diastolic (congestive) heart failure: Secondary | ICD-10-CM | POA: Diagnosis not present

## 2024-07-22 DIAGNOSIS — E1169 Type 2 diabetes mellitus with other specified complication: Secondary | ICD-10-CM | POA: Diagnosis not present

## 2024-07-22 DIAGNOSIS — N184 Chronic kidney disease, stage 4 (severe): Secondary | ICD-10-CM | POA: Diagnosis not present

## 2024-07-22 DIAGNOSIS — I10 Essential (primary) hypertension: Secondary | ICD-10-CM | POA: Diagnosis not present

## 2024-07-22 LAB — BASIC METABOLIC PANEL WITH GFR
Anion gap: 11 (ref 5–15)
BUN: 86 mg/dL — ABNORMAL HIGH (ref 8–23)
CO2: 27 mmol/L (ref 22–32)
Calcium: 9.2 mg/dL (ref 8.9–10.3)
Chloride: 103 mmol/L (ref 98–111)
Creatinine, Ser: 5.08 mg/dL — ABNORMAL HIGH (ref 0.61–1.24)
GFR, Estimated: 11 mL/min — ABNORMAL LOW (ref >60)
Glucose, Bld: 162 mg/dL — ABNORMAL HIGH (ref 70–99)
Potassium: 4.3 mmol/L (ref 3.5–5.1)
Sodium: 140 mmol/L (ref 135–145)

## 2024-07-22 LAB — CBC
HCT: 37.6 % — ABNORMAL LOW (ref 39.0–52.0)
Hemoglobin: 11.6 g/dL — ABNORMAL LOW (ref 13.0–17.0)
MCH: 28.4 pg (ref 26.0–34.0)
MCHC: 30.9 g/dL (ref 30.0–36.0)
MCV: 92.2 fL (ref 80.0–100.0)
Platelets: 224 K/uL (ref 150–400)
RBC: 4.08 MIL/uL — ABNORMAL LOW (ref 4.22–5.81)
RDW: 13.8 % (ref 11.5–15.5)
WBC: 6 K/uL (ref 4.0–10.5)
nRBC: 0 % (ref 0.0–0.2)

## 2024-07-22 LAB — URINE CULTURE: Culture: >=100000 — AB

## 2024-07-22 LAB — GLUCOSE, CAPILLARY
Glucose-Capillary: 176 mg/dL — ABNORMAL HIGH (ref 70–99)
Glucose-Capillary: 178 mg/dL — ABNORMAL HIGH (ref 70–99)
Glucose-Capillary: 159 mg/dL — ABNORMAL HIGH (ref 70–99)

## 2024-07-22 MED ORDER — HYDRALAZINE HCL 25 MG PO TABS
25.0000 mg | ORAL_TABLET | Freq: Three times a day (TID) | ORAL | 0 refills | Status: AC
  Filled 2024-07-22: qty 90, 30d supply, fill #0

## 2024-07-22 MED ORDER — OXYCODONE HCL 5 MG PO TABS: 5.0000 mg | ORAL_TABLET | ORAL | 0 refills | Status: AC

## 2024-07-22 MED ORDER — CARVEDILOL 3.125 MG PO TABS
3.1250 mg | ORAL_TABLET | Freq: Two times a day (BID) | ORAL | 0 refills | Status: AC
  Filled 2024-07-22: qty 60, 30d supply, fill #0

## 2024-07-22 MED ORDER — LEVETIRACETAM 500 MG PO TABS
500.0000 mg | ORAL_TABLET | Freq: Two times a day (BID) | ORAL | 0 refills | Status: AC
  Filled 2024-07-22: qty 60, 30d supply, fill #0

## 2024-07-22 MED ORDER — ISOSORBIDE MONONITRATE ER 30 MG PO TB24
30.0000 mg | ORAL_TABLET | Freq: Every day | ORAL | 0 refills | Status: AC
  Filled 2024-07-22: qty 30, 30d supply, fill #0

## 2024-07-22 NOTE — NC FL2 (Signed)
 [place]  MEDICAID FL2 LEVEL OF CARE FORM     IDENTIFICATION  Patient Name: Tim Bradley Birthdate: September 20, 1951 Sex: male Admission Date (Current Location): 07/18/2024  [facility] and [place] Number:  Producer, television/film/video and Address:  [facility],  [address] [place], [place] [address]      Provider Number: [id number]  Attending Physician Name and Address:  Lue Elsie BROCKS, MD  Relative Name and Phone Number:  Katherleen Eden (Sister)  [phone] [facility])    Current Level of Care: Hospital Recommended Level of Care: Nursing Facility Prior Approval Number:    Date Approved/Denied:   PASRR Number: [id number]  Discharge Plan: Other (Comment) (LTC)    Current Diagnoses: Patient Active Problem List   Diagnosis Date Noted   Volume overload 07/18/2024   Class 2 obesity 05/22/2022   UTI (urinary tract infection) 05/18/2022   Acute on chronic combined systolic and diastolic CHF (congestive heart failure) (HCC) 05/17/2022   Diabetes mellitus without complication (HCC)    History of prostate cancer 04/25/2022   Obesity (BMI 30-39.9) 04/25/2022   Recurrent seizures (HCC) 02/20/2022   E coli bacteremia 01/29/2022   Situational anxiety 01/26/2022   Physical deconditioning 01/26/2022   Sepsis secondary to UTI (HCC) 01/24/2022   Acute cystitis 01/24/2022   Class 1 obesity 12/22/2021   DNR (do not resuscitate) 12/21/2021   DVT (deep venous thrombosis) (HCC) 12/21/2021   Substance induced mood disorder (HCC) 10/27/2020   Right leg DVT (HCC) 09/30/2020   Seizure disorder (HCC) 09/29/2020   Chronic systolic CHF (congestive heart failure) (HCC) 09/28/2020   AKI (acute kidney injury) 09/28/2020   Pressure injury of skin 05/03/2020   Acute encephalopathy 05/02/2020   Diarrhea 05/02/2020   Acute on chronic systolic CHF (congestive heart failure) (HCC) 04/22/2020   Hypertension 04/11/2020   Leg edema 04/11/2020   Dyspnea 04/11/2020   Acute on chronic congestive heart  failure (HCC) 04/11/2020   BPH (benign prostatic hyperplasia) 04/11/2020   Generalized weakness 04/11/2020   CKD (chronic kidney disease) stage 4, GFR 15-29 ml/min (HCC) 04/11/2020   Chronic deep vein thrombosis (DVT) (HCC) 04/11/2020   COPD (chronic obstructive pulmonary disease) (HCC) 04/11/2020   Seizures (HCC) 04/11/2020   Seizure (HCC) 07/21/2019   Cocaine abuse (HCC)    Gastritis 08/20/2018   Heme positive stool    Acute blood loss anemia secondary to massive gastric ulcer    Hyponatremia    Abscess, gluteal, left 03/26/2017   Type 2 diabetes mellitus with hyperlipidemia (HCC) 03/26/2017   Adrenal nodule 03/26/2017   Closed fracture of body of thoracic vertebra (HCC) 03/26/2017   Spinal stenosis of lumbar region 03/26/2017    Orientation RESPIRATION BLADDER Height & Weight     Self, Time, Situation, Place  Normal Incontinent Weight: 246 lb 11.1 oz (111.9 kg) Height:  6' 2 (188 cm)  BEHAVIORAL SYMPTOMS/MOOD NEUROLOGICAL BOWEL NUTRITION STATUS      Incontinent Diet (renal/carb)  AMBULATORY STATUS COMMUNICATION OF NEEDS Skin   Total Care Verbally Normal                       Personal Care Assistance Level of Assistance  Bathing, Dressing, Feeding Bathing Assistance: Limited assistance Feeding assistance: Independent Dressing Assistance: Limited assistance     Functional Limitations Info  Sight, Hearing, Speech Sight Info: Adequate Hearing Info: Adequate Speech Info: Adequate    SPECIAL CARE FACTORS FREQUENCY  PT (By licensed PT), OT (By licensed OT)     PT Frequency:  5 x a week OT Frequency: 5 x a week            Contractures Contractures Info: Not present    Additional Factors Info  Code Status, Allergies Code Status Info: DNR Allergies Info: Gabapentin   Tramadol   Trazodone  And Nefazodone           Current Medications (07/22/2024):  This is the current hospital active medication list Current Facility-Administered Medications  Medication  Dose Route Frequency Provider Last Rate Last Admin   acetaminophen  (TYLENOL ) tablet 650 mg  650 mg Oral Q6H PRN Celinda Alm Lot, MD   650 mg at 07/22/24 1032   Or   acetaminophen  (TYLENOL ) suppository 650 mg  650 mg Rectal Q6H PRN Celinda Alm Lot, MD       apixaban  (ELIQUIS ) tablet 5 mg  5 mg Oral BID Celinda Alm Lot, MD   5 mg at 07/22/24 1032   atorvastatin  (LIPITOR ) tablet 80 mg  80 mg Oral Daily Garrick Leontine SAILOR, PA-C   80 mg at 07/22/24 1032   calcitRIOL (ROCALTROL) capsule 0.25 mcg  0.25 mcg Oral Q M,W,F Norine Manuelita LABOR, MD   0.25 mcg at 07/22/24 1032   carvedilol  (COREG ) tablet 3.125 mg  3.125 mg Oral BID WC Garrick Leontine SAILOR, PA-C   3.125 mg at 07/22/24 1032   Chlorhexidine  Gluconate Cloth 2 % PADS 6 each  6 each Topical Daily Lue Elsie BROCKS, MD   6 each at 07/22/24 1033   finasteride  (PROSCAR ) tablet 5 mg  5 mg Oral Daily Lue Elsie BROCKS, MD   5 mg at 07/22/24 1033   hydrALAZINE  (APRESOLINE ) injection 20 mg  20 mg Intravenous Q4H PRN Celinda Alm Lot, MD   20 mg at 07/18/24 1858   hydrALAZINE  (APRESOLINE ) tablet 25 mg  25 mg Oral Q8H Garrick Leontine SAILOR, PA-C   25 mg at 07/22/24 0646   insulin  aspart (novoLOG ) injection 0-9 Units  0-9 Units Subcutaneous TID WC Celinda Alm Lot, MD   2 Units at 07/22/24 1032   isosorbide  mononitrate (IMDUR) 24 hr tablet 30 mg  30 mg Oral Daily Garrick Leontine SAILOR, PA-C   30 mg at 07/22/24 1032   lacosamide  (VIMPAT ) tablet 100 mg  100 mg Oral BID Celinda Alm Lot, MD   100 mg at 07/22/24 1032   levETIRAcetam  (KEPPRA ) tablet 500 mg  500 mg Oral BID Lue Elsie BROCKS, MD   500 mg at 07/22/24 1033   mupirocin  ointment (BACTROBAN ) 2 % 1 Application  1 Application Nasal BID Lue Elsie BROCKS, MD   1 Application at 07/22/24 1032   ondansetron  (ZOFRAN ) tablet 4 mg  4 mg Oral Q6H PRN Celinda Alm Lot, MD       Or   ondansetron  (ZOFRAN ) injection 4 mg  4 mg Intravenous Q6H PRN Celinda Alm Lot, MD       polyethylene glycol  (MIRALAX  / GLYCOLAX ) packet 17 g  17 g Oral Daily PRN Celinda Alm Lot, MD         Discharge Medications: Please see discharge summary for a list of discharge medications.  Relevant Imaging Results:  Relevant Lab Results:   Additional Information SSN:[id number]  Tawni HERO Ople Girgis, LCSW

## 2024-07-22 NOTE — Progress Notes (Signed)
 AVS given to patient and explained over the phone with receiving nurse at [facility]. Medications and follow up appointments have been explained with pt and the pt's nurse verbalizing understanding.

## 2024-07-22 NOTE — TOC Progression Note (Addendum)
 Transition of Care [facility]) - Progression Note    Patient Details  Name: Tim Bradley MRN: [id number] Date of Birth: 07/05/1951  [facility] CM/SW Contact  Tim CHRISTELLA Bradley, Tim Bradley Phone Number: 07/22/2024, 10:00am  Clinical Narrative:     CSW spoke with Tim Bradley, SW at [name], who initially stated that the pt could return to the facility on Monday if discharge occurred over the weekend.  CSW spoke with the MD, who stated the pt could discharge today.  CSW attempted to contact [facility] several times to inform her of the discharge, but was unsuccessful. A text message was also sent.  12:30 PM: CSW received a call from [facility] stating that the pt is not able to return to the facility and suggested CSW speak with the DON. She provided contact information for [facility] [phone]).  CSW attempted to contact Page, DON at [name], but there was no answer. A voicemail was left requesting a return call.  1:00 PM: CSW spoke with the pt's sister, Tim Bradley, who reported she is the pt's legal guardian. CSW requested legal guardianship paperwork to place in the chart. CSW provided an update and informed her that the pt is ready for discharge, but the facility is not willing to accept the pt back. The pt's sister stated the facility did not issue a discharge notice and she was unaware the pt could not return. She stated she will fax over the legal guardianship paperwork and will attempt to contact the facility to follow up regarding the situation. Care management to follow.   1:40pm CSW contact the [place] transfer coordinator April to inform her about pt's situation. She has referred CSW to speak with Tim Croak RN [phone]) with [facility], no answer Left VM requesting a return call. Care management to follow.    Expected Discharge Plan: Long Term Nursing Home Barriers to Discharge: Barriers Resolved               Expected Discharge Plan and  Services         Expected Discharge Date: 07/22/24                                     Social Drivers of Health (SDOH) Interventions SDOH Screenings   Food Insecurity: No Food Insecurity (07/18/2024)  Housing: Low Risk  (07/18/2024)  Transportation Needs: No Transportation Needs (07/18/2024)  Utilities: Not At Risk (07/18/2024)  Alcohol  Screen: Low Risk  (05/12/2022)  Financial Resource Strain: Low Risk  (11/08/2021)   Received from [facility]  Physical Activity: Inactive (05/06/2018)  Social Connections: Moderately Integrated (07/18/2024)  Stress: Stress Concern Present (11/08/2021)   Received from [facility]  Tobacco Use: Medium Risk (07/18/2024)    Readmission Risk Interventions     No data to display

## 2024-07-22 NOTE — TOC Transition Note (Signed)
 Transition of Care [facility]) - Discharge Note   Patient Details  Name: Tim Bradley MRN: [id number] Date of Birth: 1951/08/28  [facility] CM/SW Contact:  Tawni CHRISTELLA Eva, LCSW Phone Number: 07/22/2024, 3:02 PM   Clinical Narrative:     CSW received a call from the DON at [facility], who reported speaking with the pt and addressing their concerns. The DON stated that the pt has been placed on a behavioral contract and will be allowed to return to the facility.  CSW inquired whether the facility had spoken with the pt's sister, who was believed to be his legal guardian. The DON clarified that the pt's sister is not the legal guardian and that the pt is his own guardian.  At this time, CSW has not received any legal guardianship paperwork from the pt's sister, and several hours have passed since the request. [facility] was contacted for the pt's return transport to [facility]. Care Management signing off.  Final next level of care: Long Term Nursing Home Barriers to Discharge: Barriers Resolved   Patient Goals and CMS Choice Patient states their goals for this hospitalization and ongoing recovery are:: retrun to LTC facility          Discharge Placement                    Patient and family notified of of transfer: 07/22/24  Discharge Plan and Services Additional resources added to the After Visit Summary for                                       Social Drivers of Health (SDOH) Interventions SDOH Screenings   Food Insecurity: No Food Insecurity (07/18/2024)  Housing: Low Risk  (07/18/2024)  Transportation Needs: No Transportation Needs (07/18/2024)  Utilities: Not At Risk (07/18/2024)  Alcohol  Screen: Low Risk  (05/12/2022)  Financial Resource Strain: Low Risk  (11/08/2021)   Received from [facility]  Physical Activity: Inactive (05/06/2018)  Social Connections: Moderately Integrated (07/18/2024)  Stress: Stress Concern Present (11/08/2021)    Received from [facility]  Tobacco Use: Medium Risk (07/18/2024)     Readmission Risk Interventions     No data to display

## 2024-07-22 NOTE — Discharge Summary (Signed)
 Physician Discharge Summary  ARTEMUS ROMANOFF FMW:[id number] DOB: December 26, 1950 DOA: 07/18/2024  PCP: [facility]  Admit date: 07/18/2024 Discharge date: 07/22/2024  Admitted From: Facility Disposition: Same  Recommendations for Outpatient Follow-up:  Follow up with PCP in 1-2 weeks Follow-up with nephrologist at [facility] in 1 to 2 weeks for further discussion of likely progressing renal disease  Discharge Condition: Stable CODE STATUS: DNR Diet recommendation: Low-carb low-salt low-fat diet, fluid restricted to 1200 cc/day  Brief/Interim Summary: JAKHI DISHMAN is a 73 y.o. male with medical history significant of left gluteal abscess, GERD, alcohol  abuse, anemia, chronic systolic heart failure, stage IV CKD, cocaine abuse, lumbar DDD, type 2 diabetes, dyspnea, BPH, hypertension, seizure disorder, lumbar spinal stenosis who presented to the emergency department with complaints of bilateral lower extremity pain and edema.  Patient presents to our facility with worsening bilateral lower extremity pain edema with concerns over worsening heart failure exacerbation.  Patient notably positive for cocaine again on UDS, lengthy discussion at bedside against further usage given his advanced heart failure and renal disease.  Regardless patient was seen by cardiology and nephrology, tolerated diuresis quite well, renal function appears to be stable but advancing from prior.  Recommend close follow-up outpatient with PCP, cardiology and nephrology.  Again discussed at length need for cessation of illicit substances most notably cocaine.  Continue medication regimen as below, if patient continues to be borderline hypertensive could increase either hydralazine  or carvedilol  depending on patient's heart rate.  *Of note patient had abnormal UA and culture while here, this is likely not a UTI given this was not a clean-catch sample.  No antibiotics indicated at this time.  Discharge Diagnoses:   Principal Problem:   Volume overload Active Problems:   Acute on chronic combined systolic and diastolic CHF (congestive heart failure) (HCC)   CKD (chronic kidney disease) stage 4, GFR 15-29 ml/min (HCC)   Hypertension   Type 2 diabetes mellitus with hyperlipidemia (HCC)   BPH (benign prostatic hyperplasia)   Chronic deep vein thrombosis (DVT) (HCC)   COPD (chronic obstructive pulmonary disease) (HCC)   Seizure disorder (HCC)   Obesity (BMI 30-39.9)  Acute on chronic combined systolic and diastolic CHF (congestive heart failure) (HCC) Acute hypoxic respiratory failure - Currently on room air, baseline, without dyspnea or hypoxia - Resume home diuretics as below - Echo notable for reduced EF at 25 to 30%, down from previous 35 to 40% - Continue regimen as below, okay to adjust hydralazine  or carvedilol  if ongoing hypertension in the next few days to week. - Exacerbation likely secondary to noncompliance with medication regimen as well as with ongoing cocaine use and abuse -patient has been educated at length about his behavior as it increases his risk of morbidity and mortality   AKI on CKD (chronic kidney disease) stage 4, GFR 15-29 ml/min (HCC) - Potentially worsening baseline CKD4 - Urine output stabilizing, creatinine continues to remain elevated but stable   UTI ruled out, POA - Patient remains asymptomatic, initially treated with ceftriaxone  prophylactically due to mental status changes but likely secondary to acute respiratory failure and AKI/uremia as below  - Urine culture reports Morganella morganii(100k)/Pseudomonas(80k) but the sample is not a clean-catch upon further analysis   Altered mental status, transient, resolved  - Likely secondary to above hypoxia/uremia -now back to baseline  Hypertension, essential - Continue carvedilol , hydralazine , isosorbide  per cardiology   Type 2 diabetes mellitus with hyperlipidemia (HCC) - Resume prior regimen - A1c 6.5,  moderately well-controlled  BPH (benign prostatic hyperplasia) - Continue finasteride  no urinary symptoms at this time   Chronic deep vein thrombosis (DVT) (HCC) Still present on repeat study 07/19/2024 - Continue apixaban  5 twice daily, Doppler study pending - Medication compliance discussed at length given concern over medication noncompliance as above   COPD (chronic obstructive pulmonary disease) (HCC) Without acute exacerbation, wean oxygen as above, continue home nebs supportive care, no indication for steroids or antibiotics.   Seizure disorder (HCC) Continue Keppra , Vimpat    Obesity (BMI 30-39.9) Body mass index is 32.07 kg/m.  Discharge Instructions  Discharge Instructions     Call MD for:  difficulty breathing, headache or visual disturbances   Complete by: As directed    Call MD for:  extreme fatigue   Complete by: As directed    Call MD for:  hives   Complete by: As directed    Call MD for:  persistant dizziness or light-headedness   Complete by: As directed    Call MD for:  persistant nausea and vomiting   Complete by: As directed    Call MD for:  severe uncontrolled pain   Complete by: As directed    Call MD for:  temperature >100.4   Complete by: As directed    Diet - low sodium heart healthy   Complete by: As directed    Diet renal with fluid restriction   Complete by: As directed    1200cc per day   Increase activity slowly   Complete by: As directed       Allergies as of 07/22/2024       Reactions   Gabapentin  Other (See Comments)   Allergic, per [name]   Tramadol  Other (See Comments)   Allergic, per [name]   Trazodone  And Nefazodone Other (See Comments)   Family reports seizures from this medication - Allergic, per [name]        Medication List     STOP taking these medications    BiDil  20-37.5 MG tablet Generic drug: isosorbide -hydrALAZINE    HYDROcodone -acetaminophen  5-325 MG tablet Commonly known as:  NORCO/VICODIN   mometasone -formoterol  200-5 MCG/ACT Aero Commonly known as: DULERA        TAKE these medications    acetaminophen  500 MG tablet Commonly known as: TYLENOL  Take 1,000 mg by mouth 3 (three) times daily as needed for mild pain.   apixaban  5 MG Tabs tablet Commonly known as: ELIQUIS  Take 1 tablet (5 mg total) by mouth 2 (two) times daily. What changed:  when to take this additional instructions   Artificial Tears 0.1-0.3 % Soln Generic drug: Dextran 70-Hypromellose Place 1 drop into both eyes 2 (two) times daily as needed (for dryness).   atorvastatin  80 MG tablet Commonly known as: LIPITOR  Take 1 tablet (80 mg total) by mouth daily. What changed: when to take this   bisacodyl  5 MG EC tablet Commonly known as: DULCOLAX Take 5 mg by mouth in the morning and at bedtime.   calcitRIOL 0.25 MCG capsule Commonly known as: ROCALTROL Take 0.25 mcg by mouth every Monday, Wednesday, and Friday.   calcium  acetate 667 MG capsule Commonly known as: PHOSLO  Take 667 mg by mouth 3 (three) times daily with meals.   carvedilol  3.125 MG tablet Commonly known as: COREG  Take 1 tablet (3.125 mg total) by mouth 2 (two) times daily with a meal. What changed:  medication strength how much to take when to take this   DULoxetine  60 MG capsule Commonly known as: CYMBALTA  Take 60 mg by mouth  2 (two) times daily.   esomeprazole 40 MG capsule Commonly known as: NEXIUM Take 40 mg by mouth daily at 8 pm.   ferrous sulfate 325 (65 FE) MG tablet Take 325 mg by mouth See admin instructions. Take 325 mg by mouth with breakfast three times a week   finasteride  5 MG tablet Commonly known as: PROSCAR  Take 1 tablet (5 mg total) by mouth daily. What changed: when to take this   hydrALAZINE  25 MG tablet Commonly known as: APRESOLINE  Take 1 tablet (25 mg total) by mouth every 8 (eight) hours.   ipratropium-albuterol  0.5-2.5 (3) MG/3ML Soln Commonly known as: DUONEB Take 3 mLs  by nebulization every 4 (four) hours as needed (shortness of breath/wheezing).   isosorbide  mononitrate 30 MG 24 hr tablet Commonly known as: IMDUR Take 1 tablet (30 mg total) by mouth daily. Start taking on: July 23, 2024   Lacosamide  100 MG Tabs Take 100 mg by mouth See admin instructions. Take 100 mg by mouth at 10 AM and 6 PM   levETIRAcetam  500 MG tablet Commonly known as: KEPPRA  Take 1 tablet (500 mg total) by mouth 2 (two) times daily. What changed:  medication strength how much to take   Lidocaine -Menthol  4-1 % Gel Apply 1 application  topically See admin instructions. Apply to buttocks 2 times a day as needed for pain   Magnesium  Oxide 420 MG Tabs Take 420 mg by mouth daily.   naloxone 4 MG/0.1ML Liqd nasal spray kit Commonly known as: NARCAN Place 1 spray into the nose as needed (opioid overdose).   oxyCODONE  5 MG immediate release tablet Commonly known as: Oxy IR/ROXICODONE  Take 1 tablet (5 mg total) by mouth See admin instructions. Take 5 mg by mouth at bedtime AND an additional 5 mg two times a day as needed for pain   OXYGEN Inhale 2 L/min into the lungs as needed (for shortness of breath).   polyethylene glycol 17 g packet Commonly known as: MIRALAX  / GLYCOLAX  Take 17 g by mouth daily as needed for mild constipation. What changed: reasons to take this   pyrithione zinc  1 % shampoo Commonly known as: HEAD AND SHOULDERS Apply 1 application  topically See admin instructions. Shampoo scalp 2 times a week   sennosides-docusate sodium  8.6-50 MG tablet Commonly known as: SENOKOT-S Take 1 tablet by mouth in the morning and at bedtime.   thiamine  100 MG tablet Commonly known as: VITAMIN B1 Take 100 mg by mouth in the morning.   torsemide  100 MG tablet Commonly known as: DEMADEX  Take 1 tablet (100 mg total) by mouth daily.   Vitamin D3 Super Strength 50 MCG (2000 UT) Caps Generic drug: Cholecalciferol  Take 2,000 Units by mouth in the morning.         Follow-up Information     West, Katlyn D, NP Follow up on 08/12/2024.   Specialty: Cardiology Why: 3:35PM. Cardiology follow up Contact information: [address] [place] [address] [phone]                Allergies  Allergen Reactions   Gabapentin  Other (See Comments)    Allergic, per [name]   Tramadol  Other (See Comments)    Allergic, per [name]   Trazodone  And Nefazodone Other (See Comments)    Family reports seizures from this medication - Allergic, per [name]    Consultations: Cardiology, nephrology  Procedures/Studies: US  EKG SITE RITE Result Date: 07/21/2024 If Site Rite image not attached, placement could not be confirmed due to current  cardiac rhythm.  DG CHEST PORT 1 VIEW Result Date: 07/20/2024 CLINICAL DATA:  Acute respiratory failure EXAM: PORTABLE CHEST 1 VIEW COMPARISON:  Chest radiograph dated 07/01/2022 FINDINGS: Normal lung volumes. No focal consolidations. No pleural effusion or pneumothorax. Similar enlarged cardiomediastinal silhouette. No acute osseous abnormality. IMPRESSION: 1.  No focal consolidations. 2. Similar cardiomegaly. Electronically Signed   By: Limin  Xu M.D.   On: 07/20/2024 15:39   VAS US  LOWER EXTREMITY VENOUS (DVT) (ONLY [facility] & [facility]) Result Date: 07/20/2024  Lower Venous DVT Study Patient Name:  CHANDLOR NOECKER Iglesia  Date of Exam:   07/19/2024 Medical Rec #: [id number]      Accession #:    [id number] Date of Birth: 02/25/1951      Patient Gender: M Patient Age:   23 years Exam Location:  [facility] Procedure:      VAS US  LOWER EXTREMITY VENOUS (DVT) Referring Phys: LONNI SAKAI --------------------------------------------------------------------------------  Indications: Pain, and Swelling. Other Indications: CHF - volume overload. Risk Factors: Extensive DVT in RLE (09/30/2020). Anticoagulation: Eliquis . Limitations: VERY difficult exam due to continous involuntary muscle contraction and immobility.  Comparison Study: Previous exam on 12/21/2021 was positive for RLE DVT. Chronic -                   CFV, SFJ, and proximal FV. Age Indeterminate - mid & distal                   FV, and PopV. Performing Technologist: Ezzie Potters RVT, RDMS  Examination Guidelines: A complete evaluation includes B-mode imaging, spectral Doppler, color Doppler, and power Doppler as needed of all accessible portions of each vessel. Bilateral testing is considered an integral part of a complete examination. Limited examinations for reoccurring indications may be performed as noted. The reflux portion of the exam is performed with the patient in reverse Trendelenburg.  +---------+---------------+---------+-----------+----------+-------------------+ RIGHT    CompressibilityPhasicitySpontaneityPropertiesThrombus Aging      +---------+---------------+---------+-----------+----------+-------------------+ CFV      Partial        Yes      Yes                  mid and distal -                                                          age indeterminate   +---------+---------------+---------+-----------+----------+-------------------+ SFJ      Partial                                      Age Indeterminate   +---------+---------------+---------+-----------+----------+-------------------+ FV Prox  Partial        Yes      Yes                  Chronic             +---------+---------------+---------+-----------+----------+-------------------+ FV Mid   Partial        Yes      Yes                  Age Indeterminate   +---------+---------------+---------+-----------+----------+-------------------+ FV DistalFull           Yes      Yes                                      +---------+---------------+---------+-----------+----------+-------------------+  PFV      Full           Yes      Yes                                      +---------+---------------+---------+-----------+----------+-------------------+  POP      Full                                                             +---------+---------------+---------+-----------+----------+-------------------+ PTV                                                   Not well visualized +---------+---------------+---------+-----------+----------+-------------------+   +---------+---------------+---------+-----------+----------+--------------+ LEFT     CompressibilityPhasicitySpontaneityPropertiesThrombus Aging +---------+---------------+---------+-----------+----------+--------------+ CFV      Full           Yes      Yes                                 +---------+---------------+---------+-----------+----------+--------------+ SFJ      Full                                                        +---------+---------------+---------+-----------+----------+--------------+ FV Prox  Full           Yes      Yes                                 +---------+---------------+---------+-----------+----------+--------------+ FV Mid   Full           Yes      Yes                                 +---------+---------------+---------+-----------+----------+--------------+ FV DistalFull           Yes      Yes                                 +---------+---------------+---------+-----------+----------+--------------+ PFV      Full                                                        +---------+---------------+---------+-----------+----------+--------------+ POP      Full           Yes      Yes                                 +---------+---------------+---------+-----------+----------+--------------+ PTV  Full                                                        +---------+---------------+---------+-----------+----------+--------------+ PERO     Full                                                        +---------+---------------+---------+-----------+----------+--------------+     Summary: BILATERAL: -No  evidence of popliteal cyst, bilaterally. RIGHT: - Findings consistent with age indeterminate deep vein thrombosis involving the right common femoral vein, SF junction, and right femoral vein.  - Findings consistent with chronic deep vein thrombosis involving the right femoral vein.  - No cystic structure found in the popliteal fossa.  LEFT: - No evidence of common femoral vein obstruction.  - There is no evidence of chronic venous insufficiency.  *See table(s) above for measurements and observations. Electronically signed by Lonni Gaskins MD on 07/20/2024 at 9:23:22 AM.    Final    ECHOCARDIOGRAM COMPLETE Result Date: 07/19/2024    ECHOCARDIOGRAM REPORT   Patient Name:   GURJIT LOCONTE Bohlen Date of Exam: 07/19/2024 Medical Rec #:  [id number]     Height:       74.0 in Accession #:    [id number]    Weight:       247.1 lb Date of Birth:  [birth year]     BSA:          2.378 m Patient Age:    73 years      BP:           133/87 mmHg Patient Gender: M             HR:           75 bpm. Exam Location:  Inpatient Procedure: 2D Echo, Cardiac Doppler, Color Doppler and Intracardiac            Opacification Agent (Both Spectral and Color Flow Doppler were            utilized during procedure). Indications:    CHF-Acute Systolic I50.21  History:        Patient has prior history of Echocardiogram examinations, most                 recent 01/26/2022. CHF; Risk Factors:Diabetes and Hypertension.  Sonographer:    Jayson Gaskins Referring Phys: [id number] DAVID MANUEL ORTIZ IMPRESSIONS  1. No left ventricular thrombus is seen (Definity  contrast was used). There is adverse spherical remodeling of the left ventricle and severe systolic dyssynchrony. Left ventricular ejection fraction, by estimation, is 25 to 30%. The left ventricle has severely decreased function. The left ventricle demonstrates global hypokinesis. The left ventricular internal cavity size was moderately to severely dilated. There is moderate concentric left ventricular  hypertrophy. Indeterminate diastolic filling due to E-A fusion.  2. Right ventricular systolic function is low normal. The right ventricular size is normal.  3. The mitral valve is degenerative. Trivial mitral valve regurgitation.  4. The aortic valve is tricuspid. Aortic valve regurgitation is not visualized.  5. Aortic dilatation noted. There is mild dilatation of the ascending aorta, measuring 39 mm. Comparison(s): Prior images reviewed side by  side. The left ventricular function is significantly worse. FINDINGS  Left Ventricle: No left ventricular thrombus is seen (Definity  contrast was used). There is adverse spherical remodeling of the left ventricle and severe systolic dyssynchrony. Left ventricular ejection fraction, by estimation, is 25 to 30%. The left ventricle has severely decreased function. The left ventricle demonstrates global hypokinesis. Definity  contrast agent was given IV to delineate the left ventricular endocardial borders. The left ventricular internal cavity size was moderately to severely dilated. There is moderate concentric left ventricular hypertrophy. Abnormal (paradoxical) septal motion, consistent with left bundle branch block. Indeterminate diastolic filling due to E-A fusion. Right Ventricle: The right ventricular size is normal. No increase in right ventricular wall thickness. Right ventricular systolic function is low normal. Left Atrium: Left atrial size was normal in size. Right Atrium: Right atrial size was normal in size. Pericardium: There is no evidence of pericardial effusion. Mitral Valve: The mitral valve is degenerative in appearance. Trivial mitral valve regurgitation. Tricuspid Valve: The tricuspid valve is grossly normal. Tricuspid valve regurgitation is trivial. Aortic Valve: The aortic valve is tricuspid. Aortic valve regurgitation is not visualized. Aortic valve mean gradient measures 3.0 mmHg. Aortic valve peak gradient measures 5.7 mmHg. Aortic valve area, by VTI  measures 4.18 cm. Pulmonic Valve: The pulmonic valve was grossly normal. Pulmonic valve regurgitation is trivial. No evidence of pulmonic stenosis. Aorta: Aortic dilatation noted and the aortic root is normal in size and structure. There is mild dilatation of the ascending aorta, measuring 39 mm. Venous: The inferior vena cava was not well visualized. IAS/Shunts: No atrial level shunt detected by color flow Doppler.  LEFT VENTRICLE PLAX 2D LVIDd:         6.40 cm LVIDs:         5.67 cm LV PW:         1.40 cm LV IVS:        1.60 cm LVOT diam:     2.30 cm LV SV:         72 LV SV Index:   30 LVOT Area:     4.15 cm  RIGHT VENTRICLE RV S prime:     17.10 cm/s TAPSE (M-mode): 2.9 cm LEFT ATRIUM             Index        RIGHT ATRIUM           Index LA Vol (A2C):   35.7 ml 15.01 ml/m  RA Area:     16.80 cm LA Vol (A4C):   37.6 ml 15.81 ml/m  RA Volume:   39.10 ml  16.44 ml/m LA Biplane Vol: 38.7 ml 16.27 ml/m  AORTIC VALVE AV Area (Vmax):    3.49 cm AV Area (Vmean):   3.24 cm AV Area (VTI):     4.18 cm AV Vmax:           119.00 cm/s AV Vmean:          86.600 cm/s AV VTI:            0.173 m AV Peak Grad:      5.7 mmHg AV Mean Grad:      3.0 mmHg LVOT Vmax:         99.90 cm/s LVOT Vmean:        67.500 cm/s LVOT VTI:          0.174 m LVOT/AV VTI ratio: 1.01  AORTA Ao Root diam: 3.60 cm Ao Asc diam:  3.80 cm MITRAL VALVE  MV Area (PHT): 3.50 cm     SHUNTS MV Decel Time: 217 msec     Systemic VTI:  0.17 m MV E velocity: 63.00 cm/s   Systemic Diam: 2.30 cm MV A velocity: 126.00 cm/s MV E/A ratio:  0.50 Mihai Croitoru MD Electronically signed by Jerel Balding MD Signature Date/Time: 07/19/2024/4:11:12 PM    Final    DG Hip Unilat W or Wo Pelvis 2-3 Views Left Result Date: 07/18/2024 CLINICAL DATA:  Fall, pain. EXAM: DG HIP (WITH OR WITHOUT PELVIS) 2-3V LEFT COMPARISON:  None Available. FINDINGS: No fracture or dislocation. Hip joint space is maintained bilaterally. Minimal acetabular osteophytosis bilaterally.  Degenerative changes in the spine and sacroiliac joints. IMPRESSION: 1. No acute findings. 2. Minimal acetabular osteophytosis bilaterally. 3. Degenerative changes in the spine. Electronically Signed   By: Newell Eke M.D.   On: 07/18/2024 12:56   DG Ankle Complete Left Result Date: 07/18/2024 CLINICAL DATA:  Fall with pain and swelling. EXAM: LEFT ANKLE COMPLETE - 3+ VIEW COMPARISON:  None Available. FINDINGS: Diffuse soft tissue swelling.  No fracture or dislocation. IMPRESSION: Soft tissue swelling without fracture. Electronically Signed   By: Newell Eke M.D.   On: 07/18/2024 12:56     Subjective: No acute issues or events overnight denies nausea vomiting diarrhea constipation icterus chills chest pain   Discharge Exam: Vitals:   07/22/24 1036 07/22/24 1153  BP:  (!) 156/86  Pulse:  79  Resp:  19  Temp:  98.2 F (36.8 C)  SpO2: 98% 100%   Vitals:   07/22/24 0627 07/22/24 0725 07/22/24 1036 07/22/24 1153  BP: (!) 153/88   (!) 156/86  Pulse: 77   79  Resp: 19   19  Temp: (!) 97.5 F (36.4 C)   98.2 F (36.8 C)  TempSrc: Oral   Oral  SpO2: 100%  98% 100%  Weight:  111.9 kg    Height:        General: Pt is alert, awake, not in acute distress Cardiovascular: RRR, S1/S2 +, no rubs, no gallops Respiratory: CTA bilaterally, no wheezing, no rhonchi Abdominal: Soft, NT, ND, bowel sounds + Extremities: no edema, no cyanosis    The results of significant diagnostics from this hospitalization (including imaging, microbiology, ancillary and laboratory) are listed below for reference.     Microbiology: Recent Results (from the past 240 hours)  Urine Culture     Status: Abnormal   Collection Time: 07/18/24 11:27 AM   Specimen: Urine, Random  Result Value Ref Range Status   Specimen Description   Final    URINE, RANDOM Performed at [facility], [address] [place], [place] [address]    Special Requests   Final    NONE Reflexed from  [id number] Performed at [facility], [address] [place], [place] [address]    Culture (A)  Final    >=100,000 COLONIES/mL MORGANELLA MORGANII 80,000 COLONIES/mL PSEUDOMONAS AERUGINOSA    Report Status 07/22/2024 FINAL  Final   Organism ID, Bacteria MORGANELLA MORGANII (A)  Final   Organism ID, Bacteria PSEUDOMONAS AERUGINOSA (A)  Final      Susceptibility   Morganella morganii - MIC*    AMPICILLIN  >=32 RESISTANT Resistant     ERTAPENEM <=0.12 SENSITIVE Sensitive     CIPROFLOXACIN >=4 RESISTANT Resistant     GENTAMICIN <=1 SENSITIVE Sensitive     NITROFURANTOIN RESISTANT Resistant     TRIMETH/SULFA >=320 RESISTANT Resistant     AMPICILLIN /SULBACTAM >=32 RESISTANT  Resistant     PIP/TAZO Value in next row Sensitive      <=4 SENSITIVEThis is a modified FDA-approved test that has been validated and its performance characteristics determined by the reporting laboratory.  This laboratory is certified under the Clinical Laboratory Improvement Amendments CLIA as qualified to perform high complexity clinical laboratory testing.    MEROPENEM  Value in next row Sensitive      <=4 SENSITIVEThis is a modified FDA-approved test that has been validated and its performance characteristics determined by the reporting laboratory.  This laboratory is certified under the Clinical Laboratory Improvement Amendments CLIA as qualified to perform high complexity clinical laboratory testing.    * >=100,000 COLONIES/mL MORGANELLA MORGANII   Pseudomonas aeruginosa - MIC*    MEROPENEM  Value in next row Sensitive      <=4 SENSITIVEThis is a modified FDA-approved test that has been validated and its performance characteristics determined by the reporting laboratory.  This laboratory is certified under the Clinical Laboratory Improvement Amendments CLIA as qualified to perform high complexity clinical laboratory testing.    CIPROFLOXACIN Value in next row Sensitive      <=4 SENSITIVEThis is a modified  FDA-approved test that has been validated and its performance characteristics determined by the reporting laboratory.  This laboratory is certified under the Clinical Laboratory Improvement Amendments CLIA as qualified to perform high complexity clinical laboratory testing.    IMIPENEM Value in next row Sensitive      <=4 SENSITIVEThis is a modified FDA-approved test that has been validated and its performance characteristics determined by the reporting laboratory.  This laboratory is certified under the Clinical Laboratory Improvement Amendments CLIA as qualified to perform high complexity clinical laboratory testing.    PIP/TAZO Value in next row Sensitive      16 SENSITIVEThis is a modified FDA-approved test that has been validated and its performance characteristics determined by the reporting laboratory.  This laboratory is certified under the Clinical Laboratory Improvement Amendments CLIA as qualified to perform high complexity clinical laboratory testing.    CEFTAZIDIME/AVIBACTAM Value in next row Sensitive      16 SENSITIVEThis is a modified FDA-approved test that has been validated and its performance characteristics determined by the reporting laboratory.  This laboratory is certified under the Clinical Laboratory Improvement Amendments CLIA as qualified to perform high complexity clinical laboratory testing.    CEFTOLOZANE/TAZOBACTAM Value in next row Sensitive      16 SENSITIVEThis is a modified FDA-approved test that has been validated and its performance characteristics determined by the reporting laboratory.  This laboratory is certified under the Clinical Laboratory Improvement Amendments CLIA as qualified to perform high complexity clinical laboratory testing.    TOBRAMYCIN Value in next row Sensitive      16 SENSITIVEThis is a modified FDA-approved test that has been validated and its performance characteristics determined by the reporting laboratory.  This laboratory is certified under  the Clinical Laboratory Improvement Amendments CLIA as qualified to perform high complexity clinical laboratory testing.    CEFTAZIDIME Value in next row Sensitive      16 SENSITIVEThis is a modified FDA-approved test that has been validated and its performance characteristics determined by the reporting laboratory.  This laboratory is certified under the Clinical Laboratory Improvement Amendments CLIA as qualified to perform high complexity clinical laboratory testing.    * 80,000 COLONIES/mL PSEUDOMONAS AERUGINOSA  MRSA Next Gen by PCR, Nasal     Status: Abnormal   Collection Time: 07/19/24  2:56 PM   Specimen:  Nasal Mucosa; Nasal Swab  Result Value Ref Range Status   MRSA by PCR Next Gen DETECTED (A) NOT DETECTED Final    Comment: (NOTE) The GeneXpert MRSA Assay (FDA approved for NASAL specimens only), is one component of a comprehensive MRSA colonization surveillance program. It is not intended to diagnose MRSA infection nor to guide or monitor treatment for MRSA infections. Test performance is not FDA approved in patients less than 17 years old. Performed at [facility], [address], [place], [place] [address]      Labs: BNP (last 3 results) No results for input(s): BNP in the last 8760 hours. Basic Metabolic Panel: Recent Labs  Lab 07/18/24 1101 07/19/24 0414 07/20/24 0422 07/21/24 0415 07/21/24 1538 07/22/24 0402  NA 137 138 141 141  --  140  K 4.1 3.9 3.9 4.1  --  4.3  CL 100 101 107 105  --  103  CO2 22 23 21* 24  --  27  GLUCOSE 110* 117* 200* 217*  --  162*  BUN 83* 82* 84* 86*  --  86*  CREATININE 4.24* 4.13* 4.24* 4.56*  --  5.08*  CALCIUM  9.8 9.8 9.1 8.9  --  9.2  MG  --  2.9*  --   --  2.5*  --    Liver Function Tests: Recent Labs  Lab 07/18/24 1101 07/19/24 0414  AST 40 32  ALT 13 14  ALKPHOS 81 76  BILITOT 0.6 0.3  PROT 7.9 7.8  ALBUMIN 4.1 4.0   No results for input(s): LIPASE, AMYLASE in the last 168 hours. No  results for input(s): AMMONIA in the last 168 hours. CBC: Recent Labs  Lab 07/18/24 1101 07/19/24 0414 07/20/24 0422 07/21/24 0415 07/22/24 0402  WBC 7.0 5.1 5.6 6.2 6.0  NEUTROABS 5.6  --   --   --   --   HGB 13.2 13.0 11.8* 11.7* 11.6*  HCT 41.7 41.1 40.1 38.7* 37.6*  MCV 89.5 89.2 92.2 91.3 92.2  PLT 247 243 217 228 224   Cardiac Enzymes: Recent Labs  Lab 07/18/24 1101  CKTOTAL 730*   BNP: Invalid input(s): POCBNP CBG: Recent Labs  Lab 07/21/24 1127 07/21/24 1614 07/21/24 2219 07/22/24 0717 07/22/24 1150  GLUCAP 197* 103* 158* 176* 178*   D-Dimer No results for input(s): DDIMER in the last 72 hours. Hgb A1c No results for input(s): HGBA1C in the last 72 hours. Lipid Profile Recent Labs    07/20/24 0422  CHOL 142  HDL 73  LDLCALC 48  TRIG 108  CHOLHDL 2.0   Thyroid  function studies No results for input(s): TSH, T4TOTAL, T3FREE, THYROIDAB in the last 72 hours.  Invalid input(s): FREET3 Anemia work up No results for input(s): VITAMINB12, FOLATE, FERRITIN, TIBC, IRON, RETICCTPCT in the last 72 hours. Urinalysis    Component Value Date/Time   COLORURINE YELLOW 07/18/2024 1127   APPEARANCEUR CLOUDY (A) 07/18/2024 1127   LABSPEC 1.013 07/18/2024 1127   PHURINE 8.0 07/18/2024 1127   GLUCOSEU NEGATIVE 07/18/2024 1127   HGBUR SMALL (A) 07/18/2024 1127   BILIRUBINUR NEGATIVE 07/18/2024 1127   KETONESUR NEGATIVE 07/18/2024 1127   PROTEINUR >=300 (A) 07/18/2024 1127   UROBILINOGEN 0.2 03/11/2012 2012   NITRITE NEGATIVE 07/18/2024 1127   LEUKOCYTESUR LARGE (A) 07/18/2024 1127   Sepsis Labs Recent Labs  Lab 07/19/24 0414 07/20/24 0422 07/21/24 0415 07/22/24 0402  WBC 5.1 5.6 6.2 6.0   Microbiology Recent Results (from the past 240 hours)  Urine Culture  Status: Abnormal   Collection Time: 07/18/24 11:27 AM   Specimen: Urine, Random  Result Value Ref Range Status   Specimen Description   Final    URINE,  RANDOM Performed at [facility], [address], [place], [place] [address]    Special Requests   Final    NONE Reflexed from [id number] Performed at [facility], [address] [place], [place] [address]    Culture (A)  Final    >=100,000 COLONIES/mL MORGANELLA MORGANII 80,000 COLONIES/mL PSEUDOMONAS AERUGINOSA    Report Status 07/22/2024 FINAL  Final   Organism ID, Bacteria MORGANELLA MORGANII (A)  Final   Organism ID, Bacteria PSEUDOMONAS AERUGINOSA (A)  Final      Susceptibility   Morganella morganii - MIC*    AMPICILLIN  >=32 RESISTANT Resistant     ERTAPENEM <=0.12 SENSITIVE Sensitive     CIPROFLOXACIN >=4 RESISTANT Resistant     GENTAMICIN <=1 SENSITIVE Sensitive     NITROFURANTOIN RESISTANT Resistant     TRIMETH/SULFA >=320 RESISTANT Resistant     AMPICILLIN /SULBACTAM >=32 RESISTANT Resistant     PIP/TAZO Value in next row Sensitive      <=4 SENSITIVEThis is a modified FDA-approved test that has been validated and its performance characteristics determined by the reporting laboratory.  This laboratory is certified under the Clinical Laboratory Improvement Amendments CLIA as qualified to perform high complexity clinical laboratory testing.    MEROPENEM  Value in next row Sensitive      <=4 SENSITIVEThis is a modified FDA-approved test that has been validated and its performance characteristics determined by the reporting laboratory.  This laboratory is certified under the Clinical Laboratory Improvement Amendments CLIA as qualified to perform high complexity clinical laboratory testing.    * >=100,000 COLONIES/mL MORGANELLA MORGANII   Pseudomonas aeruginosa - MIC*    MEROPENEM  Value in next row Sensitive      <=4 SENSITIVEThis is a modified FDA-approved test that has been validated and its performance characteristics determined by the reporting laboratory.  This laboratory is certified under the Clinical Laboratory Improvement Amendments CLIA as  qualified to perform high complexity clinical laboratory testing.    CIPROFLOXACIN Value in next row Sensitive      <=4 SENSITIVEThis is a modified FDA-approved test that has been validated and its performance characteristics determined by the reporting laboratory.  This laboratory is certified under the Clinical Laboratory Improvement Amendments CLIA as qualified to perform high complexity clinical laboratory testing.    IMIPENEM Value in next row Sensitive      <=4 SENSITIVEThis is a modified FDA-approved test that has been validated and its performance characteristics determined by the reporting laboratory.  This laboratory is certified under the Clinical Laboratory Improvement Amendments CLIA as qualified to perform high complexity clinical laboratory testing.    PIP/TAZO Value in next row Sensitive      16 SENSITIVEThis is a modified FDA-approved test that has been validated and its performance characteristics determined by the reporting laboratory.  This laboratory is certified under the Clinical Laboratory Improvement Amendments CLIA as qualified to perform high complexity clinical laboratory testing.    CEFTAZIDIME/AVIBACTAM Value in next row Sensitive      16 SENSITIVEThis is a modified FDA-approved test that has been validated and its performance characteristics determined by the reporting laboratory.  This laboratory is certified under the Clinical Laboratory Improvement Amendments CLIA as qualified to perform high complexity clinical laboratory testing.    CEFTOLOZANE/TAZOBACTAM Value in next row Sensitive  16 SENSITIVEThis is a modified FDA-approved test that has been validated and its performance characteristics determined by the reporting laboratory.  This laboratory is certified under the Clinical Laboratory Improvement Amendments CLIA as qualified to perform high complexity clinical laboratory testing.    TOBRAMYCIN Value in next row Sensitive      16 SENSITIVEThis is a modified  FDA-approved test that has been validated and its performance characteristics determined by the reporting laboratory.  This laboratory is certified under the Clinical Laboratory Improvement Amendments CLIA as qualified to perform high complexity clinical laboratory testing.    CEFTAZIDIME Value in next row Sensitive      16 SENSITIVEThis is a modified FDA-approved test that has been validated and its performance characteristics determined by the reporting laboratory.  This laboratory is certified under the Clinical Laboratory Improvement Amendments CLIA as qualified to perform high complexity clinical laboratory testing.    * 80,000 COLONIES/mL PSEUDOMONAS AERUGINOSA  MRSA Next Gen by PCR, Nasal     Status: Abnormal   Collection Time: 07/19/24  2:56 PM   Specimen: Nasal Mucosa; Nasal Swab  Result Value Ref Range Status   MRSA by PCR Next Gen DETECTED (A) NOT DETECTED Final    Comment: (NOTE) The GeneXpert MRSA Assay (FDA approved for NASAL specimens only), is one component of a comprehensive MRSA colonization surveillance program. It is not intended to diagnose MRSA infection nor to guide or monitor treatment for MRSA infections. Test performance is not FDA approved in patients less than 59 years old. Performed at [facility], [address], [place], [place] [address]      Time coordinating discharge: Over 30 minutes  SIGNED:   Elsie JAYSON Montclair, DO [facility] 07/22/2024, 12:16 PM Pager   If 7PM-7AM, please contact night-coverage [email or web address]

## 2024-08-10 NOTE — Progress Notes (Deleted)
 Cardiology Office Note    Date:  08/10/2024  ID:  Tim Bradley, Tim Bradley 1951/02/28, MRN [id number] PCP:  [facility]  Cardiologist:  Maude Emmer, MD  Electrophysiologist:  None   Chief Complaint: ***  History of Present Illness: .    Tim Bradley is a 73 y.o. male with visit-pertinent history of chronic systolic heart failure, hypertension, GERD, chronic anemia, CKD stage IV, polysubstance use (cocaine ETOH), lumbar DDD, type 2 diabetes, BPH, seizure disorder, history of DVT, prostate cancer and lumbar spinal stenosis.  Patient was diagnosed with acute systolic heart failure and seen by Dr. Bobie in 2021.  At that time patient had been admitted for peripheral edema and orthopnea.  UDS positive for cocaine.  Echo showed LVEF less than 20% with moderate concentric LVH.  G1 DD.  He was started on GDMT though.  He had multiple rehospitalization's for heart failure exacerbations and then was lost to follow-up.  Patient was last seen by cardiology office in 2023 when he was admitted to [facility] with significant volume overload.  At that time his diuresis and Coreg  were increased and he is recommended to follow-up outpatient however does not appear that he followed up.  Patient has chronic left bundle branch block since 2019 when admitted for a GI bleed.  Patient presented the ED on 10/20 after a fall.  He remained on the ground for almost 24 hours prior to rescue.  At that time he reported leg pain and edema.  He reported the ED that he had been compliant with his diuretic but was unable to take it when he was on the floor.  Patient was restarted on Eliquis , Coreg  anesthesia medications.  He received IV Lasix  in the ED.  It was noted that patient does not walk, uses a power wheelchair to get around.  Patient reported that he sees cardiology at the [facility] and had a visit a month prior however this was unable to be reviewed in care everywhere.  It was noted patient is a poor historian.  Patient noted to  have AKI, proBNP 11,553 on admission, was overtly volume overloaded on exam.  Patient not been taking his medications at home.  Echo during admission indicated LVEF 25 to 30% with global hypokinesis and moderate concentric LVH, low normal RV function.    Chronic HF: Patient with history of HFrEF and hypertension in setting of nonadherence.  Recently admitted in setting of medication noncompliance and substance use, LVEF 25 to 30% with global hypokinesis and moderate concentric LVH. ARB/ARNI/MRA/SGLT2 inhibitor contraindicated with renal function Hypertension: Hyperlipidemia: History of DVT:  Labwork independently reviewed:   ROS: .   *** denies chest pain, shortness of breath, lower extremity edema, fatigue, palpitations, melena, hematuria, hemoptysis, diaphoresis, weakness, presyncope, syncope, orthopnea, and PND.  All other systems are reviewed and otherwise negative.  Studies Reviewed: SABRA    EKG:  EKG is ordered today, personally reviewed, demonstrating ***     CV Studies: Cardiac studies reviewed are outlined and summarized above. Otherwise please see EMR for full report. Cardiac Studies & Procedures   ______________________________________________________________________________________________   STRESS TESTS  NM MYOCAR MULTI W/SPECT W 05/07/2020  Narrative  There was no ST segment deviation noted during stress.  No T wave inversion was noted during stress.  Findings consistent with prior myocardial infarction with peri-infarct ischemia.  The left ventricular ejection fraction is severely decreased (<30%).  No prior study for comparison.  There is significant perfusion defect of the entire anterior  and inferior wall that is slightly worse at stress. However, does not appear to be severely abnormal, and distribution does not explain global hypokinesis. Likely infarct with small amount of worsening ischemia with stress, but cardiomyopathy out of proportion to  ischemia/infarction.   ECHOCARDIOGRAM  ECHOCARDIOGRAM COMPLETE 07/19/2024  Narrative ECHOCARDIOGRAM REPORT    Patient Name:   Tim Bradley Date of Exam: 07/19/2024 Medical Rec #:  [id number]     Height:       74.0 in Accession #:    [id number]    Weight:       247.1 lb Date of Birth:  24-Nov-1950     BSA:          2.378 m Patient Age:    73 years      BP:           133/87 mmHg Patient Gender: M             HR:           75 bpm. Exam Location:  Inpatient  Procedure: 2D Echo, Cardiac Doppler, Color Doppler and Intracardiac Opacification Agent (Both Spectral and Color Flow Doppler were utilized during procedure).  Indications:    CHF-Acute Systolic I50.21  History:        Patient has prior history of Echocardiogram examinations, most recent 01/26/2022. CHF; Risk Factors:Diabetes and Hypertension.  Sonographer:    Jayson Gaskins Referring Phys: [id number] DAVID MANUEL ORTIZ  IMPRESSIONS   1. No left ventricular thrombus is seen (Definity  contrast was used). There is adverse spherical remodeling of the left ventricle and severe systolic dyssynchrony. Left ventricular ejection fraction, by estimation, is 25 to 30%. The left ventricle has severely decreased function. The left ventricle demonstrates global hypokinesis. The left ventricular internal cavity size was moderately to severely dilated. There is moderate concentric left ventricular hypertrophy. Indeterminate diastolic filling due to E-A fusion. 2. Right ventricular systolic function is low normal. The right ventricular size is normal. 3. The mitral valve is degenerative. Trivial mitral valve regurgitation. 4. The aortic valve is tricuspid. Aortic valve regurgitation is not visualized. 5. Aortic dilatation noted. There is mild dilatation of the ascending aorta, measuring 39 mm.  Comparison(s): Prior images reviewed side by side. The left ventricular function is significantly worse.  FINDINGS Left Ventricle: No left ventricular  thrombus is seen (Definity  contrast was used). There is adverse spherical remodeling of the left ventricle and severe systolic dyssynchrony. Left ventricular ejection fraction, by estimation, is 25 to 30%. The left ventricle has severely decreased function. The left ventricle demonstrates global hypokinesis. Definity  contrast agent was given IV to delineate the left ventricular endocardial borders. The left ventricular internal cavity size was moderately to severely dilated. There is moderate concentric left ventricular hypertrophy. Abnormal (paradoxical) septal motion, consistent with left bundle branch block. Indeterminate diastolic filling due to E-A fusion.  Right Ventricle: The right ventricular size is normal. No increase in right ventricular wall thickness. Right ventricular systolic function is low normal.  Left Atrium: Left atrial size was normal in size.  Right Atrium: Right atrial size was normal in size.  Pericardium: There is no evidence of pericardial effusion.  Mitral Valve: The mitral valve is degenerative in appearance. Trivial mitral valve regurgitation.  Tricuspid Valve: The tricuspid valve is grossly normal. Tricuspid valve regurgitation is trivial.  Aortic Valve: The aortic valve is tricuspid. Aortic valve regurgitation is not visualized. Aortic valve mean gradient measures 3.0 mmHg. Aortic valve peak gradient measures 5.7 mmHg. Aortic valve area,  by VTI measures 4.18 cm.  Pulmonic Valve: The pulmonic valve was grossly normal. Pulmonic valve regurgitation is trivial. No evidence of pulmonic stenosis.  Aorta: Aortic dilatation noted and the aortic root is normal in size and structure. There is mild dilatation of the ascending aorta, measuring 39 mm.  Venous: The inferior vena cava was not well visualized.  IAS/Shunts: No atrial level shunt detected by color flow Doppler.   LEFT VENTRICLE PLAX 2D LVIDd:         6.40 cm LVIDs:         5.67 cm LV PW:         1.40  cm LV IVS:        1.60 cm LVOT diam:     2.30 cm LV SV:         72 LV SV Index:   30 LVOT Area:     4.15 cm   RIGHT VENTRICLE RV S prime:     17.10 cm/s TAPSE (M-mode): 2.9 cm  LEFT ATRIUM             Index        RIGHT ATRIUM           Index LA Vol (A2C):   35.7 ml 15.01 ml/m  RA Area:     16.80 cm LA Vol (A4C):   37.6 ml 15.81 ml/m  RA Volume:   39.10 ml  16.44 ml/m LA Biplane Vol: 38.7 ml 16.27 ml/m AORTIC VALVE AV Area (Vmax):    3.49 cm AV Area (Vmean):   3.24 cm AV Area (VTI):     4.18 cm AV Vmax:           119.00 cm/s AV Vmean:          86.600 cm/s AV VTI:            0.173 m AV Peak Grad:      5.7 mmHg AV Mean Grad:      3.0 mmHg LVOT Vmax:         99.90 cm/s LVOT Vmean:        67.500 cm/s LVOT VTI:          0.174 m LVOT/AV VTI ratio: 1.01  AORTA Ao Root diam: 3.60 cm Ao Asc diam:  3.80 cm  MITRAL VALVE MV Area (PHT): 3.50 cm     SHUNTS MV Decel Time: 217 msec     Systemic VTI:  0.17 m MV E velocity: 63.00 cm/s   Systemic Diam: 2.30 cm MV A velocity: 126.00 cm/s MV E/A ratio:  0.50  Mihai Croitoru MD Electronically signed by Jerel Balding MD Signature Date/Time: 07/19/2024/4:11:12 PM    Final          ______________________________________________________________________________________________       Current Reported Medications:.    No outpatient medications have been marked as taking for the 08/12/24 encounter (Appointment) with Meara Wiechman D, NP.    Physical Exam:    VS:  There were no vitals taken for this visit.   Wt Readings from Last 3 Encounters:  07/22/24 246 lb 11.1 oz (111.9 kg)  07/01/22 266 lb 12.1 oz (121 kg)  05/26/22 267 lb 6.7 oz (121.3 kg)    GEN: Well nourished, well developed in no acute distress NECK: No JVD; No carotid bruits CARDIAC: ***RRR, no murmurs, rubs, gallops RESPIRATORY:  Clear to auscultation without rales, wheezing or rhonchi  ABDOMEN: Soft, non-tender, non-distended EXTREMITIES:  No edema;  No acute deformity  Asessement and Plan:.     ***     Disposition: F/u with ***  Signed, Sebasthian Stailey D Allecia Bells, NP

## 2024-08-12 ENCOUNTER — Ambulatory Visit: Attending: Cardiology | Admitting: Cardiology

## 2024-08-12 DIAGNOSIS — E782 Mixed hyperlipidemia: Secondary | ICD-10-CM

## 2024-08-12 DIAGNOSIS — I825Y1 Chronic embolism and thrombosis of unspecified deep veins of right proximal lower extremity: Secondary | ICD-10-CM

## 2024-08-12 DIAGNOSIS — I10 Essential (primary) hypertension: Secondary | ICD-10-CM

## 2024-08-12 DIAGNOSIS — I5022 Chronic systolic (congestive) heart failure: Secondary | ICD-10-CM
